# Patient Record
Sex: Male | Born: 1935 | Race: White | Hispanic: No | Marital: Married | Smoking: Former smoker
Health system: Southern US, Community
[De-identification: ages and names within clinical notes are randomized; demographics above are authoritative.]

## PROBLEM LIST (undated history)

## (undated) DIAGNOSIS — C3492 Malignant neoplasm of unspecified part of left bronchus or lung: Secondary | ICD-10-CM

## (undated) DIAGNOSIS — F0280 Dementia in other diseases classified elsewhere without behavioral disturbance: Secondary | ICD-10-CM

## (undated) DIAGNOSIS — H3530 Unspecified macular degeneration: Secondary | ICD-10-CM

## (undated) DIAGNOSIS — N183 Chronic kidney disease, stage 3 (moderate): Secondary | ICD-10-CM

## (undated) DIAGNOSIS — I319 Disease of pericardium, unspecified: Secondary | ICD-10-CM

## (undated) DIAGNOSIS — F17201 Nicotine dependence, unspecified, in remission: Secondary | ICD-10-CM

## (undated) DIAGNOSIS — Z86010 Personal history of colonic polyps: Secondary | ICD-10-CM

## (undated) DIAGNOSIS — I10 Essential (primary) hypertension: Secondary | ICD-10-CM

## (undated) DIAGNOSIS — F411 Generalized anxiety disorder: Secondary | ICD-10-CM

## (undated) DIAGNOSIS — G309 Alzheimer's disease, unspecified: Secondary | ICD-10-CM

## (undated) DIAGNOSIS — E1159 Type 2 diabetes mellitus with other circulatory complications: Secondary | ICD-10-CM

## (undated) DIAGNOSIS — E1122 Type 2 diabetes mellitus with diabetic chronic kidney disease: Secondary | ICD-10-CM

## (undated) DIAGNOSIS — C4490 Unspecified malignant neoplasm of skin, unspecified: Secondary | ICD-10-CM

## (undated) DIAGNOSIS — C4492 Squamous cell carcinoma of skin, unspecified: Secondary | ICD-10-CM

## (undated) DIAGNOSIS — C61 Malignant neoplasm of prostate: Secondary | ICD-10-CM

## (undated) DIAGNOSIS — I2510 Atherosclerotic heart disease of native coronary artery without angina pectoris: Secondary | ICD-10-CM

## (undated) DIAGNOSIS — I129 Hypertensive chronic kidney disease with stage 1 through stage 4 chronic kidney disease, or unspecified chronic kidney disease: Secondary | ICD-10-CM

## (undated) HISTORY — DX: Personal history of colonic polyps: Z86.010

## (undated) HISTORY — DX: Type 2 diabetes mellitus with diabetic chronic kidney disease: E11.22

## (undated) HISTORY — DX: Dementia in other diseases classified elsewhere, unspecified severity, without behavioral disturbance, psychotic disturbance, mood disturbance, and anxiety: F02.80

## (undated) HISTORY — PX: TONSILLECTOMY: SUR1361

## (undated) HISTORY — DX: Hypertensive chronic kidney disease with stage 1 through stage 4 chronic kidney disease, or unspecified chronic kidney disease: I12.9

## (undated) HISTORY — DX: Chronic kidney disease, stage 3 (moderate): N18.3

## (undated) HISTORY — DX: Generalized anxiety disorder: F41.1

## (undated) HISTORY — DX: Essential (primary) hypertension: I10

## (undated) HISTORY — DX: Unspecified malignant neoplasm of skin, unspecified: C44.90

## (undated) HISTORY — DX: Type 2 diabetes mellitus with other circulatory complications: E11.59

## (undated) HISTORY — DX: Unspecified macular degeneration: H35.30

## (undated) HISTORY — DX: Squamous cell carcinoma of skin, unspecified: C44.92

## (undated) HISTORY — DX: Malignant neoplasm of unspecified part of left bronchus or lung: C34.92

## (undated) HISTORY — PX: HERNIA REPAIR: SHX51

## (undated) HISTORY — DX: Nicotine dependence, unspecified, in remission: F17.201

## (undated) HISTORY — DX: Malignant neoplasm of prostate: C61

## (undated) HISTORY — DX: Alzheimer's disease, unspecified: G30.9

---

## 1999-04-05 DIAGNOSIS — C3492 Malignant neoplasm of unspecified part of left bronchus or lung: Secondary | ICD-10-CM

## 1999-04-05 HISTORY — DX: Malignant neoplasm of unspecified part of left bronchus or lung: C34.92

## 1999-04-05 HISTORY — PX: LOBECTOMY: SHX5089

## 1999-11-25 ENCOUNTER — Encounter: Payer: Self-pay | Admitting: Family Medicine

## 1999-11-25 ENCOUNTER — Encounter: Admission: RE | Admit: 1999-11-25 | Discharge: 1999-11-25 | Payer: Self-pay | Admitting: Family Medicine

## 1999-11-26 ENCOUNTER — Encounter: Payer: Self-pay | Admitting: Family Medicine

## 1999-11-26 ENCOUNTER — Encounter: Admission: RE | Admit: 1999-11-26 | Discharge: 1999-11-26 | Payer: Self-pay | Admitting: Family Medicine

## 1999-11-29 ENCOUNTER — Ambulatory Visit (HOSPITAL_COMMUNITY): Admission: RE | Admit: 1999-11-29 | Discharge: 1999-11-29 | Payer: Self-pay | Admitting: Gastroenterology

## 1999-11-29 ENCOUNTER — Encounter (INDEPENDENT_AMBULATORY_CARE_PROVIDER_SITE_OTHER): Payer: Self-pay | Admitting: *Deleted

## 1999-12-01 ENCOUNTER — Encounter: Admission: RE | Admit: 1999-12-01 | Discharge: 1999-12-01 | Payer: Self-pay | Admitting: Family Medicine

## 1999-12-01 ENCOUNTER — Encounter: Payer: Self-pay | Admitting: Family Medicine

## 1999-12-14 ENCOUNTER — Encounter (INDEPENDENT_AMBULATORY_CARE_PROVIDER_SITE_OTHER): Payer: Self-pay

## 1999-12-14 ENCOUNTER — Ambulatory Visit: Admission: RE | Admit: 1999-12-14 | Discharge: 1999-12-14 | Payer: Self-pay | Admitting: Internal Medicine

## 1999-12-21 ENCOUNTER — Encounter: Payer: Self-pay | Admitting: Family Medicine

## 1999-12-30 ENCOUNTER — Encounter: Payer: Self-pay | Admitting: Thoracic Surgery

## 1999-12-31 ENCOUNTER — Ambulatory Visit (HOSPITAL_COMMUNITY): Admission: RE | Admit: 1999-12-31 | Discharge: 1999-12-31 | Payer: Self-pay | Admitting: Thoracic Surgery

## 1999-12-31 ENCOUNTER — Encounter (INDEPENDENT_AMBULATORY_CARE_PROVIDER_SITE_OTHER): Payer: Self-pay | Admitting: *Deleted

## 2000-01-03 ENCOUNTER — Encounter: Payer: Self-pay | Admitting: Thoracic Surgery

## 2000-01-03 ENCOUNTER — Inpatient Hospital Stay (HOSPITAL_COMMUNITY): Admission: RE | Admit: 2000-01-03 | Discharge: 2000-01-10 | Payer: Self-pay | Admitting: Thoracic Surgery

## 2000-01-04 ENCOUNTER — Encounter: Payer: Self-pay | Admitting: Thoracic Surgery

## 2000-01-05 ENCOUNTER — Encounter: Payer: Self-pay | Admitting: Thoracic Surgery

## 2000-01-06 ENCOUNTER — Encounter: Payer: Self-pay | Admitting: Thoracic Surgery

## 2000-01-07 ENCOUNTER — Encounter: Payer: Self-pay | Admitting: Thoracic Surgery

## 2000-01-08 ENCOUNTER — Encounter: Payer: Self-pay | Admitting: Thoracic Surgery

## 2000-01-10 ENCOUNTER — Encounter: Payer: Self-pay | Admitting: Thoracic Surgery

## 2000-01-28 ENCOUNTER — Encounter: Payer: Self-pay | Admitting: Thoracic Surgery

## 2000-01-28 ENCOUNTER — Encounter: Admission: RE | Admit: 2000-01-28 | Discharge: 2000-01-28 | Payer: Self-pay | Admitting: Thoracic Surgery

## 2000-04-12 ENCOUNTER — Encounter: Payer: Self-pay | Admitting: Thoracic Surgery

## 2000-04-12 ENCOUNTER — Encounter: Admission: RE | Admit: 2000-04-12 | Discharge: 2000-04-12 | Payer: Self-pay | Admitting: Thoracic Surgery

## 2000-06-07 ENCOUNTER — Encounter: Admission: RE | Admit: 2000-06-07 | Discharge: 2000-06-07 | Payer: Self-pay | Admitting: Oncology

## 2000-06-07 ENCOUNTER — Encounter: Payer: Self-pay | Admitting: Oncology

## 2000-06-13 ENCOUNTER — Encounter: Payer: Self-pay | Admitting: Oncology

## 2000-06-13 ENCOUNTER — Ambulatory Visit (HOSPITAL_COMMUNITY): Admission: RE | Admit: 2000-06-13 | Discharge: 2000-06-13 | Payer: Self-pay | Admitting: Oncology

## 2000-07-11 ENCOUNTER — Encounter: Payer: Self-pay | Admitting: Thoracic Surgery

## 2000-07-11 ENCOUNTER — Encounter: Admission: RE | Admit: 2000-07-11 | Discharge: 2000-07-11 | Payer: Self-pay | Admitting: Thoracic Surgery

## 2000-10-11 ENCOUNTER — Encounter: Payer: Self-pay | Admitting: Thoracic Surgery

## 2000-10-11 ENCOUNTER — Encounter: Admission: RE | Admit: 2000-10-11 | Discharge: 2000-10-11 | Payer: Self-pay | Admitting: Thoracic Surgery

## 2000-12-05 ENCOUNTER — Encounter: Admission: RE | Admit: 2000-12-05 | Discharge: 2000-12-05 | Payer: Self-pay | Admitting: Oncology

## 2000-12-05 ENCOUNTER — Encounter: Payer: Self-pay | Admitting: Oncology

## 2001-02-13 ENCOUNTER — Encounter: Admission: RE | Admit: 2001-02-13 | Discharge: 2001-02-13 | Payer: Self-pay | Admitting: Thoracic Surgery

## 2001-02-13 ENCOUNTER — Encounter: Payer: Self-pay | Admitting: Thoracic Surgery

## 2001-03-30 ENCOUNTER — Encounter: Payer: Self-pay | Admitting: Family Medicine

## 2001-04-04 DIAGNOSIS — C61 Malignant neoplasm of prostate: Secondary | ICD-10-CM

## 2001-04-04 HISTORY — DX: Malignant neoplasm of prostate: C61

## 2001-04-04 HISTORY — PX: PROSTATECTOMY: SHX69

## 2001-04-11 ENCOUNTER — Encounter (INDEPENDENT_AMBULATORY_CARE_PROVIDER_SITE_OTHER): Payer: Self-pay | Admitting: Specialist

## 2001-04-11 ENCOUNTER — Inpatient Hospital Stay (HOSPITAL_COMMUNITY): Admission: RE | Admit: 2001-04-11 | Discharge: 2001-04-14 | Payer: Self-pay | Admitting: Urology

## 2001-06-06 ENCOUNTER — Encounter: Payer: Self-pay | Admitting: Oncology

## 2001-06-06 ENCOUNTER — Encounter: Admission: RE | Admit: 2001-06-06 | Discharge: 2001-06-06 | Payer: Self-pay | Admitting: Oncology

## 2001-06-26 ENCOUNTER — Ambulatory Visit (HOSPITAL_BASED_OUTPATIENT_CLINIC_OR_DEPARTMENT_OTHER): Admission: RE | Admit: 2001-06-26 | Discharge: 2001-06-26 | Payer: Self-pay | Admitting: Surgery

## 2001-08-15 ENCOUNTER — Encounter: Admission: RE | Admit: 2001-08-15 | Discharge: 2001-08-15 | Payer: Self-pay | Admitting: Thoracic Surgery

## 2001-08-15 ENCOUNTER — Encounter: Payer: Self-pay | Admitting: Thoracic Surgery

## 2001-09-18 ENCOUNTER — Encounter: Payer: Self-pay | Admitting: Urology

## 2001-09-18 ENCOUNTER — Encounter: Payer: Self-pay | Admitting: Family Medicine

## 2001-09-18 ENCOUNTER — Ambulatory Visit (HOSPITAL_BASED_OUTPATIENT_CLINIC_OR_DEPARTMENT_OTHER): Admission: RE | Admit: 2001-09-18 | Discharge: 2001-09-18 | Payer: Self-pay | Admitting: Urology

## 2001-12-05 ENCOUNTER — Encounter: Admission: RE | Admit: 2001-12-05 | Discharge: 2001-12-05 | Payer: Self-pay | Admitting: Oncology

## 2001-12-05 ENCOUNTER — Encounter: Payer: Self-pay | Admitting: Oncology

## 2002-06-10 ENCOUNTER — Encounter: Admission: RE | Admit: 2002-06-10 | Discharge: 2002-06-10 | Payer: Self-pay | Admitting: Oncology

## 2002-06-10 ENCOUNTER — Encounter: Payer: Self-pay | Admitting: Oncology

## 2002-12-10 ENCOUNTER — Encounter: Payer: Self-pay | Admitting: Oncology

## 2002-12-10 ENCOUNTER — Encounter: Admission: RE | Admit: 2002-12-10 | Discharge: 2002-12-10 | Payer: Self-pay | Admitting: Oncology

## 2002-12-31 ENCOUNTER — Encounter: Payer: Self-pay | Admitting: Thoracic Surgery

## 2002-12-31 ENCOUNTER — Encounter: Admission: RE | Admit: 2002-12-31 | Discharge: 2002-12-31 | Payer: Self-pay | Admitting: Thoracic Surgery

## 2003-01-08 ENCOUNTER — Encounter: Admission: RE | Admit: 2003-01-08 | Discharge: 2003-01-08 | Payer: Self-pay | Admitting: Thoracic Surgery

## 2003-01-08 ENCOUNTER — Encounter: Payer: Self-pay | Admitting: Thoracic Surgery

## 2003-06-03 ENCOUNTER — Ambulatory Visit (HOSPITAL_COMMUNITY): Admission: RE | Admit: 2003-06-03 | Discharge: 2003-06-03 | Payer: Self-pay | Admitting: Oncology

## 2003-07-09 ENCOUNTER — Encounter: Admission: RE | Admit: 2003-07-09 | Discharge: 2003-07-09 | Payer: Self-pay | Admitting: Thoracic Surgery

## 2003-12-03 ENCOUNTER — Encounter: Admission: RE | Admit: 2003-12-03 | Discharge: 2003-12-03 | Payer: Self-pay | Admitting: Oncology

## 2004-02-05 ENCOUNTER — Encounter: Admission: RE | Admit: 2004-02-05 | Discharge: 2004-02-05 | Payer: Self-pay | Admitting: Thoracic Surgery

## 2004-02-16 ENCOUNTER — Ambulatory Visit: Payer: Self-pay | Admitting: Family Medicine

## 2004-06-07 ENCOUNTER — Ambulatory Visit: Payer: Self-pay | Admitting: Oncology

## 2004-06-08 ENCOUNTER — Encounter: Admission: RE | Admit: 2004-06-08 | Discharge: 2004-06-08 | Payer: Self-pay | Admitting: Oncology

## 2004-06-28 ENCOUNTER — Ambulatory Visit: Payer: Self-pay | Admitting: Family Medicine

## 2004-08-04 ENCOUNTER — Encounter: Admission: RE | Admit: 2004-08-04 | Discharge: 2004-08-04 | Payer: Self-pay | Admitting: Thoracic Surgery

## 2004-12-03 ENCOUNTER — Ambulatory Visit: Payer: Self-pay | Admitting: Oncology

## 2004-12-07 ENCOUNTER — Ambulatory Visit (HOSPITAL_COMMUNITY): Admission: RE | Admit: 2004-12-07 | Discharge: 2004-12-07 | Payer: Self-pay | Admitting: Oncology

## 2005-02-16 ENCOUNTER — Encounter: Admission: RE | Admit: 2005-02-16 | Discharge: 2005-02-16 | Payer: Self-pay | Admitting: Thoracic Surgery

## 2005-04-12 DIAGNOSIS — D099 Carcinoma in situ, unspecified: Secondary | ICD-10-CM

## 2005-04-12 DIAGNOSIS — C060 Malignant neoplasm of cheek mucosa: Secondary | ICD-10-CM

## 2005-04-12 DIAGNOSIS — C4492 Squamous cell carcinoma of skin, unspecified: Secondary | ICD-10-CM

## 2005-04-12 HISTORY — DX: Carcinoma in situ, unspecified: D09.9

## 2005-04-12 HISTORY — DX: Malignant neoplasm of cheek mucosa: C06.0

## 2005-04-12 HISTORY — DX: Squamous cell carcinoma of skin, unspecified: C44.92

## 2005-05-04 ENCOUNTER — Ambulatory Visit: Payer: Self-pay | Admitting: Family Medicine

## 2005-06-06 ENCOUNTER — Ambulatory Visit: Payer: Self-pay | Admitting: Oncology

## 2005-06-07 ENCOUNTER — Ambulatory Visit (HOSPITAL_COMMUNITY): Admission: RE | Admit: 2005-06-07 | Discharge: 2005-06-07 | Payer: Self-pay | Admitting: Oncology

## 2005-09-27 DIAGNOSIS — C4492 Squamous cell carcinoma of skin, unspecified: Secondary | ICD-10-CM

## 2005-09-27 HISTORY — DX: Squamous cell carcinoma of skin, unspecified: C44.92

## 2005-12-02 ENCOUNTER — Ambulatory Visit: Payer: Self-pay | Admitting: Oncology

## 2005-12-07 ENCOUNTER — Ambulatory Visit (HOSPITAL_COMMUNITY): Admission: RE | Admit: 2005-12-07 | Discharge: 2005-12-07 | Payer: Self-pay | Admitting: Oncology

## 2005-12-07 LAB — COMPREHENSIVE METABOLIC PANEL
ALT: 11 U/L (ref 0–40)
AST: 15 U/L (ref 0–37)
Albumin: 4 g/dL (ref 3.5–5.2)
Alkaline Phosphatase: 77 U/L (ref 39–117)
BUN: 18 mg/dL (ref 6–23)
CO2: 26 mEq/L (ref 19–32)
Calcium: 9.1 mg/dL (ref 8.4–10.5)
Chloride: 104 mEq/L (ref 96–112)
Creatinine, Ser: 1.35 mg/dL (ref 0.40–1.50)
Glucose, Bld: 127 mg/dL — ABNORMAL HIGH (ref 70–99)
Potassium: 4.3 mEq/L (ref 3.5–5.3)
Sodium: 138 mEq/L (ref 135–145)
Total Bilirubin: 0.5 mg/dL (ref 0.3–1.2)
Total Protein: 6.8 g/dL (ref 6.0–8.3)

## 2005-12-07 LAB — CBC WITH DIFFERENTIAL/PLATELET
BASO%: 0.4 % (ref 0.0–2.0)
Basophils Absolute: 0 10*3/uL (ref 0.0–0.1)
EOS%: 1.5 % (ref 0.0–7.0)
Eosinophils Absolute: 0.1 10*3/uL (ref 0.0–0.5)
HCT: 41.5 % (ref 38.7–49.9)
HGB: 14.4 g/dL (ref 13.0–17.1)
LYMPH%: 13.2 % — ABNORMAL LOW (ref 14.0–48.0)
MCH: 33 pg (ref 28.0–33.4)
MCHC: 34.7 g/dL (ref 32.0–35.9)
MCV: 95.3 fL (ref 81.6–98.0)
MONO#: 0.5 10*3/uL (ref 0.1–0.9)
MONO%: 6.5 % (ref 0.0–13.0)
NEUT#: 6.3 10*3/uL (ref 1.5–6.5)
NEUT%: 78.4 % — ABNORMAL HIGH (ref 40.0–75.0)
Platelets: 176 10*3/uL (ref 145–400)
RBC: 4.36 10*6/uL (ref 4.20–5.71)
RDW: 12.6 % (ref 11.2–14.6)
WBC: 8 10*3/uL (ref 4.0–10.0)
lymph#: 1.1 10*3/uL (ref 0.9–3.3)

## 2005-12-07 LAB — PSA: PSA: 0.04 ng/mL — ABNORMAL LOW (ref 0.10–4.00)

## 2005-12-07 LAB — LACTATE DEHYDROGENASE: LDH: 136 U/L (ref 94–250)

## 2005-12-13 ENCOUNTER — Encounter: Payer: Self-pay | Admitting: Family Medicine

## 2006-01-24 ENCOUNTER — Ambulatory Visit: Payer: Self-pay | Admitting: Family Medicine

## 2006-04-27 ENCOUNTER — Encounter: Payer: Self-pay | Admitting: Family Medicine

## 2006-04-27 LAB — HM COLONOSCOPY

## 2006-05-31 ENCOUNTER — Ambulatory Visit: Payer: Self-pay | Admitting: Family Medicine

## 2006-06-01 ENCOUNTER — Ambulatory Visit: Payer: Self-pay | Admitting: Oncology

## 2006-06-06 ENCOUNTER — Ambulatory Visit (HOSPITAL_COMMUNITY): Admission: RE | Admit: 2006-06-06 | Discharge: 2006-06-06 | Payer: Self-pay | Admitting: Oncology

## 2006-06-06 LAB — COMPREHENSIVE METABOLIC PANEL
ALT: 10 U/L (ref 0–53)
AST: 13 U/L (ref 0–37)
Albumin: 3.7 g/dL (ref 3.5–5.2)
Alkaline Phosphatase: 75 U/L (ref 39–117)
BUN: 13 mg/dL (ref 6–23)
CO2: 26 mEq/L (ref 19–32)
Calcium: 8.8 mg/dL (ref 8.4–10.5)
Chloride: 105 mEq/L (ref 96–112)
Creatinine, Ser: 1.19 mg/dL (ref 0.40–1.50)
Glucose, Bld: 133 mg/dL — ABNORMAL HIGH (ref 70–99)
Potassium: 4.1 mEq/L (ref 3.5–5.3)
Sodium: 141 mEq/L (ref 135–145)
Total Bilirubin: 0.3 mg/dL (ref 0.3–1.2)
Total Protein: 6.4 g/dL (ref 6.0–8.3)

## 2006-06-06 LAB — CBC WITH DIFFERENTIAL/PLATELET
BASO%: 0.5 % (ref 0.0–2.0)
Basophils Absolute: 0 10*3/uL (ref 0.0–0.1)
EOS%: 0.9 % (ref 0.0–7.0)
Eosinophils Absolute: 0 10*3/uL (ref 0.0–0.5)
HCT: 38.5 % — ABNORMAL LOW (ref 38.7–49.9)
HGB: 13.5 g/dL (ref 13.0–17.1)
LYMPH%: 18 % (ref 14.0–48.0)
MCH: 32.1 pg (ref 28.0–33.4)
MCHC: 35.1 g/dL (ref 32.0–35.9)
MCV: 91.6 fL (ref 81.6–98.0)
MONO#: 0.4 10*3/uL (ref 0.1–0.9)
MONO%: 8.1 % (ref 0.0–13.0)
NEUT#: 3.2 10*3/uL (ref 1.5–6.5)
NEUT%: 72.5 % (ref 40.0–75.0)
Platelets: 138 10*3/uL — ABNORMAL LOW (ref 145–400)
RBC: 4.2 10*6/uL (ref 4.20–5.71)
RDW: 12.7 % (ref 11.2–14.6)
WBC: 4.5 10*3/uL (ref 4.0–10.0)
lymph#: 0.8 10*3/uL — ABNORMAL LOW (ref 0.9–3.3)

## 2006-06-06 LAB — PSA: PSA: 0.03 ng/mL — ABNORMAL LOW (ref 0.10–4.00)

## 2006-06-06 LAB — LACTATE DEHYDROGENASE: LDH: 116 U/L (ref 94–250)

## 2007-01-08 ENCOUNTER — Ambulatory Visit: Payer: Self-pay | Admitting: Family Medicine

## 2007-01-08 DIAGNOSIS — Z85118 Personal history of other malignant neoplasm of bronchus and lung: Secondary | ICD-10-CM | POA: Insufficient documentation

## 2007-01-08 DIAGNOSIS — F411 Generalized anxiety disorder: Secondary | ICD-10-CM | POA: Insufficient documentation

## 2007-01-08 DIAGNOSIS — F0280 Dementia in other diseases classified elsewhere without behavioral disturbance: Secondary | ICD-10-CM | POA: Insufficient documentation

## 2007-01-08 DIAGNOSIS — I10 Essential (primary) hypertension: Secondary | ICD-10-CM | POA: Insufficient documentation

## 2007-01-08 DIAGNOSIS — G309 Alzheimer's disease, unspecified: Secondary | ICD-10-CM

## 2007-01-08 DIAGNOSIS — C4490 Unspecified malignant neoplasm of skin, unspecified: Secondary | ICD-10-CM | POA: Insufficient documentation

## 2007-01-08 DIAGNOSIS — L0390 Cellulitis, unspecified: Secondary | ICD-10-CM

## 2007-01-08 DIAGNOSIS — L0291 Cutaneous abscess, unspecified: Secondary | ICD-10-CM | POA: Insufficient documentation

## 2007-01-08 DIAGNOSIS — C61 Malignant neoplasm of prostate: Secondary | ICD-10-CM | POA: Insufficient documentation

## 2007-01-08 HISTORY — DX: Generalized anxiety disorder: F41.1

## 2007-04-09 ENCOUNTER — Ambulatory Visit: Payer: Self-pay | Admitting: Family Medicine

## 2007-04-17 ENCOUNTER — Ambulatory Visit: Payer: Self-pay | Admitting: Family Medicine

## 2007-04-18 ENCOUNTER — Encounter: Payer: Self-pay | Admitting: Family Medicine

## 2007-04-19 LAB — CONVERTED CEMR LAB
ALT: 15 units/L (ref 0–53)
AST: 15 units/L (ref 0–37)
Albumin: 3.7 g/dL (ref 3.5–5.2)
Alkaline Phosphatase: 72 units/L (ref 39–117)
BUN: 13 mg/dL (ref 6–23)
Bilirubin, Direct: 0.1 mg/dL (ref 0.0–0.3)
CO2: 33 meq/L — ABNORMAL HIGH (ref 19–32)
Calcium: 9.4 mg/dL (ref 8.4–10.5)
Chloride: 101 meq/L (ref 96–112)
Cholesterol: 242 mg/dL (ref 0–200)
Creatinine, Ser: 1.3 mg/dL (ref 0.4–1.5)
Direct LDL: 160.9 mg/dL
GFR calc Af Amer: 69 mL/min
GFR calc non Af Amer: 57 mL/min
Glucose, Bld: 172 mg/dL — ABNORMAL HIGH (ref 70–99)
HDL: 25.9 mg/dL — ABNORMAL LOW (ref >39.0)
Potassium: 4.8 meq/L (ref 3.5–5.1)
Sodium: 140 meq/L (ref 135–145)
Total Bilirubin: 1.2 mg/dL (ref 0.3–1.2)
Total CHOL/HDL Ratio: 9.3
Total Protein: 6.7 g/dL (ref 6.0–8.3)
Triglycerides: 303 mg/dL (ref 0–149)
VLDL: 61 mg/dL — ABNORMAL HIGH (ref 0–40)

## 2007-05-21 ENCOUNTER — Ambulatory Visit: Payer: Self-pay | Admitting: Family Medicine

## 2007-05-21 DIAGNOSIS — E7800 Pure hypercholesterolemia, unspecified: Secondary | ICD-10-CM | POA: Insufficient documentation

## 2007-05-21 DIAGNOSIS — E1159 Type 2 diabetes mellitus with other circulatory complications: Secondary | ICD-10-CM | POA: Insufficient documentation

## 2007-05-21 HISTORY — DX: Type 2 diabetes mellitus with other circulatory complications: E11.59

## 2007-05-21 LAB — CONVERTED CEMR LAB
Creatinine,U: 18.2 mg/dL
Hgb A1c MFr Bld: 6.8 % — ABNORMAL HIGH (ref 4.6–6.0)
Microalb Creat Ratio: 33 mg/g — ABNORMAL HIGH (ref 0.0–30.0)
Microalb, Ur: 0.6 mg/dL (ref 0.0–1.9)

## 2007-06-14 ENCOUNTER — Ambulatory Visit: Payer: Self-pay | Admitting: Oncology

## 2007-06-18 ENCOUNTER — Encounter: Payer: Self-pay | Admitting: Family Medicine

## 2007-06-18 ENCOUNTER — Ambulatory Visit (HOSPITAL_COMMUNITY): Admission: RE | Admit: 2007-06-18 | Discharge: 2007-06-18 | Payer: Self-pay | Admitting: Oncology

## 2007-06-18 LAB — CBC WITH DIFFERENTIAL/PLATELET
BASO%: 0.1 % (ref 0.0–2.0)
Basophils Absolute: 0 10*3/uL (ref 0.0–0.1)
EOS%: 1 % (ref 0.0–7.0)
Eosinophils Absolute: 0.1 10*3/uL (ref 0.0–0.5)
HCT: 41.1 % (ref 38.7–49.9)
HGB: 14.6 g/dL (ref 13.0–17.1)
LYMPH%: 13.3 % — ABNORMAL LOW (ref 14.0–48.0)
MCH: 32.9 pg (ref 28.0–33.4)
MCHC: 35.7 g/dL (ref 32.0–35.9)
MCV: 92.2 fL (ref 81.6–98.0)
MONO#: 0.4 10*3/uL (ref 0.1–0.9)
MONO%: 5.9 % (ref 0.0–13.0)
NEUT#: 5.9 10*3/uL (ref 1.5–6.5)
NEUT%: 79.7 % — ABNORMAL HIGH (ref 40.0–75.0)
Platelets: 175 10*3/uL (ref 145–400)
RBC: 4.46 10*6/uL (ref 4.20–5.71)
RDW: 12.6 % (ref 11.2–14.6)
WBC: 7.5 10*3/uL (ref 4.0–10.0)
lymph#: 1 10*3/uL (ref 0.9–3.3)

## 2007-06-19 LAB — COMPREHENSIVE METABOLIC PANEL
ALT: 15 U/L (ref 0–53)
AST: 12 U/L (ref 0–37)
Albumin: 4.4 g/dL (ref 3.5–5.2)
Alkaline Phosphatase: 83 U/L (ref 39–117)
BUN: 15 mg/dL (ref 6–23)
CO2: 29 mEq/L (ref 19–32)
Calcium: 8.9 mg/dL (ref 8.4–10.5)
Chloride: 99 mEq/L (ref 96–112)
Creatinine, Ser: 1.22 mg/dL (ref 0.40–1.50)
Glucose, Bld: 150 mg/dL — ABNORMAL HIGH (ref 70–99)
Potassium: 4.6 mEq/L (ref 3.5–5.3)
Sodium: 138 mEq/L (ref 135–145)
Total Bilirubin: 0.6 mg/dL (ref 0.3–1.2)
Total Protein: 7 g/dL (ref 6.0–8.3)

## 2007-06-19 LAB — PSA: PSA: 0.02 ng/mL — ABNORMAL LOW (ref 0.10–4.00)

## 2007-06-19 LAB — LACTATE DEHYDROGENASE: LDH: 130 U/L (ref 94–250)

## 2007-06-25 ENCOUNTER — Encounter: Payer: Self-pay | Admitting: Family Medicine

## 2007-06-28 ENCOUNTER — Encounter: Payer: Self-pay | Admitting: Family Medicine

## 2007-08-10 ENCOUNTER — Emergency Department (HOSPITAL_COMMUNITY): Admission: EM | Admit: 2007-08-10 | Discharge: 2007-08-10 | Payer: Self-pay | Admitting: Family Medicine

## 2007-08-12 ENCOUNTER — Emergency Department (HOSPITAL_COMMUNITY): Admission: EM | Admit: 2007-08-12 | Discharge: 2007-08-12 | Payer: Self-pay | Admitting: Emergency Medicine

## 2007-08-15 ENCOUNTER — Emergency Department (HOSPITAL_COMMUNITY): Admission: EM | Admit: 2007-08-15 | Discharge: 2007-08-15 | Payer: Self-pay | Admitting: Emergency Medicine

## 2007-08-17 ENCOUNTER — Ambulatory Visit: Payer: Self-pay | Admitting: Family Medicine

## 2007-08-21 ENCOUNTER — Ambulatory Visit: Payer: Self-pay | Admitting: Family Medicine

## 2007-08-23 ENCOUNTER — Ambulatory Visit: Payer: Self-pay | Admitting: Family Medicine

## 2007-08-28 LAB — CONVERTED CEMR LAB
ALT: 16 units/L (ref 0–53)
AST: 18 units/L (ref 0–37)
Albumin: 3.5 g/dL (ref 3.5–5.2)
Alkaline Phosphatase: 65 units/L (ref 39–117)
Bilirubin, Direct: 0.1 mg/dL (ref 0.0–0.3)
Cholesterol: 219 mg/dL (ref 0–200)
Direct LDL: 149.8 mg/dL
HDL: 29.8 mg/dL — ABNORMAL LOW (ref >39.0)
Hgb A1c MFr Bld: 7 % — ABNORMAL HIGH (ref 4.6–6.0)
Total Bilirubin: 0.7 mg/dL (ref 0.3–1.2)
Total CHOL/HDL Ratio: 7.3
Total Protein: 6.6 g/dL (ref 6.0–8.3)
Triglycerides: 184 mg/dL — ABNORMAL HIGH (ref 0–149)
VLDL: 37 mg/dL (ref 0–40)

## 2007-09-20 ENCOUNTER — Ambulatory Visit: Payer: Self-pay | Admitting: Family Medicine

## 2007-09-20 DIAGNOSIS — R109 Unspecified abdominal pain: Secondary | ICD-10-CM | POA: Insufficient documentation

## 2007-09-20 LAB — CONVERTED CEMR LAB
Bilirubin Urine: NEGATIVE
Glucose, Urine, Semiquant: NEGATIVE
Ketones, urine, test strip: NEGATIVE
Nitrite: NEGATIVE
Specific Gravity, Urine: 1.025
Urobilinogen, UA: 0.2
WBC Urine, dipstick: NEGATIVE
pH: 6

## 2007-09-21 ENCOUNTER — Encounter: Payer: Self-pay | Admitting: Family Medicine

## 2007-09-21 ENCOUNTER — Ambulatory Visit: Payer: Self-pay | Admitting: Internal Medicine

## 2007-10-02 ENCOUNTER — Ambulatory Visit (HOSPITAL_COMMUNITY): Admission: RE | Admit: 2007-10-02 | Discharge: 2007-10-02 | Payer: Self-pay | Admitting: Urology

## 2007-11-22 ENCOUNTER — Ambulatory Visit: Payer: Self-pay | Admitting: Family Medicine

## 2007-11-23 LAB — CONVERTED CEMR LAB
ALT: 14 units/L (ref 0–53)
AST: 17 units/L (ref 0–37)
Albumin: 3.7 g/dL (ref 3.5–5.2)
Alkaline Phosphatase: 74 units/L (ref 39–117)
Bilirubin, Direct: 0.1 mg/dL (ref 0.0–0.3)
Cholesterol: 229 mg/dL (ref 0–200)
Direct LDL: 149.3 mg/dL
HDL: 27.9 mg/dL — ABNORMAL LOW (ref >39.0)
Hgb A1c MFr Bld: 6.8 % — ABNORMAL HIGH (ref 4.6–6.0)
Total Bilirubin: 0.8 mg/dL (ref 0.3–1.2)
Total CHOL/HDL Ratio: 8.2
Total Protein: 6.8 g/dL (ref 6.0–8.3)
Triglycerides: 266 mg/dL (ref 0–149)
VLDL: 53 mg/dL — ABNORMAL HIGH (ref 0–40)

## 2007-11-29 ENCOUNTER — Ambulatory Visit: Payer: Self-pay | Admitting: Family Medicine

## 2007-11-29 LAB — HM DIABETES FOOT EXAM

## 2008-02-25 ENCOUNTER — Encounter (INDEPENDENT_AMBULATORY_CARE_PROVIDER_SITE_OTHER): Payer: Self-pay | Admitting: *Deleted

## 2008-04-03 ENCOUNTER — Telehealth: Payer: Self-pay | Admitting: Family Medicine

## 2008-04-07 ENCOUNTER — Telehealth: Payer: Self-pay | Admitting: Family Medicine

## 2008-04-14 ENCOUNTER — Telehealth: Payer: Self-pay | Admitting: Family Medicine

## 2008-06-13 ENCOUNTER — Ambulatory Visit: Payer: Self-pay | Admitting: Oncology

## 2008-06-17 ENCOUNTER — Encounter: Payer: Self-pay | Admitting: Family Medicine

## 2008-06-17 ENCOUNTER — Ambulatory Visit (HOSPITAL_COMMUNITY): Admission: RE | Admit: 2008-06-17 | Discharge: 2008-06-17 | Payer: Self-pay | Admitting: Oncology

## 2008-06-17 LAB — COMPREHENSIVE METABOLIC PANEL
ALT: 12 U/L (ref 0–53)
AST: 12 U/L (ref 0–37)
Albumin: 3.9 g/dL (ref 3.5–5.2)
Alkaline Phosphatase: 77 U/L (ref 39–117)
BUN: 13 mg/dL (ref 6–23)
CO2: 26 mEq/L (ref 19–32)
Calcium: 8.9 mg/dL (ref 8.4–10.5)
Chloride: 99 mEq/L (ref 96–112)
Creatinine, Ser: 1.24 mg/dL (ref 0.40–1.50)
Glucose, Bld: 248 mg/dL — ABNORMAL HIGH (ref 70–99)
Potassium: 4.3 mEq/L (ref 3.5–5.3)
Sodium: 136 mEq/L (ref 135–145)
Total Bilirubin: 0.4 mg/dL (ref 0.3–1.2)
Total Protein: 6.7 g/dL (ref 6.0–8.3)

## 2008-06-17 LAB — CBC WITH DIFFERENTIAL/PLATELET
BASO%: 0.2 % (ref 0.0–2.0)
Basophils Absolute: 0 10*3/uL (ref 0.0–0.1)
EOS%: 1.4 % (ref 0.0–7.0)
Eosinophils Absolute: 0.1 10*3/uL (ref 0.0–0.5)
HCT: 42.5 % (ref 38.4–49.9)
HGB: 14.7 g/dL (ref 13.0–17.1)
LYMPH%: 14.5 % (ref 14.0–49.0)
MCH: 32.7 pg (ref 27.2–33.4)
MCHC: 34.5 g/dL (ref 32.0–36.0)
MCV: 94.7 fL (ref 79.3–98.0)
MONO#: 0.5 10*3/uL (ref 0.1–0.9)
MONO%: 7.6 % (ref 0.0–14.0)
NEUT#: 4.9 10*3/uL (ref 1.5–6.5)
NEUT%: 76.3 % — ABNORMAL HIGH (ref 39.0–75.0)
Platelets: 156 10*3/uL (ref 140–400)
RBC: 4.48 10*6/uL (ref 4.20–5.82)
RDW: 12.8 % (ref 11.0–14.6)
WBC: 6.4 10*3/uL (ref 4.0–10.3)
lymph#: 0.9 10*3/uL (ref 0.9–3.3)

## 2008-06-17 LAB — LACTATE DEHYDROGENASE: LDH: 142 U/L (ref 94–250)

## 2008-06-17 LAB — PSA: PSA: 0.01 ng/mL — ABNORMAL LOW (ref 0.10–4.00)

## 2008-06-23 ENCOUNTER — Encounter: Payer: Self-pay | Admitting: Family Medicine

## 2008-07-22 ENCOUNTER — Ambulatory Visit: Payer: Self-pay | Admitting: Family Medicine

## 2008-07-22 DIAGNOSIS — R42 Dizziness and giddiness: Secondary | ICD-10-CM | POA: Insufficient documentation

## 2008-07-29 ENCOUNTER — Encounter: Payer: Self-pay | Admitting: Family Medicine

## 2009-03-10 ENCOUNTER — Telehealth: Payer: Self-pay | Admitting: Family Medicine

## 2009-06-03 ENCOUNTER — Encounter: Payer: Self-pay | Admitting: Family Medicine

## 2009-06-11 ENCOUNTER — Ambulatory Visit: Payer: Self-pay | Admitting: Oncology

## 2009-06-15 ENCOUNTER — Ambulatory Visit (HOSPITAL_COMMUNITY): Admission: RE | Admit: 2009-06-15 | Discharge: 2009-06-15 | Payer: Self-pay | Admitting: Oncology

## 2009-06-15 ENCOUNTER — Encounter: Payer: Self-pay | Admitting: Family Medicine

## 2009-06-15 LAB — CBC WITH DIFFERENTIAL/PLATELET
BASO%: 0.3 % (ref 0.0–2.0)
Basophils Absolute: 0 10*3/uL (ref 0.0–0.1)
EOS%: 1.3 % (ref 0.0–7.0)
Eosinophils Absolute: 0.1 10*3/uL (ref 0.0–0.5)
HCT: 38.9 % (ref 38.4–49.9)
HGB: 13.6 g/dL (ref 13.0–17.1)
LYMPH%: 11.7 % — ABNORMAL LOW (ref 14.0–49.0)
MCH: 33.4 pg (ref 27.2–33.4)
MCHC: 35 g/dL (ref 32.0–36.0)
MCV: 95.3 fL (ref 79.3–98.0)
MONO#: 0.5 10*3/uL (ref 0.1–0.9)
MONO%: 7.1 % (ref 0.0–14.0)
NEUT#: 5.8 10*3/uL (ref 1.5–6.5)
NEUT%: 79.6 % — ABNORMAL HIGH (ref 39.0–75.0)
Platelets: 155 10*3/uL (ref 140–400)
RBC: 4.08 10*6/uL — ABNORMAL LOW (ref 4.20–5.82)
RDW: 12.6 % (ref 11.0–14.6)
WBC: 7.2 10*3/uL (ref 4.0–10.3)
lymph#: 0.8 10*3/uL — ABNORMAL LOW (ref 0.9–3.3)

## 2009-06-15 LAB — COMPREHENSIVE METABOLIC PANEL
ALT: 10 U/L (ref 0–53)
AST: 11 U/L (ref 0–37)
Albumin: 3.8 g/dL (ref 3.5–5.2)
Alkaline Phosphatase: 70 U/L (ref 39–117)
BUN: 13 mg/dL (ref 6–23)
CO2: 26 mEq/L (ref 19–32)
Calcium: 8.4 mg/dL (ref 8.4–10.5)
Chloride: 102 mEq/L (ref 96–112)
Creatinine, Ser: 1.12 mg/dL (ref 0.40–1.50)
Glucose, Bld: 226 mg/dL — ABNORMAL HIGH (ref 70–99)
Potassium: 4.3 mEq/L (ref 3.5–5.3)
Sodium: 137 mEq/L (ref 135–145)
Total Bilirubin: 0.5 mg/dL (ref 0.3–1.2)
Total Protein: 6.4 g/dL (ref 6.0–8.3)

## 2009-06-15 LAB — PSA: PSA: <0.01 ng/mL — ABNORMAL LOW (ref 0.10–4.00)

## 2009-06-15 LAB — LACTATE DEHYDROGENASE: LDH: 104 U/L (ref 94–250)

## 2009-06-22 ENCOUNTER — Encounter: Payer: Self-pay | Admitting: Family Medicine

## 2009-06-24 ENCOUNTER — Encounter (INDEPENDENT_AMBULATORY_CARE_PROVIDER_SITE_OTHER): Payer: Self-pay | Admitting: *Deleted

## 2009-06-24 LAB — CONVERTED CEMR LAB
ALT: 10 units/L
AST: 11 units/L
Albumin: 3.8 g/dL
Alkaline Phosphatase: 70 units/L
BUN: 13 mg/dL
Calcium: 8.4 mg/dL
Chloride: 102 meq/L
Creatinine, Ser: 1.12 mg/dL
Glucose, Bld: 226 mg/dL
PSA: 0.01 ng/mL
Potassium: 4.3 meq/L
Sodium: 137 meq/L
Total Bilirubin: 0.5 mg/dL
Total Protein: 6.4 g/dL

## 2009-08-07 ENCOUNTER — Telehealth: Payer: Self-pay | Admitting: Family Medicine

## 2010-02-02 ENCOUNTER — Ambulatory Visit: Payer: Self-pay | Admitting: Family Medicine

## 2010-02-02 LAB — CONVERTED CEMR LAB
Bilirubin Urine: NEGATIVE
Casts: 0 /lpf
Epithelial cells, urine: 0 /lpf
Glucose, Urine, Semiquant: NEGATIVE
Ketones, urine, test strip: NEGATIVE
Nitrite: NEGATIVE
Protein, U semiquant: NEGATIVE
Specific Gravity, Urine: 1.02
Urine crystals, microscopic: 0 /hpf
Urobilinogen, UA: 0.2
WBC Urine, dipstick: NEGATIVE
WBC, UA: 0 cells/hpf
pH: 6.5

## 2010-02-04 LAB — CONVERTED CEMR LAB
ALT: 16 units/L (ref 0–53)
AST: 21 units/L (ref 0–37)
Albumin: 3.9 g/dL (ref 3.5–5.2)
Alkaline Phosphatase: 76 units/L (ref 39–117)
BUN: 13 mg/dL (ref 6–23)
Basophils Absolute: 0.1 10*3/uL (ref 0.0–0.1)
Basophils Relative: 0.8 % (ref 0.0–3.0)
Bilirubin, Direct: 0.1 mg/dL (ref 0.0–0.3)
CO2: 29 meq/L (ref 19–32)
Calcium: 9.1 mg/dL (ref 8.4–10.5)
Chloride: 103 meq/L (ref 96–112)
Creatinine, Ser: 1.1 mg/dL (ref 0.4–1.5)
Eosinophils Absolute: 0.1 10*3/uL (ref 0.0–0.7)
Eosinophils Relative: 1.2 % (ref 0.0–5.0)
GFR calc non Af Amer: 66.83 mL/min (ref >60)
Glucose, Bld: 147 mg/dL — ABNORMAL HIGH (ref 70–99)
HCT: 40.5 % (ref 39.0–52.0)
Hemoglobin: 14.2 g/dL (ref 13.0–17.0)
Lymphocytes Relative: 12.4 % (ref 12.0–46.0)
Lymphs Abs: 1.1 10*3/uL (ref 0.7–4.0)
MCHC: 35.1 g/dL (ref 30.0–36.0)
MCV: 96.4 fL (ref 78.0–100.0)
Monocytes Absolute: 0.5 10*3/uL (ref 0.1–1.0)
Monocytes Relative: 6.4 % (ref 3.0–12.0)
Neutro Abs: 6.7 10*3/uL (ref 1.4–7.7)
Neutrophils Relative %: 79.2 % — ABNORMAL HIGH (ref 43.0–77.0)
Platelets: 166 10*3/uL (ref 150.0–400.0)
Potassium: 4.7 meq/L (ref 3.5–5.1)
RBC: 4.2 M/uL — ABNORMAL LOW (ref 4.22–5.81)
RDW: 12.8 % (ref 11.5–14.6)
Sodium: 139 meq/L (ref 135–145)
Total Bilirubin: 0.6 mg/dL (ref 0.3–1.2)
Total Protein: 6.6 g/dL (ref 6.0–8.3)
WBC: 8.5 10*3/uL (ref 4.5–10.5)

## 2010-02-16 ENCOUNTER — Ambulatory Visit: Payer: Self-pay | Admitting: Internal Medicine

## 2010-02-16 DIAGNOSIS — M546 Pain in thoracic spine: Secondary | ICD-10-CM | POA: Insufficient documentation

## 2010-02-18 ENCOUNTER — Ambulatory Visit: Payer: Self-pay | Admitting: Family Medicine

## 2010-05-04 NOTE — Consult Note (Signed)
Summary: Dr. Quenten Raven  Dr. Quenten Raven   Imported By: Beau Fanny 04/10/2007 14:23:43  _____________________________________________________________________  External Attachment:    Type:   Image     Comment:   External Document

## 2010-05-04 NOTE — Progress Notes (Signed)
Summary: Rx Aricept  Phone Note Refill Request Call back at [phone] Message [facility]:  [facility] on Aug 07, 2009 11:05 AM  Refills Requested: Medication #1:  ARICEPT 10 MG  TABS once daily Received E-script request please advise.   Method Requested: Electronic Initial call taken by: Linde Gillis CMA Duncan Dull),  Aug 07, 2009 11:05 AM    Prescriptions: ARICEPT 10 MG  TABS (DONEPEZIL HCL) once daily  #90 Tablet x 3   Entered and Authorized by:   Kerby Nora MD   Signed by:   Kerby Nora MD on 08/07/2009   Method used:   Electronically [facility]        [facility] (retail)       [address]       [place], [place]  [address]       Ph: [phone] or [phone]       Fax: [phone]   RxID:   [id number]

## 2010-05-04 NOTE — Progress Notes (Signed)
Summary: Aricept Rx to Mail Order  Phone Note Refill Request Message from:  Fax from Pharmacy on April 14, 2008 1:11 PM  Refills Requested: Medication #1:  ARICEPT 10 MG  TABS once daily   Supply Requested[facility]     Method Requested: Electronic Initial call taken by: Delilah Shan,  April 14, 2008 1:12 PM      Prescriptions: ARICEPT 10 MG  TABS (DONEPEZIL HCL) once daily  #90 x 3   Entered and Authorized by:   Kerby Nora MD   Signed by:   Kerby Nora MD on 04/14/2008   Method used:   [facility]ron[facility] to        [facility]       [address] [place], [place]  [address]       Ph: [phone]       Fax: [phone]   RxID:   [id number]

## 2010-05-04 NOTE — Letter (Signed)
Summary: [name]  [facility]   Imported By: Lanelle Bal 06/15/2009 11:17:11  _____________________________________________________________________  External Attachment:    Type:   Image     Comment:   External Document  Appended Document: Orders Update    Clinical Lists Changes  Observations: Added new observation of EYES COMMENT: 06/2010 (06/15/2009 11:37) Added new observation of DMEYEEXMRES: normal (06/03/2009 11:37) Added new observation of DIAB EYE EX: normal (06/03/2009 11:37)       Diabetes Management History:      He has not been enrolled in the "Diabetic Education Program".  He is not checking home blood sugars.  He says that he is not exercising regularly.    Diabetes Management Exam:    Eye Exam:       Eye Exam done elsewhere          Date: 06/03/2009          Results: normal          Done by: [name]  Diabetes Management Assessment/Plan:      The following lipid goals have been established for the patient: Total cholesterol goal of 200; LDL cholesterol goal of 100; HDL cholesterol goal of 40; Triglyceride goal of 200.  His blood pressure goal is < 130/80.

## 2010-05-04 NOTE — Consult Note (Signed)
Summary: [facility]/Office Progress Note/Dr [facility]/Office Progress Note/Dr Granfortuna   Imported By: Mickle Asper 07/12/2007 14:15:00  _____________________________________________________________________  External Attachment:    Type:   Image     Comment:   External Document

## 2010-05-04 NOTE — Assessment & Plan Note (Signed)
Summary: 3 M F/U  DLO   Vital Signs:  Patient Profile:   77 Years Old Male Height:     70 inches Weight:      203.50 pounds Temp:     97.9 degrees F oral Pulse rate:   72 / minute Pulse rhythm:   regular BP sitting:   122 / 70  (left arm) Cuff size:   large  Vitals Entered By: Delilah Shan (November 29, 2007 3:41 PM)                 Chief Complaint:  3 months follow up.  History of Present Illness: High cholesterol: poor control Didn't tolerate pravachol, caused leg cramps  Ended up passing stone after negative MRI Saw urologist, had urethral stricture s/p dilation urinary flow is better  Diabetes Management History:      He has not been enrolled in the "Diabetic Education Program".  He states understanding of dietary principles and is following his diet appropriately.  No sensory loss is reported.  Self foot exams are being performed.  He is not checking home blood sugars.  He says that he is not exercising regularly.        There are no symptoms to suggest diabetic complications.  No changes have been made to his treatment plan since last visit.    Hypertension History:      He denies headache, chest pain, palpitations, dyspnea with exertion, orthopnea, PND, peripheral edema, visual symptoms, neurologic problems, and syncope.  He notes no problems with any antihypertensive medication side effects.        Positive major cardiovascular risk factors include male age 21 years old or older, diabetes, hyperlipidemia, and hypertension.  Negative major cardiovascular risk factors include non-tobacco-user status.        Current Allergies (reviewed today): No known allergies   Past Medical History:    Reviewed history from 01/08/2007 and no changes required:       HYPERTENSION (ICD-401.9)       ANXIETY (ICD-300.00)       ALZHEIMER'S DISEASE, MILD (ICD-331.0)       NEOP, MALIGNANT, PROSTATE (ICD-185)       Hx of CELLULITIS, METHICILLIN RESISTANT STAPHYLOCCOCUS AREUS  (ICD-682.9)       Hx of HX, PERSONAL, MALIGNANCY, BRONCHUS/LUNG (ICD-V10.11)       Hx of CARCINOMA, SKIN, SQUAMOUS CELL (ICD-173.9)             Review of Systems  General      Denies fatigue and fever.  CV      Denies chest pain or discomfort.  Resp      Denies shortness of breath.  GI      Denies abdominal pain, constipation, and diarrhea.  GU      Denies dysuria, hematuria, and urinary frequency.   Physical Exam  General:     Well-developed,well-nourished,in no acute distress; alert,appropriate and cooperative throughout examination Neck:     no carotid bruit or thyromegaly  Lungs:     Normal respiratory effort, chest expands symmetrically. Lungs are clear to auscultation, no crackles or wheezes. Heart:     Normal rate and regular rhythm. S1 and S2 normal without gallop, murmur, click, rub or other extra sounds. Pulses:     R and L posterior tibial pulses are full and equal bilaterally  Extremities:     No clubbing, cyanosis, edema, or deformity noted with normal full range of motion of all joints.    Diabetes Management Exam:  Foot Exam (with socks and/or shoes not present):       Sensory-Pinprick/Light touch:          Left medial foot (L-4): normal          Left dorsal foot (L-5): normal          Left lateral foot (S-1): normal          Right medial foot (L-4): normal          Right dorsal foot (L-5): normal          Right lateral foot (S-1): normal       Sensory-Monofilament:          Left foot: normal          Right foot: normal       Inspection:          Left foot: normal          Right foot: normal       Nails:          Left foot: normal          Right foot: normal    Impression & Recommendations:  Problem # 1:  HYPERCHOLESTEROLEMIA (ICD-272.0) Poor control. HAS not tolaertate any statin. Will try fenofibrate. Recheck in 3 months His updated medication list for this problem includes:    Fenofibrate 160 Mg Tabs (Fenofibrate) .Marland Kitchen... 1 tab by  mouth daily  Labs Reviewed: Chol: 229 (11/22/2007)   HDL: 27.9 (11/22/2007)   LDL: DEL (11/22/2007)   TG: 266 (11/22/2007) SGOT: 17 (11/22/2007)   SGPT: 14 (11/22/2007)  Prior 10 Yr Risk Heart Disease: Not enough information (04/09/2007)   Problem # 2:  DIABETES MELLITUS (ICD-250.00) Well controlled with diet. Encouraged exercise, weight loss, healthy eating habits.   Problem # 3:  HYPERTENSION (ICD-401.9) Well controlled on current medication. BP today: 122/70 Prior BP: 146/82 (09/20/2007)  Prior 10 Yr Risk Heart Disease: Not enough information (04/09/2007)  Labs Reviewed: Creat: 1.3 (04/17/2007) Chol: 229 (11/22/2007)   HDL: 27.9 (11/22/2007)   LDL: DEL (11/22/2007)   TG: 266 (11/22/2007)   Problem # 4:  FLANK PAIN, RIGHT (ICD-789.09) Assessment: Comment Only Resolved after visible stone was passed despite negative abdominal/pelvic  CT. The following medications were removed from the medication list:    Darvocet-n 100 100-650 Mg Tabs (Propoxyphene n-apap) .Marland Kitchen... 1 tab by mouth q 4-6 hours as needed pain   Complete Medication List: 1)  Aricept 10 Mg Tabs (Donepezil hcl) .... Once daily 2)  Zoloft 50 Mg Tabs (Sertraline hcl) .... Once daily 3)  Fenofibrate 160 Mg Tabs (Fenofibrate) .Marland Kitchen.. 1 tab by mouth daily  Diabetes Management Assessment/Plan:      The following lipid goals have been established for the patient: Total cholesterol goal of 200; LDL cholesterol goal of 100; HDL cholesterol goal of 40; Triglyceride goal of 200.  His blood pressure goal is < 130/80.    Hypertension Assessment/Plan:      The patient's hypertensive risk group is category C: Target organ damage and/or diabetes.  Today's blood pressure is 122/70.  His blood pressure goal is < 130/80.   Patient Instructions: 1)  Recheck fasting lipids, CMET, A1C  in 3 months Dx 272.0  , 250.00   Prescriptions: FENOFIBRATE 160 MG TABS (FENOFIBRATE) 1 tab by mouth daily  #30 x 5   Entered and Authorized by:   Kerby Nora MD   Signed by:   Kerby Nora MD on 11/29/2007   Method used:  [facility]        [facility] [id number] ([facility]ail[facility]       [address]       [place], [place]  [address]       Ph: [phone]       Fax: [phone]   RxID:   [id number]  ] Current Allergies (reviewed today): No known allergies  Current Medications (including changes made in today's visit):  ARICEPT 10 MG  TABS (DONEPEZIL HCL) once daily ZOLOFT 50 MG  TABS (SERTRALINE HCL) once daily FENOFIBRATE 160 MG TABS (FENOFIBRATE) 1 tab by mouth daily

## 2010-05-04 NOTE — Letter (Signed)
Summary: [facility]/Dr[facility]/Dr. [facility]   Imported By: Eleonore Chiquito 07/30/2008 08:34:54  _____________________________________________________________________  External Attachment:    Type:   Image     Comment:   External Document  Appended Document: Orders Update    Clinical Lists Changes  Observations: Added new observation of EYES COMMENT: 08/2009 (07/30/2008 13:17) Added new observation of DMEYEEXMRES: normal (07/28/2008 13:17) Added new observation of DIAB EYE EX: normal (07/28/2008 13:17)       Diabetes Management History:      He has not been enrolled in the "Diabetic Education Program".  He is not checking home blood sugars.  He says that he is not exercising regularly.    Diabetes Management Exam:    Eye Exam:       Eye Exam done elsewhere          Date: 07/28/2008          Results: normal          Done by: [facility]  Diabetes Management Assessment/Plan:      The following lipid goals have been established for the patient: Total cholesterol goal of 200; LDL cholesterol goal of 100; HDL cholesterol goal of 40; Triglyceride goal of 200.  His blood pressure goal is < 130/80.

## 2010-05-04 NOTE — Assessment & Plan Note (Signed)
Summary: 2WK FOLLOW UP / LFW   Vital Signs:  Patient profile:   77 year old male Weight:      202.75 pounds Temp:     98.2 degrees F oral Pulse rate:   80 / minute Pulse rhythm:   regular BP sitting:   130 / 70  (left arm) Cuff size:   large  Vitals Entered By: Selena Batten Dance CMA Duncan Dull) (February 16, 2010 11:25 AM) CC: 2 week follow up   History of Present Illness: CC: 2wk f/u  presents with wife and grandson  seen 2 wks ago with nonspecific abd pain and bloating.  trreated with gas x and improved some.  Still hurting with deep breath, certain twisting movements at spine bring on pain.  Feels like sharp pain in middle of back.  Still mild feeling with taking deep breath or coughing.  overall improved though.  acute abd series without acute process last visit, nonfasting blood work overall normal.  Stools - regular, goes 1-2 x/day, soft.  doesnt' think constipated.  R inner knee pustule x 3 days.  has tried antibiotic ointment for it.  no warm compresses yet.  no fever/chill, abd pain/ n/v.  Current Medications (verified): 1)  Aricept 10 Mg  Tabs (Donepezil Hcl) .... Once Daily 2)  Zoloft 50 Mg  Tabs (Sertraline Hcl) .... Once Daily 3)  Protectavison .Marland Kitchen.. 1 By Mouth Once Daily  Allergies (verified): No Known Drug Allergies  Past History:  Past Medical History: Last updated: 01/08/2007 HYPERTENSION (ICD-401.9) ANXIETY (ICD-300.00) ALZHEIMER'S DISEASE, MILD (ICD-331.0) NEOP, MALIGNANT, PROSTATE (ICD-185) Hx of CELLULITIS, METHICILLIN RESISTANT STAPHYLOCCOCUS AREUS (ICD-682.9) Hx of HX, PERSONAL, MALIGNANCY, BRONCHUS/LUNG (ICD-V10.11) Hx of CARCINOMA, SKIN, SQUAMOUS CELL (ICD-173.9)    Social History: Last updated: 01/08/2007 Occupation: retired from Sales promotion account executive Married Former Smoker 50 pack year history Alcohol use-no Drug use-no Regular exercise-no, mowing grass Diet: fruit and veggies  Review of Systems       per HPI  Physical Exam  General:   Well-developed,well-nourished,in no acute distress; alert,appropriate and cooperative throughout examination.   Msk:  + significnat scoliosis into lower thoracic region.  + R thoracic paraspinous mm tightness, tenderness to palpation.   FROM at knees bilaterally.  R inner medial knee with pustule with surrounding erythema about 1cm radius, demarcated with pen mark (angry erythema with solid line, mild erythema with dotted line).  no fluctuance.  + induration Pulses:  2+ rad/DP/PT pulses Extremities:  no pedal edema   Impression & Recommendations:  Problem # 1:  ABDOMINAL PAIN OTHER SPECIFIED SITE (ICD-789.09) much improved with gas x.  add flexeril for muscle spasm component in back.  see below.   His updated medication list for this problem includes:    Flexeril 5 Mg Tabs (Cyclobenzaprine hcl) .Marland Kitchen... Take one by mouth two times a day as needed muscle spasm, sedation precautions  Problem # 2:  PAIN IN THORACIC SPINE (ICD-724.1) cosnistent with muscle spasm, treat with flexeril, sedation precautions.  if not improving, consider L spine xray His updated medication list for this problem includes:    Flexeril 5 Mg Tabs (Cyclobenzaprine hcl) .Marland Kitchen... Take one by mouth two times a day as needed muscle spasm, sedation precautions  Problem # 3:  Hx of CELLULITIS, METHICILLIN RESISTANT STAPHYLOCCOCUS AREUS (ICD-682.9) h/o such, new cellulitis/abscess.  too superficial/small to drain.  will start on abx and warm compresses, reassess in 2 days.  His updated medication list for this problem includes:    Doxycycline Hyclate 100 Mg Caps (Doxycycline  hyclate) .Marland Kitchen... Take one by mouth two times a day x 10 days  Complete Medication List: 1)  Aricept 10 Mg Tabs (Donepezil hcl) .... Once daily 2)  Zoloft 50 Mg Tabs (Sertraline hcl) .... Once daily 3)  Protectavison  .Marland Kitchen.. 1 by mouth once daily 4)  Doxycycline Hyclate 100 Mg Caps (Doxycycline hyclate) .... Take one by mouth two times a day x 10 days 5)   Flexeril 5 Mg Tabs (Cyclobenzaprine hcl) .... Take one by mouth two times a day as needed muscle spasm, sedation precautions  Patient Instructions: 1)  Please return in 2 days for recheck. 2)  For skin infection - antibiotic twice daily for 10 days and warm compresses three times a day.  If redness spreading, or any fevers/chills, or coming to a head, please return. 3)  For back - try muscle relaxant flexeril 5mg  twice daily as needed for spasm.  Also may take ibuprofen 200mg  2 pills 2-3 times a day as needed for back pain. 4)  good to see you today.  call clinic with quesitons. Prescriptions: FLEXERIL 5 MG TABS (CYCLOBENZAPRINE HCL) take one by mouth two times a day as needed muscle spasm, sedation precautions  #20 x 0   Entered and Authorized by:   Eustaquio Boyden  MD   Signed by:   Eustaquio Boyden  MD on 02/16/2010   Method used:   [facility]        [facility] [id number]       [address]       [place], [place]  [address]       Ph: [phone] or [phone]       Fax: [phone]   RxID:   [id number] DOXYCYCLINE HYCLATE 100 MG CAPS (DOXYCYCLINE HYCLATE) take one by mouth two times a day x 10 days  #20 x 0   Entered and Authorized by:   Eustaquio Boyden  MD   Signed by:   Eustaquio Boyden  MD on 02/16/2010   Method used:   Electronically [facility]        [facility] (retail)       [address]       [place], [place]  [address]       Ph: [phone] or [phone]       Fax: [phone]   RxID:   [id number]    Orders Added: 1)  Est. Patient Level III [585[address]]    Current Allergies (reviewed today): No known allergies

## 2010-05-04 NOTE — Miscellaneous (Signed)
  Clinical Lists Changes  Observations: Added new observation of PSA: .01 ng/mL (06/24/2009 8:01) Added new observation of CALCIUM: 8.4 mg/dL (91/47/8295 6:21) Added new observation of ALBUMIN: 3.8 g/dL (30/86/5784 6:96) Added new observation of PROTEIN, TOT: 6.4 g/dL (29/52/8413 2:44) Added new observation of SGPT (ALT): 10 units/L (06/24/2009 8:01) Added new observation of SGOT (AST): 11 units/L (06/24/2009 8:01) Added new observation of ALK PHOS: 70 units/L (06/24/2009 8:01) Added new observation of BILI TOTAL: 0.5 mg/dL (04/06/7251 6:64) Added new observation of CREATININE: 1.12 mg/dL (40/34/7425 9:56) Added new observation of BUN: 13 mg/dL (38/75/6433 2:95) Added new observation of BG RANDOM: 226 mg/dL (18/84/1660 6:30) Added new observation of CL SERUM: 102 meq/L (06/24/2009 8:01) Added new observation of K SERUM: 4.3 meq/L (06/24/2009 8:01) Added new observation of NA: 137 meq/L (06/24/2009 8:01)

## 2010-05-04 NOTE — Progress Notes (Signed)
Summary: refill request for fenofibrate  Phone Note Refill Request Message from:  Fax from Pharmacy  Refills Requested: Medication #1:  FENOFIBRATE 160 MG TABS 1 tab by mouth daily. Faxed form from [facility] is on your desk.  Initial call taken by: Lowella Petties,  April 07, 2008 10:39 AM      Prescriptions: FENOFIBRATE 160 MG TABS (FENOFIBRATE) 1 tab by mouth daily  #90 x 3   Entered and Authorized by:   Kerby Nora MD   Signed by:   Kerby Nora MD on 04/07/2008   Method used:   Electronically to        [facility]       [address] [place], [place]  [address]       Ph: [phone]       Fax: [phone]   RxID:   [id number]

## 2010-05-04 NOTE — Letter (Signed)
[facility]   Imported By: Maryln Gottron 06/30/2009 11:30:50  _____________________________________________________________________  External Attachment:    Type:   Image     Comment:   External Document

## 2010-05-04 NOTE — Progress Notes (Signed)
Summary: zoloft  Phone Note Refill Request Message from:  [employer] on March 10, 2009 9:09 AM  Refills Requested: Medication #1:  ZOLOFT 50 MG  TABS once daily   [facility] Requested: 1 [phone]   Method Requested: Electronic Initial call taken by: Benny Lennert CMA (AAMA),  March 10, 2009 9:10 AM    Prescriptions: ZOLOFT 50 MG  TABS (SERTRALINE HCL) once daily  #30 x 0   Entered and Authorized by:   Hannah Beat MD   Signed by:   Hannah Beat MD on 03/10/2009   Method used:   Electronically [facility]        [facility] (retail)       [address]       [place], [place]  [address]       Ph: [phone] or [phone]       Fax: [phone]   RxID:   [id number]

## 2010-05-04 NOTE — Consult Note (Signed)
Summary: Dr. Fortino Sic  Dr. Fortino Sic   Imported By: Beau Fanny 04/10/2007 14:26:32  _____________________________________________________________________  External Attachment:    Type:   Image     Comment:   External Document

## 2010-05-04 NOTE — Assessment & Plan Note (Signed)
Summary: 3 M F/U  DLO   Vital Signs:  Patient Profile:   77 Years Old Male Height:     70 inches Weight:      204.38 pounds Temp:     97.5 degrees F oral Pulse rate:   84 / minute Pulse rhythm:   regular BP sitting:   142 / 80  (left arm) Cuff size:   large  Vitals Entered By: Delilah Shan (Aug 23, 2007 11:57 AM)               Vision Comments: 08/2008   Chief Complaint:  Follow up labs.  2.  Treated for MRSA on finger at UC.  History of Present Illness: Seen at Urgent Care, 3 weeks ago infection in right 4th digit, culture showed MRSA doxycycline 14 days current on second round of antibiotic 10 days redness and swelling going away no fever no ill feeling  cholesterol medicine made him feel fatigue  Diabetes Management History:      He has not been enrolled in the "Diabetic Education Program".  He states understanding of dietary principles but he is not following the appropriate diet.  He is not checking home blood sugars.  He says that he is not exercising regularly.        Hypoglycemic symptoms are not occurring.  No hyperglycemic symptoms are reported.        Since last visit, the following infection(s) have been reported: skin.  He's had no treatment plan problems.       Current Allergies (reviewed today): No known allergies   Past Medical History:    Reviewed history from 01/08/2007 and no changes required:       HYPERTENSION (ICD-401.9)       ANXIETY (ICD-300.00)       ALZHEIMER'S DISEASE, MILD (ICD-331.0)       NEOP, MALIGNANT, PROSTATE (ICD-185)       Hx of CELLULITIS, METHICILLIN RESISTANT STAPHYLOCCOCUS AREUS (ICD-682.9)       Hx of HX, PERSONAL, MALIGNANCY, BRONCHUS/LUNG (ICD-V10.11)       Hx of CARCINOMA, SKIN, SQUAMOUS CELL (ICD-173.9)          Past Surgical History:    Reviewed history from 01/08/2007 and no changes required:       lung cancer surgery, left upper lobectomy 2001       total prostatectomy 08/2000       hernia 2002  Tonsillectomy 1951       brain MRI 2005        hydrocele3/2002     Review of Systems  General      Denies fatigue and fever.  CV      Denies chest pain or discomfort.  Resp      Complains of shortness of breath.      Denies sputum productive.      stable SOB with exertion  GI      Denies abdominal pain.  GU      Denies dysuria.   Physical Exam  General:     Well-developed,well-nourished,in no acute distress; alert,appropriate and cooperative throughout examination Eyes:     No corneal or conjunctival inflammation noted. EOMI. Perrla. Funduscopic exam benign, without hemorrhages, exudates or papilledema. Vision grossly normal. Neck:     no carotid bruit or thyromegaly  Lungs:     Normal respiratory effort, chest expands symmetrically. Lungs are clear to auscultation, no crackles or wheezes. Heart:     Normal rate and regular rhythm. S1 and S2  normal without gallop, murmur, click, rub or other extra sounds.  Diabetes Management Exam:    Foot Exam (with socks and/or shoes not present):       Sensory-Pinprick/Light touch:          Left medial foot (L-4): normal          Left dorsal foot (L-5): normal          Left lateral foot (S-1): normal          Right medial foot (L-4): normal          Right dorsal foot (L-5): normal          Right lateral foot (S-1): normal       Sensory-Monofilament:          Left foot: normal          Right foot: normal       Inspection:          Left foot: normal          Right foot: normal       Nails:          Left foot: normal          Right foot: normal    Eye Exam:       Eye Exam done elsewhere          Date: 08/03/2007          Results: normal          Done by: Dr. Elmer Picker    Impression & Recommendations:  Problem # 1:  DIABETES MELLITUS (ICD-250.00) Worsening control. Start medication. Refused nutritionist. Counsled on lifestyle change.  Consider heart stress test at next visit for eval given new dx of DM. His updated  medication list for this problem includes:    Metformin Hcl 500 Mg Tb24 (Metformin hcl) .Marland Kitchen... Take 1 tablet by mouth once a day  Labs Reviewed: HgBA1c: 6.8 (05/21/2007)   Creat: 1.3 (04/17/2007)     Last Eye Exam: normal (08/03/2007)   Problem # 2:  HYPERCHOLESTEROLEMIA (ICD-272.0) Dis not tolerate simvastatin, try pravachol at lower dose.Encouraged exercise, weight loss, healthy eating habits.  The following medications were removed from the medication list:    Simvastatin 40 Mg Tabs (Simvastatin) .Marland Kitchen... Take 1 tablet by mouth once a day  His updated medication list for this problem includes:    Pravachol 20 Mg Tabs (Pravastatin sodium) .Marland Kitchen... Take 1 tablet by mouth once a day  Labs Reviewed: Chol: 242 (04/17/2007)   HDL: 25.9 (04/17/2007)   LDL: DEL (04/17/2007)   TG: 303 (04/17/2007) SGOT: 15 (04/17/2007)   SGPT: 15 (04/17/2007)  Prior 10 Yr Risk Heart Disease: Not enough information (04/09/2007)   Complete Medication List: 1)  Aricept 10 Mg Tabs (Donepezil hcl) .... Once daily 2)  Zoloft 50 Mg Tabs (Sertraline hcl) .... Once daily 3)  Fish Oil Oil (Fish oil) .... Take 1 capsule by mouth once a day 4)  Pravachol 20 Mg Tabs (Pravastatin sodium) .... Take 1 tablet by mouth once a day 5)  Metformin Hcl 500 Mg Tb24 (Metformin hcl) .... Take 1 tablet by mouth once a day  Diabetes Management Assessment/Plan:      The following lipid goals have been established for the patient: Total cholesterol goal of 200; LDL cholesterol goal of 100; HDL cholesterol goal of 40; Triglyceride goal of 200.  His blood pressure goal is < 130/80.     Patient Instructions: 1)  Please schedule a follow-up  appointment in 3 months. 2)  Hepatic Panel prior to visit, ICD-9: 272.0 3)  Lipid Panel prior to visit, ICD-9: 4)  HbgA1C prior to visit, ICD-9:250.00   Prescriptions: METFORMIN HCL 500 MG  TB24 (METFORMIN HCL) Take 1 tablet by mouth once a day  #30 x 6   Entered and Authorized by:   Kerby Nora  MD   Signed by:   Kerby Nora MD on 08/23/2007   Method used:   Electronically sent to ...       [facility]  [address]  [id number]       [address]       [place], [place]  [address]       Ph: [phone] or [phone]       Fax: [phone]   RxID:   [id number] PRAVACHOL 20 MG  TABS (PRAVASTATIN SODIUM) Take 1 tablet by mouth once a day  #30 x 5   Entered and Authorized by:   Kerby Nora MD   Signed by:   Kerby Nora MD on 08/23/2007   Method used:   Electronically sent to ...       [facility]  [address]  [phone]*       [address]       [place], [place]  [address]       Ph: [phone] or [phone]       Fax: [phone]   RxID:   [id number]  ] Current Allergies (reviewed today): No known allergies  Current Medications (including changes made in today's visit):  ARICEPT 10 MG  TABS (DONEPEZIL HCL) once daily ZOLOFT 50 MG  TABS (SERTRALINE HCL) once daily FISH OIL   OIL (FISH OIL) Take 1 capsule by mouth once a day PRAVACHOL 20 MG  TABS (PRAVASTATIN SODIUM) Take 1 tablet by mouth once a day METFORMIN HCL 500 MG  TB24 (METFORMIN HCL) Take 1 tablet by mouth once a day

## 2010-05-04 NOTE — Assessment & Plan Note (Signed)
Summary: 2 day follow up recheck/rbh   Vital Signs:  Patient profile:   77 year old male Height:      69 inches Weight:      202.75 pounds Temp:     98.3 degrees F oral Pulse rate:   76 / minute Pulse rhythm:   regular BP sitting:   130 / 62  (left arm) Cuff size:   large  Vitals Entered By: Selena Batten Dance CMA Duncan Dull) (February 18, 2010 3:04 PM) CC: Follow up   History of Present Illness: CC: f/u leg cellulitis  presents with wife.  seen 2 d ago with concern for R medial knee pustule/cellulitis.  started on doxy, advised to do warm compresses.  has been taking and tolerating fine.  No pain, fever,chill.  No limitation in ROM of knee.  h/o MRSA in past.  Flexeril helping back spasm.  Allergies: No Known Drug Allergies  Past History:  Past Medical History: Last updated: 01/08/2007 HYPERTENSION (ICD-401.9) ANXIETY (ICD-300.00) ALZHEIMER'S DISEASE, MILD (ICD-331.0) NEOP, MALIGNANT, PROSTATE (ICD-185) Hx of CELLULITIS, METHICILLIN RESISTANT STAPHYLOCCOCUS AREUS (ICD-682.9) Hx of HX, PERSONAL, MALIGNANCY, BRONCHUS/LUNG (ICD-V10.11) Hx of CARCINOMA, SKIN, SQUAMOUS CELL (ICD-173.9)    Social History: Last updated: 01/08/2007 Occupation: retired from Sales promotion account executive Married Former Smoker 50 pack year history Alcohol use-no Drug use-no Regular exercise-no, mowing grass Diet: fruit and veggies  Review of Systems       per HPI  Physical Exam  General:  Well-developed,well-nourished,in no acute distress; alert,appropriate and cooperative throughout examination. Pulses:  2+ rad/DP/PT pulses Extremities:  no pedal edema Skin:  FROM at knees bilaterally.  R inner medial knee with previous pustule now dry crusted lesion with surrounding angry erythema 3.5cm x 3 cm, lighter erythema 5cm in diameter.  no fluctuance.  + induration   Impression & Recommendations:  Problem # 1:  Hx of CELLULITIS, METHICILLIN RESISTANT STAPHYLOCCOCUS AREUS (ICD-682.9) h/o such, new  cellulitis/abscess.  too superficial/small to drain.  some improvement.  continue on abx and warm compresses, return for concerns or red flags.  His updated medication list for this problem includes:    Doxycycline Hyclate 100 Mg Caps (Doxycycline hyclate) .Marland Kitchen... Take one by mouth two times a day x 10 days  Complete Medication List: 1)  Aricept 10 Mg Tabs (Donepezil hcl) .... Once daily 2)  Zoloft 50 Mg Tabs (Sertraline hcl) .... Once daily 3)  Protectavison  .Marland Kitchen.. 1 by mouth once daily 4)  Doxycycline Hyclate 100 Mg Caps (Doxycycline hyclate) .... Take one by mouth two times a day x 10 days 5)  Flexeril 5 Mg Tabs (Cyclobenzaprine hcl) .... Take one by mouth two times a day as needed muscle spasm, sedation precautions  Patient Instructions: 1)  Continue antiboitic and warm compresses. 2)  If not improving after antibiotic, if any fevers/chills, if knee stiffness or pain with movement, or if coming to a head and may need drainage, please return to be seen.  Return if any concerns. 3)  Otherwise I think it should heal well.   Orders Added: 1)  Est. Patient Level II [62952]    Current Allergies (reviewed today): No known allergies

## 2010-05-04 NOTE — Assessment & Plan Note (Signed)
Summary: PAIN WHEN HE TAKES BREATH/CLE   Vital Signs:  Patient profile:   77 year old male Height:      69 inches Weight:      205.25 pounds BMI:     30.42 O2 Sat:      97 % on Room air Temp:     98.3 degrees F oral Pulse rate:   66 / minute Pulse rhythm:   regular BP sitting:   116 / 70  (left arm) Cuff size:   large  Vitals Entered By: Selena Batten Dance CMA (AAMA) (February 02, 2010 3:38 PM)  O2 Flow:  Room air CC: Pain when breathing   History of Present Illness: CC: pain with breathing and moving  2 wk h/o pain with breathing as well as certain movements ie laying down supine.  Pain started in back, then moved through sides up to front of ribcage and upper abdomen.  No abd pain but does have gassiness and bloating.  + mild rhinorrhea.  Denies cough, congestion, ST, fevers/chills, NS.  No CP/tightness, SOB.  No joint or muscle pain.  No change in DOE, no recent leg swelling, no dysuria.  No recent increase in abd distension.  + polyuria and trouble with dribbling (chronic issue).  no constipation or stool changes.  Passing stool and gas fine.  h/o L upper lobe resected 2/2 lung cancer.  since then, gets winded with heavy lifting.  h/o prostate ca s/p radical prostatectomy.    stopped doxazosin and metformin.  hyperglycemia in past, no formal dx diabetes.  was on metformin, self stopped 2/2 "i'm not diabetic".  Current Medications (verified): 1)  Aricept 10 Mg  Tabs (Donepezil Hcl) .... Once Daily 2)  Zoloft 50 Mg  Tabs (Sertraline Hcl) .... Once Daily 3)  Protectavison .Marland Kitchen.. 1 By Mouth Once Daily  Allergies (verified): No Known Drug Allergies  Past History:  Past Medical History: Last updated: 01/08/2007 HYPERTENSION (ICD-401.9) ANXIETY (ICD-300.00) ALZHEIMER'S DISEASE, MILD (ICD-331.0) NEOP, MALIGNANT, PROSTATE (ICD-185) Hx of CELLULITIS, METHICILLIN RESISTANT STAPHYLOCCOCUS AREUS (ICD-682.9) Hx of HX, PERSONAL, MALIGNANCY, BRONCHUS/LUNG (ICD-V10.11) Hx of CARCINOMA, SKIN,  SQUAMOUS CELL (ICD-173.9)    Past Surgical History: Last updated: 01/08/2007 lung cancer surgery, left upper lobectomy 2001 total prostatectomy 08/2000 hernia 2002 Tonsillectomy 1951 brain MRI 2005  hydrocele3/2002  Social History: Last updated: 01/08/2007 Occupation: retired from Sales promotion account executive Married Former Smoker 50 pack year history Alcohol use-no Drug use-no Regular exercise-no, mowing grass Diet: fruit and veggies PMH-FH-SH reviewed for relevance  Review of Systems       per HPI  Physical Exam  General:  Well-developed,well-nourished,in no acute distress; alert,appropriate and cooperative throughout examination.   Head:  Normocephalic and atraumatic without obvious abnormalities. No apparent alopecia or balding. Eyes:  No corneal or conjunctival inflammation noted. EOMI. Perrla. Ears:  dry cerumen bilaterally, decreased hearing Nose:  nares clear Mouth:  MMM Neck:  No deformities, masses, or tenderness noted. Lungs:  Normal respiratory effort, chest expands symmetrically. Lungs are clear to auscultation, no crackles or wheezes.  slight decreased breath sounds right upper lateral lung Heart:  Normal rate and regular rhythm. S1 and S2 normal without gallop, murmur, click, rub or other extra sounds. Abdomen:  slight flank pain bilaterally.  diffuse mild tenderness to palpation abdomen.  hyperactive BS.  no HSM, masses appreciated, no rebound, guarding.  Pulses:  2+ rad pulses Extremities:  no pedal edema Skin:  Intact without suspicious lesions or rashes   Impression & Recommendations:  Problem # 1:  ABDOMINAL PAIN OTHER SPECIFIED SITE (ICD-789.09) unclear etiology.  xray without PTX, no calculus, some stool but not large fecal load to point towards constipation.  UA without significant blood to point towards repeat kidney stone.  Check blood work to evaluate for other causes.  ? if just muscle strain vs gas.  Treat with gas-x.  RTC 1-2 wks for f/u with  myself or PCP.  consider treating with miralax to see if improves.  if not, would consider muscle relaxant.  Orders: T-Acute Abdomen (2 view w/ PA & Chest (60454UJ) TLB-BMP (Basic Metabolic Panel-BMET) (80048-METABOL) TLB-CBC Platelet - w/Differential (85025-CBCD) TLB-Hepatic/Liver Function Pnl (80076-HEPATIC) UA Dipstick W/ Micro (manual) (81191)  Complete Medication List: 1)  Aricept 10 Mg Tabs (Donepezil hcl) .... Once daily 2)  Zoloft 50 Mg Tabs (Sertraline hcl) .... Once daily 3)  Protectavison  .Marland Kitchen.. 1 by mouth once daily  Patient Instructions: 1)  Return in 1-2 wks for follow up. 2)  Return sooner if pain is getting worse or trouble breathing or fevers or worsening abdominal pain. 3)  Blood work today 4)  Push water, try Gas-x or Beano for gas. 5)  Urine looked ok today (very small amount of blood) and xrays looked ok today. 6)  Good to meet you today, call clinic with quesitons.   Orders Added: 1)  T-Acute Abdomen (2 view w/ PA & Chest [74022TC] 2)  TLB-BMP (Basic Metabolic Panel-BMET) [80048-METABOL] 3)  TLB-CBC Platelet - w/Differential [85025-CBCD] 4)  TLB-Hepatic/Liver Function Pnl [80076-HEPATIC] 5)  Est. Patient Level IV [47829] 6)  UA Dipstick W/ Micro (manual) [81000]    Current Allergies (reviewed today): No known allergies   Laboratory Results   Urine Tests  Date/Time Received: February 02, 2010 4:12 PM  Date/Time Reported: February 02, 2010 4:12 PM   Routine Urinalysis   Color: yellow Appearance: Clear Glucose: negative   (Normal Range: Negative) Bilirubin: negative   (Normal Range: Negative) Ketone: negative   (Normal Range: Negative) Spec. Gravity: 1.020   (Normal Range: 1.003-1.035) Blood: trace-lysed   (Normal Range: Negative) pH: 6.5   (Normal Range: 5.0-8.0) Protein: negative   (Normal Range: Negative) Urobilinogen: 0.2   (Normal Range: 0-1) Nitrite: negative   (Normal Range: Negative) Leukocyte Esterace: negative   (Normal Range:  Negative)  Urine Microscopic WBC/HPF: 0 RBC/HPF: 0-3 Bacteria/HPF: tr Mucous/HPF: no Epithelial/HPF: 0 Crystals/HPF: 0 Casts/LPF: 0    Comments: read by .........................Eustaquio Boyden  MD  February 02, 2010 5:13 PM

## 2010-05-04 NOTE — Op Note (Signed)
Summary: Left hydrocele  Left hydrocele   Imported By: Beau Fanny 04/10/2007 14:22:33  _____________________________________________________________________  External Attachment:    Type:   Image     Comment:   External Document

## 2010-05-04 NOTE — Procedures (Signed)
Summary: Colonoscopy  Colonoscopy   Imported By: Beau Fanny 04/10/2007 14:19:19  _____________________________________________________________________  External Attachment:    Type:   Image     Comment:   External Document

## 2010-05-04 NOTE — Assessment & Plan Note (Signed)
Summary: DIZZY,NAUSEA/CLE   Vital Signs:  Patient profile:   77 year old male Height:      69 inches Weight:      203.50 pounds BMI:     30.16 Temp:     97.5 degrees F oral Pulse rate:   88 / minute Pulse rhythm:   regular BP sitting:   120 / 78  (left arm)  Vitals Entered By: Sydell Axon (July 22, 2008 2:38 PM)  CC:  Dizzy and nauseated.  History of Present Illness: Here for dizziness and nausea/no vomiting or diarrhea --onset x 2d, onset gradual.  worse if gets up too quickly, and if turns head rapidly, --some runny nose, bad this year. --no fever or chills. --no hx of vertigo, does get mation sick at times  Drove to office with wife in exam room  Allergies: No Known Drug Allergies  Past History:  Past Medical History:    Reviewed history from 01/08/2007 and no changes required:    HYPERTENSION (ICD-401.9)    ANXIETY (ICD-300.00)    ALZHEIMER'S DISEASE, MILD (ICD-331.0)    NEOP, MALIGNANT, PROSTATE (ICD-185)    Hx of CELLULITIS, METHICILLIN RESISTANT STAPHYLOCCOCUS AREUS (ICD-682.9)    Hx of HX, PERSONAL, MALIGNANCY, BRONCHUS/LUNG (ICD-V10.11)    Hx of CARCINOMA, SKIN, SQUAMOUS CELL (ICD-173.9)       Review of Systems      See HPI  Physical Exam  General:  alert, well-developed, well-nourished, well-hydrated, and overweight-appearing.  NAD Ears:  R ear normal and L ear normal.   Nose:  no mucosal edema and no airflow obstruction.  sinuses neg Mouth:  pharynx pink and moist and no exudates.   Neck:  no JVD and no carotid bruits.   Lungs:  normal respiratory effort, no intercostal retractions, no accessory muscle use, normal breath sounds, no crackles, and no wheezes.   Heart:  normal rate, regular rhythm, and no murmur.   Neurologic:  alert & oriented X3, cranial nerves II-XII intact, strength normal in all extremities, sensation intact to light touch, gait normal, finger-to-nose normal, and Romberg negative--no palmer drift, able to heel-toe-tandum walk  without difficulty.   Psych:  normally interactive and good eye contact.     Impression & Recommendations:  Problem # 1:  VERTIGO (ICD-780.4) Assessment New will start on antivert 1/2 to 1 q8h as needed dizzness discussed course of disese and length see back if worsens or no better in 1-2 wks His updated medication list for this problem includes:    Meclizine Hcl 25 Mg Tabs (Meclizine hcl) .Marland Kitchen... 1 every 8hrs as needed nausea/dizziness  Complete Medication List: 1)  Aricept 10 Mg Tabs (Donepezil hcl) .... Once daily 2)  Zoloft 50 Mg Tabs (Sertraline hcl) .... Once daily 3)  Metformin Hcl 500 Mg Tabs (Metformin hcl) .... Take one by mouth daily 4)  Doxazosin Mesylate 2 Mg Tabs (Doxazosin mesylate) .... Take one by mouth once daily 5)  Meclizine Hcl 25 Mg Tabs (Meclizine hcl) .Marland Kitchen.. 1 every 8hrs as needed nausea/dizziness Prescriptions: MECLIZINE HCL 25 MG TABS (MECLIZINE HCL) 1 every 8hrs as needed nausea/dizziness  #30 x 1   Entered and Authorized by:   Gildardo Griffes FNP   Signed by:   Gildardo Griffes FNP on 07/22/2008   Method used:   Print then Give to Patient   RxID:   [id number]   Current Allergies (reviewed today): No known allergies

## 2010-05-04 NOTE — Assessment & Plan Note (Signed)
Summary: OV   Vital Signs:  Patient Profile:   77 Years Old Male Height:     70 inches Weight:      203 pounds Temp:     97.7 degrees F oral Pulse rate:   84 / minute Pulse rhythm:   regular BP sitting:   136 / 76  (left arm) Cuff size:   regular  Vitals Entered By: Delilah Shan (April 09, 2007 3:24 PM)                 Chief Complaint:  OV.  Hypertension History:      He denies chest pain, palpitations, dyspnea with exertion, orthopnea, PND, peripheral edema, visual symptoms, neurologic problems, and syncope.  Further comments include: BP well controlled .        Positive major cardiovascular risk factors include male age 30 years old or older and hypertension.  Negative major cardiovascular risk factors include non-tobacco-user status.     Current Allergies (reviewed today): No known allergies      Review of Systems       some intermittant leg calf cramps, some at night occurs some after working in yard, but not during cramps are better with walking    Physical Exam  General:     elderly male in NAD Neck:     no carotid bruit Lungs:     Normal respiratory effort, chest expands symmetrically. Lungs are clear to auscultation, no crackles or wheezes. Heart:     Normal rate and regular rhythm. S1 and S2 normal without gallop, murmur, click, rub or other extra sounds. Pulses:     R and L posterior tibial pulses are full and equal bilaterally  Extremities:     No clubbing, cyanosis, edema, or deformity noted with normal full range of motion of all joints.      Impression & Recommendations:  Problem # 1:  HYPERTENSION (ICD-401.9) Well controlled off medication.  Due for cholesterol screen.  Given leg cramps check potassium and increase fluid intake.  Does not sound like claudication or restless leg syndrome.  Complete Medication List: 1)  Aricept 10 Mg Tabs (Donepezil hcl) .... Once daily 2)  Zoloft 50 Mg Tabs (Sertraline hcl) .... Once daily 3)   Fish Oil Oil (Fish oil) .... Take 1 capsule by mouth once a day  Hypertension Assessment/Plan:      The patient's hypertensive risk group is category B: At least one risk factor (excluding diabetes) with no target organ damage.  Today's blood pressure is 136/76.  His blood pressure goal is < 140/90.   Patient Instructions: 1)  Return for fasting lipids, CMET Dx v77.91    ] Current Allergies (reviewed today): No known allergies  Current Medications (including changes made in today's visit):  ARICEPT 10 MG  TABS (DONEPEZIL HCL) once daily ZOLOFT 50 MG  TABS (SERTRALINE HCL) once daily FISH OIL   OIL (FISH OIL) Take 1 capsule by mouth once a day   Influenza Immunization History:    Influenza # 1:  Historical (01/03/2007)

## 2010-05-04 NOTE — Letter (Signed)
Summary: [place] No Show Letter  [place] [facility]  [address] [place], [place] [address]   Phone: [phone]  Fax: [phone]    02/25/2008 MRN: [id number]  ERMIN PARISIEN [address] [place], [place]  [address]   Dear Mr. SARVER,   Our records indicate that you missed your scheduled appointment with ____LAB_________________ on _11.23.09___________.  Please contact this office to reschedule your appointment as soon as possible.  It is important that you keep your scheduled appointments with your physician, so we can provide you the best care possible.  Please be advised that there may be a charge for "no show" appointments.    Sincerely,   [place] [facility]

## 2010-05-04 NOTE — Progress Notes (Signed)
Summary: refill requests for zoloft and aricept  Phone Note Refill Request Call back at Home Phone [phone] Message from:  Patient  Refills Requested: Medication #1:  ARICEPT 10 MG  TABS once daily  Medication #2:  ZOLOFT 50 MG  TABS once daily Pt needs 90 day scripts to send to [facility].  Call when ready unless they can be sent electronically.  Initial call taken by: Lowella Petties,  April 03, 2008 11:29 AM  Follow-up for Phone Call        Done. let pt know. Follow-up by: Kerby Nora MD,  April 03, 2008 12:59 PM  Additional Follow-up for Phone Call Additional follow up Details #1::        Advised pt's wife. Additional Follow-up by: Lowella Petties,  April 03, 2008 1:07 PM      Prescriptions: FENOFIBRATE 160 MG TABS (FENOFIBRATE) 1 tab by mouth daily  #30 x 5   Entered and Authorized by:   Kerby Nora MD   Signed by:   Kerby Nora MD on 04/03/2008   Method used:   Electronically to        [facility] (mail-order)       [address] [place], [place]  [address]       Ph: [phone]       Fax: [phone]   RxID:   [id number] ZOLOFT 50 MG  TABS (SERTRALINE HCL) once daily  #90 x 3   Entered and Authorized by:   Kerby Nora MD   Signed by:   Kerby Nora MD on 04/03/2008   Method used:   Electronically to        [facility] [employer]       [address] [place], [place]  [address]       Ph: [phone]       Fax: [phone]   RxID:   [id number]

## 2010-05-04 NOTE — Letter (Signed)
Summary: [facility]  [facility]   Imported By: Lanelle Bal 07/08/2008 13:43:04  _____________________________________________________________________  External Attachment:    Type:   Image     Comment:   External Document

## 2010-05-04 NOTE — Assessment & Plan Note (Signed)
Summary: ?UTI/CLE   Vital Signs:  Patient Profile:   77 Years Old Male Height:     70 inches Weight:      203.38 pounds Temp:     98.1 degrees F oral Pulse rate:   76 / minute Pulse rhythm:   regular BP sitting:   146 / 82  (left arm) Cuff size:   large  Vitals Entered By: Delilah Shan (September 20, 2007 4:10 PM)                 Chief Complaint:  ? UTI.  History of Present Illness: 1 week of right mid back pain, intermittant, 7/10 occ nausea when pain is present gone when pain is gone went away for a few days then came back yesterday does  radiates down towards rectum started after explosive BM Taking goddy tablets  no fever, no dysuria slight constipation, no diarrhea no urinary frequency no abdominal pain  no past kidney stones s/p prostatectomy    Current Allergies (reviewed today): No known allergies   Past Medical History:    Reviewed history from 01/08/2007 and no changes required:       HYPERTENSION (ICD-401.9)       ANXIETY (ICD-300.00)       ALZHEIMER'S DISEASE, MILD (ICD-331.0)       NEOP, MALIGNANT, PROSTATE (ICD-185)       Hx of CELLULITIS, METHICILLIN RESISTANT STAPHYLOCCOCUS AREUS (ICD-682.9)       Hx of HX, PERSONAL, MALIGNANCY, BRONCHUS/LUNG (ICD-V10.11)       Hx of CARCINOMA, SKIN, SQUAMOUS CELL (ICD-173.9)          Past Surgical History:    Reviewed history from 01/08/2007 and no changes required:       lung cancer surgery, left upper lobectomy 2001       total prostatectomy 08/2000       hernia 2002       Tonsillectomy 1951       brain MRI 2005        hydrocele3/2002     Review of Systems      See HPI   Physical Exam  General:     Well-developed,well-nourished,in no acute distress; alert,appropriate and cooperative throughout examination Lungs:     Normal respiratory effort, chest expands symmetrically. Lungs are clear to auscultation, no crackles or wheezes. Heart:     Normal rate and regular rhythm. S1 and S2 normal  without gallop, murmur, click, rub or other extra sounds. Abdomen:     right flank pain, no rebound, no guardingno distention, no masses, no rigidity, no abdominal hernia, no inguinal hernia, no hepatomegaly, and no splenomegaly.   Msk:     right CVA tenderness    Impression & Recommendations:  Problem # 1:  FLANK PAIN, RIGHT (ICD-789.09) likely due to kidney stones. Pain control, hydration and CT of abd/pelvis renal protocol.  Will send urine for culture. If fever ocurs or pain not controlled go to ER.  Orders: Radiology Referral (Radiology) T-Culture, Urine ([id number]) UA Dipstick W/ Micro (manual) (09811)  His updated medication list for this problem includes:    Darvocet-n 100 100-650 Mg Tabs (Propoxyphene n-apap) .Marland Kitchen... 1 tab by mouth q 4-6 hours as needed pain   Complete Medication List: 1)  Aricept 10 Mg Tabs (Donepezil hcl) .... Once daily 2)  Zoloft 50 Mg Tabs (Sertraline hcl) .... Once daily 3)  Fish Oil Oil (Fish oil) .... Take 1 capsule by mouth once a day 4)  Darvocet-n 100 100-650  Mg Tabs (Propoxyphene n-apap) .Marland Kitchen.. 1 tab by mouth q 4-6 hours as needed pain   Patient Instructions: 1)  Referral Appointment Information 2)  Day/Date: 3)  Time: 4)  Place/MD: 5)  Address: 6)  Phone/Fax: 7)  Patient given appointment information. Information/Orders faxed/mailed.    Prescriptions: DARVOCET-N 100 100-650 MG  TABS (PROPOXYPHENE N-APAP) 1 tab by mouth q 4-6 hours as needed pain  #30 x 0   Entered and Authorized by:   Kerby Nora MD   Signed by:   Kerby Nora MD on 09/20/2007   Method used:   Print then Give to Patient   RxID:   [id number]  ] Laboratory Results   Urine Tests  Date/Time Received: September 20, 2007 4:17 PM  Date/Time Reported: September 20, 2007 4:17 PM   Routine Urinalysis   Color: yellow Appearance: Clear Glucose: negative   (Normal Range: Negative) Bilirubin: negative   (Normal Range: Negative) Ketone: negative   (Normal Range:  Negative) Spec. Gravity: 1.025   (Normal Range: 1.003-1.035) Blood: large   (Normal Range: Negative) pH: 6.0   (Normal Range: 5.0-8.0) Protein: trace   (Normal Range: Negative) Urobilinogen: 0.2   (Normal Range: 0-1) Nitrite: negative   (Normal Range: Negative) Leukocyte Esterace: negative   (Normal Range: Negative)        Current Allergies (reviewed today): No known allergies  Current Medications (including changes made in today's visit):  ARICEPT 10 MG  TABS (DONEPEZIL HCL) once daily ZOLOFT 50 MG  TABS (SERTRALINE HCL) once daily FISH OIL   OIL (FISH OIL) Take 1 capsule by mouth once a day DARVOCET-N 100 100-650 MG  TABS (PROPOXYPHENE N-APAP) 1 tab by mouth q 4-6 hours as needed pain

## 2010-05-04 NOTE — Assessment & Plan Note (Signed)
Summary: new patient /rbh   Vital Signs:  Patient Profile:   77 Years Old Male Height:     70 inches Weight:      201.13 pounds Temp:     98 degrees F oral Pulse rate:   68 / minute Pulse rhythm:   regular BP sitting:   144 / 90  (left arm) Cuff size:   regular  Vitals Entered By: Delilah Shan (January 08, 2007 10:03 AM)                 Chief Complaint:  New Patient to Establish.  History of Present Illness: recent MRSA scrotumand  bottom, legs abcesses: s/p doxycycline and cephalexin, now resolved except or small lesion on buttock no fever, no N/V  HTN: had been on cardura, taken off by Dr. Sondra Barges 6 weeks ago  father with colon cancer history, personall history of  recent colonoscopy,  recommends repeat in 5 years  ? last cholesterol  Current Allergies (reviewed today): No known allergies   Past Medical History:    HYPERTENSION (ICD-401.9)    ANXIETY (ICD-300.00)    ALZHEIMER'S DISEASE, MILD (ICD-331.0)    NEOP, MALIGNANT, PROSTATE (ICD-185)    Hx of CELLULITIS, METHICILLIN RESISTANT STAPHYLOCCOCUS AREUS (ICD-682.9)    Hx of HX, PERSONAL, MALIGNANCY, BRONCHUS/LUNG (ICD-V10.11)    Hx of CARCINOMA, SKIN, SQUAMOUS CELL (ICD-173.9)       Past Surgical History:    lung cancer surgery, left upper lobectomy 2001    total prostatectomy 08/2000    hernia 2002    Tonsillectomy 1951    brain MRI 2005     hydrocele3/2002   Family History:    father died age 75 colon cancer    mother age 21 dementia, DM     1 brother: lung cancer      Social History:    Occupation: retired from [employer] account executive    Married    Former Smoker 50 pack year history    Alcohol use-no    Drug use-no    Regular exercise-no, mowing grass    Diet: fruit and veggies   Risk Factors:  Tobacco use:  quit Drug use:  no Alcohol use:  no Exercise:  no  Colonoscopy History:     Date of Last Colonoscopy:  04/27/2006    Results:  Hyperplastic Polyp    Review of  Systems  The patient denies fever, chest pain, syncope, dyspnea on exhertion, peripheral edema, prolonged cough, hemoptysis, abdominal pain, melena, hematochezia, and severe indigestion/heartburn.         no dysurai, no N/vomiting   Physical Exam  General:     elderly male in NAD Head:     Normocephalic and atraumatic without obvious abnormalities. No apparent alopecia or balding. Ears:     External ear exam shows no significant lesions or deformities.  Otoscopic examination reveals clear canals, tympanic membranes are intact bilaterally without bulging, retraction, inflammation or discharge. Hearing is grossly normal bilaterally. Nose:     External nasal examination shows no deformity or inflammation. Nasal mucosa are pink and moist without lesions or exudates. Mouth:     Oral mucosa and oropharynx without lesions or exudates.  Teeth in good repair. Lungs:     Normal respiratory effort, chest expands symmetrically. Lungs are clear to auscultation, no crackles or wheezes. Heart:     Normal rate and regular rhythm. S1 and S2 normal without gallop, murmur, click, rub or other extra sounds. Abdomen:     Bowel  sounds positive,abdomen soft and non-tender without masses, organomegaly or hernias noted. Skin:     right buttock, slightly erythematous indurated lesion with central heealing scab, no fluctulance, much improved per wife Psych:     Cognition and judgment appear intact. Alert and cooperative with normal attention span and concentration. No apparent delusions, illusions, hallucinations    Impression & Recommendations:  Problem # 1:  HYPERTENSION (ICD-401.9) Off Cadura, BP high nml.  Follow at home and call to start med if consistently elevated.  Problem # 2:  Hx of CELLULITIS, METHICILLIN RESISTANT STAPHYLOCCOCUS AREUS (ICD-682.9) Assessment: Improved F/Up with MD who began antibiotics for comparison from initial treatment.  MAy need slighlt longer course of treatment.  No I  and D needed.  MAy need to see ID specialist if recurent MRSA infections of scrotum continue to occur. Counsleled on preventative measures.  Problem # 3:  ALZHEIMER'S DISEASE, MILD (ICD-331.0) stable on aricept  Problem # 4:  ANXIETY (ICD-300.00) stable on zoloft His updated medication list for this problem includes:    Zoloft 50 Mg Tabs (Sertraline hcl) ..... Once daily   Complete Medication List: 1)  Aricept 10 Mg Tabs (Donepezil hcl) .... Once daily 2)  Zoloft 50 Mg Tabs (Sertraline hcl) .... Once daily   Patient Instructions: 1)  Check blood pressure at home, if >140/90  x 3 measurements call. 2)  Please request Dr. Claris Che chart for pt. transfer. 3)  Please schedule a follow-up appointment in 3 months.    Prescriptions: ZOLOFT 50 MG  TABS (SERTRALINE HCL) once daily  #90 x 3   Entered and Authorized by:   Kerby Nora MD   Signed by:   Kerby Nora MD on 01/08/2007   Method used:   Print then Give to Patient   RxID:   [id number] ARICEPT 10 MG  TABS (DONEPEZIL HCL) once daily  #90 x 3   Entered and Authorized by:   Kerby Nora MD   Signed by:   Kerby Nora MD on 01/08/2007   Method used:   Print then Give to Patient   RxID:   [id number]  ] Current Allergies (reviewed today): No known allergies    Tetanus/Td Immunization History:    Tetanus/Td # 1:  Historical (04/04/1994)  Pneumovax Immunization History:    Pneumovax # 1:  Historical (01/02/2001)  Influenza Vaccine (to be given today)

## 2010-05-04 NOTE — Consult Note (Signed)
Summary: Dr. Burman Foster  Dr. Burman Foster   Imported By: Beau Fanny 04/10/2007 14:20:27  _____________________________________________________________________  External Attachment:    Type:   Image     Comment:   External Document

## 2010-06-07 ENCOUNTER — Encounter: Payer: Self-pay | Admitting: Family Medicine

## 2010-06-14 ENCOUNTER — Other Ambulatory Visit: Payer: Self-pay | Admitting: Oncology

## 2010-06-14 ENCOUNTER — Encounter (HOSPITAL_BASED_OUTPATIENT_CLINIC_OR_DEPARTMENT_OTHER): Payer: Medicare Other | Admitting: Oncology

## 2010-06-14 ENCOUNTER — Ambulatory Visit (HOSPITAL_COMMUNITY)
Admission: RE | Admit: 2010-06-14 | Discharge: 2010-06-14 | Disposition: A | Discharge status: Home / Self Care | Payer: Medicare Other | Source: Ambulatory Visit | Attending: Oncology | Admitting: Oncology

## 2010-06-14 DIAGNOSIS — C3490 Malignant neoplasm of unspecified part of unspecified bronchus or lung: Secondary | ICD-10-CM

## 2010-06-14 DIAGNOSIS — Z8546 Personal history of malignant neoplasm of prostate: Secondary | ICD-10-CM

## 2010-06-14 LAB — COMPREHENSIVE METABOLIC PANEL
ALT: 13 U/L (ref 0–53)
AST: 11 U/L (ref 0–37)
Albumin: 4.1 g/dL (ref 3.5–5.2)
Alkaline Phosphatase: 81 U/L (ref 39–117)
BUN: 13 mg/dL (ref 6–23)
CO2: 27 mEq/L (ref 19–32)
Calcium: 9 mg/dL (ref 8.4–10.5)
Chloride: 100 mEq/L (ref 96–112)
Creatinine, Ser: 1.13 mg/dL (ref 0.40–1.50)
Glucose, Bld: 280 mg/dL — ABNORMAL HIGH (ref 70–99)
Potassium: 4.5 mEq/L (ref 3.5–5.3)
Sodium: 137 mEq/L (ref 135–145)
Total Bilirubin: 0.5 mg/dL (ref 0.3–1.2)
Total Protein: 6.6 g/dL (ref 6.0–8.3)

## 2010-06-14 LAB — CBC WITH DIFFERENTIAL/PLATELET
BASO%: 0.2 % (ref 0.0–2.0)
Basophils Absolute: 0 10*3/uL (ref 0.0–0.1)
EOS%: 0.9 % (ref 0.0–7.0)
Eosinophils Absolute: 0.1 10*3/uL (ref 0.0–0.5)
HCT: 41.3 % (ref 38.4–49.9)
HGB: 14.2 g/dL (ref 13.0–17.1)
LYMPH%: 11.6 % — ABNORMAL LOW (ref 14.0–49.0)
MCH: 32.2 pg (ref 27.2–33.4)
MCHC: 34.4 g/dL (ref 32.0–36.0)
MCV: 93.5 fL (ref 79.3–98.0)
MONO#: 0.5 10*3/uL (ref 0.1–0.9)
MONO%: 7.2 % (ref 0.0–14.0)
NEUT#: 5.7 10*3/uL (ref 1.5–6.5)
NEUT%: 80.1 % — ABNORMAL HIGH (ref 39.0–75.0)
Platelets: 155 10*3/uL (ref 140–400)
RBC: 4.41 10*6/uL (ref 4.20–5.82)
RDW: 12.8 % (ref 11.0–14.6)
WBC: 7.1 10*3/uL (ref 4.0–10.3)
lymph#: 0.8 10*3/uL — ABNORMAL LOW (ref 0.9–3.3)

## 2010-06-14 LAB — PSA: PSA: 0.01 ng/mL (ref <=4.00)

## 2010-06-14 LAB — LACTATE DEHYDROGENASE: LDH: 125 U/L (ref 94–250)

## 2010-06-15 LAB — HM DIABETES EYE EXAM: HM Diabetic Eye Exam: NORMAL

## 2010-06-18 ENCOUNTER — Encounter (HOSPITAL_BASED_OUTPATIENT_CLINIC_OR_DEPARTMENT_OTHER): Payer: Medicare Other | Admitting: Oncology

## 2010-06-18 DIAGNOSIS — Z8546 Personal history of malignant neoplasm of prostate: Secondary | ICD-10-CM

## 2010-06-18 DIAGNOSIS — Z85118 Personal history of other malignant neoplasm of bronchus and lung: Secondary | ICD-10-CM

## 2010-06-22 NOTE — Letter (Signed)
Summary: [facility]   Imported By: Kassie Mends 06/15/2010 08:15:23  _____________________________________________________________________  External Attachment:    Type:   Image     Comment:   External Document  Appended Document: Orders Update    Clinical Lists Changes  Observations: Added new observation of EYES COMMENT: 06/2011 (06/15/2010 8:21) Added new observation of DMEYEEXMRES: normal (06/15/2010 8:21) Added new observation of DIAB EYE EX: normal (06/15/2010 8:21)       Diabetes Management History:      He has not been enrolled in the "Diabetic Education Program".  He is not checking home blood sugars.  He says that he is not exercising regularly.    Diabetes Management Exam:    Eye Exam:       Eye Exam done elsewhere          Date: 06/15/2010          Results: normal          Done by: eye MD  Diabetes Management Assessment/Plan:      The following lipid goals have been established for the patient: Total cholesterol goal of 200; LDL cholesterol goal of 100; HDL cholesterol goal of 40; Triglyceride goal of 200.  His blood pressure goal is < 130/80.

## 2010-07-28 ENCOUNTER — Other Ambulatory Visit: Payer: Self-pay | Admitting: Dermatology

## 2010-08-05 ENCOUNTER — Encounter: Payer: Self-pay | Admitting: Family Medicine

## 2010-08-06 ENCOUNTER — Encounter: Payer: Self-pay | Admitting: Family Medicine

## 2010-08-06 ENCOUNTER — Ambulatory Visit (INDEPENDENT_AMBULATORY_CARE_PROVIDER_SITE_OTHER): Payer: Medicare Other | Admitting: Family Medicine

## 2010-08-06 DIAGNOSIS — E785 Hyperlipidemia, unspecified: Secondary | ICD-10-CM

## 2010-08-06 DIAGNOSIS — E7800 Pure hypercholesterolemia, unspecified: Secondary | ICD-10-CM

## 2010-08-06 DIAGNOSIS — L989 Disorder of the skin and subcutaneous tissue, unspecified: Secondary | ICD-10-CM

## 2010-08-06 DIAGNOSIS — I10 Essential (primary) hypertension: Secondary | ICD-10-CM

## 2010-08-06 DIAGNOSIS — E119 Type 2 diabetes mellitus without complications: Secondary | ICD-10-CM

## 2010-08-06 LAB — COMPREHENSIVE METABOLIC PANEL
ALT: 14 U/L (ref 0–53)
AST: 12 U/L (ref 0–37)
Albumin: 3.7 g/dL (ref 3.5–5.2)
Alkaline Phosphatase: 77 U/L (ref 39–117)
BUN: 17 mg/dL (ref 6–23)
CO2: 30 mEq/L (ref 19–32)
Calcium: 9 mg/dL (ref 8.4–10.5)
Chloride: 98 mEq/L (ref 96–112)
Creatinine, Ser: 1.2 mg/dL (ref 0.4–1.5)
GFR: 63.49 mL/min (ref >60.00)
Glucose, Bld: 272 mg/dL — ABNORMAL HIGH (ref 70–99)
Potassium: 4.6 mEq/L (ref 3.5–5.1)
Sodium: 136 mEq/L (ref 135–145)
Total Bilirubin: 0.7 mg/dL (ref 0.3–1.2)
Total Protein: 6.6 g/dL (ref 6.0–8.3)

## 2010-08-06 LAB — MICROALBUMIN / CREATININE URINE RATIO
Creatinine,U: 246.6 mg/dL
Microalb Creat Ratio: 0.8 mg/g (ref 0.0–30.0)
Microalb, Ur: 2 mg/dL — ABNORMAL HIGH (ref 0.0–1.9)

## 2010-08-06 LAB — LIPID PANEL
Cholesterol: 220 mg/dL — ABNORMAL HIGH (ref 0–200)
HDL: 30.6 mg/dL — ABNORMAL LOW (ref >39.00)
Total CHOL/HDL Ratio: 7
Triglycerides: 389 mg/dL — ABNORMAL HIGH (ref 0.0–149.0)
VLDL: 77.8 mg/dL — ABNORMAL HIGH (ref 0.0–40.0)

## 2010-08-06 LAB — LDL CHOLESTEROL, DIRECT: Direct LDL: 130 mg/dL

## 2010-08-06 LAB — HEMOGLOBIN A1C: Hgb A1c MFr Bld: 9.5 % — ABNORMAL HIGH (ref 4.6–6.5)

## 2010-08-06 NOTE — Patient Instructions (Signed)
Make an appt on way out for Annual Medicare Wellness in next 1-2 months.. No labs prior because having today.  For nodule in left armpit... Apply warm compresses  15 minutes 2-3 times a day.  Apply neosporin ointment.  We will call with your lab results.

## 2010-08-06 NOTE — Assessment & Plan Note (Signed)
Due for re-eval. 

## 2010-08-06 NOTE — Progress Notes (Signed)
  Subjective:    Patient ID: [name], male    DOB: May 01, 1930, 77 y.o.   MRN: [id number]  HPI  77 year old male with lung cancer, prostate cancer states that his blood sugar has been elevated in testing at his oncologists (Dr. Cyndie Chime). He has the diagnosis of diabetes but has not had his A1C checked here in quite sometime. He has been diet controlled with DM for several years.  No increase in thirst, no fatigue, no change in urination, no fever.      Lab Results  Component Value Date   HGBA1C 6.8* 11/22/2007    Has not had appt here since 2009.Marland Kitchen Overdue for chol eval.  HTN, well controlled at home. On no medication.     Review of Systems  Constitutional: Negative for fever, fatigue and unexpected weight change.       HAs noted a small nodule under his left arm in axillae...mild tender, no redness, no discharge.  HENT: Negative for ear pain, congestion, sore throat, rhinorrhea, trouble swallowing and postnasal drip.   Eyes: Negative for pain.  Respiratory: Negative for cough, shortness of breath and wheezing.   Cardiovascular: Negative for chest pain, palpitations and leg swelling.  Gastrointestinal: Negative for nausea, abdominal pain, diarrhea, constipation and blood in stool.  Skin: Negative for rash.  Neurological: Negative for light-headedness and headaches.  Psychiatric/Behavioral: Positive for behavioral problems. Negative for dysphoric mood. The patient is not nervous/anxious.        Objective:   Physical Exam  Constitutional: He is oriented to person, place, and time. Vital signs are normal. He appears well-developed and well-nourished.  HENT:  Head: Normocephalic.  Right Ear: Hearing normal.  Left Ear: Hearing normal.  Nose: Nose normal.  Mouth/Throat: Oropharynx is clear and moist and mucous membranes are normal.  Eyes: Conjunctivae and EOM are normal. Pupils are equal, round, and reactive to light.  Neck: Trachea normal. Carotid bruit is not  present. No mass and no thyromegaly present.  Cardiovascular: Normal rate, regular rhythm and normal pulses.  Exam reveals no gallop, no distant heart sounds and no friction rub.   No murmur heard.      No peripheral edema  Pulmonary/Chest: Effort normal and breath sounds normal. No respiratory distress.  Abdominal: Soft. Bowel sounds are normal.  Lymphadenopathy:    He has no cervical adenopathy.    He has no axillary adenopathy.  Neurological: He is alert and oriented to person, place, and time.  Skin: Skin is warm, dry and intact. No rash noted.       Slightly pink nodule superficial on left axillae, mobile, no discharge.  Psychiatric: He has a normal mood and affect. His speech is normal and behavior is normal. Thought content normal.          Assessment & Plan:

## 2010-08-06 NOTE — Assessment & Plan Note (Signed)
Most consistent with healing abcess..very superficial. Use topical antibiotic and warm compressess.  Call if not improving as expected.

## 2010-08-06 NOTE — Progress Notes (Signed)
Addended byMills Koller on: 08/06/2010 09:12 AM   Modules accepted: Orders

## 2010-08-06 NOTE — Assessment & Plan Note (Signed)
Well controlled on no medication  

## 2010-08-09 MED ORDER — METFORMIN HCL ER (OSM) 500 MG PO TB24: 500.0000 mg | ORAL_TABLET | Freq: Every day | ORAL | Status: DC

## 2010-08-09 MED ORDER — ATORVASTATIN CALCIUM 40 MG PO TABS: 40.0000 mg | ORAL_TABLET | Freq: Every day | ORAL | Status: DC

## 2010-08-09 NOTE — Progress Notes (Signed)
Addended byKerby Nora on: 08/09/2010 03:23 PM   Modules accepted: Orders

## 2010-08-17 NOTE — Progress Notes (Signed)
Addended byMills Koller on: 08/17/2010 11:10 AM   Modules accepted: Orders

## 2010-08-17 NOTE — Op Note (Signed)
[name], MALECHA NO.:  [id number]   MEDICAL RECORD NO.:  [id number]          PATIENT TYPE:  AMB   LOCATION:  DAY                          FACILITY:  [facility]   PHYSICIAN:  Jamison Neighbor, M.D.  DATE OF BIRTH:  07/19/1930   DATE OF PROCEDURE:  10/02/2007  DATE OF DISCHARGE:                               OPERATIVE REPORT   PREOPERATIVE DIAGNOSIS:  Bladder neck contracture.   POSTOPERATIVE DIAGNOSIS:  Bladder neck contracture.   PROCEDURES:  1. Cystoscopy.  2. Dilation of bladder neck contracture.   SURGEON:  Jamison Neighbor, M.D.   ANESTHESIA:  General.   COMPLICATIONS:  None.   DRAINS:  None.   BRIEF HISTORY:  This 77 year old male is status post radical  prostatectomy many years ago and has done quite well.  The patient has  had no evidence of recurrent carcinoma.  The patient recently developed  some problems with difficulty voiding and underwent cystoscopic  examination which showed a tight bladder neck contracture.  The patient  just passed a small stone and said he thought he was voiding a little  bit better, but clearly this bladder neck contracture is very tight and  he needs to have this dealt with.  He was advised that he should have  this dilated but really did not feel he could tolerate this in the  office.  For that reason he is now to undergo cystoscopy and dilation of  bladder neck with incision and/or transurethral resection if indicated.  He understands the risks and benefits of the procedure.  He does note  there is some risk he will have some urinary control problems if the  sphincter mechanism is involved in any way.  Full informed consent was  obtained.   PROCEDURE:  After successful induction of general anesthesia, the  patient was placed in the dorsal position, prepped with Betadine and  draped in the usual sterile fashion.  Cystoscopy was performed and the  urethra was visualized in it entirety down to the bladder neck, at  which  point a bladder neck contracture could be identified.  There were no  urethral strictures seen.  The guidewire was passed into the bladder,  where it was allowed to coil normally.  The bladder neck was then  dilated from 14-French up to 30-French.  The dilation was very easy and  it did not appear that this was a particularly dense or firm stricture.  After dilation up to 30-French, the cystoscope was reinserted.  The  bladder was carefully inspected.  There were no trabeculations, no sign  of any tumor.  No stones could be seen.  The ureteral orifices were  unremarkable.  The bladder neck was inspected and there was nothing to  suggest that the patient had a recurrence of his carcinoma.  There did  appear to be very a small distance between the bladder neck and the  sphincter mechanism itself but there was great concern that if that area  was incised and/or resected, that the patient would be at high risk for  incontinence.  Since the dilation was  simple and it did not appear to be  a dense stricture, the decision was made not to cut into that area but  simply to dilate the patient and perhaps consider self-catheterization  on a regular basis to keep that area open.  The scope was withdrawn and  the bladder  was drained.  The patient tolerated the procedure well and was taken to  the recovery room in good condition.  He will stay on the same  medications.  He will not require any new medications.  He will return  to see me in follow-up in 2 weeks' time.      Jamison Neighbor, M.D.  Electronically Signed     RJE/MEDQ  D:  10/02/2007  T:  10/02/2007  Job:  [id number]

## 2010-08-20 NOTE — Discharge Summary (Signed)
[facility]  Patient:    [name], [name]                     MRN: [id number] Adm. Date:  [id number] Disc. Date: 01/10/00 Attending:  Cameron Proud                           Discharge Summary  DATE OF BIRTH:  [birth year]  ADMISSION DIAGNOSIS:  Adenocarcinoma of the left lung.  DISCHARGE DIAGNOSES: 1. Adenocarcinoma of the left lung. 2. Status post left thoracotomy with left upper lobe resection.  ADMISSION HISTORY & PHYSICAL:  No H&P was located on the chart.  ADMISSION LABORATORY DATA:  White blood cell count 8.4, hemoglobin 15.5, hematocrit 43.9.  Sodium 137, potassium 4.9, BUN 13, creatinine 1.0.  HOSPITAL COURSE AND PROCEDURES:  [name] underwent a left VATS with a left thoracotomy and left upper lobectomy on January 03, 2000, by Dr. Edwyna Shell under general endotracheal anesthesia without complications.  The patient was transferred in stable condition to the recovery room.  The patient was subsequently transferred to the surgical stepdown unit postoperatively.  On postoperative day #1, the patient was found to have chest tube air leak. However, the anterior chest tube was discontinued.  Blood gas at this time revealed pH 7.3, PCO2 45.3, PO2 81.7, bicarb 27.2.  His hemoglobin and hematocrit was stable at this time.  His pain was well controlled with epidural catheter.  On postoperative day #2, no significant changes were made, and patient had undergone full value.  On postoperative day #3, the patient had his epidural anesthesia discontinued and Foley catheter discontinued.  At this time, he was saturating 95% on 2 liters.  On postoperative day #4, the patients chest x-ray a this time revealed lungs fully expanded with no pneumothorax.  The remaining chest tube was discontinued at this time.  On postoperative day #5, the patient had a brain scan which was negative for metastases or masses.  The patient subsequently had  oxygen weaned off and was not requiring any oxygen therapy.  He was out of bed and mobilizing well.  On postoperative day #6, the patient was again found to be stable without any complications or complaints.  Because of this subsequent discharge plans were scheduled for January 10, 2000.  Before discharge, however, the patient will undergo bone scan.  DISCHARGE MEDICATIONS:   The patient was instructed to resume his home medications.  He was also instructed to take Darvocet-N 100 one to two tablets every 4 to 6 hours as needed for pain.  DISCHARGE ACTIVITY:  The patient was instructed no lifting objects over 10 pounds, strenuous activity, or driving.  DISCHARGE DIET:  Low fat, low salt.  WOUND CARE:  The patient was instructed to cleanse wounds with mild soap and water.  He was instructed not to take any tub baths.  DISCHARGE DISPOSITION:  Home.  DISCHARGE FOLLOWUP:  The patient was instructed to follow up with Dr. Edwyna Shell at the [facility] office.  The office will call him for appointment in two weeks. The patient was also instructed to go to the [facility] one hour before his appointment with Dr. Edwyna Shell at which time he will have a chest x-ray taken.  The patient was instructed to take this x-ray to Dr. Remo Lipps office with him. DD:  01/09/00 TD:  01/10/00 Job: [id number]

## 2010-08-20 NOTE — Op Note (Signed)
[place]. [facility]  Patient:    [name], [name] Visit Number: [id number] MRN: [id number]          Service Type: DSU Location: [facility] Attending Physician:  Shelly Rubenstein Dictated by:   Abigail Miyamoto, M.D. Proc. Date: 06/26/01 Admit Date:  06/26/2001                             Operative Report  PREOPERATIVE DIAGNOSIS:  Left inguinal hernia.  POSTOPERATIVE DIAGNOSIS:  Left inguinal hernia.  OPERATION PERFORMED:  Left inguinal hernia repair with mesh.  SURGEON:  Abigail Miyamoto, M.D.  ANESTHESIA:  General endotracheal anesthesia.  ESTIMATED BLOOD LOSS:  Minimal.  DESCRIPTION OF PROCEDURE:  Patient brought to operating room and identified as Althea Charon.  He was placed supine on the operating table and general anesthesia was induced.  His abdomen was then prepped and draped in the usual sterile fashion.  Using a #15 blade a small longitudinal incision was made in the patients left groin.  Incision was carried down through Scarpas fascia with electrocautery.  The external oblique fascia was then identified and opened with a scalpel and then further toward the external ring with the Metzenbaum scissors.  The patients testicular cord was easily identified and controlled circumferentially with a Penrose drain.  The patient was found to have a very large indirect inguinal hernia.  The hernia sac was separated from the structures of the testicular cord.  The sac was then opened and was found to contain colon.  The colon was reduced back into the abdominal cavity after several adhesions were taken down.  The base of the sac was then closed off with pursestring 2-0 silk suture.  The redundant sac was then excised. The preperitoneal space was then dissected out with a Raytec sponge.  A piece of Ethicon Prolene mesh with the preperitoneal plug was brought onto the field.  The preperitoneal portion was then placed into the preperitoneal space and  the overlay portion was opened into the inguinal floor.  The mesh was then sewn in circumferentially with interrupted 2-0 Vicryl suture sewing it to the pubic tubercle, the transversalis fascia, the shelving edge of the inguinal ligament, then out laterally.  A slit was cut in the mesh to incorporate the cord structures.  Once this was done, excellent coverage of the hernia defect appeared to be achieved.  The external oblique fascia was then closed with running 2-0 Vicryl sutures.  Scarpas fascia was then closed with interrupted 3-0 Vicryl suture and the skin was closed with running 4-0 Vicryl suture. Steri-Strips, gauze and tape were then applied.  The patient tolerated the procedure well.  Preoperatively the patient was anesthetized with 0.25% Marcaine in the groin.  All sponge, needle and instrument counts were correct at the end of the procedure.  The patient was then extubated in the operating room and taken in stable condition to the recovery room. Dictated by:   Abigail Miyamoto, M.D. Attending Physician:  Shelly Rubenstein DD:  06/26/01 TD:  06/26/01 Job: [id number]

## 2010-08-20 NOTE — Op Note (Signed)
[place]  Patient:    KOUPER, SPINELLA                     MRN: [id number] Adm. Date:  [id number] Attending:  Cameron Proud CC:         Norton Blizzard, M.D. (2)   Operative Report  PREOPERATIVE DIAGNOSIS: Left upper lobe lesion, adenocarcinoma.  POSTOPERATIVE DIAGNOSIS: Left upper lobe lesion, adenocarcinoma.  OPERATION/PROCEDURE: Fiberoptic bronchoscopy and mediastinoscopy.  SURGEON: D. Karle Plumber, M.D.  DESCRIPTION OF PROCEDURE: After general anesthesia was obtained the fiberoptic bronchoscope was passed through the endotracheal tube.  The carina was midline and the right upper lobe, right middle lobe, and right lower lobe orifices were normal.  The left upper lobe orifice showed no endobronchial lesion.  The left lower lobe orifice was found to have a left main stem bronchus normal. Cytology and cultures were taken.  The fiberoptic bronchoscope was removed and the anterior neck was prepped and draped in the usual sterile manner.  A transverse incision was made above the sternal notch and carried down with electrocautery to the subcutaneous tissue.  The pretracheal fascia was entered and the video mediastinoscope inserted.  Biopsies of four nodes were done which appeared to be anthracotic.  The patient had a large lymph node on CT scan.  After this the mediastinoscope was then removed and strap muscles were closed with 3-0 Vicryl, the subcutaneous tissue with 3-0 Vicryl, and the subcuticular with 3-0 Vicryl.  Steri-Strips were applied.  The patient was returned to the recovery room in stable condition. DD:  12/31/99 TD:  12/31/99 Job: [id number]

## 2010-08-20 NOTE — Op Note (Signed)
[facility]  Patient:    [name], [name] Visit Number: [id number] MRN: [id number]          Service Type: NES Location: [facility] Attending Physician:  Londell Moh Proc. Date: 09/18/01 Admit Date:  09/18/2001   CC:         Doreen Beam, M.D.   Operative Report  PREOPERATIVE DIAGNOSIS:  Left hydrocele.  POSTOPERATIVE DIAGNOSIS:  Left hydrocele.  PROCEDURE PERFORMED:  Left hydrocelectomy.  SURGEON:  Jamison Neighbor, M.D.  ANESTHESIA:  General.  COMPLICATIONS:  None.  DRAINS:  None.  INDICATION:  This 77 year old male is status post a radical retropubic prostatectomy.  He has had no complications or problems since that time with an undetectable PSA.  The patient had a left inguinal hernia repair following the radical prostatectomy.  The patient had a preexisting hydrocele which got much worse after the hernia repair.  The patient is now symptomatic and would like to have this removed.  The patient understands the risks and benefits of a hydrocelectomy including the possibility of postoperative hematoma, wound infection, etc.  He gave informed consent.  DESCRIPTION OF PROCEDURE:  After the successful induction of general anesthesia, the patient was placed in the dorsolithotomy position, prepped with Betadine, and draped in the usual sterile fashion.  An incision was made in the left hemiscrotum and carried down through the subcutaneous tissues, Dartos layer, and all the investing layers of the scrotum until the tunica vaginalis was identified.  The hydrocele sac was stripped away from the surrounding tissue and the cord was mobilized.  The sac was opened and the hydrocele was drained.  There was no abnormal appearing fluid inside the hydrocele sac.  The patient had a few small testicular patches which were removed with electrocautery.  The epididymis itself appeared unremarkable. The redundant sac was partially excised.  The sac  was turned inside out and the edges were oversewn with a running suture of 3-0 Vicryl.  The inside of the scrotum was carefully inspected.  Adequate hemostasis was obtained.  The scrotum was closed in multiple layers with Vicryl.  The skin was closed with running 3-0 chromic.  The patient had a Penrose drain placed.  It was sutured in place with a chromic suture.  colloidin was placed on the incision.  The patient was given fluffs and a scrotum support.  The patient tolerated the procedure and was taken to the recovery room in good condition.  He will be sent home with Tylox as well as with doxycycline and return for drain removal in one weeks time. Attending Physician:  Londell Moh DD:  09/18/01 TD:  09/19/01 Job: [id number]

## 2010-08-20 NOTE — Procedures (Signed)
[place]. [facility]  Patient:    [name], [name]                     MRN: [id number] Proc. Date: 11/29/99 Adm. Date:  [id number] Attending:  Orland Mustard CC:         Tera Mater. Clent Ridges   Procedure Report  PROCEDURE:  Colonoscopy with coagulation of polyp and polypectomy.  MEDICATIONS:  Fentanyl 50 mcg, Versed 5 mg IV.  INDICATIONS:  Previous history of adenomatous colon polyp.  SCOPE:  Pediatric colonoscope used, adult scope was not available.   Adult scope could have easily been used.  DESCRIPTION OF PROCEDURE:  Procedure has been explained to patient, consent obtained.  Patient in left lateral decubitus position.  The Olympus pediatric video colonoscope was inserted, advanced under direct visualization.  The prep was marginal.  We were able to advance easily cecum.  The right lower quadrant was transilluminated and ileocecal valve was seen.  Scope withdrawn. Cecum, ascending colon, hepatic flexure, transverse colon, splenic flexure, descending colon seen well.  A 3-4 mm polyp seen in the descending colon and cauterized, placed in jar #1.  In the mid sigmoid, a 0.75 cm sessile polyp snared and sucked through the scope.  No other polyps were seen. Scope was withdrawn and patient tolerated the procedure well.  ASSESSMENT:  Polyps in the descending and sigmoid colons, snared and cauterized.  PLAN:  Will recommend repeating in three years. DD:  11/29/99 TD:  11/29/99 Job: [id number]

## 2010-08-20 NOTE — Op Note (Signed)
. [facility]  Patient:    [name], [name]                     MRN: [id number] Adm. Date:  [id number] Disc. Date: [id number] Attending:  Cameron Proud CC:         Charlaine Dalton. Sherene Sires, M.D. [facility]                           Operative Report  PREOPERATIVE DIAGNOSIS:  Adenocarcinoma left upper lobe.  POSTOPERATIVE DIAGNOSIS:  Adenocarcinoma left upper lobe.  OPERATION PERFORMED:  Left upper lobectomy.  SURGEON:  D. Karle Plumber, M.D.  FIRST ASSISTANT:  Loura Pardon, P.A.  ANESTHESIA:  General.  DESCRIPTION OF PROCEDURE:  After adequate general anesthesia the patient was turned to the left lateral thoracotomy position and a dual lumen tube was inserted. 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 The left chest was prepped and draped in the usual sterile manner.  Two trocar sites were made in the anterior and posterior axillary line at the seventh intercostal space. Two trocars were inserted. The lesion could be seen in the left upper lobe in the posterior segment. A third trocar site was made in the midaxillary line, and then this was enlarged to approximately 6 cm. The latissimus was reflected laterally and the serratus was split. The fifth intercostal space was entered. Two Toupets were placed at right angles. The dissection was started superiorly sifting out the apical posterior and the anterior branches to the left upper lobe. A 4-L node and a 10-L node was removed and then this was divided. The branches were doubly ligated with 0 silk and then clipped and divided.  Attention was turned to the superior pulmonary vein and the patients superior pulmonary vein was dissected out and looped with a vascular tape. It was interesting that the most superior and inferior pulmonary vein came off almost a common trunk of the left  atrium, but this was recognized and the inferior pulmonary vein was preserved. The superior pulmonary vein was then stapled and divided with an autosuture vascular stapler. The fissure was incomplete and had to be divided; several adhesions were taken down, and dissection was carried up on the pulmonary artery identifying an anterior branch of the left upper lobe and this was ligated with 2-0 silk, clipped distally, and divided. Then, the inferior portion of the fissure was divided with three applications of the 45 stapler and this allowed [place] to dissect out the rest of the fissure and divided this with another 45 stapler, exposing two large lingular branches. These were both doubly ligated with 0 silk, clipped and divided. Then the bronchus was stapled with two applications of the 45 stapler and the inferior pulmonary ligament was taken down, the left upper lobe was removed and several areas of leak in the left lower lobe were oversewn with 3-0 Vicryl. Two chest tubes were placed through separate stab wounds and tied in place with 0 silk. The chest was closed with two pericostals of #1 Vicryl and the muscle layer and Ethicon skin clips.  The patient was then returned to the recovery room room in stable condition. DD:  01/03/00 TD:  01/03/00 Job: [id number]

## 2010-08-20 NOTE — Discharge Summary (Signed)
[facility]  Patient:    [name], [name] Visit Number: [id number] MRN: [id number]          Service Type: SUR Location: 3W 0371 01 Attending Physician:  Londell Moh Dictated by:   Jamison Neighbor, M.D. Admit Date:  04/11/2001 Discharge Date: 04/14/2001   CC:         Dwon Sky. Cyndie Chime, M.D.  Tera Mater. Clent Ridges, M.D.   Discharge Summary  DISCHARGE DIAGNOSIS:  Carcinoma of the bladder.  SECONDARY DIAGNOSES: 1. History of lung cancer. 2. Hypertension. 3. History of tobacco use.  PRINCIPAL PROCEDURE:  Radical prostatectomy and bilateral pelvic lymph node dissection performed on April 11, 2001.  HISTORY OF PRESENT ILLNESS:  This 77 year old male underwent a prostate biopsy because of a rise in his PSA.  This occurred after he was started on testosterone supplementation.  Patient was found to have prostate cancer on the right-hand side.  Patient was given option for therapy including radical prostatectomy, radiation therapy, radiation seed implantation, and close observation, as well as hormone therapy, cryosurgery, and chemotherapy.  The patient was told the best option to consider would be either surgery or radiation therapy.  The patient requested that surgery be performed since he did not feel that he could tolerate radiation treatment.  The patient had a good understanding of the risks and benefits of the procedure including the risk for incontinence and impotence.  He was admitted following his radical prostatectomy.  PAST MEDICAL HISTORY:  Remarkable for lung cancer.  He is also known to have hypertension.  SOCIAL HISTORY, FAMILY HISTORY, REVIEW OF SYSTEMS, PHYSICAL EXAMINATION:  Well described in the initial history and physical.  HOSPITAL COURSE:  Patient was taken to the operating room on April 11, 2001, where he underwent a successful radical retropubic prostatectomy.  Patients postoperative course was unremarkable.  His  Foley catheter had drained very nicely.  His postoperative hematocrit was 31.  He had normal electrolytes.  He was rapidly advanced to a regular diet.  JP drainage was minimal.  Patient was ready for discharge by April 14, 2001.  Patient was sent home with a prescription for Tylox, Pyridium Plus for any spasm, and Septra DS.  He will return for removal of the catheter as well as removal of the staples. Dictated by:   Jamison Neighbor, M.D. Attending Physician:  Londell Moh DD:  04/25/01 TD:  04/25/01 Job: [id number]

## 2010-08-20 NOTE — Discharge Summary (Signed)
[place]. [facility]  Patient:    [name], [name]                     MRN: [id number] Adm. Date:  [id number] Attending:  Cameron Proud Dictator:   [id number]                           Discharge Summary  NO DICTATION DD:  01/09/00 TD:  01/10/00 Job: [phone] [phone]

## 2010-08-20 NOTE — Procedures (Signed)
[facility]  Patient:    [name], [name]                     MRN: [id number] Proc. Date: 12/14/99 Adm. Date:  [id number] Attending:  Avie Echevaria CC:         Tera Mater. Clent Ridges, M.D.   Procedure Report  REFERRING PHYSICIAN:  Jeannett Senior A. Clent Ridges, M.D.  PROCEDURE:  Fiberoptic bronchoscopy with transbronchial biopsy of the left upper lobe.  INDICATIONS FOR PROCEDURE:  This is a 77 year old white male seen courtesy of Dr. Clent Ridges for evaluation of left upper lobe nodular mass. For a full history and exam, please see comprehensive pulmonary consultation note dictated in the office within the past 2 weeks. There has been no change since that evaluation was done. The indication for the procedure was to try and obtain tissue diagnosis to assess whether or not the lesion has neoplastic features as there are no baseline chest x-rays for comparison. The patient was offered an excisional biopsy but declined favoring transbronchial approach given its risks, benefits, and alternatives.  The procedure was performed in the bronchoscopy suite with continuous monitoring by surface ECG and oximetry. The patient maintained greater than 90% saturation throughout the procedure with supplemental oxygen maintaining sinus rhythm and tolerated the procedure well. He was given a total of 5 mg of IV Versed and 25 mg of IV Demerol for adequate sedation and cough suppression.  The right naris and oropharynx were liberally anesthetized with 10% lidocaine spray. The right naris additionally prepared with 2% lidocaine jelly.  Using a standard flexible fiberoptic bronchoscope, the right naris was easily cannulated with good visualization of the entire oropharynx and larynx. The cords moved normally and there were no apparent upper airway lesions.  Using an additional 1% lidocaine as needed, the entire tracheobronchial tree was explored bilaterally with the following findings: All the  airways opened widely at the subsegmental level with no lesions seen.  DESCRIPTION OF PROCEDURE:  Using a wedge position in the left upper lobe, the area of density was reached adequately by transbronchial technique and a total of 4 biopsy specimens were obtained. Additionally, the left upper lobe was vigorously lavaged.  The patient tolerated the procedure well and a follow-up chest x-ray pending at the time of this dictation.  IMPRESSION:  Left upper lobe density, question etiology. I am going to continue to consider excisional biopsies, especially if this lesion shows any growth or if there are any suspicious cells by either washing or biopsy. Either way, the patient will be followed carefully in the outpatient setting. DD:  12/14/99 TD:  12/15/99 Job: [id number]

## 2010-08-20 NOTE — Op Note (Signed)
[facility]  Patient:    [name], [name] Visit Number: [id number] MRN: [id number]          Service Type: Attending:  Jamison Neighbor, M.D. Dictated by:   Jamison Neighbor, M.D. Proc. Date: 04/11/01   CC:         Genene Churn. Cyndie Chime, M.D.  Tera Mater. Clent Ridges, M.D.   Operative Report  PREOPERATIVE DIAGNOSIS:  Carcinoma of the prostate.  POSTOPERATIVE DIAGNOSIS:  Carcinoma of the prostate.  PROCEDURE:  Radical retropubic prostatectomy.  SURGEON:  Jamison Neighbor, M.D.  ASSISTANT:  Maretta Bees. Vonita Moss, M.D.  ANESTHESIA:  General.  COMPLICATIONS:  None.  DRAINS:  A 20 French Foley catheter and a #10 flat Jackson-Pratt drain.  BRIEF HISTORY:  This 77 year old male underwent ultrasound-guided biopsy because his PSA was moderately elevated at 4.19.  The PSA density was low.  No obvious lesions could be seen.  There was concern; however, that the PSA rose somewhat precipitously after the initiation of testosterone supplementation and for that reason, we recommended a biopsy.  The patient was biopsied and was found to have Gleason score 7 adenocarcinoma of the right-hand side in a very small portion of the prostate.  Left-hand biopsy was negative.  The patient was advised he had several options including watchful waiting, radiation therapy, radiation seed implantation as well as surgery.  The patient was felt to be an optimal candidate for radiation therapy with a small lesion, but he did not wish to undergo radiation therapy of any kind and requested that the surgery be performed.  The patient is not potent and for that reason, nerve sparing is not particularly important; however, the patient would like the nerve sparing option if at all possible. We are going to try and improve his incontinence rate.  The patient understands the risks and benefits of the procedure including the risk for impotence and incontinence and gave full and informed  consent.  DESCRIPTION OF PROCEDURE:  After the successful induction of general anesthesia, the patient was placed in the supine position with a small bump under his low back.  The table was slightly broken to improve exposure into the pelvis.  He was then prepped and draped in the usual sterile fashion.  A Foley catheter was inserted, and the bladder was drained.  A lower midline incision was made and was carried down through Scarpas fascia and subcutaneous tissue until the rectus sheath was identified.  The rectus sheath was opened in the midline and the rectus and perinealis were split.  The space of Retzius was developed in the usual fashion.  The Bookwalter was placed.  A pelvic lymph node dissection was performed on the right-hand side.  The patient was noted to have a large vein going across the lateral sidewall, and there was some bleeding that was controlled with suture ligatures.  These were noted on both sides.  The lymph nodes were sent for frozen section.  The endopelvic fascia was opened bilaterally.  The puboprostatic ligaments were taken down.  A Hohenfellner clamp was passed behind the dorsal vein complex. Two separate ties with #1 Vicryl were tied down, and the stump was then oversewn with a 2-0 Vicryl UR5 suture.  The back-bleeding stitch was placed on top of the prostate, and the dorsal vein complex was transected.  The underlying urethra was identified.  The top portion of the urethra was transected.  A right angle clamp was used to grasp the Foley catheter which was then cut.  The posterior portion of the urethra was then transected.  The prostate was elevated, exposing the Denonvilliers fascia.  Denonvilliers was opened, exposing the seminal vesicles.  The seminal vesicles were clipped and divided.  The pedicles to the lateral side of the prostate were carefully taken with some attempt at nerve sparing even though, as noted above, the patient is not potent.  The  lateral pedicles were taken down with silk ties. The bladder neck was then transected sharply, trying to preserve as much as the corpus collum fibers as possible.  The posterior aspect of the bladder neck was transected.  The seminal vesicles were then identified and clipped, and the specimen was removed.  An attempt was not made to remove all of the seminal vesicles.  The bladder neck was then tapered with two sutures of 2-0 Vicryl.  The ureteral orifices were identified and not injured.  The mucosa was everted with Vicryl sutures.  Double-armed sutures of 2-0 Vicryl were then placed at the 1 oclock, 5 oclock, 7 oclock, 11 oclock, and 12 oclock positions on the urethra using a Greenwald sound.  These were then placed in the appropriate location in the bladder neck and tied down.  After the first two sutures were placed but not tied, the Foley catheter was inserted, and it was inflated.  This was then fully inflated and used to pull the bladder down. The sutures were then tied with an appropriate degree of tension.  The bladder was irrigated, and no leak was seen.  The patient had a drain placed, and the drain was sutured in place with 2-0 silk.  The patient had a postoperative hematocrit of 31 obtained in the operating room.  The entire area was irrigated.  The sponge, needle, and instrument counts were ascertained to be correct.  The patient was closed with #1 PDS and surgical clips.  The patient tolerated the procedure well and was taken to the recovery room in good condition. Dictated by:   Jamison Neighbor, M.D. Attending:  Jamison Neighbor, M.D. DD:  04/11/01 TD:  04/11/01 Job: [id number]

## 2010-08-20 NOTE — H&P (Signed)
[facility]  Patient:    [name], [name] Visit Number: [id number] MRN: [id number]          Service Type: SUR Location: 1S X005 01 Attending Physician:  Londell Moh Dictated by:   Jamison Neighbor, M.D. Admit Date:  04/11/2001   CC:         Jeannett Senior A. Clent Ridges, M.D.  Genene Churn. Cyndie Chime, M.D.   History and Physical  ADMISSION DIAGNOSIS:  Carcinoma of the prostate.  SECONDARY DIAGNOSES: 1. Past history of carcinoma of the lung. 2. Hypertension.  HISTORY OF PRESENT ILLNESS:  This 77 year old male underwent prostate biopsy because of a rise in his PSA that occurred after testosterone supplementation.  He was found to have a small amount of prostate cancer on the right-hand side. The patient was given options for therapy, including close observation, radiation seed, radiation therapy, and radical prostatectomy, as well as discussion of hormone therapy, cryosurgery, and other options.  The patient was thought to be an optimal candidate for radiation therapy, however, he elected to undergo surgery as he did not feel that he could tolerate radiation therapy or radiation seeds.  The patient certainly is a reasonable candidate for surgery as well.  He is known to have erectile dysfunction, which he realizes will not get better after the surgery.  The patient understands about the potential for incontinence after surgery.  He gave full and informed consent and is to be admitted following a radical prostatectomy.  PAST MEDICAL HISTORY:  Remarkable for lung cancer for which he underwent a resection and has done quite well.  He is also known to have hypertension and takes Cardura 2 mg.  The patients only other procedure was a tonsillectomy as a child.  SOCIAL HISTORY:  Pertinent for smoking two to three packs a day for 20 years. He does not use alcohol.  REVIEW OF SYSTEMS:  Positive for some shortness of breath with exertion.  The patient has no  chronic GI problems, but has had colonoscopy on multiple occasions for precancerous polyps.  Otherwise noncontributory.  FAMILY HISTORY:  Noncontributory.  PHYSICAL EXAMINATION:  On examination today, temperature 98.2 degrees, pulse 78, respirations 22, and blood pressure 110/74.  GENERAL APPEARANCE:  He is a well-developed, well-nourished male in no acute distress.  HEENT:  Normocephalic and atraumatic.  Cranial nerves II-XII grossly intact.  NECK:  Supple.  No adenopathy or thyromegaly.  LUNGS:  Clear.  CHEST:  The patient has an incision from his lobectomy.  HEART:  Regular rate and rhythm with no murmurs, thrills, gallops, rubs, or heaves.  ABDOMEN:  Soft and nontender.  No palpable mass, rebound, or guarding.  GENITOURINARY:  Normal exam.  The testicles are normal in size and shape. There seems to be no hydrocele, spermatocele, varicocele, hernia, or adenopathy.  The penis is free of any lesions.  EXTREMITIES:  No clubbing, cyanosis, or edema.  NEUROLOGIC:  Grossly intact.  IMPRESSION:  Carcinoma of the prostate.  PLAN:  Admit following radical retropubic prostatectomy. Dictated by:   Jamison Neighbor, M.D. Attending Physician:  Londell Moh DD:  04/11/01 TD:  04/11/01 Job: [id number]

## 2010-09-06 ENCOUNTER — Other Ambulatory Visit: Payer: Self-pay | Admitting: *Deleted

## 2010-09-06 DIAGNOSIS — E119 Type 2 diabetes mellitus without complications: Secondary | ICD-10-CM

## 2010-09-06 DIAGNOSIS — E7800 Pure hypercholesterolemia, unspecified: Secondary | ICD-10-CM

## 2010-09-06 MED ORDER — ATORVASTATIN CALCIUM 40 MG PO TABS: 40.0000 mg | ORAL_TABLET | Freq: Every day | ORAL | Status: DC

## 2010-09-06 MED ORDER — METFORMIN HCL ER (OSM) 500 MG PO TB24: 500.0000 mg | ORAL_TABLET | Freq: Every day | ORAL | Status: DC

## 2010-09-13 ENCOUNTER — Other Ambulatory Visit: Payer: Self-pay | Admitting: *Deleted

## 2010-09-13 MED ORDER — SERTRALINE HCL 50 MG PO TABS: 50.0000 mg | ORAL_TABLET | Freq: Every day | ORAL | Status: DC

## 2010-10-31 ENCOUNTER — Other Ambulatory Visit: Payer: Self-pay | Admitting: Family Medicine

## 2010-11-11 ENCOUNTER — Other Ambulatory Visit: Payer: Self-pay | Admitting: Family Medicine

## 2010-12-08 ENCOUNTER — Encounter: Payer: Self-pay | Admitting: Family Medicine

## 2010-12-08 ENCOUNTER — Ambulatory Visit (INDEPENDENT_AMBULATORY_CARE_PROVIDER_SITE_OTHER): Payer: Medicare Other | Admitting: Family Medicine

## 2010-12-08 VITALS — BP 114/70 | HR 80 | Temp 98.4°F | Wt 194.5 lb

## 2010-12-08 DIAGNOSIS — L0291 Cutaneous abscess, unspecified: Secondary | ICD-10-CM

## 2010-12-08 DIAGNOSIS — Z8614 Personal history of Methicillin resistant Staphylococcus aureus infection: Secondary | ICD-10-CM

## 2010-12-08 DIAGNOSIS — L0390 Cellulitis, unspecified: Secondary | ICD-10-CM

## 2010-12-08 MED ORDER — MUPIROCIN 2 % EX OINT: TOPICAL_OINTMENT | CUTANEOUS | Status: DC

## 2010-12-08 NOTE — Progress Notes (Signed)
  Subjective:    Patient ID: [name], male    DOB: 05-06-1930, 77 y.o.   MRN: [id number]  HPI Pt of Dr Albertina Senegal here as acute appt. He has had skin cancer SCC, Prostate cancer and lung cancer. He has a papular 1 cm papular lesion with echymosis in the center and erythematous halo of another cm surrounding. This has been here for the last two weeks. He has had culture proven MRSA on his right finger in the past and has responded to Abs. This was painful. He was treated again for MRSA of the right leg.    Review of Systems  Constitutional: Negative for fever, chills, diaphoresis, activity change, appetite change and fatigue.  HENT: Negative for hearing loss, ear pain, congestion, sore throat, rhinorrhea, neck pain, neck stiffness, postnasal drip, sinus pressure, tinnitus and ear discharge.   Eyes: Negative for pain, discharge and visual disturbance.  Respiratory: Negative for cough, shortness of breath and wheezing.   Cardiovascular: Negative for chest pain and palpitations.       No SOB w/ exertion  Gastrointestinal:       No heartburn or swallowing problems.  Genitourinary:       No nocturia  Skin:       See HPI. 1 cm lesion surrounded by 1 cm erythem halo, no streaking seen.  Neurological:       No tingling or balance problems.  All other systems reviewed and are negative.       Objective:   Physical Exam  Constitutional: He appears well-developed and well-nourished. No distress.  HENT:  Head: Normocephalic and atraumatic.  Right Ear: External ear normal.  Left Ear: External ear normal.  Nose: Nose normal.  Mouth/Throat: Oropharynx is clear and moist.  Eyes: Conjunctivae and EOM are normal. Pupils are equal, round, and reactive to light. Right eye exhibits no discharge. Left eye exhibits no discharge.  Neck: Normal range of motion. Neck supple.  Cardiovascular: Normal rate and regular rhythm.   Pulmonary/Chest: Effort normal and breath sounds normal. He has no wheezes.   Lymphadenopathy:    He has no cervical adenopathy.  Skin: Skin is warm and dry. No rash noted. He is not diaphoretic.       1cm papular intensely red lesion with 1cm milkdly erythem haloed area surrounding. No streaking seen. Minimal tenderness.          Assessment & Plan:

## 2010-12-09 NOTE — Assessment & Plan Note (Signed)
Presumed recurrence of same. Bactroban applied with clean sterile dressing. Given script for Bactroban to be applied bid. Culture sent. Will treat orally as well if culture pos or sxs worsen. Call if sxs worsen.

## 2010-12-09 NOTE — Patient Instructions (Signed)
Call if lesion worsens or streaking ensues. Apply Bactroban twice a day.

## 2010-12-11 LAB — WOUND CULTURE
Gram Stain: NONE SEEN
Gram Stain: NONE SEEN

## 2010-12-29 LAB — CULTURE, ROUTINE-ABSCESS

## 2010-12-30 LAB — BASIC METABOLIC PANEL
BUN: 17
CO2: 27
Calcium: 9.5
Chloride: 102
Creatinine, Ser: 1.2
GFR calc Af Amer: >60
GFR calc non Af Amer: 59 — ABNORMAL LOW
Glucose, Bld: 183 — ABNORMAL HIGH
Potassium: 4.9
Sodium: 138

## 2010-12-30 LAB — HEMOGLOBIN AND HEMATOCRIT, BLOOD
HCT: 43.3
Hemoglobin: 14.8

## 2011-04-19 ENCOUNTER — Ambulatory Visit (HOSPITAL_BASED_OUTPATIENT_CLINIC_OR_DEPARTMENT_OTHER): Payer: Medicare Other

## 2011-04-19 ENCOUNTER — Other Ambulatory Visit: Payer: Self-pay | Admitting: *Deleted

## 2011-04-19 ENCOUNTER — Telehealth: Payer: Self-pay | Admitting: *Deleted

## 2011-04-19 ENCOUNTER — Ambulatory Visit (HOSPITAL_COMMUNITY)
Admission: RE | Admit: 2011-04-19 | Discharge: 2011-04-19 | Disposition: A | Discharge status: Home / Self Care | Payer: Medicare Other | Source: Ambulatory Visit | Attending: Oncology | Admitting: Oncology

## 2011-04-19 DIAGNOSIS — Z85118 Personal history of other malignant neoplasm of bronchus and lung: Secondary | ICD-10-CM

## 2011-04-19 DIAGNOSIS — R634 Abnormal weight loss: Secondary | ICD-10-CM

## 2011-04-19 DIAGNOSIS — R059 Cough, unspecified: Secondary | ICD-10-CM | POA: Insufficient documentation

## 2011-04-19 DIAGNOSIS — R05 Cough: Secondary | ICD-10-CM | POA: Insufficient documentation

## 2011-04-19 DIAGNOSIS — Z8546 Personal history of malignant neoplasm of prostate: Secondary | ICD-10-CM | POA: Insufficient documentation

## 2011-04-19 LAB — CBC WITH DIFFERENTIAL/PLATELET
BASO%: 0.1 % (ref 0.0–2.0)
Basophils Absolute: 0 10*3/uL (ref 0.0–0.1)
EOS%: 0.7 % (ref 0.0–7.0)
Eosinophils Absolute: 0.1 10*3/uL (ref 0.0–0.5)
HCT: 42.2 % (ref 38.4–49.9)
HGB: 14.7 g/dL (ref 13.0–17.1)
LYMPH%: 10.6 % — ABNORMAL LOW (ref 14.0–49.0)
MCH: 32.9 pg (ref 27.2–33.4)
MCHC: 34.9 g/dL (ref 32.0–36.0)
MCV: 94 fL (ref 79.3–98.0)
MONO#: 0.5 10*3/uL (ref 0.1–0.9)
MONO%: 6.3 % (ref 0.0–14.0)
NEUT#: 6.8 10*3/uL — ABNORMAL HIGH (ref 1.5–6.5)
NEUT%: 82.3 % — ABNORMAL HIGH (ref 39.0–75.0)
Platelets: 156 10*3/uL (ref 140–400)
RBC: 4.49 10*6/uL (ref 4.20–5.82)
RDW: 12.4 % (ref 11.0–14.6)
WBC: 8.2 10*3/uL (ref 4.0–10.3)
lymph#: 0.9 10*3/uL (ref 0.9–3.3)

## 2011-04-19 LAB — COMPREHENSIVE METABOLIC PANEL
ALT: 11 U/L (ref 0–53)
AST: 12 U/L (ref 0–37)
Albumin: 4 g/dL (ref 3.5–5.2)
Alkaline Phosphatase: 96 U/L (ref 39–117)
BUN: 16 mg/dL (ref 6–23)
CO2: 28 mEq/L (ref 19–32)
Calcium: 9 mg/dL (ref 8.4–10.5)
Chloride: 98 mEq/L (ref 96–112)
Creatinine, Ser: 1.09 mg/dL (ref 0.50–1.35)
Glucose, Bld: 386 mg/dL — ABNORMAL HIGH (ref 70–99)
Potassium: 5.1 mEq/L (ref 3.5–5.3)
Sodium: 134 mEq/L — ABNORMAL LOW (ref 135–145)
Total Bilirubin: 0.6 mg/dL (ref 0.3–1.2)
Total Protein: 6.4 g/dL (ref 6.0–8.3)

## 2011-04-19 LAB — T4: T4, Total: 7.6 ug/dL (ref 5.0–12.5)

## 2011-04-19 LAB — TSH: TSH: 1.11 u[IU]/mL (ref 0.350–4.500)

## 2011-04-19 LAB — LACTATE DEHYDROGENASE: LDH: 114 U/L (ref 94–250)

## 2011-04-19 NOTE — Telephone Encounter (Signed)
Received call from pt's wife stating pt is losing weight & has lost @ 16 lbs over last 3 mo & he's not trying.  He is eating & appetite has been good.  She states the pt is supposed to see Dr. Cyndie Chime in March but wonders if this should be moved up.  The only symptom she reports is a hacking cough over last 3 mo, more so at night but she hadn't thought anything about this since so much stuff is going around.  We discussed seeing PCP & she also reports that Dr. Randa Evens had sent them a notice of time to repeat colonoscopy & she plans to call them today.  Will discuss with Dr. Cyndie Chime,  Ms Chesnut can be reached at home [phone] or cell [phone].

## 2011-04-19 NOTE — Telephone Encounter (Signed)
Pt. Notified per Dr. Cyndie Chime that labs OK except blood sugar.  She reports that pt may not be taking metformin.  These labs were routed to Dr. Kerby Nora & Ms Conaway was asked to call their office in the am to f/u.  Encouraged her to have pt take his metformin as ordered.

## 2011-04-19 NOTE — Telephone Encounter (Signed)
Talked to wife they are coming today, appt scheduled for lab @ 1245pm and pt will go straight to [facility] radiology for chest x-ray

## 2011-04-19 NOTE — Progress Notes (Signed)
Pt's wife notified that scheduler should call her re: setting up cxr & labs & Dr. Cyndie Chime will review these & then set up MD appt sooner if needed.

## 2011-04-22 ENCOUNTER — Ambulatory Visit: Payer: Medicare Other | Admitting: Family Medicine

## 2011-04-25 ENCOUNTER — Ambulatory Visit (INDEPENDENT_AMBULATORY_CARE_PROVIDER_SITE_OTHER): Payer: Medicare Other | Admitting: Family Medicine

## 2011-04-25 ENCOUNTER — Telehealth: Payer: Self-pay | Admitting: Family Medicine

## 2011-04-25 ENCOUNTER — Encounter: Payer: Self-pay | Admitting: Family Medicine

## 2011-04-25 DIAGNOSIS — E119 Type 2 diabetes mellitus without complications: Secondary | ICD-10-CM

## 2011-04-25 DIAGNOSIS — E7800 Pure hypercholesterolemia, unspecified: Secondary | ICD-10-CM

## 2011-04-25 DIAGNOSIS — I10 Essential (primary) hypertension: Secondary | ICD-10-CM

## 2011-04-25 LAB — LIPID PANEL
Cholesterol: 112 mg/dL (ref 0–200)
HDL: 35.6 mg/dL — ABNORMAL LOW (ref >39.00)
LDL Cholesterol: 54 mg/dL (ref 0–99)
Total CHOL/HDL Ratio: 3
Triglycerides: 114 mg/dL (ref 0.0–149.0)
VLDL: 22.8 mg/dL (ref 0.0–40.0)

## 2011-04-25 LAB — COMPREHENSIVE METABOLIC PANEL
ALT: 14 U/L (ref 0–53)
AST: 16 U/L (ref 0–37)
Albumin: 3.8 g/dL (ref 3.5–5.2)
Alkaline Phosphatase: 91 U/L (ref 39–117)
BUN: 16 mg/dL (ref 6–23)
CO2: 30 mEq/L (ref 19–32)
Calcium: 8.7 mg/dL (ref 8.4–10.5)
Chloride: 98 mEq/L (ref 96–112)
Creatinine, Ser: 1.2 mg/dL (ref 0.4–1.5)
GFR: 65.28 mL/min (ref >60.00)
Glucose, Bld: 351 mg/dL — ABNORMAL HIGH (ref 70–99)
Potassium: 4.5 mEq/L (ref 3.5–5.1)
Sodium: 136 mEq/L (ref 135–145)
Total Bilirubin: 0.6 mg/dL (ref 0.3–1.2)
Total Protein: 6.8 g/dL (ref 6.0–8.3)

## 2011-04-25 LAB — HM DIABETES FOOT EXAM

## 2011-04-25 MED ORDER — METFORMIN HCL ER (OSM) 500 MG PO TB24: 1000.0000 mg | ORAL_TABLET | Freq: Every day | ORAL | Status: DC

## 2011-04-25 NOTE — Progress Notes (Signed)
  Subjective:    Patient ID: [name], male    DOB: [birth year], 78 y.o.   MRN: [id number]  HPI 78 year old male with lung cancer, prostate cancer returns overdue for 3 month follow up. Blood sugar high as below at cancer center.  Diabetes: ? control on metformin. Never returned for recheck. He has not been taking medication regularly (only took about 10 days worth), started back in last week. Lab Results  Component Value Date   HGBA1C 9.5* 08/06/2010  Using medications without difficulties: No SE, just didn't think he needed the medicine. Hypoglycemic episodes:? Hyperglycemic episodes:? Feet problems: NONE Blood Sugars averaging:not checking, but has ordered. eye exam within last year:yes  Has lost 16 lbs in last few months.  Had labs/CXR with Dr. Cyndie Chime everything fine except CBG was high, 386 nonfasting.  Hypertension:  Well controlled on  no med.  Using medication without problems or lightheadedness:  Chest pain with exertion: None Edema:None Short of breath:None Average home BPs:not checking. Other issues:  Elevated Cholesterol:On lipitor 40 mg daily. ? Control.. Never returned for recheck.  Using medications without problems: None Muscle aches: None Diet compliance:Very poor diet Exercise:NOne Other complaints:     Review of Systems  Constitutional: Positive for fatigue. Negative for fever.       Increased thirst  HENT: Negative for ear pain.   Eyes: Negative for pain.  Respiratory: Negative for shortness of breath.   Cardiovascular: Negative for chest pain.  Gastrointestinal: Negative for constipation and abdominal distention.  Genitourinary: Negative for frequency.       Objective:   Physical Exam  Constitutional: Vital signs are normal. He appears well-developed and well-nourished.  HENT:  Head: Normocephalic.  Right Ear: Hearing normal.  Left Ear: Hearing normal.  Nose: Nose normal.  Mouth/Throat: Oropharynx is clear and moist and mucous  membranes are normal.  Neck: Trachea normal. Carotid bruit is not present. No mass and no thyromegaly present.  Cardiovascular: Normal rate, regular rhythm and normal pulses.  Exam reveals no gallop, no distant heart sounds and no friction rub.   No murmur heard.      No peripheral edema  Pulmonary/Chest: Effort normal and breath sounds normal. No respiratory distress.  Skin: Skin is warm, dry and intact. No rash noted.  Psychiatric: He has a normal mood and affect. His speech is normal and behavior is normal. Thought content normal.  Diabetic foot exam: Normal inspection No skin breakdown No calluses  Normal DP pulses Normal sensation to light touch and monofilament Nails thickened.         Assessment & Plan:

## 2011-04-25 NOTE — Assessment & Plan Note (Signed)
Well controlled on no medication  

## 2011-04-25 NOTE — Patient Instructions (Signed)
Decrease sweets (candy), carbohydrates like bread, pasta, potatos. Call if interested in nutrition referral. We will call with lab results. Checking the blood at home... First thing in morning before food.. Goal is 80-120.  2 hours after meals, blood sugar should be less than 180. Call with blood sugar measurements in 1 week. Follow up in 3-4 months with fasting labs prior.

## 2011-04-25 NOTE — Telephone Encounter (Signed)
Cholesterol is at goal on new chol med. Blood sugar better but still very high.. Make diet changes as discussed and increase metformin to 500 mg 2 tabs po daily. Call with blood sugars fasting and 2 hours after meals in 1 week as discussed at today's OV.

## 2011-04-25 NOTE — Assessment & Plan Note (Signed)
Poor control. Refused referral to nutritionist. Encouraged exercise, weight loss, healthy eating habits. Getting meter at home.  Check CBG today on metformin.  Follow at home and call with measurements.  May need to adjust metformin., no SE. Recheck A1C in 3 months.

## 2011-04-25 NOTE — Assessment & Plan Note (Signed)
Due for re-eval. 

## 2011-04-25 NOTE — Telephone Encounter (Signed)
Patient's wife advised

## 2011-05-02 ENCOUNTER — Telehealth: Payer: Self-pay | Admitting: Family Medicine

## 2011-05-02 NOTE — Telephone Encounter (Signed)
Triage Record Num: [id number] Operator: Neita Goodnight Patient Name: [name] Call Date & Time: 05/02/2011 10:12:55AM Patient Phone: [phone] PCP: Kerby Nora Patient Gender: Male PCP Fax : [phone] Patient DOB: 1931/03/30 Practice Name: [facility] Reason for Call: Caller: [name]/Spouse; PCP: Excell Seltzer.; CB#: [phone]; Call regarding Blood Sugar Has Been High. Yesterday Evening Was 353 No Meter To Check Today No Symptoms; last time it was checked was 01/27 at 1500, was 353. Received call approximately 1 1/2 weeks ago from "somewhere" asking if she needed a meter to check her own blood sugars, informed her it was approved by Dr. Ermalene Searing, but has never received meter. Emergent sx negative. Care advice per "Diabetes: Control Problems" with appt advised within 4h due to "new or iincreasing symptoms, or glucose out of control as defined by provider or action plan and taking medications/following therapy as prescribed." Caller does not agree to scheduling appt, just "wants a meter." No note in either caller's chart or husband's about a meter. Per caller, she was told by "whatever the company that called her (unsure if it was [facility] or not) that her new meter and strips would arrive around Jan 23, but has not received anything." Has zero out of pocket copay for DME. Was also told by Dr. Ermalene Searing "the office has a meter she can borrow if needed." OFFICE PLEASE CALL [name] AT [phone] AND ADVISE IF METER IS AVAILABLE FROM OFFICE, OR IF CAN BE CALLED INTO [facility], OR IF RX CAN BE SENT TO DME MAIL ORDER PROVIDER. DOES NOT WANT TO SCHEDULE AN APPT UNTIL THEY CAN GET SOME BG READINGS. Protocol(s) Used: Diabetes: Control Problems Recommended Outcome per Protocol: See Provider within 4 hours Reason for Outcome: New or increasing symptoms or glucose out of control as defined by provider or action plan AND taking medications/following therapy  as prescribed Care Advice: ~ Call provider if symptoms worsen before scheduled appointment. ~ Check blood sugar and ketones before calling provider

## 2011-05-02 NOTE — Telephone Encounter (Signed)
Patients wife advised we have samples of diabetes meter

## 2011-05-06 ENCOUNTER — Telehealth: Payer: Self-pay | Admitting: *Deleted

## 2011-05-06 MED ORDER — METFORMIN HCL ER (OSM) 500 MG PO TB24: ORAL_TABLET | ORAL | Status: DC

## 2011-05-06 NOTE — Telephone Encounter (Signed)
Received a faxed message from Call A Nurse from [facility] calling to give Dr. Ermalene Searing an update on patients BS readings.  [name] stated that since patient was changed to Metformin the lowest BS reading was 240 and the highest was over 400, but it stayed mostly in the 300 range.  Spoke with Dr. Ermalene Searing and she wanted to make the following chages:   1) Have patient increase Metformin to three tablets at breakfast  2) Call with fasting BS readings in one week.   Patients spouse Janit Bern was advised as instructed via telephone.  [facility].

## 2011-05-09 ENCOUNTER — Telehealth: Payer: Self-pay | Admitting: Family Medicine

## 2011-05-09 NOTE — Telephone Encounter (Signed)
Patient advised.

## 2011-05-09 NOTE — Telephone Encounter (Signed)
Triage Record Num: [id number] Operator: Hillary Bow Patient Name: [name] Call Date & Time: 05/06/2011 3:54:25PM Patient Phone: [phone] PCP: [name] Patient Gender: Male PCP Fax : [phone] Patient DOB: Aug 20, 1930 Practice Name: [facility] Reason for Call: Caller: [name]/Spouse; PCP: [name] E.; CB#: [phone]; ; ; Call regarding FSBS ranging b/n 207-417 on 2-1. FSBS = 417 at 0945. FSBS = 207 at 1520. Pt takes 1000mg  Metformin daily. Per Wife, [name] wanting FSBS results. Per wife, Pt has been on sugar free diet week of 1-28. Diabetes Control Protocol used. Consulted w/ Office, [name] will review and contact Pt directly, [phone]. Protocol(s) Used: Diabetes: Control Problems Recommended Outcome per Protocol: See Provider within 4 hours Reason for Outcome: New or increasing symptoms or glucose out of control as defined by provider or action plan AND taking medications/following therapy as prescribed Care Advice: ~ 05/06/2011 4:04:49PM Page 1 of 1 [email or web address]

## 2011-05-09 NOTE — Telephone Encounter (Signed)
Increase to metformin XR 500 mg, 3 tablets daily.  Recheck in office in 3 weeks, bring BS log

## 2011-05-15 ENCOUNTER — Telehealth: Payer: Self-pay | Admitting: Family Medicine

## 2011-05-15 NOTE — Telephone Encounter (Signed)
Sugars reviewed. Have him increase to 4 metformin ER tablets a day and f/u with me in the next 2-3 weeks

## 2011-05-16 NOTE — Telephone Encounter (Signed)
Patients wife advised and appt made

## 2011-05-30 ENCOUNTER — Telehealth: Payer: Self-pay | Admitting: Oncology

## 2011-05-30 ENCOUNTER — Other Ambulatory Visit: Payer: Self-pay | Admitting: Oncology

## 2011-05-30 DIAGNOSIS — C3490 Malignant neoplasm of unspecified part of unspecified bronchus or lung: Secondary | ICD-10-CM

## 2011-05-30 NOTE — Telephone Encounter (Signed)
S/w the pt's wife and she is aware of the April 2013 appts along with the cxr

## 2011-06-03 ENCOUNTER — Other Ambulatory Visit: Payer: Self-pay | Admitting: Gastroenterology

## 2011-06-06 ENCOUNTER — Ambulatory Visit: Payer: Medicare Other | Admitting: Family Medicine

## 2011-06-15 ENCOUNTER — Encounter: Payer: Self-pay | Admitting: Family Medicine

## 2011-06-15 ENCOUNTER — Ambulatory Visit (INDEPENDENT_AMBULATORY_CARE_PROVIDER_SITE_OTHER): Payer: Medicare Other | Admitting: Family Medicine

## 2011-06-15 VITALS — BP 124/72 | HR 80 | Temp 97.7°F | Wt 180.8 lb

## 2011-06-15 DIAGNOSIS — J019 Acute sinusitis, unspecified: Secondary | ICD-10-CM | POA: Insufficient documentation

## 2011-06-15 MED ORDER — AMOXICILLIN-POT CLAVULANATE 875-125 MG PO TABS: 1.0000 | ORAL_TABLET | Freq: Two times a day (BID) | ORAL | Status: AC

## 2011-06-15 NOTE — Patient Instructions (Signed)
You have a sinus infection. Take medicine as prescribed: augmentin twice daily for 10 days Push fluids and plenty of rest. Nasal saline irrigation or neti pot to help drain sinuses. May use simple mucinex with plenty of fluid to help mobilize mucous. Let us know if fever >101.5, trouble opening/closing mouth, difficulty swallowing, or worsening - you may need to be seen again. 

## 2011-06-15 NOTE — Progress Notes (Signed)
  Subjective:    Patient ID: [name], male    DOB: September 05, 1930, 78 y.o.   MRN: [id number]  HPI CC: cough  Presents with wife.  3 wk h/o coughing, sinus congestion.  Sinus pressure and pain as well.  Cough productive of dark sputum.  currently more congestion in head.  + PNdrainage.  Taking mucinex.  No fevers/chills, abd pain, n/v, rashes.  No chest pain, SOB.  No ear pain or tooth pain.  Wife and grandson (goes to daycare) sick at home.  No smokers at home.  No h/o asthma, COPD.  + h/o lung cancer.  Past Medical History  Diagnosis Date  . Unspecified essential hypertension   . Alzheimer's disease   . Anxiety states   . Malignant neoplasm of prostate   . Cellulitis and abscess of unspecified site   . Personal history of malignant neoplasm of bronchus and lung   . Squamous cell carcinoma of skin    Review of Systems Per HPI    Objective:   Physical Exam  Constitutional: He appears well-developed and well-nourished. No distress.  HENT:  Head: Normocephalic and atraumatic.  Right Ear: Hearing, tympanic membrane, external ear and ear canal normal.  Left Ear: Hearing, tympanic membrane, external ear and ear canal normal.  Nose: Nose normal. No mucosal edema or rhinorrhea. Right sinus exhibits no maxillary sinus tenderness and no frontal sinus tenderness. Left sinus exhibits no maxillary sinus tenderness and no frontal sinus tenderness.  Mouth/Throat: Uvula is midline, oropharynx is clear and moist and mucous membranes are normal. No oropharyngeal exudate, posterior oropharyngeal edema, posterior oropharyngeal erythema or tonsillar abscesses.  Eyes: Conjunctivae and EOM are normal. Pupils are equal, round, and reactive to light. No scleral icterus.  Neck: Normal range of motion. Neck supple. No thyromegaly present.  Cardiovascular: Normal rate, regular rhythm, normal heart sounds and intact distal pulses.   No murmur heard. Pulmonary/Chest: Effort normal and breath sounds  normal. No respiratory distress. He has no wheezes. He has no rales.  Lymphadenopathy:    He has no cervical adenopathy.  Skin: Skin is warm and dry. No rash noted.       Assessment & Plan:

## 2011-06-15 NOTE — Assessment & Plan Note (Signed)
Anticipate sinusitis with PNDrainage leading to cough. Given duration, will treat with course of augmentin. Update [place] if not improving as expected. See pt instructions.

## 2011-06-25 ENCOUNTER — Other Ambulatory Visit: Payer: Self-pay | Admitting: Family Medicine

## 2011-06-29 ENCOUNTER — Other Ambulatory Visit: Payer: Self-pay

## 2011-06-29 MED ORDER — METFORMIN HCL ER (OSM) 500 MG PO TB24: ORAL_TABLET | ORAL | Status: DC

## 2011-06-29 NOTE — Telephone Encounter (Signed)
[facility] faxed refill request Donepezil 10 mg. On 11/02/10 filled #90 x 3 refills at [facility]. Is it OK to refill? [facility] faxed refill request Metformin XR 500 mg #360 X 3 taking 4 tabs at breakfast. Pt last seen 06/15/11.(sent metformin to [facility] in error and I called [facility] and cancelled rx and sent electronically to [facility].

## 2011-07-03 MED ORDER — DONEPEZIL HCL 10 MG PO TABS: 10.0000 mg | ORAL_TABLET | Freq: Every day | ORAL | Status: DC

## 2011-07-07 ENCOUNTER — Telehealth: Payer: Self-pay | Admitting: *Deleted

## 2011-07-07 NOTE — Telephone Encounter (Signed)
Patients wife called stating that [facility] stated that we called in brand name Metformin for [name] and the cost is $200.00.  She stated that they have been paying $17.00 for this Rx and wants to know why it is so high.  Patient is taking Metformin ER 500 four tablets by mouth at breakfast.  Please advise.

## 2011-07-08 NOTE — Telephone Encounter (Signed)
This appears to be an ordering error.   Refilled by Rena. Be conscious in ordering / refilling in Epic that the first medication that shows up on query may be trade med and be very careful to make sure generic is refilled.   Nikki: can you show her what I mean? Please correct and change to metformin ER  Please let family know that it was an ordering error - branded med sent by accident. Metformin should be very cheap. Our apologies.

## 2011-07-08 NOTE — Telephone Encounter (Signed)
Reviewed meds with another CMA and refill was sent in as previously requested.DAW was not marked and spoke with Marisue Ivan pharmacist at [facility]. Med was dispensed generic per Marisue Ivan as previously dispensed.Marisue Ivan was not sure if pt had new deductible or co pay since last refill 12/23/10. Marisue Ivan suggest pt contact insurance co. Spoke with Veatrice Kells and apologized for any inconvenience and explained what Marisue Ivan advised. Mrs Cromie said she would call Marisue Ivan at [facility].Asked Ms Deal if there was any thing else I could do to help. Ms Nawabi said no and thanked me.

## 2011-07-20 ENCOUNTER — Ambulatory Visit (HOSPITAL_COMMUNITY)
Admission: RE | Admit: 2011-07-20 | Discharge: 2011-07-20 | Disposition: A | Discharge status: Home / Self Care | Payer: Medicare Other | Source: Ambulatory Visit | Attending: Oncology | Admitting: Oncology

## 2011-07-20 ENCOUNTER — Other Ambulatory Visit (HOSPITAL_BASED_OUTPATIENT_CLINIC_OR_DEPARTMENT_OTHER): Payer: Medicare Other | Admitting: Lab

## 2011-07-20 DIAGNOSIS — R059 Cough, unspecified: Secondary | ICD-10-CM | POA: Insufficient documentation

## 2011-07-20 DIAGNOSIS — R05 Cough: Secondary | ICD-10-CM | POA: Insufficient documentation

## 2011-07-20 DIAGNOSIS — Z8546 Personal history of malignant neoplasm of prostate: Secondary | ICD-10-CM

## 2011-07-20 DIAGNOSIS — R0989 Other specified symptoms and signs involving the circulatory and respiratory systems: Secondary | ICD-10-CM | POA: Insufficient documentation

## 2011-07-20 DIAGNOSIS — Z9889 Other specified postprocedural states: Secondary | ICD-10-CM | POA: Insufficient documentation

## 2011-07-20 DIAGNOSIS — C3490 Malignant neoplasm of unspecified part of unspecified bronchus or lung: Secondary | ICD-10-CM

## 2011-07-20 DIAGNOSIS — Z85118 Personal history of other malignant neoplasm of bronchus and lung: Secondary | ICD-10-CM

## 2011-07-20 LAB — COMPREHENSIVE METABOLIC PANEL
ALT: 12 U/L (ref 0–53)
AST: 14 U/L (ref 0–37)
Albumin: 3.9 g/dL (ref 3.5–5.2)
Alkaline Phosphatase: 75 U/L (ref 39–117)
BUN: 12 mg/dL (ref 6–23)
CO2: 29 mEq/L (ref 19–32)
Calcium: 9 mg/dL (ref 8.4–10.5)
Chloride: 103 mEq/L (ref 96–112)
Creatinine, Ser: 0.9 mg/dL (ref 0.50–1.35)
Glucose, Bld: 151 mg/dL — ABNORMAL HIGH (ref 70–99)
Potassium: 4.6 mEq/L (ref 3.5–5.3)
Sodium: 138 mEq/L (ref 135–145)
Total Bilirubin: 0.4 mg/dL (ref 0.3–1.2)
Total Protein: 6.3 g/dL (ref 6.0–8.3)

## 2011-07-20 LAB — PSA: PSA: <0.01 ng/mL (ref <=4.00)

## 2011-07-20 LAB — CBC WITH DIFFERENTIAL/PLATELET
BASO%: 0.3 % (ref 0.0–2.0)
Basophils Absolute: 0 10*3/uL (ref 0.0–0.1)
EOS%: 1.8 % (ref 0.0–7.0)
Eosinophils Absolute: 0.2 10*3/uL (ref 0.0–0.5)
HCT: 39.1 % (ref 38.4–49.9)
HGB: 13.3 g/dL (ref 13.0–17.1)
LYMPH%: 11 % — ABNORMAL LOW (ref 14.0–49.0)
MCH: 32.8 pg (ref 27.2–33.4)
MCHC: 34 g/dL (ref 32.0–36.0)
MCV: 96.2 fL (ref 79.3–98.0)
MONO#: 0.8 10*3/uL (ref 0.1–0.9)
MONO%: 8.7 % (ref 0.0–14.0)
NEUT#: 7 10*3/uL — ABNORMAL HIGH (ref 1.5–6.5)
NEUT%: 78.2 % — ABNORMAL HIGH (ref 39.0–75.0)
Platelets: 145 10*3/uL (ref 140–400)
RBC: 4.07 10*6/uL — ABNORMAL LOW (ref 4.20–5.82)
RDW: 13.9 % (ref 11.0–14.6)
WBC: 9 10*3/uL (ref 4.0–10.3)
lymph#: 1 10*3/uL (ref 0.9–3.3)
nRBC: 0 % (ref 0–0)

## 2011-07-20 LAB — LACTATE DEHYDROGENASE: LDH: 102 U/L (ref 94–250)

## 2011-07-25 ENCOUNTER — Other Ambulatory Visit: Payer: Self-pay | Admitting: *Deleted

## 2011-07-25 ENCOUNTER — Ambulatory Visit (HOSPITAL_BASED_OUTPATIENT_CLINIC_OR_DEPARTMENT_OTHER): Payer: Medicare Other | Admitting: Oncology

## 2011-07-25 ENCOUNTER — Telehealth: Payer: Self-pay | Admitting: Oncology

## 2011-07-25 ENCOUNTER — Encounter: Payer: Self-pay | Admitting: Oncology

## 2011-07-25 VITALS — BP 131/74 | HR 96 | Temp 97.6°F | Ht 71.0 in | Wt 180.9 lb

## 2011-07-25 DIAGNOSIS — Z860100 Personal history of colon polyps, unspecified: Secondary | ICD-10-CM

## 2011-07-25 DIAGNOSIS — C4490 Unspecified malignant neoplasm of skin, unspecified: Secondary | ICD-10-CM

## 2011-07-25 DIAGNOSIS — C3410 Malignant neoplasm of upper lobe, unspecified bronchus or lung: Secondary | ICD-10-CM

## 2011-07-25 DIAGNOSIS — Z85828 Personal history of other malignant neoplasm of skin: Secondary | ICD-10-CM

## 2011-07-25 DIAGNOSIS — C3492 Malignant neoplasm of unspecified part of left bronchus or lung: Secondary | ICD-10-CM | POA: Insufficient documentation

## 2011-07-25 DIAGNOSIS — C3430 Malignant neoplasm of lower lobe, unspecified bronchus or lung: Secondary | ICD-10-CM

## 2011-07-25 DIAGNOSIS — C61 Malignant neoplasm of prostate: Secondary | ICD-10-CM

## 2011-07-25 DIAGNOSIS — Z8546 Personal history of malignant neoplasm of prostate: Secondary | ICD-10-CM

## 2011-07-25 HISTORY — DX: Unspecified malignant neoplasm of skin, unspecified: C44.90

## 2011-07-25 HISTORY — DX: Personal history of colon polyps, unspecified: Z86.0100

## 2011-07-25 HISTORY — DX: Personal history of colonic polyps: Z86.010

## 2011-07-25 MED ORDER — SERTRALINE HCL 50 MG PO TABS: 50.0000 mg | ORAL_TABLET | Freq: Every day | ORAL | Status: DC

## 2011-07-25 NOTE — Telephone Encounter (Signed)
Gave pt appt for lab on 07/20/12 lab and Chest x-ray , then see md on 07/24/12

## 2011-07-25 NOTE — Progress Notes (Signed)
Hematology and Oncology Follow Up Visit  [name] [id number] [birth year] 78 y.o. 07/25/2011 8:31 PM   Principle Diagnosis: Encounter Diagnoses  Name Primary?  . Lung cancer, lower lobe Yes  . Prostate ca   . Hx of colonic polyps   . Non-melanoma skin cancer   . Nonmelanoma skin cancer   . History of colonic polyps      Interim History:  Followup visit for this 78 year old man with history of resected stage I lung cancer and prostate cancer status post radical prostatectomy. He is doing well. He has had no interim medical problems. Over the years he has developed mild Alzheimer's type dementia but is still quite cooperative and interactive.   Medications: reviewed  Allergies:  Allergies  Allergen Reactions  . Sulfa Antibiotics Nausea Only    Review of Systems: Constitutional:  No constitutional symptoms  Respiratory: Flare up of seasonal rhinitis and bronchitis Cardiovascular:  No ischemic type chest pain pressure or palpitations Gastrointestinal: No change in bowel habit no hematochezia Genito-Urinary: He has had leakage of urine ever since a urethral dilatation procedure. No hematuria Musculoskeletal: No bone pain Neurologic: No headache no change in vision Skin: No rash Remaining ROS negative. He reports a colonoscopy done about 3 months ago by Dr. Carman Ching was reported to him as normal.  Physical Exam: Blood pressure 131/74, pulse 96, temperature 97.6 F (36.4 C), temperature source Oral, height 5\' 11"  (1.803 m), weight 180 lb 14.4 oz (82.056 kg). Wt Readings from Last 3 Encounters:  07/25/11 180 lb 14.4 oz (82.056 kg)  06/15/11 180 lb 12 oz (81.988 kg)  04/25/11 187 lb (84.823 kg)     General appearance: Thin Caucasian man HENNT: Pharynx no erythema or exudate Lymph nodes: No lymphadenopathy Breasts: Lungs: Clear to auscultation resonant to percussion Heart: Regular rhythm no murmur Abdomen: Soft nontender no mass no organomegaly Extremities:  No edema no calf tenderness Vascular: No cyanosis Neurologic: He appears alert, oriented, and cooperative. Formal mental status exam not done. Motor strength is 5 over 5. Pupils equal reactive to light. Optic disc sharp. Reflexes 1+ symmetric. Skin: Scars in his face status post excision of previous nonmelanoma skin cancers  Lab Results: Lab Results  Component Value Date   WBC 9.0 07/20/2011   HGB 13.3 07/20/2011   HCT 39.1 07/20/2011   MCV 96.2 07/20/2011   PLT 145 07/20/2011     Chemistry      Component Value Date/Time   NA 138 07/20/2011 1125   K 4.6 07/20/2011 1125   CL 103 07/20/2011 1125   CO2 29 07/20/2011 1125   BUN 12 07/20/2011 1125   CREATININE 0.90 07/20/2011 1125      Component Value Date/Time   CALCIUM 9.0 07/20/2011 1125   ALKPHOS 75 07/20/2011 1125   AST 14 07/20/2011 1125   ALT 12 07/20/2011 1125   BILITOT 0.4 07/20/2011 1125    PSA less than 0.01   Radiological Studies: Chest radiograph done April 17 which I personally reviewed shows chronic postsurgical changes on the left otherwise normal.   Impression and Plan:  1. Stage I (T1 N0) adenocarcinoma left lung, resected 01/03/00.  No evidence for recurrent disease.  2. Gleason 7 prostate cancer, status post radical prostatectomy 04/11/01.  No evidence for recurrence. 3. Status post excision multiple non-melanoma skin cancers. No obvious new lesions. 4. Persistent elevation of random glucose.  He finally did followup with his family physician and was started on Glucophage 500 mg daily.   5.  History of benign colon polyps. 6. Early dementia on Aricept and Zoloft 7. Hyperlipidemia on Lipitor    CC:. Dr. Kerby Nora; Dr. Nathanial Rancher; Dr. Marcelyn Bruins, [facility] , urology   Levert Feinstein, MD 4/22/20138:31 PM

## 2011-08-05 ENCOUNTER — Other Ambulatory Visit: Payer: Self-pay | Admitting: *Deleted

## 2011-08-05 MED ORDER — METFORMIN HCL ER (MOD) 500 MG PO TB24: 500.0000 mg | ORAL_TABLET | Freq: Four times a day (QID) | ORAL | Status: DC

## 2011-09-24 ENCOUNTER — Other Ambulatory Visit: Payer: Self-pay | Admitting: Family Medicine

## 2011-11-14 ENCOUNTER — Other Ambulatory Visit: Payer: Self-pay | Admitting: *Deleted

## 2011-11-15 MED ORDER — DONEPEZIL HCL 10 MG PO TABS: 10.0000 mg | ORAL_TABLET | Freq: Every day | ORAL | Status: DC

## 2012-03-14 ENCOUNTER — Encounter (INDEPENDENT_AMBULATORY_CARE_PROVIDER_SITE_OTHER): Payer: Medicare Other | Admitting: Ophthalmology

## 2012-03-14 DIAGNOSIS — E1139 Type 2 diabetes mellitus with other diabetic ophthalmic complication: Secondary | ICD-10-CM

## 2012-03-14 DIAGNOSIS — H3530 Unspecified macular degeneration: Secondary | ICD-10-CM

## 2012-03-14 DIAGNOSIS — E11319 Type 2 diabetes mellitus with unspecified diabetic retinopathy without macular edema: Secondary | ICD-10-CM

## 2012-03-14 DIAGNOSIS — E1165 Type 2 diabetes mellitus with hyperglycemia: Secondary | ICD-10-CM

## 2012-03-14 DIAGNOSIS — H2510 Age-related nuclear cataract, unspecified eye: Secondary | ICD-10-CM

## 2012-03-14 DIAGNOSIS — H43819 Vitreous degeneration, unspecified eye: Secondary | ICD-10-CM

## 2012-04-04 DIAGNOSIS — I2510 Atherosclerotic heart disease of native coronary artery without angina pectoris: Secondary | ICD-10-CM

## 2012-04-04 HISTORY — DX: Atherosclerotic heart disease of native coronary artery without angina pectoris: I25.10

## 2012-04-16 ENCOUNTER — Telehealth: Payer: Self-pay | Admitting: Family Medicine

## 2012-04-16 NOTE — Telephone Encounter (Signed)
Patient Information:  Caller Name: Janit Bern  Phone: [phone]  Patient: [name], [name]  Gender: Male  DOB: 04/27/1930  Age: 79 Years  PCP: Kerby Nora (Family Practice)  Office Follow Up:  Does the office need to follow up with this patient?: No  Instructions For The Office: N/A  RN Note:  appointment for follow up of blood pressure  Symptoms  Reason For Call & Symptoms: Blood pressure was elevated at 183/109 earlier this morning at 11:00am, it is 163/104 now. He has slight dizziness at times. Denies SOB or Chest pain. His blood sugar is 176. Wife states he used to be on blood pressure medication and not sure why he is not on any now, medications verified in EPIC.  Reviewed Health History In EMR: Yes  Reviewed Medications In EMR: Yes  Reviewed Allergies In EMR: Yes  Reviewed Surgeries / Procedures: Yes  Date of Onset of Symptoms: 04/16/2012  Guideline(s) Used:  High Blood Pressure  Disposition Per Guideline:   See Within 2 Weeks in Office  Reason For Disposition Reached:   BP > 160/100  Advice Given:  Lifestyle Changes  Do 30 minutes of aerobic physical activity (e.g., brisk walking) most days of the week.  Eat a diet high in fresh fruits and low-fat dairy products. Limit your intake of saturated and total fat. Choose foods that are lower in salt.  Call Back If:  Headache, blurred vision, difficulty talking, or difficulty walking occurs  Chest pain or difficulty breathing occurs  You become worse.  Appointment Scheduled:  04/17/2012 10:45:00 Appointment Scheduled Provider:  Kerby Nora [facility])

## 2012-04-17 ENCOUNTER — Encounter: Payer: Self-pay | Admitting: Family Medicine

## 2012-04-17 ENCOUNTER — Ambulatory Visit (INDEPENDENT_AMBULATORY_CARE_PROVIDER_SITE_OTHER): Payer: Medicare Other | Admitting: Family Medicine

## 2012-04-17 VITALS — BP 120/72 | HR 88 | Temp 98.0°F | Ht 71.0 in | Wt 190.0 lb

## 2012-04-17 DIAGNOSIS — I10 Essential (primary) hypertension: Secondary | ICD-10-CM

## 2012-04-17 NOTE — Assessment & Plan Note (Signed)
Follow BPs at home x 1-2 week.  If running consistently > 140/90 we will start a medication.

## 2012-04-17 NOTE — Progress Notes (Signed)
  Subjective:    Patient ID: [name], male    DOB: [birth year], 79 y.o.   MRN: [id number]  HPI  79 year old male with dementia, lung cancer, increase cholesterol DM and anxiety presents with recent elevated BPs.  He has been somewhat dizzy lately Has history of BPPV.. Dramamine helped. Face has been red lately. Yesterday BP was 189/110 CBG was 200  Last BP 07/2011: 131/74  Hypertension:  On no medication. Previously on doxazosin but he stopped don his own for no reason few years ago. Using medication without problems or lightheadedness:  Chest pain with exertion: None Edema:None Short of breath: mild at baseline Other issues: Only new med allegra.. Face gets red after this some. No recent change in mood. No increase in stress.   Has gained 10 lbs since 07/2011. Wt Readings from Last 3 Encounters:  04/17/12 190 lb (86.183 kg)  07/25/11 180 lb 14.4 oz (82.056 kg)  06/15/11 180 lb 12 oz (81.988 kg)     Review of Systems  Constitutional: Negative for fever and fatigue.  HENT: Negative for ear pain.   Eyes: Negative for pain.  Respiratory: Positive for shortness of breath. Negative for chest tightness and wheezing.        At baseline  Gastrointestinal: Negative for abdominal pain.       Objective:   Physical Exam  Constitutional: Vital signs are normal. He appears well-developed and well-nourished.  HENT:  Head: Normocephalic.  Right Ear: Hearing normal.  Left Ear: Hearing normal.  Nose: Nose normal.  Mouth/Throat: Oropharynx is clear and moist and mucous membranes are normal.  Neck: Trachea normal. Carotid bruit is not present. No mass and no thyromegaly present.  Cardiovascular: Normal rate, regular rhythm and normal pulses.  Exam reveals no gallop, no distant heart sounds and no friction rub.   No murmur heard.      No peripheral edema  Pulmonary/Chest: Effort normal and breath sounds normal. No respiratory distress.  Skin: Skin is warm, dry and intact. No  rash noted.  Psychiatric: He has a normal mood and affect. His speech is normal and behavior is normal. Thought content normal.          Assessment & Plan:

## 2012-04-17 NOTE — Patient Instructions (Signed)
Follow BPs at home x 1-2 week. Call or send via MyChart.  Work on [employer], regular exercise and weight loss.  If running consistently > 140/90 we will start a medication.

## 2012-04-18 ENCOUNTER — Encounter: Payer: Self-pay | Admitting: Family Medicine

## 2012-04-19 ENCOUNTER — Encounter (INDEPENDENT_AMBULATORY_CARE_PROVIDER_SITE_OTHER): Payer: Medicare Other | Admitting: Ophthalmology

## 2012-04-19 DIAGNOSIS — H43819 Vitreous degeneration, unspecified eye: Secondary | ICD-10-CM

## 2012-04-19 DIAGNOSIS — H2510 Age-related nuclear cataract, unspecified eye: Secondary | ICD-10-CM

## 2012-04-19 DIAGNOSIS — E1165 Type 2 diabetes mellitus with hyperglycemia: Secondary | ICD-10-CM

## 2012-04-19 DIAGNOSIS — E1139 Type 2 diabetes mellitus with other diabetic ophthalmic complication: Secondary | ICD-10-CM

## 2012-04-19 DIAGNOSIS — H3530 Unspecified macular degeneration: Secondary | ICD-10-CM

## 2012-04-19 DIAGNOSIS — E11319 Type 2 diabetes mellitus with unspecified diabetic retinopathy without macular edema: Secondary | ICD-10-CM

## 2012-05-08 ENCOUNTER — Other Ambulatory Visit: Payer: Self-pay

## 2012-05-08 ENCOUNTER — Other Ambulatory Visit: Payer: Self-pay | Admitting: Family Medicine

## 2012-05-08 MED ORDER — DONEPEZIL HCL 10 MG PO TABS: 10.0000 mg | ORAL_TABLET | Freq: Every day | ORAL | Status: DC

## 2012-05-08 NOTE — Telephone Encounter (Signed)
pts wife request refill aricept with 90 days to [facility] # [id number]. pts wife notified done.

## 2012-05-12 LAB — HM DIABETES EYE EXAM

## 2012-05-19 ENCOUNTER — Other Ambulatory Visit: Payer: Self-pay

## 2012-06-21 ENCOUNTER — Encounter (INDEPENDENT_AMBULATORY_CARE_PROVIDER_SITE_OTHER): Payer: Medicare Other | Admitting: Ophthalmology

## 2012-06-21 DIAGNOSIS — H2510 Age-related nuclear cataract, unspecified eye: Secondary | ICD-10-CM

## 2012-06-21 DIAGNOSIS — H3530 Unspecified macular degeneration: Secondary | ICD-10-CM

## 2012-06-21 DIAGNOSIS — H43819 Vitreous degeneration, unspecified eye: Secondary | ICD-10-CM

## 2012-07-06 ENCOUNTER — Encounter: Payer: Self-pay | Admitting: Family Medicine

## 2012-07-06 ENCOUNTER — Ambulatory Visit (INDEPENDENT_AMBULATORY_CARE_PROVIDER_SITE_OTHER): Payer: Medicare Other | Admitting: Family Medicine

## 2012-07-06 VITALS — BP 120/74 | HR 88 | Temp 98.7°F | Ht 71.0 in | Wt 191.8 lb

## 2012-07-06 DIAGNOSIS — J209 Acute bronchitis, unspecified: Secondary | ICD-10-CM

## 2012-07-06 MED ORDER — AZITHROMYCIN 250 MG PO TABS: ORAL_TABLET | ORAL | Status: DC

## 2012-07-06 NOTE — Patient Instructions (Signed)
Start on antibiotics.  Continue mucinex DM day and noight.  Call if  Fever on antibiotics or worsening  in 48-72 hours, but not improving in 5-7 days.  If severe Shortness of breath .. Go to ER.

## 2012-07-06 NOTE — Progress Notes (Signed)
  Subjective:    Patient ID: [name], male    DOB: 1931-02-05, 79 y.o.   MRN: [id number]  Cough This is a new problem. The current episode started 1 to 4 weeks ago (3 weeks). The problem has been gradually worsening. The problem occurs constantly. The cough is productive of sputum. Associated symptoms include nasal congestion and rhinorrhea. Pertinent negatives include no chills, ear congestion, fever, postnasal drip, sore throat or wheezing. Associated symptoms comments: Lost voice. Risk factors for lung disease include smoking/tobacco exposure (Former smoker). Treatments tried: mucinex and sudafed. The treatment provided mild relief. There is no history of asthma, bronchiectasis, COPD or environmental allergies. Hx of lung cancer, s/p lobectomy, upper left      Review of Systems  Constitutional: Negative for fever and chills.  HENT: Positive for rhinorrhea. Negative for sore throat and postnasal drip.   Respiratory: Negative for wheezing.   Allergic/Immunologic: Negative for environmental allergies.       Objective:   Physical Exam  Constitutional: Vital signs are normal. He appears well-developed and well-nourished.  Non-toxic appearance. He does not appear ill. No distress.  HENT:  Head: Normocephalic and atraumatic.  Right Ear: Hearing, tympanic membrane, external ear and ear canal normal. No tenderness. No foreign bodies. Tympanic membrane is not retracted and not bulging.  Left Ear: Hearing, tympanic membrane, external ear and ear canal normal. No tenderness. No foreign bodies. Tympanic membrane is not retracted and not bulging.  Nose: Nose normal. No mucosal edema or rhinorrhea. Right sinus exhibits no maxillary sinus tenderness and no frontal sinus tenderness. Left sinus exhibits no maxillary sinus tenderness and no frontal sinus tenderness.  Mouth/Throat: Uvula is midline, oropharynx is clear and moist and mucous membranes are normal. Normal dentition. No dental caries. No  oropharyngeal exudate or tonsillar abscesses.  Eyes: Conjunctivae, EOM and lids are normal. Pupils are equal, round, and reactive to light. No foreign bodies found.  Neck: Trachea normal, normal range of motion and phonation normal. Neck supple. Carotid bruit is not present. No mass and no thyromegaly present.  Cardiovascular: Normal rate, regular rhythm, S1 normal, S2 normal, normal heart sounds, intact distal pulses and normal pulses.  Exam reveals no gallop.   No murmur heard. Pulmonary/Chest: Effort normal and breath sounds normal. No respiratory distress. He has no wheezes. He has no rhonchi. He has no rales.  Abdominal: Soft. Normal appearance and bowel sounds are normal. There is no hepatosplenomegaly. There is no tenderness. There is no rebound, no guarding and no CVA tenderness. No hernia.  Neurological: He is alert. He has normal reflexes.  Skin: Skin is warm, dry and intact. No rash noted.  Psychiatric: He has a normal mood and affect. His speech is normal and behavior is normal. Judgment normal.          Assessment & Plan:

## 2012-07-06 NOTE — Assessment & Plan Note (Signed)
Treat with antibiotics given not improving in high risk pt after 3 weeks. Mucinex Dm for cough.

## 2012-07-20 ENCOUNTER — Other Ambulatory Visit (HOSPITAL_BASED_OUTPATIENT_CLINIC_OR_DEPARTMENT_OTHER): Payer: Medicare Other | Admitting: Lab

## 2012-07-20 ENCOUNTER — Ambulatory Visit (HOSPITAL_COMMUNITY)
Admission: RE | Admit: 2012-07-20 | Discharge: 2012-07-20 | Disposition: A | Discharge status: Home / Self Care | Payer: Medicare Other | Source: Ambulatory Visit | Attending: Oncology | Admitting: Oncology

## 2012-07-20 DIAGNOSIS — C3410 Malignant neoplasm of upper lobe, unspecified bronchus or lung: Secondary | ICD-10-CM

## 2012-07-20 DIAGNOSIS — C3430 Malignant neoplasm of lower lobe, unspecified bronchus or lung: Secondary | ICD-10-CM

## 2012-07-20 DIAGNOSIS — C4490 Unspecified malignant neoplasm of skin, unspecified: Secondary | ICD-10-CM

## 2012-07-20 DIAGNOSIS — Z860100 Personal history of colon polyps, unspecified: Secondary | ICD-10-CM | POA: Insufficient documentation

## 2012-07-20 DIAGNOSIS — E119 Type 2 diabetes mellitus without complications: Secondary | ICD-10-CM | POA: Insufficient documentation

## 2012-07-20 DIAGNOSIS — C61 Malignant neoplasm of prostate: Secondary | ICD-10-CM

## 2012-07-20 DIAGNOSIS — Z85828 Personal history of other malignant neoplasm of skin: Secondary | ICD-10-CM | POA: Insufficient documentation

## 2012-07-20 DIAGNOSIS — Z8546 Personal history of malignant neoplasm of prostate: Secondary | ICD-10-CM | POA: Insufficient documentation

## 2012-07-20 DIAGNOSIS — Z85118 Personal history of other malignant neoplasm of bronchus and lung: Secondary | ICD-10-CM | POA: Insufficient documentation

## 2012-07-20 LAB — CBC WITH DIFFERENTIAL/PLATELET
BASO%: 0.2 % (ref 0.0–2.0)
Basophils Absolute: 0 10*3/uL (ref 0.0–0.1)
EOS%: 2.1 % (ref 0.0–7.0)
Eosinophils Absolute: 0.2 10*3/uL (ref 0.0–0.5)
HCT: 38.5 % (ref 38.4–49.9)
HGB: 13.1 g/dL (ref 13.0–17.1)
LYMPH%: 14.3 % (ref 14.0–49.0)
MCH: 31.7 pg (ref 27.2–33.4)
MCHC: 34 g/dL (ref 32.0–36.0)
MCV: 93.4 fL (ref 79.3–98.0)
MONO#: 0.6 10*3/uL (ref 0.1–0.9)
MONO%: 8.3 % (ref 0.0–14.0)
NEUT#: 5.7 10*3/uL (ref 1.5–6.5)
NEUT%: 75.1 % — ABNORMAL HIGH (ref 39.0–75.0)
Platelets: 168 10*3/uL (ref 140–400)
RBC: 4.12 10*6/uL — ABNORMAL LOW (ref 4.20–5.82)
RDW: 12.4 % (ref 11.0–14.6)
WBC: 7.7 10*3/uL (ref 4.0–10.3)
lymph#: 1.1 10*3/uL (ref 0.9–3.3)

## 2012-07-20 LAB — COMPREHENSIVE METABOLIC PANEL (CC13)
ALT: 16 U/L (ref 0–55)
AST: 16 U/L (ref 5–34)
Albumin: 3.4 g/dL — ABNORMAL LOW (ref 3.5–5.0)
Alkaline Phosphatase: 96 U/L (ref 40–150)
BUN: 15.8 mg/dL (ref 7.0–26.0)
CO2: 28 mEq/L (ref 22–29)
Calcium: 9 mg/dL (ref 8.4–10.4)
Chloride: 103 mEq/L (ref 98–107)
Creatinine: 1.2 mg/dL (ref 0.7–1.3)
Glucose: 215 mg/dl — ABNORMAL HIGH (ref 70–99)
Potassium: 4.4 mEq/L (ref 3.5–5.1)
Sodium: 140 mEq/L (ref 136–145)
Total Bilirubin: 0.32 mg/dL (ref 0.20–1.20)
Total Protein: 6.6 g/dL (ref 6.4–8.3)

## 2012-07-20 LAB — PSA: PSA: 0.01 ng/mL (ref <=4.00)

## 2012-07-23 ENCOUNTER — Other Ambulatory Visit (HOSPITAL_COMMUNITY): Payer: Medicare Other

## 2012-07-24 ENCOUNTER — Ambulatory Visit (HOSPITAL_BASED_OUTPATIENT_CLINIC_OR_DEPARTMENT_OTHER): Payer: Medicare Other | Admitting: Oncology

## 2012-07-24 ENCOUNTER — Telehealth: Payer: Self-pay | Admitting: Oncology

## 2012-07-24 VITALS — BP 152/67 | HR 67 | Temp 97.9°F | Resp 20 | Ht 71.0 in | Wt 190.6 lb

## 2012-07-24 DIAGNOSIS — C3410 Malignant neoplasm of upper lobe, unspecified bronchus or lung: Secondary | ICD-10-CM

## 2012-07-24 DIAGNOSIS — R7309 Other abnormal glucose: Secondary | ICD-10-CM

## 2012-07-24 DIAGNOSIS — Z8546 Personal history of malignant neoplasm of prostate: Secondary | ICD-10-CM

## 2012-07-24 DIAGNOSIS — C4490 Unspecified malignant neoplasm of skin, unspecified: Secondary | ICD-10-CM

## 2012-07-24 DIAGNOSIS — Z85828 Personal history of other malignant neoplasm of skin: Secondary | ICD-10-CM

## 2012-07-24 DIAGNOSIS — C61 Malignant neoplasm of prostate: Secondary | ICD-10-CM

## 2012-07-24 NOTE — Telephone Encounter (Signed)
gv and printed appt sched and avs for April 2015.Marland KitchenMarland Kitchenpt aware to go to radiology at nxt appt.Marland KitchenMarland Kitchen

## 2012-07-24 NOTE — Progress Notes (Signed)
Hematology and Oncology Follow Up Visit  [name] [id number] 06-04-1930 79 y.o. 07/24/2012 12:47 PM   Principle Diagnosis: Encounter Diagnoses  Name Primary?  . Prostate ca Yes  . Lung cancer, lower lobe, left   . CARCINOMA, SKIN, SQUAMOUS CELL      Interim History:   Mr. Jagiello will be 62 this July. He was diagnosed with a stage I adenocarcinoma of the left lung treated with surgery in October 2001. He subsequently was diagnosed with a Gleason 7 prostate cancer and underwent radical prostatectomy in January 2003. He has had multiple non-melanoma skin cancers removed. Considering his cancer history, he is doing amazingly well with no evidence for any new disease at this time. I am seeing him on an annual basis. Chest x-ray done last week in anticipation of today's visit on April 18 which I personally reviewed shows postsurgical change but no evidence for new cancer. PSA 0.01 done the same day. He has has a history of benign colon polyps. Most recent colonoscopy done by Dr. Carman Ching 06/03/2011 reported to the patient as being unremarkable.  He has had no interim medical problems. He gets dyspnea on exertion but not at rest. No cough. He has chronic nocturia x3. No hematuria.  Medications: reviewed  Allergies:  Allergies  Allergen Reactions  . Sulfa Antibiotics Nausea Only    Review of Systems: Constitutional:  No constitutional symptoms  Respiratory: See above Cardiovascular:  No chest pain or palpitations Gastrointestinal: No change in bowel habit Genito-Urinary: See above Musculoskeletal: No bone pain Neurologic: No headache or change in vision Skin: No rash Remaining ROS negative.  Physical Exam: Blood pressure 152/67, pulse 67, temperature 97.9 F (36.6 C), temperature source Oral, resp. rate 20, height 5\' 11"  (1.803 m), weight 190 lb 9.6 oz (86.456 kg). Wt Readings from Last 3 Encounters:  07/24/12 190 lb 9.6 oz (86.456 kg)  07/06/12 191 lb 12.8 oz (87  kg)  04/17/12 190 lb (86.183 kg)     General appearance: Well-nourished Caucasian man HENNT: Pharynx no erythema or exudate Lymph nodes: No adenopathy Breasts: Lungs: Clear to auscultation resonant to percussion Heart: Regular rhythm no murmur Abdomen: Soft, nontender, no mass, no organomegaly Extremities: 1+ pedal edema Vascular: No cyanosis Neurologic: Chronic mild memory deficit but alert, oriented, and cooperative, motor strength 5 over 5, reflexes absent but symmetric Skin: No new obvious skin lesions, complete exam not done  Lab Results: Lab Results  Component Value Date   WBC 7.7 07/20/2012   HGB 13.1 07/20/2012   HCT 38.5 07/20/2012   MCV 93.4 07/20/2012   PLT 168 07/20/2012     Chemistry      Component Value Date/Time   NA 140 07/20/2012 1046   NA 138 07/20/2011 1125   K 4.4 07/20/2012 1046   K 4.6 07/20/2011 1125   CL 103 07/20/2012 1046   CL 103 07/20/2011 1125   CO2 28 07/20/2012 1046   CO2 29 07/20/2011 1125   BUN 15.8 07/20/2012 1046   BUN 12 07/20/2011 1125   CREATININE 1.2 07/20/2012 1046   CREATININE 0.90 07/20/2011 1125      Component Value Date/Time   CALCIUM 9.0 07/20/2012 1046   CALCIUM 9.0 07/20/2011 1125   ALKPHOS 96 07/20/2012 1046   ALKPHOS 75 07/20/2011 1125   AST 16 07/20/2012 1046   AST 14 07/20/2011 1125   ALT 16 07/20/2012 1046   ALT 12 07/20/2011 1125   BILITOT 0.32 07/20/2012 1046   BILITOT 0.4 07/20/2011 1125  Radiological Studies: Dg Chest 2 View  07/20/2012  *RADIOLOGY REPORT*  Clinical Data: History of lung cancer and prostate cancer.  CHEST - 2 VIEW  Comparison: 07/20/2011.  Findings: Trachea is midline.  Heart size stable.  There are postoperative changes and volume loss in the left hemithorax, unchanged.  Lungs are otherwise clear.  No pleural fluid.  IMPRESSION: Postoperative changes and volume loss in the left hemithorax.  No acute findings.   Original Report Authenticated By: Leanna Battles, M.D.     Impression and Plan:  1. Stage I  (T1 N0) adenocarcinoma left lung, resected 01/03/00. No evidence for recurrent disease.  2. Gleason 7 prostate cancer, status post radical prostatectomy 04/11/01. No evidence for recurrence. 3. Status post excision multiple non-melanoma skin cancers. No obvious new lesions. 4. Persistent elevation of random glucose. Now on Glucophage 500 mg 2 tablets twice daily which was causing diarrhea so he cut the dose back on his own. daily.  5. History of benign colon polyps. 6. Early dementia on Aricept and Zoloft 7. Hyperlipidemia on Lipitor    CC:. Dr. Kerby Nora   Levert Feinstein, MD 4/22/201412:47 PM

## 2012-07-26 ENCOUNTER — Other Ambulatory Visit: Payer: Self-pay | Admitting: Family Medicine

## 2012-07-30 ENCOUNTER — Telehealth: Payer: Self-pay | Admitting: Dietician

## 2012-08-03 ENCOUNTER — Telehealth: Payer: Self-pay | Admitting: Family Medicine

## 2012-08-03 ENCOUNTER — Other Ambulatory Visit (INDEPENDENT_AMBULATORY_CARE_PROVIDER_SITE_OTHER): Payer: Medicare Other

## 2012-08-03 ENCOUNTER — Other Ambulatory Visit: Payer: Medicare Other

## 2012-08-03 DIAGNOSIS — E7800 Pure hypercholesterolemia, unspecified: Secondary | ICD-10-CM

## 2012-08-03 DIAGNOSIS — E119 Type 2 diabetes mellitus without complications: Secondary | ICD-10-CM

## 2012-08-03 DIAGNOSIS — I10 Essential (primary) hypertension: Secondary | ICD-10-CM

## 2012-08-03 LAB — COMPREHENSIVE METABOLIC PANEL
ALT: 11 U/L (ref 0–53)
AST: 12 U/L (ref 0–37)
Albumin: 4.1 g/dL (ref 3.5–5.2)
Alkaline Phosphatase: 88 U/L (ref 39–117)
BUN: 14 mg/dL (ref 6–23)
CO2: 29 mEq/L (ref 19–32)
Calcium: 9.3 mg/dL (ref 8.4–10.5)
Chloride: 101 mEq/L (ref 96–112)
Creat: 1.13 mg/dL (ref 0.50–1.35)
Glucose, Bld: 176 mg/dL — ABNORMAL HIGH (ref 70–99)
Potassium: 5.4 mEq/L — ABNORMAL HIGH (ref 3.5–5.3)
Sodium: 138 mEq/L (ref 135–145)
Total Bilirubin: 0.6 mg/dL (ref 0.3–1.2)
Total Protein: 6.7 g/dL (ref 6.0–8.3)

## 2012-08-03 LAB — LIPID PANEL
Cholesterol: 113 mg/dL (ref 0–200)
HDL: 31 mg/dL — ABNORMAL LOW (ref >39)
LDL Cholesterol: 52 mg/dL (ref 0–99)
Total CHOL/HDL Ratio: 3.6 Ratio
Triglycerides: 149 mg/dL (ref <150)
VLDL: 30 mg/dL (ref 0–40)

## 2012-08-03 LAB — HEMOGLOBIN A1C
Hgb A1c MFr Bld: 7.4 % — ABNORMAL HIGH (ref <5.7)
Mean Plasma Glucose: 166 mg/dL — ABNORMAL HIGH (ref <117)

## 2012-08-03 NOTE — Telephone Encounter (Signed)
Entered labs

## 2012-08-09 ENCOUNTER — Ambulatory Visit (INDEPENDENT_AMBULATORY_CARE_PROVIDER_SITE_OTHER): Payer: Medicare Other | Admitting: Family Medicine

## 2012-08-09 ENCOUNTER — Encounter: Payer: Self-pay | Admitting: Family Medicine

## 2012-08-09 VITALS — BP 120/72 | HR 81 | Temp 98.1°F | Ht 71.0 in | Wt 188.8 lb

## 2012-08-09 DIAGNOSIS — I10 Essential (primary) hypertension: Secondary | ICD-10-CM

## 2012-08-09 DIAGNOSIS — E119 Type 2 diabetes mellitus without complications: Secondary | ICD-10-CM

## 2012-08-09 DIAGNOSIS — E7800 Pure hypercholesterolemia, unspecified: Secondary | ICD-10-CM

## 2012-08-09 DIAGNOSIS — J209 Acute bronchitis, unspecified: Secondary | ICD-10-CM | POA: Insufficient documentation

## 2012-08-09 LAB — HM DIABETES FOOT EXAM

## 2012-08-09 MED ORDER — SITAGLIPTIN PHOSPHATE 100 MG PO TABS: 100.0000 mg | ORAL_TABLET | Freq: Every day | ORAL | Status: DC

## 2012-08-09 MED ORDER — AZITHROMYCIN 250 MG PO TABS: ORAL_TABLET | ORAL | Status: DC

## 2012-08-09 NOTE — Progress Notes (Signed)
  Subjective:    Patient ID: [name], male    DOB: [birth year], 79 y.o.   MRN: [id number]  HPI  79 year old male with dementia presents overdue for follow up to disucss DM, cholesterol etc.  In last 2 weeks... Has been having terrible cold. Worsening in last few days. Dark phlegm productive cough. No SOB. No ST. No face pain, no ear pain. No fever, no chills.  Using sudafed for symptoms.  Diabetes: Borderline control on metformin... But cannot take 2 tabs twice daily given diarrhea. Only using 1 a day in last month. Lab Results  Component Value Date   HGBA1C 7.4* 08/03/2012  Using medications without difficulties: Hypoglycemic episodes:None Hyperglycemic episodes:Yes Feet problems:None Blood Sugars averaging:233 eye exam within last year:Yes  Hypertension:  At goal on current meds. Using medication without problems or lightheadedness: None Chest pain with exertion:None Edema:None Short of breath:None Average home BPs:120/70s Other issues:  Elevated Cholesterol: LDL at goal on atorvastatin. Lab Results  Component Value Date   CHOL 113 08/03/2012   HDL 31* 08/03/2012   LDLCALC 52 08/03/2012   LDLDIRECT 130.0 08/06/2010   TRIG 149 08/03/2012   CHOLHDL 3.6 08/03/2012    Using medications without problems:None Muscle aches: None Diet compliance: Poor Exercise: occ walking Other complaints:   Review of Systems     Objective:   Physical Exam        Assessment & Plan:

## 2012-08-09 NOTE — Assessment & Plan Note (Signed)
Poor control, FBS worse than A1C.  Cannot tolerate any higher dose of metformin.. Stay at 1 tab po daily.  Add [place]. Work on lifestyle. Follow up in 3 months for re-eval.

## 2012-08-09 NOTE — Assessment & Plan Note (Signed)
LDL at goal on lipitor.

## 2012-08-09 NOTE — Assessment & Plan Note (Signed)
Well controlled. Continue current medication.  

## 2012-08-09 NOTE — Assessment & Plan Note (Signed)
Treat with mucinex DM and Zpack. Call if not improving as expected. Pt has history of lung cancer remotely.

## 2012-08-09 NOTE — Patient Instructions (Addendum)
Mucinex DM for cough and congestion. Do not use sudafed or other decongestants.  Okay to stay at lower dose metformin once daily 500 mg ER.  Start januvia 100 mg daily. Start Zpack for acute bronchitis.  Call if not improving or if new SOB.  Follow up in 3 months with labs prior.

## 2012-08-14 ENCOUNTER — Telehealth: Payer: Self-pay

## 2012-08-14 MED ORDER — MOXIFLOXACIN HCL 400 MG PO TABS: 400.0000 mg | ORAL_TABLET | Freq: Every day | ORAL | Status: DC

## 2012-08-14 NOTE — Telephone Encounter (Signed)
Patient's wife advised

## 2012-08-14 NOTE — Telephone Encounter (Signed)
Hold multivitamin while on avelox. Follow up if not improving as expected with second antibiotic.

## 2012-08-14 NOTE — Telephone Encounter (Signed)
pts wife said pt seen on 08/09/12; pt still has productive cough with dark phlegm, chest congestion no better. Slight wheezing, pt not moving around much. No fever, S/T, earache,or chills. Mrs Nicholl said pt might be slightly better but thinks pt needs stronger antibiotic to [facility].Please advise.

## 2012-09-13 ENCOUNTER — Other Ambulatory Visit: Payer: Self-pay

## 2012-09-13 ENCOUNTER — Ambulatory Visit (HOSPITAL_COMMUNITY): Admission: EM | Admit: 2012-09-13 | Payer: Self-pay | Admitting: Cardiovascular Disease

## 2012-09-13 ENCOUNTER — Ambulatory Visit (INDEPENDENT_AMBULATORY_CARE_PROVIDER_SITE_OTHER): Payer: Medicare Other | Admitting: Family Medicine

## 2012-09-13 ENCOUNTER — Inpatient Hospital Stay (HOSPITAL_COMMUNITY)
Admission: AD | Admit: 2012-09-13 | Discharge: 2012-09-20 | DRG: 238 | Disposition: A | Discharge status: Home / Self Care | Payer: Medicare Other | Source: Ambulatory Visit | Attending: Cardiovascular Disease | Admitting: Cardiovascular Disease

## 2012-09-13 ENCOUNTER — Encounter (HOSPITAL_COMMUNITY): Payer: Self-pay | Admitting: Physician Assistant

## 2012-09-13 ENCOUNTER — Telehealth: Payer: Self-pay | Admitting: Family Medicine

## 2012-09-13 ENCOUNTER — Encounter (HOSPITAL_COMMUNITY)
Admission: AD | Disposition: A | Discharge status: Home / Self Care | Payer: Self-pay | Source: Ambulatory Visit | Attending: Cardiovascular Disease

## 2012-09-13 ENCOUNTER — Encounter (HOSPITAL_COMMUNITY): Payer: Self-pay | Admitting: *Deleted

## 2012-09-13 ENCOUNTER — Encounter: Payer: Self-pay | Admitting: Family Medicine

## 2012-09-13 VITALS — BP 120/70 | HR 117 | Temp 99.2°F | Wt 184.0 lb

## 2012-09-13 DIAGNOSIS — I309 Acute pericarditis, unspecified: Principal | ICD-10-CM | POA: Diagnosis present

## 2012-09-13 DIAGNOSIS — Z6827 Body mass index (BMI) 27.0-27.9, adult: Secondary | ICD-10-CM

## 2012-09-13 DIAGNOSIS — F411 Generalized anxiety disorder: Secondary | ICD-10-CM | POA: Diagnosis present

## 2012-09-13 DIAGNOSIS — I2119 ST elevation (STEMI) myocardial infarction involving other coronary artery of inferior wall: Secondary | ICD-10-CM

## 2012-09-13 DIAGNOSIS — I10 Essential (primary) hypertension: Secondary | ICD-10-CM | POA: Diagnosis present

## 2012-09-13 DIAGNOSIS — Z85118 Personal history of other malignant neoplasm of bronchus and lung: Secondary | ICD-10-CM

## 2012-09-13 DIAGNOSIS — Z6826 Body mass index (BMI) 26.0-26.9, adult: Secondary | ICD-10-CM

## 2012-09-13 DIAGNOSIS — Z85828 Personal history of other malignant neoplasm of skin: Secondary | ICD-10-CM

## 2012-09-13 DIAGNOSIS — E7800 Pure hypercholesterolemia, unspecified: Secondary | ICD-10-CM

## 2012-09-13 DIAGNOSIS — Z8546 Personal history of malignant neoplasm of prostate: Secondary | ICD-10-CM

## 2012-09-13 DIAGNOSIS — R079 Chest pain, unspecified: Secondary | ICD-10-CM

## 2012-09-13 DIAGNOSIS — I313 Pericardial effusion (noninflammatory): Secondary | ICD-10-CM

## 2012-09-13 DIAGNOSIS — F0280 Dementia in other diseases classified elsewhere without behavioral disturbance: Secondary | ICD-10-CM | POA: Diagnosis present

## 2012-09-13 DIAGNOSIS — E119 Type 2 diabetes mellitus without complications: Secondary | ICD-10-CM

## 2012-09-13 DIAGNOSIS — C61 Malignant neoplasm of prostate: Secondary | ICD-10-CM

## 2012-09-13 DIAGNOSIS — C3430 Malignant neoplasm of lower lobe, unspecified bronchus or lung: Secondary | ICD-10-CM

## 2012-09-13 DIAGNOSIS — E1159 Type 2 diabetes mellitus with other circulatory complications: Secondary | ICD-10-CM | POA: Diagnosis present

## 2012-09-13 DIAGNOSIS — Z87891 Personal history of nicotine dependence: Secondary | ICD-10-CM

## 2012-09-13 DIAGNOSIS — R634 Abnormal weight loss: Secondary | ICD-10-CM | POA: Diagnosis present

## 2012-09-13 DIAGNOSIS — I2510 Atherosclerotic heart disease of native coronary artery without angina pectoris: Secondary | ICD-10-CM | POA: Diagnosis present

## 2012-09-13 DIAGNOSIS — E785 Hyperlipidemia, unspecified: Secondary | ICD-10-CM | POA: Diagnosis present

## 2012-09-13 DIAGNOSIS — G309 Alzheimer's disease, unspecified: Secondary | ICD-10-CM | POA: Diagnosis present

## 2012-09-13 DIAGNOSIS — I314 Cardiac tamponade: Secondary | ICD-10-CM | POA: Diagnosis not present

## 2012-09-13 HISTORY — DX: Atherosclerotic heart disease of native coronary artery without angina pectoris: I25.10

## 2012-09-13 HISTORY — PX: LEFT HEART CATHETERIZATION WITH CORONARY ANGIOGRAM: SHX5451

## 2012-09-13 HISTORY — DX: Disease of pericardium, unspecified: I31.9

## 2012-09-13 LAB — CBC
HCT: 35.5 % — ABNORMAL LOW (ref 39.0–52.0)
HCT: 37.1 % — ABNORMAL LOW (ref 39.0–52.0)
Hemoglobin: 12 g/dL — ABNORMAL LOW (ref 13.0–17.0)
Hemoglobin: 12.3 g/dL — ABNORMAL LOW (ref 13.0–17.0)
MCH: 31.6 pg (ref 26.0–34.0)
MCH: 31.4 pg (ref 26.0–34.0)
MCHC: 33.8 g/dL (ref 30.0–36.0)
MCHC: 33.2 g/dL (ref 30.0–36.0)
MCV: 93.4 fL (ref 78.0–100.0)
MCV: 94.6 fL (ref 78.0–100.0)
Platelets: 135 10*3/uL — ABNORMAL LOW (ref 150–400)
Platelets: 132 10*3/uL — ABNORMAL LOW (ref 150–400)
RBC: 3.8 MIL/uL — ABNORMAL LOW (ref 4.22–5.81)
RBC: 3.92 MIL/uL — ABNORMAL LOW (ref 4.22–5.81)
RDW: 13.2 % (ref 11.5–15.5)
RDW: 13.4 % (ref 11.5–15.5)
WBC: 12.7 10*3/uL — ABNORMAL HIGH (ref 4.0–10.5)
WBC: 11.8 10*3/uL — ABNORMAL HIGH (ref 4.0–10.5)

## 2012-09-13 LAB — COMPREHENSIVE METABOLIC PANEL
ALT: 8 U/L (ref 0–53)
AST: 8 U/L (ref 0–37)
Albumin: 3 g/dL — ABNORMAL LOW (ref 3.5–5.2)
Alkaline Phosphatase: 71 U/L (ref 39–117)
BUN: 16 mg/dL (ref 6–23)
CO2: 23 mEq/L (ref 19–32)
Calcium: 8 mg/dL — ABNORMAL LOW (ref 8.4–10.5)
Chloride: 100 mEq/L (ref 96–112)
Creatinine, Ser: 1.11 mg/dL (ref 0.50–1.35)
GFR calc Af Amer: 71 mL/min — ABNORMAL LOW (ref >90)
GFR calc non Af Amer: 61 mL/min — ABNORMAL LOW (ref >90)
Glucose, Bld: 201 mg/dL — ABNORMAL HIGH (ref 70–99)
Potassium: 4 mEq/L (ref 3.5–5.1)
Sodium: 132 mEq/L — ABNORMAL LOW (ref 135–145)
Total Bilirubin: 0.5 mg/dL (ref 0.3–1.2)
Total Protein: 6 g/dL (ref 6.0–8.3)

## 2012-09-13 LAB — CK TOTAL AND CKMB (NOT AT ARMC)
CK, MB: 1 ng/mL (ref 0.3–4.0)
CK, MB: 1.1 ng/mL (ref 0.3–4.0)
CK, MB: 1.2 ng/mL (ref 0.3–4.0)
Relative Index: INVALID (ref 0.0–2.5)
Relative Index: INVALID (ref 0.0–2.5)
Relative Index: INVALID (ref 0.0–2.5)
Total CK: 32 U/L (ref 7–232)
Total CK: 33 U/L (ref 7–232)
Total CK: 31 U/L (ref 7–232)

## 2012-09-13 LAB — LIPID PANEL
Cholesterol: 91 mg/dL (ref 0–200)
HDL: 37 mg/dL — ABNORMAL LOW (ref >39)
LDL Cholesterol: 39 mg/dL (ref 0–99)
Total CHOL/HDL Ratio: 2.5 RATIO
Triglycerides: 77 mg/dL (ref <150)
VLDL: 15 mg/dL (ref 0–40)

## 2012-09-13 LAB — PROTIME-INR
INR: 1.59 — ABNORMAL HIGH (ref 0.00–1.49)
Prothrombin Time: 18.5 seconds — ABNORMAL HIGH (ref 11.6–15.2)

## 2012-09-13 LAB — CREATININE, SERUM
Creatinine, Ser: 1.14 mg/dL (ref 0.50–1.35)
GFR calc Af Amer: 69 mL/min — ABNORMAL LOW (ref >90)
GFR calc non Af Amer: 59 mL/min — ABNORMAL LOW (ref >90)

## 2012-09-13 LAB — HEMOGLOBIN A1C
Hgb A1c MFr Bld: 7.6 % — ABNORMAL HIGH (ref <5.7)
Mean Plasma Glucose: 171 mg/dL — ABNORMAL HIGH (ref <117)

## 2012-09-13 LAB — GLUCOSE, CAPILLARY: Glucose-Capillary: 171 mg/dL — ABNORMAL HIGH (ref 70–99)

## 2012-09-13 LAB — TROPONIN I: Troponin I: <0.3 ng/mL (ref <0.30)

## 2012-09-13 LAB — APTT: aPTT: >200 seconds (ref 24–37)

## 2012-09-13 LAB — MRSA PCR SCREENING: MRSA by PCR: NEGATIVE

## 2012-09-13 SURGERY — LEFT HEART CATHETERIZATION WITH CORONARY ANGIOGRAM
Anesthesia: LOCAL

## 2012-09-13 MED ORDER — ONDANSETRON HCL 4 MG/2ML IJ SOLN: 4.0000 mg | Freq: Four times a day (QID) | INTRAMUSCULAR | Status: DC | PRN

## 2012-09-13 MED ORDER — NITROGLYCERIN 0.2 MG/ML ON CALL CATH LAB
INTRAVENOUS | Status: AC
  Filled 2012-09-13: qty 1

## 2012-09-13 MED ORDER — HEPARIN SODIUM (PORCINE) 1000 UNIT/ML IJ SOLN
INTRAMUSCULAR | Status: AC
  Filled 2012-09-13: qty 1

## 2012-09-13 MED ORDER — VERAPAMIL HCL 2.5 MG/ML IV SOLN
INTRAVENOUS | Status: AC
  Filled 2012-09-13: qty 2

## 2012-09-13 MED ORDER — ASPIRIN EC 325 MG PO TBEC
650.0000 mg | DELAYED_RELEASE_TABLET | Freq: Two times a day (BID) | ORAL | Status: DC
  Administered 2012-09-13 – 2012-09-14 (×3): 650 mg via ORAL
  Filled 2012-09-13 (×4): qty 2

## 2012-09-13 MED ORDER — ENOXAPARIN SODIUM 40 MG/0.4ML ~~LOC~~ SOLN
40.0000 mg | SUBCUTANEOUS | Status: DC
  Administered 2012-09-14 – 2012-09-15 (×2): 40 mg via SUBCUTANEOUS
  Filled 2012-09-13 (×2): qty 0.4

## 2012-09-13 MED ORDER — SODIUM CHLORIDE 0.9 % IV SOLN
INTRAVENOUS | Status: AC
  Administered 2012-09-13: 16:00:00 via INTRAVENOUS

## 2012-09-13 MED ORDER — COLCHICINE 0.6 MG PO TABS
0.6000 mg | ORAL_TABLET | Freq: Two times a day (BID) | ORAL | Status: DC
  Administered 2012-09-13 – 2012-09-15 (×4): 0.6 mg via ORAL
  Filled 2012-09-13 (×5): qty 1

## 2012-09-13 MED ORDER — HEPARIN (PORCINE) IN NACL 2-0.9 UNIT/ML-% IJ SOLN
INTRAMUSCULAR | Status: AC
  Filled 2012-09-13: qty 1000

## 2012-09-13 MED ORDER — MIDAZOLAM HCL 2 MG/2ML IJ SOLN
INTRAMUSCULAR | Status: AC
  Filled 2012-09-13: qty 2

## 2012-09-13 MED ORDER — FENTANYL CITRATE 0.05 MG/ML IJ SOLN
INTRAMUSCULAR | Status: AC
  Filled 2012-09-13: qty 2

## 2012-09-13 MED ORDER — PANTOPRAZOLE SODIUM 40 MG PO TBEC
40.0000 mg | DELAYED_RELEASE_TABLET | Freq: Every day | ORAL | Status: DC
  Administered 2012-09-13 – 2012-09-20 (×8): 40 mg via ORAL
  Filled 2012-09-13 (×8): qty 1

## 2012-09-13 MED ORDER — OXYCODONE-ACETAMINOPHEN 5-325 MG PO TABS
1.0000 | ORAL_TABLET | ORAL | Status: DC | PRN
  Administered 2012-09-13: 1 via ORAL
  Administered 2012-09-13: 2 via ORAL
  Administered 2012-09-14 (×3): 1 via ORAL
  Administered 2012-09-15 – 2012-09-16 (×3): 2 via ORAL
  Filled 2012-09-13: qty 2
  Filled 2012-09-13: qty 1
  Filled 2012-09-13 (×5): qty 2
  Filled 2012-09-13: qty 1

## 2012-09-13 MED ORDER — ACETAMINOPHEN 325 MG PO TABS: 650.0000 mg | ORAL_TABLET | ORAL | Status: DC | PRN

## 2012-09-13 MED ORDER — ATORVASTATIN CALCIUM 40 MG PO TABS
40.0000 mg | ORAL_TABLET | Freq: Every day | ORAL | Status: DC
  Administered 2012-09-13 – 2012-09-15 (×3): 40 mg via ORAL
  Filled 2012-09-13 (×4): qty 1

## 2012-09-13 MED ORDER — DONEPEZIL HCL 10 MG PO TABS
10.0000 mg | ORAL_TABLET | Freq: Every day | ORAL | Status: DC
  Administered 2012-09-13 – 2012-09-19 (×7): 10 mg via ORAL
  Filled 2012-09-13 (×10): qty 1

## 2012-09-13 MED ORDER — LIDOCAINE HCL (PF) 1 % IJ SOLN
INTRAMUSCULAR | Status: AC
  Filled 2012-09-13: qty 30

## 2012-09-13 MED ORDER — SERTRALINE HCL 50 MG PO TABS
50.0000 mg | ORAL_TABLET | Freq: Every day | ORAL | Status: DC
  Administered 2012-09-13 – 2012-09-14 (×2): 50 mg via ORAL
  Filled 2012-09-13 (×3): qty 1

## 2012-09-13 MED ORDER — LINAGLIPTIN 5 MG PO TABS
5.0000 mg | ORAL_TABLET | Freq: Every day | ORAL | Status: DC
  Administered 2012-09-13 – 2012-09-15 (×3): 5 mg via ORAL
  Filled 2012-09-13 (×4): qty 1

## 2012-09-13 NOTE — Telephone Encounter (Signed)
Patient Information:  Caller Name: [name]  Phone: [phone]  Patient: Ethan, Castillo  Gender: Male  DOB: Nov 26, 1930  Age: 79 Years  PCP: Kerby Nora (Family Practice)  Office Follow Up:  Does the office need to follow up with this patient?: No (Office open, pt declined 911/ED)  Instructions For The Office: N/A   Symptoms  Reason For Call & Symptoms: chest pain.  Caller states that patient has been moving limbs a couple of days ago  Reviewed Health History In EMR: Yes  Reviewed Medications In EMR: Yes  Reviewed Allergies In EMR: Yes  Reviewed Surgeries / Procedures: Yes  Date of Onset of Symptoms: 09/12/2012  Guideline(s) Used:  Chest Pain  Disposition Per Guideline:   Call EMS 911 Now  Reason For Disposition Reached:   Chest pain lasting longer than 5 minutes and ANY of the following:  Over 27 years old Over 20 years old and at least one cardiac risk factor (i.e., high blood pressure, diabetes, high cholesterol, obesity, smoker or strong family history of heart disease) Pain is crushing, pressure-like, or heavy  Took nitroglycerin and chest pain was not relieved History of heart disease (i.e., angina, heart attack, bypass surgery, angioplasty, CHF)  Advice Given:  N/A  Patient Refused Recommendation:  Patient Refused Care Advice  pt refused 911, pt requesting to come to the office Appt scheduled for 1215 today (09/13/12) with Dr Dallas Schimke.  Caller states she is 2 miles from the office

## 2012-09-13 NOTE — H&P (Signed)
History and Physical   Patient ID: [name] MRN: [id number], DOB/AGE: 1930/04/10   Admit date: (Not on file) Date of Consult: 09/13/2012   Primary Physician: Kerby Nora, MD Primary Cardiologist: New   HPI: [name] is a 79 y.o. male with no prior cardiac history, PMHx s/f Alzheimer's disease, type 2 DM, HTN, HLD, h/o tobacco abuse, h/o prostate CA (s/p prostatectomy) and h/o lung CA (s/p LUL lobectomy) who was direct-admitted from his PCP's office today with chest pain and EKG changes concerning for STEMI.   He was in his USOH until 2-3 days ago when he began experiencing sharp substernal chest pain without radiation provoked on inspiration and supine position. He does endorse relief with "sitting up." He does note some shortness of breath ("hard to take a deep breath" ), otherwise no associated symptoms. He had performed yard work just prior and attributed it to a strained muscle. The discomfort persisted and prevented him from sleeping last night. He specifically denies PND, orthopnea, palpitations, lightheadedness or syncope. He denies active bleeding. He denies fevers, chills or cough. He does note that his seasonal allergies have been "worse" recently. His symptoms persisted and he presented to his PCP's office.   There, EKG revealed Q waves V1, V2, ST elevations I, aVL. Rate 114 bpm. Given the patient's history of two prior malignancies and multiple cardiac risk factors, the concern was for ACS vs PE. EMS was called. He was given O2 and ASA 81mg  x 4. On EMS arrival, ST elevations were appreciated in III, aVF as well. Code STEMI was called and he was transported emergently to the [facility] cardiac cath lab.   Problem List: Past Medical History  Diagnosis Date  . Unspecified essential hypertension   . Alzheimer's disease   . Anxiety states   . Malignant neoplasm of prostate   . Cellulitis and abscess of unspecified site   . Personal history of malignant neoplasm of  bronchus and lung   . Squamous cell carcinoma of skin   . Lung cancer, lower lobe 07/25/2011    T1N0 stage I  adenoca left lung resected 01/03/00  . Prostate ca 07/25/2011    Gleason 7  S/P prostatectomy 04/11/01  . Nonmelanoma skin cancer 07/25/2011    Multiple lesions excised face/nose  . History of colonic polyps 07/25/2011  . Type 2 diabetes mellitus     Hgb A1C 7.4% 08/2012    Past Surgical History  Procedure Laterality Date  . Lobectomy  2001    upper left  . Prostatectomy    . Hernia repair    . Tonsillectomy       Allergies:  Allergies  Allergen Reactions  . Sulfa Antibiotics Nausea Only    Home Medications: Prior to Admission medications   Medication Sig Start Date End Date Taking? Authorizing Provider  atorvastatin (LIPITOR) 40 MG tablet TAKE 1 TABLET DAILY 09/24/11   Ethan Michelle Nasuti, MD  donepezil (ARICEPT) 10 MG tablet Take 1 tablet (10 mg total) by mouth at bedtime. 05/08/12   Ethan Michelle Nasuti, MD  Multiple Vitamins-Minerals (EQL PROTECTAVISION PO) Take 1 tablet by mouth daily.      Historical Provider, MD  Omega-3 Fatty Acids (FISH OIL) 1000 MG CAPS Take by mouth daily.      Historical Provider, MD  sertraline (ZOLOFT) 50 MG tablet TAKE 1 TABLET DAILY 05/08/12   Ethan E Ermalene Searing, MD  sitaGLIPtin (JANUVIA) 100 MG tablet Take 1 tablet (100 mg total) by mouth daily.  08/09/12   Ethan Seltzer, MD    Inpatient Medications:   No prescriptions prior to admission    Family History  Problem Relation Age of Onset  . Dementia Mother   . Diabetes Mother   . Cancer Father     colon  . Cancer Brother     lung    Denies familial cardiac history.   History   Social History  . Marital Status: Married    Spouse Name: N/A    Number of Children: N/A  . Years of Education: N/A   Occupational History  . retired    Social History Main Topics  . Smoking status: Former Games developer  . Smokeless tobacco: Former Neurosurgeon     Comment: previously smoked 4 PPD; quit over 30 years  . Alcohol Use:  No  . Drug Use: No  . Sexually Active: Not on file   Other Topics Concern  . Not on file   Social History Narrative   Lives in [place], [place]   Regular exercise--no, mowing grass   Diet: fruit and veggies    Review of Systems: General: negative for chills, fever, night sweats or weight changes.  Cardiovascular: positive for chest pain, shortness of breath, negative for edema, orthopnea, palpitations, paroxysmal nocturnal dyspnea or shortness of breath Dermatological: negative for rash Respiratory: negative for cough or wheezing Urologic: negative for hematuria Abdominal: negative for nausea, vomiting, diarrhea, bright red blood per rectum, melena, or hematemesis Neurologic:  negative for visual changes, syncope, or dizziness All other systems reviewed and are otherwise negative except as noted above.  Physical Exam:  BP 121/70, HR 83, RR 23, O2 96% on 2L va n/c   General: Elderly male in NAD Head: Normocephalic, atraumatic, sclera non-icteric, no xanthomas, nares are without discharge Neck: Negative for carotid bruits. JVD not elevated. Lungs: Clear bilaterally to auscultation without wheezes, rales, or rhonchi. Breathing is unlabored. Heart: RRR with S1 S2. No murmurs, rubs, or gallops appreciated. Abdomen: Soft, non-tender, non-distended with normoactive bowel sounds. No hepatomegaly. No rebound/guarding. No obvious abdominal masses. Msk:  Strength and tone appears normal for age. Extremities: No clubbing, cyanosis or edema.  Distal pedal pulses are 2+ and equal bilaterally. Neuro: Alert and oriented X 3. Moves all extremities spontaneously. Psych:  Responds to questions appropriately with a normal affect.  Labs:  Pending  Radiology/Studies: No results found.  EKG: Sinus tachycardia, ST elevation (1 mm) I, aVL, II, III, aVF, Q waves V1, V2  ASSESSMENT AND PLAN:   79 y.o. male with no prior cardiac history, PMHx s/f Alzheimer's disease, type 2 DM, HTN, HLD, h/o  tobacco abuse, h/o prostate CA (s/p prostatectomy) and h/o lung CA (s/p LUL lobectomy) who was direct-admitted from his PCP's office today with chest pain and EKG changes concerning for STEMI.   1. STEMI/Inferolateral ST elevations- new from prior EKG in 2009. Chest pain description most closely reflects a pericarditis picture; however, given age, HTN, DM2, HLD and tobacco abuse history ACS certainly is a concern. The patient is currently undergoing diagnostic cardiac catheterization for further evaluation. Further recommendations will be based on the interventionalist's findings.   - Start low-dose ASA, add BB, increase statin, NTG SL PRN   2. Type 2 DM- intolerant to Metformin (diarrhea).  - SSI while inpatient  - Consider sulfonylurea at discharge  3. Hypertension- BP 120/70 at PCP's office  - Low-dose BB if BP can tolerate  4. Hyperlipidemia- given DM2 and depending on CAD findings on cath, would warrant  high-potency statin.  - Atorvastatin 80mg  PO daily  5. Alzheimer's disease  - Continue aricept  Will admit to CCU. Check TSH, CBC, CMET, CXR, cycle cardiac biomarkers, heart healthy diet. Consider 2D echo.    Signed, R. Hurman Horn, PA-C 09/13/2012, 2:23 PM

## 2012-09-13 NOTE — H&P (Signed)
Gery Pray, PA-C Physician Assistant Cosign Needed Cardiology H&P Service date: 09/13/2012 1:59 PM  Related encounter: Admission (Canceled) from 09/13/2012 in [facility] CARDIAC CATH LAB      History and Physical      Patient ID: [name] MRN: [id number], DOB/AGE: 1930-10-18    Admit date: (Not on file) Date of Consult: 09/13/2012     Primary Physician: Kerby Nora, MD Primary Cardiologist: New    HPI: [name] is a 79 y.o. male with no prior cardiac history, PMHx s/f Alzheimer's disease, type 2 DM, HTN, HLD, h/o tobacco abuse, h/o prostate CA (s/p prostatectomy) and h/o lung CA (s/p LUL lobectomy) who was direct-admitted from his PCP's office today with chest pain and EKG changes concerning for STEMI.    He was in his USOH until 2-3 days ago when he began experiencing sharp substernal chest pain without radiation provoked on inspiration and supine position. He does endorse relief with "sitting up." He does note some shortness of breath ("hard to take a deep breath" ), otherwise no associated symptoms. He had performed yard work just prior and attributed it to a strained muscle. The discomfort persisted and prevented him from sleeping last night. He specifically denies PND, orthopnea, palpitations, lightheadedness or syncope. He denies active bleeding. He denies fevers, chills or cough. He does note that his seasonal allergies have been "worse" recently. His symptoms persisted and he presented to his PCP's office.    There, EKG revealed Q waves V1, V2, ST elevations I, aVL. Rate 114 bpm. Given the patient's history of two prior malignancies and multiple cardiac risk factors, the concern was for ACS vs PE. EMS was called. He was given O2 and ASA 81mg  x 4. On EMS arrival, ST elevations were appreciated in III, aVF as well. Code STEMI was called and he was transported emergently to the [facility] cardiac cath lab.     Problem List: Past Medical History    Diagnosis  Date   .  Unspecified essential hypertension     .  Alzheimer's disease     .  Anxiety states     .  Malignant neoplasm of prostate     .  Cellulitis and abscess of unspecified site     .  Personal history of malignant neoplasm of bronchus and lung     .  Squamous cell carcinoma of skin     .  Lung cancer, lower lobe  07/25/2011       T1N0 stage I  adenoca left lung resected 01/03/00   .  Prostate ca  07/25/2011       Gleason 7  S/P prostatectomy 04/11/01   .  Nonmelanoma skin cancer  07/25/2011       Multiple lesions excised face/nose   .  History of colonic polyps  07/25/2011   .  Type 2 diabetes mellitus         Hgb A1C 7.4% 08/2012      Past Surgical History   Procedure  Laterality  Date   .  Lobectomy    2001       upper left   .  Prostatectomy       .  Hernia repair       .  Tonsillectomy          Allergies:  Allergies   Allergen  Reactions   .  Sulfa Antibiotics  Nausea Only      Home Medications: Prior  to Admission medications    Medication  Sig  Start Date  End Date  Taking?  Authorizing Provider   atorvastatin (LIPITOR) 40 MG tablet  TAKE 1 TABLET DAILY  09/24/11      [name] Michelle Nasuti, MD   donepezil (ARICEPT) 10 MG tablet  Take 1 tablet (10 mg total) by mouth at bedtime.  05/08/12      [name] Michelle Nasuti, MD   Multiple Vitamins-Minerals (EQL PROTECTAVISION PO)  Take 1 tablet by mouth daily.          Historical Provider, MD   Omega-3 Fatty Acids (FISH OIL) 1000 MG CAPS  Take by mouth daily.          Historical Provider, MD   sertraline (ZOLOFT) 50 MG tablet  TAKE 1 TABLET DAILY  05/08/12      [name] E Ermalene Searing, MD   sitaGLIPtin (JANUVIA) 100 MG tablet  Take 1 tablet (100 mg total) by mouth daily.  08/09/12      Excell Seltzer, MD        Inpatient Medications:      No prescriptions prior to admission       Family History   Problem  Relation  Age of Onset   .  Dementia  Mother     .  Diabetes  Mother     .  Cancer  Father         colon   .  Cancer  Brother          lung      Denies familial cardiac history.     History       Social History   .  Marital Status:  Married       Spouse Name:  N/A       Number of Children:  N/A   .  Years of Education:  N/A       Occupational History   .  retired         Social History Main Topics   .  Smoking status:  Former Games developer   .  Smokeless tobacco:  Former Neurosurgeon         Comment: previously smoked 4 PPD; quit over 30 years   .  Alcohol Use:  No   .  Drug Use:  No   .  Sexually Active:  Not on file       Other Topics  Concern   .  Not on file       Social History Narrative     Lives in [place], [place]     Regular exercise--no, mowing grass     Diet: fruit and veggies      Review of Systems: General: negative for chills, fever, night sweats or weight changes.   Cardiovascular: positive for chest pain, shortness of breath, negative for edema, orthopnea, palpitations, paroxysmal nocturnal dyspnea or shortness of breath Dermatological: negative for rash Respiratory: negative for cough or wheezing Urologic: negative for hematuria Abdominal: negative for nausea, vomiting, diarrhea, bright red blood per rectum, melena, or hematemesis Neurologic:  negative for visual changes, syncope, or dizziness All other systems reviewed and are otherwise negative except as noted above.   Physical Exam:   BP 121/70, HR 83, RR 23, O2 96% on 2L va n/c   General: Elderly male in NAD Head: Normocephalic, atraumatic, sclera non-icteric, no xanthomas, nares are without discharge Neck: Negative for carotid bruits. JVD not elevated. Lungs: Clear bilaterally to auscultation without wheezes, rales,  or rhonchi. Breathing is unlabored. Heart: RRR with S1 S2. No murmurs, rubs, or gallops appreciated. Abdomen: Soft, non-tender, non-distended with normoactive bowel sounds. No hepatomegaly. No rebound/guarding. No obvious abdominal masses. Msk:  Strength and tone appears normal for age. Extremities: No clubbing,  cyanosis or edema.  Distal pedal pulses are 2+ and equal bilaterally. Neuro: Alert and oriented X 3. Moves all extremities spontaneously. Psych:  Responds to questions appropriately with a normal affect.   Labs:   Pending   Radiology/Studies: No results found.   EKG: Sinus tachycardia, ST elevation (1 mm) I, aVL, II, III, aVF, Q waves V1, V2   ASSESSMENT AND PLAN:    79 y.o. male with no prior cardiac history, PMHx s/f Alzheimer's disease, type 2 DM, HTN, HLD, h/o tobacco abuse, h/o prostate CA (s/p prostatectomy) and h/o lung CA (s/p LUL lobectomy) who was direct-admitted from his PCP's office today with chest pain and EKG changes concerning for STEMI.    1. STEMI/Inferolateral ST elevations- new from prior EKG in 2009. Chest pain description most closely reflects a pericarditis picture; however, given age, HTN, DM2, HLD and tobacco abuse history ACS certainly is a concern. The patient is currently undergoing diagnostic cardiac catheterization for further evaluation. Further recommendations will be based on the interventionalist's findings.               - Start low-dose ASA, add BB, increase statin, NTG SL PRN              2. Type 2 DM- intolerant to Metformin (diarrhea).             - SSI while inpatient             - Consider sulfonylurea at discharge   3. Hypertension- BP 120/70 at PCP's office             - Low-dose BB if BP can tolerate   4. Hyperlipidemia- given DM2 and depending on CAD findings on cath, would warrant high-potency statin.             - Atorvastatin 80mg  PO daily   5. Alzheimer's disease             - Continue aricept   Will admit to CCU. Check TSH, CBC, CMET, CXR, cycle cardiac biomarkers, heart healthy diet. Consider 2D echo.    Signed, R. Hurman Horn, PA-C 09/13/2012, 2:23 PM    Patient seen, examined. Available data reviewed. Agree with findings, assessment, and plan as outlined by Hurman Horn, PA-C. Patient with inferior STE in setting  of ongoing chest pain. CP characteristics atypical for STEMI with pleuritic component, ongoing x 3 days. However, considering advanced age and EKG with most significant changes inferiorly, I think cardiac cath is indicated. We discussed risks, indication, and alternatives to cath and PCI. He agrees to proceed.   Tonny Bollman, M.D. 09/13/2012 10:42 PM

## 2012-09-13 NOTE — Progress Notes (Signed)
Nature conservation officer at [facility] [address] [place] [address] Phone: [phone] Fax: [phone]  Date:  09/13/2012   Name:  [name]   DOB:  March 21, 1931   MRN:  [id number] Gender: male Age: 79 y.o.  Primary Physician:  Kerby Nora, MD  Evaluating MD: Hannah Beat, MD   Chief Complaint: No chief complaint on file.   History of Present Illness:  [name] is a 79 y.o. pleasant patient who presents with the following:  79 yo:  3 day history of pain all throughout his chest. They had thought maybe had pulled a muscle. BP was ok at the time. If laid still, then did not hurt quite as bad.   Substernal chest pain really bad. Told wife was hurting really bad this morning and it persists. Substernal.   Ex-smoker. Smoked 30-40 years.  No FH CAD DM HTN Lipids No drugs H/o lung cancer H/o prostate cancer  Patient Active Problem List   Diagnosis Date Noted  . Acute bronchitis 08/09/2012  . Lung cancer, lower lobe 07/25/2011  . Prostate ca 07/25/2011  . Nonmelanoma skin cancer 07/25/2011  . History of colonic polyps 07/25/2011  . VERTIGO 07/22/2008  . DIABETES MELLITUS 05/21/2007  . HYPERCHOLESTEROLEMIA 05/21/2007  . CARCINOMA, SKIN, SQUAMOUS CELL 01/08/2007  . NEOP, MALIGNANT, PROSTATE 01/08/2007  . ANXIETY 01/08/2007  . ALZHEIMER'S DISEASE, MILD 01/08/2007  . HYPERTENSION 01/08/2007  . HX, PERSONAL, MALIGNANCY, BRONCHUS/LUNG 01/08/2007    Past Medical History  Diagnosis Date  . Unspecified essential hypertension   . Alzheimer's disease   . Anxiety states   . Malignant neoplasm of prostate   . Cellulitis and abscess of unspecified site   . Personal history of malignant neoplasm of bronchus and lung   . Squamous cell carcinoma of skin   . Lung cancer, lower lobe 07/25/2011    T1N0 stage I  adenoca left lung resected 01/03/00  . Prostate ca 07/25/2011    Gleason 7  S/P prostatectomy 04/11/01  . Nonmelanoma skin cancer 07/25/2011   Multiple lesions excised face/nose  . History of colonic polyps 07/25/2011    Past Surgical History  Procedure Laterality Date  . Lobectomy      upper left  . Prostatectomy    . Hernia repair    . Tonsillectomy      History   Social History  . Marital Status: Married    Spouse Name: N/A    Number of Children: N/A  . Years of Education: N/A   Occupational History  . retired    Social History Main Topics  . Smoking status: Former Games developer  . Smokeless tobacco: Former Neurosurgeon     Comment: quit over 30 years  . Alcohol Use: No  . Drug Use: No  . Sexually Active: Not on file   Other Topics Concern  . Not on file   Social History Narrative   Regular exercise--no, mowing grass      Diet: fruit and veggies    Family History  Problem Relation Age of Onset  . Dementia Mother   . Diabetes Mother   . Cancer Father     colon  . Cancer Brother     lung    Allergies  Allergen Reactions  . Sulfa Antibiotics Nausea Only    Medication list has been reviewed and updated.  Outpatient Prescriptions Prior to Visit  Medication Sig Dispense Refill  . atorvastatin (LIPITOR) 40 MG tablet TAKE 1 TABLET DAILY  90  tablet  3  . donepezil (ARICEPT) 10 MG tablet Take 1 tablet (10 mg total) by mouth at bedtime.  90 tablet  1  . Multiple Vitamins-Minerals (EQL PROTECTAVISION PO) Take 1 tablet by mouth daily.        . Omega-3 Fatty Acids (FISH OIL) 1000 MG CAPS Take by mouth daily.        . sertraline (ZOLOFT) 50 MG tablet TAKE 1 TABLET DAILY  90 tablet  3  . sitaGLIPtin (JANUVIA) 100 MG tablet Take 1 tablet (100 mg total) by mouth daily.  30 tablet  11  . metFORMIN (GLUMETZA) 500 MG (MOD) 24 hr tablet Take 500 mg by mouth daily with breakfast.       . moxifloxacin (AVELOX) 400 MG tablet Take 1 tablet (400 mg total) by mouth daily.  5 tablet  0   No facility-administered medications prior to visit.    Review of Systems:  CP as above. No fever, chills, sweats. No neurological  symptoms. No n/v/d. No weakness. Otherwise, the pertinent positives and negatives are listed above and in the HPI, otherwise a full review of systems has been reviewed and is negative unless noted positive.   Physical Examination: BP 120/70  Pulse 117  Temp(Src) 99.2 F (37.3 C) (Oral)  Wt 184 lb (83.462 kg)  BMI 25.67 kg/m2  SpO2 98%  Ideal Body Weight:     GEN: WDWN, NAD, Non-toxic, A & O x 3 HEENT: Atraumatic, Normocephalic. Neck supple. No masses, No LAD. Ears and Nose: No external deformity. CV: tachycardic to 120, No M/G/R. No JVD. No thrill. No extra heart sounds. PULM: CTA B, no wheezes, crackles, rhonchi. No retractions. No resp. distress. No accessory muscle use. EXTR: No c/c/e NEURO Normal gait.  PSYCH: Normally interactive. Conversant. Not depressed or anxious appearing.  Calm demeanor.    Assessment and Plan:  Chest pain - Plan: EKG 12-Lead  Prostate ca  Lung cancer, lower lobe, unspecified laterality  HX, PERSONAL, MALIGNANCY, BRONCHUS/LUNG  DIABETES MELLITUS  NEOP, MALIGNANT, PROSTATE  HYPERTENSION  HYPERCHOLESTEROLEMIA  Acute chest pain and tachycardia to 120 in a patient with 2 prior malignancies and multiple CAD risk factors and EKG changes.  EMS activated. Placed on 2 L o2 and given 4 baby ASA 81 mg.  EKG. Tachy to 120. Nonspecific T wave changes.   Clinical concern for potential pulmonary embolus or cardiac event.  Orders Today:  Orders Placed This Encounter  Procedures  . EKG 12-Lead    Updated Medication List: (Includes new medications, updates to list, dose adjustments) No orders of the defined types were placed in this encounter.    Medications Discontinued: Medications Discontinued During This Encounter  Medication Reason  . metFORMIN (GLUMETZA) 500 MG (MOD) 24 hr tablet Error  . moxifloxacin (AVELOX) 400 MG tablet Error      Signed, Maysun Meditz T. Amaziah Ghosh, MD 09/13/2012 12:30 PM

## 2012-09-13 NOTE — CV Procedure (Signed)
   Cardiac Catheterization Procedure Note  Name: [name] MRN: [id number] DOB: [birth year]  Procedure: Left Heart Cath, Selective Coronary Angiography, LV angiography  Indication: 79 year old gentleman with no past cardiac history. He presented with 3 days of chest pain. His pain had pleuritic characteristics. His EKG was concerning for an acute inferior wall MI. However, pericarditis was in the differential diagnosis is there was diffuse ST segment elevation, albeit more pronounced in the inferior leads. We elected to proceed with urgent cardiac catheterization and possible PCI. The plan was reviewed with the patient and his wife before the procedure. They understood the risks, indications, and alternatives to this approach and agreed to proceed   Procedural Details: The right wrist was prepped, draped, and anesthetized with 1% lidocaine. Using the modified Seldinger technique, a 5/6 French sheath was introduced into the right radial artery. 3 mg of verapamil was administered through the sheath, weight-based unfractionated heparin was administered intravenously. Standard Judkins catheters were used for selective coronary angiography and left ventriculography. Catheter exchanges were performed over an exchange length guidewire. There were no immediate procedural complications. A TR band was used for radial hemostasis at the completion of the procedure.  The patient was transferred to the post catheterization recovery area for further monitoring.  Procedural Findings: Hemodynamics: AO 91/61 with a mean of 73 LV 85/17  Coronary angiography: Coronary dominance: right  Left mainstem: The left main is widely patent. There is minor irregularity.  Left anterior descending (LAD): The LAD is very large in caliber. There is diffuse irregularity present. The proximal LAD has 50-60% stenosis. At the origin of the first diagonal branch there is an eccentric 60-70% stenosis. The mid and distal LAD are  free of significant disease. The first diagonal has 50% ostial stenosis.  Left circumflex (LCx): The left circumflex is moderate to large in caliber. There are no significant stenoses present. The vessel gives off a large caliber single obtuse marginal with no significant disease. There is a small intermediate branch with no significant disease.  Right coronary artery (RCA): The RCA is very large in caliber. There is diffuse irregularity but no significant stenoses. The PDA and PLA branches are patent.  Left ventriculography: Left ventricular systolic function is normal, LVEF is estimated at 55-65%, there is no significant mitral regurgitation   Final Conclusions:   1. Moderate mid LAD stenosis involving the first diagonal branch. 2. Minor nonobstructive stenosis of the left circumflex and right coronary arteries. 3. Normal left ventricular systolic function  Recommendations: The patient's clinical syndrome is consistent with acute pericarditis. We will treat him with high-dose aspirin and colchicine. An echocardiogram will be done. Consider noninvasive evaluation for his LAD stenosis after he recovers.  Tonny Bollman 09/13/2012, 2:39 PM

## 2012-09-13 NOTE — Progress Notes (Signed)
Responded to code stemi and met pt's wife and family friend in ed waiting area. Dr. Excell Seltzer spoke w/family shortly after their arrival. Pt's son and daughter joined [place] later. Gave pt's wife a pgr and explained dr/nurse would page them when they are ready to talk w/them. Pt's family was grateful for Chaplain spt and help. Marjory Lies Chaplain  09/13/12 1400  Clinical Encounter Type  Visited With Family

## 2012-09-13 NOTE — Progress Notes (Signed)
Critical lab value PTT of >200; Dr. Excell Seltzer notified; no new orders received; will continue to monitor closely and update as needed

## 2012-09-13 NOTE — Telephone Encounter (Signed)
Pt came in for visit, was seen by Dr. Patsy Lager and was sent to hospital via EMS.

## 2012-09-13 NOTE — H&P (Signed)
See my note this same date.

## 2012-09-14 DIAGNOSIS — I309 Acute pericarditis, unspecified: Principal | ICD-10-CM

## 2012-09-14 DIAGNOSIS — I10 Essential (primary) hypertension: Secondary | ICD-10-CM

## 2012-09-14 DIAGNOSIS — I319 Disease of pericardium, unspecified: Secondary | ICD-10-CM

## 2012-09-14 LAB — POCT I-STAT, CHEM 8
BUN: 16 mg/dL (ref 6–23)
Calcium, Ion: 1.14 mmol/L (ref 1.13–1.30)
Chloride: 103 mEq/L (ref 96–112)
Creatinine, Ser: 1.2 mg/dL (ref 0.50–1.35)
Glucose, Bld: 197 mg/dL — ABNORMAL HIGH (ref 70–99)
HCT: 35 % — ABNORMAL LOW (ref 39.0–52.0)
Hemoglobin: 11.9 g/dL — ABNORMAL LOW (ref 13.0–17.0)
Potassium: 4.2 mEq/L (ref 3.5–5.1)
Sodium: 137 mEq/L (ref 135–145)
TCO2: 23 mmol/L (ref 0–100)

## 2012-09-14 LAB — CK TOTAL AND CKMB (NOT AT ARMC)
CK, MB: 3.7 ng/mL (ref 0.3–4.0)
Relative Index: 1.5 (ref 0.0–2.5)
Total CK: 253 U/L — ABNORMAL HIGH (ref 7–232)

## 2012-09-14 MED ORDER — LEVALBUTEROL HCL 0.63 MG/3ML IN NEBU
0.6300 mg | INHALATION_SOLUTION | Freq: Once | RESPIRATORY_TRACT | Status: AC
  Administered 2012-09-14: 0.63 mg via RESPIRATORY_TRACT
  Filled 2012-09-14: qty 3

## 2012-09-14 NOTE — Progress Notes (Signed)
Brief Nutrition Note:  RD pulled to pt for malnutrition screening tool report of unintentional weight loss and poor oral intake.   Pt reports about 5-10 lb weight loss over several months, attributes it to decreased intake with the hot weather. Weight hx reviewed, pt weight appears stable.  Wt Readings from Last 5 Encounters:  09/13/12 190 lb 0.6 oz (86.2 kg)  09/13/12 184 lb (83.462 kg)  09/13/12 190 lb 0.6 oz (86.2 kg)  08/09/12 188 lb 12 oz (85.616 kg)  07/24/12 190 lb 9.6 oz (86.456 kg)  Body mass index is 26.52 kg/(m^2). Overweight  Diet: Heart Healthy, eating >75% per pt report.   Chart reviewed, no nutrition interventions warranted at this time. Please consult as needed.   Clarene Duke RD, LDN Pager [phone] After Hours pager [phone]

## 2012-09-14 NOTE — Progress Notes (Signed)
Inpatient Diabetes Program Recommendations  AACE/ADA: New Consensus Statement on Inpatient Glycemic Control (2013)  Target Ranges:  Prepandial:   less than 140 mg/dL      Peak postprandial:   less than 180 mg/dL (1-2 hours)      Critically ill patients:  140 - 180 mg/dL   Inpatient Diabetes Program Recommendations Correction (SSI): start Novolog SENSITIVE scale TID + HS while hospitalized HgbA1C: =7.6 Thank you  Piedad Climes BSN, RN,CDE Inpatient Diabetes Coordinator [phone] (team pager)

## 2012-09-14 NOTE — Progress Notes (Signed)
  Echocardiogram 2D Echocardiogram has been performed.  [name] 09/14/2012, 3:48 PM

## 2012-09-14 NOTE — Progress Notes (Signed)
Utilization review completed. Toriann Spadoni, RN, BSN. 

## 2012-09-14 NOTE — Progress Notes (Signed)
    SUBJECTIVE: Feeling better. Still having mild chest pain with inspiration. NO SOB.   BP 119/61  Pulse 82  Temp(Src) 97.8 F (36.6 C) (Oral)  Resp 22  Ht 5\' 11"  (1.803 m)  Wt 190 lb 0.6 oz (86.2 kg)  BMI 26.52 kg/m2  SpO2 95%  Intake/Output Summary (Last 24 hours) at 09/14/12 4098 Last data filed at 09/14/12 0000  Gross per 24 hour  Intake    750 ml  Output    150 ml  Net    600 ml    PHYSICAL EXAM General: Well developed, well nourished, in no acute distress. Alert and oriented x 3.  Psych:  Good affect, responds appropriately Neck: No JVD. No masses noted.  Lungs: Clear bilaterally with no wheezes or rhonci noted.  Heart: RRR with no murmurs noted. No rub.  Abdomen: Bowel sounds are present. Soft, non-tender.  Extremities: No lower extremity edema.   LABS: Basic Metabolic Panel:  Recent Labs  11/91/47 1400 09/13/12 1715  NA 132*  --   K 4.0  --   CL 100  --   CO2 23  --   GLUCOSE 201*  --   BUN 16  --   CREATININE 1.11 1.14  CALCIUM 8.0*  --    CBC:  Recent Labs  09/13/12 1400 09/13/12 1715  WBC 12.7* 11.8*  HGB 12.0* 12.3*  HCT 35.5* 37.1*  MCV 93.4 94.6  PLT 135* 132*   Cardiac Enzymes:  Recent Labs  09/13/12 1400 09/13/12 1715 09/13/12 2107 09/14/12 0400  CKTOTAL 32 33 31 253*  CKMB 1.0 1.1 1.2 3.7  TROPONINI <0.30  --   --   --    Fasting Lipid Panel:  Recent Labs  09/13/12 1400  CHOL 91  HDL 37*  LDLCALC 39  TRIG 77  CHOLHDL 2.5    Current Meds: . aspirin EC  650 mg Oral BID  . atorvastatin  40 mg Oral q1800  . colchicine  0.6 mg Oral BID  . donepezil  10 mg Oral QHS  . enoxaparin (LOVENOX) injection  40 mg Subcutaneous Q24H  . linagliptin  5 mg Oral Daily  . pantoprazole  40 mg Oral Daily  . sertraline  50 mg Oral QHS     ASSESSMENT AND PLAN: 32 male with history of DM, HTN, HLD, Alzheimers, prostate CA, Lung CA admitted 09/13/12 with diffuse ST elevation, chest pain. Emergent cath per Dr. Excell Seltzer with  non-obstructive CAD. He is felt to have acute pericarditis.   1. Acute pericarditis:  Continue treatment with high dose ASA and colchicine. Echo today to assess LV function, exclude pericardial effusion. He is currently not tachycardic or hypotensive.   2. HTN: BP controlled.   Will watch in stepdown unit this am. He could be transferred out to telemetry today if he is stable.   Ineta Sinning  6/13/20148:08 AM

## 2012-09-14 NOTE — Progress Notes (Signed)
Called by RN re: pt's echo results. Echo revealed EF 60-65%, grade 1 diastolic dysfunction, mild LVH, mild AI and moderate-large free-flowing pericardial effusion circumferential to the heart. This was called to the nurse, and apparently concern was for tamponadic features, although not explicit on formal echo report. He denies chest pain or shortness of breath. On exam, patient is tachypneic (RR 22), tachycardic (105 bpm), BP 144/70. Lungs clear. Heart with distant sounds, clear S1, S2, no murmurs or rubs. No peripheral edema. No JVD. Discussed with Dr. Diona Browner. Patient is currently hemodynamically stable. Will page TCTS to make aware in case effusion expands requiring pericardiocentesis and/or scheduled window. Pt will need limited study in 1-2 days to evaluate status of effusion otherwise. Will inform overnight fellow as well. The patient and family were updated.   Jacqulyn Bath, PA-C 09/14/2012 7:23 PM

## 2012-09-15 DIAGNOSIS — I319 Disease of pericardium, unspecified: Secondary | ICD-10-CM

## 2012-09-15 LAB — CBC
HCT: 37 % — ABNORMAL LOW (ref 39.0–52.0)
Hemoglobin: 12.3 g/dL — ABNORMAL LOW (ref 13.0–17.0)
MCH: 31.7 pg (ref 26.0–34.0)
MCHC: 33.2 g/dL (ref 30.0–36.0)
MCV: 95.4 fL (ref 78.0–100.0)
Platelets: 151 10*3/uL (ref 150–400)
RBC: 3.88 MIL/uL — ABNORMAL LOW (ref 4.22–5.81)
RDW: 13.4 % (ref 11.5–15.5)
WBC: 10 10*3/uL (ref 4.0–10.5)

## 2012-09-15 LAB — BASIC METABOLIC PANEL
BUN: 19 mg/dL (ref 6–23)
CO2: 27 mEq/L (ref 19–32)
Calcium: 8.7 mg/dL (ref 8.4–10.5)
Chloride: 102 mEq/L (ref 96–112)
Creatinine, Ser: 1.18 mg/dL (ref 0.50–1.35)
GFR calc Af Amer: 66 mL/min — ABNORMAL LOW (ref >90)
GFR calc non Af Amer: 57 mL/min — ABNORMAL LOW (ref >90)
Glucose, Bld: 187 mg/dL — ABNORMAL HIGH (ref 70–99)
Potassium: 4.5 mEq/L (ref 3.5–5.1)
Sodium: 138 mEq/L (ref 135–145)

## 2012-09-15 LAB — GLUCOSE, CAPILLARY
Glucose-Capillary: 280 mg/dL — ABNORMAL HIGH (ref 70–99)
Glucose-Capillary: 177 mg/dL — ABNORMAL HIGH (ref 70–99)
Glucose-Capillary: 196 mg/dL — ABNORMAL HIGH (ref 70–99)

## 2012-09-15 MED ORDER — COLCHICINE 0.6 MG PO TABS
0.6000 mg | ORAL_TABLET | Freq: Two times a day (BID) | ORAL | Status: DC
  Administered 2012-09-15 – 2012-09-20 (×10): 0.6 mg via ORAL
  Filled 2012-09-15 (×12): qty 1

## 2012-09-15 MED ORDER — ASPIRIN EC 325 MG PO TBEC
650.0000 mg | DELAYED_RELEASE_TABLET | Freq: Three times a day (TID) | ORAL | Status: DC
  Administered 2012-09-15 (×2): 650 mg via ORAL
  Filled 2012-09-15 (×4): qty 2

## 2012-09-15 MED ORDER — INSULIN ASPART 100 UNIT/ML ~~LOC~~ SOLN: 0.0000 [IU] | Freq: Every day | SUBCUTANEOUS | Status: DC

## 2012-09-15 MED ORDER — INSULIN ASPART 100 UNIT/ML ~~LOC~~ SOLN
0.0000 [IU] | Freq: Three times a day (TID) | SUBCUTANEOUS | Status: DC
  Administered 2012-09-15: 8 [IU] via SUBCUTANEOUS
  Administered 2012-09-15: 3 [IU] via SUBCUTANEOUS
  Administered 2012-09-16: 8 [IU] via SUBCUTANEOUS

## 2012-09-15 MED ORDER — LINAGLIPTIN 5 MG PO TABS
5.0000 mg | ORAL_TABLET | Freq: Every day | ORAL | Status: DC
  Administered 2012-09-16: 5 mg via ORAL
  Filled 2012-09-15: qty 1

## 2012-09-15 MED ORDER — ATORVASTATIN CALCIUM 40 MG PO TABS
40.0000 mg | ORAL_TABLET | Freq: Every day | ORAL | Status: DC
  Administered 2012-09-17 – 2012-09-19 (×3): 40 mg via ORAL
  Filled 2012-09-15 (×6): qty 1

## 2012-09-15 MED ORDER — ENOXAPARIN SODIUM 40 MG/0.4ML ~~LOC~~ SOLN
40.0000 mg | Freq: Every day | SUBCUTANEOUS | Status: DC
  Filled 2012-09-15: qty 0.4

## 2012-09-15 MED ORDER — SERTRALINE HCL 50 MG PO TABS
50.0000 mg | ORAL_TABLET | Freq: Every day | ORAL | Status: DC
  Administered 2012-09-15 – 2012-09-19 (×5): 50 mg via ORAL
  Filled 2012-09-15 (×7): qty 1

## 2012-09-15 MED ORDER — ASPIRIN EC 325 MG PO TBEC
650.0000 mg | DELAYED_RELEASE_TABLET | Freq: Three times a day (TID) | ORAL | Status: DC
  Administered 2012-09-15 – 2012-09-16 (×2): 650 mg via ORAL
  Filled 2012-09-15 (×5): qty 2

## 2012-09-15 MED ORDER — LEVALBUTEROL HCL 0.63 MG/3ML IN NEBU
0.6300 mg | INHALATION_SOLUTION | Freq: Once | RESPIRATORY_TRACT | Status: AC
  Administered 2012-09-15: 0.63 mg via RESPIRATORY_TRACT
  Filled 2012-09-15: qty 3

## 2012-09-15 NOTE — Progress Notes (Signed)
Pt's family member asked if it was ok for pt to go to bathroom, this RN explained to pt and family member that pt appears very SOB even at rest and that it was probably a good idea that the pt use a urinal / bed pan so as to avoid decompensation.Pt and family member both vu. Raised pt's HOB up and pt started to wheeze very badly, desating into 70's, o2 titrated to 4L, on call MD (Dr. Antoine Poche) notified. One time order received for xopenex, RT notified. Will cont to monitor.

## 2012-09-15 NOTE — Progress Notes (Signed)
Patient ID: [name], male   DOB: Nov 07, 1930, 79 y.o.   MRN: [id number]    SUBJECTIVE: Feels much better than last night.  Still chest pain with deep breathing.  No dyspnea at rest.    BP 125/73  Pulse 92  Temp(Src) 98.6 F (37 C) (Oral)  Resp 20  Ht 5\' 11"  (1.803 m)  Wt 190 lb 0.6 oz (86.2 kg)  BMI 26.52 kg/m2  SpO2 98%  Intake/Output Summary (Last 24 hours) at 09/15/12 0916 Last data filed at 09/15/12 0600  Gross per 24 hour  Intake    560 ml  Output    400 ml  Net    160 ml    PHYSICAL EXAM General: Well developed, well nourished, in no acute distress. Alert and oriented x 3.  Psych:  Good affect, responds appropriately Neck: JVP 8-9 cm Lungs: Clear bilaterally with no wheezes or rhonci noted.  Heart: RRR with no murmurs noted, soft heart sounds. No rub.  Abdomen: Bowel sounds are present. Soft, non-tender.  Extremities: No lower extremity edema. No pulsus palpating radial artery at wrist.   LABS: Basic Metabolic Panel:  Recent Labs  42/59/56 1400 09/13/12 1420 09/13/12 1715 09/15/12 0550  NA 132* 137  --  138  K 4.0 4.2  --  4.5  CL 100 103  --  102  CO2 23  --   --  27  GLUCOSE 201* 197*  --  187*  BUN 16 16  --  19  CREATININE 1.11 1.20 1.14 1.18  CALCIUM 8.0*  --   --  8.7   CBC:  Recent Labs  09/13/12 1715 09/15/12 0550  WBC 11.8* 10.0  HGB 12.3* 12.3*  HCT 37.1* 37.0*  MCV 94.6 95.4  PLT 132* 151   Cardiac Enzymes:  Recent Labs  09/13/12 1400 09/13/12 1715 09/13/12 2107 09/14/12 0400  CKTOTAL 32 33 31 253*  CKMB 1.0 1.1 1.2 3.7  TROPONINI <0.30  --   --   --    Fasting Lipid Panel:  Recent Labs  09/13/12 1400  CHOL 91  HDL 37*  LDLCALC 39  TRIG 77  CHOLHDL 2.5    Current Meds: . aspirin EC  650 mg Oral Q8H  . atorvastatin  40 mg Oral q1800  . colchicine  0.6 mg Oral BID  . donepezil  10 mg Oral QHS  . enoxaparin (LOVENOX) injection  40 mg Subcutaneous Q24H  . insulin aspart  0-15 Units Subcutaneous TID WC  .  insulin aspart  0-5 Units Subcutaneous QHS  . linagliptin  5 mg Oral Daily  . pantoprazole  40 mg Oral Daily  . sertraline  50 mg Oral QHS     ASSESSMENT AND PLAN: 17 male with history of DM, HTN, HLD, Alzheimers, prostate CA, Lung CA admitted 09/13/12 with diffuse ST elevation, chest pain. Emergent cath per Dr. Excell Seltzer with non-obstructive CAD (moderate LAD stenosis). He is felt to have acute pericarditis.  Echo yesterday showed preserved EF but moderate-large pericardial effusion.   1. Acute pericarditis/pericardial effusion:  Still chest pain with deep breathing.  A moderate-large pericardial effusion was seen by echo yesterday.  There was some concern for possible early tamponade.  This morning, he is stable symptomatically and BP/HR are ok.  He does not have a palpable pulsus at the wrist.  Will continue treatment with high dose ASA (increase to every 8 hrs) and colchicine.  Repeat echo Monday morning to follow effusion.  If there are signs  of tamponade, will need pericardiocentesis.   2. CAD: Nonobstructive.  On ASA and statin.    [name]  6/14/20149:16 AM

## 2012-09-16 ENCOUNTER — Inpatient Hospital Stay (HOSPITAL_COMMUNITY): Payer: Medicare Other

## 2012-09-16 ENCOUNTER — Encounter (HOSPITAL_COMMUNITY): Payer: Self-pay | Admitting: Anesthesiology

## 2012-09-16 ENCOUNTER — Inpatient Hospital Stay (HOSPITAL_COMMUNITY): Payer: Medicare Other | Admitting: Anesthesiology

## 2012-09-16 ENCOUNTER — Encounter (HOSPITAL_COMMUNITY)
Admission: AD | Disposition: A | Discharge status: Home / Self Care | Payer: Self-pay | Source: Ambulatory Visit | Attending: Cardiovascular Disease

## 2012-09-16 ENCOUNTER — Ambulatory Visit (HOSPITAL_COMMUNITY): Admit: 2012-09-16 | Payer: Self-pay | Admitting: Interventional Cardiology

## 2012-09-16 DIAGNOSIS — I314 Cardiac tamponade: Secondary | ICD-10-CM

## 2012-09-16 HISTORY — PX: PERICARDIAL TAP: SHX5486

## 2012-09-16 HISTORY — PX: SUBXYPHOID PERICARDIAL WINDOW: SHX5075

## 2012-09-16 LAB — APTT: aPTT: 45 seconds — ABNORMAL HIGH (ref 24–37)

## 2012-09-16 LAB — COMPREHENSIVE METABOLIC PANEL
ALT: 14 U/L (ref 0–53)
AST: 15 U/L (ref 0–37)
Albumin: 2.8 g/dL — ABNORMAL LOW (ref 3.5–5.2)
Alkaline Phosphatase: 79 U/L (ref 39–117)
BUN: 24 mg/dL — ABNORMAL HIGH (ref 6–23)
CO2: 30 mEq/L (ref 19–32)
Calcium: 9.1 mg/dL (ref 8.4–10.5)
Chloride: 102 mEq/L (ref 96–112)
Creatinine, Ser: 1.15 mg/dL (ref 0.50–1.35)
GFR calc Af Amer: 68 mL/min — ABNORMAL LOW (ref >90)
GFR calc non Af Amer: 59 mL/min — ABNORMAL LOW (ref >90)
Glucose, Bld: 186 mg/dL — ABNORMAL HIGH (ref 70–99)
Potassium: 4.2 mEq/L (ref 3.5–5.1)
Sodium: 138 mEq/L (ref 135–145)
Total Bilirubin: 0.3 mg/dL (ref 0.3–1.2)
Total Protein: 6.6 g/dL (ref 6.0–8.3)

## 2012-09-16 LAB — CBC
HCT: 36.6 % — ABNORMAL LOW (ref 39.0–52.0)
HCT: 38.7 % — ABNORMAL LOW (ref 39.0–52.0)
Hemoglobin: 12.1 g/dL — ABNORMAL LOW (ref 13.0–17.0)
Hemoglobin: 12.7 g/dL — ABNORMAL LOW (ref 13.0–17.0)
MCH: 31.4 pg (ref 26.0–34.0)
MCH: 31.4 pg (ref 26.0–34.0)
MCHC: 33.1 g/dL (ref 30.0–36.0)
MCHC: 32.8 g/dL (ref 30.0–36.0)
MCV: 95.1 fL (ref 78.0–100.0)
MCV: 95.8 fL (ref 78.0–100.0)
Platelets: 173 10*3/uL (ref 150–400)
Platelets: 194 10*3/uL (ref 150–400)
RBC: 3.85 MIL/uL — ABNORMAL LOW (ref 4.22–5.81)
RBC: 4.04 MIL/uL — ABNORMAL LOW (ref 4.22–5.81)
RDW: 13.3 % (ref 11.5–15.5)
RDW: 13.2 % (ref 11.5–15.5)
WBC: 9.8 10*3/uL (ref 4.0–10.5)
WBC: 8.4 10*3/uL (ref 4.0–10.5)

## 2012-09-16 LAB — PROTIME-INR
INR: 1.24 (ref 0.00–1.49)
Prothrombin Time: 15.4 seconds — ABNORMAL HIGH (ref 11.6–15.2)

## 2012-09-16 LAB — BASIC METABOLIC PANEL
BUN: 22 mg/dL (ref 6–23)
CO2: 24 mEq/L (ref 19–32)
Calcium: 8.7 mg/dL (ref 8.4–10.5)
Chloride: 101 mEq/L (ref 96–112)
Creatinine, Ser: 1.14 mg/dL (ref 0.50–1.35)
GFR calc Af Amer: 69 mL/min — ABNORMAL LOW (ref >90)
GFR calc non Af Amer: 59 mL/min — ABNORMAL LOW (ref >90)
Glucose, Bld: 295 mg/dL — ABNORMAL HIGH (ref 70–99)
Potassium: 4 mEq/L (ref 3.5–5.1)
Sodium: 134 mEq/L — ABNORMAL LOW (ref 135–145)

## 2012-09-16 LAB — LACTATE DEHYDROGENASE: LDH: 2664 U/L — ABNORMAL HIGH (ref 94–250)

## 2012-09-16 LAB — GLUCOSE, CAPILLARY
Glucose-Capillary: 280 mg/dL — ABNORMAL HIGH (ref 70–99)
Glucose-Capillary: 219 mg/dL — ABNORMAL HIGH (ref 70–99)
Glucose-Capillary: 235 mg/dL — ABNORMAL HIGH (ref 70–99)

## 2012-09-16 LAB — BODY FLUID CELL COUNT WITH DIFFERENTIAL
Eos, Fluid: 2 %
Lymphs, Fluid: 21 %
Monocyte-Macrophage-Serous Fluid: 2 % — ABNORMAL LOW (ref 50–90)
Neutrophil Count, Fluid: 75 % — ABNORMAL HIGH (ref 0–25)
Total Nucleated Cell Count, Fluid: 26504 cu mm — ABNORMAL HIGH (ref 0–1000)

## 2012-09-16 LAB — PROTEIN, BODY FLUID: Total protein, fluid: 5 g/dL

## 2012-09-16 LAB — GLUCOSE, SEROUS FLUID: Glucose, Fluid: 205 mg/dL

## 2012-09-16 LAB — ABO/RH: ABO/RH(D): O POS

## 2012-09-16 SURGERY — PERICARDIAL TAP
Anesthesia: LOCAL | Site: Chest

## 2012-09-16 SURGERY — CREATION, PERICARDIAL WINDOW, SUBXIPHOID APPROACH
Anesthesia: General | Site: Chest | Wound class: Clean

## 2012-09-16 MED ORDER — DEXTROSE 5 % IV SOLN
INTRAVENOUS | Status: DC | PRN
  Administered 2012-09-16: 16:00:00 via INTRAVENOUS

## 2012-09-16 MED ORDER — GLYCOPYRROLATE 0.2 MG/ML IJ SOLN
INTRAMUSCULAR | Status: DC | PRN
  Administered 2012-09-16: 0.6 mg via INTRAVENOUS

## 2012-09-16 MED ORDER — VECURONIUM BROMIDE 10 MG IV SOLR
INTRAVENOUS | Status: DC | PRN
  Administered 2012-09-16: 4 mg via INTRAVENOUS

## 2012-09-16 MED ORDER — ARTIFICIAL TEARS OP OINT
TOPICAL_OINTMENT | OPHTHALMIC | Status: DC | PRN
  Administered 2012-09-16: 1 via OPHTHALMIC

## 2012-09-16 MED ORDER — MORPHINE SULFATE 2 MG/ML IJ SOLN
1.0000 mg | INTRAMUSCULAR | Status: DC | PRN
  Administered 2012-09-17 – 2012-09-18 (×3): 1 mg via INTRAVENOUS
  Filled 2012-09-16 (×3): qty 1

## 2012-09-16 MED ORDER — ETOMIDATE 2 MG/ML IV SOLN
INTRAVENOUS | Status: DC | PRN
  Administered 2012-09-16: 10 mg via INTRAVENOUS

## 2012-09-16 MED ORDER — DEXTROSE 5 % IV SOLN
1.5000 g | INTRAVENOUS | Status: AC
  Administered 2012-09-16: 1.5 g via INTRAVENOUS
  Filled 2012-09-16 (×2): qty 1.5

## 2012-09-16 MED ORDER — OXYCODONE-ACETAMINOPHEN 5-325 MG PO TABS
1.0000 | ORAL_TABLET | ORAL | Status: DC | PRN
  Administered 2012-09-17 – 2012-09-18 (×3): 2 via ORAL
  Administered 2012-09-19: 1 via ORAL
  Filled 2012-09-16 (×5): qty 2

## 2012-09-16 MED ORDER — DEXAMETHASONE SODIUM PHOSPHATE 4 MG/ML IJ SOLN
INTRAMUSCULAR | Status: DC | PRN
  Administered 2012-09-16: 8 mg via INTRAVENOUS

## 2012-09-16 MED ORDER — OXYCODONE HCL 5 MG PO TABS
5.0000 mg | ORAL_TABLET | ORAL | Status: AC | PRN
  Administered 2012-09-16: 10 mg via ORAL
  Administered 2012-09-17: 5 mg via ORAL
  Administered 2012-09-17: 10 mg via ORAL
  Filled 2012-09-16 (×2): qty 2
  Filled 2012-09-16: qty 1

## 2012-09-16 MED ORDER — BISACODYL 5 MG PO TBEC
10.0000 mg | DELAYED_RELEASE_TABLET | Freq: Every day | ORAL | Status: DC
  Administered 2012-09-17 – 2012-09-19 (×3): 10 mg via ORAL
  Filled 2012-09-16 (×4): qty 2

## 2012-09-16 MED ORDER — ONDANSETRON HCL 4 MG/2ML IJ SOLN
INTRAMUSCULAR | Status: DC | PRN
  Administered 2012-09-16: 4 mg via INTRAVENOUS

## 2012-09-16 MED ORDER — FENTANYL CITRATE 0.05 MG/ML IJ SOLN
INTRAMUSCULAR | Status: AC
  Filled 2012-09-16: qty 2

## 2012-09-16 MED ORDER — LIDOCAINE HCL (PF) 1 % IJ SOLN
INTRAMUSCULAR | Status: AC
  Filled 2012-09-16: qty 30

## 2012-09-16 MED ORDER — SODIUM CHLORIDE 0.9 % IV SOLN
1.0000 mL/kg/h | INTRAVENOUS | Status: DC
  Administered 2012-09-18: 0.232 mL/kg/h via INTRAVENOUS

## 2012-09-16 MED ORDER — INSULIN ASPART 100 UNIT/ML ~~LOC~~ SOLN
0.0000 [IU] | SUBCUTANEOUS | Status: DC
  Administered 2012-09-16: 8 [IU] via SUBCUTANEOUS
  Administered 2012-09-17: 4 [IU] via SUBCUTANEOUS
  Administered 2012-09-17: 3 [IU] via SUBCUTANEOUS
  Administered 2012-09-17: 4 [IU] via SUBCUTANEOUS
  Administered 2012-09-17: 2 [IU] via SUBCUTANEOUS
  Administered 2012-09-17 – 2012-09-18 (×2): 4 [IU] via SUBCUTANEOUS
  Administered 2012-09-18: 2 [IU] via SUBCUTANEOUS

## 2012-09-16 MED ORDER — VECURONIUM BROMIDE 10 MG IV SOLR: INTRAVENOUS | Status: DC | PRN

## 2012-09-16 MED ORDER — POTASSIUM CHLORIDE 10 MEQ/50ML IV SOLN
10.0000 meq | Freq: Every day | INTRAVENOUS | Status: DC | PRN
  Administered 2012-09-17 (×3): 10 meq via INTRAVENOUS
  Filled 2012-09-16: qty 50
  Filled 2012-09-16: qty 100
  Filled 2012-09-16: qty 50

## 2012-09-16 MED ORDER — HYDROMORPHONE HCL PF 1 MG/ML IJ SOLN: 0.2500 mg | INTRAMUSCULAR | Status: DC | PRN

## 2012-09-16 MED ORDER — NEOSTIGMINE METHYLSULFATE 1 MG/ML IJ SOLN
INTRAMUSCULAR | Status: DC | PRN
  Administered 2012-09-16: 5 mg via INTRAVENOUS

## 2012-09-16 MED ORDER — LACTATED RINGERS IV SOLN
INTRAVENOUS | Status: DC | PRN
  Administered 2012-09-16: 16:00:00 via INTRAVENOUS

## 2012-09-16 MED ORDER — ACETAMINOPHEN 325 MG PO TABS: 650.0000 mg | ORAL_TABLET | ORAL | Status: DC | PRN

## 2012-09-16 MED ORDER — FENTANYL CITRATE 0.05 MG/ML IJ SOLN
INTRAMUSCULAR | Status: DC | PRN
  Administered 2012-09-16 (×3): 100 ug via INTRAVENOUS

## 2012-09-16 MED ORDER — PROPOFOL 10 MG/ML IV BOLUS
INTRAVENOUS | Status: DC | PRN
  Administered 2012-09-16: 50 mg via INTRAVENOUS

## 2012-09-16 MED ORDER — SODIUM CHLORIDE 0.9 % IR SOLN
Status: DC | PRN
  Administered 2012-09-16: 1000 mL

## 2012-09-16 MED ORDER — ONDANSETRON HCL 4 MG/2ML IJ SOLN
4.0000 mg | Freq: Four times a day (QID) | INTRAMUSCULAR | Status: DC | PRN
Start: 2012-09-16 — End: 2012-09-16

## 2012-09-16 MED ORDER — SUCCINYLCHOLINE CHLORIDE 20 MG/ML IJ SOLN
INTRAMUSCULAR | Status: DC | PRN
  Administered 2012-09-16: 100 mg via INTRAVENOUS

## 2012-09-16 MED ORDER — ACETAMINOPHEN 10 MG/ML IV SOLN
1000.0000 mg | Freq: Four times a day (QID) | INTRAVENOUS | Status: AC
  Administered 2012-09-16 – 2012-09-17 (×4): 1000 mg via INTRAVENOUS
  Filled 2012-09-16 (×4): qty 100

## 2012-09-16 SURGICAL SUPPLY — 49 items
ADH SKN CLS APL DERMABOND .7 (GAUZE/BANDAGES/DRESSINGS) ×1
ATTRACTOMAT 16X20 MAGNETIC DRP (DRAPES) ×2 IMPLANT
BENZOIN TINCTURE PRP APPL 2/3 (GAUZE/BANDAGES/DRESSINGS) ×2 IMPLANT
CANISTER SUCTION 2500CC (MISCELLANEOUS) ×2 IMPLANT
CATH THORACIC 28FR (CATHETERS) IMPLANT
CATH THORACIC 28FR RT ANG (CATHETERS) IMPLANT
CATH THORACIC 36FR (CATHETERS) IMPLANT
CATH THORACIC 36FR RT ANG (CATHETERS) IMPLANT
CLIP TI WIDE RED SMALL 6 (CLIP) ×4 IMPLANT
CLOTH BEACON ORANGE TIMEOUT ST (SAFETY) ×2 IMPLANT
CONN ST 1/4X3/8  BEN (MISCELLANEOUS) ×1
CONN ST 1/4X3/8 BEN (MISCELLANEOUS) ×1 IMPLANT
CONT SPEC 4OZ CLIKSEAL STRL BL (MISCELLANEOUS) ×2 IMPLANT
COVER SURGICAL LIGHT HANDLE (MISCELLANEOUS) ×4 IMPLANT
DERMABOND ADVANCED (GAUZE/BANDAGES/DRESSINGS) ×1
DERMABOND ADVANCED .7 DNX12 (GAUZE/BANDAGES/DRESSINGS) ×1 IMPLANT
DRAIN CHANNEL 28F RND 3/8 FF (WOUND CARE) ×2 IMPLANT
DRAPE CARDIOVASCULAR INCISE (DRAPES) ×2
DRAPE LAPAROSCOPIC ABDOMINAL (DRAPES) IMPLANT
DRAPE SRG 135X102X78XABS (DRAPES) ×1 IMPLANT
ELECT REM PT RETURN 9FT ADLT (ELECTROSURGICAL) ×2
ELECTRODE REM PT RTRN 9FT ADLT (ELECTROSURGICAL) ×1 IMPLANT
GLOVE ORTHO TXT STRL SZ7.5 (GLOVE) ×4 IMPLANT
GOWN PREVENTION PLUS XLARGE (GOWN DISPOSABLE) IMPLANT
HEMOSTAT POWDER SURGIFOAM 1G (HEMOSTASIS) IMPLANT
KIT BASIN OR (CUSTOM PROCEDURE TRAY) ×2 IMPLANT
KIT ROOM TURNOVER OR (KITS) ×2 IMPLANT
NS IRRIG 1000ML POUR BTL (IV SOLUTION) ×2 IMPLANT
PACK CHEST (CUSTOM PROCEDURE TRAY) ×2 IMPLANT
PAD ARMBOARD 7.5X6 YLW CONV (MISCELLANEOUS) ×4 IMPLANT
PAD ELECT DEFIB RADIOL ZOLL (MISCELLANEOUS) ×2 IMPLANT
SPONGE GAUZE 4X4 12PLY (GAUZE/BANDAGES/DRESSINGS) ×2 IMPLANT
STRIP CLOSURE SKIN 1/2X4 (GAUZE/BANDAGES/DRESSINGS) ×2 IMPLANT
SUT MNCRL AB 3-0 PS2 18 (SUTURE) ×2 IMPLANT
SUT PDS AB 1 CTX 36 (SUTURE) ×2 IMPLANT
SUT SILK  1 MH (SUTURE) ×1
SUT SILK 1 MH (SUTURE) ×1 IMPLANT
SUT VIC AB 2-0 CT1 36 (SUTURE) ×2 IMPLANT
SUT VIC AB 2-0 CTX 27 (SUTURE) ×2 IMPLANT
SUT VIC AB 3-0 SH 8-18 (SUTURE) ×8 IMPLANT
SWAB COLLECTION DEVICE MRSA (MISCELLANEOUS) IMPLANT
SYR 50ML SLIP (SYRINGE) IMPLANT
SYSTEM SAHARA CHEST DRAIN ATS (WOUND CARE) IMPLANT
TOWEL OR 17X24 6PK STRL BLUE (TOWEL DISPOSABLE) ×2 IMPLANT
TOWEL OR 17X26 10 PK STRL BLUE (TOWEL DISPOSABLE) ×2 IMPLANT
TRAP SPECIMEN MUCOUS 40CC (MISCELLANEOUS) ×6 IMPLANT
TRAY FOLEY CATH 14FRSI W/METER (CATHETERS) ×2 IMPLANT
TUBE ANAEROBIC SPECIMEN COL (MISCELLANEOUS) IMPLANT
WATER STERILE IRR 1000ML POUR (IV SOLUTION) ×4 IMPLANT

## 2012-09-16 NOTE — Progress Notes (Signed)
  Echocardiogram 2D Echocardiogram limited has been performed.  [name] 09/16/2012, 12:31 PM

## 2012-09-16 NOTE — Progress Notes (Signed)
Pt transferred to cardiac cath lab with oxygen, telemetry, and RN

## 2012-09-16 NOTE — Anesthesia Procedure Notes (Signed)
Procedure Name: Intubation Date/Time: 09/16/2012 4:13 PM Performed by: Wray Kearns A Pre-anesthesia Checklist: Timeout performed, Patient identified, Emergency Drugs available, Suction available and Patient being monitored Patient Re-evaluated:Patient Re-evaluated prior to inductionOxygen Delivery Method: Circle system utilized Preoxygenation: Pre-oxygenation with 100% oxygen Intubation Type: IV induction, Rapid sequence and Cricoid Pressure applied Laryngoscope Size: Mac and 4 Grade View: Grade I Tube type: Subglottic suction tube Tube size: 8.0 mm Number of attempts: 1 Airway Equipment and Method: Stylet Placement Confirmation: ETT inserted through vocal cords under direct vision,  breath sounds checked- equal and bilateral and positive ETCO2 Secured at: 22 cm Tube secured with: Tape Dental Injury: Teeth and Oropharynx as per pre-operative assessment

## 2012-09-16 NOTE — Progress Notes (Signed)
PIV STARTED IN LEFT FOREARM WITH 20G CATHETER, PREVIOUS ATTEMPT BT STAFF RN JUST ABOVE AND TO RIGHT OF SITE, SMALL RAISED AREA NOTED BUT NS FLUSHES PASSED THE AREA WITHOUT DIFFICULTY

## 2012-09-16 NOTE — Progress Notes (Signed)
*  PRELIMINARY RESULTS* Echocardiogram Echocardiogram Transesophageal has been performed.  [name] 09/16/2012, 4:34 PM

## 2012-09-16 NOTE — Progress Notes (Signed)
Stat echo in progress; Dr Shirlee Latch at bedside evaluating pt and echo report; will continue to closely monitor

## 2012-09-16 NOTE — Progress Notes (Addendum)
Patient ID: [name], male   DOB: 12-14-1930, 79 y.o.   MRN: [id number]    SUBJECTIVE: Still chest pain with deep breathing.  Mild tachycardia.  Very short of breath overnight, feels somewhat better this morning but significant orthopnea.    BP 139/84  Pulse 97  Temp(Src) 98.4 F (36.9 C) (Oral)  Resp 20  Ht 5\' 11"  (1.803 m)  Wt 190 lb 0.6 oz (86.2 kg)  BMI 26.52 kg/m2  SpO2 96%  Intake/Output Summary (Last 24 hours) at 09/16/12 4098 Last data filed at 09/16/12 0000  Gross per 24 hour  Intake    880 ml  Output    600 ml  Net    280 ml    PHYSICAL EXAM General: Well developed, well nourished, in no acute distress. Alert and oriented x 3.  Psych:  Good affect, responds appropriately Neck: JVP 10 cm Lungs: Clear bilaterally with no wheezes or rhonci noted.  Heart: RRR with no murmurs noted, soft heart sounds. No rub.  Abdomen: Bowel sounds are present. Soft, non-tender.  Extremities: No lower extremity edema.   LABS: Basic Metabolic Panel:  Recent Labs  11/91/47 0550 09/16/12 0550  NA 138 134*  K 4.5 4.0  CL 102 101  CO2 27 24  GLUCOSE 187* 295*  BUN 19 22  CREATININE 1.18 1.14  CALCIUM 8.7 8.7   CBC:  Recent Labs  09/15/12 0550 09/16/12 0550  WBC 10.0 9.8  HGB 12.3* 12.1*  HCT 37.0* 36.6*  MCV 95.4 95.1  PLT 151 173   Cardiac Enzymes:  Recent Labs  09/13/12 1400 09/13/12 1715 09/13/12 2107 09/14/12 0400  CKTOTAL 32 33 31 253*  CKMB 1.0 1.1 1.2 3.7  TROPONINI <0.30  --   --   --    Fasting Lipid Panel:  Recent Labs  09/13/12 1400  CHOL 91  HDL 37*  LDLCALC 39  TRIG 77  CHOLHDL 2.5    Current Meds: . aspirin EC  650 mg Oral Q8H  . atorvastatin  40 mg Oral q1800  . colchicine  0.6 mg Oral BID  . donepezil  10 mg Oral QHS  . enoxaparin (LOVENOX) injection  40 mg Subcutaneous Daily  . insulin aspart  0-15 Units Subcutaneous TID WC  . insulin aspart  0-5 Units Subcutaneous QHS  . linagliptin  5 mg Oral Daily  . pantoprazole   40 mg Oral Daily  . sertraline  50 mg Oral QHS     ASSESSMENT AND PLAN: 47 male with history of DM, HTN, HLD, Alzheimers, prostate CA, Lung CA admitted 09/13/12 with diffuse ST elevation, chest pain. Emergent cath per Dr. Excell Seltzer with non-obstructive CAD (moderate LAD stenosis). He is felt to have acute pericarditis.  Echo Friday showed preserved EF but moderate-large pericardial effusion.   1. Acute pericarditis/pericardial effusion:  Malignant versus viral-related effusion.  He does have a history of lung cancer.  Still chest pain with deep breathing.  A moderate-large pericardial effusion was seen by echo Friday.  There was concern for early tamponade but thought was to try treatment for pericarditis to see if procedure could be avoided.  He has been on treatment with high dose ASA and colchicine.  He is more short of breath today with JVD and is mildly tachycardic.  I will get a stat echo this morning.  If it looks like tamponade, will arrange pericardiocentesis.   2. CAD: Nonobstructive.  On ASA and statin.    [name]  6/15/20148:38 AM  Echo shows clear tamponade.  Worse than Friday.  Discussed with Dr. Katrinka Blazing, plan for procedure at around 12:30 (he had some breakfast at about 8).  History of lung cancer, will need to send fluid for cytology.   [name] 09/16/2012 9:41 AM

## 2012-09-16 NOTE — Progress Notes (Signed)
Pt transferred to OR holding area for cardiac surgery; consents signed by pt's spouse; pt transfer with oxygen, telemetry, and RN; report to CRNA at bedside

## 2012-09-16 NOTE — CV Procedure (Addendum)
Pericardiocentesis  Indication: Pericardial tamponade  Consent: The procedure and risks of stroke, death, bleeding, infection, pneumothorax, organ laceration, among others were discussed with the patient. We explained the subxiphoid approach. Understanding the approach and risk, the patient decided to proceed with the procedure. Discussion was held in presence of the patient's family . All questions were answered.  Procedure: The subxiphoid region was sterilely prepped and draped in the cath lab with the patient lying on a wedge at 30. We then performed pericardiocentesis utilizing fluoroscopic and electrocardiographic guidance from the left subxiphoid. 1% Xylocaine local infiltration was administered along with 50 mcg of fentanyl x2 prior to proceeding. Using a long pericardial needle we then made 3 passes at the pericardial space. The initial pass was directed laterally towards the left shoulder. A second pass more medially towards the mid clavicular region and the third pass more medially towards the suprasternal notch. Each pass was without return of pericardial fluid or evidence of epicardial injury. No air or blood was obtained  Fluoroscopy of the lungs did not demonstrate evidence of pneumothorax. The patient is clinically stable post procedure.  Conclusion: Failed pericardiocentesis. We were unable to enter the pericardial space with 3 attempts and in different directions.   Plan: Spoke with Dr. Shirlee Latch. Also spoke with Dr.Owen who  will see the patient later today and consider subxiphoid window.

## 2012-09-16 NOTE — Anesthesia Preprocedure Evaluation (Addendum)
Anesthesia Evaluation  Patient identified by MRN, date of birth, ID band Patient awake    Reviewed: Allergy & Precautions, H&P , NPO status , Patient's Chart, lab work & pertinent test results  Airway Mallampati: II TM Distance: >3 FB Neck ROM: Full    Dental no notable dental hx. (+) Edentulous Upper, Edentulous Lower and Dental Advisory Given   Pulmonary  H/o Lung CA s/p Lobectomy   + wheezing      Cardiovascular hypertension, Rhythm:Regular Rate:Normal     Neuro/Psych PSYCHIATRIC DISORDERS Alzheimer's Dz    GI/Hepatic negative GI ROS, Neg liver ROS,   Endo/Other  diabetes, Type 2, Oral Hypoglycemic Agents  Renal/GU negative Renal ROS  negative genitourinary   Musculoskeletal   Abdominal   Peds  Hematology negative hematology ROS (+)   Anesthesia Other Findings   Reproductive/Obstetrics negative OB ROS                         Anesthesia Physical Anesthesia Plan  ASA: III and emergent  Anesthesia Plan: General   Post-op Pain Management:    Induction: Intravenous  Airway Management Planned: Oral ETT  Additional Equipment: Arterial line, CVP, TEE and Ultrasound Guidance Line Placement  Intra-op Plan:   Post-operative Plan: Extubation in OR and Possible Post-op intubation/ventilation  Informed Consent: I have reviewed the patients History and Physical, chart, labs and discussed the procedure including the risks, benefits and alternatives for the proposed anesthesia with the patient or authorized representative who has indicated his/her understanding and acceptance.   Dental advisory given  Plan Discussed with: CRNA  Anesthesia Plan Comments:         Anesthesia Quick Evaluation

## 2012-09-16 NOTE — Progress Notes (Signed)
Came by to evaluate patient. He is not SOB at rest but is SOB with any activity.  Lungs clear.  Heart sounds distant.  He has pulse of 96 with BO of 110 systolic and a 30 mm pulsus paradox.  Have asked Dr. Cornelius Moras To see today to consider pericardial window.   Ethan Palmer MD [facility]

## 2012-09-16 NOTE — Progress Notes (Signed)
Patient sitting on side of bed to urinate, became diaphoretic and SOB on exertion with increased work of breathing.  Decreased breath sounds with few crackles L Base, few inspiratory wheezes RUL.  Heart sounds remain distant and muffled.  + JVD at 30 degrees.  Back to bed with assistance.  Patient O2 sats 97 on 2L/M.  Continue to assess for signs of acute cardiac tamponade.

## 2012-09-16 NOTE — Progress Notes (Signed)
TCTS BRIEF PROGRESS NOTE  Day of Surgery  S/P Procedure(s) (LRB): SUBXYPHOID PERICARDIAL WINDOW (N/A)   Awake in PACU, following commands, looks comfortable NSR w/ stable BP O2 sats 99-100% on 3 L/min via [place] Minimal chest tube output  Plan: D/C A-line and return to step-down.  Routine post op  No pharmacologic anticoagulation due to hemorrhagic pericardial effusion and risk of bleeding  Remainder of care per Cardiology team  [facility] 09/16/2012 5:41 PM

## 2012-09-16 NOTE — Anesthesia Postprocedure Evaluation (Signed)
  Anesthesia Post-op Note  Patient: [name]  Procedure(s) Performed: Procedure(s): SUBXYPHOID PERICARDIAL WINDOW (N/A)  Patient Location: PACU  Anesthesia Type:General  Level of Consciousness: awake  Airway and Oxygen Therapy: Patient Spontanous Breathing and Patient connected to nasal cannula oxygen  Post-op Pain: mild  Post-op Assessment: Post-op Vital signs reviewed, Patient's Cardiovascular Status Stable, Respiratory Function Stable, Patent Airway and No signs of Nausea or vomiting  Post-op Vital Signs: Reviewed and stable  Complications: No apparent anesthesia complications

## 2012-09-16 NOTE — Consult Note (Signed)
[address]       [name] [address]             [phone]          CARDIOTHORACIC SURGERY CONSULTATION REPORT  PCP is Kerby Nora, MD Referring Provider is Lyn Records, III, MD   Reason for consultation:  Pericardial tamponade  HPI:  Patient is a 79 year old male with h/o hypertension, diabetes and mild Alzheimer's dementia and a remote history of lung cancer and prostate cancer but no previous cardiac history. He states that he was in his usual state of health until approximately 6 weeks ago when he developed an upper respiratory tract infection associated with cough, low-grade fever, malaise. He was seen by his primary care physician and initially treated with Z-Pak, but when symptoms persisted he subsequently underwent treatment with oral Levaquin. He states that he improved after this but is cough never completely resolved.  Approximately one week ago he began to develop diffuse pleuritic left-sided chest pain that gradually accelerated in severity and became associated with shortness of breath. Three days ago he presented to his primary care physician where EKG revealed diffuse ST segment elevation worrisome for possible acute myocardial infarction. EMS was activated and the patient was admitted to the hospital where he underwent emergent diagnostic catheterization.  He was found to have moderate nonobstructive coronary artery disease, and on review of the patient's clinical presentation and electrocardiogram that was felt patient had acute pericarditis. An echocardiogram was performed demonstrating normal left ventricular function with moderate to large free-flowing pericardial effusion. Clinically the patient was not felt to be in pericardial tamponade at that time, so he was treated medically. Since then the patient's clinical condition has deteriorated and followup echocardiogram performed this morning confirms the presence of a large pericardial effusion with  clear evidence for early diastolic collapse consistent with pericardial tamponade. The patient was taken to the cath lab by Dr. Katrinka Blazing for attempted pericardiocentesis. This procedure was unsuccessful and aborted. Emergent cardiothoracic surgical consultation was requested.  Past Medical History  Diagnosis Date  . Unspecified essential hypertension   . Alzheimer's disease   . Anxiety states   . Malignant neoplasm of prostate   . Cellulitis and abscess of unspecified site   . Personal history of malignant neoplasm of bronchus and lung   . Squamous cell carcinoma of skin   . Lung cancer, lower lobe 07/25/2011    T1N0 stage I  adenoca left lung resected 01/03/00  . Prostate ca 07/25/2011    Gleason 7  S/P prostatectomy 04/11/01  . Nonmelanoma skin cancer 07/25/2011    Multiple lesions excised face/nose  . History of colonic polyps 07/25/2011  . Type 2 diabetes mellitus     Hgb A1C 7.4% 08/2012    Past Surgical History  Procedure Laterality Date  . Lobectomy  2001    upper left  . Prostatectomy    . Hernia repair    . Tonsillectomy      Family History  Problem Relation Age of Onset  . Dementia Mother   . Diabetes Mother   . Cancer Father     colon  . Cancer Brother     lung    History   Social History  . Marital Status: Married    Spouse Name: N/A    Number of Children: N/A  . Years of Education: N/A   Occupational History  . retired    Social History Main Topics  .  Smoking status: Former Smoker    Quit date: 04/04/1984  . Smokeless tobacco: Former Neurosurgeon     Comment: previously smoked 4 PPD; quit over 30 years  . Alcohol Use: No  . Drug Use: No  . Sexually Active: Not on file   Other Topics Concern  . Not on file   Social History Narrative   Lives in [place], [place]   Regular exercise--no, mowing grass   Diet: fruit and veggies    Prior to Admission medications   Medication Sig Start Date End Date Taking? Authorizing Provider  atorvastatin (LIPITOR) 40 MG  tablet Take 40 mg by mouth daily.   Yes Historical Provider, MD  donepezil (ARICEPT) 10 MG tablet Take 1 tablet (10 mg total) by mouth at bedtime. 05/08/12  Yes [name] Michelle Nasuti, MD  Multiple Vitamins-Minerals (EQL PROTECTAVISION PO) Take 1 tablet by mouth daily.     Yes Historical Provider, MD  Omega-3 Fatty Acids (FISH OIL PO) Take 1 capsule by mouth daily.   Yes Historical Provider, MD  sertraline (ZOLOFT) 50 MG tablet Take 50 mg by mouth daily.   Yes Historical Provider, MD  sitaGLIPtin (JANUVIA) 100 MG tablet Take 1 tablet (100 mg total) by mouth daily. 08/09/12  Yes [name] Michelle Nasuti, MD    Current Facility-Administered Medications  Medication Dose Route Frequency Provider Last Rate Last Dose  . 0.9 %  sodium chloride infusion  1 mL/kg/hr Intravenous Continuous Lyn Records III, MD      . acetaminophen (TYLENOL) tablet 650 mg  650 mg Oral Q4H PRN Tonny Bollman, MD      . acetaminophen (TYLENOL) tablet 650 mg  650 mg Oral Q4H PRN Lyn Records III, MD      . aspirin EC tablet 650 mg  650 mg Oral Q8H Tonny Bollman, MD   650 mg at 09/16/12 0850  . atorvastatin (LIPITOR) tablet 40 mg  40 mg Oral q1800 Tonny Bollman, MD      . cefUROXime (ZINACEF) 1.5 g in dextrose 5 % 50 mL IVPB  1.5 g Intravenous 60 min Pre-Op Purcell Nails, MD      . colchicine tablet 0.6 mg  0.6 mg Oral BID Tonny Bollman, MD   0.6 mg at 09/16/12 1019  . donepezil (ARICEPT) tablet 10 mg  10 mg Oral QHS Tonny Bollman, MD   10 mg at 09/15/12 2209  . insulin aspart (novoLOG) injection 0-15 Units  0-15 Units Subcutaneous TID WC Laurey Morale, MD   8 Units at 09/16/12 [phone]  . insulin aspart (novoLOG) injection 0-5 Units  0-5 Units Subcutaneous QHS Laurey Morale, MD      . linagliptin (TRADJENTA) tablet 5 mg  5 mg Oral Daily Tonny Bollman, MD   5 mg at 09/16/12 1019  . ondansetron (ZOFRAN) injection 4 mg  4 mg Intravenous Q6H PRN Tonny Bollman, MD      . oxyCODONE-acetaminophen (PERCOCET/ROXICET) 5-325 MG per tablet 1-2 tablet   1-2 tablet Oral Q4H PRN Tonny Bollman, MD   2 tablet at 09/16/12 0850  . pantoprazole (PROTONIX) EC tablet 40 mg  40 mg Oral Daily Tonny Bollman, MD   40 mg at 09/16/12 1019  . sertraline (ZOLOFT) tablet 50 mg  50 mg Oral QHS Tonny Bollman, MD   50 mg at 09/15/12 2209    Allergies  Allergen Reactions  . Sulfa Antibiotics Nausea Only      Review of Systems:  Decreased appetite.  Dry non productive cough. +  SOB. + orthopnea. + pleuritic left chest pain.  No BM since admission.     Physical Exam:   BP 152/90  Pulse 86  Temp(Src) 98.4 F (36.9 C) (Oral)  Resp 21  Ht 5\' 11"  (1.803 m)  Wt 86.2 kg (190 lb 0.6 oz)  BMI 26.52 kg/m2  SpO2 98%  General:  acutely ill-appearing  HEENT:  Unremarkable   Neck:   + JVD, no bruits, no adenopathy   Chest:   clear to auscultation, symmetrical breath sounds, no wheezes, no rhonchi   CV:   RRR, no murmur   Abdomen:  soft, non-tender, no masses   Extremities:  warm, well-perfused, pulses diminished  Rectal/GU  Deferred  Neuro:   Grossly non-focal and symmetrical throughout  Skin:   Clean and dry, no rashes, no breakdown  Diagnostic Tests:  Transthoracic Echocardiography  Patient: [name], [name] MR #: [id number] Study Date: 09/16/2012 Gender: M Age: 87 Height: Weight: BSA: Pt. Status: Room: 2918  ADMITTING Tonny Bollman ATTENDING Luiz Iron, Dalton Darin Engels, Dalton cc:  ------------------------------------------------------------ LV EF: 55% - 60%  ------------------------------------------------------------ Study Conclusions  - Left ventricle: The cavity size was normal. Wall thickness was increased in a pattern of mild LVH. Systolic function was normal. The estimated ejection fraction was in the range of 55% to 60%. Wall motion was normal; there were no regional wall motion abnormalities. Doppler parameters are consistent with abnormal left ventricular  relaxation (grade 1 diastolic dysfunction). - Aortic valve: There was no stenosis. - Aorta: Mildly dilated aortic root. Aortic root dimension: 38mm (ED). - Mitral valve: There was > 25% respirophasic variation of mitral E inflow doppler velocity. No significant regurgitation. - Right ventricle: The cavity size was normal. Wall thickness was normal. Systolic function was normal. - Pulmonary arteries: No complete TR doppler jet so unable to estimate PA systolic pressure. - Systemic veins: IVC measured 2.2 cm with some respirophasic variation, suggesting RA pressure 10 mmHg. - Pericardium, extracardiac: There was a large circumferential pericardial effusion with evidence for pericardial tamponade. There was RV early diastolic collapse and > 25% respirophasic variation of mitral E inflow doppler velocity. The IVC was dilated but still had some respirophasic variation. Impressions:  - Large pericardial effusion with pericardial tamponade. Transthoracic echocardiography. M-mode, complete 2D, spectral Doppler, and color Doppler. Patient status: Inpatient.  ------------------------------------------------------------  ------------------------------------------------------------ Left ventricle: The cavity size was normal. Wall thickness was increased in a pattern of mild LVH. Systolic function was normal. The estimated ejection fraction was in the range of 55% to 60%. Wall motion was normal; there were no regional wall motion abnormalities. Doppler parameters are consistent with abnormal left ventricular relaxation (grade 1 diastolic dysfunction).  ------------------------------------------------------------ Aortic valve: Trileaflet. Doppler: There was no stenosis. No regurgitation.  ------------------------------------------------------------ Aorta: Mildly dilated aortic root.  ------------------------------------------------------------ Mitral valve: There was > 25%  respirophasic variation of mitral E inflow doppler velocity. Doppler: There was no evidence for stenosis. No significant regurgitation.  ------------------------------------------------------------ Right ventricle: The cavity size was normal. Wall thickness was normal. Systolic function was normal.  ------------------------------------------------------------ Pulmonic valve: Structurally normal valve. Cusp separation was normal. Doppler: Transvalvular velocity was within the normal range. No regurgitation.  ------------------------------------------------------------ Tricuspid valve: Doppler: No significant regurgitation.  ------------------------------------------------------------ Pulmonary artery: No complete TR doppler jet so unable to estimate PA systolic pressure.  ------------------------------------------------------------ Right atrium: The atrium was normal in size.  ------------------------------------------------------------ Pericardium: There was a large circumferential pericardial effusion with evidence for pericardial tamponade. There was RV  early diastolic collapse and > 25% respirophasic variation of mitral E inflow doppler velocity. The IVC was dilated but still had some respirophasic variation.  ------------------------------------------------------------ Systemic veins: IVC measured 2.2 cm with some respirophasic variation, suggesting RA pressure 10 mmHg.  ------------------------------------------------------------ Post procedure conclusions Ascending Aorta:  - Mildly dilated aortic root.  ------------------------------------------------------------  2D measurements Normal Left ventricle LVID ED, 38.4 mm 43-52 chord, PLAX LVID ES, 25.2 mm 23-38 chord, PLAX FS, chord, 34 % >29 PLAX LVPW, ED 14.1 mm ------ IVS/LVPW 1.12 <1.3 ratio, ED Ventricular septum IVS, ED 15.8 mm ------ Aorta Root diam, 38 mm ------ ED Left atrium AP dim 26 mm  ------ Right ventricle RVID ED, 36 mm 19-38 PLAX  ------------------------------------------------------------ Prepared and Electronically Authenticated by  [name] 2014-06-15T11:44:[id number]    PORTABLE CHEST - 1 VIEW  Comparison: 07/20/2012  Findings: Single view of the chest was obtained. Again noted are  surgical clips in the left hilar region. Cardiac silhouette is  enlarged but may be accentuated by technique. No evidence for  edema or focal airspace disease. Linear density in the left mid  lung could represent atelectasis or scarring.  IMPRESSION: Postsurgical changes without acute findings.  Cardiomegaly. Heart size may be accentuated by the technique.  Original Report Authenticated By: Richarda Overlie, M.D.    Impression:  Likely acute pericarditis with large pericardial effusion and pericardial tamponade. The patient has history of recent upper respiratory tract infection and likely viral illness which initially began approximately 6 weeks ago.     Plan:  We plan to proceed directly to the operating room for emergency subxiphoid pericardial window. I've counseled the patient and his family regarding the indications, risks, and potential benefits of surgery. Alternative treatment strategies been discussed. All their questions been addressed.    Salvatore Decent. Cornelius Moras, MD 09/16/2012 2:57 PM  I spent in excess of 45 minutes of time directly involved in the conduct of this consultation.

## 2012-09-16 NOTE — Op Note (Signed)
CARDIOTHORACIC SURGERY OPERATIVE NOTE  Date of Procedure:   09/16/2012  Preoperative Diagnosis:  Pericardial Effusion with Pericardial Tamponade  Postoperative Diagnosis:  same  Procedure:    Subxyphoid Pericardial Window  Surgeon:    Salvatore Decent. Cornelius Moras, MD  Assistant:    Waynard Reeds  Anesthesia:    Rosezella Florida, MD  Operative Findings:   Large, bloody pericardial effusion     BRIEF CLINICAL NOTE AND INDICATIONS FOR SURGERY  Patient is a 79 year old male with h/o hypertension, diabetes and mild Alzheimer's dementia and a remote history of lung cancer and prostate cancer but no previous cardiac history. He states that he was in his usual state of health until approximately 6 weeks ago when he developed an upper respiratory tract infection associated with cough, low-grade fever, malaise. He was seen by his primary care physician and initially treated with Z-Pak, but when symptoms persisted he subsequently underwent treatment with oral Levaquin. He states that he improved after this but is cough never completely resolved.  Approximately one week ago he began to develop diffuse pleuritic left-sided chest pain that gradually accelerated in severity and became associated with shortness of breath. Three days ago he presented to his primary care physician where EKG revealed diffuse ST segment elevation worrisome for possible acute myocardial infarction. EMS was activated and the patient was admitted to the hospital where he underwent emergent diagnostic catheterization.  He was found to have moderate nonobstructive coronary artery disease, and on review of the patient's clinical presentation and electrocardiogram that was felt patient had acute pericarditis. An echocardiogram was performed demonstrating normal left ventricular function with moderate to large free-flowing pericardial effusion. Clinically the patient was not felt to be in pericardial tamponade at that time, so he was treated  medically. Since then the patient's clinical condition has deteriorated and followup echocardiogram performed this morning confirms the presence of a large pericardial effusion with clear evidence for early diastolic collapse consistent with pericardial tamponade. The patient was taken to the cath lab by Dr. Katrinka Blazing for attempted pericardiocentesis. This procedure was unsuccessful and aborted. Emergent cardiothoracic surgical consultation was requested. The patient has been seen in consultation and counseled at length regarding the indications, risks and potential benefits of surgery.  All questions have been answered, and the patient provides full informed consent for the operation as described.       DETAILS OF THE OPERATIVE PROCEDURE  The patient is brought to the operating room on the above mentioned date and placed in the supine position on the operating table. A radial arterial line and central venous catheter placed by the anesthesia team. Intravenous antibiotics are administered. Pneumatic sequential compression boots are placed on both lower extremities. General endotracheal anesthesia is induced uneventfully. A Foley catheter is placed. The patient's anterior chest abdomen and both groins are prepared and draped in a sterile manner.  A time out procedure is performed. A small vertical incision is made in the midline beginning at the xiphoid process and extending towards the umbilicus. The incision is completed through the subcutaneous tissue and linea alba using electrocautery. The left body of rectus abdominis muscle is retracted in a cephalad direction and the subjacent pre-peritoneal fat and fascia retracted inferiorly until the anterior surface of the pericardium was exposed. A small incision is made in the pericardium. A large amount of bloody fluid is immediately evacuated. Portions of the fluid are trapped to be sent for routine laboratory data, culture, and cytology. A total of 600 mL of  fluid is evacuated. Transesophageal echocardiogram was performed confirming evacuation of the majority of the pericardial effusion. The pericardial space is drained with a single 28 French Bard chest tube placed through a separate stab incision. The abdominal fascia is closed in the midline using interrupted #1 Vicryl suture. Subcutaneous tissues are closed in layers and the skin is closed with a running subcuticular skin closure.  The patient tolerated the procedure well, was extubated in the operating room, and transported to the postanesthesia care unit in stable condition. Estimated blood loss was trivial. There are no intraoperative complications. All sponge instrument and needle counts are verified correct at completion the operation.     Salvatore Decent. Cornelius Moras MD 09/16/2012 5:00 PM

## 2012-09-16 NOTE — Transfer of Care (Signed)
Immediate Anesthesia Transfer of Care Note  Patient: [name]  Procedure(s) Performed: Procedure(s): SUBXYPHOID PERICARDIAL WINDOW (N/A)  Patient Location: PACU  Anesthesia Type:General  Level of Consciousness: oriented, sedated, patient cooperative and responds to stimulation  Airway & Oxygen Therapy: Patient Spontanous Breathing and Patient connected to face mask oxygen  Post-op Assessment: Report given to PACU RN, Post -op Vital signs reviewed and stable, Patient moving all extremities and Patient moving all extremities X 4  Post vital signs: Reviewed and stable  Complications: No apparent anesthesia complications

## 2012-09-16 NOTE — H&P (Addendum)
This is a 79 year old gentleman with a 3 month history of weight loss and anorexia who presented with ST elevation and underwent emergency catheterization. He had noncritical coronary disease. Echocardiography demonstrated a large pericardial effusion. Medical therapy has been started however he has developed progressive dyspnea and clinical instability. Repeat echo demonstrates a very large pericardial effusion, predominantly  periapical and anterior, and perhaps larger than the previous study. Chest x-ray reveals globular cardiomegaly. He has had left partial pneumonectomy but there is no tracheal shift. The patient is on no anticoagulation. The initial pro time and APTT were elevated after receiving heparin. None have been repeated since that time. He has not had anticoagulation for greater than 48 hours. He is on no significant antiplatelet therapy.   On exam the neck veins are elevated. Blood pressure is 150/90 mmHg. respiratory rate 16 and nonlabored. Heart rate is 100 beats per minute.  The patient's abdomen is distended and tympanitic. As mentioned above the heart is midline globular, without pericardial rub, and distant heart sounds. There is no peripheral edema  Both echocardiographic studies have been reviewed.  The procedure and risks of death, bleeding, organ perforation, pneumothorax, death, infection, emergency surgery, among others were discussed in detail with the patient in the presence of his family. He understands the serious nature of this procedure and its goals. He is willing to proceed. Also spoke with CT surgery, Dr. Cornelius Moras, to make him aware that this procedure was being done and if unsuccessful, pericardial window would be the next step.

## 2012-09-17 DIAGNOSIS — I2510 Atherosclerotic heart disease of native coronary artery without angina pectoris: Secondary | ICD-10-CM | POA: Diagnosis present

## 2012-09-17 DIAGNOSIS — I314 Cardiac tamponade: Secondary | ICD-10-CM | POA: Diagnosis not present

## 2012-09-17 LAB — BLOOD GAS, ARTERIAL
Acid-Base Excess: 2.9 mmol/L — ABNORMAL HIGH (ref 0.0–2.0)
Bicarbonate: 27.4 mEq/L — ABNORMAL HIGH (ref 20.0–24.0)
Drawn by: 331761
FIO2: 0.32 %
O2 Saturation: 98.3 %
Patient temperature: 98.6
TCO2: 28.8 mmol/L (ref 0–100)
pCO2 arterial: 46 mmHg — ABNORMAL HIGH (ref 35.0–45.0)
pH, Arterial: 7.393 (ref 7.350–7.450)
pO2, Arterial: 99.2 mmHg (ref 80.0–100.0)

## 2012-09-17 LAB — GLUCOSE, CAPILLARY
Glucose-Capillary: 111 mg/dL — ABNORMAL HIGH (ref 70–99)
Glucose-Capillary: 163 mg/dL — ABNORMAL HIGH (ref 70–99)
Glucose-Capillary: 164 mg/dL — ABNORMAL HIGH (ref 70–99)
Glucose-Capillary: 178 mg/dL — ABNORMAL HIGH (ref 70–99)
Glucose-Capillary: 192 mg/dL — ABNORMAL HIGH (ref 70–99)
Glucose-Capillary: 203 mg/dL — ABNORMAL HIGH (ref 70–99)
Glucose-Capillary: 92 mg/dL (ref 70–99)

## 2012-09-17 LAB — BASIC METABOLIC PANEL
BUN: 22 mg/dL (ref 6–23)
CO2: 27 mEq/L (ref 19–32)
Calcium: 8.3 mg/dL — ABNORMAL LOW (ref 8.4–10.5)
Chloride: 103 mEq/L (ref 96–112)
Creatinine, Ser: 1.12 mg/dL (ref 0.50–1.35)
GFR calc Af Amer: 70 mL/min — ABNORMAL LOW (ref >90)
GFR calc non Af Amer: 61 mL/min — ABNORMAL LOW (ref >90)
Glucose, Bld: 99 mg/dL (ref 70–99)
Potassium: 3.7 mEq/L (ref 3.5–5.1)
Sodium: 139 mEq/L (ref 135–145)

## 2012-09-17 LAB — CBC
HCT: 33.3 % — ABNORMAL LOW (ref 39.0–52.0)
Hemoglobin: 11 g/dL — ABNORMAL LOW (ref 13.0–17.0)
MCH: 31.2 pg (ref 26.0–34.0)
MCHC: 33 g/dL (ref 30.0–36.0)
MCV: 94.3 fL (ref 78.0–100.0)
Platelets: 165 10*3/uL (ref 150–400)
RBC: 3.53 MIL/uL — ABNORMAL LOW (ref 4.22–5.81)
RDW: 13.1 % (ref 11.5–15.5)
WBC: 7.8 10*3/uL (ref 4.0–10.5)

## 2012-09-17 NOTE — Progress Notes (Addendum)
      [address]       [place] [address]             [phone]        CARDIOTHORACIC SURGERY PROGRESS NOTE   R1 Day Post-Op Procedure(s) (LRB): SUBXYPHOID PERICARDIAL WINDOW (N/A)  Subjective: Doing well.  Minimal pain.  Breathing comfortably.  Objective: Vital signs: BP Readings from Last 1 Encounters:  09/17/12 116/55   Pulse Readings from Last 1 Encounters:  09/17/12 88   Resp Readings from Last 1 Encounters:  09/17/12 16   Temp Readings from Last 1 Encounters:  09/17/12 98.9 F (37.2 C) Oral    Hemodynamics:    Physical Exam:  Rhythm:   sinus  Breath sounds: clear  Heart sounds:  RRR  Incisions:  Dressings dry, intact  Abdomen:  soft  Extremities:  warm   Intake/Output from previous day: 06/15 0701 - 06/16 0700 In: 2310 [P.O.:850; I.V.:1110; IV Piggyback:350] Out: 1415 [Urine:1045; Blood:250; Chest Tube:120] Intake/Output this shift:    Lab Results:  Recent Labs  09/16/12 1454 09/17/12 0500  WBC 8.4 7.8  HGB 12.7* 11.0*  HCT 38.7* 33.3*  PLT 194 165   BMET:  Recent Labs  09/16/12 1454 09/17/12 0500  NA 138 139  K 4.2 3.7  CL 102 103  CO2 30 27  GLUCOSE 186* 99  BUN 24* 22  CREATININE 1.15 1.12  CALCIUM 9.1 8.3*    CBG (last 3)   Recent Labs  09/17/12 0001 09/17/12 0357 09/17/12 0740  GLUCAP 164* 92 111*   ABG    Component Value Date/Time   PHART 7.393 09/17/2012 0406   HCO3 27.4* 09/17/2012 0406   TCO2 28.8 09/17/2012 0406   O2SAT 98.3 09/17/2012 0406   CXR: Not done  Assessment/Plan: S/P Procedure(s) (LRB): SUBXYPHOID PERICARDIAL WINDOW (N/A)  Doing well POD1 Minimal chest tube output overnight Will leave chest tube one more day D/C foley and mobilize Advance diet  I suspect pericardial effusion was related to acute pericarditis w/ classic presentation and appearance in OR.  Malignancy less likely, but certainly need to f/u cytology.  No pharmacologic anticoagulation due to hemorrhagic  pericardial effusion and risk of bleeding   Remainder of care per Cardiology team      [facility] H 09/17/2012 9:05 AM

## 2012-09-17 NOTE — Progress Notes (Signed)
    SUBJECTIVE: Breathing is better this am. No complaints.   BP 102/53  Pulse 84  Temp(Src) 98 F (36.7 C) (Oral)  Resp 16  Ht 5\' 11"  (1.803 m)  Wt 191 lb 12.8 oz (87 kg)  BMI 26.76 kg/m2  SpO2 100%  Intake/Output Summary (Last 24 hours) at 09/17/12 0741 Last data filed at 09/17/12 1610  Gross per 24 hour  Intake   2310 ml  Output   1415 ml  Net    895 ml    PHYSICAL EXAM General: Well developed, well nourished, in no acute distress. Alert and oriented x 3.  Psych:  Good affect, responds appropriately Neck: No JVD. No masses noted.  Lungs: Clear bilaterally with no wheezes or rhonci noted.  Heart: RRR with no murmurs noted. Abdomen: Bowel sounds are present. Soft, non-tender.  Extremities: No lower extremity edema.   LABS: Basic Metabolic Panel:  Recent Labs  96/04/54 1454 09/17/12 0500  NA 138 139  K 4.2 3.7  CL 102 103  CO2 30 27  GLUCOSE 186* 99  BUN 24* 22  CREATININE 1.15 1.12  CALCIUM 9.1 8.3*   CBC:  Recent Labs  09/16/12 1454 09/17/12 0500  WBC 8.4 7.8  HGB 12.7* 11.0*  HCT 38.7* 33.3*  MCV 95.8 94.3  PLT 194 165   Current Meds: . acetaminophen  1,000 mg Intravenous Q6H  . atorvastatin  40 mg Oral q1800  . bisacodyl  10 mg Oral Daily  . colchicine  0.6 mg Oral BID  . donepezil  10 mg Oral QHS  . insulin aspart  0-24 Units Subcutaneous Q4H  . pantoprazole  40 mg Oral Daily  . sertraline  50 mg Oral QHS     ASSESSMENT AND PLAN: 17 male with history of DM, HTN, HLD, Alzheimers, prostate CA, Lung CA admitted 09/13/12 with diffuse ST elevation, chest pain. Emergent cath per Dr. Excell Seltzer 09/13/12 with non-obstructive CAD (moderate LAD stenosis). He has since been found to have a large pericardial effusion with tamponade. Now s/p failed pericardiocentesis 09/16/12 followed by emergent pericardial window per Dr. Cornelius Moras 09/16/12. Echo 09/14/12 showed preserved EF.    1. Pericardial effusion: Malignant versus viral-related effusion. Now s/p pericardial  window. He does have a history of lung cancer. Fluid studies are pending including cytology. High suspicion for malignancy. He will need a CT of the chest/abd/pelvis later this week. Will wait until chest tube is out to plan.   2. CAD: Nonobstructive. ASA held post procedure. Continue statin.     [name]  6/16/20147:41 AM

## 2012-09-17 NOTE — Progress Notes (Signed)
Inpatient Diabetes Program Recommendations  AACE/ADA: New Consensus Statement on Inpatient Glycemic Control (2013)  Target Ranges:  Prepandial:   less than 140 mg/dL      Peak postprandial:   less than 180 mg/dL (1-2 hours)      Critically ill patients:  140 - 180 mg/dL    Inpatient Diabetes Program Recommendations Correction (SSI): change to TID since patient is eating HgbA1C: =7.6 Thank you  Piedad Climes BSN, RN,CDE Inpatient Diabetes Coordinator [phone] (team pager)

## 2012-09-18 ENCOUNTER — Encounter (HOSPITAL_COMMUNITY): Payer: Self-pay | Admitting: Thoracic Surgery (Cardiothoracic Vascular Surgery)

## 2012-09-18 LAB — TYPE AND SCREEN
ABO/RH(D): O POS
Antibody Screen: NEGATIVE
Unit division: 0
Unit division: 0
Unit division: 0
Unit division: 0

## 2012-09-18 LAB — URINALYSIS, ROUTINE W REFLEX MICROSCOPIC
Bilirubin Urine: NEGATIVE
Glucose, UA: NEGATIVE mg/dL
Hgb urine dipstick: NEGATIVE
Ketones, ur: NEGATIVE mg/dL
Leukocytes, UA: NEGATIVE
Nitrite: NEGATIVE
Protein, ur: NEGATIVE mg/dL
Specific Gravity, Urine: 1.013 (ref 1.005–1.030)
Urobilinogen, UA: 0.2 mg/dL (ref 0.0–1.0)
pH: 5.5 (ref 5.0–8.0)

## 2012-09-18 LAB — GLUCOSE, CAPILLARY
Glucose-Capillary: 128 mg/dL — ABNORMAL HIGH (ref 70–99)
Glucose-Capillary: 121 mg/dL — ABNORMAL HIGH (ref 70–99)
Glucose-Capillary: 107 mg/dL — ABNORMAL HIGH (ref 70–99)
Glucose-Capillary: 175 mg/dL — ABNORMAL HIGH (ref 70–99)
Glucose-Capillary: 197 mg/dL — ABNORMAL HIGH (ref 70–99)

## 2012-09-18 LAB — COMPREHENSIVE METABOLIC PANEL
ALT: 16 U/L (ref 0–53)
AST: 16 U/L (ref 0–37)
Albumin: 2.3 g/dL — ABNORMAL LOW (ref 3.5–5.2)
Alkaline Phosphatase: 76 U/L (ref 39–117)
BUN: 17 mg/dL (ref 6–23)
CO2: 27 mEq/L (ref 19–32)
Calcium: 8.3 mg/dL — ABNORMAL LOW (ref 8.4–10.5)
Chloride: 101 mEq/L (ref 96–112)
Creatinine, Ser: 1.01 mg/dL (ref 0.50–1.35)
GFR calc Af Amer: 79 mL/min — ABNORMAL LOW (ref >90)
GFR calc non Af Amer: 69 mL/min — ABNORMAL LOW (ref >90)
Glucose, Bld: 140 mg/dL — ABNORMAL HIGH (ref 70–99)
Potassium: 4.1 mEq/L (ref 3.5–5.1)
Sodium: 137 mEq/L (ref 135–145)
Total Bilirubin: 0.3 mg/dL (ref 0.3–1.2)
Total Protein: 5.9 g/dL — ABNORMAL LOW (ref 6.0–8.3)

## 2012-09-18 LAB — CBC
HCT: 34.4 % — ABNORMAL LOW (ref 39.0–52.0)
Hemoglobin: 11.3 g/dL — ABNORMAL LOW (ref 13.0–17.0)
MCH: 31 pg (ref 26.0–34.0)
MCHC: 32.8 g/dL (ref 30.0–36.0)
MCV: 94.5 fL (ref 78.0–100.0)
Platelets: 209 10*3/uL (ref 150–400)
RBC: 3.64 MIL/uL — ABNORMAL LOW (ref 4.22–5.81)
RDW: 12.8 % (ref 11.5–15.5)
WBC: 7.9 10*3/uL (ref 4.0–10.5)

## 2012-09-18 MED ORDER — POLYETHYLENE GLYCOL 3350 17 G PO PACK
17.0000 g | PACK | Freq: Every day | ORAL | Status: DC | PRN
  Administered 2012-09-18 – 2012-09-19 (×2): 17 g via ORAL
  Filled 2012-09-18: qty 1

## 2012-09-18 MED ORDER — BENZONATATE 100 MG PO CAPS
100.0000 mg | ORAL_CAPSULE | Freq: Three times a day (TID) | ORAL | Status: DC | PRN
  Administered 2012-09-18 (×2): 100 mg via ORAL
  Filled 2012-09-18 (×2): qty 1

## 2012-09-18 MED ORDER — INSULIN ASPART 100 UNIT/ML ~~LOC~~ SOLN: 0.0000 [IU] | SUBCUTANEOUS | Status: DC

## 2012-09-18 MED ORDER — INSULIN ASPART 100 UNIT/ML ~~LOC~~ SOLN
0.0000 [IU] | Freq: Three times a day (TID) | SUBCUTANEOUS | Status: DC
  Administered 2012-09-18: 4 [IU] via SUBCUTANEOUS
  Administered 2012-09-19: 2 [IU] via SUBCUTANEOUS
  Administered 2012-09-19: 4 [IU] via SUBCUTANEOUS
  Administered 2012-09-19: 2 [IU] via SUBCUTANEOUS
  Administered 2012-09-20: 4 [IU] via SUBCUTANEOUS
  Administered 2012-09-20: 8 [IU] via SUBCUTANEOUS

## 2012-09-18 NOTE — Progress Notes (Signed)
    SUBJECTIVE: Feels better this am. No chest pain. Breathing is easier.   BP 128/61  Pulse 91  Temp(Src) 98.7 F (37.1 C) (Oral)  Resp 13  Ht 5\' 11"  (1.803 m)  Wt 191 lb 12.8 oz (87 kg)  BMI 26.76 kg/m2  SpO2 94%  Intake/Output Summary (Last 24 hours) at 09/18/12 0724 Last data filed at 09/18/12 0650  Gross per 24 hour  Intake    980 ml  Output   1152 ml  Net   -172 ml    PHYSICAL EXAM General: Well developed, well nourished, in no acute distress. Alert and oriented x 3.  Psych:  Good affect, responds appropriately Neck: No JVD. No masses noted.  Lungs: Clear bilaterally with no wheezes or rhonci noted.  Heart: RRR with no murmurs noted. Abdomen: Bowel sounds are present. Soft, non-tender.  Extremities: No lower extremity edema.   LABS: Basic Metabolic Panel:  Recent Labs  09/81/19 0500 09/18/12 0455  NA 139 137  K 3.7 4.1  CL 103 101  CO2 27 27  GLUCOSE 99 140*  BUN 22 17  CREATININE 1.12 1.01  CALCIUM 8.3* 8.3*   CBC:  Recent Labs  09/17/12 0500 09/18/12 0455  WBC 7.8 7.9  HGB 11.0* 11.3*  HCT 33.3* 34.4*  MCV 94.3 94.5  PLT 165 209   Current Meds: . atorvastatin  40 mg Oral q1800  . bisacodyl  10 mg Oral Daily  . colchicine  0.6 mg Oral BID  . donepezil  10 mg Oral QHS  . insulin aspart  0-24 Units Subcutaneous Q4H  . pantoprazole  40 mg Oral Daily  . sertraline  50 mg Oral QHS     ASSESSMENT AND PLAN: ASSESSMENT AND PLAN: 61 male with history of DM, HTN, HLD, Alzheimers, prostate CA, Lung CA admitted 09/13/12 with diffuse ST elevation, chest pain. Emergent cath per Dr. Excell Seltzer 09/13/12 with non-obstructive CAD (moderate LAD stenosis). He has since been found to have a large pericardial effusion with tamponade. Now s/p failed pericardiocentesis 09/16/12 followed by emergent pericardial window per Dr. Cornelius Moras 09/16/12. Echo 09/14/12 showed preserved EF.   1. Pericardial effusion: Malignant versus viral-related effusion. Now s/p pericardial window. He  does have a history of lung cancer. Cytology is pending. Chest tube is still in place. Will continue colchicine.   2. CAD: Nonobstructive. ASA held post procedure. Continue statin.     Schylar Allard  6/17/20147:24 AM

## 2012-09-18 NOTE — Progress Notes (Addendum)
                   [address]            [place] [address]          [phone]    2 Days Post-Op Procedure(s) (LRB): SUBXYPHOID PERICARDIAL WINDOW (N/A)  Subjective: Patient without complaints this am. His breathing has improved.  Objective: Vital signs in last 24 hours: Temp:  [98 F (36.7 C)-99.8 F (37.7 C)] 98 F (36.7 C) (06/17 0752) Pulse Rate:  [86-102] 86 (06/17 0752) Cardiac Rhythm:  [-] Normal sinus rhythm (06/17 0752) Resp:  [13-28] 28 (06/17 0752) BP: (119-140)/(53-68) 140/68 mmHg (06/17 0752) SpO2:  [93 %-100 %] 100 % (06/17 0752)     Intake/Output from previous day: 06/16 0701 - 06/17 0700 In: 1000 [P.O.:640; I.V.:260; IV Piggyback:100] Out: 1152 [Urine:1100; Chest Tube:52]   Physical Exam:  Cardiovascular: RRR, no murmurs, gallops Pulmonary: Clear to auscultation bilaterally; no rales, wheezes, or rhonchi. Abdomen: Soft, non tender, bowel sounds present. Extremities: No  lower extremity edema. Wound: Clean and dry.  No erythema or signs of infection. Chest Tube: Serosanguinous drainage, to suction  Lab Results: CBC: Recent Labs  09/17/12 0500 09/18/12 0455  WBC 7.8 7.9  HGB 11.0* 11.3*  HCT 33.3* 34.4*  PLT 165 209   BMET:  Recent Labs  09/17/12 0500 09/18/12 0455  NA 139 137  K 3.7 4.1  CL 103 101  CO2 27 27  GLUCOSE 99 140*  BUN 22 17  CREATININE 1.12 1.01  CALCIUM 8.3* 8.3*    PT/INR:  Recent Labs  09/16/12 1454  LABPROT 15.4*  INR 1.24   ABG:  INR: Will add last result for INR, ABG once components are confirmed Will add last 4 CBG results once components are confirmed  Assessment/Plan:  1. CV - SR 2.  Pulmonary - Pericardial tube with 52 cc last 24 hours. Will remove tube. Preliminary gram stain showed WBC (mostly PMN) but no organisms. Final cytology is pending. 3. H nd H stable at 11.3 and 34.4  ZIMMERMAN,DONIELLE MPA-C 09/18/2012,8:51 AM   I have seen and examined the patient and agree  with the assessment and plan as outlined.  OWEN,CLARENCE H 09/18/2012 1:55 PM

## 2012-09-18 NOTE — Progress Notes (Signed)
Called by 2900 RN to remove MT from pt. Order verified prior to MT removal. See docflowsheet charting  [name]

## 2012-09-19 ENCOUNTER — Inpatient Hospital Stay (HOSPITAL_COMMUNITY): Payer: Medicare Other

## 2012-09-19 DIAGNOSIS — I517 Cardiomegaly: Secondary | ICD-10-CM

## 2012-09-19 LAB — GLUCOSE, CAPILLARY
Glucose-Capillary: 135 mg/dL — ABNORMAL HIGH (ref 70–99)
Glucose-Capillary: 166 mg/dL — ABNORMAL HIGH (ref 70–99)
Glucose-Capillary: 160 mg/dL — ABNORMAL HIGH (ref 70–99)
Glucose-Capillary: 219 mg/dL — ABNORMAL HIGH (ref 70–99)

## 2012-09-19 LAB — BASIC METABOLIC PANEL
BUN: 15 mg/dL (ref 6–23)
CO2: 29 mEq/L (ref 19–32)
Calcium: 8.7 mg/dL (ref 8.4–10.5)
Chloride: 101 mEq/L (ref 96–112)
Creatinine, Ser: 0.88 mg/dL (ref 0.50–1.35)
GFR calc Af Amer: >90 mL/min (ref >90)
GFR calc non Af Amer: 80 mL/min — ABNORMAL LOW (ref >90)
Glucose, Bld: 153 mg/dL — ABNORMAL HIGH (ref 70–99)
Potassium: 3.9 mEq/L (ref 3.5–5.1)
Sodium: 137 mEq/L (ref 135–145)

## 2012-09-19 LAB — CBC
HCT: 34.5 % — ABNORMAL LOW (ref 39.0–52.0)
Hemoglobin: 11.4 g/dL — ABNORMAL LOW (ref 13.0–17.0)
MCH: 31 pg (ref 26.0–34.0)
MCHC: 33 g/dL (ref 30.0–36.0)
MCV: 93.8 fL (ref 78.0–100.0)
Platelets: 218 10*3/uL (ref 150–400)
RBC: 3.68 MIL/uL — ABNORMAL LOW (ref 4.22–5.81)
RDW: 12.9 % (ref 11.5–15.5)
WBC: 7 10*3/uL (ref 4.0–10.5)

## 2012-09-19 NOTE — Progress Notes (Signed)
    SUBJECTIVE: Feels great this am. No chest pain or SOB.   BP 138/62  Pulse 99  Temp(Src) 98.4 F (36.9 C) (Oral)  Resp 20  Ht 5\' 11"  (1.803 m)  Wt 197 lb 12 oz (89.7 kg)  BMI 27.59 kg/m2  SpO2 96%  Intake/Output Summary (Last 24 hours) at 09/19/12 0900 Last data filed at 09/19/12 0857  Gross per 24 hour  Intake    680 ml  Output   2150 ml  Net  -1470 ml    PHYSICAL EXAM General: Well developed, well nourished, in no acute distress. Alert and oriented x 3.  Psych:  Good affect, responds appropriately Neck: No JVD. No masses noted.  Lungs: Clear bilaterally with no wheezes or rhonci noted.  Heart: RRR with no murmurs noted. Abdomen: Bowel sounds are present. Soft, non-tender.  Extremities: No lower extremity edema.   LABS: Basic Metabolic Panel:  Recent Labs  11/91/47 0455 09/19/12 0435  NA 137 137  K 4.1 3.9  CL 101 101  CO2 27 29  GLUCOSE 140* 153*  BUN 17 15  CREATININE 1.01 0.88  CALCIUM 8.3* 8.7   CBC:  Recent Labs  09/18/12 0455 09/19/12 0435  WBC 7.9 7.0  HGB 11.3* 11.4*  HCT 34.4* 34.5*  MCV 94.5 93.8  PLT 209 218   Current Meds: . atorvastatin  40 mg Oral q1800  . bisacodyl  10 mg Oral Daily  . colchicine  0.6 mg Oral BID  . donepezil  10 mg Oral QHS  . insulin aspart  0-24 Units Subcutaneous TID WC  . pantoprazole  40 mg Oral Daily  . sertraline  50 mg Oral QHS     ASSESSMENT AND PLAN: 41 male with history of DM, HTN, HLD, Alzheimers, prostate CA, Lung CA admitted 09/13/12 with diffuse ST elevation, chest pain. Emergent cath per Dr. Excell Seltzer 09/13/12 with non-obstructive CAD (moderate LAD stenosis). He has since been found to have a large pericardial effusion with tamponade. Now s/p failed pericardiocentesis 09/16/12 followed by emergent pericardial window per Dr. Cornelius Moras 09/16/12. Echo 09/14/12 showed preserved EF.   1. Pericardial effusion: Pericardial biopsy and fluid are negative for malignancy. Pericardial effusion likely inflammatory. He  is s/p pericardial window. Chest tube removed yesterday. Will continue colchicine. Will order limited echo today to assess for resolution of pericardial fluid.   2. CAD: Nonobstructive. ASA held post procedure. Continue statin.   Transfer to telemetry unit.   Cataleia Gade  6/18/20149:00 AM

## 2012-09-19 NOTE — Progress Notes (Signed)
  Echocardiogram 2D Echocardiogram limited has been performed by Arvil Chaco.  [name] [name] 09/19/2012, 4:10 PM

## 2012-09-19 NOTE — Progress Notes (Addendum)
                   [address]            [place] [address]          [phone]    3 Days Post-Op Procedure(s) (LRB): SUBXYPHOID PERICARDIAL WINDOW (N/A)  Subjective: Patient without complaints this am. He just had a bowel movement  Objective: Vital signs in last 24 hours: Temp:  [97.7 F (36.5 C)-99.4 F (37.4 C)] 98.4 F (36.9 C) (06/18 0801) Pulse Rate:  [29-99] 29 (06/18 1229) Cardiac Rhythm:  [-] Normal sinus rhythm (06/18 1214) Resp:  [17-23] 23 (06/18 1229) BP: (137-172)/(62-89) 141/74 mmHg (06/18 1229) SpO2:  [88 %-98 %] 88 % (06/18 1229) Weight:  [89.7 kg (197 lb 12 oz)] 89.7 kg (197 lb 12 oz) (06/18 0600)     Intake/Output from previous day: 06/17 0701 - 06/18 0700 In: 960 [P.O.:820; I.V.:140] Out: 950 [Urine:950]   Physical Exam:  Cardiovascular: RRR, no murmurs, gallops Pulmonary: Clear to auscultation bilaterally; no rales, wheezes, or rhonchi. Abdomen: Soft, non tender, bowel sounds present. Extremities: No  lower extremity edema. Wound: Clean and dry.  No erythema or signs of infection. Chest Tube: Serosanguinous drainage, to suction  Lab Results: CBC:  Recent Labs  09/18/12 0455 09/19/12 0435  WBC 7.9 7.0  HGB 11.3* 11.4*  HCT 34.4* 34.5*  PLT 209 218   BMET:   Recent Labs  09/18/12 0455 09/19/12 0435  NA 137 137  K 4.1 3.9  CL 101 101  CO2 27 29  GLUCOSE 140* 153*  BUN 17 15  CREATININE 1.01 0.88  CALCIUM 8.3* 8.7    PT/INR:   Recent Labs  09/16/12 1454  LABPROT 15.4*  INR 1.24   ABG:  INR: Will add last result for INR, ABG once components are confirmed Will add last 4 CBG results once components are confirmed  Assessment/Plan:  1. CV - SR. Echo to be done today 2.  Pulmonary - Pericardial tube removed yesterday. CXR today shows no pneumothorax, nearly resolved pulmonary edema, small bilateral pleural effusions.Preliminary gram stain showed WBC (mostly PMN) but no organisms. Final cytology is  pending. 3. H nd H stable at 11.4 and 34.5 4.Management per cardiology  ZIMMERMAN,DONIELLE MPA-C 09/19/2012,1:41 PM    I have seen and examined the patient and agree with the assessment and plan as outlined.  Zamya Culhane H 09/19/2012 10:50 PM

## 2012-09-20 ENCOUNTER — Encounter (HOSPITAL_COMMUNITY): Payer: Self-pay | Admitting: Nurse Practitioner

## 2012-09-20 DIAGNOSIS — I309 Acute pericarditis, unspecified: Secondary | ICD-10-CM

## 2012-09-20 LAB — GLUCOSE, CAPILLARY
Glucose-Capillary: 172 mg/dL — ABNORMAL HIGH (ref 70–99)
Glucose-Capillary: 248 mg/dL — ABNORMAL HIGH (ref 70–99)

## 2012-09-20 LAB — CBC
HCT: 35.2 % — ABNORMAL LOW (ref 39.0–52.0)
Hemoglobin: 11.6 g/dL — ABNORMAL LOW (ref 13.0–17.0)
MCH: 31.1 pg (ref 26.0–34.0)
MCHC: 33 g/dL (ref 30.0–36.0)
MCV: 94.4 fL (ref 78.0–100.0)
Platelets: 244 10*3/uL (ref 150–400)
RBC: 3.73 MIL/uL — ABNORMAL LOW (ref 4.22–5.81)
RDW: 13 % (ref 11.5–15.5)
WBC: 7 10*3/uL (ref 4.0–10.5)

## 2012-09-20 LAB — BASIC METABOLIC PANEL
BUN: 14 mg/dL (ref 6–23)
CO2: 32 mEq/L (ref 19–32)
Calcium: 8.9 mg/dL (ref 8.4–10.5)
Chloride: 99 mEq/L (ref 96–112)
Creatinine, Ser: 1.11 mg/dL (ref 0.50–1.35)
GFR calc Af Amer: 71 mL/min — ABNORMAL LOW (ref >90)
GFR calc non Af Amer: 61 mL/min — ABNORMAL LOW (ref >90)
Glucose, Bld: 170 mg/dL — ABNORMAL HIGH (ref 70–99)
Potassium: 4.7 mEq/L (ref 3.5–5.1)
Sodium: 136 mEq/L (ref 135–145)

## 2012-09-20 LAB — BODY FLUID CULTURE: Culture: NO GROWTH

## 2012-09-20 MED ORDER — PANTOPRAZOLE SODIUM 40 MG PO TBEC: 40.0000 mg | DELAYED_RELEASE_TABLET | Freq: Every day | ORAL | Status: DC

## 2012-09-20 MED ORDER — BENZONATATE 100 MG PO CAPS: 100.0000 mg | ORAL_CAPSULE | Freq: Three times a day (TID) | ORAL | Status: DC | PRN

## 2012-09-20 MED ORDER — BISACODYL 5 MG PO TBEC: 10.0000 mg | DELAYED_RELEASE_TABLET | Freq: Every day | ORAL | Status: DC

## 2012-09-20 MED ORDER — COLCHICINE 0.6 MG PO TABS: 0.6000 mg | ORAL_TABLET | Freq: Two times a day (BID) | ORAL | Status: DC

## 2012-09-20 NOTE — Progress Notes (Signed)
Discharge instructions reviewed with patient and wife, including F/U appointments, prescription medications, diet, and incision care.Teach-back method utilized with success.  CT suture removed.  Denies pain at time of discharge.  Pt. Discharged to private home with wife accompanied by family.  Escorted to exit via wheelchair by nurse tech.  Please refer to exit care for educational materials utilized.

## 2012-09-20 NOTE — Discharge Summary (Signed)
Patient ID: [name],  MRN: [id number], DOB/AGE: 77-Sep-1932 77 y.o.  Admit date: 09/13/2012 Discharge date: 09/20/2012  Primary Care Provider: Kerby Nora Primary Cardiologist: Judie Petit. Excell Seltzer, MD   Discharge Diagnoses Principal Problem:   Acute pericarditis, unspecified Active Problems:   Pericardial effusion, acute     Pericardial tamponade  **s/p pericardial window this admission.   Coronary atherosclerosis of native coronary artery  **nonobstructive dzs on cath this admission.   DIABETES MELLITUS   HYPERCHOLESTEROLEMIA   ALZHEIMER'S DISEASE, MILD   HYPERTENSION  Allergies Allergies  Allergen Reactions  . Sulfa Antibiotics Nausea Only    Procedures  Cardiac Catheterization 6.12.2014  Procedural Findings: Hemodynamics: AO 91/61 with a mean of 73 LV 85/17  Coronary angiography: Coronary dominance: right  Left mainstem: The left main is widely patent. There is minor irregularity.  Left anterior descending (LAD): The LAD is very large in caliber. There is diffuse irregularity present. The proximal LAD has 50-60% stenosis. At the origin of the first diagonal branch there is an eccentric 60-70% stenosis. The mid and distal LAD are free of significant disease. The first diagonal has 50% ostial stenosis.  Left circumflex (LCx): The left circumflex is moderate to large in caliber. There are no significant stenoses present. The vessel gives off a large caliber single obtuse marginal with no significant disease. There is a small intermediate branch with no significant disease.  Right coronary artery (RCA): The RCA is very large in caliber. There is diffuse irregularity but no significant stenoses. The PDA and PLA branches are patent.  Left ventriculography: Left ventricular systolic function is normal, LVEF is estimated at 55-65%, there is no significant mitral regurgitation   Final Conclusions:   1. Moderate mid LAD stenosis involving the first diagonal branch. 2. Minor  nonobstructive stenosis of the left circumflex and right coronary arteries. 3. Normal left ventricular systolic function  Recommendations: The patient's clinical syndrome is consistent with acute pericarditis. We will treat him with high-dose aspirin and colchicine. An echocardiogram will be done. Consider noninvasive evaluation for his LAD stenosis after he recovers. _____________  2D Echocardiograms  6.13.2014 Study Conclusions  - Left ventricle: The cavity size was normal. Wall thickness was increased in a pattern of mild LVH. Systolic function was normal. The estimated ejection fraction was in the range of 60% to 65%. Wall motion was normal; there were no regional wall motion abnormalities. Doppler parameters are consistent with abnormal left ventricular relaxation (grade 1 diastolic dysfunction). - Aortic valve: Mild regurgitation. - Pericardium, extracardiac: A moderate to large, free-flowing pericardial effusion was identified circumferential to the heart. The fluid had no internal echoes. _____________  6.15.2014 Study Conclusions  - Left ventricle: The cavity size was normal. Wall thickness   was increased in a pattern of mild LVH. Systolic function   was normal. The estimated ejection fraction was in the   range of 55% to 60%. Wall motion was normal; there were no   regional wall motion abnormalities. Doppler parameters are   consistent with abnormal left ventricular relaxation   (grade 1 diastolic dysfunction). - Aortic valve: There was no stenosis. - Aorta: Mildly dilated aortic root. Aortic root dimension:   38mm (ED). - Mitral valve: There was > 25% respirophasic variation of   mitral E inflow doppler velocity. No significant   regurgitation. - Right ventricle: The cavity size was normal. Wall   thickness was normal. Systolic function was normal. - Pulmonary arteries: No complete TR doppler jet so unable   to estimate  PA systolic pressure. - Systemic veins:  IVC measured 2.2 cm with some   respirophasic variation, suggesting RA pressure 10 mmHg. - Pericardium, extracardiac: There was a large   circumferential pericardial effusion with evidence for   pericardial tamponade. There was RV early diastolic   collapse and > 25% respirophasic variation of mitral E   inflow doppler velocity. The IVC was dilated but still had   some respirophasic variation. Impressions:  - Large pericardial effusion with pericardial tamponade. Transthoracic echocardiography.  M-mode, complete 2D, spectral Doppler, and color Doppler.  Patient status: Inpatient. _____________  Pericardial Window 6.18.2014  600 ml of fluid evacuated and 28 French Bard chest tube placed into pericardial space.  Pericardium, biopsy - BENIGN FIBROUS SOFT TISSUE WITH MILD ASSOCIATED CHRONIC INFLAMMATION. - NO TUMOR SEEN. ____________   6.18.2014 Study Conclusions  - Left ventricle: The cavity size was normal. Wall thickness   was increased in a pattern of moderate LVH. The estimated   ejection fraction was 55%. Doppler parameters are   consistent with abnormal left ventricular relaxation   (grade 1 diastolic dysfunction). - Ventricular septum: There is some respirophasic shift of   the interventricular septum, suggesting a degree of   ventricular interdependence. - Mitral valve: < 25% respirophasic variation of mitral E   inflow velocity. Trivial regurgitation. - Right ventricle: The cavity size was mildly dilated.   Systolic function was normal. - Inferior vena cava: The vessel was normal in size; the   respirophasic diameter changes were in the normal range (=   50%); findings are consistent with normal central venous   pressure. - Pericardium, extracardiac: There was no pericardial   effusion. Impressions:  - Limited echo done to assess for pericardial effusion. I do   not see a significant residual effusion. There does appear   to be some residual ventricular  interdependence as seen by   mild respirophasic shifting of the interventricular   septum. _____________  History of Present Illness  79 year old male without prior cardiac history who was in his usual state of health until approximately 2-3 days prior to admission when he began to expand sharp substernal chest pain that was worse with inspiration and supine positioning. He thought this maybe related to straining a muscle. Symptoms worsened over the subsequent 2-3 days prompting him to present to his primary care provider on June 12 where ECG was performed and showed Q waves in V1, V2, and ST segment elevation in 1 and aVL. EMS was activated and upon their arrival repeat ECG also showed ST elevation in inferior leads. A code STEMI was called and the patient was taken emergently to the [facility] cone cath lab.  Hospital Course  Patient underwent emergent diagnostic cardiac catheterization revealing moderate nonobstructive coronary artery disease as outlined above. LV function was normal. Was felt that his presentation was most likely secondary to acute pericarditis and he was admitted to the coronary intensive care unit and placed on high-dose aspirin and colchicine therapy. Followup cardiac markers were negative. He continued to have a pleuritic-type chest discomfort a 2-D echocardiogram was carried out on June 13, which showed normal LV function and a moderate to large circumferential free-flowing pericardial effusion. Patient was hemodynamically stable and plans were made for followup echocardiogram provided that he remains stable. On June 15, he expressed more dyspnea as well as mild tachycardia. Repeat echocardiogram again showed a large pericardial effusion however this time it did not physiology was present. Interventional cardiology was consulted and an attempt was  made at pericardiocentesis however was unsuccessful. At that point, thoracic surgery was consulted and decision was made to pursue a  pericardial window. This was carried out in the operating room on June 15, with removal of 600 mL of fluid. A pericardial tube was then placed and the patient returned to the intensive care unit. He remained stable and pericardial tube was able to be removed on June 17. In the setting of recent pericardial window, aspirin was discontinued and he has been maintained on colchicine therapy. Pericardial biopsy revealed no evidence of tumor with benign fibrous, soft tissue with mild associated chronic inflammation. Final culture is pending.  The patient has been stable since removal of the pericardial drain without recurrence of chest pain. He'll be discharged home in good condition and will followup with his thoracic surgery and cardiology within the next 2-3 weeks.  Discharge Vitals Blood pressure 131/71, pulse 95, temperature 98.3 F (36.8 C), temperature source Oral, resp. rate 18, height 5\' 11"  (1.803 m), weight 190 lb 1.6 oz (86.229 kg), SpO2 97.00%.  Filed Weights   09/17/12 0359 09/19/12 0600 09/20/12 0454  Weight: 191 lb 12.8 oz (87 kg) 197 lb 12 oz (89.7 kg) 190 lb 1.6 oz (86.229 kg)   Labs  CBC  Recent Labs  09/19/12 0435 09/20/12 0605  WBC 7.0 7.0  HGB 11.4* 11.6*  HCT 34.5* 35.2*  MCV 93.8 94.4  PLT 218 244   Basic Metabolic Panel  Recent Labs  09/19/12 0435 09/20/12 0605  NA 137 136  K 3.9 4.7  CL 101 99  CO2 29 32  GLUCOSE 153* 170*  BUN 15 14  CREATININE 0.88 1.11  CALCIUM 8.7 8.9   Liver Function Tests  Recent Labs  09/18/12 0455  AST 16  ALT 16  ALKPHOS 76  BILITOT 0.3  PROT 5.9*  ALBUMIN 2.3*   Cardiac Enzymes Lab Results  Component Value Date   CKTOTAL 253* 09/14/2012   CKMB 3.7 09/14/2012   TROPONINI <0.30 09/13/2012    Fasting Lipid Panel Lab Results  Component Value Date   CHOL 91 09/13/2012   HDL 37* 09/13/2012   LDLCALC 39 09/13/2012   TRIG 77 09/13/2012   CHOLHDL 2.5 09/13/2012   Disposition  Pt is being discharged home today in good  condition.  Follow-up Plans & Appointments  Follow-up Information   Follow up with Purcell Nails, MD. (Nurse to remove chest tube suture 09/27/2012 at 10:00 am)    Contact information:   [address] [employer] [address] [place] [address] [phone]       Follow up with Purcell Nails, MD. (PA/LAT CXR to be taken (at [facility] which is in the same building as Dr. Orvan July office) on 10/09/2012 at 2:30 pm;Appointment with Dr. Cornelius Moras is on 10/09/2012 t 3:30 pm)    Contact information:   [address] [employer] [address] [place] [address] [phone]       Follow up with Tereso Newcomer, PA-C On 10/11/2012. (11:50 AM - Dr. Earmon Phoenix PA)    Contact information:   [address] [place] [address] [phone]       Discharge Medications    Medication List    TAKE these medications       atorvastatin 40 MG tablet  Commonly known as:  LIPITOR  Take 40 mg by mouth daily.     benzonatate 100 MG capsule  Commonly known as:  TESSALON  Take 1 capsule (100 mg total) by mouth  3 (three) times daily as needed for cough.     bisacodyl 5 MG EC tablet  Commonly known as:  DULCOLAX  Take 2 tablets (10 mg total) by mouth daily.     colchicine 0.6 MG tablet  Take 1 tablet (0.6 mg total) by mouth 2 (two) times daily.     donepezil 10 MG tablet  Commonly known as:  ARICEPT  Take 1 tablet (10 mg total) by mouth at bedtime.     EQL PROTECTAVISION PO  Take 1 tablet by mouth daily.     FISH OIL PO  Take 1 capsule by mouth daily.     pantoprazole 40 MG tablet  Commonly known as:  PROTONIX  Take 1 tablet (40 mg total) by mouth daily.     sertraline 50 MG tablet  Commonly known as:  ZOLOFT  Take 50 mg by mouth daily.     sitaGLIPtin 100 MG tablet  Commonly known as:  JANUVIA  Take 1 tablet (100 mg total) by mouth daily.        Outstanding Labs/Studies  None  Duration of Discharge Encounter   Greater than 30 minutes including physician  time.  Signed, Nicolasa Ducking NP 09/20/2012, 3:26 PM

## 2012-09-20 NOTE — Progress Notes (Signed)
Pt has a rash of scattered pimple like rash and redness on back.Pt wife states that this is new for him pt does not have any itching and asymptomatic will continue to monitor Ilean Skill LPN

## 2012-09-20 NOTE — Progress Notes (Signed)
SUBJECTIVE: No complaints this am.   BP 162/89  Pulse 105  Temp(Src) 98.6 F (37 C) (Oral)  Resp 22  Ht 5\' 11"  (1.803 m)  Wt 190 lb 1.6 oz (86.229 kg)  BMI 26.53 kg/m2  SpO2 95%  Intake/Output Summary (Last 24 hours) at 09/20/12 0723 Last data filed at 09/19/12 1200  Gross per 24 hour  Intake    240 ml  Output   1600 ml  Net  -1360 ml    PHYSICAL EXAM General: Well developed, well nourished, in no acute distress. Alert and oriented x 3.  Psych:  Good affect, responds appropriately Neck: No JVD. No masses noted.  Lungs: Clear bilaterally with no wheezes or rhonci noted.  Heart: RRR with no murmurs noted. Abdomen: Bowel sounds are present. Soft, non-tender.  Extremities: No lower extremity edema.   LABS: Basic Metabolic Panel:  Recent Labs  16/10/96 0455 09/19/12 0435  NA 137 137  K 4.1 3.9  CL 101 101  CO2 27 29  GLUCOSE 140* 153*  BUN 17 15  CREATININE 1.01 0.88  CALCIUM 8.3* 8.7   CBC:  Recent Labs  09/19/12 0435 09/20/12 0605  WBC 7.0 7.0  HGB 11.4* 11.6*  HCT 34.5* 35.2*  MCV 93.8 94.4  PLT 218 244   Current Meds: . atorvastatin  40 mg Oral q1800  . bisacodyl  10 mg Oral Daily  . colchicine  0.6 mg Oral BID  . donepezil  10 mg Oral QHS  . insulin aspart  0-24 Units Subcutaneous TID WC  . pantoprazole  40 mg Oral Daily  . sertraline  50 mg Oral QHS   Echo 09/19/12: Left ventricle: The cavity size was normal. Wall thickness was increased in a pattern of moderate LVH. The estimated ejection fraction was 55%. Doppler parameters are consistent with abnormal left ventricular relaxation (grade 1 diastolic dysfunction). - Ventricular septum: There is some respirophasic shift of the interventricular septum, suggesting a degree of ventricular interdependence. - Mitral valve: < 25% respirophasic variation of mitral E inflow velocity. Trivial regurgitation. - Right ventricle: The cavity size was mildly dilated. Systolic function was normal. -  Inferior vena cava: The vessel was normal in size; the respirophasic diameter changes were in the normal range (= 50%); findings are consistent with normal central venous pressure. - Pericardium, extracardiac: There was no pericardial effusion. Impressions:  - Limited echo done to assess for pericardial effusion. I do not see a significant residual effusion. There does appear to be some residual ventricular interdependence as seen by mild respirophasic shifting of the interventricular septum.    ASSESSMENT AND PLAN: 20 male with history of DM, HTN, HLD, Alzheimers, prostate CA, Lung CA admitted 09/13/12 with diffuse ST elevation, chest pain. Emergent cath per Dr. Excell Seltzer 09/13/12 with non-obstructive CAD (moderate LAD stenosis). He has since been found to have a large pericardial effusion with tamponade. Now s/p failed pericardiocentesis 09/16/12 followed by emergent pericardial window per Dr. Cornelius Moras 09/16/12. Echo 09/14/12 showed preserved EF.   1. Pericardial effusion: Pericardial biopsy and fluid are negative for malignancy. Pericardial effusion likely inflammatory. He is s/p pericardial window. Chest tube removed 09/18/12. Will continue colchicine.  Echo yesterday with resolution of effusion. Will ask CT surgery to make recs regarding wound care, d/c timing.   2. CAD: Nonobstructive. ASA held post procedure. Continue statin. Will resume ASA if ok with surgery.   3. HTN: He is on a BP med at home. Wife not sure what. She will check  with pharmacy.   Probable d/c later today if ok with surgery. Follow up with Dr. Excell Seltzer in 3-4 weeks.       Sira Adsit  6/19/20147:23 AM

## 2012-09-20 NOTE — Progress Notes (Addendum)
      [address]       [place] [address]             [phone]       4 Days Post-Op Procedure(s) (LRB): SUBXYPHOID PERICARDIAL WINDOW (N/A)  Subjective: Patient without complaints this am.  Objective: Vital signs in last 24 hours: Temp:  [98 F (36.7 C)-98.6 F (37 C)] 98.6 F (37 C) (06/19 0454) Pulse Rate:  [29-105] 105 (06/19 0454) Cardiac Rhythm:  [-] Sinus tachycardia (06/18 2130) Resp:  [17-23] 22 (06/19 0454) BP: (122-172)/(70-89) 162/89 mmHg (06/19 0454) SpO2:  [88 %-95 %] 95 % (06/19 0454) Weight:  [86.229 kg (190 lb 1.6 oz)] 86.229 kg (190 lb 1.6 oz) (06/19 0454)     Intake/Output from previous day: 06/18 0701 - 06/19 0700 In: 240 [P.O.:240] Out: 1600 [Urine:1600]   Physical Exam:  Cardiovascular: Slightly tach, no murmurs, gallops Pulmonary: Clear to auscultation bilaterally; no rales, wheezes, or rhonchi. Abdomen: Soft, non tender, bowel sounds present. Extremities: No  lower extremity edema. Wound: Clean and dry.  No erythema or signs of infection.   Lab Results: CBC:  Recent Labs  09/19/12 0435 09/20/12 0605  WBC 7.0 7.0  HGB 11.4* 11.6*  HCT 34.5* 35.2*  PLT 218 244   BMET:   Recent Labs  09/19/12 0435 09/20/12 0605  NA 137 136  K 3.9 4.7  CL 101 99  CO2 29 32  GLUCOSE 153* 170*  BUN 15 14  CREATININE 0.88 1.11  CALCIUM 8.7 8.9    PT/INR:  No results found for this basename: LABPROT, INR,  in the last 72 hours ABG:  INR: Will add last result for INR, ABG once components are confirmed Will add last 4 CBG results once components are confirmed  Assessment/Plan:  1. CV - ST. Echo to be done yesterday. Results noted-no pericardial effusion. Pericardium, biopsy - BENIGN FIBROUS SOFT TISSUE WITH MILD ASSOCIATED CHRONIC INFLAMMATION. - NO TUMOR SEEN.Preliminary gram stain showed WBC (mostly PMN) but no organisms. Final culutre is pending. 3. H nd H stable at 11.6 and 35.2 4.May discharge when ok with  cardiology 5.Follow up appointment made with Dr. Nino Glow MPA-C 09/20/2012,9:14 AM     I have seen and examined the patient and agree with the assessment and plan as outlined.  Ready for d/c home from surgical standpoint.    Devesh Monforte H 09/20/2012 11:29 AM

## 2012-09-21 NOTE — Discharge Summary (Signed)
See full note.cdm 

## 2012-09-24 ENCOUNTER — Other Ambulatory Visit: Payer: Self-pay | Admitting: *Deleted

## 2012-09-24 LAB — GLUCOSE, CAPILLARY: Glucose-Capillary: 168 mg/dL — ABNORMAL HIGH (ref 70–99)

## 2012-09-25 ENCOUNTER — Encounter (INDEPENDENT_AMBULATORY_CARE_PROVIDER_SITE_OTHER): Payer: Medicare Other | Admitting: Ophthalmology

## 2012-09-25 DIAGNOSIS — E1165 Type 2 diabetes mellitus with hyperglycemia: Secondary | ICD-10-CM

## 2012-09-25 DIAGNOSIS — H43819 Vitreous degeneration, unspecified eye: Secondary | ICD-10-CM

## 2012-09-25 DIAGNOSIS — E11319 Type 2 diabetes mellitus with unspecified diabetic retinopathy without macular edema: Secondary | ICD-10-CM

## 2012-09-25 DIAGNOSIS — H353290 Exudative age-related macular degeneration, unspecified eye, stage unspecified: Secondary | ICD-10-CM

## 2012-09-25 DIAGNOSIS — E1139 Type 2 diabetes mellitus with other diabetic ophthalmic complication: Secondary | ICD-10-CM

## 2012-09-25 DIAGNOSIS — H3530 Unspecified macular degeneration: Secondary | ICD-10-CM

## 2012-09-27 ENCOUNTER — Ambulatory Visit: Payer: Self-pay

## 2012-09-27 DIAGNOSIS — Z09 Encounter for follow-up examination after completed treatment for conditions other than malignant neoplasm: Secondary | ICD-10-CM

## 2012-09-27 DIAGNOSIS — I313 Pericardial effusion (noninflammatory): Secondary | ICD-10-CM

## 2012-09-27 DIAGNOSIS — I309 Acute pericarditis, unspecified: Secondary | ICD-10-CM

## 2012-10-01 NOTE — Progress Notes (Unsigned)
[name] returns supposedly to have suture removal sp subxyphoid window.  On exam, there is a well healed vertical incision and small previous chest tube site.  There are no sutures remaining.  He does complain of residul gout symptoms of his R toe that began in the hospital.  Dr. Tyrone Sage saw, examined and dscussed the issue with him and his son.

## 2012-10-08 ENCOUNTER — Ambulatory Visit (INDEPENDENT_AMBULATORY_CARE_PROVIDER_SITE_OTHER): Payer: Self-pay | Admitting: Physician Assistant

## 2012-10-08 ENCOUNTER — Ambulatory Visit
Admission: RE | Admit: 2012-10-08 | Discharge: 2012-10-08 | Disposition: A | Discharge status: Home / Self Care | Payer: Medicare Other | Source: Ambulatory Visit | Attending: Thoracic Surgery (Cardiothoracic Vascular Surgery) | Admitting: Thoracic Surgery (Cardiothoracic Vascular Surgery)

## 2012-10-08 VITALS — BP 133/84 | HR 92 | Resp 20 | Ht 71.0 in | Wt 190.0 lb

## 2012-10-08 DIAGNOSIS — I313 Pericardial effusion (noninflammatory): Secondary | ICD-10-CM

## 2012-10-08 DIAGNOSIS — I319 Disease of pericardium, unspecified: Secondary | ICD-10-CM

## 2012-10-08 DIAGNOSIS — Z09 Encounter for follow-up examination after completed treatment for conditions other than malignant neoplasm: Secondary | ICD-10-CM

## 2012-10-08 NOTE — Progress Notes (Signed)
       [address]       [place] [address]             [phone]          HPI: Patient returns for routine postoperative follow-up having undergone subxiphoid pericardial window by Dr. Cornelius Moras on 09/16/2012. The patient's postoperative course was uneventful, and he was discharged home on 09/20/2012 in good condition.  Since hospital discharge, the patient had some generalized weakness and tires easily.  He initially had a poor appetite and "heartburn" type symptoms relieved with belching, but over the last week or so, this has improved.  His breathing has remained stable, and he denies chest pain, dyspnea, orthopnea or PND. He has an appointment with Dr. Excell Seltzer later this week.    Current Outpatient Prescriptions  Medication Sig Dispense Refill  . atorvastatin (LIPITOR) 40 MG tablet Take 40 mg by mouth daily.      . colchicine 0.6 MG tablet Take 1 tablet (0.6 mg total) by mouth 2 (two) times daily.  60 tablet  1  . donepezil (ARICEPT) 10 MG tablet Take 1 tablet (10 mg total) by mouth at bedtime.  90 tablet  1  . Multiple Vitamins-Minerals (EQL PROTECTAVISION PO) Take 1 tablet by mouth daily.        . Omega-3 Fatty Acids (FISH OIL PO) Take 1 capsule by mouth daily.      . pantoprazole (PROTONIX) 40 MG tablet Take 1 tablet (40 mg total) by mouth daily.  30 tablet  6  . sertraline (ZOLOFT) 50 MG tablet Take 50 mg by mouth daily.      . sitaGLIPtin (JANUVIA) 100 MG tablet Take 1 tablet (100 mg total) by mouth daily.  30 tablet  11   No current facility-administered medications for this visit.     Physical Exam: BP 133/71 HR 95 Resp 20 Wounds: Clean and dry Heart: regular rate and rhythm Lungs: Clear to auscultation    Diagnostic Tests: Chest xray: Dg Chest 2 View  10/08/2012   *RADIOLOGY REPORT*  Clinical Data: History of lung cancer.  CHEST - 2 VIEW  Comparison: 09/19/2012.  Findings: Borderline enlarged cardiac silhouette with an interval decrease in size.   Stable surgical clips on the left.  Improved aeration at the left lung base.  The lungs are currently clear. The interstitial markings remain mildly prominent in the right lung.  The hemidiaphragms are flattened.  Thoracic spine degenerative changes, including changes of DISH.  Mild scoliosis.  IMPRESSION: Stable COPD and postoperative changes.  No acute abnormality.   Original Report Authenticated By: Beckie Salts, M.D.    Pathology: BENIGN FIBROUS SOFT TISSUE WITH MILD ASSOCIATED CHRONIC INFLAMMATION- NO TUMOR SEEN     Assessment/Plan: Mr.Lein is recovering well status post subxiphoid pericardial window.  His final pathology showed no evidence of malignancy, and cytology and cultures were negative.  He is currently being treated with colchicine by Cardiology.  He has a follow up appointment later this week with Dr. Excell Seltzer, and a repeat echocardiogram is planned. He is doing well from a surgical standpoint.  He may begin driving at this point and resume his usual activities as tolerated. We will see him back as needed.

## 2012-10-09 ENCOUNTER — Ambulatory Visit: Payer: Medicare Other | Admitting: Thoracic Surgery (Cardiothoracic Vascular Surgery)

## 2012-10-11 ENCOUNTER — Ambulatory Visit (INDEPENDENT_AMBULATORY_CARE_PROVIDER_SITE_OTHER): Payer: Medicare Other | Admitting: Physician Assistant

## 2012-10-11 ENCOUNTER — Encounter: Payer: Self-pay | Admitting: Physician Assistant

## 2012-10-11 VITALS — BP 127/76 | HR 87 | Ht 71.0 in | Wt 178.0 lb

## 2012-10-11 DIAGNOSIS — I319 Disease of pericardium, unspecified: Secondary | ICD-10-CM

## 2012-10-11 DIAGNOSIS — K219 Gastro-esophageal reflux disease without esophagitis: Secondary | ICD-10-CM

## 2012-10-11 DIAGNOSIS — I2510 Atherosclerotic heart disease of native coronary artery without angina pectoris: Secondary | ICD-10-CM

## 2012-10-11 DIAGNOSIS — I314 Cardiac tamponade: Secondary | ICD-10-CM

## 2012-10-11 DIAGNOSIS — I10 Essential (primary) hypertension: Secondary | ICD-10-CM

## 2012-10-11 DIAGNOSIS — E785 Hyperlipidemia, unspecified: Secondary | ICD-10-CM

## 2012-10-11 MED ORDER — COLCHICINE 0.6 MG PO TABS: 0.6000 mg | ORAL_TABLET | Freq: Every day | ORAL | Status: DC

## 2012-10-11 NOTE — Patient Instructions (Addendum)
FINISH CURRENT BOTTLE OF COLCHICINE 0.6 MG TWICE DAILY  ONCE DONE START COLCHICINE 0.6 MG DAILY  INCREASE PROTONIX TWICE A DAY FOR 2 WEEKS THEN RESUME DAILY  FOLLOW UP WITH DR.COOPER IN 2 MONTHS 12/12/12 @ 2:30 pm

## 2012-10-11 NOTE — Progress Notes (Signed)
[address], [address] [place], [place]  [address] Phone: [phone] Fax:  [phone]  Date:  10/11/2012   ID:  [name], DOB 06-04-1930, MRN [id number]  PCP:  Kerby Nora, MD  Cardiologist:  Dr. Tonny Bollman     History of Present Illness: [name] is a 79 y.o. male who returns for f/u after a recent admission to the hospital 6/12-6/19 with acute pericarditis c/b pericardial tamponade requiring subxiphoid window.  He has a hx of non-obs CAD, DM2, HTN, HL, Alzheimer's dementia, Lung CA, prostate CA.  He presented with 2-3 days of sharp CP worse with deep breathing and lying supine.  ECG demonstrated ST elevation in 1 aVL.  Emergent LHC 09/13/12: pLAD 50-60, at origin of D1 60-70, oD1 50, EF 55-65%.  With nonobstructive disease, it was felt that his symptoms were likely related to acute pericarditis and he was placed on high-dose aspirin and colchicine. Echocardiogram demonstrated moderate to large pericardial effusion. Patient then developed tachycardia and increased dyspnea. Follow up echocardiogram demonstrated larger pericardial effusion with tamponade physiology. Pericardiocentesis was unsuccessful. He therefore underwent pericardial window by Dr. Cornelius Moras. Aspirin was discontinued in light of subxiphoid window. He was maintained on colchicine.  F/u Echo 09/19/12: mod LVH, EF 55%, Gr 1 DD, Tr MR, mild RVE, no residual effusion.  Cytology was neg for malignant cells and cultures were negative.    He has seen [facility] in f/u.  No further f/u needed.  He had some issues with memory when he first returned home.  This has since improved.  He has had a poor appetite but this is improved.  He denies recurrent CP.  He does note some gas that is relieved with belching.  This is quite frequent.  He denies dysphagia.  No significant dyspnea.  No orthopnea, PND, edema, syncope.  No cough.    Labs (6/14):  K 4.7, Cr 1.11, ALT 16, LDl 39, Hgb 11.6  Wt Readings from Last 3 Encounters:    10/11/12 178 lb (80.74 kg)  10/08/12 190 lb (86.183 kg)  09/20/12 190 lb 1.6 oz (86.229 kg)     Past Medical History  Diagnosis Date  . Unspecified essential hypertension   . Alzheimer's disease   . Anxiety states   . Malignant neoplasm of prostate   . Cellulitis and abscess of unspecified site   . Personal history of malignant neoplasm of bronchus and lung   . Squamous cell carcinoma of skin   . Lung cancer, lower lobe 07/25/2011    T1N0 stage I  adenoca left lung resected 01/03/00  . Prostate ca 07/25/2011    Gleason 7  S/P prostatectomy 04/11/01  . Nonmelanoma skin cancer 07/25/2011    Multiple lesions excised face/nose  . History of colonic polyps 07/25/2011  . Type 2 diabetes mellitus     Hgb A1C 7.4% 08/2012  . CAD (coronary artery disease)     a. 09/2012 Cath: LM nl, LAD 50-60p, D1 60-70 m, D1 50ost, LCX nl, RCA min irregs, EF 55-65%.  . Pericarditis     a. 09/2012 with effusion and tamponade, s/p window.  b.  F/u Echo 09/19/12: mod LVH, EF 55%, Gr 1 DD, Tr MR, mild RVE, no residual effusion    Current Outpatient Prescriptions  Medication Sig Dispense Refill  . atorvastatin (LIPITOR) 40 MG tablet Take 40 mg by mouth daily.      Marland Kitchen donepezil (ARICEPT) 10 MG tablet Take 1 tablet (10 mg total) by mouth  at bedtime.  90 tablet  1  . Omega-3 Fatty Acids (FISH OIL PO) Take 1 capsule by mouth daily.      . pantoprazole (PROTONIX) 40 MG tablet Take 1 tablet (40 mg total) by mouth daily.  30 tablet  6  . sertraline (ZOLOFT) 50 MG tablet Take 50 mg by mouth daily.      . sitaGLIPtin (JANUVIA) 100 MG tablet Take 1 tablet (100 mg total) by mouth daily.  30 tablet  11  . colchicine 0.6 MG tablet Take 1 tablet (0.6 mg total) by mouth daily.  30 tablet  2   No current facility-administered medications for this visit.    Allergies:    Allergies  Allergen Reactions  . Sulfa Antibiotics Nausea Only    Social History:  The patient  reports that he quit smoking about 28 years ago. He has quit  using smokeless tobacco. He reports that he does not drink alcohol or use illicit drugs.   ROS:  Please see the history of present illness.      All other systems reviewed and negative.   PHYSICAL EXAM: VS:  BP 127/76  Pulse 87  Ht 5\' 11"  (1.803 m)  Wt 178 lb (80.74 kg)  BMI 24.84 kg/m2 Well nourished, well developed, in no acute distress HEENT: normal Neck: no JVD Vascular: no carotid bruits Cardiac:  normal S1, S2; RRR; no murmur Chest: Subxiphoid incision without erythema or d/c Lungs:  clear to auscultation bilaterally, no wheezing, rhonchi or rales Abd: soft, nontender, no hepatomegaly Ext: no edema Skin: warm and dry Neuro:  CNs 2-12 intact, no focal abnormalities noted  EKG:  NSR, HR 87, NSSTTW changes     ASSESSMENT AND PLAN:  1. Pericarditis c/b Tamponade, s/p Pericardial Window:  Doing well.  He has some increased gas.  ? If this is from the colchicine.  Reduce colchicine to QD after one month.  Will need to continue colchicine for total of 3 mos.  Increase Protonix to bid for 2 weeks.   2. GERD:  Adjust PPI as noted. 3. CAD:  Non-obs by cath.  Continue statin.  Will check with Dr. Cornelius Moras to make sure ok to resume ASA now.   4. Hypertension:  Controlled.  Continue current therapy.  5. Hyperlipidemia:  Continue statin.  6. Disposition:  F/u with Dr. Tonny Bollman in 2 mos.    Signed, Tereso Newcomer, PA-C  10/11/2012 12:22 PM

## 2012-10-12 ENCOUNTER — Telehealth: Payer: Self-pay

## 2012-10-12 NOTE — Telephone Encounter (Signed)
Patient may start ASA 81 mg QD. I checked with Dr. Cornelius Moras and he was ok with him starting it as well. Tereso Newcomer, PA-C 10/12/2012 2:50 PM    Pt advised, he verbalized understanding.

## 2012-10-23 ENCOUNTER — Encounter (INDEPENDENT_AMBULATORY_CARE_PROVIDER_SITE_OTHER): Payer: Medicare Other | Admitting: Ophthalmology

## 2012-10-23 DIAGNOSIS — H2510 Age-related nuclear cataract, unspecified eye: Secondary | ICD-10-CM

## 2012-10-23 DIAGNOSIS — E1139 Type 2 diabetes mellitus with other diabetic ophthalmic complication: Secondary | ICD-10-CM

## 2012-10-23 DIAGNOSIS — E11319 Type 2 diabetes mellitus with unspecified diabetic retinopathy without macular edema: Secondary | ICD-10-CM

## 2012-10-23 DIAGNOSIS — H43819 Vitreous degeneration, unspecified eye: Secondary | ICD-10-CM

## 2012-10-23 DIAGNOSIS — H353290 Exudative age-related macular degeneration, unspecified eye, stage unspecified: Secondary | ICD-10-CM

## 2012-10-23 DIAGNOSIS — H3530 Unspecified macular degeneration: Secondary | ICD-10-CM

## 2012-10-29 ENCOUNTER — Telehealth: Payer: Self-pay | Admitting: Family Medicine

## 2012-10-29 NOTE — Telephone Encounter (Signed)
i am ok with that - i remember him from that admission. He and his wife seemed nice.

## 2012-10-29 NOTE — Telephone Encounter (Signed)
Dr. Ermalene Searing and Dr. Patsy Lager,  [name] has requested a transfer of physicians from Dr. Ermalene Searing to Dr. Patsy Lager. Will this be ok? The reasoning behind the transfer is that Dr. Patsy Lager sent him to the hospital for his chest pain and ended up in emergency surgery. If this did not happen, Legacy's wife states he would have died! She also states since Dr. Patsy Lager seen him for the chest pain he is more familiar with his situation and his condition.

## 2012-10-29 NOTE — Telephone Encounter (Signed)
No problem.

## 2012-11-02 ENCOUNTER — Other Ambulatory Visit (INDEPENDENT_AMBULATORY_CARE_PROVIDER_SITE_OTHER): Payer: Medicare Other

## 2012-11-02 DIAGNOSIS — E119 Type 2 diabetes mellitus without complications: Secondary | ICD-10-CM

## 2012-11-02 LAB — MICROALBUMIN / CREATININE URINE RATIO
Creatinine,U: 176 mg/dL
Microalb Creat Ratio: 0.3 mg/g (ref 0.0–30.0)
Microalb, Ur: 0.6 mg/dL (ref 0.0–1.9)

## 2012-11-02 LAB — HEMOGLOBIN A1C: Hgb A1c MFr Bld: 7.4 % — ABNORMAL HIGH (ref 4.6–6.5)

## 2012-11-02 NOTE — Addendum Note (Signed)
Addended by: Alvina Chou on: 11/02/2012 09:18 AM   Modules accepted: Orders

## 2012-11-05 ENCOUNTER — Encounter: Payer: Self-pay | Admitting: Family Medicine

## 2012-11-05 ENCOUNTER — Ambulatory Visit (INDEPENDENT_AMBULATORY_CARE_PROVIDER_SITE_OTHER): Payer: Medicare Other | Admitting: Family Medicine

## 2012-11-05 VITALS — BP 128/72 | HR 88 | Temp 97.9°F | Ht 71.0 in | Wt 184.0 lb

## 2012-11-05 DIAGNOSIS — I309 Acute pericarditis, unspecified: Secondary | ICD-10-CM

## 2012-11-05 DIAGNOSIS — I10 Essential (primary) hypertension: Secondary | ICD-10-CM

## 2012-11-05 DIAGNOSIS — E7800 Pure hypercholesterolemia, unspecified: Secondary | ICD-10-CM

## 2012-11-05 DIAGNOSIS — E119 Type 2 diabetes mellitus without complications: Secondary | ICD-10-CM

## 2012-11-05 NOTE — Patient Instructions (Addendum)
Metformin 3 times a day   F/u 3 months

## 2012-11-05 NOTE — Progress Notes (Signed)
[facility] at [facility] [address] [place] [address] Phone: [phone] Fax: [phone]  Date:  11/05/2012   Name:  [name]   DOB:  Mar 19, 1931   MRN:  [id number] Gender: male Age: 79 y.o.  Primary Physician:  Hannah Beat, MD  Evaluating MD: Hannah Beat, MD   Chief Complaint: Follow-up   History of Present Illness:  [name] is a 79 y.o. pleasant patient who presents with the following:  Hosp f/u:  Recent admission to the hospital 6/12-6/19 with acute pericarditis c/b pericardial tamponade requiring subxiphoid window. He has a hx of non-obs CAD, DM2, HTN, HL, Alzheimer's dementia, Lung CA, prostate CA. He presented with 2-3 days of sharp CP worse with deep breathing and lying supine. ECG demonstrated ST elevation in 1 aVL. Emergent LHC 09/13/12: pLAD 50-60, at origin of D1 60-70, oD1 50, EF 55-65%. With nonobstructive disease, it was felt that his symptoms were likely related to acute pericarditis and he was placed on high-dose aspirin and colchicine. Echocardiogram demonstrated moderate to large pericardial effusion. Patient then developed tachycardia and increased dyspnea. Follow up echocardiogram demonstrated larger pericardial effusion with tamponade physiology. Pericardiocentesis was unsuccessful. He therefore underwent pericardial window by Dr. Cornelius Moras. Aspirin was discontinued in light of subxiphoid window. He was maintained on colchicine. F/u Echo 09/19/12: mod LVH, EF 55%, Gr 1 DD, Tr MR, mild RVE, no residual effusion. Cytology was neg for malignant cells and cultures were negative.   In the outpatient setting, he actually has not filled his colchicine due to cost. He is generally feeling well now, though it took him some weeks post-op.  Diabetes Mellitus: Tolerating Medications: yes Compliance with diet: fair Exercise: no Foot problems: none Hypoglycemia: none No nausea, vomitting, blurred vision, polyuria.  Lab Results    Component Value Date   HGBA1C 7.4* 11/02/2012    Wt Readings from Last 3 Encounters:  11/05/12 184 lb (83.462 kg)  10/11/12 178 lb (80.74 kg)  10/08/12 190 lb (86.183 kg)    Body mass index is 25.67 kg/(m^2).   HTN: Tolerating all medications without side effects Stable and at goal No CP, no sob. No HA.  BP Readings from Last 3 Encounters:  11/05/12 128/72  10/11/12 127/76  10/08/12 133/84    Basic Metabolic Panel:    Component Value Date/Time   NA 136 09/20/2012 0605   NA 140 07/20/2012 1046   K 4.7 09/20/2012 0605   K 4.4 07/20/2012 1046   CL 99 09/20/2012 0605   CL 103 07/20/2012 1046   CO2 32 09/20/2012 0605   CO2 28 07/20/2012 1046   BUN 14 09/20/2012 0605   BUN 15.8 07/20/2012 1046   CREATININE 1.11 09/20/2012 0605   CREATININE 1.13 08/03/2012 1524   CREATININE 1.2 07/20/2012 1046   GLUCOSE 170* 09/20/2012 0605   GLUCOSE 215* 07/20/2012 1046   CALCIUM 8.9 09/20/2012 0605   CALCIUM 9.0 07/20/2012 1046   Lipids: Doing well, stable. Tolerating meds fine with no SE. Panel reviewed with patient.  Lipids:    Component Value Date/Time   CHOL 91 09/13/2012 1400   TRIG 77 09/13/2012 1400   HDL 37* 09/13/2012 1400   LDLDIRECT 130.0 08/06/2010 0856   VLDL 15 09/13/2012 1400   CHOLHDL 2.5 09/13/2012 1400    Lab Results  Component Value Date   ALT 16 09/18/2012   AST 16 09/18/2012   ALKPHOS 76 09/18/2012   BILITOT 0.3 09/18/2012     No chest pain  or pressure.     Patient Active Problem List   Diagnosis Date Noted  . Pericardial effusion, acute 09/20/2012  . Pericardial tamponade 09/17/2012  . Coronary atherosclerosis of native coronary artery 09/17/2012  . Acute pericarditis, unspecified 09/13/2012  . Acute bronchitis 08/09/2012  . Lung cancer, lower lobe 07/25/2011  . Prostate ca 07/25/2011  . Nonmelanoma skin cancer 07/25/2011  . History of colonic polyps 07/25/2011  . VERTIGO 07/22/2008  . DIABETES MELLITUS 05/21/2007  . HYPERCHOLESTEROLEMIA 05/21/2007  . CARCINOMA,  SKIN, SQUAMOUS CELL 01/08/2007  . NEOP, MALIGNANT, PROSTATE 01/08/2007  . ANXIETY 01/08/2007  . ALZHEIMER'S DISEASE, MILD 01/08/2007  . HYPERTENSION 01/08/2007  . HX, PERSONAL, MALIGNANCY, BRONCHUS/LUNG 01/08/2007    Past Medical History  Diagnosis Date  . Unspecified essential hypertension   . Alzheimer's disease   . Anxiety states   . Malignant neoplasm of prostate   . Cellulitis and abscess of unspecified site   . Personal history of malignant neoplasm of bronchus and lung   . Squamous cell carcinoma of skin   . Lung cancer, lower lobe 07/25/2011    T1N0 stage I  adenoca left lung resected 01/03/00  . Prostate ca 07/25/2011    Gleason 7  S/P prostatectomy 04/11/01  . Nonmelanoma skin cancer 07/25/2011    Multiple lesions excised face/nose  . History of colonic polyps 07/25/2011  . Type 2 diabetes mellitus     Hgb A1C 7.4% 08/2012  . CAD (coronary artery disease)     a. 09/2012 Cath: LM nl, LAD 50-60p, D1 60-70 m, D1 50ost, LCX nl, RCA min irregs, EF 55-65%.  . Pericarditis     a. 09/2012 with effusion and tamponade, s/p window.  b.  F/u Echo 09/19/12: mod LVH, EF 55%, Gr 1 DD, Tr MR, mild RVE, no residual effusion    Past Surgical History  Procedure Laterality Date  . Lobectomy  2001    upper left  . Prostatectomy    . Hernia repair    . Tonsillectomy    . Subxyphoid pericardial window N/A 09/16/2012    Procedure: SUBXYPHOID PERICARDIAL WINDOW;  Surgeon: Purcell Nails, MD;  Location: [facility] OR;  Service: Thoracic;  Laterality: N/A;    History   Social History  . Marital Status: Married    Spouse Name: N/A    Number of Children: N/A  . Years of Education: N/A   Occupational History  . retired    Social History Main Topics  . Smoking status: Former Smoker    Quit date: 04/04/1984  . Smokeless tobacco: Former Neurosurgeon     Comment: previously smoked 4 PPD; quit over 30 years  . Alcohol Use: No  . Drug Use: No  . Sexually Active: Not on file   Other Topics Concern  . Not  on file   Social History Narrative   Lives in [place], [place]   Regular exercise--no, mowing grass   Diet: fruit and veggies    Family History  Problem Relation Age of Onset  . Dementia Mother   . Diabetes Mother   . Cancer Father     colon  . Cancer Brother     lung    Allergies  Allergen Reactions  . Sulfa Antibiotics Nausea Only    Medication list has been reviewed and updated.  Outpatient Prescriptions Prior to Visit  Medication Sig Dispense Refill  . atorvastatin (LIPITOR) 40 MG tablet Take 40 mg by mouth daily.      Marland Kitchen donepezil (ARICEPT) 10  MG tablet Take 1 tablet (10 mg total) by mouth at bedtime.  90 tablet  1  . Omega-3 Fatty Acids (FISH OIL PO) Take 1 capsule by mouth 2 (two) times daily.       . pantoprazole (PROTONIX) 40 MG tablet Take 1 tablet (40 mg total) by mouth daily.  30 tablet  6  . sertraline (ZOLOFT) 50 MG tablet Take 50 mg by mouth daily.      . colchicine 0.6 MG tablet Take 1 tablet (0.6 mg total) by mouth daily.  30 tablet  2  . sitaGLIPtin (JANUVIA) 100 MG tablet Take 1 tablet (100 mg total) by mouth daily.  30 tablet  11   No facility-administered medications prior to visit.    Review of Systems:   GEN: No acute illnesses, no fevers, chills. GI: No n/v/d, eating normally No cp Interactive and getting along well at home.  Otherwise, ROS is as per the HPI.   Physical Examination: BP 128/72  Pulse 88  Temp(Src) 97.9 F (36.6 C) (Oral)  Ht 5\' 11"  (1.803 m)  Wt 184 lb (83.462 kg)  BMI 25.67 kg/m2  Ideal Body Weight: Weight in (lb) to have BMI = 25: 178.9   GEN: WDWN, NAD, Non-toxic, A & O x 3 HEENT: Atraumatic, Normocephalic. Neck supple. No masses, No LAD. Ears and Nose: No external deformity. CV: RRR, No M/G/R. No JVD. No thrill. No extra heart sounds. PULM: CTA B, no wheezes, crackles, rhonchi. No retractions. No resp. distress. No accessory muscle use. EXTR: No c/c/e NEURO Normal gait.  PSYCH: Normally interactive.  Conversant. Not depressed or anxious appearing.  Calm demeanor.    Assessment and Plan:  Acute pericarditis, unspecified: gave him a coupon for colcrys, to take on outpatient basis as rec by cards team, and would be typical for pericarditis  DIABETES MELLITUS: tol meds ok. D/c januvia, try to take metformin tid - he has been limited by gi se in the past.  HYPERCHOLESTEROLEMIA: stable  HYPERTENSION: stable  Orders Today:  No orders of the defined types were placed in this encounter.    Updated Medication List: (Includes new medications, updates to list, dose adjustments) Meds ordered this encounter  Medications  . aspirin 81 MG tablet    Sig: Take 81 mg by mouth daily.  . metFORMIN (GLUCOPHAGE-XR) 500 MG 24 hr tablet    Sig: Take 500 mg by mouth 2 (two) times daily.   . Multiple Vitamins-Minerals (ICAPS LUTEIN & ZEAXANTHIN PO)    Sig: Take by mouth 2 (two) times daily.    Medications Discontinued: Medications Discontinued During This Encounter  Medication Reason  . sitaGLIPtin (JANUVIA) 100 MG tablet       Signed, Syrus Nakama T. Festus Pursel, MD 11/05/2012 11:27 AM

## 2012-11-09 ENCOUNTER — Ambulatory Visit: Payer: Medicare Other | Admitting: Family Medicine

## 2012-11-26 ENCOUNTER — Other Ambulatory Visit: Payer: Self-pay | Admitting: Family Medicine

## 2012-11-27 ENCOUNTER — Encounter (INDEPENDENT_AMBULATORY_CARE_PROVIDER_SITE_OTHER): Payer: Medicare Other | Admitting: Ophthalmology

## 2012-11-27 DIAGNOSIS — H2510 Age-related nuclear cataract, unspecified eye: Secondary | ICD-10-CM

## 2012-11-27 DIAGNOSIS — E11319 Type 2 diabetes mellitus with unspecified diabetic retinopathy without macular edema: Secondary | ICD-10-CM

## 2012-11-27 DIAGNOSIS — H353290 Exudative age-related macular degeneration, unspecified eye, stage unspecified: Secondary | ICD-10-CM

## 2012-11-27 DIAGNOSIS — H43819 Vitreous degeneration, unspecified eye: Secondary | ICD-10-CM

## 2012-11-27 DIAGNOSIS — E1139 Type 2 diabetes mellitus with other diabetic ophthalmic complication: Secondary | ICD-10-CM

## 2012-11-27 DIAGNOSIS — H3530 Unspecified macular degeneration: Secondary | ICD-10-CM

## 2012-12-01 ENCOUNTER — Other Ambulatory Visit: Payer: Self-pay | Admitting: Family Medicine

## 2012-12-12 ENCOUNTER — Ambulatory Visit: Payer: Medicare Other | Admitting: Cardiovascular Disease

## 2013-01-01 ENCOUNTER — Encounter (INDEPENDENT_AMBULATORY_CARE_PROVIDER_SITE_OTHER): Payer: Medicare Other | Admitting: Ophthalmology

## 2013-01-01 DIAGNOSIS — E1139 Type 2 diabetes mellitus with other diabetic ophthalmic complication: Secondary | ICD-10-CM

## 2013-01-01 DIAGNOSIS — H353290 Exudative age-related macular degeneration, unspecified eye, stage unspecified: Secondary | ICD-10-CM

## 2013-01-01 DIAGNOSIS — H3530 Unspecified macular degeneration: Secondary | ICD-10-CM

## 2013-01-01 DIAGNOSIS — E11319 Type 2 diabetes mellitus with unspecified diabetic retinopathy without macular edema: Secondary | ICD-10-CM

## 2013-01-01 DIAGNOSIS — H43819 Vitreous degeneration, unspecified eye: Secondary | ICD-10-CM

## 2013-01-25 ENCOUNTER — Encounter (HOSPITAL_COMMUNITY): Payer: Self-pay | Admitting: Emergency Medicine

## 2013-01-25 ENCOUNTER — Emergency Department (HOSPITAL_COMMUNITY)
Admission: EM | Admit: 2013-01-25 | Discharge: 2013-01-25 | Disposition: A | Discharge status: Home / Self Care | Payer: Medicare Other | Attending: Emergency Medicine | Admitting: Emergency Medicine

## 2013-01-25 DIAGNOSIS — Z8546 Personal history of malignant neoplasm of prostate: Secondary | ICD-10-CM | POA: Insufficient documentation

## 2013-01-25 DIAGNOSIS — Z860100 Personal history of colon polyps, unspecified: Secondary | ICD-10-CM | POA: Insufficient documentation

## 2013-01-25 DIAGNOSIS — G309 Alzheimer's disease, unspecified: Secondary | ICD-10-CM | POA: Insufficient documentation

## 2013-01-25 DIAGNOSIS — IMO0001 Reserved for inherently not codable concepts without codable children: Secondary | ICD-10-CM

## 2013-01-25 DIAGNOSIS — E119 Type 2 diabetes mellitus without complications: Secondary | ICD-10-CM | POA: Insufficient documentation

## 2013-01-25 DIAGNOSIS — F411 Generalized anxiety disorder: Secondary | ICD-10-CM | POA: Insufficient documentation

## 2013-01-25 DIAGNOSIS — Z85828 Personal history of other malignant neoplasm of skin: Secondary | ICD-10-CM | POA: Insufficient documentation

## 2013-01-25 DIAGNOSIS — I2510 Atherosclerotic heart disease of native coronary artery without angina pectoris: Secondary | ICD-10-CM | POA: Insufficient documentation

## 2013-01-25 DIAGNOSIS — Z23 Encounter for immunization: Secondary | ICD-10-CM | POA: Insufficient documentation

## 2013-01-25 DIAGNOSIS — F0280 Dementia in other diseases classified elsewhere without behavioral disturbance: Secondary | ICD-10-CM | POA: Insufficient documentation

## 2013-01-25 DIAGNOSIS — Z7982 Long term (current) use of aspirin: Secondary | ICD-10-CM | POA: Insufficient documentation

## 2013-01-25 DIAGNOSIS — I83893 Varicose veins of bilateral lower extremities with other complications: Secondary | ICD-10-CM | POA: Insufficient documentation

## 2013-01-25 DIAGNOSIS — Z872 Personal history of diseases of the skin and subcutaneous tissue: Secondary | ICD-10-CM | POA: Insufficient documentation

## 2013-01-25 DIAGNOSIS — Z85118 Personal history of other malignant neoplasm of bronchus and lung: Secondary | ICD-10-CM | POA: Insufficient documentation

## 2013-01-25 DIAGNOSIS — I10 Essential (primary) hypertension: Secondary | ICD-10-CM | POA: Insufficient documentation

## 2013-01-25 DIAGNOSIS — Z79899 Other long term (current) drug therapy: Secondary | ICD-10-CM | POA: Insufficient documentation

## 2013-01-25 MED ORDER — TETANUS-DIPHTH-ACELL PERTUSSIS 5-2.5-18.5 LF-MCG/0.5 IM SUSP
0.5000 mL | Freq: Once | INTRAMUSCULAR | Status: AC
  Administered 2013-01-25: 0.5 mL via INTRAMUSCULAR
  Filled 2013-01-25: qty 0.5

## 2013-01-25 NOTE — ED Notes (Addendum)
Rt. Lower leg laceration.  Doesn't remember hurting it. Is diabetic, dec. Sensation.  Sm. Puncture wound. Bleeding controlled. Pt. Taking trash out to dumpster, then came in and noticed it was bleeding. No cp, no sob, no back pain or other symptoms.

## 2013-01-25 NOTE — ED Provider Notes (Signed)
CSN: [id number]     Arrival date & time 01/25/13  2108 History   First MD Initiated Contact with Patient 01/25/13 2133     Chief Complaint  Patient presents with  . Extremity Laceration   HPI  History provided by the patient. Patient is an 79 year old male presenting with bleeding from his lower right leg. Patient first noticed the bleeding after walking back into his house after taking the trash out. He did not remember hitting his leg or having any pain. Bleeding has been hard to control but does slow with pressure bandage. He denies any change in sensations to the foot. No weakness. Patient does take baby aspirin daily. No other blood thinners. No other aggravating or alleviating factors. No other associated symptoms.   Past Medical History  Diagnosis Date  . Unspecified essential hypertension   . Alzheimer's disease   . Anxiety states   . Malignant neoplasm of prostate   . Cellulitis and abscess of unspecified site   . Personal history of malignant neoplasm of bronchus and lung   . Squamous cell carcinoma of skin   . Lung cancer, lower lobe 07/25/2011    T1N0 stage I  adenoca left lung resected 01/03/00  . Prostate ca 07/25/2011    Gleason 7  S/P prostatectomy 04/11/01  . Nonmelanoma skin cancer 07/25/2011    Multiple lesions excised face/nose  . History of colonic polyps 07/25/2011  . Type 2 diabetes mellitus     Hgb A1C 7.4% 08/2012  . CAD (coronary artery disease)     a. 09/2012 Cath: LM nl, LAD 50-60p, D1 60-70 m, D1 50ost, LCX nl, RCA min irregs, EF 55-65%.  . Pericarditis     a. 09/2012 with effusion and tamponade, s/p window.  b.  F/u Echo 09/19/12: mod LVH, EF 55%, Gr 1 DD, Tr MR, mild RVE, no residual effusion   Past Surgical History  Procedure Laterality Date  . Lobectomy  2001    upper left  . Prostatectomy    . Hernia repair    . Tonsillectomy    . Subxyphoid pericardial window N/A 09/16/2012    Procedure: SUBXYPHOID PERICARDIAL WINDOW;  Surgeon: Purcell Nails, MD;   Location: [facility] OR;  Service: Thoracic;  Laterality: N/A;   Family History  Problem Relation Age of Onset  . Dementia Mother   . Diabetes Mother   . Cancer Father     colon  . Cancer Brother     lung   History  Substance Use Topics  . Smoking status: Former Smoker    Quit date: 04/04/1984  . Smokeless tobacco: Former Neurosurgeon     Comment: previously smoked 4 PPD; quit over 30 years  . Alcohol Use: No    Review of Systems  Constitutional: Negative for fever, chills, diaphoresis and fatigue.  Respiratory: Negative for shortness of breath.   Cardiovascular: Negative for chest pain.  Neurological: Negative for weakness.  All other systems reviewed and are negative.    Allergies  Sulfa antibiotics  Home Medications   Current Outpatient Rx  Name  Route  Sig  Dispense  Refill  . aspirin EC 81 MG tablet   Oral   Take 81 mg by mouth daily.         Marland Kitchen atorvastatin (LIPITOR) 40 MG tablet   Oral   Take 40 mg by mouth daily.         . colchicine 0.6 MG tablet   Oral   Take 1 tablet (0.6  mg total) by mouth daily.   30 tablet   2   . donepezil (ARICEPT) 10 MG tablet   Oral   Take 1 tablet (10 mg total) by mouth at bedtime.   90 tablet   1   . metFORMIN (GLUCOPHAGE-XR) 500 MG 24 hr tablet   Oral   Take 500 mg by mouth 2 (two) times daily.         . Multiple Vitamins-Minerals (ICAPS LUTEIN & ZEAXANTHIN PO)   Oral   Take 1 capsule by mouth 2 (two) times daily.          . Omega-3 Fatty Acids (FISH OIL) 1000 MG CAPS   Oral   Take 1,000 mg by mouth at bedtime.         . sertraline (ZOLOFT) 50 MG tablet   Oral   Take 50 mg by mouth at bedtime.           BP 159/79  Pulse 77  Temp(Src) 97.4 F (36.3 C) (Oral)  Resp 16  Ht 5\' 11"  (1.803 m)  Wt 186 lb (84.369 kg)  BMI 25.95 kg/m2  SpO2 100% Physical Exam  Nursing note and vitals reviewed. Constitutional: He is oriented to person, place, and time. He appears well-developed and well-nourished. No distress.   HENT:  Head: Normocephalic.  Cardiovascular: Normal rate and regular rhythm.   Pulmonary/Chest: Effort normal and breath sounds normal.  Musculoskeletal: Normal range of motion. He exhibits no edema and no tenderness.  Neurological: He is alert and oriented to person, place, and time.  Skin: Skin is warm.  Small bleeding varicose vein to the right anterior lateral lower leg. Bleeding has slowed pressure. Normal distal pulses in the foot.   Psychiatric: He has a normal mood and affect. His behavior is normal.    ED Course  Procedures    LACERATION REPAIR Performed by: Angus Seller Authorized by: Angus Seller Consent: Verbal consent obtained. Risks and benefits: risks, benefits and alternatives were discussed Consent given by: patient Patient identity confirmed: provided demographic data Prepped and Draped in normal sterile fashion Wound explored  Laceration Location: right lower lateral leg  Laceration Length: 0.5 cm  No Foreign Bodies seen or palpated  Anesthesia: local infiltration  Local anesthetic: lidocaine 2% with epinephrine  Anesthetic total: 1 ml  Irrigation method: syringe Amount of cleaning: standard  Skin closure: Skin with 4-0 Vicryl rapid   Number of sutures: 1   Technique: A horizontal mattress   Patient tolerance: Patient tolerated the procedure well with no immediate complications.      MDM   1. Bleeding from varicose vein of right side          Angus Seller, PA-C 01/25/13 2227

## 2013-01-26 NOTE — ED Provider Notes (Signed)
Medical screening examination/treatment/procedure(s) were performed by non-physician practitioner and as supervising physician I was immediately available for consultation/collaboration.  EKG Interpretation   None         Enid Skeens, MD 01/26/13 [phone]

## 2013-02-06 ENCOUNTER — Ambulatory Visit (INDEPENDENT_AMBULATORY_CARE_PROVIDER_SITE_OTHER): Payer: Medicare Other | Admitting: Family Medicine

## 2013-02-06 ENCOUNTER — Encounter: Payer: Self-pay | Admitting: Family Medicine

## 2013-02-06 VITALS — BP 114/72 | HR 67 | Temp 97.8°F | Ht 71.0 in | Wt 186.0 lb

## 2013-02-06 DIAGNOSIS — Z87891 Personal history of nicotine dependence: Secondary | ICD-10-CM

## 2013-02-06 DIAGNOSIS — C3492 Malignant neoplasm of unspecified part of left bronchus or lung: Secondary | ICD-10-CM

## 2013-02-06 DIAGNOSIS — I314 Cardiac tamponade: Secondary | ICD-10-CM

## 2013-02-06 DIAGNOSIS — F411 Generalized anxiety disorder: Secondary | ICD-10-CM

## 2013-02-06 DIAGNOSIS — E7800 Pure hypercholesterolemia, unspecified: Secondary | ICD-10-CM

## 2013-02-06 DIAGNOSIS — I309 Acute pericarditis, unspecified: Secondary | ICD-10-CM

## 2013-02-06 DIAGNOSIS — I2510 Atherosclerotic heart disease of native coronary artery without angina pectoris: Secondary | ICD-10-CM

## 2013-02-06 DIAGNOSIS — C3490 Malignant neoplasm of unspecified part of unspecified bronchus or lung: Secondary | ICD-10-CM

## 2013-02-06 DIAGNOSIS — Z23 Encounter for immunization: Secondary | ICD-10-CM

## 2013-02-06 DIAGNOSIS — F17201 Nicotine dependence, unspecified, in remission: Secondary | ICD-10-CM

## 2013-02-06 DIAGNOSIS — C61 Malignant neoplasm of prostate: Secondary | ICD-10-CM

## 2013-02-06 DIAGNOSIS — I10 Essential (primary) hypertension: Secondary | ICD-10-CM

## 2013-02-06 DIAGNOSIS — C4490 Unspecified malignant neoplasm of skin, unspecified: Secondary | ICD-10-CM

## 2013-02-06 DIAGNOSIS — F0280 Dementia in other diseases classified elsewhere without behavioral disturbance: Secondary | ICD-10-CM

## 2013-02-06 DIAGNOSIS — E119 Type 2 diabetes mellitus without complications: Secondary | ICD-10-CM

## 2013-02-06 LAB — LIPID PANEL
Cholesterol: 131 mg/dL (ref 0–200)
HDL: 37.9 mg/dL — ABNORMAL LOW (ref >39.00)
LDL Cholesterol: 58 mg/dL (ref 0–99)
Total CHOL/HDL Ratio: 3
Triglycerides: 174 mg/dL — ABNORMAL HIGH (ref 0.0–149.0)
VLDL: 34.8 mg/dL (ref 0.0–40.0)

## 2013-02-06 LAB — HEMOGLOBIN A1C: Hgb A1c MFr Bld: 7.8 % — ABNORMAL HIGH (ref 4.6–6.5)

## 2013-02-06 LAB — BASIC METABOLIC PANEL
BUN: 13 mg/dL (ref 6–23)
CO2: 29 mEq/L (ref 19–32)
Calcium: 9.2 mg/dL (ref 8.4–10.5)
Chloride: 103 mEq/L (ref 96–112)
Creatinine, Ser: 1.1 mg/dL (ref 0.4–1.5)
GFR: 66.31 mL/min (ref >60.00)
Glucose, Bld: 200 mg/dL — ABNORMAL HIGH (ref 70–99)
Potassium: 5.5 mEq/L — ABNORMAL HIGH (ref 3.5–5.1)
Sodium: 137 mEq/L (ref 135–145)

## 2013-02-06 NOTE — Progress Notes (Signed)
Pre-visit discussion using our clinic review tool. No additional management support is needed unless otherwise documented below in the visit note.  

## 2013-02-08 ENCOUNTER — Encounter: Payer: Self-pay | Admitting: Cardiovascular Disease

## 2013-02-08 ENCOUNTER — Ambulatory Visit (INDEPENDENT_AMBULATORY_CARE_PROVIDER_SITE_OTHER): Payer: Medicare Other | Admitting: Cardiovascular Disease

## 2013-02-08 ENCOUNTER — Encounter: Payer: Self-pay | Admitting: Family Medicine

## 2013-02-08 VITALS — BP 124/68 | HR 84 | Ht 70.0 in | Wt 187.0 lb

## 2013-02-08 DIAGNOSIS — F17201 Nicotine dependence, unspecified, in remission: Secondary | ICD-10-CM

## 2013-02-08 DIAGNOSIS — I2510 Atherosclerotic heart disease of native coronary artery without angina pectoris: Secondary | ICD-10-CM

## 2013-02-08 HISTORY — DX: Nicotine dependence, unspecified, in remission: F17.201

## 2013-02-08 MED ORDER — GLIPIZIDE 5 MG PO TABS: 2.5000 mg | ORAL_TABLET | Freq: Every day | ORAL | Status: DC

## 2013-02-08 NOTE — Progress Notes (Signed)
HPI:   [name] is a 79 y.o. male who returns for f/u of pericardial disease. He was hospitalized 6/12-6/19 with acute pericarditis c/b pericardial tamponade requiring subxiphoid window. He has a hx of non-obs CAD, DM2, HTN, HL, Alzheimer's dementia, Lung CA, prostate CA. He presented with 2-3 days of sharp CP worse with deep breathing and lying supine. ECG demonstrated ST elevation in 1 aVL. Emergent LHC 09/13/12: pLAD 50-60, at origin of D1 60-70, oD1 50, EF 55-65%. With nonobstructive disease, it was felt that his symptoms were likely related to acute pericarditis and he was placed on high-dose aspirin and colchicine. Echocardiogram demonstrated moderate to large pericardial effusion. Patient then developed tachycardia and increased dyspnea. Follow up echocardiogram demonstrated larger pericardial effusion with tamponade physiology. Pericardiocentesis was unsuccessful. He therefore underwent pericardial window by Dr. Cornelius Moras. Aspirin was discontinued in light of subxiphoid window. He was maintained on colchicine. F/u Echo 09/19/12: mod LVH, EF 55%, Gr 1 DD, Tr MR, mild RVE, no residual effusion. Cytology was neg for malignant cells and cultures were negative.   The patient is doing very well. He is walking a few times every day without symptoms of exertion. He denies any recurrence of chest pain. He denies shortness of breath, leg swelling, or lightheadedness.   Outpatient Encounter Prescriptions as of 02/08/2013  Medication Sig  . aspirin EC 81 MG tablet Take 81 mg by mouth daily.  Marland Kitchen atorvastatin (LIPITOR) 40 MG tablet Take 40 mg by mouth daily.  Marland Kitchen donepezil (ARICEPT) 10 MG tablet Take 1 tablet (10 mg total) by mouth at bedtime.  . metFORMIN (GLUCOPHAGE-XR) 500 MG 24 hr tablet Take 500 mg by mouth 2 (two) times daily.  . Multiple Vitamins-Minerals (ICAPS LUTEIN & ZEAXANTHIN PO) Take 1 capsule by mouth 2 (two) times daily.   . Omega-3 Fatty Acids (FISH OIL) 1000 MG CAPS Take 1,000 mg by mouth  at bedtime.  . sertraline (ZOLOFT) 50 MG tablet Take 50 mg by mouth at bedtime.   Marland Kitchen glipiZIDE (GLUCOTROL) 5 MG tablet Take 0.5 tablets (2.5 mg total) by mouth daily before breakfast.    Allergies  Allergen Reactions  . Sulfa Antibiotics Nausea Only    Past Medical History  Diagnosis Date  . Unspecified essential hypertension   . Alzheimer's disease   . Squamous cell carcinoma of skin   . Prostate ca 04/2001    Gleason 7  S/P prostatectomy 04/11/01  . Nonmelanoma skin cancer 07/25/2011    Multiple lesions excised face/nose  . History of colonic polyps 07/25/2011  . CAD (coronary artery disease) 2014    a. 09/2012 Cath: LM nl, LAD 50-60p, D1 60-70 m, D1 50ost, LCX nl, RCA min irregs, EF 55-65%.  . Pericarditis     a. 09/2012 with effusion and tamponade, s/p window.  b.  F/u Echo 09/19/12: mod LVH, EF 55%, Gr 1 DD, Tr MR, mild RVE, no residual effusion  . Type 2 diabetes mellitus with vascular disease 05/21/2007    Qualifier: Diagnosis of  By: Ermalene Searing MD, [name]    . Generalized anxiety disorder 01/08/2007    Qualifier: Diagnosis of  By: Ermalene Searing MD, [name]    . Adenocarcinoma of left lung, stage 1 2001    T1N0 stage I  adenoca left lung resected 01/03/00   . Tobacco abuse, 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/08/2013   BP 124/68  Pulse 84  Ht 5\' 10"  (1.778 m)  Wt 187 lb (84.823 kg)  BMI 26.83 kg/m2  PHYSICAL EXAM: Pt is  alert and oriented, pleasant elderly male in NAD HEENT: normal Neck: JVP - normal, carotids 2+= without bruits Lungs: CTA bilaterally CV: RRR without murmur or gallop Abd: soft, NT, Positive BS, no hepatomegaly Ext: no C/C/E, distal pulses intact and equal Skin: warm/dry no rash  ASSESSMENT AND PLAN: 79 year old gentleman who had acute pericarditis complicated by pericardial effusion and cardiac tamponade. He is status post pericardial window with no residual symptoms. He is on appropriate medical therapy for nonobstructive CAD which includes aspirin and atorvastatin. I did not make any  changes to his medical regimen today. He'll return in followup in one year.  Tonny Bollman 02/08/2013 4:14 PM

## 2013-02-08 NOTE — Progress Notes (Signed)
Date:  02/06/2013   Name:  [name]   DOB:  1930-12-15   MRN:  [id number] Gender: male Age: 79 y.o.  Primary Physician:  Ethan Beat, MD   Chief Complaint: Follow-up   Subjective:   History of Present Illness:  [name] is a 79 y.o. pleasant patient who presents with the following:  Pleasant male with a history of CAD, adenocarcinoma of the lung s/p LUL lobectomy in 2001, prostate cancer s/p prostatectomy, mild alzheimer's, diabetes, HTN, lipids, with a 09/2012 history of cardiac tamponade from pericarditis presents for routine follow-up of all and he is feeling well.  DM: we tried to increase his metformin to tid dosing, but he was limited from GI side effects, so decreased it to bid. BS have been running 140-200.  HTN stable.   Compliant with all cardiac meds.   Not having any SOB.   He really feels quite well.  Patient Active Problem List   Diagnosis Date Noted  . CAD (coronary artery disease)     Priority: High  . Coronary atherosclerosis of native coronary artery 09/17/2012    Priority: High  . Adenocarcinoma of left lung, stage 1 07/25/2011    Priority: High  . Cancer of prostate w/med recur risk (T2b-c or Gleason 7 or PSA 10-20) 07/25/2011    Priority: High  . Type 2 diabetes mellitus with vascular disease 05/21/2007    Priority: High  . Pericardial tamponade 09/17/2012    Priority: Medium  . Acute pericarditis, unspecified 09/13/2012    Priority: Medium  . HYPERCHOLESTEROLEMIA 05/21/2007    Priority: Medium  . HYPERTENSION 01/08/2007    Priority: Medium  . 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 Nonmelanoma skin cancer 07/25/2011  . History of colonic polyps 07/25/2011  . VERTIGO 07/22/2008  . CARCINOMA, SKIN, SQUAMOUS CELL 01/08/2007  . Generalized anxiety disorder 01/08/2007  . ALZHEIMER'S DISEASE, MILD 01/08/2007    Past Medical History  Diagnosis Date  . Unspecified essential hypertension   . Alzheimer's disease   .  Squamous cell carcinoma of skin   . Prostate ca 04/2001    Gleason 7  S/P prostatectomy 04/11/01  . Nonmelanoma skin cancer 07/25/2011    Multiple lesions excised face/nose  . History of colonic polyps 07/25/2011  . CAD (coronary artery disease) 2014    a. 09/2012 Cath: LM nl, LAD 50-60p, D1 60-70 m, D1 50ost, LCX nl, RCA min irregs, EF 55-65%.  . Pericarditis     a. 09/2012 with effusion and tamponade, s/p window.  b.  F/u Echo 09/19/12: mod LVH, EF 55%, Gr 1 DD, Tr MR, mild RVE, no residual effusion  . Type 2 diabetes mellitus with vascular disease 05/21/2007    Qualifier: Diagnosis of  By: Ethan Searing MD, [name]    . Generalized anxiety disorder 01/08/2007    Qualifier: Diagnosis of  By: Ethan Searing MD, [name]    . Adenocarcinoma of left lung, stage 1 2001    T1N0 stage I  adenoca left lung resected 01/03/00   . Tobacco abuse, 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/08/2013    Past Surgical History  Procedure Laterality Date  . Lobectomy  2001    upper left  . Prostatectomy  2003  . Hernia repair    . Tonsillectomy    . Subxyphoid pericardial window N/A 09/16/2012    Procedure: SUBXYPHOID PERICARDIAL WINDOW;  Surgeon: Purcell Nails, MD;  Location: [facility] OR;  Service: Thoracic;  Laterality: N/A;    History   Social History  .  Marital Status: Married    Spouse Name: N/A    Number of Children: N/A  . Years of Education: N/A   Occupational History  . retired    Social History Main Topics  . Smoking status: Former Smoker    Quit date: 04/04/1984  . Smokeless tobacco: Former Neurosurgeon     Comment: previously smoked 4 PPD; quit over 30 years  . Alcohol Use: No  . Drug Use: No  . Sexual Activity: Not on file   Other Topics Concern  . Not on file   Social History Narrative   Lives in [place], [place]   Regular exercise--no, mowing grass   Diet: fruit and veggies    Family History  Problem Relation Age of Onset  . Dementia Mother   . Diabetes Mother   . Cancer Father     colon  . Cancer Brother     lung     Allergies  Allergen Reactions  . Sulfa Antibiotics Nausea Only    Medication list has been reviewed and updated.  Review of Systems:   GEN: No acute illnesses, no fevers, chills. GI: No n/v/d, eating normally Pulm: No SOB Interactive and getting along well at home.  Otherwise, ROS is as per the HPI.   Objective:   Physical Examination: BP 114/72  Pulse 67  Temp(Src) 97.8 F (36.6 C) (Oral)  Ht 5\' 11"  (1.803 m)  Wt 186 lb (84.369 kg)  BMI 25.95 kg/m2  Ideal Body Weight: Weight in (lb) to have BMI = 25: 178.9   GEN: WDWN, NAD, Non-toxic, A & O x 3 HEENT: Atraumatic, Normocephalic. Neck supple. No masses, No LAD. Ears and Nose: No external deformity. CV: RRR, No M/G/R. No JVD. No thrill. No extra heart sounds. PULM: CTA B, no wheezes, crackles, rhonchi. No retractions. No resp. distress. No accessory muscle use. EXTR: No c/c/e NEURO Normal gait.  PSYCH: Normally interactive. Conversant. Not depressed or anxious appearing.  Calm demeanor.    Assessment & Plan:    DIABETES MELLITUS - Plan: Hemoglobin A1c, Basic metabolic panel: a1c is 7.8, add glipizide 2.5 mg. Sulfa "allergy" was nausea. Given precautions.  Need for prophylactic vaccination and inoculation against influenza - Plan: Flu Vaccine QUAD 36+ mos PF IM (Fluarix)  HYPERCHOLESTEROLEMIA - Plan: Lipid panel  Lipids:    Component Value Date/Time   CHOL 131 02/06/2013 1203   TRIG 174.0* 02/06/2013 1203   HDL 37.90* 02/06/2013 1203   LDLDIRECT 130.0 08/06/2010 0856   VLDL 34.8 02/06/2013 1203   CHOLHDL 3 02/06/2013 1203     HYPERTENSION: at goal  Tobacco abuse, in remission  ALZHEIMER'S DISEASE, MILD: doing well  Adenocarcinoma of left lung, stage 1  Cancer of prostate w/med recur risk (T2b-c or Gleason 7 or PSA 10-20)  Acute pericarditis, unspecified - resolved s/p surgery  Pericardial tamponade  Coronary atherosclerosis of native coronary artery  CAD (coronary artery disease)  Generalized  anxiety disorder  CARCINOMA, SKIN, SQUAMOUS CELL  Reviewed problem list and chart in full.  There are no Patient Instructions on file for this visit.  Orders Today:  Orders Placed This Encounter  Procedures  . Flu Vaccine QUAD 36+ mos PF IM (Fluarix)  . Lipid panel  . Hemoglobin A1c  . Basic metabolic panel    New medications, updates to list, dose adjustments: No orders of the defined types were placed in this encounter.    Signed,  Elpidio Galea. Mishawn Didion, MD, CAQ Sports Medicine  [facility]  [address] [place] [address] Phone: [phone] Fax: [phone]  Updated Complete Medication List:   Medication List       This list is accurate as of: 02/06/13 11:59 PM.  Always use your most recent med list.               aspirin EC 81 MG tablet  Take 81 mg by mouth daily.     atorvastatin 40 MG tablet  Commonly known as:  LIPITOR  Take 40 mg by mouth daily.     donepezil 10 MG tablet  Commonly known as:  ARICEPT  Take 1 tablet (10 mg total) by mouth at bedtime.     Fish Oil 1000 MG Caps  Take 1,000 mg by mouth at bedtime.     ICAPS LUTEIN & ZEAXANTHIN PO  Take 1 capsule by mouth 2 (two) times daily.     metFORMIN 500 MG 24 hr tablet  Commonly known as:  GLUCOPHAGE-XR  Take 500 mg by mouth 2 (two) times daily.     sertraline 50 MG tablet  Commonly known as:  ZOLOFT  Take 50 mg by mouth at bedtime.

## 2013-02-08 NOTE — Patient Instructions (Signed)
Your physician wants you to follow-up in: 1 YEAR with Dr Cooper.  You will receive a reminder letter in the mail two months in advance. If you don't receive a letter, please call our office to schedule the follow-up appointment.  Your physician recommends that you continue on your current medications as directed. Please refer to the Current Medication list given to you today.  

## 2013-02-12 ENCOUNTER — Encounter (INDEPENDENT_AMBULATORY_CARE_PROVIDER_SITE_OTHER): Payer: Medicare Other | Admitting: Ophthalmology

## 2013-02-12 DIAGNOSIS — H43819 Vitreous degeneration, unspecified eye: Secondary | ICD-10-CM

## 2013-02-12 DIAGNOSIS — H3530 Unspecified macular degeneration: Secondary | ICD-10-CM

## 2013-02-12 DIAGNOSIS — E1139 Type 2 diabetes mellitus with other diabetic ophthalmic complication: Secondary | ICD-10-CM

## 2013-02-12 DIAGNOSIS — E11319 Type 2 diabetes mellitus with unspecified diabetic retinopathy without macular edema: Secondary | ICD-10-CM

## 2013-02-12 DIAGNOSIS — H2510 Age-related nuclear cataract, unspecified eye: Secondary | ICD-10-CM

## 2013-02-12 DIAGNOSIS — H353290 Exudative age-related macular degeneration, unspecified eye, stage unspecified: Secondary | ICD-10-CM

## 2013-02-18 ENCOUNTER — Other Ambulatory Visit: Payer: Self-pay | Admitting: Dermatology

## 2013-03-04 ENCOUNTER — Encounter: Payer: Self-pay | Admitting: Family Medicine

## 2013-03-04 ENCOUNTER — Ambulatory Visit (INDEPENDENT_AMBULATORY_CARE_PROVIDER_SITE_OTHER): Payer: Medicare Other | Admitting: Family Medicine

## 2013-03-04 VITALS — BP 124/76 | HR 71 | Temp 98.3°F | Ht 71.0 in | Wt 188.5 lb

## 2013-03-04 DIAGNOSIS — C3492 Malignant neoplasm of unspecified part of left bronchus or lung: Secondary | ICD-10-CM

## 2013-03-04 DIAGNOSIS — J209 Acute bronchitis, unspecified: Secondary | ICD-10-CM

## 2013-03-04 DIAGNOSIS — Z87891 Personal history of nicotine dependence: Secondary | ICD-10-CM

## 2013-03-04 DIAGNOSIS — C3490 Malignant neoplasm of unspecified part of unspecified bronchus or lung: Secondary | ICD-10-CM

## 2013-03-04 DIAGNOSIS — F17201 Nicotine dependence, unspecified, in remission: Secondary | ICD-10-CM

## 2013-03-04 MED ORDER — DOXYCYCLINE HYCLATE 100 MG PO TABS: 100.0000 mg | ORAL_TABLET | Freq: Two times a day (BID) | ORAL | Status: DC

## 2013-03-04 NOTE — Progress Notes (Signed)
Pre-visit discussion using our clinic review tool. No additional management support is needed unless otherwise documented below in the visit note.  

## 2013-03-05 NOTE — Progress Notes (Signed)
Date:  03/04/2013   Name:  [name]   DOB:  1930-11-14   MRN:  [id number] Gender: male Age: 79 y.o.  Primary Physician:  Hannah Beat, MD   Chief Complaint: Cough   Subjective:   History of Present Illness:  [name] is a 78 y.o. pleasant patient who presents with the following:  H/o adenocarcinoma of the left lung s/p L UL lobectomy in 2001, recent pericarditis and coronary atherosclerosis presents with significant coughing, not feeling well. Cough productive of sputum, no fever. Cancer has been stable. AF now. No n/v/d. Primarily pulmonary in a former smoker.   Patient Active Problem List   Diagnosis Date Noted  . CAD (coronary artery disease)     Priority: High  . Coronary atherosclerosis of native coronary artery 09/17/2012    Priority: High  . Adenocarcinoma of left lung, stage 1 07/25/2011    Priority: High  . Cancer of prostate w/med recur risk (T2b-c or Gleason 7 or PSA 10-20) 07/25/2011    Priority: High  . Type 2 diabetes mellitus with vascular disease 05/21/2007    Priority: High  . Pericardial tamponade 09/17/2012    Priority: Medium  . Acute pericarditis, unspecified 09/13/2012    Priority: Medium  . HYPERCHOLESTEROLEMIA 05/21/2007    Priority: Medium  . HYPERTENSION 01/08/2007    Priority: Medium  . 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 Nonmelanoma skin cancer 07/25/2011  . History of colonic polyps 07/25/2011  . VERTIGO 07/22/2008  . CARCINOMA, SKIN, SQUAMOUS CELL 01/08/2007  . Generalized anxiety disorder 01/08/2007  . ALZHEIMER'S DISEASE, MILD 01/08/2007    Past Medical History  Diagnosis Date  . Unspecified essential hypertension   . Alzheimer's disease   . Squamous cell carcinoma of skin   . Prostate ca 04/2001    Gleason 7  S/P prostatectomy 04/11/01  . Nonmelanoma skin cancer 07/25/2011    Multiple lesions excised face/nose  . History of colonic polyps 07/25/2011  . CAD (coronary artery disease) 2014    a. 09/2012 Cath:  LM nl, LAD 50-60p, D1 60-70 m, D1 50ost, LCX nl, RCA min irregs, EF 55-65%.  . Pericarditis     a. 09/2012 with effusion and tamponade, s/p window.  b.  F/u Echo 09/19/12: mod LVH, EF 55%, Gr 1 DD, Tr MR, mild RVE, no residual effusion  . Type 2 diabetes mellitus with vascular disease 05/21/2007    Qualifier: Diagnosis of  By: Ermalene Searing MD, [name]    . Generalized anxiety disorder 01/08/2007    Qualifier: Diagnosis of  By: Ermalene Searing MD, [name]    . Adenocarcinoma of left lung, stage 1 2001    T1N0 stage I  adenoca left lung resected 01/03/00   . Tobacco abuse, 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/08/2013    Past Surgical History  Procedure Laterality Date  . Lobectomy  2001    upper left  . Prostatectomy  2003  . Hernia repair    . Tonsillectomy    . Subxyphoid pericardial window N/A 09/16/2012    Procedure: SUBXYPHOID PERICARDIAL WINDOW;  Surgeon: Purcell Nails, MD;  Location: [facility] OR;  Service: Thoracic;  Laterality: N/A;    History   Social History  . Marital Status: Married    Spouse Name: N/A    Number of Children: N/A  . Years of Education: N/A   Occupational History  . retired    Social History Main Topics  . Smoking status: Former Smoker    Quit date: 04/04/1984  .  Smokeless tobacco: Former Neurosurgeon     Comment: previously smoked 4 PPD; quit over 30 years  . Alcohol Use: No  . Drug Use: No  . Sexual Activity: Not on file   Other Topics Concern  . Not on file   Social History Narrative   Lives in [place], [place]   Regular exercise--no, mowing grass   Diet: fruit and veggies    Family History  Problem Relation Age of Onset  . Dementia Mother   . Diabetes Mother   . Cancer Father     colon  . Cancer Brother     lung    Allergies  Allergen Reactions  . Sulfa Antibiotics Nausea Only    Medication list has been reviewed and updated.  Review of Systems:   GEN: as above GI: No n/v/d, eating normally Pulm: cough, mild sob Interactive and getting along well at home.  Otherwise, ROS  is as per the HPI.  Objective:   Physical Examination: BP 124/76  Pulse 71  Temp(Src) 98.3 F (36.8 C) (Oral)  Ht 5\' 11"  (1.803 m)  Wt 188 lb 8 oz (85.503 kg)  BMI 26.30 kg/m2  SpO2 96%  Ideal Body Weight: Weight in (lb) to have BMI = 25: 178.9   GEN: A and O x 3. WDWN. NAD.    ENT: Nose clear, ext NML.  No LAD.  No JVD.  TM's clear. Oropharynx clear.  PULM: Normal WOB, no distress. More distant bs without focal crackles or wheezing CV: RRR, no M/G/R, No rubs, No JVD.   EXT: warm and well-perfused, No c/c/e. PSYCH: Pleasant and conversant.  Laboratory and Imaging Data:  Assessment & Plan:    Acute bronchitis  Adenocarcinoma of left lung, stage 1  Tobacco abuse, in remission  Acute bronchitis: discussed plan of care. Given length of symptoms and overall history, will treat with ABX in this case. Continue with additional supportive care, cough medications, liquids, sleep, steam / vaporizer. H/o lung ca  New medications, updates to list, dose adjustments: Meds ordered this encounter  Medications  . doxycycline (VIBRA-TABS) 100 MG tablet    Sig: Take 1 tablet (100 mg total) by mouth 2 (two) times daily.    Dispense:  20 tablet    Refill:  0    Signed,  Zyrah Wiswell T. Averly Ericson, MD, CAQ Sports Medicine  [facility] [address] [place] [address] Phone: [phone] Fax: [phone]  Updated Complete Medication List:   Medication List       This list is accurate as of: 03/04/13 11:59 PM.  Always use your most recent med list.               aspirin EC 81 MG tablet  Take 81 mg by mouth daily.     atorvastatin 40 MG tablet  Commonly known as:  LIPITOR  Take 40 mg by mouth daily.     donepezil 10 MG tablet  Commonly known as:  ARICEPT  Take 1 tablet (10 mg total) by mouth at bedtime.     doxycycline 100 MG tablet  Commonly known as:  VIBRA-TABS  Take 1 tablet (100 mg total) by mouth 2 (two) times daily.     Fish Oil 1000 MG  Caps  Take 1,000 mg by mouth at bedtime.     glipiZIDE 5 MG tablet  Commonly known as:  GLUCOTROL  Take 0.5 tablets (2.5 mg total) by mouth daily before breakfast.     ICAPS  LUTEIN & ZEAXANTHIN PO  Take 1 capsule by mouth 2 (two) times daily.     metFORMIN 500 MG 24 hr tablet  Commonly known as:  GLUCOPHAGE-XR  Take 500 mg by mouth 2 (two) times daily.     sertraline 50 MG tablet  Commonly known as:  ZOLOFT  Take 50 mg by mouth at bedtime.

## 2013-03-26 ENCOUNTER — Encounter (INDEPENDENT_AMBULATORY_CARE_PROVIDER_SITE_OTHER): Payer: Medicare Other | Admitting: Ophthalmology

## 2013-03-26 DIAGNOSIS — H43819 Vitreous degeneration, unspecified eye: Secondary | ICD-10-CM

## 2013-03-26 DIAGNOSIS — E11319 Type 2 diabetes mellitus with unspecified diabetic retinopathy without macular edema: Secondary | ICD-10-CM

## 2013-03-26 DIAGNOSIS — H3530 Unspecified macular degeneration: Secondary | ICD-10-CM

## 2013-03-26 DIAGNOSIS — H353290 Exudative age-related macular degeneration, unspecified eye, stage unspecified: Secondary | ICD-10-CM

## 2013-03-26 DIAGNOSIS — E1139 Type 2 diabetes mellitus with other diabetic ophthalmic complication: Secondary | ICD-10-CM

## 2013-03-26 DIAGNOSIS — H2510 Age-related nuclear cataract, unspecified eye: Secondary | ICD-10-CM

## 2013-04-16 ENCOUNTER — Other Ambulatory Visit: Payer: Self-pay | Admitting: Dermatology

## 2013-04-23 ENCOUNTER — Telehealth: Payer: Self-pay | Admitting: Neurology

## 2013-04-23 NOTE — Telephone Encounter (Signed)
Patient wants to be followed up with Dr. Jannifer Franklin who previously placed him on Aricept.  He was hospitalized in June of 2014 for heart problems. Wife would like a call back for advise.

## 2013-05-20 ENCOUNTER — Encounter (INDEPENDENT_AMBULATORY_CARE_PROVIDER_SITE_OTHER): Payer: Medicare HMO | Admitting: Ophthalmology

## 2013-05-20 DIAGNOSIS — H3530 Unspecified macular degeneration: Secondary | ICD-10-CM

## 2013-05-20 DIAGNOSIS — H43819 Vitreous degeneration, unspecified eye: Secondary | ICD-10-CM

## 2013-05-20 DIAGNOSIS — E1139 Type 2 diabetes mellitus with other diabetic ophthalmic complication: Secondary | ICD-10-CM

## 2013-05-20 DIAGNOSIS — E1165 Type 2 diabetes mellitus with hyperglycemia: Secondary | ICD-10-CM

## 2013-05-20 DIAGNOSIS — H35359 Cystoid macular degeneration, unspecified eye: Secondary | ICD-10-CM

## 2013-05-20 DIAGNOSIS — E11319 Type 2 diabetes mellitus with unspecified diabetic retinopathy without macular edema: Secondary | ICD-10-CM

## 2013-05-21 ENCOUNTER — Encounter (INDEPENDENT_AMBULATORY_CARE_PROVIDER_SITE_OTHER): Payer: Medicare Other | Admitting: Ophthalmology

## 2013-06-01 ENCOUNTER — Encounter: Payer: Self-pay | Admitting: Oncology

## 2013-06-01 ENCOUNTER — Telehealth: Payer: Self-pay | Admitting: *Deleted

## 2013-06-01 NOTE — Telephone Encounter (Signed)
Mailed provider change letter to pt w/ calendar and cancelled Dr. Synthia Innocent appt.

## 2013-06-24 ENCOUNTER — Other Ambulatory Visit: Payer: Self-pay | Admitting: Family Medicine

## 2013-07-12 ENCOUNTER — Telehealth: Payer: Self-pay | Admitting: Hematology & Oncology

## 2013-07-12 NOTE — Telephone Encounter (Signed)
Pt's wife called cx 4-21 with Dr. Julien Nordmann and scheduled 4-24 with Dr. Marin Olp they heard of him thru members of their church.. Pt aware to keep 4-14 appointments. Dr. Marin Olp aware.

## 2013-07-15 ENCOUNTER — Other Ambulatory Visit: Payer: Self-pay | Admitting: Nurse Practitioner

## 2013-07-15 DIAGNOSIS — C61 Malignant neoplasm of prostate: Secondary | ICD-10-CM

## 2013-07-16 ENCOUNTER — Other Ambulatory Visit: Payer: Self-pay | Admitting: Oncology

## 2013-07-16 ENCOUNTER — Other Ambulatory Visit (HOSPITAL_BASED_OUTPATIENT_CLINIC_OR_DEPARTMENT_OTHER): Payer: Medicare HMO

## 2013-07-16 ENCOUNTER — Ambulatory Visit (HOSPITAL_COMMUNITY)
Admission: RE | Admit: 2013-07-16 | Discharge: 2013-07-16 | Disposition: A | Discharge status: Home / Self Care | Payer: Medicare HMO | Source: Ambulatory Visit | Attending: Oncology | Admitting: Oncology

## 2013-07-16 DIAGNOSIS — C4490 Unspecified malignant neoplasm of skin, unspecified: Secondary | ICD-10-CM

## 2013-07-16 DIAGNOSIS — C61 Malignant neoplasm of prostate: Secondary | ICD-10-CM

## 2013-07-16 DIAGNOSIS — C3410 Malignant neoplasm of upper lobe, unspecified bronchus or lung: Secondary | ICD-10-CM

## 2013-07-16 DIAGNOSIS — C3490 Malignant neoplasm of unspecified part of unspecified bronchus or lung: Secondary | ICD-10-CM | POA: Insufficient documentation

## 2013-07-16 LAB — CBC WITH DIFFERENTIAL/PLATELET
BASO%: 0.1 % (ref 0.0–2.0)
Basophils Absolute: 0 10*3/uL (ref 0.0–0.1)
EOS%: 2 % (ref 0.0–7.0)
Eosinophils Absolute: 0.1 10*3/uL (ref 0.0–0.5)
HCT: 39.6 % (ref 38.4–49.9)
HGB: 12.9 g/dL — ABNORMAL LOW (ref 13.0–17.1)
LYMPH%: 11.7 % — ABNORMAL LOW (ref 14.0–49.0)
MCH: 31.1 pg (ref 27.2–33.4)
MCHC: 32.6 g/dL (ref 32.0–36.0)
MCV: 95.4 fL (ref 79.3–98.0)
MONO#: 0.7 10*3/uL (ref 0.1–0.9)
MONO%: 9.6 % (ref 0.0–14.0)
NEUT#: 5.5 10*3/uL (ref 1.5–6.5)
NEUT%: 76.6 % — ABNORMAL HIGH (ref 39.0–75.0)
Platelets: 142 10*3/uL (ref 140–400)
RBC: 4.15 10*6/uL — ABNORMAL LOW (ref 4.20–5.82)
RDW: 12.6 % (ref 11.0–14.6)
WBC: 7.1 10*3/uL (ref 4.0–10.3)
lymph#: 0.8 10*3/uL — ABNORMAL LOW (ref 0.9–3.3)

## 2013-07-16 LAB — COMPREHENSIVE METABOLIC PANEL (CC13)
ALT: 9 U/L (ref 0–55)
AST: 11 U/L (ref 5–34)
Albumin: 3.7 g/dL (ref 3.5–5.0)
Alkaline Phosphatase: 97 U/L (ref 40–150)
Anion Gap: 8 mEq/L (ref 3–11)
BUN: 15 mg/dL (ref 7.0–26.0)
CO2: 28 mEq/L (ref 22–29)
Calcium: 9.5 mg/dL (ref 8.4–10.4)
Chloride: 107 mEq/L (ref 98–109)
Creatinine: 1.2 mg/dL (ref 0.7–1.3)
Glucose: 199 mg/dl — ABNORMAL HIGH (ref 70–140)
Potassium: 5.1 mEq/L (ref 3.5–5.1)
Sodium: 142 mEq/L (ref 136–145)
Total Bilirubin: 0.38 mg/dL (ref 0.20–1.20)
Total Protein: 6.7 g/dL (ref 6.4–8.3)

## 2013-07-17 ENCOUNTER — Encounter: Payer: Self-pay | Admitting: *Deleted

## 2013-07-17 ENCOUNTER — Telehealth: Payer: Self-pay | Admitting: *Deleted

## 2013-07-17 LAB — PSA: PSA: <0.01 ng/mL (ref <=4.00)

## 2013-07-17 NOTE — Telephone Encounter (Signed)
Pt wife returned call & informed of negative CXR & reminded to keep appt next week with Dr Marin Olp.  She states they plan to see him.

## 2013-07-17 NOTE — Telephone Encounter (Signed)
Message copied by Jesse Fall on Wed Jul 17, 2013 11:43 AM ------      Message from: Annia Belt      Created: Tue Jul 16, 2013  2:35 PM       Call pt: CXR negative keep appt w Dr Marin Olp for next week ------

## 2013-07-23 ENCOUNTER — Ambulatory Visit: Payer: Medicare Other | Admitting: Oncology

## 2013-07-23 ENCOUNTER — Ambulatory Visit: Payer: Medicare Other | Admitting: Internal Medicine

## 2013-07-26 ENCOUNTER — Encounter: Payer: Self-pay | Admitting: Hematology & Oncology

## 2013-07-26 ENCOUNTER — Ambulatory Visit (HOSPITAL_BASED_OUTPATIENT_CLINIC_OR_DEPARTMENT_OTHER): Payer: Medicare HMO | Admitting: Hematology & Oncology

## 2013-07-26 VITALS — BP 146/69 | HR 67 | Temp 97.7°F | Resp 18 | Ht 70.0 in | Wt 193.0 lb

## 2013-07-26 DIAGNOSIS — C3492 Malignant neoplasm of unspecified part of left bronchus or lung: Secondary | ICD-10-CM

## 2013-07-26 DIAGNOSIS — C3410 Malignant neoplasm of upper lobe, unspecified bronchus or lung: Secondary | ICD-10-CM

## 2013-07-26 DIAGNOSIS — Z8546 Personal history of malignant neoplasm of prostate: Secondary | ICD-10-CM

## 2013-07-26 DIAGNOSIS — C61 Malignant neoplasm of prostate: Secondary | ICD-10-CM

## 2013-07-29 ENCOUNTER — Encounter (INDEPENDENT_AMBULATORY_CARE_PROVIDER_SITE_OTHER): Payer: Medicare HMO | Admitting: Ophthalmology

## 2013-07-29 DIAGNOSIS — E1139 Type 2 diabetes mellitus with other diabetic ophthalmic complication: Secondary | ICD-10-CM

## 2013-07-29 DIAGNOSIS — E1165 Type 2 diabetes mellitus with hyperglycemia: Secondary | ICD-10-CM

## 2013-07-29 DIAGNOSIS — E11319 Type 2 diabetes mellitus with unspecified diabetic retinopathy without macular edema: Secondary | ICD-10-CM

## 2013-07-29 DIAGNOSIS — H43819 Vitreous degeneration, unspecified eye: Secondary | ICD-10-CM

## 2013-07-29 DIAGNOSIS — H2510 Age-related nuclear cataract, unspecified eye: Secondary | ICD-10-CM

## 2013-07-29 DIAGNOSIS — H353290 Exudative age-related macular degeneration, unspecified eye, stage unspecified: Secondary | ICD-10-CM

## 2013-07-30 NOTE — Progress Notes (Signed)
Hematology and Oncology Follow Up Visit  [name] [id number] 07-04-1930 80 y.o. 07/30/2013   Principle Diagnosis:   Stage I adenocarcinoma the left lung-2001   Early stage prostate cancer-Gleason grade 7  Squamous cell carcinoma of the skin  Current Therapy:    Observation     Interim History:  Mr.  Castillo is in for his first office visit. He was followed by Dr. Beryle Beams. He now is being seen by our office. He's doing okay. He does have multiple medical problems. He does have some dementia. He does have diabetes.  He's had no problems with lung cancer recurrence. The chest x-ray done last week. This did not show any evidence of recurrent disease or new disease.  His last PSA done on April 14 was less than 0.01.  Medications: Current outpatient prescriptions:aspirin EC 81 MG tablet, Take 81 mg by mouth daily., Disp: , Rfl: ;  atorvastatin (LIPITOR) 40 MG tablet, Take 40 mg by mouth daily., Disp: , Rfl: ;  donepezil (ARICEPT) 10 MG tablet, Take 1 tablet (10 mg total) by mouth at bedtime., Disp: 90 tablet, Rfl: 1;  glipiZIDE (GLUCOTROL) 5 MG tablet, TAKE 1/2 TABLET BY MOUTH EVERY MORNING. BEFORE BREAKFAST, Disp: 15 tablet, Rfl: 5 metFORMIN (GLUCOPHAGE-XR) 500 MG 24 hr tablet, Take 500 mg by mouth 2 (two) times daily., Disp: , Rfl: ;  Multiple Vitamins-Minerals (ICAPS LUTEIN & ZEAXANTHIN PO), Take 1 capsule by mouth 2 (two) times daily. , Disp: , Rfl: ;  Omega-3 Fatty Acids (FISH OIL) 1000 MG CAPS, Take 1,000 mg by mouth at bedtime., Disp: , Rfl: ;  sertraline (ZOLOFT) 50 MG tablet, TAKE ONE (1) TABLET BY MOUTH EVERY DAY, Disp: 90 tablet, Rfl: 0  Allergies:  Allergies  Allergen Reactions  . Sulfa Antibiotics Nausea Only    Past Medical History, Surgical history, Social history, and Family History were reviewed and updated.  Review of Systems: As above  Physical Exam:  height is 5\' 10"  (1.778 m) and weight is 193 lb (87.544 kg). His oral temperature is 97.7 F (36.5 C).  His blood pressure is 146/69 and his pulse is 67. His respiration is 18.   Elderly gentleman. Lungs are clear. Cardiac exam regular in and with an occasional is to be. Abdomen soft. Has good bowel sounds. There is no fluid wave. There is a palpable liver or spleen tip. Back exam no tenderness over the spine ribs or hips. His thoracotomy scar in the right lateral chest wall. Extremities shows no clubbing cyanosis or edema. Skin exam shows actinic keratosis. The review likely squamous cell or basal cell carcinomas.  Lab Results  Component Value Date   WBC 7.1 07/16/2013   HGB 12.9* 07/16/2013   HCT 39.6 07/16/2013   MCV 95.4 07/16/2013   PLT 142 07/16/2013     Chemistry      Component Value Date/Time   NA 142 07/16/2013 1059   NA 137 02/06/2013 1203   K 5.1 07/16/2013 1059   K 5.5* 02/06/2013 1203   CL 103 02/06/2013 1203   CL 103 07/20/2012 1046   CO2 28 07/16/2013 1059   CO2 29 02/06/2013 1203   BUN 15.0 07/16/2013 1059   BUN 13 02/06/2013 1203   CREATININE 1.2 07/16/2013 1059   CREATININE 1.1 02/06/2013 1203   CREATININE 1.13 08/03/2012 1524      Component Value Date/Time   CALCIUM 9.5 07/16/2013 1059   CALCIUM 9.2 02/06/2013 1203   ALKPHOS 97 07/16/2013 1059   ALKPHOS 76  09/18/2012 0455   AST 11 07/16/2013 1059   AST 16 09/18/2012 0455   ALT 9 07/16/2013 1059   ALT 16 09/18/2012 0455   BILITOT 0.38 07/16/2013 1059   BILITOT 0.3 09/18/2012 0455         Impression and Plan: [name] is an 80 year old gentleman. He has separate malignancies. This fluid does seem to be a problem right now. He has other issues that are probably more important right now.  His labs look okay.  We will plan for another yearly followup. I don't see to have to get him back any sooner.   Volanda Napoleon, MD 4/28/20156:12 AM

## 2013-08-29 ENCOUNTER — Encounter (INDEPENDENT_AMBULATORY_CARE_PROVIDER_SITE_OTHER): Payer: Medicare HMO | Admitting: Ophthalmology

## 2013-08-29 DIAGNOSIS — H43819 Vitreous degeneration, unspecified eye: Secondary | ICD-10-CM

## 2013-08-29 DIAGNOSIS — H2510 Age-related nuclear cataract, unspecified eye: Secondary | ICD-10-CM

## 2013-08-29 DIAGNOSIS — E11319 Type 2 diabetes mellitus with unspecified diabetic retinopathy without macular edema: Secondary | ICD-10-CM

## 2013-08-29 DIAGNOSIS — H353290 Exudative age-related macular degeneration, unspecified eye, stage unspecified: Secondary | ICD-10-CM

## 2013-08-29 DIAGNOSIS — E1165 Type 2 diabetes mellitus with hyperglycemia: Secondary | ICD-10-CM

## 2013-08-29 DIAGNOSIS — E1139 Type 2 diabetes mellitus with other diabetic ophthalmic complication: Secondary | ICD-10-CM

## 2013-09-26 ENCOUNTER — Encounter (INDEPENDENT_AMBULATORY_CARE_PROVIDER_SITE_OTHER): Payer: Medicare HMO | Admitting: Ophthalmology

## 2013-09-26 DIAGNOSIS — H43819 Vitreous degeneration, unspecified eye: Secondary | ICD-10-CM

## 2013-09-26 DIAGNOSIS — I10 Essential (primary) hypertension: Secondary | ICD-10-CM

## 2013-09-26 DIAGNOSIS — H353290 Exudative age-related macular degeneration, unspecified eye, stage unspecified: Secondary | ICD-10-CM

## 2013-09-26 DIAGNOSIS — H2510 Age-related nuclear cataract, unspecified eye: Secondary | ICD-10-CM

## 2013-09-26 DIAGNOSIS — H35039 Hypertensive retinopathy, unspecified eye: Secondary | ICD-10-CM

## 2013-10-03 ENCOUNTER — Other Ambulatory Visit: Payer: Self-pay | Admitting: Family Medicine

## 2013-10-05 ENCOUNTER — Other Ambulatory Visit: Payer: Self-pay | Admitting: Family Medicine

## 2013-10-24 ENCOUNTER — Encounter (INDEPENDENT_AMBULATORY_CARE_PROVIDER_SITE_OTHER): Payer: Medicare HMO | Admitting: Ophthalmology

## 2013-10-24 DIAGNOSIS — E11319 Type 2 diabetes mellitus with unspecified diabetic retinopathy without macular edema: Secondary | ICD-10-CM

## 2013-10-24 DIAGNOSIS — E1139 Type 2 diabetes mellitus with other diabetic ophthalmic complication: Secondary | ICD-10-CM

## 2013-10-24 DIAGNOSIS — E1165 Type 2 diabetes mellitus with hyperglycemia: Secondary | ICD-10-CM

## 2013-10-24 DIAGNOSIS — H43819 Vitreous degeneration, unspecified eye: Secondary | ICD-10-CM

## 2013-10-24 DIAGNOSIS — H353290 Exudative age-related macular degeneration, unspecified eye, stage unspecified: Secondary | ICD-10-CM

## 2013-10-24 DIAGNOSIS — H2510 Age-related nuclear cataract, unspecified eye: Secondary | ICD-10-CM

## 2013-11-21 ENCOUNTER — Encounter (INDEPENDENT_AMBULATORY_CARE_PROVIDER_SITE_OTHER): Payer: Medicare HMO | Admitting: Ophthalmology

## 2013-11-21 DIAGNOSIS — E11319 Type 2 diabetes mellitus with unspecified diabetic retinopathy without macular edema: Secondary | ICD-10-CM

## 2013-11-21 DIAGNOSIS — H2510 Age-related nuclear cataract, unspecified eye: Secondary | ICD-10-CM

## 2013-11-21 DIAGNOSIS — H43819 Vitreous degeneration, unspecified eye: Secondary | ICD-10-CM

## 2013-11-21 DIAGNOSIS — H353290 Exudative age-related macular degeneration, unspecified eye, stage unspecified: Secondary | ICD-10-CM

## 2013-11-21 DIAGNOSIS — E1139 Type 2 diabetes mellitus with other diabetic ophthalmic complication: Secondary | ICD-10-CM

## 2013-11-21 DIAGNOSIS — E1165 Type 2 diabetes mellitus with hyperglycemia: Secondary | ICD-10-CM

## 2013-11-29 NOTE — Telephone Encounter (Signed)
Noted  

## 2013-12-19 ENCOUNTER — Encounter (INDEPENDENT_AMBULATORY_CARE_PROVIDER_SITE_OTHER): Payer: Medicare HMO | Admitting: Ophthalmology

## 2013-12-19 DIAGNOSIS — H2510 Age-related nuclear cataract, unspecified eye: Secondary | ICD-10-CM

## 2013-12-19 DIAGNOSIS — H353290 Exudative age-related macular degeneration, unspecified eye, stage unspecified: Secondary | ICD-10-CM

## 2013-12-19 DIAGNOSIS — E11319 Type 2 diabetes mellitus with unspecified diabetic retinopathy without macular edema: Secondary | ICD-10-CM

## 2013-12-19 DIAGNOSIS — H43819 Vitreous degeneration, unspecified eye: Secondary | ICD-10-CM

## 2013-12-19 DIAGNOSIS — E1139 Type 2 diabetes mellitus with other diabetic ophthalmic complication: Secondary | ICD-10-CM

## 2013-12-19 DIAGNOSIS — E1165 Type 2 diabetes mellitus with hyperglycemia: Secondary | ICD-10-CM

## 2014-01-10 ENCOUNTER — Other Ambulatory Visit: Payer: Self-pay | Admitting: Family Medicine

## 2014-01-20 ENCOUNTER — Ambulatory Visit: Payer: Medicare HMO | Admitting: Family Medicine

## 2014-01-20 ENCOUNTER — Ambulatory Visit (INDEPENDENT_AMBULATORY_CARE_PROVIDER_SITE_OTHER): Payer: Medicare HMO | Admitting: Family Medicine

## 2014-01-20 ENCOUNTER — Encounter: Payer: Self-pay | Admitting: Family Medicine

## 2014-01-20 VITALS — BP 132/72 | HR 76 | Temp 98.0°F | Ht 71.0 in | Wt 195.8 lb

## 2014-01-20 DIAGNOSIS — I2510 Atherosclerotic heart disease of native coronary artery without angina pectoris: Secondary | ICD-10-CM

## 2014-01-20 DIAGNOSIS — C61 Malignant neoplasm of prostate: Secondary | ICD-10-CM

## 2014-01-20 DIAGNOSIS — Z79899 Other long term (current) drug therapy: Secondary | ICD-10-CM

## 2014-01-20 DIAGNOSIS — E1159 Type 2 diabetes mellitus with other circulatory complications: Secondary | ICD-10-CM

## 2014-01-20 DIAGNOSIS — E7800 Pure hypercholesterolemia, unspecified: Secondary | ICD-10-CM

## 2014-01-20 DIAGNOSIS — E1151 Type 2 diabetes mellitus with diabetic peripheral angiopathy without gangrene: Secondary | ICD-10-CM

## 2014-01-20 DIAGNOSIS — I10 Essential (primary) hypertension: Secondary | ICD-10-CM

## 2014-01-20 DIAGNOSIS — G309 Alzheimer's disease, unspecified: Secondary | ICD-10-CM

## 2014-01-20 DIAGNOSIS — F0280 Dementia in other diseases classified elsewhere without behavioral disturbance: Secondary | ICD-10-CM

## 2014-01-20 DIAGNOSIS — Z23 Encounter for immunization: Secondary | ICD-10-CM

## 2014-01-20 DIAGNOSIS — H3530 Unspecified macular degeneration: Secondary | ICD-10-CM

## 2014-01-20 LAB — HEPATIC FUNCTION PANEL
ALT: 14 U/L (ref 0–53)
AST: 16 U/L (ref 0–37)
Albumin: 3.6 g/dL (ref 3.5–5.2)
Alkaline Phosphatase: 77 U/L (ref 39–117)
Bilirubin, Direct: 0 mg/dL (ref 0.0–0.3)
Total Bilirubin: 0.4 mg/dL (ref 0.2–1.2)
Total Protein: 7.3 g/dL (ref 6.0–8.3)

## 2014-01-20 LAB — BASIC METABOLIC PANEL
BUN: 16 mg/dL (ref 6–23)
CO2: 28 mEq/L (ref 19–32)
Calcium: 9.2 mg/dL (ref 8.4–10.5)
Chloride: 103 mEq/L (ref 96–112)
Creatinine, Ser: 1.3 mg/dL (ref 0.4–1.5)
GFR: 57.81 mL/min — ABNORMAL LOW (ref >60.00)
Glucose, Bld: 182 mg/dL — ABNORMAL HIGH (ref 70–99)
Potassium: 5.4 mEq/L — ABNORMAL HIGH (ref 3.5–5.1)
Sodium: 140 mEq/L (ref 135–145)

## 2014-01-20 LAB — CBC WITH DIFFERENTIAL/PLATELET
Basophils Absolute: 0 10*3/uL (ref 0.0–0.1)
Basophils Relative: 0.2 % (ref 0.0–3.0)
Eosinophils Absolute: 0.2 10*3/uL (ref 0.0–0.7)
Eosinophils Relative: 1.8 % (ref 0.0–5.0)
HCT: 41.1 % (ref 39.0–52.0)
Hemoglobin: 13.8 g/dL (ref 13.0–17.0)
Lymphocytes Relative: 10.3 % — ABNORMAL LOW (ref 12.0–46.0)
Lymphs Abs: 0.9 10*3/uL (ref 0.7–4.0)
MCHC: 33.5 g/dL (ref 30.0–36.0)
MCV: 94.7 fl (ref 78.0–100.0)
Monocytes Absolute: 0.7 10*3/uL (ref 0.1–1.0)
Monocytes Relative: 8.4 % (ref 3.0–12.0)
Neutro Abs: 6.9 10*3/uL (ref 1.4–7.7)
Neutrophils Relative %: 79.3 % — ABNORMAL HIGH (ref 43.0–77.0)
Platelets: 170 10*3/uL (ref 150.0–400.0)
RBC: 4.34 Mil/uL (ref 4.22–5.81)
RDW: 13 % (ref 11.5–15.5)
WBC: 8.7 10*3/uL (ref 4.0–10.5)

## 2014-01-20 LAB — MICROALBUMIN / CREATININE URINE RATIO
Creatinine,U: 232.8 mg/dL
Microalb Creat Ratio: 0.3 mg/g (ref 0.0–30.0)
Microalb, Ur: 0.8 mg/dL (ref 0.0–1.9)

## 2014-01-20 LAB — HEMOGLOBIN A1C: Hgb A1c MFr Bld: 7.5 % — ABNORMAL HIGH (ref 4.6–6.5)

## 2014-01-20 MED ORDER — TRIAMCINOLONE ACETONIDE 0.1 % EX CREA: 1.0000 "application " | TOPICAL_CREAM | Freq: Two times a day (BID) | CUTANEOUS | Status: DC

## 2014-01-20 NOTE — Progress Notes (Signed)
Pre visit review using our clinic review tool, if applicable. No additional management support is needed unless otherwise documented below in the visit note. 

## 2014-01-20 NOTE — Progress Notes (Signed)
Dr. Frederico Hamman T. Donye Campanelli, MD, [facility] [place], [address] Phone: [phone] Fax: [phone]  01/20/2014  Patient: [name], MRN: [id number], DOB: 1930-12-21, 80 y.o.  Primary Physician:  [name], MD  Chief Complaint: Diabetes  Subjective:   [name] is a 80 y.o. very pleasant male patient who presents with the following:  F/u, generally doing well.  Diabetes Mellitus: Tolerating Medications: yes Compliance with diet: fair Exercise: minimal / intermittent Avg blood sugars at home: not checking Foot problems: none Hypoglycemia: none No nausea, vomitting, blurred vision, polyuria.  Lab Results  Component Value Date   HGBA1C 7.5* 01/20/2014   HGBA1C 7.8* 02/06/2013   HGBA1C 7.4* 11/02/2012   Lab Results  Component Value Date   MICROALBUR 0.8 01/20/2014   LDLCALC 58 02/06/2013   CREATININE 1.3 01/20/2014    Wt Readings from Last 3 Encounters:  01/20/14 195 lb 12 oz (88.792 kg)  07/26/13 193 lb (87.544 kg)  03/04/13 188 lb 8 oz (85.503 kg)    Body mass index is 27.31 kg/(m^2).   HTN: Tolerating all medications without side effects Stable and at goal No CP, no sob. No HA.  BP Readings from Last 3 Encounters:  01/20/14 132/72  07/26/13 146/69  03/04/13 371/69    Basic Metabolic Panel:    Component Value Date/Time   NA 140 01/20/2014 1450   NA 142 07/16/2013 1059   K 5.4* 01/20/2014 1450   K 5.1 07/16/2013 1059   CL 103 01/20/2014 1450   CL 103 07/20/2012 1046   CO2 28 01/20/2014 1450   CO2 28 07/16/2013 1059   BUN 16 01/20/2014 1450   BUN 15.0 07/16/2013 1059   CREATININE 1.3 01/20/2014 1450   CREATININE 1.2 07/16/2013 1059   CREATININE 1.13 08/03/2012 1524   GLUCOSE 182* 01/20/2014 1450   GLUCOSE 199* 07/16/2013 1059   GLUCOSE 215* 07/20/2012 1046   CALCIUM 9.2 01/20/2014 1450   CALCIUM 9.5 07/16/2013 1059   Lipids: Doing well, stable. Tolerating meds fine with no  SE. Panel reviewed with patient.  Lipids:    Component Value Date/Time   CHOL 131 02/06/2013 1203   TRIG 174.0* 02/06/2013 1203   HDL 37.90* 02/06/2013 1203   LDLDIRECT 130.0 08/06/2010 0856   VLDL 34.8 02/06/2013 1203   CHOLHDL 3 02/06/2013 1203    Lab Results  Component Value Date   ALT 14 01/20/2014   AST 16 01/20/2014   ALKPHOS 77 01/20/2014   BILITOT 0.4 01/20/2014    Compliant with all his meds  Memory is starting to get worse.  Past Medical History, Surgical History, Social History, Family History, Problem List, Medications, and Allergies have been reviewed and updated if relevant.   GEN: No acute illnesses, no fevers, chills. GI: No n/v/d, eating normally Pulm: No SOB Interactive and getting along well at home.  Otherwise, ROS is as per the HPI.  Objective:   BP 132/72  Pulse 76  Temp(Src) 98 F (36.7 C) (Oral)  Ht 5\' 11"  (1.803 m)  Wt 195 lb 12 oz (88.792 kg)  BMI 27.31 kg/m2  SpO2 98%  GEN: WDWN, NAD, Non-toxic, A & O x 3 HEENT: Atraumatic, Normocephalic. Neck supple. No masses, No LAD. Ears and Nose: No external deformity. CV: RRR, No M/G/R. No JVD. No thrill. No extra heart sounds. PULM: CTA B, no wheezes, crackles, rhonchi. No retractions. No resp. distress. No accessory muscle use. EXTR: No c/c/e NEURO  Normal gait.  PSYCH: Normally interactive. Conversant. Not depressed or anxious appearing.  Calm demeanor.   Laboratory and Imaging Data: Results for orders placed in visit on 17/61/60  BASIC METABOLIC PANEL      Result Value Ref Range   Sodium 140  135 - 145 mEq/L   Potassium 5.4 (*) 3.5 - 5.1 mEq/L   Chloride 103  96 - 112 mEq/L   CO2 28  19 - 32 mEq/L   Glucose, Bld 182 (*) 70 - 99 mg/dL   BUN 16  6 - 23 mg/dL   Creatinine, Ser 1.3  0.4 - 1.5 mg/dL   Calcium 9.2  8.4 - 10.5 mg/dL   GFR 57.81 (*) >60.00 mL/min  CBC WITH DIFFERENTIAL      Result Value Ref Range   WBC 8.7  4.0 - 10.5 K/uL   RBC 4.34  4.22 - 5.81 Mil/uL   Hemoglobin 13.8   13.0 - 17.0 g/dL   HCT 41.1  39.0 - 52.0 %   MCV 94.7  78.0 - 100.0 fl   MCHC 33.5  30.0 - 36.0 g/dL   RDW 13.0  11.5 - 15.5 %   Platelets 170.0  150.0 - 400.0 K/uL   Neutrophils Relative % 79.3 (*) 43.0 - 77.0 %   Lymphocytes Relative 10.3 (*) 12.0 - 46.0 %   Monocytes Relative 8.4  3.0 - 12.0 %   Eosinophils Relative 1.8  0.0 - 5.0 %   Basophils Relative 0.2  0.0 - 3.0 %   Neutro Abs 6.9  1.4 - 7.7 K/uL   Lymphs Abs 0.9  0.7 - 4.0 K/uL   Monocytes Absolute 0.7  0.1 - 1.0 K/uL   Eosinophils Absolute 0.2  0.0 - 0.7 K/uL   Basophils Absolute 0.0  0.0 - 0.1 K/uL  HEPATIC FUNCTION PANEL      Result Value Ref Range   Total Bilirubin 0.4  0.2 - 1.2 mg/dL   Bilirubin, Direct 0.0  0.0 - 0.3 mg/dL   Alkaline Phosphatase 77  39 - 117 U/L   AST 16  0 - 37 U/L   ALT 14  0 - 53 U/L   Total Protein 7.3  6.0 - 8.3 g/dL   Albumin 3.6  3.5 - 5.2 g/dL  HEMOGLOBIN A1C      Result Value Ref Range   Hemoglobin A1C 7.5 (*) 4.6 - 6.5 %  MICROALBUMIN / CREATININE URINE RATIO      Result Value Ref Range   Microalb, Ur 0.8  0.0 - 1.9 mg/dL   Creatinine,U 232.8     Microalb Creat Ratio 0.3  0.0 - 30.0 mg/g     Assessment and Plan:   Type 2 diabetes mellitus with vascular disease - Plan: Basic metabolic panel, Hemoglobin A1c, Microalbumin / creatinine urine ratio Increase glipizide dosing to 1 full tablet  Essential hypertension  HYPERCHOLESTEROLEMIA  Long-term use of high-risk medication - Plan: CBC with Differential, Hepatic function panel  Need for prophylactic vaccination against Streptococcus pneumoniae (pneumococcus) - Plan: Pneumococcal conjugate vaccine 13-valent  Need for prophylactic vaccination and inoculation against influenza - Plan: Flu Vaccine QUAD 36+ mos IM  Atherosclerosis of native coronary artery of native heart without angina pectoris  Coronary artery disease involving native coronary artery of native heart without angina pectoris  Cancer of prostate w/med recur risk  (T2b-c or Gleason 7 or PSA 10-20)  ALZHEIMER'S DISEASE, MILD  Macular degeneration of both eyes  Rash on lower legs: TAC   Follow-up:  No Follow-up on file.  New Prescriptions   TRIAMCINOLONE CREAM (KENALOG) 0.1 %    Apply 1 application topically 2 (two) times daily.   Orders Placed This Encounter  Procedures  . Flu Vaccine QUAD 36+ mos IM  . Pneumococcal conjugate vaccine 13-valent  . Basic metabolic panel  . CBC with Differential  . Hepatic function panel  . Hemoglobin A1c  . Microalbumin / creatinine urine ratio    Signed,  Myquan Schaumburg T. Nuha Degner, MD   Patient's Medications  New Prescriptions   TRIAMCINOLONE CREAM (KENALOG) 0.1 %    Apply 1 application topically 2 (two) times daily.  Previous Medications   ASPIRIN EC 81 MG TABLET    Take 81 mg by mouth daily.   ATORVASTATIN (LIPITOR) 40 MG TABLET    Take 40 mg by mouth daily.   DONEPEZIL (ARICEPT) 10 MG TABLET    TAKE 1 TABLET AT BEDTIME   GLIPIZIDE (GLUCOTROL) 5 MG TABLET    TAKE 1/2 TABLET BY MOUTH IN THE MORNING BEFORE BREAKFAST   METFORMIN (GLUCOPHAGE-XR) 500 MG 24 HR TABLET    Take 500 mg by mouth 2 (two) times daily.   MULTIPLE VITAMINS-MINERALS (ICAPS LUTEIN & ZEAXANTHIN PO)    Take 1 capsule by mouth 2 (two) times daily.    OMEGA-3 FATTY ACIDS (FISH OIL) 1000 MG CAPS    Take 1,000 mg by mouth at bedtime.   SERTRALINE (ZOLOFT) 50 MG TABLET    TAKE 1 TABLET BY MOUTH DAILY  Modified Medications   No medications on file  Discontinued Medications   No medications on file

## 2014-01-21 ENCOUNTER — Encounter: Payer: Self-pay | Admitting: Family Medicine

## 2014-01-21 DIAGNOSIS — H3530 Unspecified macular degeneration: Secondary | ICD-10-CM

## 2014-01-21 HISTORY — DX: Unspecified macular degeneration: H35.30

## 2014-01-21 MED ORDER — GLIPIZIDE 5 MG PO TABS: 5.0000 mg | ORAL_TABLET | Freq: Every day | ORAL | Status: DC

## 2014-01-22 ENCOUNTER — Encounter (INDEPENDENT_AMBULATORY_CARE_PROVIDER_SITE_OTHER): Payer: Medicare HMO | Admitting: Ophthalmology

## 2014-01-22 DIAGNOSIS — E11329 Type 2 diabetes mellitus with mild nonproliferative diabetic retinopathy without macular edema: Secondary | ICD-10-CM

## 2014-01-22 DIAGNOSIS — H43813 Vitreous degeneration, bilateral: Secondary | ICD-10-CM

## 2014-01-22 DIAGNOSIS — H2513 Age-related nuclear cataract, bilateral: Secondary | ICD-10-CM

## 2014-01-22 DIAGNOSIS — E11319 Type 2 diabetes mellitus with unspecified diabetic retinopathy without macular edema: Secondary | ICD-10-CM

## 2014-02-11 ENCOUNTER — Encounter: Payer: Self-pay | Admitting: Cardiovascular Disease

## 2014-02-11 ENCOUNTER — Ambulatory Visit (INDEPENDENT_AMBULATORY_CARE_PROVIDER_SITE_OTHER): Payer: Medicare HMO | Admitting: Cardiovascular Disease

## 2014-02-11 VITALS — BP 138/68 | HR 83 | Ht 71.0 in | Wt 195.0 lb

## 2014-02-11 DIAGNOSIS — I319 Disease of pericardium, unspecified: Secondary | ICD-10-CM

## 2014-02-11 DIAGNOSIS — I3139 Other pericardial effusion (noninflammatory): Secondary | ICD-10-CM

## 2014-02-11 DIAGNOSIS — I313 Pericardial effusion (noninflammatory): Secondary | ICD-10-CM

## 2014-02-11 NOTE — Patient Instructions (Signed)
Your physician recommends that you schedule a follow-up appointment as needed with Dr Burt Knack.   Your physician recommends that you continue on your current medications as directed. Please refer to the Current Medication list given to you today.

## 2014-02-11 NOTE — Progress Notes (Signed)
Background: the patient is followed for pericardial disease. He was hospitalized in 2014 with acute pericarditis complicated with pericardial tamponade. He required a subxiphoid window. Other medical problems include nonobstructive coronary artery disease, type 2 diabetes, essential hypertension, hyperlipidemia, lung cancer, and prostate cancer.  HPI:  80 year old gentleman presenting for follow-up evaluation.  The patient is doing well from a cardiac perspective. He denies chest pain, chest pressure, shortness of breath, leg swelling, lightheadedness, or heart palpitations.  Outpatient Encounter Prescriptions as of 02/11/2014  Medication Sig  . aspirin EC 81 MG tablet Take 81 mg by mouth daily.  Marland Kitchen atorvastatin (LIPITOR) 40 MG tablet Take 40 mg by mouth daily.  Marland Kitchen donepezil (ARICEPT) 10 MG tablet TAKE 1 TABLET AT BEDTIME  . glipiZIDE (GLUCOTROL) 5 MG tablet Take 1 tablet (5 mg total) by mouth daily before breakfast.  . metFORMIN (GLUCOPHAGE-XR) 500 MG 24 hr tablet Take 500 mg by mouth 2 (two) times daily.  . Multiple Vitamins-Minerals (ICAPS LUTEIN & ZEAXANTHIN PO) Take 1 capsule by mouth 2 (two) times daily.   . Omega-3 Fatty Acids (FISH OIL) 1000 MG CAPS Take 1,000 mg by mouth at bedtime.  . sertraline (ZOLOFT) 50 MG tablet TAKE 1 TABLET BY MOUTH DAILY  . triamcinolone cream (KENALOG) 0.1 % Apply 1 application topically 2 (two) times daily.    Allergies  Allergen Reactions  . Sulfa Antibiotics Nausea Only    Past Medical History  Diagnosis Date  . Unspecified essential hypertension   . Alzheimer's disease   . Squamous cell carcinoma of skin   . Prostate ca 04/2001    Gleason 7  S/P prostatectomy 04/11/01  . Nonmelanoma skin cancer 07/25/2011    Multiple lesions excised face/nose  . History of colonic polyps 07/25/2011  . CAD (coronary artery disease) 2014    a. 09/2012 Cath: LM nl, LAD 50-60p, D1 60-70 m, D1 50ost, LCX nl, RCA min irregs, EF 55-65%.  . Pericarditis     a. 09/2012  with effusion and tamponade, s/p window.  b.  F/u Echo 09/19/12: mod LVH, EF 55%, Gr 1 DD, Tr MR, mild RVE, no residual effusion  . Type 2 diabetes mellitus with vascular disease 05/21/2007    Qualifier: Diagnosis of  By: Diona Browner MD, [name]    . Generalized anxiety disorder 01/08/2007    Qualifier: Diagnosis of  By: Diona Browner MD, [name]    . Adenocarcinoma of left lung, stage 1 2001    T1N0 stage I  adenoca left lung resected 01/03/00   . 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 Macular degeneration of both eyes 01/21/2014    family history includes Cancer in his brother and father; Dementia in his mother; Diabetes in his mother.   ROS: Negative except as per HPI  BP 138/68 mmHg  Pulse 83  Ht 5\' 11"  (1.803 m)  Wt 195 lb (88.451 kg)  BMI 27.21 kg/m2  PHYSICAL EXAM: Pt is alert and oriented, was an elderly male in NAD HEENT: normal Neck: JVP - normal, carotids 2+= without bruits Lungs: CTA bilaterally CV: RRR without murmur or gallop Abd: soft, NT, Positive BS, no hepatomegaly Ext: no C/C/E, distal pulses intact and equal Skin: warm/dry no rash  EKG:  Normal sinus rhythm 83 bpm. Within normal limits.  ASSESSMENT AND PLAN: Pericardial disease with history of pericardial effusion in the setting of acute pericarditis. The patient is status post subxiphoid pericardial window. He has no residual symptoms. He appears to be quite stable from a cardiac perspective.  I will plan on seeing him back as needed.  Sherren Mocha, MD 02/11/2014 4:07 PM

## 2014-02-21 ENCOUNTER — Other Ambulatory Visit: Payer: Self-pay | Admitting: Family Medicine

## 2014-02-21 NOTE — Telephone Encounter (Signed)
Last office visit 01/20/2014.  Last Lipid Panel 02/06/2013.  Ok to refill?

## 2014-03-06 ENCOUNTER — Encounter (INDEPENDENT_AMBULATORY_CARE_PROVIDER_SITE_OTHER): Payer: Medicare HMO | Admitting: Ophthalmology

## 2014-03-06 DIAGNOSIS — E11319 Type 2 diabetes mellitus with unspecified diabetic retinopathy without macular edema: Secondary | ICD-10-CM

## 2014-03-06 DIAGNOSIS — H3532 Exudative age-related macular degeneration: Secondary | ICD-10-CM

## 2014-03-06 DIAGNOSIS — E11339 Type 2 diabetes mellitus with moderate nonproliferative diabetic retinopathy without macular edema: Secondary | ICD-10-CM

## 2014-03-06 DIAGNOSIS — H43813 Vitreous degeneration, bilateral: Secondary | ICD-10-CM

## 2014-03-06 DIAGNOSIS — H2513 Age-related nuclear cataract, bilateral: Secondary | ICD-10-CM

## 2014-03-13 ENCOUNTER — Encounter (HOSPITAL_COMMUNITY): Payer: Self-pay | Admitting: Cardiovascular Disease

## 2014-03-18 ENCOUNTER — Encounter (INDEPENDENT_AMBULATORY_CARE_PROVIDER_SITE_OTHER): Payer: Medicare HMO | Admitting: Ophthalmology

## 2014-03-18 DIAGNOSIS — E11329 Type 2 diabetes mellitus with mild nonproliferative diabetic retinopathy without macular edema: Secondary | ICD-10-CM

## 2014-03-18 DIAGNOSIS — H43813 Vitreous degeneration, bilateral: Secondary | ICD-10-CM

## 2014-03-18 DIAGNOSIS — H3532 Exudative age-related macular degeneration: Secondary | ICD-10-CM

## 2014-03-18 DIAGNOSIS — E11319 Type 2 diabetes mellitus with unspecified diabetic retinopathy without macular edema: Secondary | ICD-10-CM

## 2014-04-02 ENCOUNTER — Encounter (INDEPENDENT_AMBULATORY_CARE_PROVIDER_SITE_OTHER): Payer: Medicare HMO | Admitting: Ophthalmology

## 2014-04-02 DIAGNOSIS — E11319 Type 2 diabetes mellitus with unspecified diabetic retinopathy without macular edema: Secondary | ICD-10-CM

## 2014-04-02 DIAGNOSIS — H34813 Central retinal vein occlusion, bilateral: Secondary | ICD-10-CM

## 2014-04-02 DIAGNOSIS — E11329 Type 2 diabetes mellitus with mild nonproliferative diabetic retinopathy without macular edema: Secondary | ICD-10-CM

## 2014-04-02 DIAGNOSIS — H3532 Exudative age-related macular degeneration: Secondary | ICD-10-CM

## 2014-04-17 ENCOUNTER — Other Ambulatory Visit: Payer: Self-pay | Admitting: *Deleted

## 2014-04-17 MED ORDER — DONEPEZIL HCL 10 MG PO TABS: 10.0000 mg | ORAL_TABLET | Freq: Every day | ORAL | Status: DC

## 2014-04-17 MED ORDER — METFORMIN HCL ER 500 MG PO TB24: 500.0000 mg | ORAL_TABLET | Freq: Two times a day (BID) | ORAL | Status: DC

## 2014-05-01 ENCOUNTER — Encounter (INDEPENDENT_AMBULATORY_CARE_PROVIDER_SITE_OTHER): Payer: Medicare HMO | Admitting: Ophthalmology

## 2014-05-01 DIAGNOSIS — E11319 Type 2 diabetes mellitus with unspecified diabetic retinopathy without macular edema: Secondary | ICD-10-CM

## 2014-05-01 DIAGNOSIS — H3532 Exudative age-related macular degeneration: Secondary | ICD-10-CM

## 2014-05-01 DIAGNOSIS — E11329 Type 2 diabetes mellitus with mild nonproliferative diabetic retinopathy without macular edema: Secondary | ICD-10-CM

## 2014-05-01 DIAGNOSIS — H43813 Vitreous degeneration, bilateral: Secondary | ICD-10-CM

## 2014-05-02 ENCOUNTER — Encounter (INDEPENDENT_AMBULATORY_CARE_PROVIDER_SITE_OTHER): Payer: Medicare HMO | Admitting: Ophthalmology

## 2014-05-05 ENCOUNTER — Telehealth: Payer: Self-pay | Admitting: Internal Medicine

## 2014-05-05 NOTE — Telephone Encounter (Signed)
Last office visit 01/20/2014.  Last Lipid Panel 02/06/2013.  Ok to refill?

## 2014-05-05 NOTE — Telephone Encounter (Signed)
He should f/u around April or May for diabetes, cholesterol, etc.

## 2014-05-05 NOTE — Telephone Encounter (Signed)
Please call and scheduled [name] for office visit with fasting labs prior to follow up on his diabetes and cholesterol sometime in April or May.

## 2014-05-06 NOTE — Telephone Encounter (Signed)
Pt scheduled for follow up 07/16/14 Labs 07/09/14

## 2014-05-29 ENCOUNTER — Encounter (INDEPENDENT_AMBULATORY_CARE_PROVIDER_SITE_OTHER): Payer: Medicare HMO | Admitting: Ophthalmology

## 2014-05-29 DIAGNOSIS — H2511 Age-related nuclear cataract, right eye: Secondary | ICD-10-CM

## 2014-05-29 DIAGNOSIS — E11329 Type 2 diabetes mellitus with mild nonproliferative diabetic retinopathy without macular edema: Secondary | ICD-10-CM

## 2014-05-29 DIAGNOSIS — E11319 Type 2 diabetes mellitus with unspecified diabetic retinopathy without macular edema: Secondary | ICD-10-CM

## 2014-05-29 DIAGNOSIS — H3532 Exudative age-related macular degeneration: Secondary | ICD-10-CM

## 2014-05-29 DIAGNOSIS — H43813 Vitreous degeneration, bilateral: Secondary | ICD-10-CM

## 2014-07-09 ENCOUNTER — Encounter (INDEPENDENT_AMBULATORY_CARE_PROVIDER_SITE_OTHER): Payer: Medicare HMO | Admitting: Ophthalmology

## 2014-07-09 ENCOUNTER — Other Ambulatory Visit (INDEPENDENT_AMBULATORY_CARE_PROVIDER_SITE_OTHER): Payer: Medicare HMO

## 2014-07-09 DIAGNOSIS — E11329 Type 2 diabetes mellitus with mild nonproliferative diabetic retinopathy without macular edema: Secondary | ICD-10-CM

## 2014-07-09 DIAGNOSIS — I10 Essential (primary) hypertension: Secondary | ICD-10-CM

## 2014-07-09 DIAGNOSIS — H3532 Exudative age-related macular degeneration: Secondary | ICD-10-CM | POA: Diagnosis not present

## 2014-07-09 DIAGNOSIS — E785 Hyperlipidemia, unspecified: Secondary | ICD-10-CM | POA: Diagnosis not present

## 2014-07-09 DIAGNOSIS — H43813 Vitreous degeneration, bilateral: Secondary | ICD-10-CM | POA: Diagnosis not present

## 2014-07-09 DIAGNOSIS — E119 Type 2 diabetes mellitus without complications: Secondary | ICD-10-CM | POA: Diagnosis not present

## 2014-07-09 DIAGNOSIS — Z79899 Other long term (current) drug therapy: Secondary | ICD-10-CM

## 2014-07-09 DIAGNOSIS — C61 Malignant neoplasm of prostate: Secondary | ICD-10-CM

## 2014-07-09 DIAGNOSIS — H2511 Age-related nuclear cataract, right eye: Secondary | ICD-10-CM

## 2014-07-09 LAB — LIPID PANEL
Cholesterol: 98 mg/dL (ref 0–200)
HDL: 32.4 mg/dL — ABNORMAL LOW (ref >39.00)
LDL Cholesterol: 42 mg/dL (ref 0–99)
NonHDL: 65.6
Total CHOL/HDL Ratio: 3
Triglycerides: 116 mg/dL (ref 0.0–149.0)
VLDL: 23.2 mg/dL (ref 0.0–40.0)

## 2014-07-09 LAB — CBC WITH DIFFERENTIAL/PLATELET
Basophils Absolute: 0 10*3/uL (ref 0.0–0.1)
Basophils Relative: 0.4 % (ref 0.0–3.0)
Eosinophils Absolute: 0.2 10*3/uL (ref 0.0–0.7)
Eosinophils Relative: 3.2 % (ref 0.0–5.0)
HCT: 38.6 % — ABNORMAL LOW (ref 39.0–52.0)
Hemoglobin: 13.1 g/dL (ref 13.0–17.0)
Lymphocytes Relative: 17.2 % (ref 12.0–46.0)
Lymphs Abs: 1.2 10*3/uL (ref 0.7–4.0)
MCHC: 33.8 g/dL (ref 30.0–36.0)
MCV: 94.1 fl (ref 78.0–100.0)
Monocytes Absolute: 0.8 10*3/uL (ref 0.1–1.0)
Monocytes Relative: 10.5 % (ref 3.0–12.0)
Neutro Abs: 4.9 10*3/uL (ref 1.4–7.7)
Neutrophils Relative %: 68.7 % (ref 43.0–77.0)
Platelets: 173 10*3/uL (ref 150.0–400.0)
RBC: 4.1 Mil/uL — ABNORMAL LOW (ref 4.22–5.81)
RDW: 13.4 % (ref 11.5–15.5)
WBC: 7.2 10*3/uL (ref 4.0–10.5)

## 2014-07-09 LAB — BASIC METABOLIC PANEL
BUN: 15 mg/dL (ref 6–23)
CO2: 28 mEq/L (ref 19–32)
Calcium: 9 mg/dL (ref 8.4–10.5)
Chloride: 106 mEq/L (ref 96–112)
Creatinine, Ser: 1.08 mg/dL (ref 0.40–1.50)
GFR: 69.62 mL/min (ref >60.00)
Glucose, Bld: 179 mg/dL — ABNORMAL HIGH (ref 70–99)
Potassium: 5.4 mEq/L — ABNORMAL HIGH (ref 3.5–5.1)
Sodium: 140 mEq/L (ref 135–145)

## 2014-07-09 LAB — HEPATIC FUNCTION PANEL
ALT: 11 U/L (ref 0–53)
AST: 11 U/L (ref 0–37)
Albumin: 3.7 g/dL (ref 3.5–5.2)
Alkaline Phosphatase: 81 U/L (ref 39–117)
Bilirubin, Direct: 0.1 mg/dL (ref 0.0–0.3)
Total Bilirubin: 0.4 mg/dL (ref 0.2–1.2)
Total Protein: 6.2 g/dL (ref 6.0–8.3)

## 2014-07-09 LAB — MICROALBUMIN / CREATININE URINE RATIO
Creatinine,U: 192.5 mg/dL
Microalb Creat Ratio: 1.9 mg/g (ref 0.0–30.0)
Microalb, Ur: 3.7 mg/dL — ABNORMAL HIGH (ref 0.0–1.9)

## 2014-07-09 LAB — HEMOGLOBIN A1C: Hgb A1c MFr Bld: 7.5 % — ABNORMAL HIGH (ref 4.6–6.5)

## 2014-07-09 LAB — PSA: PSA: 0.02 ng/mL — ABNORMAL LOW (ref 0.10–4.00)

## 2014-07-16 ENCOUNTER — Encounter: Payer: Self-pay | Admitting: Family Medicine

## 2014-07-16 ENCOUNTER — Ambulatory Visit (INDEPENDENT_AMBULATORY_CARE_PROVIDER_SITE_OTHER): Payer: Medicare HMO | Admitting: Family Medicine

## 2014-07-16 VITALS — BP 120/70 | HR 79 | Temp 97.6°F | Ht 71.0 in | Wt 196.2 lb

## 2014-07-16 DIAGNOSIS — G309 Alzheimer's disease, unspecified: Secondary | ICD-10-CM | POA: Diagnosis not present

## 2014-07-16 DIAGNOSIS — C61 Malignant neoplasm of prostate: Secondary | ICD-10-CM

## 2014-07-16 DIAGNOSIS — E7800 Pure hypercholesterolemia, unspecified: Secondary | ICD-10-CM

## 2014-07-16 DIAGNOSIS — E1159 Type 2 diabetes mellitus with other circulatory complications: Secondary | ICD-10-CM

## 2014-07-16 DIAGNOSIS — E1151 Type 2 diabetes mellitus with diabetic peripheral angiopathy without gangrene: Secondary | ICD-10-CM | POA: Diagnosis not present

## 2014-07-16 DIAGNOSIS — F0280 Dementia in other diseases classified elsewhere without behavioral disturbance: Secondary | ICD-10-CM

## 2014-07-16 DIAGNOSIS — H3530 Unspecified macular degeneration: Secondary | ICD-10-CM

## 2014-07-16 DIAGNOSIS — C3492 Malignant neoplasm of unspecified part of left bronchus or lung: Secondary | ICD-10-CM

## 2014-07-16 NOTE — Progress Notes (Signed)
Pre visit review using our clinic review tool, if applicable. No additional management support is needed unless otherwise documented below in the visit note. 

## 2014-07-16 NOTE — Progress Notes (Signed)
Dr. Frederico Hamman T. Kameren Pargas, MD, [facility] [place], [address] Phone: [phone] Fax: [phone]  07/16/2014  Patient: [name], MRN: [id number], DOB: April 26, 1930, 81 y.o.  Primary Physician:  [name], MD  Chief Complaint: Follow-up  Subjective:   [name] is a 81 y.o. very pleasant male patient who presents with the following:  Diabetes Mellitus: Tolerating Medications: yes Compliance with diet: fair Exercise: minimal / intermittent Avg blood sugars at home: not checking Foot problems: none Hypoglycemia: none No nausea, vomitting, blurred vision, polyuria.  Lab Results  Component Value Date   HGBA1C 7.5* 07/09/2014   HGBA1C 7.5* 01/20/2014   HGBA1C 7.8* 02/06/2013   Lab Results  Component Value Date   MICROALBUR 3.7* 07/09/2014   LDLCALC 42 07/09/2014   CREATININE 1.08 07/09/2014    Wt Readings from Last 3 Encounters:  07/16/14 196 lb 4 oz (89.018 kg)  02/11/14 195 lb (88.451 kg)  01/20/14 195 lb 12 oz (88.792 kg)    Body mass index is 27.38 kg/(m^2).   Lipids: Doing well, stable. Tolerating meds fine with no SE. Panel reviewed with patient.  Lipids:    Component Value Date/Time   CHOL 98 07/09/2014 1039   TRIG 116.0 07/09/2014 1039   HDL 32.40* 07/09/2014 1039   LDLDIRECT 130.0 08/06/2010 0856   VLDL 23.2 07/09/2014 1039   CHOLHDL 3 07/09/2014 1039    Lab Results  Component Value Date   ALT 11 07/09/2014   AST 11 07/09/2014   ALKPHOS 81 07/09/2014   BILITOT 0.4 07/09/2014    Alz is stable, wife will not let him drive alone now. Some confusion and will get lost sometimes, but does fine with her or someone else helping with direction.  Lab Results  Component Value Date   PSA 0.02* 07/09/2014   PSA <0.01 07/16/2013   PSA 0.01 07/20/2012    Stable post treatment, 81 yo, slight increase  Eyes are worsening, frustrating  Past Medical History, Surgical History,  Social History, Family History, Problem List, Medications, and Allergies have been reviewed and updated if relevant.   GEN: No acute illnesses, no fevers, chills. GI: No n/v/d, eating normally Pulm: No SOB Interactive and getting along well at home.  Otherwise, ROS is as per the HPI.  Objective:   BP 120/70 mmHg  Pulse 79  Temp(Src) 97.6 F (36.4 C) (Oral)  Ht 5\' 11"  (1.803 m)  Wt 196 lb 4 oz (89.018 kg)  BMI 27.38 kg/m2  GEN: WDWN, NAD, Non-toxic, A & O x 3 HEENT: Atraumatic, Normocephalic. Neck supple. No masses, No LAD. Ears and Nose: No external deformity. CV: RRR, No M/G/R. No JVD. No thrill. No extra heart sounds. PULM: CTA B, no wheezes, crackles, rhonchi. No retractions. No resp. distress. No accessory muscle use. EXTR: No c/c/e NEURO Normal gait.  PSYCH: Normally interactive. Conversant. Not depressed or anxious appearing.  Calm demeanor.   Laboratory and Imaging Data: Results for orders placed or performed in visit on 07/09/14  Microalbumin / creatinine urine ratio  Result Value Ref Range   Microalb, Ur 3.7 (H) 0.0 - 1.9 mg/dL   Creatinine,U 192.5 mg/dL   Microalb Creat Ratio 1.9 0.0 - 30.0 mg/g  Lipid panel  Result Value Ref Range   Cholesterol 98 0 - 200 mg/dL   Triglycerides 116.0 0.0 - 149.0 mg/dL   HDL 32.40 (L) >39.00 mg/dL   VLDL 23.2 0.0 - 40.0 mg/dL  LDL Cholesterol 42 0 - 99 mg/dL   Total CHOL/HDL Ratio 3    NonHDL 65.60   CBC with Differential/Platelet  Result Value Ref Range   WBC 7.2 4.0 - 10.5 K/uL   RBC 4.10 (L) 4.22 - 5.81 Mil/uL   Hemoglobin 13.1 13.0 - 17.0 g/dL   HCT 38.6 (L) 39.0 - 52.0 %   MCV 94.1 78.0 - 100.0 fl   MCHC 33.8 30.0 - 36.0 g/dL   RDW 13.4 11.5 - 15.5 %   Platelets 173.0 150.0 - 400.0 K/uL   Neutrophils Relative % 68.7 43.0 - 77.0 %   Lymphocytes Relative 17.2 12.0 - 46.0 %   Monocytes Relative 10.5 3.0 - 12.0 %   Eosinophils Relative 3.2 0.0 - 5.0 %   Basophils Relative 0.4 0.0 - 3.0 %   Neutro Abs 4.9 1.4 -  7.7 K/uL   Lymphs Abs 1.2 0.7 - 4.0 K/uL   Monocytes Absolute 0.8 0.1 - 1.0 K/uL   Eosinophils Absolute 0.2 0.0 - 0.7 K/uL   Basophils Absolute 0.0 0.0 - 0.1 K/uL  Hepatic function panel  Result Value Ref Range   Total Bilirubin 0.4 0.2 - 1.2 mg/dL   Bilirubin, Direct 0.1 0.0 - 0.3 mg/dL   Alkaline Phosphatase 81 39 - 117 U/L   AST 11 0 - 37 U/L   ALT 11 0 - 53 U/L   Total Protein 6.2 6.0 - 8.3 g/dL   Albumin 3.7 3.5 - 5.2 g/dL  Basic metabolic panel  Result Value Ref Range   Sodium 140 135 - 145 mEq/L   Potassium 5.4 (H) 3.5 - 5.1 mEq/L   Chloride 106 96 - 112 mEq/L   CO2 28 19 - 32 mEq/L   Glucose, Bld 179 (H) 70 - 99 mg/dL   BUN 15 6 - 23 mg/dL   Creatinine, Ser 1.08 0.40 - 1.50 mg/dL   Calcium 9.0 8.4 - 10.5 mg/dL   GFR 69.62 >60.00 mL/min  PSA  Result Value Ref Range   PSA 0.02 (L) 0.10 - 4.00 ng/mL  Hemoglobin A1c  Result Value Ref Range   Hgb A1c MFr Bld 7.5 (H) 4.6 - 6.5 %     Assessment and Plan:   Type 2 diabetes mellitus with vascular disease  ALZHEIMER'S DISEASE, MILD  Cancer of prostate w/med recur risk (T2b-c or Gleason 7 or PSA 10-20)  Adenocarcinoma of left lung, stage 1  HYPERCHOLESTEROLEMIA  Macular degeneration of both eyes  He is doing reasonably well  Eyes are maybe most frustrating thing for him now Memory not great, but stable  No changes with a1c of 7.5  Follow-up: Return in about 6 months (around 01/15/2015) for Medicare Wellness Exam.  New Prescriptions   No medications on file   No orders of the defined types were placed in this encounter.    Signed,  [name]. Pat Elicker, MD   Patient's Medications  New Prescriptions   No medications on file  Previous Medications   ASPIRIN EC 81 MG TABLET    Take 81 mg by mouth daily.   ATORVASTATIN (LIPITOR) 40 MG TABLET    TAKE 1 TABLET BY MOUTH DAILY   DONEPEZIL (ARICEPT) 10 MG TABLET    Take 1 tablet (10 mg total) by mouth at bedtime.   GLIPIZIDE (GLUCOTROL) 5 MG TABLET    Take 1  tablet (5 mg total) by mouth daily before breakfast.   METFORMIN (GLUCOPHAGE-XR) 500 MG 24 HR TABLET    Take 1 tablet (  500 mg total) by mouth 2 (two) times daily.   MULTIPLE VITAMINS-MINERALS (ICAPS LUTEIN & ZEAXANTHIN PO)    Take 1 capsule by mouth 2 (two) times daily.    OMEGA-3 FATTY ACIDS (FISH OIL) 1000 MG CAPS    Take 1,000 mg by mouth at bedtime.   SERTRALINE (ZOLOFT) 50 MG TABLET    TAKE 1 TABLET BY MOUTH DAILY   TRIAMCINOLONE CREAM (KENALOG) 0.1 %    Apply 1 application topically 2 (two) times daily.  Modified Medications   No medications on file  Discontinued Medications   No medications on file

## 2014-07-30 ENCOUNTER — Other Ambulatory Visit: Payer: Self-pay | Admitting: Nurse Practitioner

## 2014-07-30 ENCOUNTER — Other Ambulatory Visit: Payer: Self-pay | Admitting: Family Medicine

## 2014-07-30 DIAGNOSIS — C3492 Malignant neoplasm of unspecified part of left bronchus or lung: Secondary | ICD-10-CM

## 2014-07-31 ENCOUNTER — Ambulatory Visit (HOSPITAL_BASED_OUTPATIENT_CLINIC_OR_DEPARTMENT_OTHER): Payer: Medicare HMO | Admitting: Hematology & Oncology

## 2014-07-31 ENCOUNTER — Encounter: Payer: Self-pay | Admitting: Hematology & Oncology

## 2014-07-31 ENCOUNTER — Other Ambulatory Visit (HOSPITAL_BASED_OUTPATIENT_CLINIC_OR_DEPARTMENT_OTHER): Payer: Medicare HMO

## 2014-07-31 VITALS — BP 163/80 | HR 73 | Temp 97.7°F | Resp 20 | Ht 71.0 in | Wt 194.0 lb

## 2014-07-31 DIAGNOSIS — R972 Elevated prostate specific antigen [PSA]: Secondary | ICD-10-CM

## 2014-07-31 DIAGNOSIS — Z8546 Personal history of malignant neoplasm of prostate: Secondary | ICD-10-CM | POA: Diagnosis not present

## 2014-07-31 DIAGNOSIS — C3492 Malignant neoplasm of unspecified part of left bronchus or lung: Secondary | ICD-10-CM

## 2014-07-31 DIAGNOSIS — Z85118 Personal history of other malignant neoplasm of bronchus and lung: Secondary | ICD-10-CM

## 2014-07-31 DIAGNOSIS — C61 Malignant neoplasm of prostate: Secondary | ICD-10-CM

## 2014-07-31 LAB — CBC WITH DIFFERENTIAL (CANCER CENTER ONLY)
BASO#: 0.1 10*3/uL (ref 0.0–0.2)
BASO%: 0.8 % (ref 0.0–2.0)
EOS%: 2.5 % (ref 0.0–7.0)
Eosinophils Absolute: 0.2 10*3/uL (ref 0.0–0.5)
HCT: 40.5 % (ref 38.7–49.9)
HGB: 13.4 g/dL (ref 13.0–17.1)
LYMPH#: 0.9 10*3/uL (ref 0.9–3.3)
LYMPH%: 12.6 % — ABNORMAL LOW (ref 14.0–48.0)
MCH: 32.2 pg (ref 28.0–33.4)
MCHC: 33.1 g/dL (ref 32.0–35.9)
MCV: 97 fL (ref 82–98)
MONO#: 0.7 10*3/uL (ref 0.1–0.9)
MONO%: 9.7 % (ref 0.0–13.0)
NEUT#: 5.4 10*3/uL (ref 1.5–6.5)
NEUT%: 74.4 % (ref 40.0–80.0)
Platelets: 138 10*3/uL — ABNORMAL LOW (ref 145–400)
RBC: 4.16 10*6/uL — ABNORMAL LOW (ref 4.20–5.70)
RDW: 12.5 % (ref 11.1–15.7)
WBC: 7.2 10*3/uL (ref 4.0–10.0)

## 2014-07-31 NOTE — Progress Notes (Signed)
Hematology and Oncology Follow Up Visit  [name] [id number] 11-Jun-1930 81 y.o. 07/31/2014   Principle Diagnosis:   Stage I adenocarcinoma the left lung-2001   Early stage prostate cancer-Gleason grade 7  Squamous cell carcinoma of the skin  Current Therapy:    Observation     Interim History:  [name] is in for his follow-up. He seems to be doing pretty well. Unfortunately, his wife's not doing as well.  He was white says, has an increasing PSA. They say that their family doctor is not interested in doing anythingabout this because of his age. They really want to be seen by urology. They have not seen a urologist since his prostate surgery about 14 years ago. This absolutely shocks me. As such, we will make a referral for urology.nce of recurrent disease or new disease.  Otherwise, he seems to be doing well.  Medications:  Current outpatient prescriptions:  .  aspirin EC 81 MG tablet, Take 81 mg by mouth daily., Disp: , Rfl:  .  atorvastatin (LIPITOR) 40 MG tablet, TAKE 1 TABLET BY MOUTH DAILY, Disp: 30 tablet, Rfl: 5 .  donepezil (ARICEPT) 10 MG tablet, Take 1 tablet (10 mg total) by mouth at bedtime., Disp: 90 tablet, Rfl: 3 .  glipiZIDE (GLUCOTROL) 5 MG tablet, Take 1 tablet (5 mg total) by mouth daily before breakfast., Disp: 30 tablet, Rfl: 11 .  metFORMIN (GLUCOPHAGE-XR) 500 MG 24 hr tablet, Take 1 tablet (500 mg total) by mouth 2 (two) times daily., Disp: 60 tablet, Rfl: 5 .  Multiple Vitamins-Minerals (ICAPS LUTEIN & ZEAXANTHIN PO), Take 1 capsule by mouth 2 (two) times daily. , Disp: , Rfl:  .  Omega-3 Fatty Acids (FISH OIL) 1000 MG CAPS, Take 1,000 mg by mouth at bedtime., Disp: , Rfl:  .  sertraline (ZOLOFT) 50 MG tablet, TAKE 1 TABLET BY MOUTH DAILY, Disp: 90 tablet, Rfl: 1 .  triamcinolone cream (KENALOG) 0.1 %, Apply 1 application topically 2 (two) times daily., Disp: 454 g, Rfl: 1  Allergies:  Allergies  Allergen Reactions  . Sulfa Antibiotics  Nausea Only    Past Medical History, Surgical history, Social history, and Family History were reviewed and updated.  Review of Systems: As above  Physical Exam:  height is '5\' 11"'$  (1.803 m) and weight is 194 lb (87.998 kg). His oral temperature is 97.7 F (36.5 C). His blood pressure is 163/80 and his pulse is 73. His respiration is 20.   [name]. Lungs are clear. Cardiac exam regular in and with an occasional is to be. Abdomen soft. Has good bowel sounds. There is no fluid wave. There is a palpable liver or spleen tip. Back exam no tenderness over the spine ribs or hips. His thoracotomy scar in the right lateral chest wall. Extremities shows no clubbing cyanosis or edema. Skin exam shows actinic keratosis. The review likely squamous cell or basal cell carcinomas.  Lab Results  Component Value Date   WBC 7.2 07/31/2014   HGB 13.4 07/31/2014   HCT 40.5 07/31/2014   MCV 97 07/31/2014   PLT 138* 07/31/2014     Chemistry      Component Value Date/Time   NA 139 07/31/2014 1137   NA 142 07/16/2013 1059   K 4.9 07/31/2014 1137   K 5.1 07/16/2013 1059   CL 103 07/31/2014 1137   CL 103 07/20/2012 1046   CO2 26 07/31/2014 1137   CO2 28 07/16/2013 1059   BUN 17 07/31/2014 1137  BUN 15.0 07/16/2013 1059   CREATININE 1.09 07/31/2014 1137   CREATININE 1.2 07/16/2013 1059   CREATININE 1.13 08/03/2012 1524      Component Value Date/Time   CALCIUM 8.8 07/31/2014 1137   CALCIUM 9.5 07/16/2013 1059   ALKPHOS 75 07/31/2014 1137   ALKPHOS 97 07/16/2013 1059   AST 11 07/31/2014 1137   AST 11 07/16/2013 1059   ALT 13 07/31/2014 1137   ALT 9 07/16/2013 1059   BILITOT 0.5 07/31/2014 1137   BILITOT 0.38 07/16/2013 1059         Impression and Plan: [name] is an 81 year old Castillo. He has separate malignancies.   I'm not sure what to make about the PSA. Again,  We will see if a urologist can help out.  He does not need any scans or x-rays for my point of  view.  We will just plan to see him back in one year.  Volanda Napoleon, MD 4/28/20165:35 PM

## 2014-08-01 LAB — COMPREHENSIVE METABOLIC PANEL
ALT: 13 U/L (ref 0–53)
AST: 11 U/L (ref 0–37)
Albumin: 4.1 g/dL (ref 3.5–5.2)
Alkaline Phosphatase: 75 U/L (ref 39–117)
BUN: 17 mg/dL (ref 6–23)
CO2: 26 mEq/L (ref 19–32)
Calcium: 8.8 mg/dL (ref 8.4–10.5)
Chloride: 103 mEq/L (ref 96–112)
Creatinine, Ser: 1.09 mg/dL (ref 0.50–1.35)
Glucose, Bld: 213 mg/dL — ABNORMAL HIGH (ref 70–99)
Potassium: 4.9 mEq/L (ref 3.5–5.3)
Sodium: 139 mEq/L (ref 135–145)
Total Bilirubin: 0.5 mg/dL (ref 0.2–1.2)
Total Protein: 6.2 g/dL (ref 6.0–8.3)

## 2014-08-01 LAB — PSA: PSA: <0.01 ng/mL (ref <=4.00)

## 2014-08-04 ENCOUNTER — Other Ambulatory Visit: Payer: Self-pay | Admitting: Dermatology

## 2014-08-05 ENCOUNTER — Telehealth: Payer: Self-pay | Admitting: *Deleted

## 2014-08-05 DIAGNOSIS — C61 Malignant neoplasm of prostate: Secondary | ICD-10-CM

## 2014-08-05 DIAGNOSIS — C3492 Malignant neoplasm of unspecified part of left bronchus or lung: Secondary | ICD-10-CM

## 2014-08-05 NOTE — Telephone Encounter (Addendum)
Patient's wife aware of results. Message sent to scheduler to cancel referral.   ----- Message from Volanda Napoleon, MD sent at 08/01/2014  5:54 PM EDT ----- Call and tell him that his PSA is absolutely normal. Please cancel his referral to urology. Thanks

## 2014-08-20 ENCOUNTER — Encounter (INDEPENDENT_AMBULATORY_CARE_PROVIDER_SITE_OTHER): Payer: Medicare HMO | Admitting: Ophthalmology

## 2014-08-20 DIAGNOSIS — E11319 Type 2 diabetes mellitus with unspecified diabetic retinopathy without macular edema: Secondary | ICD-10-CM | POA: Diagnosis not present

## 2014-08-20 DIAGNOSIS — H43813 Vitreous degeneration, bilateral: Secondary | ICD-10-CM | POA: Diagnosis not present

## 2014-08-20 DIAGNOSIS — H3532 Exudative age-related macular degeneration: Secondary | ICD-10-CM | POA: Diagnosis not present

## 2014-08-20 DIAGNOSIS — E11329 Type 2 diabetes mellitus with mild nonproliferative diabetic retinopathy without macular edema: Secondary | ICD-10-CM | POA: Diagnosis not present

## 2014-08-20 DIAGNOSIS — H2511 Age-related nuclear cataract, right eye: Secondary | ICD-10-CM

## 2014-08-20 LAB — HM DIABETES EYE EXAM

## 2014-09-17 ENCOUNTER — Other Ambulatory Visit: Payer: Self-pay

## 2014-09-17 MED ORDER — GLIPIZIDE 5 MG PO TABS
5.0000 mg | ORAL_TABLET | Freq: Every day | ORAL | Status: DC
Start: 2014-09-17 — End: 2015-05-22

## 2014-09-17 MED ORDER — METFORMIN HCL ER 500 MG PO TB24: 500.0000 mg | ORAL_TABLET | Freq: Two times a day (BID) | ORAL | Status: DC

## 2014-09-17 NOTE — Telephone Encounter (Signed)
[facility] request 90 day refill for glipizide and metformin; advised done per protocol and Dee at [facility] will cancel available # [phone] with refills on glipizide. Pt has appt 01/2015 for CPX.

## 2014-10-01 ENCOUNTER — Encounter (INDEPENDENT_AMBULATORY_CARE_PROVIDER_SITE_OTHER): Payer: Medicare HMO | Admitting: Ophthalmology

## 2014-10-01 DIAGNOSIS — E11329 Type 2 diabetes mellitus with mild nonproliferative diabetic retinopathy without macular edema: Secondary | ICD-10-CM

## 2014-10-01 DIAGNOSIS — H3532 Exudative age-related macular degeneration: Secondary | ICD-10-CM | POA: Diagnosis not present

## 2014-10-01 DIAGNOSIS — E11319 Type 2 diabetes mellitus with unspecified diabetic retinopathy without macular edema: Secondary | ICD-10-CM | POA: Diagnosis not present

## 2014-10-01 DIAGNOSIS — H43813 Vitreous degeneration, bilateral: Secondary | ICD-10-CM

## 2014-11-12 ENCOUNTER — Encounter (INDEPENDENT_AMBULATORY_CARE_PROVIDER_SITE_OTHER): Payer: Medicare HMO | Admitting: Ophthalmology

## 2014-11-12 DIAGNOSIS — E11329 Type 2 diabetes mellitus with mild nonproliferative diabetic retinopathy without macular edema: Secondary | ICD-10-CM

## 2014-11-12 DIAGNOSIS — H43813 Vitreous degeneration, bilateral: Secondary | ICD-10-CM

## 2014-11-12 DIAGNOSIS — E11319 Type 2 diabetes mellitus with unspecified diabetic retinopathy without macular edema: Secondary | ICD-10-CM

## 2014-11-12 DIAGNOSIS — H3532 Exudative age-related macular degeneration: Secondary | ICD-10-CM | POA: Diagnosis not present

## 2014-12-31 ENCOUNTER — Encounter (INDEPENDENT_AMBULATORY_CARE_PROVIDER_SITE_OTHER): Payer: Medicare HMO | Admitting: Ophthalmology

## 2014-12-31 DIAGNOSIS — E11319 Type 2 diabetes mellitus with unspecified diabetic retinopathy without macular edema: Secondary | ICD-10-CM | POA: Diagnosis not present

## 2014-12-31 DIAGNOSIS — H43813 Vitreous degeneration, bilateral: Secondary | ICD-10-CM

## 2014-12-31 DIAGNOSIS — E11329 Type 2 diabetes mellitus with mild nonproliferative diabetic retinopathy without macular edema: Secondary | ICD-10-CM | POA: Diagnosis not present

## 2014-12-31 DIAGNOSIS — H3532 Exudative age-related macular degeneration: Secondary | ICD-10-CM | POA: Diagnosis not present

## 2015-01-05 ENCOUNTER — Other Ambulatory Visit: Payer: Self-pay | Admitting: Family Medicine

## 2015-01-05 DIAGNOSIS — Z79899 Other long term (current) drug therapy: Secondary | ICD-10-CM

## 2015-01-05 DIAGNOSIS — E119 Type 2 diabetes mellitus without complications: Secondary | ICD-10-CM

## 2015-01-05 DIAGNOSIS — E785 Hyperlipidemia, unspecified: Secondary | ICD-10-CM

## 2015-01-08 ENCOUNTER — Other Ambulatory Visit: Payer: Medicare HMO

## 2015-01-15 ENCOUNTER — Encounter: Payer: Self-pay | Admitting: Family Medicine

## 2015-01-15 ENCOUNTER — Ambulatory Visit (INDEPENDENT_AMBULATORY_CARE_PROVIDER_SITE_OTHER): Payer: Medicare HMO | Admitting: Family Medicine

## 2015-01-15 VITALS — BP 150/86 | HR 78 | Temp 97.6°F | Ht 69.0 in | Wt 193.8 lb

## 2015-01-15 DIAGNOSIS — Z23 Encounter for immunization: Secondary | ICD-10-CM

## 2015-01-15 DIAGNOSIS — Z8546 Personal history of malignant neoplasm of prostate: Secondary | ICD-10-CM | POA: Diagnosis not present

## 2015-01-15 DIAGNOSIS — Z0000 Encounter for general adult medical examination without abnormal findings: Secondary | ICD-10-CM | POA: Diagnosis not present

## 2015-01-15 DIAGNOSIS — C61 Malignant neoplasm of prostate: Secondary | ICD-10-CM

## 2015-01-15 DIAGNOSIS — E785 Hyperlipidemia, unspecified: Secondary | ICD-10-CM

## 2015-01-15 DIAGNOSIS — E119 Type 2 diabetes mellitus without complications: Secondary | ICD-10-CM

## 2015-01-15 DIAGNOSIS — Z79899 Other long term (current) drug therapy: Secondary | ICD-10-CM | POA: Diagnosis not present

## 2015-01-15 NOTE — Progress Notes (Signed)
Dr. Frederico Hamman T. Jeff Mccallum, MD, [facility] [place], [address] Phone: [phone] Fax: [phone]  01/15/2015  Patient: [name], MRN: [id number], DOB: 1931/03/16, 81 y.o.  Primary Physician:  [name], MD   Chief Complaint  Patient presents with  . Annual Exam    Medicare Wellness   Subjective:   [name] is a 81 y.o. pleasant patient who presents for a medicare wellness examination:  Preventative Health Maintenance Visit:  Health Maintenance Summary Reviewed and updated, unless pt declines services.  Tobacco History Reviewed. Alcohol: No concerns, no excessive use Exercise Habits: Some activity, rec at least 30 mins 5 times a week STD concerns: no risk or activity to increase risk Drug Use: None Encouraged self-testicular check  Diabetes Mellitus: Tolerating Medications: yes Compliance with diet: fair Exercise: minimal / intermittent Avg blood sugars at home: not checking Foot problems: none Hypoglycemia: none No nausea, vomitting, blurred vision, polyuria. Doing better with lower Metfomin dosing  Lab Results  Component Value Date   HGBA1C 7.7* 01/15/2015   HGBA1C 7.5* 07/09/2014   HGBA1C 7.5* 01/20/2014   Lab Results  Component Value Date   MICROALBUR 3.7* 07/09/2014   LDLCALC 42 07/09/2014   CREATININE 1.29 01/15/2015    Wt Readings from Last 3 Encounters:  01/15/15 193 lb 12 oz (87.884 kg)  07/31/14 194 lb (87.998 kg)  07/16/14 196 lb 4 oz (89.018 kg)    Body mass index is 28.6 kg/(m^2).   Health Maintenance  Topic Date Due  . ZOSTAVAX  10/15/1992  . URINE MICROALBUMIN  07/09/2015  . HEMOGLOBIN A1C  07/16/2015  . OPHTHALMOLOGY EXAM  08/20/2015  . INFLUENZA VACCINE  11/03/2015  . FOOT EXAM  01/18/2016  . TETANUS/TDAP  01/26/2023  . PNA vac Low Risk Adult  Completed    Immunization History  Administered Date(s) Administered  . Influenza Whole 01/03/2007    . Influenza,inj,Quad PF,36+ Mos 02/06/2013, 01/20/2014, 01/15/2015  . Pneumococcal Conjugate-13 01/20/2014  . Pneumococcal Polysaccharide-23 01/02/2001  . Td 04/04/1994  . Tdap 01/25/2013    Patient Active Problem List   Diagnosis Date Noted  . CAD (coronary artery disease)     Priority: High  . Adenocarcinoma of left lung, stage 1 ([place]) 07/25/2011    Priority: High  . Cancer of prostate w/med recur risk (T2b-c or Gleason 7 or PSA 10-20) ([place]) 07/25/2011    Priority: High  . Type 2 diabetes mellitus with vascular disease ([place]) 05/21/2007    Priority: High  . Pericardial tamponade 09/17/2012    Priority: Medium  . Acute pericarditis, unspecified 09/13/2012    Priority: Medium  . HYPERCHOLESTEROLEMIA 05/21/2007    Priority: Medium  . Essential hypertension 01/08/2007    Priority: Medium  . Macular degeneration of both eyes 01/21/2014  . 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 Nonmelanoma skin cancer 07/25/2011  . History of colonic polyps 07/25/2011  . VERTIGO 07/22/2008  . CARCINOMA, SKIN, SQUAMOUS CELL 01/08/2007  . Generalized anxiety disorder 01/08/2007  . ALZHEIMER'S DISEASE, MILD 01/08/2007   Past Medical History  Diagnosis Date  . Unspecified essential hypertension   . Alzheimer's disease   . Squamous cell carcinoma of skin   . Prostate CA ([place]) 04/2001    Gleason 7  S/P prostatectomy 04/11/01  . Nonmelanoma skin cancer 07/25/2011    Multiple lesions excised face/nose  . History of colonic polyps 07/25/2011  . CAD (coronary artery disease) 2014  a. 09/2012 Cath: LM nl, LAD 50-60p, D1 60-70 m, D1 50ost, LCX nl, RCA min irregs, EF 55-65%.  . Pericarditis     a. 09/2012 with effusion and tamponade, s/p window.  b.  F/u Echo 09/19/12: mod LVH, EF 55%, Gr 1 DD, Tr MR, mild RVE, no residual effusion  . Type 2 diabetes mellitus with vascular disease ([place]) 05/21/2007    Qualifier: Diagnosis of  By: Diona Browner MD, [name]    . Generalized anxiety disorder 01/08/2007    Qualifier:  Diagnosis of  By: Diona Browner MD, [name]    . Adenocarcinoma of left lung, stage 1 ([place]) 2001    T1N0 stage I  adenoca left lung resected 01/03/00   . 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 Macular degeneration of both eyes 01/21/2014   Past Surgical History  Procedure Laterality Date  . Lobectomy  2001    upper left  . Prostatectomy  2003  . Hernia repair    . Tonsillectomy    . Subxyphoid pericardial window N/A 09/16/2012    Procedure: SUBXYPHOID PERICARDIAL WINDOW;  Surgeon: Rexene Alberts, MD;  Location: [place];  Service: Thoracic;  Laterality: N/A;  . Left heart catheterization with coronary angiogram N/A 09/13/2012    Procedure: LEFT HEART CATHETERIZATION WITH CORONARY ANGIOGRAM;  Surgeon: Sherren Mocha, MD;  Location: [facility] CATH LAB;  Service: Cardiovascular;  Laterality: N/A;  . Pericardial tap N/A 09/16/2012    Procedure: PERICARDIAL TAP;  Surgeon: Sinclair Grooms, MD;  Location: [facility] CATH LAB;  Service: Cardiovascular;  Laterality: N/A;   Social History   Social History  . Marital Status: Married    Spouse Name: N/A  . Number of Children: N/A  . Years of Education: N/A   Occupational History  . retired    Social History Main Topics  . Smoking status: Former Smoker -- 2.00 packs/day for 42 years    Types: Cigarettes    Start date: 02/25/1953    Quit date: 04/04/1984  . Smokeless tobacco: Former Systems developer    Types: [place] date: 07/27/1986     Comment: quit tobacco 26 years ago  . Alcohol Use: No  . Drug Use: No  . Sexual Activity: Not on file   Other Topics Concern  . Not on file   Social History Narrative   Lives in [place], [place]   Regular exercise--no, mowing grass   Diet: fruit and veggies   Family History  Problem Relation Age of Onset  . Dementia Mother   . Diabetes Mother   . Cancer Father     colon  . Cancer Brother     lung   Allergies  Allergen Reactions  . Sulfa Antibiotics Nausea Only    Medication list has been reviewed and  updated.   General: Denies fever, chills, sweats. No significant weight loss. Eyes: Denies blurring,significant itching ENT: Denies earache, sore throat, and hoarseness. Cardiovascular: Denies chest pains, palpitations, dyspnea on exertion Respiratory: Denies cough, dyspnea at rest,wheeezing Breast: no concerns about lumps GI: Denies nausea, vomiting, diarrhea, constipation, change in bowel habits, abdominal pain, melena, hematochezia GU: Denies penile discharge, ED, urinary flow / outflow problems. No STD concerns. Musculoskeletal: Denies back pain, joint pain Derm: Denies rash, itching Neuro: Denies  paresthesias, frequent falls, frequent headaches Psych: Denies depression, anxiety Endocrine: Denies cold intolerance, heat intolerance, polydipsia Heme: Denies enlarged lymph nodes Allergy: No hayfever  Objective:   BP 150/86 mmHg  Pulse 78  Temp(Src) 97.6 F (36.4 C) (  Oral)  Ht _0  (1.753 m)  Wt 193 lb 12 oz (87.884 kg)  BMI 28.60 kg/m2  The patient completed a fall screen and PHQ-2 and PHQ-9 if necessary, which is documented in the EHR. The CMA/LPN/RN who assisted the patient verbally completed with them and documented results in Page.  Hearing Screening Comments: Wears Bilateral Hearing Aides Vision Screening Comments: Wears Glasses-Eye Exam with Dr. Herbert Deaner 12/11/14  GEN: well developed, well nourished, no acute distress Eyes: conjunctiva and lids normal, PERRLA, EOMI ENT: TM clear, nares clear, oral exam WNL Neck: supple, no lymphadenopathy, no thyromegaly, no JVD Pulm: clear to auscultation and percussion, respiratory effort normal CV: regular rate and rhythm, S1-S2, no murmur, rub or gallop, no bruits, peripheral pulses normal and symmetric, no cyanosis, clubbing, edema or varicosities GI: soft, non-tender; no hepatosplenomegaly, masses; active bowel sounds all quadrants GU: no hernia, testicular mass, penile discharge Lymph: no cervical, axillary or  inguinal adenopathy MSK: gait normal, muscle tone and strength WNL, no joint swelling, effusions, discoloration, crepitus  SKIN: clear, good turgor, color WNL, no rashes, lesions, or ulcerations Neuro: normal mental status, normal strength, sensation, and motion Psych: alert; oriented to person, place and time, normally interactive and not anxious or depressed in appearance.  All labs reviewed with patient.  Lipids:    Component Value Date/Time   CHOL 165 01/15/2015 1547   TRIG 269.0* 01/15/2015 1547   HDL 33.50* 01/15/2015 1547   LDLDIRECT 103.0 01/15/2015 1547   VLDL 53.8* 01/15/2015 1547   CHOLHDL 5 01/15/2015 1547   CBC: CBC Latest Ref Rng 01/15/2015 07/31/2014 07/09/2014  WBC 4.0 - 10.5 K/uL 8.9 7.2 7.2  Hemoglobin 13.0 - 17.0 g/dL 14.1 13.4 13.1  Hematocrit 39.0 - 52.0 % 42.0 40.5 38.6(L)  Platelets 150.0 - 400.0 K/uL 172.0 138(L) 222.9    Basic Metabolic Panel:    Component Value Date/Time   NA 136 01/15/2015 1547   NA 142 07/16/2013 1059   K 5.3* 01/15/2015 1547   K 5.1 07/16/2013 1059   CL 100 01/15/2015 1547   CL 103 07/20/2012 1046   CO2 29 01/15/2015 1547   CO2 28 07/16/2013 1059   BUN 19 01/15/2015 1547   BUN 15.0 07/16/2013 1059   CREATININE 1.29 01/15/2015 1547   CREATININE 1.2 07/16/2013 1059   CREATININE 1.13 08/03/2012 1524   GLUCOSE 175* 01/15/2015 1547   GLUCOSE 199* 07/16/2013 1059   GLUCOSE 215* 07/20/2012 1046   CALCIUM 9.4 01/15/2015 1547   CALCIUM 9.5 07/16/2013 1059   Hepatic Function Latest Ref Rng 01/15/2015 07/31/2014 07/09/2014  Total Protein 6.0 - 8.3 g/dL 6.9 6.2 6.2  Albumin 3.5 - 5.2 g/dL 4.1 4.1 3.7  AST 0 - 37 U/L _1 ALT 0 - 53 U/L _2 Alk Phosphatase 39 - 117 U/L 83 75 81  Total Bilirubin 0.2 - 1.2 mg/dL 0.4 0.5 0.4  Bilirubin, Direct 0.0 - 0.3 mg/dL 0.1 - 0.1    Lab Results  Component Value Date   TSH 1.110 04/19/2011   Lab Results  Component Value Date   PSA <0.01 07/31/2014   PSA 0.02* 07/09/2014   PSA  <0.01 07/16/2013    Assessment and Plan:   Healthcare maintenance  Need for prophylactic vaccination and inoculation against influenza - Plan: Flu Vaccine QUAD 36+ mos IM  Hyperlipemia - Plan: Lipid panel  Diabetes mellitus without complication ([place]) - Plan: Hemoglobin A1c  Encounter for long-term (current) use of medications -  Plan: Hepatic function panel, Basic metabolic panel, CBC with Differential/Platelet  Health Maintenance Exam: The patient's preventative maintenance and recommended screening tests for an annual wellness exam were reviewed in full today. Brought up to date unless services declined.  Counselled on the importance of diet, exercise, and its role in overall health and mortality. The patient's FH and SH was reviewed, including their home life, tobacco status, and drug and alcohol status.  I have personally reviewed the Medicare Annual Wellness questionnaire and have noted 1. The patient's medical and social history 2. Their use of alcohol, tobacco or illicit drugs 3. Their current medications and supplements 4. The patient's functional ability including ADL's, fall risks, home safety risks and hearing or visual             impairment. 5. Diet and physical activities 6. Evidence for depression or mood disorders 7. Reviewed Updated provider list, see scanned forms and [facility] Snapshot.   The patients weight, height, BMI and visual acuity have been recorded in the chart I have made referrals, counseling and provided education to the patient based review of the above and I have provided the pt with a written personalized care plan for preventive services.  I have provided the patient with a copy of your personalized plan for preventive services. Instructed to take the time to review along with their updated medication list.  Overall, doing well given prior problems  Follow-up: Return in about 6 months (around 07/16/2015). Or follow-up in 1 year for complete physical  examination  New Prescriptions   No medications on file   Modified Medications   No medications on file   Orders Placed This Encounter  Procedures  . Flu Vaccine QUAD 36+ mos IM  . LDL cholesterol, direct    Signed,  Suzannah Bettes T. Jennika Ringgold, MD   Patient's Medications  New Prescriptions   No medications on file  Previous Medications   ASPIRIN EC 81 MG TABLET    Take 81 mg by mouth daily.   ATORVASTATIN (LIPITOR) 40 MG TABLET    TAKE 1 TABLET BY MOUTH DAILY   DONEPEZIL (ARICEPT) 10 MG TABLET    Take 1 tablet (10 mg total) by mouth at bedtime.   GLIPIZIDE (GLUCOTROL) 5 MG TABLET    Take 1 tablet (5 mg total) by mouth daily before breakfast.   METFORMIN (GLUCOPHAGE-XR) 500 MG 24 HR TABLET    Take 1 tablet (500 mg total) by mouth 2 (two) times daily.   MULTIPLE VITAMINS-MINERALS (ICAPS LUTEIN & ZEAXANTHIN PO)    Take 1 capsule by mouth 2 (two) times daily.    OMEGA-3 FATTY ACIDS (FISH OIL) 1000 MG CAPS    Take 1,000 mg by mouth at bedtime.   SERTRALINE (ZOLOFT) 50 MG TABLET    TAKE 1 TABLET BY MOUTH DAILY   TRIAMCINOLONE CREAM (KENALOG) 0.1 %    Apply 1 application topically 2 (two) times daily.  Modified Medications   No medications on file  Discontinued Medications   No medications on file

## 2015-01-15 NOTE — Progress Notes (Signed)
Pre visit review using our clinic review tool, if applicable. No additional management support is needed unless otherwise documented below in the visit note. 

## 2015-01-16 LAB — HEMOGLOBIN A1C: Hgb A1c MFr Bld: 7.7 % — ABNORMAL HIGH (ref 4.6–6.5)

## 2015-01-16 LAB — HEPATIC FUNCTION PANEL
ALT: 10 U/L (ref 0–53)
AST: 11 U/L (ref 0–37)
Albumin: 4.1 g/dL (ref 3.5–5.2)
Alkaline Phosphatase: 83 U/L (ref 39–117)
Bilirubin, Direct: 0.1 mg/dL (ref 0.0–0.3)
Total Bilirubin: 0.4 mg/dL (ref 0.2–1.2)
Total Protein: 6.9 g/dL (ref 6.0–8.3)

## 2015-01-16 LAB — CBC WITH DIFFERENTIAL/PLATELET
Basophils Absolute: 0 10*3/uL (ref 0.0–0.1)
Basophils Relative: 0.5 % (ref 0.0–3.0)
Eosinophils Absolute: 0.1 10*3/uL (ref 0.0–0.7)
Eosinophils Relative: 0.9 % (ref 0.0–5.0)
HCT: 42 % (ref 39.0–52.0)
Hemoglobin: 14.1 g/dL (ref 13.0–17.0)
Lymphocytes Relative: 12.9 % (ref 12.0–46.0)
Lymphs Abs: 1.2 10*3/uL (ref 0.7–4.0)
MCHC: 33.5 g/dL (ref 30.0–36.0)
MCV: 95.7 fl (ref 78.0–100.0)
Monocytes Absolute: 0.9 10*3/uL (ref 0.1–1.0)
Monocytes Relative: 9.8 % (ref 3.0–12.0)
Neutro Abs: 6.8 10*3/uL (ref 1.4–7.7)
Neutrophils Relative %: 75.9 % (ref 43.0–77.0)
Platelets: 172 10*3/uL (ref 150.0–400.0)
RBC: 4.39 Mil/uL (ref 4.22–5.81)
RDW: 13.3 % (ref 11.5–15.5)
WBC: 8.9 10*3/uL (ref 4.0–10.5)

## 2015-01-16 LAB — LIPID PANEL
Cholesterol: 165 mg/dL (ref 0–200)
HDL: 33.5 mg/dL — ABNORMAL LOW (ref >39.00)
NonHDL: 131.6
Total CHOL/HDL Ratio: 5
Triglycerides: 269 mg/dL — ABNORMAL HIGH (ref 0.0–149.0)
VLDL: 53.8 mg/dL — ABNORMAL HIGH (ref 0.0–40.0)

## 2015-01-16 LAB — LDL CHOLESTEROL, DIRECT: Direct LDL: 103 mg/dL

## 2015-01-16 LAB — BASIC METABOLIC PANEL
BUN: 19 mg/dL (ref 6–23)
CO2: 29 mEq/L (ref 19–32)
Calcium: 9.4 mg/dL (ref 8.4–10.5)
Chloride: 100 mEq/L (ref 96–112)
Creatinine, Ser: 1.29 mg/dL (ref 0.40–1.50)
GFR: 56.64 mL/min — ABNORMAL LOW (ref >60.00)
Glucose, Bld: 175 mg/dL — ABNORMAL HIGH (ref 70–99)
Potassium: 5.3 mEq/L — ABNORMAL HIGH (ref 3.5–5.1)
Sodium: 136 mEq/L (ref 135–145)

## 2015-02-02 ENCOUNTER — Other Ambulatory Visit: Payer: Self-pay | Admitting: Family Medicine

## 2015-02-23 ENCOUNTER — Encounter (INDEPENDENT_AMBULATORY_CARE_PROVIDER_SITE_OTHER): Payer: Medicare HMO | Admitting: Ophthalmology

## 2015-02-23 DIAGNOSIS — H353231 Exudative age-related macular degeneration, bilateral, with active choroidal neovascularization: Secondary | ICD-10-CM | POA: Diagnosis not present

## 2015-02-23 DIAGNOSIS — E113213 Type 2 diabetes mellitus with mild nonproliferative diabetic retinopathy with macular edema, bilateral: Secondary | ICD-10-CM | POA: Diagnosis not present

## 2015-02-23 DIAGNOSIS — E11311 Type 2 diabetes mellitus with unspecified diabetic retinopathy with macular edema: Secondary | ICD-10-CM | POA: Diagnosis not present

## 2015-02-23 DIAGNOSIS — H43813 Vitreous degeneration, bilateral: Secondary | ICD-10-CM | POA: Diagnosis not present

## 2015-04-27 ENCOUNTER — Encounter (INDEPENDENT_AMBULATORY_CARE_PROVIDER_SITE_OTHER): Payer: Medicare HMO | Admitting: Ophthalmology

## 2015-04-27 DIAGNOSIS — E113213 Type 2 diabetes mellitus with mild nonproliferative diabetic retinopathy with macular edema, bilateral: Secondary | ICD-10-CM

## 2015-04-27 DIAGNOSIS — H43813 Vitreous degeneration, bilateral: Secondary | ICD-10-CM | POA: Diagnosis not present

## 2015-04-27 DIAGNOSIS — E11311 Type 2 diabetes mellitus with unspecified diabetic retinopathy with macular edema: Secondary | ICD-10-CM | POA: Diagnosis not present

## 2015-04-27 DIAGNOSIS — H353231 Exudative age-related macular degeneration, bilateral, with active choroidal neovascularization: Secondary | ICD-10-CM | POA: Diagnosis not present

## 2015-05-01 ENCOUNTER — Telehealth: Payer: Self-pay

## 2015-05-01 NOTE — Telephone Encounter (Signed)
Rod Holler nurse with [employer] ins left v/m;Ruth noticed gap in getting atorvastatin refills per ins claims; pt refilled atorvastatin for 3 months in 05/22/2014 and then no ins claim to refill until 04/01/15. Rod Holler wanted Dr Lorelei Pont to be aware. Rod Holler does not require cb. Pt seen 01/15/15 for annual exam.

## 2015-05-01 NOTE — Telephone Encounter (Signed)
i believe that i reminded the patient to take his medication at his wellness exam. Noted.

## 2015-05-22 ENCOUNTER — Other Ambulatory Visit: Payer: Self-pay | Admitting: Family Medicine

## 2015-06-04 ENCOUNTER — Ambulatory Visit (INDEPENDENT_AMBULATORY_CARE_PROVIDER_SITE_OTHER): Payer: Medicare HMO | Admitting: Primary Care

## 2015-06-04 ENCOUNTER — Encounter: Payer: Self-pay | Admitting: Primary Care

## 2015-06-04 VITALS — BP 122/70 | HR 80 | Temp 97.7°F | Ht 71.0 in | Wt 192.8 lb

## 2015-06-04 DIAGNOSIS — R062 Wheezing: Secondary | ICD-10-CM | POA: Diagnosis not present

## 2015-06-04 DIAGNOSIS — R05 Cough: Secondary | ICD-10-CM | POA: Diagnosis not present

## 2015-06-04 DIAGNOSIS — R059 Cough, unspecified: Secondary | ICD-10-CM

## 2015-06-04 MED ORDER — AZITHROMYCIN 250 MG PO TABS: ORAL_TABLET | ORAL | Status: DC

## 2015-06-04 MED ORDER — ALBUTEROL SULFATE HFA 108 (90 BASE) MCG/ACT IN AERS: 2.0000 | INHALATION_SPRAY | Freq: Four times a day (QID) | RESPIRATORY_TRACT | Status: DC | PRN

## 2015-06-04 NOTE — Patient Instructions (Signed)
Start Azithromycin antibiotics. Take 2 tablets by mouth today, then 1 tablet daily for 4 additional days.  You may use the albuterol inhaler as needed for wheezing and shortness of breath. Inhale 2 puffs into the lungs every 6 hours as needed for wheezing.  Cough/Congestion: Try taking Mucinex DM. This will help loosen up the mucous in your chest. Ensure you take this medication with a full glass of water.  Ensure you are staying hydrated with water.  Please call me if no improvement in symptoms in 3-4 days or if symptoms become worse.  It was a pleasure meeting you!

## 2015-06-04 NOTE — Progress Notes (Signed)
Subjective:    Patient ID: [name], male    DOB: 07/29/1930, 82 y.o.   MRN: [id number]  HPI  Mr. Foti is an 82 year old male who presents today with a chief complaint of cough. He also reports nasal congestion, sore throat, mild shortness of breath, wheezing, chest congestion. His symptoms have been present for the past 3-4 days. His cough is dry, denies fevers. He was around his grandchildren several days before they were diagnosed with the flu. He's taken Nyquil and benadryl with temporary improvement.   Review of Systems  Constitutional: Positive for chills and fatigue. Negative for fever.  HENT: Positive for congestion and sore throat. Negative for ear pain.   Respiratory: Positive for cough, shortness of breath and wheezing.   Cardiovascular: Negative for chest pain.  Musculoskeletal: Positive for myalgias.       Past Medical History  Diagnosis Date  . Unspecified essential hypertension   . Alzheimer's disease   . Squamous cell carcinoma of skin   . Prostate CA ([place]) 04/2001    Gleason 7  S/P prostatectomy 04/11/01  . Nonmelanoma skin cancer 07/25/2011    Multiple lesions excised face/nose  . History of colonic polyps 07/25/2011  . CAD (coronary artery disease) 2014    a. 09/2012 Cath: LM nl, LAD 50-60p, D1 60-70 m, D1 50ost, LCX nl, RCA min irregs, EF 55-65%.  . Pericarditis     a. 09/2012 with effusion and tamponade, s/p window.  b.  F/u Echo 09/19/12: mod LVH, EF 55%, Gr 1 DD, Tr MR, mild RVE, no residual effusion  . Type 2 diabetes mellitus with vascular disease ([place]) 05/21/2007    Qualifier: Diagnosis of  By: Diona Browner MD, [name]    . Generalized anxiety disorder 01/08/2007    Qualifier: Diagnosis of  By: Diona Browner MD, [name]    . Adenocarcinoma of left lung, stage 1 ([place]) 2001    T1N0 stage I  adenoca left lung resected 01/03/00   . 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 Macular degeneration of both eyes 01/21/2014    Social History   Social History  . Marital  Status: Married    Spouse Name: N/A  . Number of Children: N/A  . Years of Education: N/A   Occupational History  . retired    Social History Main Topics  . Smoking status: Former Smoker -- 2.00 packs/day for 42 years    Types: Cigarettes    Start date: 02/25/1953    Quit date: 04/04/1984  . Smokeless tobacco: Former Systems developer    Types: [place] date: 07/27/1986     Comment: quit tobacco 26 years ago  . Alcohol Use: No  . Drug Use: No  . Sexual Activity: Not on file   Other Topics Concern  . Not on file   Social History Narrative   Lives in [place], [place]   Regular exercise--no, mowing grass   Diet: fruit and veggies    Past Surgical History  Procedure Laterality Date  . Lobectomy  2001    upper left  . Prostatectomy  2003  . Hernia repair    . Tonsillectomy    . Subxyphoid pericardial window N/A 09/16/2012    Procedure: SUBXYPHOID PERICARDIAL WINDOW;  Surgeon: Rexene Alberts, MD;  Location: [place];  Service: Thoracic;  Laterality: N/A;  . Left heart catheterization with coronary angiogram N/A 09/13/2012    Procedure: LEFT HEART CATHETERIZATION WITH CORONARY ANGIOGRAM;  Surgeon: Sherren Mocha, MD;  Location: [facility] CATH LAB;  Service: Cardiovascular;  Laterality: N/A;  . Pericardial tap N/A 09/16/2012    Procedure: PERICARDIAL TAP;  Surgeon: Sinclair Grooms, MD;  Location[facility] CATH LAB;  Service: Cardiovascular;  Laterality: N/A;    Family History  Problem Relation Age of Onset  . Dementia Mother   . Diabetes Mother   . Cancer Father     colon  . Cancer Brother     lung    Allergies  Allergen Reactions  . Sulfa Antibiotics Nausea Only    Current Outpatient Prescriptions on File Prior to Visit  Medication Sig Dispense Refill  . aspirin EC 81 MG tablet Take 81 mg by mouth daily.    Marland Kitchen atorvastatin (LIPITOR) 40 MG tablet TAKE 1 TABLET BY MOUTH DAILY 30 tablet 5  . donepezil (ARICEPT) 10 MG tablet Take 1 tablet (10 mg total) by mouth at bedtime. 90 tablet 3  .  glipiZIDE (GLUCOTROL) 5 MG tablet TAKE 1 TABLET BY MOUTH DAILY BEFORE BREAKFAST 90 tablet 1  . metFORMIN (GLUCOPHAGE-XR) 500 MG 24 hr tablet Take 1 tablet (500 mg total) by mouth 2 (two) times daily. 180 tablet 1  . Multiple Vitamins-Minerals (ICAPS LUTEIN & ZEAXANTHIN PO) Take 1 capsule by mouth 2 (two) times daily.     . Omega-3 Fatty Acids (FISH OIL) 1000 MG CAPS Take 1,000 mg by mouth at bedtime.    . sertraline (ZOLOFT) 50 MG tablet TAKE 1 TABLET BY MOUTH DAILY 90 tablet 1  . triamcinolone cream (KENALOG) 0.1 % Apply 1 application topically 2 (two) times daily. 454 g 1   No current facility-administered medications on file prior to visit.    BP 122/70 mmHg  Pulse 80  Temp(Src) 97.7 F (36.5 C) (Oral)  Ht '5\' 11"'$  (1.803 m)  Wt 192 lb 12.8 oz (87.454 kg)  BMI 26.90 kg/m2  SpO2 98%    Objective:   Physical Exam  Constitutional: He appears well-nourished. He appears ill.  HENT:  Right Ear: Tympanic membrane and ear canal normal.  Left Ear: Tympanic membrane and ear canal normal.  Nose: Right sinus exhibits no maxillary sinus tenderness and no frontal sinus tenderness. Left sinus exhibits no maxillary sinus tenderness and no frontal sinus tenderness.  Mouth/Throat: Oropharynx is clear and moist.  Eyes: Conjunctivae are normal.  Neck: Neck supple.  Cardiovascular: Normal rate and regular rhythm.   Pulmonary/Chest: Effort normal. He has no decreased breath sounds. He has wheezes in the right upper field and the left upper field. He has rhonchi in the right upper field, the right lower field, the left upper field and the left lower field.  Skin: Skin is warm and dry.          Assessment & Plan:  URI:  Cough, congestion, fatigue, SOB, wheezing x 4 days. No improvement with OTC treatment.  Exam with rhonchi throughout, wheezing to upper fields, appears ill. Given presentation and examination will treat empirically for likely bacterial involvement. RX for Zpak provided. Start  Mucinex DM, flonase, fluids, rest. Strict return precautions provided.

## 2015-06-04 NOTE — Progress Notes (Signed)
Pre visit review using our clinic review tool, if applicable. No additional management support is needed unless otherwise documented below in the visit note. 

## 2015-06-08 ENCOUNTER — Ambulatory Visit (INDEPENDENT_AMBULATORY_CARE_PROVIDER_SITE_OTHER): Payer: Medicare HMO | Admitting: Family Medicine

## 2015-06-08 ENCOUNTER — Ambulatory Visit (INDEPENDENT_AMBULATORY_CARE_PROVIDER_SITE_OTHER)
Admission: RE | Admit: 2015-06-08 | Discharge: 2015-06-08 | Disposition: A | Discharge status: Home / Self Care | Payer: Medicare HMO | Source: Ambulatory Visit | Attending: Family Medicine | Admitting: Family Medicine

## 2015-06-08 ENCOUNTER — Encounter: Payer: Self-pay | Admitting: Family Medicine

## 2015-06-08 VITALS — BP 128/72 | HR 85 | Temp 97.5°F | Wt 189.2 lb

## 2015-06-08 DIAGNOSIS — R05 Cough: Secondary | ICD-10-CM | POA: Diagnosis not present

## 2015-06-08 DIAGNOSIS — R059 Cough, unspecified: Secondary | ICD-10-CM | POA: Insufficient documentation

## 2015-06-08 DIAGNOSIS — D125 Benign neoplasm of sigmoid colon: Secondary | ICD-10-CM | POA: Diagnosis not present

## 2015-06-08 DIAGNOSIS — K621 Rectal polyp: Secondary | ICD-10-CM | POA: Diagnosis not present

## 2015-06-08 MED ORDER — PREDNISONE 20 MG PO TABS: ORAL_TABLET | ORAL | Status: DC

## 2015-06-08 NOTE — Progress Notes (Signed)
Pre visit review using our clinic review tool, if applicable. No additional management support is needed unless otherwise documented below in the visit note.  Sick contacts prev noted- GI illness and patient didn't have that.  He has been sick for about 1 week now.  Recently seen and rx'd zmax, last dose today.   rx'd SABA at last OV last week.  Prev didn't have to use inhaler.  Former smoker.  Cough, chest is sore from coughing- that is some better.  Still with cough.  Taking robitussin DM for the cough.  Still wheezing.  Not much help with SABA.  Some scant sputum, clear.  No fevers known, but had some chills prev- not today.   H/o lobectomy.  H/o DM2.  Last A1c <8 but usually has AM sugars ~200, that is his baseline.   PMH and SH reviewed  ROS: See HPI, otherwise noncontributory.  Meds, vitals, and allergies reviewed.   GEN: nad, alert and speaking in complete sentences.  HEENT: mucous membranes moist, OP wnl NECK: supple w/o LA CV: rrr.  PULM: B diffuse wheeze that are not severe, no inc wob, no focal dec in BS ABD: soft, +bs EXT: no edema

## 2015-06-08 NOTE — Assessment & Plan Note (Addendum)
Worse/not better.  Needs w/u.  Done with zmax as of today.  D/w pt and wife.  Last OV reviewed with patient, re: onset and prev sx, tx.  Check cxr given the cough and his PSHx.  Add on pred with sugar/steroid cautions.  Continue SABA prn in meantime, with the prednisone.  Still okay for outpatient f/u.  He and wife agree with plan.

## 2015-06-08 NOTE — Patient Instructions (Signed)
Go to the lab on the way out.  We'll contact you with your xray report. Add on prednisone in the meantime.  Take it with food.  2 a day for 3 days, then 1 a day for 3 days.  Use the albuterol every 6 (six) hours as needed for wheezing.  Take care.  Glad to see you.  Update [place] as needed.

## 2015-06-16 ENCOUNTER — Ambulatory Visit (INDEPENDENT_AMBULATORY_CARE_PROVIDER_SITE_OTHER): Payer: Medicare HMO | Admitting: Family Medicine

## 2015-06-16 ENCOUNTER — Encounter: Payer: Self-pay | Admitting: Family Medicine

## 2015-06-16 VITALS — BP 124/70 | HR 94 | Temp 98.4°F | Wt 187.5 lb

## 2015-06-16 DIAGNOSIS — R062 Wheezing: Secondary | ICD-10-CM

## 2015-06-16 DIAGNOSIS — R0989 Other specified symptoms and signs involving the circulatory and respiratory systems: Secondary | ICD-10-CM

## 2015-06-16 DIAGNOSIS — R059 Cough, unspecified: Secondary | ICD-10-CM

## 2015-06-16 DIAGNOSIS — R05 Cough: Secondary | ICD-10-CM | POA: Diagnosis not present

## 2015-06-16 MED ORDER — ALBUTEROL SULFATE HFA 108 (90 BASE) MCG/ACT IN AERS: 2.0000 | INHALATION_SPRAY | Freq: Four times a day (QID) | RESPIRATORY_TRACT | Status: DC | PRN

## 2015-06-16 MED ORDER — ALBUTEROL SULFATE (2.5 MG/3ML) 0.083% IN NEBU
2.5000 mg | INHALATION_SOLUTION | Freq: Once | RESPIRATORY_TRACT | Status: AC
  Administered 2015-06-16: 2.5 mg via RESPIRATORY_TRACT

## 2015-06-16 NOTE — Patient Instructions (Signed)
Keep using the inhaler and I think this will gradually improve.  Take care.  Glad to see you.

## 2015-06-16 NOTE — Progress Notes (Signed)
Pre visit review using our clinic review tool, if applicable. No additional management support is needed unless otherwise documented below in the visit note.  Done with abx and prednisone.  Still with SOB-worse with exertion- and cough.  Some sputum, white.  No fevers.  He feels minimally better than last week but not back to baseline.  No vomiting, no diarrhea.  He was jittery on the prednisone.  Inhaler helps some but he ran out, it lasts a few hours when used.  Cough is worse flat on his back, better on his side.   Some wheeze noted.  Last used SABA about 1-2 days ago.   Taking robitussin DM recently w/o much relief.   Meds, vitals, and allergies reviewed.   ROS: See HPI.  Otherwise, noncontributory.  GEN: nad, alert and oriented HEENT: mucous membranes moist, tm w/o erythema, nasal exam w/o erythema, clear discharge noted,  OP with cobblestoning NECK: supple w/o LA CV: rrr.   PULM: initially with B rhonchi but no inc in wob, no focal dec in BS.   EXT: trace edema  Recheck after SABA use with dec- almost zero rhonchi and improved airflow.  He reports clearly feeling better after SABA use.

## 2015-06-17 NOTE — Assessment & Plan Note (Signed)
D/w pt.  Clearly improved after SABA use in office. Would continue, technique d/w pt.  Okay for outpatient f/u.  Likely no need for steroids or abx at this point.  He agrees.  He should slowly improve.  Update [place] as needed.  He agrees.

## 2015-07-06 ENCOUNTER — Encounter (INDEPENDENT_AMBULATORY_CARE_PROVIDER_SITE_OTHER): Payer: Medicare HMO | Admitting: Ophthalmology

## 2015-07-06 DIAGNOSIS — E11311 Type 2 diabetes mellitus with unspecified diabetic retinopathy with macular edema: Secondary | ICD-10-CM | POA: Diagnosis not present

## 2015-07-06 DIAGNOSIS — H353231 Exudative age-related macular degeneration, bilateral, with active choroidal neovascularization: Secondary | ICD-10-CM

## 2015-07-06 DIAGNOSIS — E113213 Type 2 diabetes mellitus with mild nonproliferative diabetic retinopathy with macular edema, bilateral: Secondary | ICD-10-CM

## 2015-07-06 DIAGNOSIS — H43813 Vitreous degeneration, bilateral: Secondary | ICD-10-CM

## 2015-07-06 LAB — HM DIABETES EYE EXAM

## 2015-07-08 ENCOUNTER — Encounter: Payer: Self-pay | Admitting: Family Medicine

## 2015-07-15 ENCOUNTER — Ambulatory Visit: Payer: Medicare HMO | Admitting: Family Medicine

## 2015-07-16 ENCOUNTER — Encounter: Payer: Self-pay | Admitting: Family Medicine

## 2015-07-16 ENCOUNTER — Ambulatory Visit (INDEPENDENT_AMBULATORY_CARE_PROVIDER_SITE_OTHER): Payer: Medicare HMO | Admitting: Family Medicine

## 2015-07-16 VITALS — BP 110/68 | HR 57 | Temp 97.7°F | Ht 69.0 in | Wt 191.5 lb

## 2015-07-16 DIAGNOSIS — E1159 Type 2 diabetes mellitus with other circulatory complications: Secondary | ICD-10-CM | POA: Diagnosis not present

## 2015-07-16 DIAGNOSIS — N183 Chronic kidney disease, stage 3 unspecified: Secondary | ICD-10-CM | POA: Insufficient documentation

## 2015-07-16 DIAGNOSIS — E7800 Pure hypercholesterolemia, unspecified: Secondary | ICD-10-CM

## 2015-07-16 DIAGNOSIS — I10 Essential (primary) hypertension: Secondary | ICD-10-CM

## 2015-07-16 DIAGNOSIS — E1122 Type 2 diabetes mellitus with diabetic chronic kidney disease: Secondary | ICD-10-CM | POA: Diagnosis not present

## 2015-07-16 DIAGNOSIS — I129 Hypertensive chronic kidney disease with stage 1 through stage 4 chronic kidney disease, or unspecified chronic kidney disease: Secondary | ICD-10-CM

## 2015-07-16 HISTORY — DX: Type 2 diabetes mellitus with diabetic chronic kidney disease: E11.22

## 2015-07-16 LAB — BASIC METABOLIC PANEL
BUN: 20 mg/dL (ref 6–23)
CO2: 29 mEq/L (ref 19–32)
Calcium: 10.5 mg/dL (ref 8.4–10.5)
Chloride: 102 mEq/L (ref 96–112)
Creatinine, Ser: 1.37 mg/dL (ref 0.40–1.50)
GFR: 52.78 mL/min — ABNORMAL LOW (ref >60.00)
Glucose, Bld: 271 mg/dL — ABNORMAL HIGH (ref 70–99)
Potassium: 5.5 mEq/L — ABNORMAL HIGH (ref 3.5–5.1)
Sodium: 137 mEq/L (ref 135–145)

## 2015-07-16 LAB — MICROALBUMIN / CREATININE URINE RATIO
Creatinine,U: 238.5 mg/dL
Microalb Creat Ratio: 0.6 mg/g (ref 0.0–30.0)
Microalb, Ur: 1.5 mg/dL (ref 0.0–1.9)

## 2015-07-16 LAB — HEMOGLOBIN A1C: Hgb A1c MFr Bld: 8 % — ABNORMAL HIGH (ref 4.6–6.5)

## 2015-07-16 NOTE — Progress Notes (Signed)
Dr. Frederico Hamman T. Saydee Zolman, MD, [facility] [place], [address] Phone: [phone] Fax: [phone]  07/16/2015  Patient: [name], MRN: [id number], DOB: 03/16/1931, 82 y.o.  Primary Physician:  [name], MD   Chief Complaint  Patient presents with  . Follow-up    6 month   Subjective:   [name] is a 82 y.o. very pleasant male patient who presents with the following:  Diabetes Mellitus: Tolerating Medications: yes Compliance with diet: fair Exercise: minimal / intermittent Avg blood sugars at home: not checking Foot problems: none Hypoglycemia: none No nausea, vomitting, blurred vision, polyuria.  Lab Results  Component Value Date   HGBA1C 8.0* 07/16/2015   HGBA1C 7.7* 01/15/2015   HGBA1C 7.5* 07/09/2014   Lab Results  Component Value Date   MICROALBUR 1.5 07/16/2015   LDLCALC 42 07/09/2014   CREATININE 1.37 07/16/2015    Wt Readings from Last 3 Encounters:  07/16/15 191 lb 8 oz (86.864 kg)  06/16/15 187 lb 8 oz (85.049 kg)  06/08/15 189 lb 4 oz (85.843 kg)    Body mass index is 28.27 kg/(m^2).   HTN: Tolerating all medications without side effects Stable and at goal No CP, no sob. No HA.  BP Readings from Last 3 Encounters:  07/16/15 110/68  06/16/15 124/70  06/08/15 124/58    Basic Metabolic Panel:    Component Value Date/Time   NA 137 07/16/2015 1046   NA 142 07/16/2013 1059   K 5.5* 07/16/2015 1046   K 5.1 07/16/2013 1059   CL 102 07/16/2015 1046   CL 103 07/20/2012 1046   CO2 29 07/16/2015 1046   CO2 28 07/16/2013 1059   BUN 20 07/16/2015 1046   BUN 15.0 07/16/2013 1059   CREATININE 1.37 07/16/2015 1046   CREATININE 1.2 07/16/2013 1059   CREATININE 1.13 08/03/2012 1524   GLUCOSE 271* 07/16/2015 1046   GLUCOSE 199* 07/16/2013 1059   GLUCOSE 215* 07/20/2012 1046   CALCIUM 10.5 07/16/2015 1046   CALCIUM 9.5 07/16/2013 1059     Lipids: Doing well,  stable. Tolerating meds fine with no SE. Panel reviewed with patient.  Lipids:    Component Value Date/Time   CHOL 165 01/15/2015 1547   TRIG 269.0* 01/15/2015 1547   HDL 33.50* 01/15/2015 1547   LDLDIRECT 103.0 01/15/2015 1547   VLDL 53.8* 01/15/2015 1547   CHOLHDL 5 01/15/2015 1547    Lab Results  Component Value Date   ALT 10 01/15/2015   AST 11 01/15/2015   ALKPHOS 83 01/15/2015   BILITOT 0.4 01/15/2015     Past Medical History, Surgical History, Social History, Family History, Problem List, Medications, and Allergies have been reviewed and updated if relevant.  Patient Active Problem List   Diagnosis Date Noted  . CAD (coronary artery disease)     Priority: High  . Adenocarcinoma of left lung, stage 1 ([facility]) 07/25/2011    Priority: High  . Cancer of prostate w/med recur risk (T2b-c or Gleason 7 or PSA 10-20) ([facility]) 07/25/2011    Priority: High  . Type 2 diabetes mellitus with vascular disease ([facility]) 05/21/2007    Priority: High  . Pericardial tamponade 09/17/2012    Priority: Medium  . Acute pericarditis, unspecified 09/13/2012    Priority: Medium  . HYPERCHOLESTEROLEMIA 05/21/2007    Priority: Medium  . Essential hypertension 01/08/2007    Priority: Medium  . Type 2 DM with CKD stage 3 and hypertension (  [place]) 07/16/2015  . Macular degeneration of both eyes 01/21/2014  . 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 Nonmelanoma skin cancer 07/25/2011  . History of colonic polyps 07/25/2011  . VERTIGO 07/22/2008  . CARCINOMA, SKIN, SQUAMOUS CELL 01/08/2007  . Generalized anxiety disorder 01/08/2007  . ALZHEIMER'S DISEASE, MILD 01/08/2007    Past Medical History  Diagnosis Date  . Unspecified essential hypertension   . Alzheimer's disease   . Squamous cell carcinoma of skin   . Prostate CA ([facility]) 04/2001    Gleason 7  S/P prostatectomy 04/11/01  . Nonmelanoma skin cancer 07/25/2011    Multiple lesions excised face/nose  . History of colonic polyps 07/25/2011  . CAD  (coronary artery disease) 2014    a. 09/2012 Cath: LM nl, LAD 50-60p, D1 60-70 m, D1 50ost, LCX nl, RCA min irregs, EF 55-65%.  . Pericarditis     a. 09/2012 with effusion and tamponade, s/p window.  b.  F/u Echo 09/19/12: mod LVH, EF 55%, Gr 1 DD, Tr MR, mild RVE, no residual effusion  . Type 2 diabetes mellitus with vascular disease ([facility]) 05/21/2007    Qualifier: Diagnosis of  By: Diona Browner MD, [name]    . Generalized anxiety disorder 01/08/2007    Qualifier: Diagnosis of  By: Diona Browner MD, [name]    . Adenocarcinoma of left lung, stage 1 ([facility]) 2001    T1N0 stage I  adenoca left lung resected 01/03/00   . 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 Macular degeneration of both eyes 01/21/2014  . Type 2 DM with CKD stage 3 and hypertension ([facility]) 07/16/2015    Past Surgical History  Procedure Laterality Date  . Lobectomy  2001    upper left  . Prostatectomy  2003  . Hernia repair    . Tonsillectomy    . Subxyphoid pericardial window N/A 09/16/2012    Procedure: SUBXYPHOID PERICARDIAL WINDOW;  Surgeon: Rexene Alberts, MD;  Location: [facility];  Service: Thoracic;  Laterality: N/A;  . Left heart catheterization with coronary angiogram N/A 09/13/2012    Procedure: LEFT HEART CATHETERIZATION WITH CORONARY ANGIOGRAM;  Surgeon: Sherren Mocha, MD;  Location: [facility] CATH LAB;  Service: Cardiovascular;  Laterality: N/A;  . Pericardial tap N/A 09/16/2012    Procedure: PERICARDIAL TAP;  Surgeon: Sinclair Grooms, MD;  Location: [facility] CATH LAB;  Service: Cardiovascular;  Laterality: N/A;    Social History   Social History  . Marital Status: Married    Spouse Name: N/A  . Number of Children: N/A  . Years of Education: N/A   Occupational History  . retired    Social History Main Topics  . Smoking status: Former Smoker -- 2.00 packs/day for 42 years    Types: Cigarettes    Start date: 02/25/1953    Quit date: 04/04/1984  . Smokeless tobacco: Former Systems developer    Types: Railroad date: 07/27/1986     Comment: quit  tobacco 26 years ago  . Alcohol Use: No  . Drug Use: No  . Sexual Activity: Not on file   Other Topics Concern  . Not on file   Social History Narrative   Lives in [place], [place]   Regular exercise--no, mowing grass   Diet: fruit and veggies    Family History  Problem Relation Age of Onset  . Dementia Mother   . Diabetes Mother   . Cancer Father     colon  . Cancer Brother     lung  Allergies  Allergen Reactions  . Sulfa Antibiotics Nausea Only    Medication list reviewed and updated in full in [place].   GEN: No acute illnesses, no fevers, chills. GI: No n/v/d, eating normally Pulm: No SOB Interactive and getting along well at home.  Otherwise, ROS is as per the HPI.  Objective:   BP 110/68 mmHg  Pulse 57  Temp(Src) 97.7 F (36.5 C) (Oral)  Ht '5\' 9"'$  (1.753 m)  Wt 191 lb 8 oz (86.864 kg)  BMI 28.27 kg/m2  GEN: WDWN, NAD, Non-toxic, A & O x 3 HEENT: Atraumatic, Normocephalic. Neck supple. No masses, No LAD. Ears and Nose: No external deformity. CV: RRR, No M/G/R. No JVD. No thrill. No extra heart sounds. PULM: CTA B, no wheezes, crackles, rhonchi. No retractions. No resp. distress. No accessory muscle use. EXTR: No c/c/e NEURO Normal gait.  PSYCH: Normally interactive. Conversant. Not depressed or anxious appearing.  Calm demeanor.   Laboratory and Imaging Data: Results for orders placed or performed in visit on 07/16/15  Hemoglobin A1c  Result Value Ref Range   Hgb A1c MFr Bld 8.0 (H) 4.6 - 6.5 %  Basic metabolic panel  Result Value Ref Range   Sodium 137 135 - 145 mEq/L   Potassium 5.5 (H) 3.5 - 5.1 mEq/L   Chloride 102 96 - 112 mEq/L   CO2 29 19 - 32 mEq/L   Glucose, Bld 271 (H) 70 - 99 mg/dL   BUN 20 6 - 23 mg/dL   Creatinine, Ser 1.37 0.40 - 1.50 mg/dL   Calcium 10.5 8.4 - 10.5 mg/dL   GFR 52.78 (L) >60.00 mL/min  Microalbumin / creatinine urine ratio  Result Value Ref Range   Microalb, Ur 1.5 0.0 - 1.9 mg/dL   Creatinine,U  238.5 mg/dL   Microalb Creat Ratio 0.6 0.0 - 30.0 mg/g     Assessment and Plan:   Type 2 diabetes mellitus with vascular disease (HCC) - Plan: Hemoglobin K2I, Basic metabolic panel, Microalbumin / creatinine urine ratio  Essential hypertension  HYPERCHOLESTEROLEMIA  Type 2 DM with CKD stage 3 and hypertension (HCC)  Doing reasonably well right now. No c/o  Follow-up: Return in about 6 months (around 01/15/2016) for Medicare Wellness Exam.  New Prescriptions   No medications on file   Modified Medications   No medications on file   Orders Placed This Encounter  Procedures  . Hemoglobin A1c  . Basic metabolic panel  . Microalbumin / creatinine urine ratio    Signed,  Kadeidra Coryell T. Kyvon Hu, MD   Patient's Medications  New Prescriptions   No medications on file  Previous Medications   ASPIRIN EC 81 MG TABLET    Take 81 mg by mouth daily.   ATORVASTATIN (LIPITOR) 40 MG TABLET    TAKE 1 TABLET BY MOUTH DAILY   DONEPEZIL (ARICEPT) 10 MG TABLET    Take 1 tablet (10 mg total) by mouth at bedtime.   GLIPIZIDE (GLUCOTROL) 5 MG TABLET    TAKE 1 TABLET BY MOUTH DAILY BEFORE BREAKFAST   METFORMIN (GLUCOPHAGE-XR) 500 MG 24 HR TABLET    Take 1 tablet (500 mg total) by mouth 2 (two) times daily.   MULTIPLE VITAMINS-MINERALS (ICAPS LUTEIN & ZEAXANTHIN PO)    Take 1 capsule by mouth 2 (two) times daily.    OMEGA-3 FATTY ACIDS (FISH OIL) 1000 MG CAPS    Take 1,000 mg by mouth at bedtime.   SERTRALINE (ZOLOFT) 50 MG TABLET    TAKE  1 TABLET BY MOUTH DAILY   TRIAMCINOLONE CREAM (KENALOG) 0.1 %    Apply 1 application topically 2 (two) times daily.  Modified Medications   No medications on file  Discontinued Medications   ALBUTEROL (PROVENTIL HFA;VENTOLIN HFA) 108 (90 BASE) MCG/ACT INHALER    Inhale 2 puffs into the lungs every 6 (six) hours as needed for wheezing or shortness of breath.

## 2015-07-16 NOTE — Progress Notes (Signed)
Pre visit review using our clinic review tool, if applicable. No additional management support is needed unless otherwise documented below in the visit note. 

## 2015-07-29 ENCOUNTER — Other Ambulatory Visit: Payer: Self-pay | Admitting: *Deleted

## 2015-07-29 DIAGNOSIS — C3492 Malignant neoplasm of unspecified part of left bronchus or lung: Secondary | ICD-10-CM

## 2015-07-29 DIAGNOSIS — C61 Malignant neoplasm of prostate: Secondary | ICD-10-CM

## 2015-07-30 ENCOUNTER — Ambulatory Visit: Payer: Medicare HMO | Admitting: Hematology & Oncology

## 2015-07-30 ENCOUNTER — Other Ambulatory Visit: Payer: Medicare HMO

## 2015-07-30 ENCOUNTER — Encounter: Payer: Self-pay | Admitting: Family

## 2015-07-30 ENCOUNTER — Ambulatory Visit (HOSPITAL_BASED_OUTPATIENT_CLINIC_OR_DEPARTMENT_OTHER): Payer: Medicare HMO | Admitting: Family

## 2015-07-30 ENCOUNTER — Other Ambulatory Visit (HOSPITAL_BASED_OUTPATIENT_CLINIC_OR_DEPARTMENT_OTHER): Payer: Medicare HMO

## 2015-07-30 VITALS — BP 152/70 | HR 66 | Temp 97.6°F | Resp 16 | Ht 69.0 in | Wt 194.0 lb

## 2015-07-30 DIAGNOSIS — Z85118 Personal history of other malignant neoplasm of bronchus and lung: Secondary | ICD-10-CM

## 2015-07-30 DIAGNOSIS — Z8546 Personal history of malignant neoplasm of prostate: Secondary | ICD-10-CM

## 2015-07-30 DIAGNOSIS — C61 Malignant neoplasm of prostate: Secondary | ICD-10-CM

## 2015-07-30 DIAGNOSIS — C3492 Malignant neoplasm of unspecified part of left bronchus or lung: Secondary | ICD-10-CM

## 2015-07-30 DIAGNOSIS — R634 Abnormal weight loss: Secondary | ICD-10-CM | POA: Diagnosis not present

## 2015-07-30 LAB — CBC WITH DIFFERENTIAL (CANCER CENTER ONLY)
BASO#: 0 10*3/uL (ref 0.0–0.2)
BASO%: 0.6 % (ref 0.0–2.0)
EOS%: 2.1 % (ref 0.0–7.0)
Eosinophils Absolute: 0.1 10*3/uL (ref 0.0–0.5)
HCT: 41.2 % (ref 38.7–49.9)
HGB: 13.7 g/dL (ref 13.0–17.1)
LYMPH#: 1.1 10*3/uL (ref 0.9–3.3)
LYMPH%: 17.1 % (ref 14.0–48.0)
MCH: 32.9 pg (ref 28.0–33.4)
MCHC: 33.3 g/dL (ref 32.0–35.9)
MCV: 99 fL — ABNORMAL HIGH (ref 82–98)
MONO#: 0.9 10*3/uL (ref 0.1–0.9)
MONO%: 14.3 % — ABNORMAL HIGH (ref 0.0–13.0)
NEUT#: 4.2 10*3/uL (ref 1.5–6.5)
NEUT%: 65.9 % (ref 40.0–80.0)
Platelets: 137 10*3/uL — ABNORMAL LOW (ref 145–400)
RBC: 4.16 10*6/uL — ABNORMAL LOW (ref 4.20–5.70)
RDW: 12.7 % (ref 11.1–15.7)
WBC: 6.3 10*3/uL (ref 4.0–10.0)

## 2015-07-30 LAB — COMPREHENSIVE METABOLIC PANEL
ALT: 11 U/L (ref 0–55)
AST: 10 U/L (ref 5–34)
Albumin: 3.9 g/dL (ref 3.5–5.0)
Alkaline Phosphatase: 88 U/L (ref 40–150)
Anion Gap: 9 mEq/L (ref 3–11)
BUN: 14.4 mg/dL (ref 7.0–26.0)
CO2: 28 mEq/L (ref 22–29)
Calcium: 9.5 mg/dL (ref 8.4–10.4)
Chloride: 106 mEq/L (ref 98–109)
Creatinine: 1.5 mg/dL — ABNORMAL HIGH (ref 0.7–1.3)
EGFR: 44 mL/min/{1.73_m2} — ABNORMAL LOW (ref >90)
Glucose: 231 mg/dl — ABNORMAL HIGH (ref 70–140)
Potassium: 5.3 mEq/L — ABNORMAL HIGH (ref 3.5–5.1)
Sodium: 143 mEq/L (ref 136–145)
Total Bilirubin: 0.43 mg/dL (ref 0.20–1.20)
Total Protein: 6.8 g/dL (ref 6.4–8.3)

## 2015-07-30 NOTE — Progress Notes (Signed)
Hematology and Oncology Follow Up Visit  [name] [id number] Aug 11, 1930 82 y.o. 07/30/2015   Principle Diagnosis:   Stage I adenocarcinoma the left lung-2001   Early stage prostate cancer-Gleason grade 7  Squamous cell carcinoma of the skin  Current Therapy:   Observation     Interim History:  [name] is here today with his wife for his annual follow-up. He is doing well and has no complaints at this time.  He states that his PCP is monitoring his PSA and that he did not follow-up with urology. His PSA in April of last year was <0.01. He had a prostatectomy in 2003.  No fever, chills, n/v, cough, rash, dizziness, chest pain, palpitations, abdominal pain or changes in bowel or bladder habits.  He has some SOB with exertion since his left upper lobectomy in 2001. He describes this as tolerable and he will rest as needed until it resolves.  No lymphadenopathy found on exam. No episodes of bleeding. He is on 1 baby aspirin daily and does bruise easily.  No swelling, tenderness, numbness or tingling in his extremities. No new aches or pains noted.  He has maintained a good appetite and is staying well hydrated. His weight is stable.   Medications:    Medication List       This list is accurate as of: 07/30/15 10:34 AM.  Always use your most recent med list.               aspirin EC 81 MG tablet  Take 81 mg by mouth daily.     atorvastatin 40 MG tablet  Commonly known as:  LIPITOR  TAKE 1 TABLET BY MOUTH DAILY     donepezil 10 MG tablet  Commonly known as:  ARICEPT  Take 1 tablet (10 mg total) by mouth at bedtime.     Fish Oil 1000 MG Caps  Take 1,000 mg by mouth at bedtime.     glipiZIDE 5 MG tablet  Commonly known as:  GLUCOTROL  TAKE 1 TABLET BY MOUTH DAILY BEFORE BREAKFAST     ICAPS LUTEIN & ZEAXANTHIN PO  Take 1 capsule by mouth 2 (two) times daily.     metFORMIN 500 MG 24 hr tablet  Commonly known as:  GLUCOPHAGE-XR  Take 1 tablet (500 mg  total) by mouth 2 (two) times daily.     sertraline 50 MG tablet  Commonly known as:  ZOLOFT  TAKE 1 TABLET BY MOUTH DAILY     triamcinolone cream 0.1 %  Commonly known as:  KENALOG  Apply 1 application topically 2 (two) times daily.        Allergies:  Allergies  Allergen Reactions  . Sulfa Antibiotics Nausea Only    Past Medical History, Surgical history, Social history, and Family History were reviewed and updated.  Review of Systems: All other 10 point review of systems is negative.   Physical Exam:  height is '5\' 9"'$  (1.753 m) and weight is 194 lb (87.998 kg). His oral temperature is 97.6 F (36.4 C). His blood pressure is 152/70 and his pulse is 66. His respiration is 16.   Wt Readings from Last 3 Encounters:  07/30/15 194 lb (87.998 kg)  07/16/15 191 lb 8 oz (86.864 kg)  06/16/15 187 lb 8 oz (85.049 kg)    Ocular: Sclerae unicteric, pupils equal, round and reactive to light Ear-nose-throat: Oropharynx clear, dentition fair Lymphatic: No cervical supraclavicular or axillary adenopathy Lungs no rales or rhonchi, good excursion bilaterally  Heart regular rate and rhythm, no murmur appreciated Abd soft, nontender, positive bowel sounds, no liver or spleen tip palpated on exam, no fluid wave MSK no focal spinal tenderness, no joint edema Neuro: non-focal, well-oriented, appropriate affect Breasts: Deferred  Lab Results  Component Value Date   WBC 6.3 07/30/2015   HGB 13.7 07/30/2015   HCT 41.2 07/30/2015   MCV 99* 07/30/2015   PLT 137* 07/30/2015   No results found for: FERRITIN, IRON, TIBC, UIBC, IRONPCTSAT Lab Results  Component Value Date   RBC 4.16* 07/30/2015   No results found for: KPAFRELGTCHN, LAMBDASER, KAPLAMBRATIO No results found for: IGGSERUM, IGA, IGMSERUM No results found for: Odetta Pink, SPEI   Chemistry      Component Value Date/Time   NA 137 07/16/2015 1046   NA 142 07/16/2013 1059     K 5.5* 07/16/2015 1046   K 5.1 07/16/2013 1059   CL 102 07/16/2015 1046   CL 103 07/20/2012 1046   CO2 29 07/16/2015 1046   CO2 28 07/16/2013 1059   BUN 20 07/16/2015 1046   BUN 15.0 07/16/2013 1059   CREATININE 1.37 07/16/2015 1046   CREATININE 1.2 07/16/2013 1059   CREATININE 1.13 08/03/2012 1524      Component Value Date/Time   CALCIUM 10.5 07/16/2015 1046   CALCIUM 9.5 07/16/2013 1059   ALKPHOS 83 01/15/2015 1547   ALKPHOS 97 07/16/2013 1059   AST 11 01/15/2015 1547   AST 11 07/16/2013 1059   ALT 10 01/15/2015 1547   ALT 9 07/16/2013 1059   BILITOT 0.4 01/15/2015 1547   BILITOT 0.38 07/16/2013 1059     Impression and Plan: [name] is a pleasant 82 yo whit male with history of stage I adenocarcinoma of the left lung with lobectomy in 2001 and also prostates cancer with prostatectomy in 2003. So far, he has done well with no evidence of recurrence and is asymptomatic at this time.   He declined PSA testing today. His testosterone level is pending.  He is now more than 14 years out from diagnosis of both cancers. We will release him from our office at this time. He will follow-up with [place] as needed in the future. We will be happy to see him for any future hem/onc needs.  Both he and his wife are in agreement with this.   Eliezer Bottom, NP 4/27/201710:34 AM

## 2015-07-31 LAB — TESTOSTERONE: Testosterone, Serum: 216 ng/dL — ABNORMAL LOW (ref 348–1197)

## 2015-08-25 ENCOUNTER — Telehealth: Payer: Self-pay

## 2015-08-25 ENCOUNTER — Telehealth: Payer: Self-pay | Admitting: *Deleted

## 2015-08-25 NOTE — Telephone Encounter (Signed)
Received fax from [facility] requesting order for Diabetic testing supplies. I called and spoke with Mrs. Hanrahan who confirmed that they did request diabetic testing supplies from [facility].  Form completed and placed in Dr. Lillie Fragmin in box for signature.

## 2015-08-25 NOTE — Telephone Encounter (Signed)
Patient is on my [employer] list for 2017. Pt may be a good candidate for an AWV for 2017 

## 2015-08-26 NOTE — Telephone Encounter (Signed)
Per transaction inquiry, pt will not be due an AWV until 01/2016.

## 2015-08-27 DIAGNOSIS — E119 Type 2 diabetes mellitus without complications: Secondary | ICD-10-CM | POA: Diagnosis not present

## 2015-09-09 ENCOUNTER — Other Ambulatory Visit: Payer: Self-pay | Admitting: Family Medicine

## 2015-09-28 ENCOUNTER — Encounter (INDEPENDENT_AMBULATORY_CARE_PROVIDER_SITE_OTHER): Payer: Medicare HMO | Admitting: Ophthalmology

## 2015-09-28 DIAGNOSIS — H43813 Vitreous degeneration, bilateral: Secondary | ICD-10-CM

## 2015-09-28 DIAGNOSIS — E113213 Type 2 diabetes mellitus with mild nonproliferative diabetic retinopathy with macular edema, bilateral: Secondary | ICD-10-CM | POA: Diagnosis not present

## 2015-09-28 DIAGNOSIS — H353231 Exudative age-related macular degeneration, bilateral, with active choroidal neovascularization: Secondary | ICD-10-CM | POA: Diagnosis not present

## 2015-09-28 DIAGNOSIS — E11311 Type 2 diabetes mellitus with unspecified diabetic retinopathy with macular edema: Secondary | ICD-10-CM | POA: Diagnosis not present

## 2015-10-12 DIAGNOSIS — H353211 Exudative age-related macular degeneration, right eye, with active choroidal neovascularization: Secondary | ICD-10-CM | POA: Diagnosis not present

## 2015-10-12 DIAGNOSIS — H40013 Open angle with borderline findings, low risk, bilateral: Secondary | ICD-10-CM | POA: Diagnosis not present

## 2015-10-12 DIAGNOSIS — H353221 Exudative age-related macular degeneration, left eye, with active choroidal neovascularization: Secondary | ICD-10-CM | POA: Diagnosis not present

## 2015-10-12 DIAGNOSIS — Z961 Presence of intraocular lens: Secondary | ICD-10-CM | POA: Diagnosis not present

## 2015-10-12 DIAGNOSIS — E119 Type 2 diabetes mellitus without complications: Secondary | ICD-10-CM | POA: Diagnosis not present

## 2015-10-12 DIAGNOSIS — H53412 Scotoma involving central area, left eye: Secondary | ICD-10-CM | POA: Diagnosis not present

## 2015-10-26 ENCOUNTER — Other Ambulatory Visit: Payer: Self-pay | Admitting: Dermatology

## 2015-10-26 DIAGNOSIS — L821 Other seborrheic keratosis: Secondary | ICD-10-CM | POA: Diagnosis not present

## 2015-10-26 DIAGNOSIS — D239 Other benign neoplasm of skin, unspecified: Secondary | ICD-10-CM | POA: Diagnosis not present

## 2015-10-26 DIAGNOSIS — D485 Neoplasm of uncertain behavior of skin: Secondary | ICD-10-CM | POA: Diagnosis not present

## 2015-10-26 DIAGNOSIS — L570 Actinic keratosis: Secondary | ICD-10-CM | POA: Diagnosis not present

## 2015-11-02 ENCOUNTER — Other Ambulatory Visit: Payer: Self-pay | Admitting: Family Medicine

## 2015-11-19 DIAGNOSIS — E1122 Type 2 diabetes mellitus with diabetic chronic kidney disease: Secondary | ICD-10-CM | POA: Diagnosis not present

## 2015-11-30 ENCOUNTER — Encounter (INDEPENDENT_AMBULATORY_CARE_PROVIDER_SITE_OTHER): Payer: Medicare HMO | Admitting: Ophthalmology

## 2015-11-30 DIAGNOSIS — E113213 Type 2 diabetes mellitus with mild nonproliferative diabetic retinopathy with macular edema, bilateral: Secondary | ICD-10-CM

## 2015-11-30 DIAGNOSIS — E11311 Type 2 diabetes mellitus with unspecified diabetic retinopathy with macular edema: Secondary | ICD-10-CM | POA: Diagnosis not present

## 2015-11-30 DIAGNOSIS — H353231 Exudative age-related macular degeneration, bilateral, with active choroidal neovascularization: Secondary | ICD-10-CM

## 2015-11-30 DIAGNOSIS — H43813 Vitreous degeneration, bilateral: Secondary | ICD-10-CM

## 2015-12-10 ENCOUNTER — Other Ambulatory Visit: Payer: Self-pay | Admitting: Family Medicine

## 2016-01-29 ENCOUNTER — Ambulatory Visit (INDEPENDENT_AMBULATORY_CARE_PROVIDER_SITE_OTHER): Payer: Medicare HMO

## 2016-01-29 DIAGNOSIS — Z23 Encounter for immunization: Secondary | ICD-10-CM | POA: Diagnosis not present

## 2016-02-08 ENCOUNTER — Encounter (INDEPENDENT_AMBULATORY_CARE_PROVIDER_SITE_OTHER): Payer: Medicare HMO | Admitting: Ophthalmology

## 2016-02-08 DIAGNOSIS — E11311 Type 2 diabetes mellitus with unspecified diabetic retinopathy with macular edema: Secondary | ICD-10-CM | POA: Diagnosis not present

## 2016-02-08 DIAGNOSIS — E113313 Type 2 diabetes mellitus with moderate nonproliferative diabetic retinopathy with macular edema, bilateral: Secondary | ICD-10-CM | POA: Diagnosis not present

## 2016-02-08 DIAGNOSIS — H43813 Vitreous degeneration, bilateral: Secondary | ICD-10-CM | POA: Diagnosis not present

## 2016-02-08 DIAGNOSIS — H353231 Exudative age-related macular degeneration, bilateral, with active choroidal neovascularization: Secondary | ICD-10-CM

## 2016-03-07 ENCOUNTER — Other Ambulatory Visit: Payer: Self-pay | Admitting: Family Medicine

## 2016-04-25 ENCOUNTER — Encounter: Payer: Self-pay | Admitting: *Deleted

## 2016-04-25 ENCOUNTER — Encounter (INDEPENDENT_AMBULATORY_CARE_PROVIDER_SITE_OTHER): Payer: Medicare HMO | Admitting: Ophthalmology

## 2016-04-25 DIAGNOSIS — H353231 Exudative age-related macular degeneration, bilateral, with active choroidal neovascularization: Secondary | ICD-10-CM | POA: Diagnosis not present

## 2016-04-25 DIAGNOSIS — E113292 Type 2 diabetes mellitus with mild nonproliferative diabetic retinopathy without macular edema, left eye: Secondary | ICD-10-CM

## 2016-04-25 DIAGNOSIS — H43813 Vitreous degeneration, bilateral: Secondary | ICD-10-CM | POA: Diagnosis not present

## 2016-04-25 DIAGNOSIS — E11311 Type 2 diabetes mellitus with unspecified diabetic retinopathy with macular edema: Secondary | ICD-10-CM | POA: Diagnosis not present

## 2016-04-25 DIAGNOSIS — E113211 Type 2 diabetes mellitus with mild nonproliferative diabetic retinopathy with macular edema, right eye: Secondary | ICD-10-CM | POA: Diagnosis not present

## 2016-04-25 LAB — HM DIABETES EYE EXAM

## 2016-05-09 ENCOUNTER — Other Ambulatory Visit: Payer: Self-pay | Admitting: Family Medicine

## 2016-05-09 ENCOUNTER — Telehealth: Payer: Self-pay | Admitting: *Deleted

## 2016-05-09 NOTE — Telephone Encounter (Signed)
Last office visit 07/16/2015.  Last Lipid 01/15/2015.  Refill?

## 2016-05-09 NOTE — Telephone Encounter (Signed)
Received fax from [employer] [facility] for diabetic testing supplies.  Spoke with wife last week and they are changing to [employer] [facility] for testing supplies.  Form completed and faxed by to [phone].

## 2016-05-10 NOTE — Telephone Encounter (Signed)
Mrs. Sortino notified by telephone that [name] would like Mr. Baumgardner to schedule a AWV in the spring.  Mrs. Everage states she will call back to make that appointment.

## 2016-05-10 NOTE — Telephone Encounter (Signed)
Ok to ref, 90, 1 ref  Please have then set up a medicare wellness exam with me this spring

## 2016-05-20 ENCOUNTER — Telehealth: Payer: Self-pay | Admitting: *Deleted

## 2016-05-20 NOTE — Telephone Encounter (Signed)
Received PA request for [facility] for [facility] Inhaler.  Medication is not on patient's current medication list.  Went to CoverMyMeds to attempt PA but it looks like PA was already completed and denied.  [facility] notified via fax of denial.

## 2016-05-30 DIAGNOSIS — E119 Type 2 diabetes mellitus without complications: Secondary | ICD-10-CM | POA: Diagnosis not present

## 2016-06-04 ENCOUNTER — Other Ambulatory Visit: Payer: Self-pay | Admitting: Family Medicine

## 2016-07-11 ENCOUNTER — Encounter (INDEPENDENT_AMBULATORY_CARE_PROVIDER_SITE_OTHER): Payer: Medicare HMO | Admitting: Ophthalmology

## 2016-07-11 DIAGNOSIS — H43813 Vitreous degeneration, bilateral: Secondary | ICD-10-CM

## 2016-07-11 DIAGNOSIS — E113293 Type 2 diabetes mellitus with mild nonproliferative diabetic retinopathy without macular edema, bilateral: Secondary | ICD-10-CM

## 2016-07-11 DIAGNOSIS — E11319 Type 2 diabetes mellitus with unspecified diabetic retinopathy without macular edema: Secondary | ICD-10-CM

## 2016-07-11 DIAGNOSIS — H353231 Exudative age-related macular degeneration, bilateral, with active choroidal neovascularization: Secondary | ICD-10-CM

## 2016-08-04 ENCOUNTER — Other Ambulatory Visit: Payer: Self-pay | Admitting: Family Medicine

## 2016-08-10 ENCOUNTER — Encounter (INDEPENDENT_AMBULATORY_CARE_PROVIDER_SITE_OTHER): Payer: Medicare HMO | Admitting: Ophthalmology

## 2016-08-10 DIAGNOSIS — E113293 Type 2 diabetes mellitus with mild nonproliferative diabetic retinopathy without macular edema, bilateral: Secondary | ICD-10-CM

## 2016-08-10 DIAGNOSIS — H353131 Nonexudative age-related macular degeneration, bilateral, early dry stage: Secondary | ICD-10-CM

## 2016-08-10 DIAGNOSIS — H43813 Vitreous degeneration, bilateral: Secondary | ICD-10-CM

## 2016-08-10 DIAGNOSIS — E11319 Type 2 diabetes mellitus with unspecified diabetic retinopathy without macular edema: Secondary | ICD-10-CM | POA: Diagnosis not present

## 2016-08-15 ENCOUNTER — Ambulatory Visit (INDEPENDENT_AMBULATORY_CARE_PROVIDER_SITE_OTHER): Payer: Medicare HMO

## 2016-08-15 VITALS — BP 124/76 | HR 75 | Temp 98.1°F | Ht 68.25 in | Wt 188.5 lb

## 2016-08-15 DIAGNOSIS — E7800 Pure hypercholesterolemia, unspecified: Secondary | ICD-10-CM | POA: Diagnosis not present

## 2016-08-15 DIAGNOSIS — E1159 Type 2 diabetes mellitus with other circulatory complications: Secondary | ICD-10-CM

## 2016-08-15 DIAGNOSIS — I10 Essential (primary) hypertension: Secondary | ICD-10-CM

## 2016-08-15 DIAGNOSIS — Z0000 Encounter for general adult medical examination without abnormal findings: Secondary | ICD-10-CM

## 2016-08-15 LAB — COMPREHENSIVE METABOLIC PANEL
ALT: 14 U/L (ref 0–53)
AST: 13 U/L (ref 0–37)
Albumin: 4.6 g/dL (ref 3.5–5.2)
Alkaline Phosphatase: 82 U/L (ref 39–117)
BUN: 19 mg/dL (ref 6–23)
CO2: 30 mEq/L (ref 19–32)
Calcium: 9.7 mg/dL (ref 8.4–10.5)
Chloride: 103 mEq/L (ref 96–112)
Creatinine, Ser: 1.28 mg/dL (ref 0.40–1.50)
GFR: 56.93 mL/min — ABNORMAL LOW (ref >60.00)
Glucose, Bld: 148 mg/dL — ABNORMAL HIGH (ref 70–99)
Potassium: 5.2 mEq/L — ABNORMAL HIGH (ref 3.5–5.1)
Sodium: 139 mEq/L (ref 135–145)
Total Bilirubin: 0.6 mg/dL (ref 0.2–1.2)
Total Protein: 7.2 g/dL (ref 6.0–8.3)

## 2016-08-15 LAB — CBC WITH DIFFERENTIAL/PLATELET
Basophils Absolute: 0 10*3/uL (ref 0.0–0.1)
Basophils Relative: 0.3 % (ref 0.0–3.0)
Eosinophils Absolute: 0.1 10*3/uL (ref 0.0–0.7)
Eosinophils Relative: 1.1 % (ref 0.0–5.0)
HCT: 42 % (ref 39.0–52.0)
Hemoglobin: 14.1 g/dL (ref 13.0–17.0)
Lymphocytes Relative: 10.6 % — ABNORMAL LOW (ref 12.0–46.0)
Lymphs Abs: 0.9 10*3/uL (ref 0.7–4.0)
MCHC: 33.5 g/dL (ref 30.0–36.0)
MCV: 96.9 fl (ref 78.0–100.0)
Monocytes Absolute: 0.8 10*3/uL (ref 0.1–1.0)
Monocytes Relative: 9 % (ref 3.0–12.0)
Neutro Abs: 7 10*3/uL (ref 1.4–7.7)
Neutrophils Relative %: 79 % — ABNORMAL HIGH (ref 43.0–77.0)
Platelets: 168 10*3/uL (ref 150.0–400.0)
RBC: 4.33 Mil/uL (ref 4.22–5.81)
RDW: 13 % (ref 11.5–15.5)
WBC: 8.9 10*3/uL (ref 4.0–10.5)

## 2016-08-15 LAB — LIPID PANEL
Cholesterol: 112 mg/dL (ref 0–200)
HDL: 34.1 mg/dL — ABNORMAL LOW (ref >39.00)
NonHDL: 78.29
Total CHOL/HDL Ratio: 3
Triglycerides: 219 mg/dL — ABNORMAL HIGH (ref 0.0–149.0)
VLDL: 43.8 mg/dL — ABNORMAL HIGH (ref 0.0–40.0)

## 2016-08-15 LAB — HEMOGLOBIN A1C: Hgb A1c MFr Bld: 7.5 % — ABNORMAL HIGH (ref 4.6–6.5)

## 2016-08-15 LAB — MICROALBUMIN / CREATININE URINE RATIO
Creatinine,U: 194.2 mg/dL
Microalb Creat Ratio: 1.2 mg/g (ref 0.0–30.0)
Microalb, Ur: 2.3 mg/dL — ABNORMAL HIGH (ref 0.0–1.9)

## 2016-08-15 LAB — LDL CHOLESTEROL, DIRECT: Direct LDL: 56 mg/dL

## 2016-08-15 NOTE — Progress Notes (Signed)
Pre visit review using our clinic review tool, if applicable. No additional management support is needed unless otherwise documented below in the visit note. 

## 2016-08-15 NOTE — Patient Instructions (Signed)
[name] , Thank you for taking time to come for your Medicare Wellness Visit. I appreciate your ongoing commitment to your health goals. Please review the following plan we discussed and let me know if I can assist you in the future.   These are the goals we discussed: Goals    . Increase physical activity          Starting 08/15/16, I will continue to walk at least 10 min 1-2 times per day.        This is a list of the screening recommended for you and due dates:  Health Maintenance  Topic Date Due  . Complete foot exam   08/22/2016*  . Flu Shot  11/02/2016  . Hemoglobin A1C  02/15/2017  . Eye exam for diabetics  04/25/2017  . Urine Protein Check  08/15/2017  . Tetanus Vaccine  01/26/2023  . Pneumonia vaccines  Completed  *Topic was postponed. The date shown is not the original due date.   Preventive Care for Adults  A healthy lifestyle and preventive care can promote health and wellness. Preventive health guidelines for adults include the following key practices.  . A routine yearly physical is a good way to check with your health care provider about your health and preventive screening. It is a chance to share any concerns and updates on your health and to receive a thorough exam.  . Visit your dentist for a routine exam and preventive care every 6 months. Brush your teeth twice a day and floss once a day. Good oral hygiene prevents tooth decay and gum disease.  . The frequency of eye exams is based on your age, health, family medical history, use  of contact lenses, and other factors. Follow your health care provider's ecommendations for frequency of eye exams.  . Eat a healthy diet. Foods like vegetables, fruits, whole grains, low-fat dairy products, and lean protein foods contain the nutrients you need without too many calories. Decrease your intake of foods high in solid fats, added sugars, and salt. Eat the right amount of calories for you. Get information about a  proper diet from your health care provider, if necessary.  . Regular physical exercise is one of the most important things you can do for your health. Most adults should get at least 150 minutes of moderate-intensity exercise (any activity that increases your heart rate and causes you to sweat) each week. In addition, most adults need muscle-strengthening exercises on 2 or more days a week.  Silver Sneakers may be a benefit available to you. To determine eligibility, you may visit the website: www.silversneakers.com or contact program at 365-008-0705 Mon-Fri between 8AM-8PM.   . Maintain a healthy weight. The body mass index (BMI) is a screening tool to identify possible weight problems. It provides an estimate of body fat based on height and weight. Your health care provider can find your BMI and can help you achieve or maintain a healthy weight.   For adults 20 years and older: ? A BMI below 18.5 is considered underweight. ? A BMI of 18.5 to 24.9 is normal. ? A BMI of 25 to 29.9 is considered overweight. ? A BMI of 30 and above is considered obese.   . Maintain normal blood lipids and cholesterol levels by exercising and minimizing your intake of saturated fat. Eat a balanced diet with plenty of fruit and vegetables. Blood tests for lipids and cholesterol should begin at age 66 and be repeated every 5 years. If  your lipid or cholesterol levels are high, you are over 50, or you are at high risk for heart disease, you may need your cholesterol levels checked more frequently. Ongoing high lipid and cholesterol levels should be treated with medicines if diet and exercise are not working.  . If you smoke, find out from your health care provider how to quit. If you do not use tobacco, please do not start.  . If you choose to drink alcohol, please do not consume more than 2 drinks per day. One drink is considered to be 12 ounces (355 mL) of beer, 5 ounces (148 mL) of wine, or 1.5 ounces (44 mL) of  liquor.  . If you are 22-53 years old, ask your health care provider if you should take aspirin to prevent strokes.  . Use sunscreen. Apply sunscreen liberally and repeatedly throughout the day. You should seek shade when your shadow is shorter than you. Protect yourself by wearing long sleeves, pants, a wide-brimmed hat, and sunglasses year round, whenever you are outdoors.  . Once a month, do a whole body skin exam, using a mirror to look at the skin on your back. Tell your health care provider of new moles, moles that have irregular borders, moles that are larger than a pencil eraser, or moles that have changed in shape or color.

## 2016-08-15 NOTE — Progress Notes (Signed)
PCP notes:   Health maintenance:  A1C - completed Urine microalbumin - completed Foot exam - PCP please address at next appt  Abnormal screenings:   None  Patient concerns:   None  Nurse concerns:  Last blood glucose reading was 347 in AM 08/15/16.   Next PCP appt:   08/22/16 @ 1030

## 2016-08-15 NOTE — Progress Notes (Signed)
Subjective:   [name] is a 83 y.o. male who presents for Medicare Annual/Subsequent preventive examination.  Review of Systems:  N/A Cardiac Risk Factors include: advanced age (>24mn, >>73women);diabetes mellitus;dyslipidemia;hypertension;male gender     Objective:    Vitals: BP 124/76 (BP Location: Right Arm, Patient Position: Sitting, Cuff Size: Normal)   Pulse 75   Temp 98.1 F (36.7 C) (Oral)   Ht 5' 8.25" (1.734 m) Comment: no shoes  Wt 188 lb 8 oz (85.5 kg)   SpO2 96%   BMI 28.45 kg/m   Body mass index is 28.45 kg/m.  Tobacco History  Smoking Status  . Former Smoker  . Packs/day: 2.00  . Years: 42.00  . Types: Cigarettes  . Start date: 02/25/1953  . Quit date: 04/04/1984  Smokeless Tobacco  . Former USystems developer . Types: Chew  . Quit date: 07/27/1986    Comment: quit tobacco 26 years ago     Counseling given: No   Past Medical History:  Diagnosis Date  . Adenocarcinoma of left lung, stage 1 ([place] 2001   T1N0 stage I  adenoca left lung resected 01/03/00   . Alzheimer's disease   . CAD (coronary artery disease) 2014   a. 09/2012 Cath: LM nl, LAD 50-60p, D1 60-70 m, D1 50ost, LCX nl, RCA min irregs, EF 55-65%.  . Generalized anxiety disorder 01/08/2007   Qualifier: Diagnosis of  By: BDiona BrownerMD, [name]    . History of colonic polyps 07/25/2011  . Macular degeneration of both eyes 01/21/2014  . Nonmelanoma skin cancer 07/25/2011   Multiple lesions excised face/nose  . Pericarditis    a. 09/2012 with effusion and tamponade, s/p window.  b.  F/u Echo 09/19/12: mod LVH, EF 55%, Gr 1 DD, Tr MR, mild RVE, no residual effusion  . Prostate CA ([place] 04[place]   Gleason 7  S/P prostatectomy 04/11/01  . Squamous cell carcinoma of skin   . 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 Type 2 diabetes mellitus with vascular disease ([facility] 05/21/2007   Qualifier: Diagnosis of  By: BDiona BrownerMD, [name]    . Type 2 DM with CKD stage 3 and hypertension ([facility] 07/16/2015  . Unspecified essential  hypertension    Past Surgical History:  Procedure Laterality Date  . HERNIA REPAIR    . LEFT HEART CATHETERIZATION WITH CORONARY ANGIOGRAM N/A 09/13/2012   Procedure: LEFT HEART CATHETERIZATION WITH CORONARY ANGIOGRAM;  Surgeon: MSherren Mocha MD;  [facility];  Service: Cardiovascular;  Laterality: N/A;  . LOBECTOMY  2001   upper left  . PERICARDIAL TAP N/A 09/16/2012   Procedure: PERICARDIAL TAP;  Surgeon: HSinclair Grooms MD;  [facility];  Service: Cardiovascular;  Laterality: N/A;  . PROSTATECTOMY  2003  . SUBXYPHOID PERICARDIAL WINDOW N/A 09/16/2012   Procedure: SUBXYPHOID PERICARDIAL WINDOW;  Surgeon: CRexene Alberts MD;  [facility];  Service: Thoracic;  Laterality: N/A;  . TONSILLECTOMY     Family History  Problem Relation Age of Onset  . Cancer Father        colon  . Dementia Mother   . Diabetes Mother   . Cancer Brother        lung   History  Sexual Activity  . Sexual activity: Not on file    Outpatient Encounter Prescriptions as of 08/15/2016  Medication Sig  . aspirin EC 81 MG tablet Take 81 mg by mouth daily.  .Marland Kitchenatorvastatin (LIPITOR) 40 MG tablet TAKE 1  TABLET BY MOUTH DAILY  . donepezil (ARICEPT) 10 MG tablet Take 1 tablet (10 mg total) by mouth at bedtime.  Marland Kitchen glipiZIDE (GLUCOTROL) 5 MG tablet TAKE ONE TABLET BY MOUTH EVERY MORNING BEFORE BREAKFAST  . metFORMIN (GLUCOPHAGE-XR) 500 MG 24 hr tablet TAKE 1 TABLET BY MOUTH TWICE A DAY  . Multiple Vitamins-Minerals (ICAPS LUTEIN & ZEAXANTHIN PO) Take 1 capsule by mouth 2 (two) times daily.   . Omega-3 Fatty Acids (FISH OIL) 1000 MG CAPS Take 1,000 mg by mouth at bedtime.  . sertraline (ZOLOFT) 50 MG tablet TAKE 1 TABLET BY MOUTH ONCE A DAY *NEED OFFICE VISIT  . triamcinolone cream (KENALOG) 0.1 % Apply 1 application topically 2 (two) times daily.   No facility-administered encounter medications on file as of 08/15/2016.     Activities of Daily Living In your present state of health, do  you have any difficulty performing the following activities: 08/15/2016  Hearing? Y  Vision? Y  Difficulty concentrating or making decisions? Y  Walking or climbing stairs? Y  Dressing or bathing? N  Doing errands, shopping? Y  Preparing Food and eating ? Y  Using the Toilet? N  In the past six months, have you accidently leaked urine? Y  Do you have problems with loss of bowel control? Y  Managing your Medications? N  Managing your Finances? Y  Housekeeping or managing your Housekeeping? Y  Some recent data might be hidden    Patient Care Team: [name], MD as PCP - General (Family Medicine)   Assessment:    Hearing Screening Comments: Bilateral hearing aids Vision Screening Comments: Monthly injections with Dr. Zigmund Daniel  Exercise Activities and Dietary recommendations Current Exercise Habits: Home exercise routine, Type of exercise: walking, Time (Minutes): 10, Frequency (Times/Week): 7, Weekly Exercise (Minutes/Week): 70, Intensity: Mild, Exercise limited by: None identified  Goals    . Increase physical activity          Starting 08/15/16, I will continue to walk at least 10 min 1-2 times per day.       Fall Risk Fall Risk  08/15/2016 01/15/2015 07/31/2014 07/26/2013  Falls in the past year? No No No No   Depression Screen PHQ 2/9 Scores 08/15/2016 01/15/2015  PHQ - 2 Score 0 0    Cognitive Function MMSE - Mini Mental State Exam 08/15/2016  Orientation to time 5  Orientation to Place 5  Registration 3  Attention/ Calculation 0  Recall 3  Language- name 2 objects 0  Language- repeat 1  Language- follow 3 step command 3  Language- read & follow direction 0  Write a sentence 0  Copy design 0  Total score 20     PLEASE NOTE: A Mini-Cog screen was completed. Maximum score is 20. A value of 0 denotes this part of [place] MMSE was not completed or the patient failed this part of the Mini-Cog screening.   Mini-Cog Screening Orientation to Time - Max 5  pts Orientation to Place - Max 5 pts Registration - Max 3 pts Recall - Max 3 pts Language Repeat - Max 1 pts Language Follow 3 Step Command - Max 3 pts     Immunization History  Administered Date(s) Administered  . Influenza Whole 01/03/2007  . Influenza,inj,Quad PF,36+ Mos 02/06/2013, 01/20/2014, 01/15/2015, 01/29/2016  . Pneumococcal Conjugate-13 01/20/2014  . Pneumococcal Polysaccharide-23 01/02/2001  . Td 04/04/1994  . Tdap 01/25/2013   Screening Tests Health Maintenance  Topic Date Due  . FOOT EXAM  08/22/2016 (Originally 01/18/2016)  .  INFLUENZA VACCINE  11/02/2016  . HEMOGLOBIN A1C  02/15/2017  . OPHTHALMOLOGY EXAM  04/25/2017  . URINE MICROALBUMIN  08/15/2017  . TETANUS/TDAP  01/26/2023  . PNA vac Low Risk Adult  Completed      Plan:   I have personally reviewed and addressed the Medicare Annual Wellness questionnaire and have noted the following in the patient's chart:  A. Medical and social history B. Use of alcohol, tobacco or illicit drugs  C. Current medications and supplements D. Functional ability and status E.  Nutritional status F.  Physical activity G. Advance directives H. List of other physicians I.  Hospitalizations, surgeries, and ER visits in previous 12 months J.  [place] to include hearing, vision, cognitive, depression L. Referrals and appointments - none  In addition, I have reviewed and discussed with patient certain preventive protocols, quality metrics, and best practice recommendations. A written personalized care plan for preventive services as well as general preventive health recommendations were provided to patient.  See attached scanned questionnaire for additional information.   Signed,   Lindell Noe, MHA, BS, LPN Health Coach

## 2016-08-15 NOTE — Progress Notes (Signed)
I reviewed health advisor's note, was available for consultation, and agree with documentation and plan.  

## 2016-08-22 ENCOUNTER — Encounter: Payer: Medicare HMO | Admitting: Family Medicine

## 2016-08-30 ENCOUNTER — Other Ambulatory Visit: Payer: Self-pay | Admitting: Family Medicine

## 2016-09-03 DIAGNOSIS — E119 Type 2 diabetes mellitus without complications: Secondary | ICD-10-CM | POA: Diagnosis not present

## 2016-09-05 ENCOUNTER — Ambulatory Visit (INDEPENDENT_AMBULATORY_CARE_PROVIDER_SITE_OTHER): Payer: Medicare HMO | Admitting: Family Medicine

## 2016-09-05 ENCOUNTER — Encounter: Payer: Self-pay | Admitting: Family Medicine

## 2016-09-05 VITALS — BP 130/80 | HR 67 | Temp 97.3°F | Ht 68.25 in | Wt 188.0 lb

## 2016-09-05 DIAGNOSIS — L989 Disorder of the skin and subcutaneous tissue, unspecified: Secondary | ICD-10-CM | POA: Diagnosis not present

## 2016-09-05 DIAGNOSIS — Z0001 Encounter for general adult medical examination with abnormal findings: Secondary | ICD-10-CM

## 2016-09-05 NOTE — Progress Notes (Signed)
Dr. Frederico Hamman T. Travez Stancil, MD, [facility] [place], [address] Phone: [phone] Fax: [phone]  09/05/2016  Patient: [name], MRN: [id number], DOB: Oct 30, 1930, 83 y.o.  Primary Physician:  [name], MD   Chief Complaint  Patient presents with  . Annual Exam    Part 2   Subjective:   [name] is a 83 y.o. pleasant patient who presents with the following:  Preventative Health Maintenance Visit:  Health Maintenance Summary Reviewed and updated, unless pt declines services.  Tobacco History Reviewed. Alcohol: No concerns, no excessive use Exercise Habits: Some activity, rec at least 30 mins 5 times a week STD concerns: no risk or activity to increase risk Drug Use: None Encouraged self-testicular check  No vomitting -  Ate some seafood  Yesterday afternoon ate a hamburger.  This morning nausea is better.   Health Maintenance  Topic Date Due  . FOOT EXAM  01/18/2016  . INFLUENZA VACCINE  11/02/2016  . HEMOGLOBIN A1C  02/15/2017  . OPHTHALMOLOGY EXAM  04/25/2017  . URINE MICROALBUMIN  08/15/2017  . TETANUS/TDAP  01/26/2023  . PNA vac Low Risk Adult  Completed   Immunization History  Administered Date(s) Administered  . Influenza Whole 01/03/2007  . Influenza,inj,Quad PF,36+ Mos 02/06/2013, 01/20/2014, 01/15/2015, 01/29/2016  . Pneumococcal Conjugate-13 01/20/2014  . Pneumococcal Polysaccharide-23 01/02/2001  . Td 04/04/1994  . Tdap 01/25/2013   Patient Active Problem List   Diagnosis Date Noted  . CAD (coronary artery disease)     Priority: High  . Adenocarcinoma of left lung, stage 1 ([place]) 07/25/2011    Priority: High  . Cancer of prostate w/med recur risk (T2b-c or Gleason 7 or PSA 10-20) ([place]) 07/25/2011    Priority: High  . Type 2 diabetes mellitus with vascular disease ([place]) 05/21/2007    Priority: High  . Pericardial tamponade 09/17/2012    Priority: Medium  .  Acute pericarditis, unspecified 09/13/2012    Priority: Medium  . HYPERCHOLESTEROLEMIA 05/21/2007    Priority: Medium  . Essential hypertension 01/08/2007    Priority: Medium  . Type 2 DM with CKD stage 3 and hypertension ([place]) 07/16/2015  . Macular degeneration of both eyes 01/21/2014  . 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 Nonmelanoma skin cancer 07/25/2011  . History of colonic polyps 07/25/2011  . VERTIGO 07/22/2008  . CARCINOMA, SKIN, SQUAMOUS CELL 01/08/2007  . Generalized anxiety disorder 01/08/2007  . ALZHEIMER'S DISEASE, MILD 01/08/2007   Past Medical History:  Diagnosis Date  . Adenocarcinoma of left lung, stage 1 ([place]) 2001   T1N0 stage I  adenoca left lung resected 01/03/00   . Alzheimer's disease   . CAD (coronary artery disease) 2014   a. 09/2012 Cath: LM nl, LAD 50-60p, D1 60-70 m, D1 50ost, LCX nl, RCA min irregs, EF 55-65%.  . Generalized anxiety disorder 01/08/2007   Qualifier: Diagnosis of  By: Diona Browner MD, [name]    . History of colonic polyps 07/25/2011  . Macular degeneration of both eyes 01/21/2014  . Nonmelanoma skin cancer 07/25/2011   Multiple lesions excised face/nose  . Pericarditis    a. 09/2012 with effusion and tamponade, s/p window.  b.  F/u Echo 09/19/12: mod LVH, EF 55%, Gr 1 DD, Tr MR, mild RVE, no residual effusion  . Prostate CA ([place]) 04/2001   Gleason 7  S/P prostatectomy 04/11/01  . Squamous cell carcinoma of skin   . 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  Type 2 diabetes mellitus with vascular disease ([place]) 05/21/2007   Qualifier: Diagnosis of  By: Diona Browner MD, [name]    . Type 2 DM with CKD stage 3 and hypertension ([place]) 07/16/2015  . Unspecified essential hypertension    Past Surgical History:  Procedure Laterality Date  . HERNIA REPAIR    . LEFT HEART CATHETERIZATION WITH CORONARY ANGIOGRAM N/A 09/13/2012   Procedure: LEFT HEART CATHETERIZATION WITH CORONARY ANGIOGRAM;  Surgeon: Sherren Mocha, MD;  [facility];  Service:  Cardiovascular;  Laterality: N/A;  . LOBECTOMY  2001   upper left  . PERICARDIAL TAP N/A 09/16/2012   Procedure: PERICARDIAL TAP;  Surgeon: Sinclair Grooms, MD;  Location: [facility];  Service: Cardiovascular;  Laterality: N/A;  . PROSTATECTOMY  2003  . SUBXYPHOID PERICARDIAL WINDOW N/A 09/16/2012   Procedure: SUBXYPHOID PERICARDIAL WINDOW;  Surgeon: Rexene Alberts, MD;  Location: [facility];  Service: Thoracic;  Laterality: N/A;  . TONSILLECTOMY     Social History   Social History  . Marital status: Married    Spouse name: N/A  . Number of children: N/A  . Years of education: N/A   Occupational History  . retired    Social History Main Topics  . Smoking status: Former Smoker    Packs/day: 2.00    Years: 42.00    Types: Cigarettes    Start date: 02/25/1953    Quit date: 04/04/1984  . Smokeless tobacco: Former Systems developer    Types: [place] date: 07/27/1986     Comment: quit tobacco 26 years ago  . Alcohol use No  . Drug use: No  . Sexual activity: Not on file   Other Topics Concern  . Not on file   Social History Narrative   Lives in [place], [place]   Regular exercise--no, mowing grass   Diet: fruit and veggies   Family History  Problem Relation Age of Onset  . Cancer Father        colon  . Dementia Mother   . Diabetes Mother   . Cancer Brother        lung   Allergies  Allergen Reactions  . Sulfa Antibiotics Nausea Only    Medication list has been reviewed and updated.   General: Denies fever, chills, sweats. No significant weight loss. Eyes: Denies blurring,significant itching ENT: Denies earache, sore throat, and hoarseness. Cardiovascular: Denies chest pains, palpitations, dyspnea on exertion Respiratory: Denies cough, dyspnea at rest,wheeezing Breast: no concerns about lumps GI: nausea only GU: Denies penile discharge, ED, urinary flow / outflow problems. No STD concerns. Musculoskeletal: Denies back pain, joint pain Derm: Denies rash, itching Neuro:  Denies  paresthesias, frequent falls, frequent headaches Psych: Denies depression, anxiety Endocrine: Denies cold intolerance, heat intolerance, polydipsia Heme: Denies enlarged lymph nodes Allergy: No hayfever  Objective:   BP 130/80   Pulse 67   Temp 97.3 F (36.3 C) (Oral)   Ht 5' 8.25" (1.734 m)   Wt 188 lb (85.3 kg)   BMI 28.38 kg/m  Ideal Body Weight: Weight in (lb) to have BMI = 25: 165.3  No exam data present  GEN: well developed, well nourished, no acute distress Eyes: conjunctiva and lids normal, PERRLA, EOMI ENT: TM clear, nares clear, oral exam WNL Neck: supple, no lymphadenopathy, no thyromegaly, no JVD Pulm: clear to auscultation and percussion, respiratory effort normal CV: regular rate and rhythm, S1-S2, no murmur, rub or gallop, no bruits, peripheral pulses normal and symmetric, no cyanosis,  clubbing, edema or varicosities GI: soft, non-tender; no hepatosplenomegaly, masses; active bowel sounds all quadrants GU: no hernia, testicular mass, penile discharge Lymph: no cervical, axillary or inguinal adenopathy MSK: gait normal, muscle tone and strength WNL, no joint swelling, effusions, discoloration, crepitus  SKIN: R nose, area of ulceration and bleeding Neuro: normal mental status, normal strength, sensation, and motion Psych: alert; oriented to person, place and time, normally interactive and not anxious or depressed in appearance. All labs reviewed with patient.  Lipids:    Component Value Date/Time   CHOL 112 08/15/2016 1505   TRIG 219.0 (H) 08/15/2016 1505   HDL 34.10 (L) 08/15/2016 1505   LDLDIRECT 56.0 08/15/2016 1505   VLDL 43.8 (H) 08/15/2016 1505   CHOLHDL 3 08/15/2016 1505   CBC: CBC Latest Ref Rng & Units 08/15/2016 07/30/2015 01/15/2015  WBC 4.0 - 10.5 K/uL 8.9 6.3 8.9  Hemoglobin 13.0 - 17.0 g/dL 14.1 13.7 14.1  Hematocrit 39.0 - 52.0 % 42.0 41.2 42.0  Platelets 150.0 - 400.0 K/uL 168.0 137(L) 876.8    Basic Metabolic Panel:      Component Value Date/Time   NA 139 08/15/2016 1505   NA 143 07/30/2015 0957   K 5.2 (H) 08/15/2016 1505   K 5.3 No visable hemolysis (H) 07/30/2015 0957   CL 103 08/15/2016 1505   CL 103 07/20/2012 1046   CO2 30 08/15/2016 1505   CO2 28 07/30/2015 0957   BUN 19 08/15/2016 1505   BUN 14.4 07/30/2015 0957   CREATININE 1.28 08/15/2016 1505   CREATININE 1.5 (H) 07/30/2015 0957   GLUCOSE 148 (H) 08/15/2016 1505   GLUCOSE 231 (H) 07/30/2015 0957   GLUCOSE 215 (H) 07/20/2012 1046   CALCIUM 9.7 08/15/2016 1505   CALCIUM 9.5 07/30/2015 0957   Hepatic Function Latest Ref Rng & Units 08/15/2016 07/30/2015 01/15/2015  Total Protein 6.0 - 8.3 g/dL 7.2 6.8 6.9  Albumin 3.5 - 5.2 g/dL 4.6 3.9 4.1  AST 0 - 37 U/L _0 ALT 0 - 53 U/L _1 Alk Phosphatase 39 - 117 U/L 82 88 83  Total Bilirubin 0.2 - 1.2 mg/dL 0.6 0.43 0.4  Bilirubin, Direct 0.0 - 0.3 mg/dL - - 0.1    Lab Results  Component Value Date   TSH 1.110 04/19/2011   Lab Results  Component Value Date   PSA <0.01 07/31/2014   PSA 0.02 (L) 07/09/2014   PSA <0.01 07/16/2013    Assessment and Plan:   Non-healing skin lesion of nose - Plan: Ambulatory referral to Dermatology  Encounter for general adult medical examination with abnormal findings  Health Maintenance Exam: The patient's preventative maintenance and recommended screening tests for an annual wellness exam were reviewed in full today. Brought up to date unless services declined.  Counselled on the importance of diet, exercise, and its role in overall health and mortality. The patient's FH and SH was reviewed, including their home life, tobacco status, and drug and alcohol status.  Follow-up in 1 year for physical exam or additional follow-up below.  Follow-up: Return in about 6 months (around 03/07/2017). Or follow-up in 1 year if not noted.  Orders Placed This Encounter  Procedures  . Ambulatory referral to Dermatology    Signed,  Frederico Hamman T.  Xee Hollman, MD   Allergies as of 09/05/2016      Reactions   Sulfa Antibiotics Nausea Only      Medication List       Accurate as of 09/05/16 12:51  PM. Always use your most recent med list.          aspirin EC 81 MG tablet Take 81 mg by mouth daily.   atorvastatin 40 MG tablet Commonly known as:  LIPITOR TAKE 1 TABLET BY MOUTH DAILY   donepezil 10 MG tablet Commonly known as:  ARICEPT Take 1 tablet (10 mg total) by mouth at bedtime.   Fish Oil 1000 MG Caps Take 1,000 mg by mouth at bedtime.   glipiZIDE 5 MG tablet Commonly known as:  GLUCOTROL Take 1 tablet (5 mg total) by mouth daily before breakfast.   ICAPS LUTEIN & ZEAXANTHIN PO Take 1 capsule by mouth 2 (two) times daily.   metFORMIN 500 MG 24 hr tablet Commonly known as:  GLUCOPHAGE-XR TAKE 1 TABLET BY MOUTH TWICE A DAY   sertraline 50 MG tablet Commonly known as:  ZOLOFT Take 1 tablet (50 mg total) by mouth daily.   triamcinolone cream 0.1 % Commonly known as:  KENALOG Apply 1 application topically 2 (two) times daily.

## 2016-09-05 NOTE — Patient Instructions (Addendum)
REFERRALS TO SPECIALISTS, SPECIAL TESTS (MRI, CT, ULTRASOUNDS)  MARION or LINDA will help you. ASK CHECK-IN FOR HELP.  Imaging / Special Testing referrals sometimes can be done same day if EMERGENCY, but others can take 2 or 3 days to get an appointment. Starting in 2015, many of the new Medicare plans and Obamacare plans take much longer.   Specialist appointment times vary a great deal, based on their schedule / openings. -- Some specialists have very long wait times. (Example. Dermatology. Multiple months  for non-cancer)      Health Maintenance, Male A healthy lifestyle and preventive care is important for your health and wellness. Ask your health care provider about what schedule of regular examinations is right for you. What should I know about weight and diet? Eat a Healthy Diet  Eat plenty of vegetables, fruits, whole grains, low-fat dairy products, and lean protein.  Do not eat a lot of foods high in solid fats, added sugars, or salt.  Maintain a Healthy Weight Regular exercise can help you achieve or maintain a healthy weight. You should:  Do at least 150 minutes of exercise each week. The exercise should increase your heart rate and make you sweat (moderate-intensity exercise).  Do strength-training exercises at least twice a week.  Watch Your Levels of Cholesterol and Blood Lipids  Have your blood tested for lipids and cholesterol every 5 years starting at 81 years of age. If you are at high risk for heart disease, you should start having your blood tested when you are 81 years old. You may need to have your cholesterol levels checked more often if: ? Your lipid or cholesterol levels are high. ? You are older than 81 years of age. ? You are at high risk for heart disease.  What should I know about cancer screening? Many types of cancers can be detected early and may often be prevented. Lung Cancer  You should be screened every year for lung cancer if: ? You are a  current smoker who has smoked for at least 30 years. ? You are a former smoker who has quit within the past 15 years.  Talk to your health care provider about your screening options, when you should start screening, and how often you should be screened.  Colorectal Cancer  Routine colorectal cancer screening usually begins at 81 years of age and should be repeated every 5-10 years until you are 80 years old. You may need to be screened more often if early forms of precancerous polyps or small growths are found. Your health care provider may recommend screening at an earlier age if you have risk factors for colon cancer.  Your health care provider may recommend using home test kits to check for hidden blood in the stool.  A small camera at the end of a tube can be used to examine your colon (sigmoidoscopy or colonoscopy). This checks for the earliest forms of colorectal cancer.  Prostate and Testicular Cancer  Depending on your age and overall health, your health care provider may do certain tests to screen for prostate and testicular cancer.  Talk to your health care provider about any symptoms or concerns you have about testicular or prostate cancer.  Skin Cancer  Check your skin from head to toe regularly.  Tell your health care provider about any new moles or changes in moles, especially if: ? There is a change in a mole's size, shape, or color. ? You have a mole that is larger  than a pencil eraser.  Always use sunscreen. Apply sunscreen liberally and repeat throughout the day.  Protect yourself by wearing long sleeves, pants, a wide-brimmed hat, and sunglasses when outside.  What should I know about heart disease, diabetes, and high blood pressure?  If you are 52-60 years of age, have your blood pressure checked every 3-5 years. If you are 82 years of age or older, have your blood pressure checked every year. You should have your blood pressure measured twice-once when you are at  a hospital or clinic, and once when you are not at a hospital or clinic. Record the average of the two measurements. To check your blood pressure when you are not at a hospital or clinic, you can use: ? An automated blood pressure machine at a pharmacy. ? A home blood pressure monitor.  Talk to your health care provider about your target blood pressure.  If you are between 23-26 years old, ask your health care provider if you should take aspirin to prevent heart disease.  Have regular diabetes screenings by checking your fasting blood sugar level. ? If you are at a normal weight and have a low risk for diabetes, have this test once every three years after the age of 16. ? If you are overweight and have a high risk for diabetes, consider being tested at a younger age or more often.  A one-time screening for abdominal aortic aneurysm (AAA) by ultrasound is recommended for men aged 71-75 years who are current or former smokers. What should I know about preventing infection? Hepatitis B If you have a higher risk for hepatitis B, you should be screened for this virus. Talk with your health care provider to find out if you are at risk for hepatitis B infection. Hepatitis C Blood testing is recommended for:  Everyone born from [birth year] through 1963.  Anyone with known risk factors for hepatitis C.  Sexually Transmitted Diseases (STDs)  You should be screened each year for STDs including gonorrhea and chlamydia if: ? You are sexually active and are younger than 81 years of age. ? You are older than 81 years of age and your health care provider tells you that you are at risk for this type of infection. ? Your sexual activity has changed since you were last screened and you are at an increased risk for chlamydia or gonorrhea. Ask your health care provider if you are at risk.  Talk with your health care provider about whether you are at high risk of being infected with HIV. Your health care provider  may recommend a prescription medicine to help prevent HIV infection.  What else can I do?  Schedule regular health, dental, and eye exams.  Stay current with your vaccines (immunizations).  Do not use any tobacco products, such as cigarettes, chewing tobacco, and e-cigarettes. If you need help quitting, ask your health care provider.  Limit alcohol intake to no more than 2 drinks per day. One drink equals 12 ounces of beer, 5 ounces of wine, or 1 ounces of hard liquor.  Do not use street drugs.  Do not share needles.  Ask your health care provider for help if you need support or information about quitting drugs.  Tell your health care provider if you often feel depressed.  Tell your health care provider if you have ever been abused or do not feel safe at home. This information is not intended to replace advice given to you by your health care provider.  Make sure you discuss any questions you have with your health care provider. Document Released: 09/17/2007 Document Revised: 11/18/2015 Document Reviewed: 12/23/2014 Elsevier Interactive Patient Education  [employer].

## 2016-09-07 ENCOUNTER — Encounter (INDEPENDENT_AMBULATORY_CARE_PROVIDER_SITE_OTHER): Payer: Medicare HMO | Admitting: Ophthalmology

## 2016-09-07 DIAGNOSIS — H353231 Exudative age-related macular degeneration, bilateral, with active choroidal neovascularization: Secondary | ICD-10-CM | POA: Diagnosis not present

## 2016-09-07 DIAGNOSIS — E113293 Type 2 diabetes mellitus with mild nonproliferative diabetic retinopathy without macular edema, bilateral: Secondary | ICD-10-CM

## 2016-09-07 DIAGNOSIS — H43813 Vitreous degeneration, bilateral: Secondary | ICD-10-CM | POA: Diagnosis not present

## 2016-09-07 DIAGNOSIS — E11311 Type 2 diabetes mellitus with unspecified diabetic retinopathy with macular edema: Secondary | ICD-10-CM

## 2016-09-13 ENCOUNTER — Other Ambulatory Visit: Payer: Self-pay | Admitting: Dermatology

## 2016-09-13 DIAGNOSIS — L821 Other seborrheic keratosis: Secondary | ICD-10-CM | POA: Diagnosis not present

## 2016-09-13 DIAGNOSIS — D492 Neoplasm of unspecified behavior of bone, soft tissue, and skin: Secondary | ICD-10-CM | POA: Diagnosis not present

## 2016-09-13 DIAGNOSIS — D0439 Carcinoma in situ of skin of other parts of face: Secondary | ICD-10-CM | POA: Diagnosis not present

## 2016-09-13 DIAGNOSIS — L570 Actinic keratosis: Secondary | ICD-10-CM | POA: Diagnosis not present

## 2016-09-13 DIAGNOSIS — C44321 Squamous cell carcinoma of skin of nose: Secondary | ICD-10-CM | POA: Diagnosis not present

## 2016-10-11 ENCOUNTER — Other Ambulatory Visit: Payer: Self-pay | Admitting: *Deleted

## 2016-10-11 MED ORDER — DONEPEZIL HCL 10 MG PO TABS: 10.0000 mg | ORAL_TABLET | Freq: Every day | ORAL | 3 refills | Status: DC

## 2016-10-12 ENCOUNTER — Encounter (INDEPENDENT_AMBULATORY_CARE_PROVIDER_SITE_OTHER): Payer: Medicare HMO | Admitting: Ophthalmology

## 2016-10-12 DIAGNOSIS — E113293 Type 2 diabetes mellitus with mild nonproliferative diabetic retinopathy without macular edema, bilateral: Secondary | ICD-10-CM

## 2016-10-12 DIAGNOSIS — H43813 Vitreous degeneration, bilateral: Secondary | ICD-10-CM

## 2016-10-12 DIAGNOSIS — H353231 Exudative age-related macular degeneration, bilateral, with active choroidal neovascularization: Secondary | ICD-10-CM | POA: Diagnosis not present

## 2016-10-12 DIAGNOSIS — E11319 Type 2 diabetes mellitus with unspecified diabetic retinopathy without macular edema: Secondary | ICD-10-CM

## 2016-10-17 DIAGNOSIS — H40013 Open angle with borderline findings, low risk, bilateral: Secondary | ICD-10-CM | POA: Diagnosis not present

## 2016-10-17 DIAGNOSIS — H353231 Exudative age-related macular degeneration, bilateral, with active choroidal neovascularization: Secondary | ICD-10-CM | POA: Diagnosis not present

## 2016-10-24 DIAGNOSIS — C44321 Squamous cell carcinoma of skin of nose: Secondary | ICD-10-CM | POA: Diagnosis not present

## 2016-10-24 DIAGNOSIS — D485 Neoplasm of uncertain behavior of skin: Secondary | ICD-10-CM | POA: Diagnosis not present

## 2016-10-24 DIAGNOSIS — D040 Carcinoma in situ of skin of lip: Secondary | ICD-10-CM | POA: Diagnosis not present

## 2016-11-03 DIAGNOSIS — D0411 Carcinoma in situ of skin of right eyelid, including canthus: Secondary | ICD-10-CM | POA: Diagnosis not present

## 2016-11-28 ENCOUNTER — Other Ambulatory Visit: Payer: Self-pay | Admitting: Family Medicine

## 2016-12-03 DIAGNOSIS — E119 Type 2 diabetes mellitus without complications: Secondary | ICD-10-CM | POA: Diagnosis not present

## 2016-12-07 ENCOUNTER — Encounter (INDEPENDENT_AMBULATORY_CARE_PROVIDER_SITE_OTHER): Payer: Medicare HMO | Admitting: Ophthalmology

## 2016-12-07 DIAGNOSIS — E11311 Type 2 diabetes mellitus with unspecified diabetic retinopathy with macular edema: Secondary | ICD-10-CM | POA: Diagnosis not present

## 2016-12-07 DIAGNOSIS — E113293 Type 2 diabetes mellitus with mild nonproliferative diabetic retinopathy without macular edema, bilateral: Secondary | ICD-10-CM

## 2016-12-07 DIAGNOSIS — H43813 Vitreous degeneration, bilateral: Secondary | ICD-10-CM | POA: Diagnosis not present

## 2016-12-07 DIAGNOSIS — H353231 Exudative age-related macular degeneration, bilateral, with active choroidal neovascularization: Secondary | ICD-10-CM

## 2017-02-01 ENCOUNTER — Encounter (INDEPENDENT_AMBULATORY_CARE_PROVIDER_SITE_OTHER): Payer: Medicare HMO | Admitting: Ophthalmology

## 2017-02-01 DIAGNOSIS — H43813 Vitreous degeneration, bilateral: Secondary | ICD-10-CM

## 2017-02-01 DIAGNOSIS — E11319 Type 2 diabetes mellitus with unspecified diabetic retinopathy without macular edema: Secondary | ICD-10-CM

## 2017-02-01 DIAGNOSIS — H353231 Exudative age-related macular degeneration, bilateral, with active choroidal neovascularization: Secondary | ICD-10-CM

## 2017-02-01 DIAGNOSIS — E113293 Type 2 diabetes mellitus with mild nonproliferative diabetic retinopathy without macular edema, bilateral: Secondary | ICD-10-CM | POA: Diagnosis not present

## 2017-03-02 ENCOUNTER — Other Ambulatory Visit: Payer: Self-pay | Admitting: Family Medicine

## 2017-03-03 ENCOUNTER — Other Ambulatory Visit: Payer: Self-pay | Admitting: Family Medicine

## 2017-03-04 DIAGNOSIS — E119 Type 2 diabetes mellitus without complications: Secondary | ICD-10-CM | POA: Diagnosis not present

## 2017-04-05 ENCOUNTER — Encounter (INDEPENDENT_AMBULATORY_CARE_PROVIDER_SITE_OTHER): Payer: Medicare HMO | Admitting: Ophthalmology

## 2017-04-05 DIAGNOSIS — H43813 Vitreous degeneration, bilateral: Secondary | ICD-10-CM | POA: Diagnosis not present

## 2017-04-05 DIAGNOSIS — H353231 Exudative age-related macular degeneration, bilateral, with active choroidal neovascularization: Secondary | ICD-10-CM

## 2017-04-05 DIAGNOSIS — E11311 Type 2 diabetes mellitus with unspecified diabetic retinopathy with macular edema: Secondary | ICD-10-CM | POA: Diagnosis not present

## 2017-04-05 DIAGNOSIS — E113211 Type 2 diabetes mellitus with mild nonproliferative diabetic retinopathy with macular edema, right eye: Secondary | ICD-10-CM

## 2017-04-05 DIAGNOSIS — E113292 Type 2 diabetes mellitus with mild nonproliferative diabetic retinopathy without macular edema, left eye: Secondary | ICD-10-CM | POA: Diagnosis not present

## 2017-04-05 LAB — HM DIABETES EYE EXAM

## 2017-04-07 ENCOUNTER — Encounter: Payer: Self-pay | Admitting: Family Medicine

## 2017-05-15 DIAGNOSIS — H903 Sensorineural hearing loss, bilateral: Secondary | ICD-10-CM | POA: Diagnosis not present

## 2017-05-15 DIAGNOSIS — H9313 Tinnitus, bilateral: Secondary | ICD-10-CM | POA: Diagnosis not present

## 2017-05-29 DIAGNOSIS — H35033 Hypertensive retinopathy, bilateral: Secondary | ICD-10-CM | POA: Diagnosis not present

## 2017-05-29 DIAGNOSIS — H353221 Exudative age-related macular degeneration, left eye, with active choroidal neovascularization: Secondary | ICD-10-CM | POA: Diagnosis not present

## 2017-05-29 DIAGNOSIS — H26493 Other secondary cataract, bilateral: Secondary | ICD-10-CM | POA: Diagnosis not present

## 2017-05-29 DIAGNOSIS — H353211 Exudative age-related macular degeneration, right eye, with active choroidal neovascularization: Secondary | ICD-10-CM | POA: Diagnosis not present

## 2017-05-29 DIAGNOSIS — E113293 Type 2 diabetes mellitus with mild nonproliferative diabetic retinopathy without macular edema, bilateral: Secondary | ICD-10-CM | POA: Diagnosis not present

## 2017-05-29 DIAGNOSIS — H26491 Other secondary cataract, right eye: Secondary | ICD-10-CM | POA: Diagnosis not present

## 2017-05-29 DIAGNOSIS — H40013 Open angle with borderline findings, low risk, bilateral: Secondary | ICD-10-CM | POA: Diagnosis not present

## 2017-05-30 ENCOUNTER — Other Ambulatory Visit: Payer: Self-pay | Admitting: Dermatology

## 2017-05-30 DIAGNOSIS — L570 Actinic keratosis: Secondary | ICD-10-CM | POA: Diagnosis not present

## 2017-05-30 DIAGNOSIS — L409 Psoriasis, unspecified: Secondary | ICD-10-CM | POA: Diagnosis not present

## 2017-05-30 DIAGNOSIS — D485 Neoplasm of uncertain behavior of skin: Secondary | ICD-10-CM | POA: Diagnosis not present

## 2017-05-30 DIAGNOSIS — C441291 Squamous cell carcinoma of skin of left upper eyelid, including canthus: Secondary | ICD-10-CM | POA: Diagnosis not present

## 2017-05-30 DIAGNOSIS — C44329 Squamous cell carcinoma of skin of other parts of face: Secondary | ICD-10-CM | POA: Diagnosis not present

## 2017-06-03 DIAGNOSIS — E119 Type 2 diabetes mellitus without complications: Secondary | ICD-10-CM | POA: Diagnosis not present

## 2017-06-07 ENCOUNTER — Encounter (INDEPENDENT_AMBULATORY_CARE_PROVIDER_SITE_OTHER): Payer: Medicare HMO | Admitting: Ophthalmology

## 2017-06-07 DIAGNOSIS — H43813 Vitreous degeneration, bilateral: Secondary | ICD-10-CM | POA: Diagnosis not present

## 2017-06-07 DIAGNOSIS — H353231 Exudative age-related macular degeneration, bilateral, with active choroidal neovascularization: Secondary | ICD-10-CM

## 2017-06-07 DIAGNOSIS — E113313 Type 2 diabetes mellitus with moderate nonproliferative diabetic retinopathy with macular edema, bilateral: Secondary | ICD-10-CM | POA: Diagnosis not present

## 2017-06-07 DIAGNOSIS — E11311 Type 2 diabetes mellitus with unspecified diabetic retinopathy with macular edema: Secondary | ICD-10-CM | POA: Diagnosis not present

## 2017-06-12 DIAGNOSIS — H26492 Other secondary cataract, left eye: Secondary | ICD-10-CM | POA: Diagnosis not present

## 2017-06-26 DIAGNOSIS — C44329 Squamous cell carcinoma of skin of other parts of face: Secondary | ICD-10-CM | POA: Diagnosis not present

## 2017-07-04 ENCOUNTER — Other Ambulatory Visit: Payer: Self-pay | Admitting: Family Medicine

## 2017-07-27 DIAGNOSIS — C44329 Squamous cell carcinoma of skin of other parts of face: Secondary | ICD-10-CM | POA: Diagnosis not present

## 2017-08-09 ENCOUNTER — Encounter (INDEPENDENT_AMBULATORY_CARE_PROVIDER_SITE_OTHER): Payer: Medicare HMO | Admitting: Ophthalmology

## 2017-08-09 DIAGNOSIS — I10 Essential (primary) hypertension: Secondary | ICD-10-CM

## 2017-08-09 DIAGNOSIS — H35033 Hypertensive retinopathy, bilateral: Secondary | ICD-10-CM | POA: Diagnosis not present

## 2017-08-09 DIAGNOSIS — H43813 Vitreous degeneration, bilateral: Secondary | ICD-10-CM | POA: Diagnosis not present

## 2017-08-09 DIAGNOSIS — H353231 Exudative age-related macular degeneration, bilateral, with active choroidal neovascularization: Secondary | ICD-10-CM | POA: Diagnosis not present

## 2017-08-30 ENCOUNTER — Ambulatory Visit (INDEPENDENT_AMBULATORY_CARE_PROVIDER_SITE_OTHER): Payer: Medicare HMO

## 2017-08-30 VITALS — BP 134/72 | HR 76 | Temp 97.5°F | Ht 68.5 in | Wt 186.5 lb

## 2017-08-30 DIAGNOSIS — E1159 Type 2 diabetes mellitus with other circulatory complications: Secondary | ICD-10-CM

## 2017-08-30 DIAGNOSIS — E7800 Pure hypercholesterolemia, unspecified: Secondary | ICD-10-CM

## 2017-08-30 DIAGNOSIS — Z0000 Encounter for general adult medical examination without abnormal findings: Secondary | ICD-10-CM

## 2017-08-30 DIAGNOSIS — I10 Essential (primary) hypertension: Secondary | ICD-10-CM | POA: Diagnosis not present

## 2017-08-30 LAB — CBC WITH DIFFERENTIAL/PLATELET
Basophils Absolute: 0 10*3/uL (ref 0.0–0.1)
Basophils Relative: 0.3 % (ref 0.0–3.0)
Eosinophils Absolute: 0.1 10*3/uL (ref 0.0–0.7)
Eosinophils Relative: 1.6 % (ref 0.0–5.0)
HCT: 40.1 % (ref 39.0–52.0)
Hemoglobin: 13.6 g/dL (ref 13.0–17.0)
Lymphocytes Relative: 10.7 % — ABNORMAL LOW (ref 12.0–46.0)
Lymphs Abs: 0.8 10*3/uL (ref 0.7–4.0)
MCHC: 33.9 g/dL (ref 30.0–36.0)
MCV: 98.2 fl (ref 78.0–100.0)
Monocytes Absolute: 1 10*3/uL (ref 0.1–1.0)
Monocytes Relative: 13.6 % — ABNORMAL HIGH (ref 3.0–12.0)
Neutro Abs: 5.6 10*3/uL (ref 1.4–7.7)
Neutrophils Relative %: 73.8 % (ref 43.0–77.0)
Platelets: 152 10*3/uL (ref 150.0–400.0)
RBC: 4.08 Mil/uL — ABNORMAL LOW (ref 4.22–5.81)
RDW: 13.3 % (ref 11.5–15.5)
WBC: 7.5 10*3/uL (ref 4.0–10.5)

## 2017-08-30 LAB — LIPID PANEL
Cholesterol: 104 mg/dL (ref 0–200)
HDL: 31.8 mg/dL — ABNORMAL LOW (ref >39.00)
LDL Cholesterol: 37 mg/dL (ref 0–99)
NonHDL: 72.28
Total CHOL/HDL Ratio: 3
Triglycerides: 176 mg/dL — ABNORMAL HIGH (ref 0.0–149.0)
VLDL: 35.2 mg/dL (ref 0.0–40.0)

## 2017-08-30 LAB — HEPATIC FUNCTION PANEL
ALT: 13 U/L (ref 0–53)
AST: 11 U/L (ref 0–37)
Albumin: 4.1 g/dL (ref 3.5–5.2)
Alkaline Phosphatase: 64 U/L (ref 39–117)
Bilirubin, Direct: 0.1 mg/dL (ref 0.0–0.3)
Total Bilirubin: 0.5 mg/dL (ref 0.2–1.2)
Total Protein: 6.8 g/dL (ref 6.0–8.3)

## 2017-08-30 LAB — BASIC METABOLIC PANEL
BUN: 18 mg/dL (ref 6–23)
CO2: 31 mEq/L (ref 19–32)
Calcium: 9.1 mg/dL (ref 8.4–10.5)
Chloride: 103 mEq/L (ref 96–112)
Creatinine, Ser: 1.28 mg/dL (ref 0.40–1.50)
GFR: 56.79 mL/min — ABNORMAL LOW (ref >60.00)
Glucose, Bld: 188 mg/dL — ABNORMAL HIGH (ref 70–99)
Potassium: 5.4 mEq/L — ABNORMAL HIGH (ref 3.5–5.1)
Sodium: 138 mEq/L (ref 135–145)

## 2017-08-30 LAB — MICROALBUMIN / CREATININE URINE RATIO
Creatinine,U: 201 mg/dL
Microalb Creat Ratio: 1.8 mg/g (ref 0.0–30.0)
Microalb, Ur: 3.6 mg/dL — ABNORMAL HIGH (ref 0.0–1.9)

## 2017-08-30 LAB — HEMOGLOBIN A1C: Hgb A1c MFr Bld: 6.8 % — ABNORMAL HIGH (ref 4.6–6.5)

## 2017-08-30 LAB — LDL CHOLESTEROL, DIRECT: Direct LDL: 54 mg/dL

## 2017-08-30 NOTE — Progress Notes (Signed)
Subjective:   [name] is a 84 y.o. male who presents for Medicare Annual/Subsequent preventive examination.  Review of Systems:  N/A Cardiac Risk Factors include: advanced age (>38men, >41 women);male gender;diabetes mellitus;dyslipidemia;hypertension     Objective:    Vitals: BP 134/72 (BP Location: Right Arm, Patient Position: Sitting, Cuff Size: Normal)   Pulse 76   Temp (!) 97.5 F (36.4 C) (Oral)   Ht 5' 8.5" (1.74 m) Comment: no shoes  Wt 186 lb 8 oz (84.6 kg)   SpO2 96%   BMI 27.94 kg/m   Body mass index is 27.94 kg/m.  Advanced Directives 08/30/2017 08/15/2016 07/30/2015 07/31/2014 09/13/2012  Does Patient Have a Medical Advance Directive? Yes Yes Yes Yes Patient has advance directive, copy not in chart  Type of Advance Directive [place];Living will [place];Living will [place];Living will - Living will  Does patient want to make changes to medical advance directive? - - No - Patient declined - -  Copy of [place] in Chart? No - copy requested No - copy requested No - copy requested No - copy requested Copy requested from family  Pre-existing out of facility DNR order (yellow form or pink MOST form) - - - - No    Tobacco Social History   Tobacco Use  Smoking Status Former Smoker  . Packs/day: 2.00  . Years: 42.00  . Pack years: 84.00  . Types: Cigarettes  . Start date: 02/25/1953  . Last attempt to quit: 04/04/1984  . Years since quitting: 33.4  Smokeless Tobacco Former Systems developer  . Types: Chew  . Quit date: 07/27/1986  Tobacco Comment   quit tobacco 26 years ago     Counseling given: No Comment: quit tobacco 26 years ago   Clinical Intake:  Pre-visit preparation completed: Yes  Pain : No/denies pain Pain Score: 0-No pain     Nutritional Status: BMI 25 -29 Overweight Nutritional Risks: None Diabetes: Yes CBG done?: No Did pt. bring in CBG monitor from home?:  No  How often do you need to have someone help you when you read instructions, pamphlets, or other written materials from your doctor or pharmacy?: 1 - Never What is the last grade level you completed in school?: 6th grade  Interpreter Needed?: No  Comments: pt lives with spouse Information entered by :: LPinson, LPN  Past Medical History:  Diagnosis Date  . Adenocarcinoma of left lung, stage 1 ([place]) 2001   T1N0 stage I  adenoca left lung resected 01/03/00   . Alzheimer's disease   . CAD (coronary artery disease) 2014   a. 09/2012 Cath: LM nl, LAD 50-60p, D1 60-70 m, D1 50ost, LCX nl, RCA min irregs, EF 55-65%.  . Generalized anxiety disorder 01/08/2007   Qualifier: Diagnosis of  By: Diona Browner MD, [name]    . History of colonic polyps 07/25/2011  . Macular degeneration of both eyes 01/21/2014  . Nonmelanoma skin cancer 07/25/2011   Multiple lesions excised face/nose  . Pericarditis    a. 09/2012 with effusion and tamponade, s/p window.  b.  F/u Echo 09/19/12: mod LVH, EF 55%, Gr 1 DD, Tr MR, mild RVE, no residual effusion  . Prostate CA ([place]) 04/2001   Gleason 7  S/P prostatectomy 04/11/01  . Squamous cell carcinoma of skin   . 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 Type 2 diabetes mellitus with vascular disease ([place]) 05/21/2007   Qualifier: Diagnosis of  By: Diona Browner  MD, [name]    . Type 2 DM with CKD stage 3 and hypertension ([place]) 07/16/2015  . Unspecified essential hypertension    Past Surgical History:  Procedure Laterality Date  . HERNIA REPAIR    . LEFT HEART CATHETERIZATION WITH CORONARY ANGIOGRAM N/A 09/13/2012   Procedure: LEFT HEART CATHETERIZATION WITH CORONARY ANGIOGRAM;  Surgeon: Sherren Mocha, MD;  Location: [facility] CATH LAB;  Service: Cardiovascular;  Laterality: N/A;  . LOBECTOMY  2001   upper left  . PERICARDIAL TAP N/A 09/16/2012   Procedure: PERICARDIAL TAP;  Surgeon: Sinclair Grooms, MD;  Location: [facility] CATH LAB;  Service: Cardiovascular;  Laterality: N/A;  . PROSTATECTOMY  2003   . SUBXYPHOID PERICARDIAL WINDOW N/A 09/16/2012   Procedure: SUBXYPHOID PERICARDIAL WINDOW;  Surgeon: Rexene Alberts, MD;  Location: [facility] OR;  Service: Thoracic;  Laterality: N/A;  . TONSILLECTOMY     Family History  Problem Relation Age of Onset  . Cancer Father        colon  . Dementia Mother   . Diabetes Mother   . Cancer Brother        lung   Social History   Socioeconomic History  . Marital status: Married    Spouse name: Not on file  . Number of children: Not on file  . Years of education: Not on file  . Highest education level: Not on file  Occupational History  . Occupation: retired  Scientific laboratory technician  . Financial resource strain: Not on file  . Food insecurity:    Worry: Not on file    Inability: Not on file  . Transportation needs:    Medical: Not on file    Non-medical: Not on file  Tobacco Use  . Smoking status: Former Smoker    Packs/day: 2.00    Years: 42.00    Pack years: 84.00    Types: Cigarettes    Start date: 02/25/1953    Last attempt to quit: 04/04/1984    Years since quitting: 33.4  . Smokeless tobacco: Former Systems developer    Types: Chew    Quit date: 07/27/1986  . Tobacco comment: quit tobacco 26 years ago  Substance and Sexual Activity  . Alcohol use: No    Alcohol/week: 0.0 oz  . Drug use: No  . Sexual activity: Not Currently  Lifestyle  . Physical activity:    Days per week: Not on file    Minutes per session: Not on file  . Stress: Not on file  Relationships  . Social connections:    Talks on phone: Not on file    Gets together: Not on file    Attends religious service: Not on file    Active member of club or organization: Not on file    Attends meetings of clubs or organizations: Not on file    Relationship status: Not on file  Other Topics Concern  . Not on file  Social History Narrative   Lives in [place], [place]   Regular exercise--no, mowing grass   Diet: fruit and veggies    Outpatient Encounter Medications as of 08/30/2017   Medication Sig  . aspirin EC 81 MG tablet Take 81 mg by mouth daily.  Marland Kitchen atorvastatin (LIPITOR) 40 MG tablet TAKE 1 TABLET BY MOUTH DAILY  . donepezil (ARICEPT) 10 MG tablet Take 1 tablet (10 mg total) by mouth at bedtime.  Marland Kitchen glipiZIDE (GLUCOTROL) 5 MG tablet TAKE 1 TABLET BY MOUTH DAILY BEFORE BREAKFAST  . metFORMIN (GLUCOPHAGE-XR) 500 MG  24 hr tablet TAKE 1 TABLET BY MOUTH TWICE A DAY  . Multiple Vitamins-Minerals (ICAPS LUTEIN & ZEAXANTHIN PO) Take 1 capsule by mouth 2 (two) times daily.   . Omega-3 Fatty Acids (FISH OIL) 1000 MG CAPS Take 1,000 mg by mouth at bedtime.  . sertraline (ZOLOFT) 50 MG tablet TAKE 1 TABLET BY MOUTH DAILY  . triamcinolone cream (KENALOG) 0.1 % Apply 1 application topically 2 (two) times daily.   No facility-administered encounter medications on file as of 08/30/2017.     Activities of Daily Living In your present state of health, do you have any difficulty performing the following activities: 08/30/2017  Hearing? N  Vision? Y  Comment left eye - partial blindness  Difficulty concentrating or making decisions? Y  Walking or climbing stairs? N  Dressing or bathing? N  Doing errands, shopping? Y  Comment drives in daytime only  Preparing Food and eating ? N  Using the Toilet? N  In the past six months, have you accidently leaked urine? Y  Do you have problems with loss of bowel control? N  Managing your Medications? N  Managing your Finances? Y  Comment spouse takes care of finances  Housekeeping or managing your Housekeeping? N  Some recent data might be hidden    Patient Care Team: [name], MD as PCP - General (Family Medicine)   Assessment:   This is a routine wellness examination for Issa.  Hearing Screening Comments: Bilateral hearing aids Vision Screening Comments: Eye exam approx. 6 mths ago; monthly eye injections with Dr. Herbert Deaner   Exercise Activities and Dietary recommendations Current Exercise Habits: Home exercise routine,  Type of exercise: walking, Time (Minutes): 30, Frequency (Times/Week): 7, Weekly Exercise (Minutes/Week): 210, Intensity: Mild, Exercise limited by: None identified  Goals    . Increase physical activity     Starting 08/30/2017, I will continue to walk at least 30 minutes daily.        Fall Risk Fall Risk  08/30/2017 08/15/2016 01/15/2015 07/31/2014 07/26/2013  Falls in the past year? No No No No No   Depression Screen PHQ 2/9 Scores 08/30/2017 08/15/2016 01/15/2015  PHQ - 2 Score 0 0 0  PHQ- 9 Score 0 - -    Cognitive Function - DX of Alzheimer's disease, mild MMSE - Mini Mental State Exam 08/30/2017 08/15/2016  Not completed: (No Data) -  Orientation to time - 5  Orientation to Place - 5  Registration - 3  Attention/ Calculation - 0  Recall - 3  Language- name 2 objects - 0  Language- repeat - 1  Language- follow 3 step command - 3  Language- read & follow direction - 0  Write a sentence - 0  Copy design - 0  Total score - 20        Immunization History  Administered Date(s) Administered  . Influenza Whole 01/03/2007  . Influenza,inj,Quad PF,6+ Mos 02/06/2013, 01/20/2014, 01/15/2015, 01/29/2016  . Influenza-Unspecified 01/02/2017  . Pneumococcal Conjugate-13 01/20/2014  . Pneumococcal Polysaccharide-23 01/02/2001  . Td 04/04/1994  . Tdap 01/25/2013    Screening Tests Health Maintenance  Topic Date Due  . FOOT EXAM  09/06/2017 (Originally 01/18/2016)  . INFLUENZA VACCINE  11/02/2017  . HEMOGLOBIN A1C  03/02/2018  . OPHTHALMOLOGY EXAM  04/05/2018  . URINE MICROALBUMIN  08/31/2018  . TETANUS/TDAP  01/26/2023  . PNA vac Low Risk Adult  Completed      Plan:     I have personally reviewed, addressed, and noted the following  in the patient's chart:  A. Medical and social history B. Use of alcohol, tobacco or illicit drugs  C. Current medications and supplements D. Functional ability and status E.  Nutritional status F.  Physical activity G. Advance  directives H. List of other physicians I.  Hospitalizations, surgeries, and ER visits in previous 12 months J.  [place] to include hearing, vision, cognitive, depression L. Referrals and appointments - none  In addition, I have reviewed and discussed with patient certain preventive protocols, quality metrics, and best practice recommendations. A written personalized care plan for preventive services as well as general preventive health recommendations were provided to patient.  See attached scanned questionnaire for additional information.   Signed,   Lindell Noe, MHA, BS, LPN Health Coach

## 2017-08-30 NOTE — Progress Notes (Signed)
PCP notes:   Health maintenance:  Foot exam - PCP please address A1C - completed Microalbumin - completed  Abnormal screenings:   None  Patient concerns:   None  Nurse concerns:  None  Next PCP appt:   09/06/17 @ [id number]

## 2017-08-30 NOTE — Patient Instructions (Signed)
[name] , Thank you for taking time to come for your Medicare Wellness Visit. I appreciate your ongoing commitment to your health goals. Please review the following plan we discussed and let me know if I can assist you in the future.   These are the goals we discussed: Goals    . Increase physical activity     Starting 08/30/2017, I will continue to walk at least 30 minutes daily.        This is a list of the screening recommended for you and due dates:  Health Maintenance  Topic Date Due  . Complete foot exam   09/06/2017*  . Flu Shot  11/02/2017  . Hemoglobin A1C  03/02/2018  . Eye exam for diabetics  04/05/2018  . Urine Protein Check  08/31/2018  . Tetanus Vaccine  01/26/2023  . Pneumonia vaccines  Completed  *Topic was postponed. The date shown is not the original due date.   Preventive Care for Adults  A healthy lifestyle and preventive care can promote health and wellness. Preventive health guidelines for adults include the following key practices.  . A routine yearly physical is a good way to check with your health care provider about your health and preventive screening. It is a chance to share any concerns and updates on your health and to receive a thorough exam.  . Visit your dentist for a routine exam and preventive care every 6 months. Brush your teeth twice a day and floss once a day. Good oral hygiene prevents tooth decay and gum disease.  . The frequency of eye exams is based on your age, health, family medical history, use  of contact lenses, and other factors. Follow your health care provider's recommendations for frequency of eye exams.  . Eat a healthy diet. Foods like vegetables, fruits, whole grains, low-fat dairy products, and lean protein foods contain the nutrients you need without too many calories. Decrease your intake of foods high in solid fats, added sugars, and salt. Eat the right amount of calories for you. Get information about a proper diet  from your health care provider, if necessary.  . Regular physical exercise is one of the most important things you can do for your health. Most adults should get at least 150 minutes of moderate-intensity exercise (any activity that increases your heart rate and causes you to sweat) each week. In addition, most adults need muscle-strengthening exercises on 2 or more days a week.  Silver Sneakers may be a benefit available to you. To determine eligibility, you may visit the website: www.silversneakers.com or contact program at 520-363-3332 Mon-Fri between 8AM-8PM.   . Maintain a healthy weight. The body mass index (BMI) is a screening tool to identify possible weight problems. It provides an estimate of body fat based on height and weight. Your health care provider can find your BMI and can help you achieve or maintain a healthy weight.   For adults 20 years and older: ? A BMI below 18.5 is considered underweight. ? A BMI of 18.5 to 24.9 is normal. ? A BMI of 25 to 29.9 is considered overweight. ? A BMI of 30 and above is considered obese.   . Maintain normal blood lipids and cholesterol levels by exercising and minimizing your intake of saturated fat. Eat a balanced diet with plenty of fruit and vegetables. Blood tests for lipids and cholesterol should begin at age 63 and be repeated every 5 years. If your lipid or cholesterol levels are high, you  are over 26, or you are at high risk for heart disease, you may need your cholesterol levels checked more frequently. Ongoing high lipid and cholesterol levels should be treated with medicines if diet and exercise are not working.  . If you smoke, find out from your health care provider how to quit. If you do not use tobacco, please do not start.  . If you choose to drink alcohol, please do not consume more than 2 drinks per day. One drink is considered to be 12 ounces (355 mL) of beer, 5 ounces (148 mL) of wine, or 1.5 ounces (44 mL) of liquor.  .  If you are 34-5 years old, ask your health care provider if you should take aspirin to prevent strokes.  . Use sunscreen. Apply sunscreen liberally and repeatedly throughout the day. You should seek shade when your shadow is shorter than you. Protect yourself by wearing long sleeves, pants, a wide-brimmed hat, and sunglasses year round, whenever you are outdoors.  . Once a month, do a whole body skin exam, using a mirror to look at the skin on your back. Tell your health care provider of new moles, moles that have irregular borders, moles that are larger than a pencil eraser, or moles that have changed in shape or color.

## 2017-08-31 DIAGNOSIS — E119 Type 2 diabetes mellitus without complications: Secondary | ICD-10-CM | POA: Diagnosis not present

## 2017-08-31 DIAGNOSIS — Z7984 Long term (current) use of oral hypoglycemic drugs: Secondary | ICD-10-CM | POA: Diagnosis not present

## 2017-08-31 DIAGNOSIS — N529 Male erectile dysfunction, unspecified: Secondary | ICD-10-CM | POA: Diagnosis not present

## 2017-08-31 DIAGNOSIS — Z833 Family history of diabetes mellitus: Secondary | ICD-10-CM | POA: Diagnosis not present

## 2017-08-31 DIAGNOSIS — Z7982 Long term (current) use of aspirin: Secondary | ICD-10-CM | POA: Diagnosis not present

## 2017-08-31 DIAGNOSIS — F0390 Unspecified dementia without behavioral disturbance: Secondary | ICD-10-CM | POA: Diagnosis not present

## 2017-08-31 DIAGNOSIS — R32 Unspecified urinary incontinence: Secondary | ICD-10-CM | POA: Diagnosis not present

## 2017-08-31 DIAGNOSIS — E785 Hyperlipidemia, unspecified: Secondary | ICD-10-CM | POA: Diagnosis not present

## 2017-08-31 DIAGNOSIS — R69 Illness, unspecified: Secondary | ICD-10-CM | POA: Diagnosis not present

## 2017-08-31 DIAGNOSIS — Z809 Family history of malignant neoplasm, unspecified: Secondary | ICD-10-CM | POA: Diagnosis not present

## 2017-09-02 DIAGNOSIS — E119 Type 2 diabetes mellitus without complications: Secondary | ICD-10-CM | POA: Diagnosis not present

## 2017-09-06 ENCOUNTER — Ambulatory Visit: Payer: Medicare HMO | Admitting: Podiatry

## 2017-09-06 ENCOUNTER — Encounter: Payer: Self-pay | Admitting: Family Medicine

## 2017-09-06 ENCOUNTER — Ambulatory Visit (INDEPENDENT_AMBULATORY_CARE_PROVIDER_SITE_OTHER): Payer: Medicare HMO | Admitting: Family Medicine

## 2017-09-06 VITALS — BP 138/64 | HR 71 | Temp 97.6°F | Ht 68.5 in | Wt 187.5 lb

## 2017-09-06 DIAGNOSIS — Z0000 Encounter for general adult medical examination without abnormal findings: Secondary | ICD-10-CM | POA: Diagnosis not present

## 2017-09-06 NOTE — Progress Notes (Signed)
Dr. Frederico Hamman T. Sten Dematteo, MD, [facility] [place], [address] Phone: [phone] Fax: [phone]  09/06/2017  Patient: [name], MRN: [id number], DOB: 07/04/1930, 84 y.o.  Primary Physician:  [name], MD   Chief Complaint  Patient presents with  . Annual Exam    Part 2   Subjective:   [name] is a 84 y.o. pleasant patient who presents with the following:  Preventative Health Maintenance Visit:  Health Maintenance Summary Reviewed and updated, unless pt declines services.  Tobacco History Reviewed. Alcohol: No concerns, no excessive use Exercise Habits: walking dog 3 times a day STD concerns: no risk or activity to increase risk Drug Use: None Encouraged self-testicular check  Had skin cancers on face  Health Maintenance  Topic Date Due  . FOOT EXAM  09/06/2017 (Originally 01/18/2016)  . INFLUENZA VACCINE  11/02/2017  . HEMOGLOBIN A1C  03/02/2018  . OPHTHALMOLOGY EXAM  04/05/2018  . URINE MICROALBUMIN  08/31/2018  . TETANUS/TDAP  01/26/2023  . PNA vac Low Risk Adult  Completed   Immunization History  Administered Date(s) Administered  . Influenza Whole 01/03/2007  . Influenza,inj,Quad PF,6+ Mos 02/06/2013, 01/20/2014, 01/15/2015, 01/29/2016  . Influenza-Unspecified 01/02/2017  . Pneumococcal Conjugate-13 01/20/2014  . Pneumococcal Polysaccharide-23 01/02/2001  . Td 04/04/1994  . Tdap 01/25/2013   Patient Active Problem List   Diagnosis Date Noted  . CAD (coronary artery disease)     Priority: High  . Adenocarcinoma of left lung, stage 1 ([place]) 07/25/2011    Priority: High  . Cancer of prostate w/med recur risk (T2b-c or Gleason 7 or PSA 10-20) ([place]) 07/25/2011    Priority: High  . Type 2 diabetes mellitus with vascular disease ([place]) 05/21/2007    Priority: High  . Pericardial tamponade 09/17/2012    Priority: Medium  . Acute pericarditis, unspecified 09/13/2012   Priority: Medium  . HYPERCHOLESTEROLEMIA 05/21/2007    Priority: Medium  . Essential hypertension 01/08/2007    Priority: Medium  . Type 2 DM with CKD stage 3 and hypertension ([place]) 07/16/2015  . Macular degeneration of both eyes 01/21/2014  . 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 Nonmelanoma skin cancer 07/25/2011  . History of colonic polyps 07/25/2011  . VERTIGO 07/22/2008  . CARCINOMA, SKIN, SQUAMOUS CELL 01/08/2007  . Generalized anxiety disorder 01/08/2007  . ALZHEIMER'S DISEASE, MILD 01/08/2007   Past Medical History:  Diagnosis Date  . Adenocarcinoma of left lung, stage 1 ([place]) 2001   T1N0 stage I  adenoca left lung resected 01/03/00   . Alzheimer's disease   . CAD (coronary artery disease) 2014   a. 09/2012 Cath: LM nl, LAD 50-60p, D1 60-70 m, D1 50ost, LCX nl, RCA min irregs, EF 55-65%.  . Generalized anxiety disorder 01/08/2007   Qualifier: Diagnosis of  By: Diona Browner MD, [name]    . History of colonic polyps 07/25/2011  . Macular degeneration of both eyes 01/21/2014  . Nonmelanoma skin cancer 07/25/2011   Multiple lesions excised face/nose  . Pericarditis    a. 09/2012 with effusion and tamponade, s/p window.  b.  F/u Echo 09/19/12: mod LVH, EF 55%, Gr 1 DD, Tr MR, mild RVE, no residual effusion  . Prostate CA ([place]) 04/2001   Gleason 7  S/P prostatectomy 04/11/01  . Squamous cell carcinoma of skin   . 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 Type 2 diabetes mellitus with vascular disease ([place]) 05/21/2007   Qualifier: Diagnosis of  ByDiona Browner MD, [name]    . Type 2 DM with CKD stage 3 and hypertension ([place]) 07/16/2015  . Unspecified essential hypertension    Past Surgical History:  Procedure Laterality Date  . HERNIA REPAIR    . LEFT HEART CATHETERIZATION WITH CORONARY ANGIOGRAM N/A 09/13/2012   Procedure: LEFT HEART CATHETERIZATION WITH CORONARY ANGIOGRAM;  Surgeon: Sherren Mocha, MD;  Location: [facility] CATH LAB;  Service: Cardiovascular;  Laterality: N/A;  . LOBECTOMY  2001    upper left  . PERICARDIAL TAP N/A 09/16/2012   Procedure: PERICARDIAL TAP;  Surgeon: Sinclair Grooms, MD;  Location: [facility] CATH LAB;  Service: Cardiovascular;  Laterality: N/A;  . PROSTATECTOMY  2003  . SUBXYPHOID PERICARDIAL WINDOW N/A 09/16/2012   Procedure: SUBXYPHOID PERICARDIAL WINDOW;  Surgeon: Rexene Alberts, MD;  Location: [facility] OR;  Service: Thoracic;  Laterality: N/A;  . TONSILLECTOMY     Social History   Socioeconomic History  . Marital status: Married    Spouse name: Not on file  . Number of children: Not on file  . Years of education: Not on file  . Highest education level: Not on file  Occupational History  . Occupation: retired  Scientific laboratory technician  . Financial resource strain: Not on file  . Food insecurity:    Worry: Not on file    Inability: Not on file  . Transportation needs:    Medical: Not on file    Non-medical: Not on file  Tobacco Use  . Smoking status: Former Smoker    Packs/day: 2.00    Years: 42.00    Pack years: 84.00    Types: Cigarettes    Start date: 02/25/1953    Last attempt to quit: 04/04/1984    Years since quitting: 33.4  . Smokeless tobacco: Former Systems developer    Types: Chew    Quit date: 07/27/1986  . Tobacco comment: quit tobacco 26 years ago  Substance and Sexual Activity  . Alcohol use: No    Alcohol/week: 0.0 oz  . Drug use: No  . Sexual activity: Not Currently  Lifestyle  . Physical activity:    Days per week: Not on file    Minutes per session: Not on file  . Stress: Not on file  Relationships  . Social connections:    Talks on phone: Not on file    Gets together: Not on file    Attends religious service: Not on file    Active member of club or organization: Not on file    Attends meetings of clubs or organizations: Not on file    Relationship status: Not on file  . Intimate partner violence:    Fear of current or ex partner: Not on file    Emotionally abused: Not on file    Physically abused: Not on file    Forced sexual activity:  Not on file  Other Topics Concern  . Not on file  Social History Narrative   Lives in [place], [place]   Regular exercise--no, mowing grass   Diet: fruit and veggies   Family History  Problem Relation Age of Onset  . Cancer Father        colon  . Dementia Mother   . Diabetes Mother   . Cancer Brother        lung   Allergies  Allergen Reactions  . Sulfa Antibiotics Nausea Only    Medication list has been reviewed and updated.   General: Denies fever, chills, sweats. No significant weight  loss. Eyes: Denies blurring,significant itching ENT: Denies earache, sore throat, and hoarseness. Cardiovascular: Denies chest pains, palpitations, dyspnea on exertion Respiratory: Denies cough, dyspnea at rest,wheeezing Breast: no concerns about lumps GI: Denies nausea, vomiting, diarrhea, constipation, change in bowel habits, abdominal pain, melena, hematochezia GU: Denies penile discharge, ED, urinary flow / outflow problems. No STD concerns. Musculoskeletal: Denies back pain, joint pain Derm: Denies rash, itching Neuro: Denies  paresthesias, frequent falls, frequent headaches Psych: Denies depression, anxiety Endocrine: Denies cold intolerance, heat intolerance, polydipsia Heme: Denies enlarged lymph nodes Allergy: No hayfever  Objective:   BP 138/64   Pulse 71   Temp 97.6 F (36.4 C) (Oral)   Ht 5' 8.5" (1.74 m)   Wt 187 lb 8 oz (85 kg)   BMI 28.09 kg/m  Ideal Body Weight: Weight in (lb) to have BMI = 25: 166.5  No exam data present  GEN: well developed, well nourished, no acute distress Eyes: conjunctiva and lids normal, PERRLA, EOMI ENT: TM clear, nares clear, oral exam WNL Neck: supple, no lymphadenopathy, no thyromegaly, no JVD Pulm: clear to auscultation and percussion, respiratory effort normal CV: regular rate and rhythm, S1-S2, no murmur, rub or gallop, no bruits, peripheral pulses normal and symmetric, no cyanosis, clubbing, edema or varicosities GI: soft,  non-tender; no hepatosplenomegaly, masses; active bowel sounds all quadrants GU: no hernia, testicular mass, penile discharge Lymph: no cervical, axillary or inguinal adenopathy MSK: gait normal, muscle tone and strength WNL, no joint swelling, effusions, discoloration, crepitus  SKIN: clear, good turgor, color WNL, no rashes, lesions, or ulcerations Neuro: normal mental status, normal strength, sensation, and motion Psych: alert; oriented to person, place and time, normally interactive and not anxious or depressed in appearance. All labs reviewed with patient.  Lipids:    Component Value Date/Time   CHOL 104 08/30/2017 1104   TRIG 176.0 (H) 08/30/2017 1104   HDL 31.80 (L) 08/30/2017 1104   LDLDIRECT 54.0 08/30/2017 1104   VLDL 35.2 08/30/2017 1104   CHOLHDL 3 08/30/2017 1104   CBC: CBC Latest Ref Rng & Units 08/30/2017 08/15/2016 07/30/2015  WBC 4.0 - 10.5 K/uL 7.5 8.9 6.3  Hemoglobin 13.0 - 17.0 g/dL 13.6 14.1 13.7  Hematocrit 39.0 - 52.0 % 40.1 42.0 41.2  Platelets 150.0 - 400.0 K/uL 152.0 168.0 137(L)    Basic Metabolic Panel:    Component Value Date/Time   NA 138 08/30/2017 1104   NA 143 07/30/2015 0957   K 5.4 (H) 08/30/2017 1104   K 5.3 No visable hemolysis (H) 07/30/2015 0957   CL 103 08/30/2017 1104   CL 103 07/20/2012 1046   CO2 31 08/30/2017 1104   CO2 28 07/30/2015 0957   BUN 18 08/30/2017 1104   BUN 14.4 07/30/2015 0957   CREATININE 1.28 08/30/2017 1104   CREATININE 1.5 (H) 07/30/2015 0957   GLUCOSE 188 (H) 08/30/2017 1104   GLUCOSE 231 (H) 07/30/2015 0957   GLUCOSE 215 (H) 07/20/2012 1046   CALCIUM 9.1 08/30/2017 1104   CALCIUM 9.5 07/30/2015 0957   Hepatic Function Latest Ref Rng & Units 08/30/2017 08/15/2016 07/30/2015  Total Protein 6.0 - 8.3 g/dL 6.8 7.2 6.8  Albumin 3.5 - 5.2 g/dL 4.1 4.6 3.9  AST 0 - 37 U/L _0 ALT 0 - 53 U/L _1 Alk Phosphatase 39 - 117 U/L 64 82 88  Total Bilirubin 0.2 - 1.2 mg/dL 0.5 0.6 0.43  Bilirubin, Direct 0.0 -  0.3 mg/dL 0.1 - -  Lab Results  Component Value Date   TSH 1.110 04/19/2011   Assessment and Plan:   Healthcare maintenance  Doing really well overall.   Health Maintenance Exam: The patient's preventative maintenance and recommended screening tests for an annual wellness exam were reviewed in full today. Brought up to date unless services declined.  Counselled on the importance of diet, exercise, and its role in overall health and mortality. The patient's FH and SH was reviewed, including their home life, tobacco status, and drug and alcohol status.  Follow-up in 1 year for physical exam or additional follow-up below.  Follow-up: No follow-ups on file. Or follow-up in 1 year if not noted.  Signed,  [name]. Rael Yo, MD   Allergies as of 09/06/2017      Reactions   Sulfa Antibiotics Nausea Only      Medication List        Accurate as of 09/06/17  8:38 AM. Always use your most recent med list.          aspirin EC 81 MG tablet Take 81 mg by mouth daily.   atorvastatin 40 MG tablet Commonly known as:  LIPITOR TAKE 1 TABLET BY MOUTH DAILY   donepezil 10 MG tablet Commonly known as:  ARICEPT Take 1 tablet (10 mg total) by mouth at bedtime.   Fish Oil 1000 MG Caps Take 1,000 mg by mouth at bedtime.   glipiZIDE 5 MG tablet Commonly known as:  GLUCOTROL TAKE 1 TABLET BY MOUTH DAILY BEFORE BREAKFAST   ICAPS LUTEIN & ZEAXANTHIN PO Take 1 capsule by mouth 2 (two) times daily.   metFORMIN 500 MG 24 hr tablet Commonly known as:  GLUCOPHAGE-XR TAKE 1 TABLET BY MOUTH TWICE A DAY   sertraline 50 MG tablet Commonly known as:  ZOLOFT TAKE 1 TABLET BY MOUTH DAILY   triamcinolone cream 0.1 % Commonly known as:  KENALOG Apply 1 application topically 2 (two) times daily.

## 2017-09-19 ENCOUNTER — Other Ambulatory Visit: Payer: Self-pay | Admitting: Family Medicine

## 2017-09-27 ENCOUNTER — Other Ambulatory Visit: Payer: Self-pay | Admitting: Family Medicine

## 2017-09-29 ENCOUNTER — Encounter: Payer: Self-pay | Admitting: Podiatry

## 2017-09-29 ENCOUNTER — Ambulatory Visit: Payer: Medicare HMO | Admitting: Podiatry

## 2017-09-29 VITALS — BP 149/83 | HR 65 | Resp 16

## 2017-09-29 DIAGNOSIS — B351 Tinea unguium: Secondary | ICD-10-CM

## 2017-09-29 DIAGNOSIS — M79674 Pain in right toe(s): Secondary | ICD-10-CM

## 2017-09-29 DIAGNOSIS — M79675 Pain in left toe(s): Secondary | ICD-10-CM

## 2017-09-29 DIAGNOSIS — E1151 Type 2 diabetes mellitus with diabetic peripheral angiopathy without gangrene: Secondary | ICD-10-CM

## 2017-09-29 NOTE — Patient Instructions (Signed)
Fungal Nail Infection Fungal nail infection is a common fungal infection of the toenails or fingernails. This condition affects toenails more often than fingernails. More than one nail may be infected. The condition can be passed from person to person (is contagious). What are the causes? This condition is caused by a fungus. Several types of funguses can cause the infection. These funguses are common in moist and warm areas. If your hands or feet come into contact with the fungus, it may get into a crack in your fingernail or toenail and cause the infection. What increases the risk? The following factors may make you more likely to develop this condition:  Being male.  Having diabetes.  Being of older age.  Living with someone who has the fungus.  Walking barefoot in areas where the fungus thrives, such as showers or locker rooms.  Having poor circulation.  Wearing shoes and socks that cause your feet to sweat.  Having athlete's foot.  Having a nail injury or history of a recent nail surgery.  Having psoriasis.  Having a weak body defense system (immune system).  What are the signs or symptoms? Symptoms of this condition include:  A pale spot on the nail.  Thickening of the nail.  A nail that becomes yellow or brown.  A brittle or ragged nail edge.  A crumbling nail.  A nail that has lifted away from the nail bed.  How is this diagnosed? This condition is diagnosed with a physical exam. Your health care provider may take a scraping or clipping from your nail to test for the fungus. How is this treated? Mild infections do not need treatment. If you have significant nail changes, treatment may include:  Oral antifungal medicines. You may need to take the medicine for several weeks or several months, and you may not see the results for a long time. These medicines can cause side effects. Ask your health care provider what problems to watch for.  Antifungal nail polish  and nail cream. These may be used along with oral antifungal medicines.  Laser treatment of the nail.  Surgery to remove the nail. This may be needed for the most severe infections.  Treatment takes a long time, and the infection may come back. Follow these instructions at home: Medicines  Take or apply over-the-counter and prescription medicines only as told by your health care provider.  Ask your health care provider about using over-the-counter mentholated ointment on your nails. Lifestyle   Do not share personal items, such as towels or nail clippers.  Trim your nails often.  Wash and dry your hands and feet every day.  Wear absorbent socks, and change your socks frequently.  Wear shoes that allow air to circulate, such as sandals or canvas tennis shoes. Throw out old shoes.  Wear rubber gloves if you are working with your hands in wet areas.  Do not walk barefoot in shower rooms or locker rooms.  Do not use a nail salon that does not use clean instruments.  Do not use artificial nails. General instructions  Keep all follow-up visits as told by your health care provider. This is important.  Use antifungal foot powder on your feet and in your shoes. Contact a health care provider if: Your infection is not getting better or it is getting worse after several months. This information is not intended to replace advice given to you by your health care provider. Make sure you discuss any questions you have with your health   care provider. Document Released: 03/18/2000 Document Revised: 08/27/2015 Document Reviewed: 09/22/2014 Elsevier Interactive Patient Education  2018 [employer]. Diabetes and Foot Care Diabetes may cause you to have problems because of poor blood supply (circulation) to your feet and legs. This may cause the skin on your feet to become thinner, break easier, and heal more slowly. Your skin may become dry, and the skin may peel and crack. You may also have  nerve damage in your legs and feet causing decreased feeling in them. You may not notice minor injuries to your feet that could lead to infections or more serious problems. Taking care of your feet is one of the most important things you can do for yourself. Follow these instructions at home:  Wear shoes at all times, even in the house. Do not go barefoot. Bare feet are easily injured.  Check your feet daily for blisters, cuts, and redness. If you cannot see the bottom of your feet, use a mirror or ask someone for help.  Wash your feet with warm water (do not use hot water) and mild soap. Then pat your feet and the areas between your toes until they are completely dry. Do not soak your feet as this can dry your skin.  Apply a moisturizing lotion or petroleum jelly (that does not contain alcohol and is unscented) to the skin on your feet and to dry, brittle toenails. Do not apply lotion between your toes.  Trim your toenails straight across. Do not dig under them or around the cuticle. File the edges of your nails with an emery board or nail file.  Do not cut corns or calluses or try to remove them with medicine.  Wear clean socks or stockings every day. Make sure they are not too tight. Do not wear knee-high stockings since they may decrease blood flow to your legs.  Wear shoes that fit properly and have enough cushioning. To break in new shoes, wear them for just a few hours a day. This prevents you from injuring your feet. Always look in your shoes before you put them on to be sure there are no objects inside.  Do not cross your legs. This may decrease the blood flow to your feet.  If you find a minor scrape, cut, or break in the skin on your feet, keep it and the skin around it clean and dry. These areas may be cleansed with mild soap and water. Do not cleanse the area with peroxide, alcohol, or iodine.  When you remove an adhesive bandage, be sure not to damage the skin around it.  If  you have a wound, look at it several times a day to make sure it is healing.  Do not use heating pads or hot water bottles. They may burn your skin. If you have lost feeling in your feet or legs, you may not know it is happening until it is too late.  Make sure your health care provider performs a complete foot exam at least annually or more often if you have foot problems. Report any cuts, sores, or bruises to your health care provider immediately. Contact a health care provider if:  You have an injury that is not healing.  You have cuts or breaks in the skin.  You have an ingrown nail.  You notice redness on your legs or feet.  You feel burning or tingling in your legs or feet.  You have pain or cramps in your legs and feet.  Your  legs or feet are numb.  Your feet always feel cold. Get help right away if:  There is increasing redness, swelling, or pain in or around a wound.  There is a red line that goes up your leg.  Pus is coming from a wound.  You develop a fever or as directed by your health care provider.  You notice a bad smell coming from an ulcer or wound. This information is not intended to replace advice given to you by your health care provider. Make sure you discuss any questions you have with your health care provider. Document Released: 03/18/2000 Document Revised: 08/27/2015 Document Reviewed: 08/28/2012 Elsevier Interactive Patient Education  2017 [employer].

## 2017-09-30 NOTE — Progress Notes (Signed)
Subjective: [name] is an 84 y.o. WM who presents today with diabetes, PAD and cc of painful, discolored, thick toenails which interfere with activities of daily living such as cleaning, cooking and performing routine tasks. Duration greater than one month. Nature of pan is tenderness. Onset is gradual. Pain is aggravated when wearing enclosed shoe gear. Pain is getting progressively worse and relieved when shoe gear removed. He denies any attempt at treatment due to him being diabetic and taking aspirin.  Last HgA1C: 6.7%  Medical History Collapse by Default  Collapse by Default      07/16/2015 Type 2 DM with CKD stage 3 and hypertension ([place])  01/21/2014 Macular degeneration of both eyes  02/08/2013 Tobacco abuse, in remission  2014 CAD (coronary artery disease)   07/25/2011 Nonmelanoma skin cancer   07/25/2011 History of colonic polyps  05/21/2007 Type 2 diabetes mellitus with vascular disease ([place])   01/08/2007 Generalized anxiety disorder   04/2001 Prostate CA ([place])   2001 Adenocarcinoma of left lung, stage 1 ([facility])   Date Unknown Alzheimer's disease  Date Unknown Pericarditis   Date Unknown Squamous cell carcinoma of skin  Date Unknown Unspecified essential hypertension    Problem List Collapse by Default  Collapse by Default      Cardiovascular and Mediastinum   Type 2 diabetes mellitus with vascular disease ([place])   Essential hypertension   Acute pericarditis, unspecified   Pericardial tamponade   CAD (coronary artery disease)   Type 2 DM with CKD stage 3 and hypertension ([place])  Respiratory   Adenocarcinoma of left lung, stage 1 ([facility])  Nervous and Auditory   ALZHEIMER'S DISEASE, MILD   Sensorineural hearing loss (SNHL) of both ears  Musculoskeletal and Integument   CARCINOMA, SKIN, SQUAMOUS CELL   Nonmelanoma skin cancer  Genitourinary   Cancer of prostate w/med recur risk (T2b-c or Gleason 7 or PSA 10-20) ([facility])  Other   HYPERCHOLESTEROLEMIA    Generalized anxiety disorder   VERTIGO   History of colonic polyps   Tobacco abuse, in remission   Macular degeneration of both eyes   Tinnitus, bilateral   Surgical History Collapse by Default  Collapse by Default      09/16/2012 Subxyphoid pericardial window (N/A)   09/16/2012 Pericardial tap (N/A)   09/13/2012 Left heart catheterization with coronary angiogram (N/A)   2003 Prostatectomy  2001 Lobectomy   Date Unknown Hernia repair  Date Unknown Tonsillectomy   Medications    aspirin EC 81 MG tablet    atorvastatin (LIPITOR) 40 MG tablet    donepezil (ARICEPT) 10 MG tablet    glipiZIDE (GLUCOTROL) 5 MG tablet    metFORMIN (GLUCOPHAGE-XR) 500 MG 24 hr tablet    Multiple Vitamins-Minerals (ICAPS LUTEIN & ZEAXANTHIN PO)    Omega-3 Fatty Acids (FISH OIL) 1000 MG CAPS    sertraline (ZOLOFT) 50 MG tablet    triamcinolone cream (KENALOG) 0.1 %    Allergies     Sulfa AntibioticsNausea Only   Tobacco History Collapse by Default  Collapse by Default      Smoking Status  Former Smoker  Started  02/25/1953 - 04/04/1984 Quit  Types  Cigarettes  Amount 2 packs/day for 42 years (84.00 pk-yrs)      Smokeless Tobacco Status  Former User  Quit 07/27/1986  Types  Chew        Comment  quit tobacco 26 years ago    Objective: Vitals:   09/29/17 1058  BP: (!) 149/83  Pulse: 65  Resp: 16  Vascular Examination: Capillary refill time <3 seconds x 10 digits Dorsalis pedis pulses present b/l Posterior tibial pulses absent b/l No digital hair x 10 digits Skin temperature warm to cool b/l Trace ankle edema BLE +Varicosities b/l LE  Dermatological Examination: Skin thin and atrophic b/l Toenails 1-5 b/l discolored, thick, dystrophic with subungual debris and pain with palpation to nailbeds due to thickness of nails.   Musculoskeletal: Muscle strength 5/5 to all LE muscle groups  Neurological: Sensation intact with 10 gram monofilament. Vibratory sensation  intact.  Assessment: 1. Painful onychomycosis toenails 1-5 b/l 2. NIDDM with Peripheral arterial disease   Plan: 1. Discuss diabetic foot care principles. Literature/AVS dispensed. 2. Discussed onychomycosis and topical treatment options. He would like to think about it. Toenails 1-5 b/l were debrided in length and girth without iatrogenic bleeding. 3. Patient to continue soft, supportive shoe gear 4. Patient to report any pedal injuries to medical professional 5. All questions/concerns addressed today per patient  6. Follow up 3 months. Patient/POA to call should there be a concern in the interim.

## 2017-10-06 ENCOUNTER — Other Ambulatory Visit: Payer: Self-pay | Admitting: Family Medicine

## 2017-10-11 NOTE — Progress Notes (Signed)
I reviewed health advisor's note, was available for consultation, and agree with documentation and plan.   Signed,  Alanmichael Barmore T. Phinley Schall, MD  

## 2017-10-12 ENCOUNTER — Encounter (INDEPENDENT_AMBULATORY_CARE_PROVIDER_SITE_OTHER): Payer: Medicare HMO | Admitting: Ophthalmology

## 2017-10-12 DIAGNOSIS — E113291 Type 2 diabetes mellitus with mild nonproliferative diabetic retinopathy without macular edema, right eye: Secondary | ICD-10-CM

## 2017-10-12 DIAGNOSIS — H353231 Exudative age-related macular degeneration, bilateral, with active choroidal neovascularization: Secondary | ICD-10-CM

## 2017-10-12 DIAGNOSIS — H43813 Vitreous degeneration, bilateral: Secondary | ICD-10-CM

## 2017-10-12 DIAGNOSIS — E11319 Type 2 diabetes mellitus with unspecified diabetic retinopathy without macular edema: Secondary | ICD-10-CM | POA: Diagnosis not present

## 2017-10-12 DIAGNOSIS — E113392 Type 2 diabetes mellitus with moderate nonproliferative diabetic retinopathy without macular edema, left eye: Secondary | ICD-10-CM

## 2017-11-07 ENCOUNTER — Encounter: Payer: Self-pay | Admitting: Family Medicine

## 2017-11-07 ENCOUNTER — Ambulatory Visit (INDEPENDENT_AMBULATORY_CARE_PROVIDER_SITE_OTHER): Payer: Medicare HMO | Admitting: Family Medicine

## 2017-11-07 VITALS — BP 140/82 | HR 79 | Temp 98.3°F | Wt 186.6 lb

## 2017-11-07 DIAGNOSIS — J22 Unspecified acute lower respiratory infection: Secondary | ICD-10-CM

## 2017-11-07 DIAGNOSIS — R0989 Other specified symptoms and signs involving the circulatory and respiratory systems: Secondary | ICD-10-CM | POA: Diagnosis not present

## 2017-11-07 DIAGNOSIS — R05 Cough: Secondary | ICD-10-CM

## 2017-11-07 DIAGNOSIS — R059 Cough, unspecified: Secondary | ICD-10-CM

## 2017-11-07 MED ORDER — GUAIFENESIN ER 600 MG PO TB12: 600.0000 mg | ORAL_TABLET | Freq: Two times a day (BID) | ORAL | 0 refills | Status: AC

## 2017-11-07 MED ORDER — DOXYCYCLINE HYCLATE 100 MG PO TABS: 100.0000 mg | ORAL_TABLET | Freq: Two times a day (BID) | ORAL | 0 refills | Status: AC

## 2017-11-07 NOTE — Progress Notes (Signed)
Subjective:    Patient ID: [name], male    DOB: 02/19/1931, 84 y.o.   MRN: [id number]  HPI   Patient presents to clinic c/o productive cough, congestion for over 1 week.  Patient describes his phlegm as thick green-brown in color. Wife states patient has a pro-air inhaler at home but has not needed to use.   Denies fever or chills.  Unknown if he has had any sick contacts.  Has CPE in June 2019 and overall was noted to be doing very well by his PCP - note reviewed.   Patient Active Problem List   Diagnosis Date Noted  . Sensorineural hearing loss (SNHL) of both ears 05/15/2017  . Tinnitus, bilateral 05/15/2017  . Type 2 DM with CKD stage 3 and hypertension ([place]) 07/16/2015  . Macular degeneration of both eyes 01/21/2014  . 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 CAD (coronary artery disease)   . Pericardial tamponade 09/17/2012  . Acute pericarditis, unspecified 09/13/2012  . Adenocarcinoma of left lung, stage 1 ([place]) 07/25/2011  . Cancer of prostate w/med recur risk (T2b-c or Gleason 7 or PSA 10-20) ([place]) 07/25/2011  . Nonmelanoma skin cancer 07/25/2011  . History of colonic polyps 07/25/2011  . VERTIGO 07/22/2008  . Type 2 diabetes mellitus with vascular disease ([place]) 05/21/2007  . HYPERCHOLESTEROLEMIA 05/21/2007  . CARCINOMA, SKIN, SQUAMOUS CELL 01/08/2007  . Generalized anxiety disorder 01/08/2007  . ALZHEIMER'S DISEASE, MILD 01/08/2007  . Essential hypertension 01/08/2007   Social History   Tobacco Use  . Smoking status: Former Smoker    Packs/day: 2.00    Years: 42.00    Pack years: 84.00    Types: Cigarettes    Start date: 02/25/1953    Last attempt to quit: 04/04/1984    Years since quitting: 33.6  . Smokeless tobacco: Former Systems developer    Types: Chew    Quit date: 07/27/1986  . Tobacco comment: quit tobacco 26 years ago  Substance Use Topics  . Alcohol use: No    Alcohol/week: 0.0 oz   Review of Systems  Constitutional: Negative for chills and  fever.  HENT: Positive for congestion. Negative for ear pain and trouble swallowing.        Sinus and chest congestion.   Eyes: Negative for pain, discharge and itching.  Respiratory: Positive for cough. Negative for choking, chest tightness and wheezing.        Productive cough.  Cardiovascular: Negative for chest pain, palpitations and leg swelling.  Gastrointestinal: Negative.   Genitourinary: Negative.   Musculoskeletal: Negative.   Skin: Negative for color change, pallor and rash.  Neurological: Negative for dizziness, syncope and light-headedness.      Objective:   Physical Exam  Constitutional: He is oriented to person, place, and time. He appears well-developed and well-nourished. No distress.  Accompanied by wife  HENT:  Head: Normocephalic and atraumatic.  Eyes: Pupils are equal, round, and reactive to light. EOM are normal. Right eye exhibits no discharge. Left eye exhibits no discharge. No scleral icterus.  Neck: Normal range of motion. Neck supple. No tracheal deviation present.  Cardiovascular: Normal rate, regular rhythm and normal heart sounds.  Pulmonary/Chest: Effort normal. No respiratory distress.  Rales bilat lower lobes. Harsh wet sounding cough. Lung sounds improve after a few good coughs.   Neurological: He is alert and oriented to person, place, and time.  Has dx of alzheimer's, on Aricept for this  Skin: Skin is warm and dry. No pallor.  Nursing note  and vitals reviewed.   Vitals:   11/07/17 1259  BP: 140/82  Pulse: 79  Temp: 98.3 F (36.8 C)  SpO2: 96%      Assessment & Plan:   Resp tract infection -patient will take doxycycline twice a day for 7 days.  Also advised to increase fluids, get plenty of rest, and be sure to do good handwashing.  Due to patient age and coloration description of phlegm produced by cough I want to be sure and treat vigilantly in the outpatient setting to avoid patient ending up in the emergency room or  hospital.  Productive cough-patient will use Mucinex 1 tablet 2 times a day as needed for cough.     Patient also advised to use 2 puffs of his pro-air if needed for any shortness of breath or wheezing symptoms.  Patient also instructed that if Pro Air does not relieve shortness of breath or wheezing symptoms he is to contact the office immediately or go to emergency department.  Advised he can follow-up with PCP or here as needed for any worsening or nonimprovement of symptoms.

## 2017-11-07 NOTE — Patient Instructions (Signed)
Great to meet you!  Doxy 1 tablet 2 times per day for 7 days  Mucinex 1 tablet 2 times per day as needed for cough  Increase fluids, rest, do good handwashing

## 2017-11-30 DIAGNOSIS — H02132 Senile ectropion of right lower eyelid: Secondary | ICD-10-CM | POA: Diagnosis not present

## 2017-11-30 DIAGNOSIS — H02411 Mechanical ptosis of right eyelid: Secondary | ICD-10-CM | POA: Diagnosis not present

## 2017-11-30 DIAGNOSIS — H02142 Spastic ectropion of right lower eyelid: Secondary | ICD-10-CM | POA: Diagnosis not present

## 2017-11-30 DIAGNOSIS — H16212 Exposure keratoconjunctivitis, left eye: Secondary | ICD-10-CM | POA: Diagnosis not present

## 2017-11-30 DIAGNOSIS — H02112 Cicatricial ectropion of right lower eyelid: Secondary | ICD-10-CM | POA: Diagnosis not present

## 2017-11-30 DIAGNOSIS — H04123 Dry eye syndrome of bilateral lacrimal glands: Secondary | ICD-10-CM | POA: Diagnosis not present

## 2017-11-30 DIAGNOSIS — H02145 Spastic ectropion of left lower eyelid: Secondary | ICD-10-CM | POA: Diagnosis not present

## 2017-11-30 DIAGNOSIS — H04521 Eversion of right lacrimal punctum: Secondary | ICD-10-CM | POA: Diagnosis not present

## 2017-11-30 DIAGNOSIS — H16213 Exposure keratoconjunctivitis, bilateral: Secondary | ICD-10-CM | POA: Diagnosis not present

## 2017-11-30 DIAGNOSIS — H16211 Exposure keratoconjunctivitis, right eye: Secondary | ICD-10-CM | POA: Diagnosis not present

## 2017-12-02 DIAGNOSIS — E119 Type 2 diabetes mellitus without complications: Secondary | ICD-10-CM | POA: Diagnosis not present

## 2017-12-04 ENCOUNTER — Other Ambulatory Visit: Payer: Self-pay | Admitting: Family Medicine

## 2017-12-11 DIAGNOSIS — H353231 Exudative age-related macular degeneration, bilateral, with active choroidal neovascularization: Secondary | ICD-10-CM | POA: Diagnosis not present

## 2017-12-11 DIAGNOSIS — H35033 Hypertensive retinopathy, bilateral: Secondary | ICD-10-CM | POA: Diagnosis not present

## 2017-12-11 DIAGNOSIS — E113392 Type 2 diabetes mellitus with moderate nonproliferative diabetic retinopathy without macular edema, left eye: Secondary | ICD-10-CM | POA: Diagnosis not present

## 2017-12-11 DIAGNOSIS — H40013 Open angle with borderline findings, low risk, bilateral: Secondary | ICD-10-CM | POA: Diagnosis not present

## 2017-12-14 ENCOUNTER — Encounter (INDEPENDENT_AMBULATORY_CARE_PROVIDER_SITE_OTHER): Payer: Medicare HMO | Admitting: Ophthalmology

## 2017-12-18 ENCOUNTER — Encounter (INDEPENDENT_AMBULATORY_CARE_PROVIDER_SITE_OTHER): Payer: Medicare HMO | Admitting: Ophthalmology

## 2017-12-18 DIAGNOSIS — E11319 Type 2 diabetes mellitus with unspecified diabetic retinopathy without macular edema: Secondary | ICD-10-CM

## 2017-12-18 DIAGNOSIS — E113291 Type 2 diabetes mellitus with mild nonproliferative diabetic retinopathy without macular edema, right eye: Secondary | ICD-10-CM | POA: Diagnosis not present

## 2017-12-18 DIAGNOSIS — H43813 Vitreous degeneration, bilateral: Secondary | ICD-10-CM | POA: Diagnosis not present

## 2017-12-18 DIAGNOSIS — H353231 Exudative age-related macular degeneration, bilateral, with active choroidal neovascularization: Secondary | ICD-10-CM | POA: Diagnosis not present

## 2017-12-18 DIAGNOSIS — E113392 Type 2 diabetes mellitus with moderate nonproliferative diabetic retinopathy without macular edema, left eye: Secondary | ICD-10-CM

## 2017-12-22 ENCOUNTER — Encounter: Payer: Self-pay | Admitting: Podiatry

## 2017-12-22 ENCOUNTER — Ambulatory Visit: Payer: Medicare HMO | Admitting: Podiatry

## 2017-12-22 DIAGNOSIS — M79674 Pain in right toe(s): Secondary | ICD-10-CM | POA: Diagnosis not present

## 2017-12-22 DIAGNOSIS — E1151 Type 2 diabetes mellitus with diabetic peripheral angiopathy without gangrene: Secondary | ICD-10-CM | POA: Diagnosis not present

## 2017-12-22 DIAGNOSIS — B351 Tinea unguium: Secondary | ICD-10-CM

## 2017-12-22 DIAGNOSIS — M79675 Pain in left toe(s): Secondary | ICD-10-CM | POA: Diagnosis not present

## 2017-12-28 ENCOUNTER — Encounter: Payer: Self-pay | Admitting: Podiatry

## 2017-12-28 NOTE — Progress Notes (Signed)
Subjective: HUSAIN COSTABILE presents today for follow up diabetic foot care. He has painful mycotic toenails which interfere with daily activities and routine tasks. Pain is aggravated when wearing enclosed shoe gear and is elieved with periodic professional debridement.  Mr. Byrnes voices no new pedal concerns on today's visit.  Objective: Vascular Examination: Capillary refill time <3 seconds x 10 digits Dorsalis pedis pulses present b/l Posterior tibial pulses absent b/l No digital hair x 10 digits Skin temperature warm to cool b/l Trace ankle edema BLE +Varicosities b/l LE  Dermatological Examination: Skin thin, shiny and atrophic b/l Toenails 1-5 b/l discolored, thick, dystrophic with subungual debris and pain with palpation to nailbeds due to thickness of nails.  Musculoskeletal: Muscle strength 5/5 to all LE muscle groups  Neurological: Sensation diminished with 10 gram monofilament. Vibratory sensation diminished  Assessment: 1. Painful onychomycosis toenails 1-5 b/l 2. NIDDM with Peripheral arterial disease  Plan: 1. Continue diabetic foot care principles. Continue diabetic diet per PCP/dietician recommendations 2. Toenails 1-5 b/l were debrided in length and girth without iatrogenic bleeding. 3. Patient to continue soft, supportive shoe gear 4. Patient to report any pedal injuries to medical professional  5. Follow up 3 months.  6. Patient/POA to call should there be a concern in the interim.

## 2018-01-19 DIAGNOSIS — R69 Illness, unspecified: Secondary | ICD-10-CM | POA: Diagnosis not present

## 2018-01-22 DIAGNOSIS — R69 Illness, unspecified: Secondary | ICD-10-CM | POA: Diagnosis not present

## 2018-01-23 DIAGNOSIS — D049 Carcinoma in situ of skin, unspecified: Secondary | ICD-10-CM | POA: Diagnosis not present

## 2018-01-23 DIAGNOSIS — D229 Melanocytic nevi, unspecified: Secondary | ICD-10-CM | POA: Diagnosis not present

## 2018-01-25 ENCOUNTER — Ambulatory Visit: Payer: Medicare HMO

## 2018-02-05 ENCOUNTER — Telehealth: Payer: Self-pay

## 2018-02-05 NOTE — Telephone Encounter (Signed)
Primary Cardiologist: [facility] Pre-operative Risk Assessment    Request for surgical clearance:  1. What type of surgery is being performed?  Right lower eyelid ectropion repair  2. When is this surgery scheduled? 02/12/18  3. What type of clearance is required (medical clearance vs. Pharmacy clearance to hold med vs. Both)?  Both  4. Are there any medications that need to be held prior to surgery and how long? Aspirin  5. Practice name and name of physician performing surgery? [facility] (Dr. Isidoro Donning)  6. What is your office phone number [phone]    7.   What is your office fax number [phone]  8.   Anesthesia type (None, local, MAC, general) ? MAC   Lamar Laundry 02/05/2018, 4:14 PM  _________________________________________________________________   (provider comments below)

## 2018-02-07 NOTE — Telephone Encounter (Signed)
Left detailed message re: preop clearance below and that pt hasn't been seen since 2015 and will require a new pt appt before can be addressed.  Left detailed message for pt to call the office and make an appt.

## 2018-02-07 NOTE — Telephone Encounter (Signed)
   Primary [place], MD  Chart reviewed as part of pre-operative protocol coverage. Because of [name]'s past medical history and time since last visit, he/she will require a follow-up visit in order to better assess preoperative cardiovascular risk.  Last visit was in 2015.  Pre-op covering staff: - Please schedule appointment and call patient to inform them. - Please contact requesting surgeon's office via preferred method (i.e, phone, fax) to inform them of need for appointment prior to surgery.  It has been too far out since last appt for cardiologist to safely say whether OK to hold ASA; will need to be assessed at appt (no h/o MI, stroke or stenting per chart, but need to clarify history in the interim).   Charlie Pitter, PA-C  02/07/2018, 4:07 PM

## 2018-02-08 NOTE — Telephone Encounter (Signed)
pts wife called back to let me know that pt only saw Dr. Burt Knack for a hospital f/u and Dr. Burt Knack released him. She is going to contact the surgeons office to see why they need clearance and call me back.

## 2018-02-08 NOTE — Telephone Encounter (Signed)
Spoke with Ginger @ Dr. Clabe Seal office and she requested that we disregard the surgical clearance on behalf of [name]. Will delete out of the preop pool.

## 2018-02-08 NOTE — Telephone Encounter (Signed)
  Eliezer Lofts is calling to check on the medical clearance. Please [phone]

## 2018-02-08 NOTE — Telephone Encounter (Signed)
Spoke with pts wife re: appt for surgical clearance. She states that pt has saw someone not too long ago and was cleared for the other eye. There is no record of it anywhere in our system. She is going to call the surgeon's office and see who gave him clearance for the 1st surgery,  and if pt still needed an appt with [place], she will call back. I will leave this in preop pool and follow up on it tomorrow.

## 2018-02-08 NOTE — Telephone Encounter (Signed)
Anderson Malta, see below. Please find out who patient saw for clearance previously - does not appear to have been our team. Looks like surgery team calling for update. Please route back to the preop APP box if our input is needed any further. Dayna Dunn PA-C

## 2018-02-12 ENCOUNTER — Other Ambulatory Visit: Payer: Self-pay | Admitting: Ophthalmology

## 2018-02-12 DIAGNOSIS — H16212 Exposure keratoconjunctivitis, left eye: Secondary | ICD-10-CM | POA: Diagnosis not present

## 2018-02-12 DIAGNOSIS — H04561 Stenosis of right lacrimal punctum: Secondary | ICD-10-CM | POA: Diagnosis not present

## 2018-02-12 DIAGNOSIS — H04521 Eversion of right lacrimal punctum: Secondary | ICD-10-CM | POA: Diagnosis not present

## 2018-02-12 DIAGNOSIS — H16213 Exposure keratoconjunctivitis, bilateral: Secondary | ICD-10-CM | POA: Diagnosis not present

## 2018-02-12 DIAGNOSIS — H04123 Dry eye syndrome of bilateral lacrimal glands: Secondary | ICD-10-CM | POA: Diagnosis not present

## 2018-02-12 DIAGNOSIS — H11821 Conjunctivochalasis, right eye: Secondary | ICD-10-CM | POA: Diagnosis not present

## 2018-02-12 DIAGNOSIS — H02132 Senile ectropion of right lower eyelid: Secondary | ICD-10-CM | POA: Diagnosis not present

## 2018-02-12 DIAGNOSIS — H16211 Exposure keratoconjunctivitis, right eye: Secondary | ICD-10-CM | POA: Diagnosis not present

## 2018-02-12 DIAGNOSIS — H02112 Cicatricial ectropion of right lower eyelid: Secondary | ICD-10-CM | POA: Diagnosis not present

## 2018-02-12 DIAGNOSIS — D0410 Carcinoma in situ of skin of unspecified eyelid, including canthus: Secondary | ICD-10-CM | POA: Diagnosis not present

## 2018-02-12 DIAGNOSIS — H02142 Spastic ectropion of right lower eyelid: Secondary | ICD-10-CM | POA: Diagnosis not present

## 2018-02-12 DIAGNOSIS — H02532 Eyelid retraction right lower eyelid: Secondary | ICD-10-CM | POA: Diagnosis not present

## 2018-02-12 DIAGNOSIS — H02145 Spastic ectropion of left lower eyelid: Secondary | ICD-10-CM | POA: Diagnosis not present

## 2018-03-03 DIAGNOSIS — E119 Type 2 diabetes mellitus without complications: Secondary | ICD-10-CM | POA: Diagnosis not present

## 2018-03-05 ENCOUNTER — Encounter (INDEPENDENT_AMBULATORY_CARE_PROVIDER_SITE_OTHER): Payer: Medicare HMO | Admitting: Ophthalmology

## 2018-03-05 DIAGNOSIS — H353231 Exudative age-related macular degeneration, bilateral, with active choroidal neovascularization: Secondary | ICD-10-CM

## 2018-03-05 DIAGNOSIS — E11319 Type 2 diabetes mellitus with unspecified diabetic retinopathy without macular edema: Secondary | ICD-10-CM

## 2018-03-05 DIAGNOSIS — E113293 Type 2 diabetes mellitus with mild nonproliferative diabetic retinopathy without macular edema, bilateral: Secondary | ICD-10-CM

## 2018-03-13 ENCOUNTER — Ambulatory Visit: Payer: Medicare HMO | Admitting: Physician Assistant

## 2018-03-23 ENCOUNTER — Ambulatory Visit: Payer: Medicare HMO | Admitting: Podiatry

## 2018-04-06 ENCOUNTER — Other Ambulatory Visit: Payer: Self-pay | Admitting: Family Medicine

## 2018-06-02 DIAGNOSIS — E119 Type 2 diabetes mellitus without complications: Secondary | ICD-10-CM | POA: Diagnosis not present

## 2018-06-04 ENCOUNTER — Encounter (INDEPENDENT_AMBULATORY_CARE_PROVIDER_SITE_OTHER): Payer: Medicare HMO | Admitting: Ophthalmology

## 2018-06-04 DIAGNOSIS — E113291 Type 2 diabetes mellitus with mild nonproliferative diabetic retinopathy without macular edema, right eye: Secondary | ICD-10-CM | POA: Diagnosis not present

## 2018-06-04 DIAGNOSIS — H43813 Vitreous degeneration, bilateral: Secondary | ICD-10-CM | POA: Diagnosis not present

## 2018-06-04 DIAGNOSIS — E11319 Type 2 diabetes mellitus with unspecified diabetic retinopathy without macular edema: Secondary | ICD-10-CM

## 2018-06-04 DIAGNOSIS — H353231 Exudative age-related macular degeneration, bilateral, with active choroidal neovascularization: Secondary | ICD-10-CM | POA: Diagnosis not present

## 2018-06-04 DIAGNOSIS — E113392 Type 2 diabetes mellitus with moderate nonproliferative diabetic retinopathy without macular edema, left eye: Secondary | ICD-10-CM

## 2018-06-04 LAB — HM DIABETES EYE EXAM

## 2018-06-25 ENCOUNTER — Other Ambulatory Visit: Payer: Self-pay

## 2018-06-25 ENCOUNTER — Encounter (HOSPITAL_COMMUNITY): Payer: Self-pay

## 2018-06-25 ENCOUNTER — Emergency Department (HOSPITAL_COMMUNITY)
Admission: EM | Admit: 2018-06-25 | Discharge: 2018-06-26 | Disposition: A | Discharge status: Home / Self Care | Payer: Medicare HMO | Attending: Emergency Medicine | Admitting: Emergency Medicine

## 2018-06-25 ENCOUNTER — Emergency Department (HOSPITAL_COMMUNITY): Payer: Medicare HMO

## 2018-06-25 DIAGNOSIS — Z87891 Personal history of nicotine dependence: Secondary | ICD-10-CM | POA: Insufficient documentation

## 2018-06-25 DIAGNOSIS — Z7984 Long term (current) use of oral hypoglycemic drugs: Secondary | ICD-10-CM | POA: Diagnosis not present

## 2018-06-25 DIAGNOSIS — I129 Hypertensive chronic kidney disease with stage 1 through stage 4 chronic kidney disease, or unspecified chronic kidney disease: Secondary | ICD-10-CM | POA: Diagnosis not present

## 2018-06-25 DIAGNOSIS — E1122 Type 2 diabetes mellitus with diabetic chronic kidney disease: Secondary | ICD-10-CM | POA: Diagnosis not present

## 2018-06-25 DIAGNOSIS — R062 Wheezing: Secondary | ICD-10-CM | POA: Diagnosis not present

## 2018-06-25 DIAGNOSIS — Z79899 Other long term (current) drug therapy: Secondary | ICD-10-CM | POA: Insufficient documentation

## 2018-06-25 DIAGNOSIS — R079 Chest pain, unspecified: Secondary | ICD-10-CM

## 2018-06-25 DIAGNOSIS — Z7982 Long term (current) use of aspirin: Secondary | ICD-10-CM | POA: Insufficient documentation

## 2018-06-25 DIAGNOSIS — G309 Alzheimer's disease, unspecified: Secondary | ICD-10-CM | POA: Insufficient documentation

## 2018-06-25 DIAGNOSIS — N183 Chronic kidney disease, stage 3 (moderate): Secondary | ICD-10-CM | POA: Insufficient documentation

## 2018-06-25 DIAGNOSIS — I2510 Atherosclerotic heart disease of native coronary artery without angina pectoris: Secondary | ICD-10-CM | POA: Diagnosis not present

## 2018-06-25 DIAGNOSIS — R0789 Other chest pain: Secondary | ICD-10-CM | POA: Diagnosis not present

## 2018-06-25 DIAGNOSIS — R9431 Abnormal electrocardiogram [ECG] [EKG]: Secondary | ICD-10-CM | POA: Diagnosis not present

## 2018-06-25 DIAGNOSIS — I10 Essential (primary) hypertension: Secondary | ICD-10-CM | POA: Diagnosis not present

## 2018-06-25 LAB — CBC WITH DIFFERENTIAL/PLATELET
Abs Immature Granulocytes: 0.07 10*3/uL (ref 0.00–0.07)
Basophils Absolute: 0 10*3/uL (ref 0.0–0.1)
Basophils Relative: 0 %
Eosinophils Absolute: 0 10*3/uL (ref 0.0–0.5)
Eosinophils Relative: 0 %
HCT: 44.1 % (ref 39.0–52.0)
Hemoglobin: 14 g/dL (ref 13.0–17.0)
Immature Granulocytes: 1 %
Lymphocytes Relative: 5 %
Lymphs Abs: 0.5 10*3/uL — ABNORMAL LOW (ref 0.7–4.0)
MCH: 31.1 pg (ref 26.0–34.0)
MCHC: 31.7 g/dL (ref 30.0–36.0)
MCV: 98 fL (ref 80.0–100.0)
Monocytes Absolute: 1.3 10*3/uL — ABNORMAL HIGH (ref 0.1–1.0)
Monocytes Relative: 11 %
Neutro Abs: 9.7 10*3/uL — ABNORMAL HIGH (ref 1.7–7.7)
Neutrophils Relative %: 83 %
Platelets: 135 10*3/uL — ABNORMAL LOW (ref 150–400)
RBC: 4.5 MIL/uL (ref 4.22–5.81)
RDW: 12.1 % (ref 11.5–15.5)
WBC: 11.6 10*3/uL — ABNORMAL HIGH (ref 4.0–10.5)
nRBC: 0 % (ref 0.0–0.2)

## 2018-06-25 LAB — TROPONIN I: Troponin I: <0.03 ng/mL (ref <0.03)

## 2018-06-25 LAB — BASIC METABOLIC PANEL
Anion gap: 13 (ref 5–15)
BUN: 19 mg/dL (ref 8–23)
CO2: 25 mmol/L (ref 22–32)
Calcium: 9.6 mg/dL (ref 8.9–10.3)
Chloride: 99 mmol/L (ref 98–111)
Creatinine, Ser: 1.29 mg/dL — ABNORMAL HIGH (ref 0.61–1.24)
GFR calc Af Amer: 58 mL/min — ABNORMAL LOW (ref >60)
GFR calc non Af Amer: 50 mL/min — ABNORMAL LOW (ref >60)
Glucose, Bld: 185 mg/dL — ABNORMAL HIGH (ref 70–99)
Potassium: 4.3 mmol/L (ref 3.5–5.1)
Sodium: 137 mmol/L (ref 135–145)

## 2018-06-25 NOTE — ED Triage Notes (Signed)
Pt bib [facility] to [facility] ED from home with wife with increased SOB and CP early this afternoon. States he has had a cough for a few weeks now, however CP and SOB came on suddenly this afternoon. Hx: CHF, lung ca s/p lobectomy left upper lobe, DM  324 aspirin given en route  EMS vitals... 187/90 78 sr 99% ra 18rr 188 cbg  Lung sounds rhonchus, wheezing on right side. Pt stable and not in distress at this time. CP currently 2/10 left chest non radiating.

## 2018-06-25 NOTE — ED Notes (Signed)
Pt ambulatory to and from hallway bathroom without difficulty, incr'd SOB, or need of assistance. Steady, even gait throughout.

## 2018-06-25 NOTE — ED Notes (Signed)
Pt sps. Weylin Plagge Ph # [phone]. Contact for status update

## 2018-06-25 NOTE — ED Provider Notes (Signed)
[facility] EMERGENCY DEPARTMENT Provider Note   CSN: [id number] Arrival date & time: 06/25/18  2058    History   Chief Complaint Chief Complaint  Patient presents with  . Shortness of Breath  . Chest Pain    HPI [name] is a 85 y.o. male.     HPI   85 year old male with chest pain.  Patient states that shortly after 7 PM he was walking his dog and felt fine. .  He subsequently came home and laid down on the couch.  When he was laying down he began having pain around his epigastric region.  Does not radiate., Pain is been constant since that time although it has progressively improved to the point of almost being resolved..  He has been coughing has some shortness of breath over the past several weeks, but denies any acute change in those symptoms.  He was not nauseated.  No diaphoresis.  Past Medical History:  Diagnosis Date  . Adenocarcinoma of left lung, stage 1 ([place]) 2001   T1N0 stage I  adenoca left lung resected 01/03/00   . Alzheimer's disease ([place])   . CAD (coronary artery disease) 2014   a. 09/2012 Cath: LM nl, LAD 50-60p, D1 60-70 m, D1 50ost, LCX nl, RCA min irregs, EF 55-65%.  . Generalized anxiety disorder 01/08/2007   Qualifier: Diagnosis of  By: Diona Browner MD, [name]    . History of colonic polyps 07/25/2011  . Macular degeneration of both eyes 01/21/2014  . Nonmelanoma skin cancer 07/25/2011   Multiple lesions excised face/nose  . Pericarditis    a. 09/2012 with effusion and tamponade, s/p window.  b.  F/u Echo 09/19/12: mod LVH, EF 55%, Gr 1 DD, Tr MR, mild RVE, no residual effusion  . Prostate CA ([place]) 04/2001   Gleason 7  S/P prostatectomy 04/11/01  . Squamous cell carcinoma of skin   . 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 Type 2 diabetes mellitus with vascular disease ([place]) 05/21/2007   Qualifier: Diagnosis of  By: Diona Browner MD, [name]    . Type 2 DM with CKD stage 3 and hypertension ([place]) 07/16/2015  . Unspecified essential hypertension      Patient Active Problem List   Diagnosis Date Noted  . Sensorineural hearing loss (SNHL) of both ears 05/15/2017  . Tinnitus, bilateral 05/15/2017  . Type 2 DM with CKD stage 3 and hypertension ([place]) 07/16/2015  . Macular degeneration of both eyes 01/21/2014  . 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 CAD (coronary artery disease)   . Pericardial tamponade 09/17/2012  . Acute pericarditis, unspecified 09/13/2012  . Adenocarcinoma of left lung, stage 1 ([place]) 07/25/2011  . Cancer of prostate w/med recur risk (T2b-c or Gleason 7 or PSA 10-20) ([place]) 07/25/2011  . Nonmelanoma skin cancer 07/25/2011  . History of colonic polyps 07/25/2011  . VERTIGO 07/22/2008  . Type 2 diabetes mellitus with vascular disease ([place]) 05/21/2007  . HYPERCHOLESTEROLEMIA 05/21/2007  . CARCINOMA, SKIN, SQUAMOUS CELL 01/08/2007  . Generalized anxiety disorder 01/08/2007  . ALZHEIMER'S DISEASE, MILD 01/08/2007  . Essential hypertension 01/08/2007    Past Surgical History:  Procedure Laterality Date  . HERNIA REPAIR    . LEFT HEART CATHETERIZATION WITH CORONARY ANGIOGRAM N/A 09/13/2012   Procedure: LEFT HEART CATHETERIZATION WITH CORONARY ANGIOGRAM;  Surgeon: Sherren Mocha, MD;  Location[facility] LAB;  Service: Cardiovascular;  Laterality: N/A;  . LOBECTOMY  2001   upper left  . PERICARDIAL TAP N/A 09/16/2012  Procedure: PERICARDIAL TAP;  Surgeon: Sinclair Grooms, MD;  Location: [facility] CATH LAB;  Service: Cardiovascular;  Laterality: N/A;  . PROSTATECTOMY  2003  . SUBXYPHOID PERICARDIAL WINDOW N/A 09/16/2012   Procedure: SUBXYPHOID PERICARDIAL WINDOW;  Surgeon: Rexene Alberts, MD;  Location: [facility] OR;  Service: Thoracic;  Laterality: N/A;  . TONSILLECTOMY          Home Medications    Prior to Admission medications   Medication Sig Start Date End Date Taking? Authorizing Provider  aspirin EC 81 MG tablet Take 81 mg by mouth daily.    [provider]  atorvastatin (LIPITOR) 40 MG tablet TAKE 1  TABLET BY MOUTH DAILY 07/04/17   Copland, Frederico Hamman, MD  donepezil (ARICEPT) 10 MG tablet TAKE 1 TABLET BY MOUTH EVERYDAY AT BEDTIME Patient taking differently: Take 10 mg by mouth at bedtime.  10/06/17   Copland, Frederico Hamman, MD  glipiZIDE (GLUCOTROL) 5 MG tablet TAKE 1 TABLET BY MOUTH EVERY DAY BEFORE BREAKFAST Patient taking differently: Take 5 mg by mouth daily before breakfast.  12/05/17   Copland, Frederico Hamman, MD  metFORMIN (GLUCOPHAGE-XR) 500 MG 24 hr tablet TAKE 1 TABLET BY MOUTH TWICE A DAY 04/06/18   Copland, Spencer, MD  Multiple Vitamins-Minerals (ICAPS LUTEIN & ZEAXANTHIN PO) Take 1 capsule by mouth 2 (two) times daily.     [provider]  Omega-3 Fatty Acids (FISH OIL) 1000 MG CAPS Take 1,000 mg by mouth at bedtime.    [provider]  sertraline (ZOLOFT) 50 MG tablet TAKE 1 TABLET BY MOUTH DAILY 09/20/17   Copland, Frederico Hamman, MD  triamcinolone cream (KENALOG) 0.1 % Apply 1 application topically 2 (two) times daily. 01/20/14   [name], MD    Family History Family History  Problem Relation Age of Onset  . Cancer Father        colon  . Dementia Mother   . Diabetes Mother   . Cancer Brother        lung    Social History Social History   Tobacco Use  . Smoking status: Former Smoker    Packs/day: 2.00    Years: 42.00    Pack years: 84.00    Types: Cigarettes    Start date: 02/25/1953    Last attempt to quit: 04/04/1984    Years since quitting: 34.2  . Smokeless tobacco: Former Systems developer    Types: Chew    Quit date: 07/27/1986  . Tobacco comment: quit tobacco 26 years ago  Substance Use Topics  . Alcohol use: No    Alcohol/week: 0.0 standard drinks  . Drug use: No     Allergies   Sulfa antibiotics   Review of Systems Review of Systems  All systems reviewed and negative, other than as noted in HPI.  Physical Exam Updated Vital Signs BP (!) 165/81 (BP Location: Right Arm)   Pulse 100   Temp 98.2 F (36.8 C) (Oral)   Resp 19   Ht 5\' 9"  (1.753 m)   Wt  86.2 kg   SpO2 96%   BMI 28.06 kg/m   Physical Exam Vitals signs and nursing note reviewed.  Constitutional:      General: He is not in acute distress.    Appearance: He is well-developed.  HENT:     Head: Normocephalic and atraumatic.  Eyes:     General:        Right eye: No discharge.        Left eye: No discharge.  Conjunctiva/sclera: Conjunctivae normal.  Neck:     Musculoskeletal: Neck supple.  Cardiovascular:     Rate and Rhythm: Normal rate and regular rhythm.     Heart sounds: Normal heart sounds. No murmur. No friction rub. No gallop.   Pulmonary:     Effort: Pulmonary effort is normal. No respiratory distress.     Breath sounds: Normal breath sounds.  Abdominal:     General: There is no distension.     Palpations: Abdomen is soft.     Tenderness: There is no abdominal tenderness.  Musculoskeletal:        General: No tenderness.  Skin:    General: Skin is warm and dry.  Neurological:     Mental Status: He is alert.  Psychiatric:        Behavior: Behavior normal.        Thought Content: Thought content normal.      ED Treatments / Results  Labs (all labs ordered are listed, but only abnormal results are displayed) Labs Reviewed  CBC WITH DIFFERENTIAL/PLATELET - Abnormal; Notable for the following components:      Result Value   WBC 11.6 (*)    Platelets 135 (*)    Neutro Abs 9.7 (*)    Lymphs Abs 0.5 (*)    Monocytes Absolute 1.3 (*)    All other components within normal limits  BASIC METABOLIC PANEL - Abnormal; Notable for the following components:   Glucose, Bld 185 (*)    Creatinine, Ser 1.29 (*)    GFR calc non Af Amer 50 (*)    GFR calc Af Amer 58 (*)    All other components within normal limits  TROPONIN I    EKG EKG Interpretation  Date/Time:  Monday June 25 2018 21:08:22 EDT Ventricular Rate:  82 PR Interval:    QRS Duration: 113 QT Interval:  403 QTC Calculation: 471 R Axis:   35 Text Interpretation:  Sinus rhythm  Borderline prolonged PR interval Borderline intraventricular conduction delay Baseline wander in lead(s) V2 Confirmed by Virgel Manifold [id number]) on 06/25/2018 9:32:42 PM   Radiology Dg Chest 2 View  Result Date: 06/25/2018 CLINICAL DATA:  85 year old male with chest pain and shortness of breath for 1 day. Previous left upper lobectomy. EXAM: CHEST - 2 VIEW COMPARISON:  06/08/2015 and earlier. FINDINGS: AP and lateral views. Stable decreased lung volume in the left hemithorax with surgical clips along the mediastinal contour and mild architectural distortion. Stable cardiac size and mediastinal contours. Visualized tracheal air column is within normal limits. The right lung appears stable and clear. No pneumothorax, pulmonary edema, pleural effusion or acute pulmonary opacity. No acute osseous abnormality identified. Negative visible bowel gas pattern. IMPRESSION: Stable postoperative appearance of the chest. No acute cardiopulmonary abnormality. Electronically Signed   By: Genevie Ann M.D.   On: 06/25/2018 22:26    Procedures Procedures (including critical care time)  Medications Ordered in ED Medications - No data to display   Initial Impression / Assessment and Plan / ED Course  I have reviewed the triage vital signs and the nursing notes.  Pertinent labs & imaging results that were available during my care of the patient were reviewed by me and considered in my medical decision making (see chart for details).  85 year old male with chest pain.  Seems atypical for ACS given the constant duration for a couple hours by the time I evaluated him.  He has had a progressive improvement of the symptoms now to the point  where he is essentially pain-free.  His EKG has no overt ischemic changes.  His initial troponin is normal.  Chest x-ray showing signs of prior lung surgery, but no acute abnormality.  He does have some wheezing on exam but denies any dyspnea and reports he has been coughing the past couple  weeks.  He does have a known history of CAD.  At this time, I have a relatively low suspicion for ACS, but feel it would be prudent to repeat a troponin.  I doubt PE, dissection or other emergent process.  Final Clinical Impressions(s) / ED Diagnoses   Final diagnoses:  Chest pain, unspecified type    ED Discharge Orders    None       Virgel Manifold, MD 06/26/18 [phone]

## 2018-06-26 ENCOUNTER — Telehealth: Payer: Self-pay

## 2018-06-26 DIAGNOSIS — I2510 Atherosclerotic heart disease of native coronary artery without angina pectoris: Secondary | ICD-10-CM | POA: Diagnosis not present

## 2018-06-26 DIAGNOSIS — I129 Hypertensive chronic kidney disease with stage 1 through stage 4 chronic kidney disease, or unspecified chronic kidney disease: Secondary | ICD-10-CM | POA: Diagnosis not present

## 2018-06-26 DIAGNOSIS — R079 Chest pain, unspecified: Secondary | ICD-10-CM | POA: Diagnosis not present

## 2018-06-26 DIAGNOSIS — G309 Alzheimer's disease, unspecified: Secondary | ICD-10-CM | POA: Diagnosis not present

## 2018-06-26 DIAGNOSIS — Z79899 Other long term (current) drug therapy: Secondary | ICD-10-CM | POA: Diagnosis not present

## 2018-06-26 DIAGNOSIS — E1122 Type 2 diabetes mellitus with diabetic chronic kidney disease: Secondary | ICD-10-CM | POA: Diagnosis not present

## 2018-06-26 DIAGNOSIS — Z7982 Long term (current) use of aspirin: Secondary | ICD-10-CM | POA: Diagnosis not present

## 2018-06-26 DIAGNOSIS — Z87891 Personal history of nicotine dependence: Secondary | ICD-10-CM | POA: Diagnosis not present

## 2018-06-26 DIAGNOSIS — Z7984 Long term (current) use of oral hypoglycemic drugs: Secondary | ICD-10-CM | POA: Diagnosis not present

## 2018-06-26 DIAGNOSIS — N183 Chronic kidney disease, stage 3 (moderate): Secondary | ICD-10-CM | POA: Diagnosis not present

## 2018-06-26 LAB — TROPONIN I: Troponin I: <0.03 ng/mL (ref <0.03)

## 2018-06-26 NOTE — ED Provider Notes (Signed)
Patient with history of CAD, previous pericardial effusion presents with chest pain.  I assumed care at signout to follow-up on labs.  Repeat troponin is negative.  EKG has been reviewed. He feels well and improved.  Patient is requesting discharge home.  I reviewed x-ray and his EKG. I advised patient that if he has any return of his chest pain or shortness of breath he should call 911.  However since patient is requesting discharge we will honor his wishes.   Ripley Fraise, MD 06/26/18 [phone]

## 2018-06-26 NOTE — ED Notes (Signed)
Reviewed d/c instructions with pt, who verbalized understanding and had no outstanding questions. [employer] & labels removed and placed in shred bin. Pt departed in NAD.

## 2018-06-26 NOTE — Telephone Encounter (Signed)
Attempted to reach patient on primary phone. Attempt successful.  Patient states he is feeling fine at this time. Denies SOB or chest pain.   Encouraged patient to seek treatment in ED if there is a change in health status. Patient verbalized understanding.

## 2018-06-26 NOTE — Telephone Encounter (Signed)
Patient seen in [facility]ED for shortness of breath and chest pain.   Per discharge summary:   Follow-Ups: Follow up with Copland, Frederico Hamman, MD (Family Medicine)   Attempted to reach patient. Attempt unsuccessful. Per spouse, patient is resting at this time. A future attempt will be made to reach patient.

## 2018-06-26 NOTE — Telephone Encounter (Signed)
reasonable 

## 2018-07-07 ENCOUNTER — Other Ambulatory Visit: Payer: Self-pay | Admitting: Family Medicine

## 2018-07-30 ENCOUNTER — Other Ambulatory Visit: Payer: Self-pay

## 2018-07-30 ENCOUNTER — Encounter (INDEPENDENT_AMBULATORY_CARE_PROVIDER_SITE_OTHER): Payer: Medicare HMO | Admitting: Ophthalmology

## 2018-07-30 DIAGNOSIS — H43813 Vitreous degeneration, bilateral: Secondary | ICD-10-CM

## 2018-07-30 DIAGNOSIS — H353231 Exudative age-related macular degeneration, bilateral, with active choroidal neovascularization: Secondary | ICD-10-CM

## 2018-08-14 ENCOUNTER — Other Ambulatory Visit: Payer: Self-pay | Admitting: *Deleted

## 2018-08-14 MED ORDER — ATORVASTATIN CALCIUM 40 MG PO TABS: 40.0000 mg | ORAL_TABLET | Freq: Every day | ORAL | 0 refills | Status: DC

## 2018-08-29 ENCOUNTER — Other Ambulatory Visit: Payer: Self-pay | Admitting: Family Medicine

## 2018-09-01 DIAGNOSIS — E119 Type 2 diabetes mellitus without complications: Secondary | ICD-10-CM | POA: Diagnosis not present

## 2018-09-03 ENCOUNTER — Ambulatory Visit (INDEPENDENT_AMBULATORY_CARE_PROVIDER_SITE_OTHER): Payer: Medicare HMO

## 2018-09-03 DIAGNOSIS — Z0000 Encounter for general adult medical examination without abnormal findings: Secondary | ICD-10-CM

## 2018-09-03 NOTE — Progress Notes (Signed)
PCP notes:   Health maintenance:  A1C - postponed Microalbumin - postponed Foot exam - postponed  Abnormal screenings:   None  Patient concerns:   None  Nurse concerns:  None  Next PCP appt:   11/07/18 @ 1040

## 2018-09-03 NOTE — Patient Instructions (Signed)
[name] , Thank you for taking time to come for your Medicare Wellness Visit. I appreciate your ongoing commitment to your health goals. Please review the following plan we discussed and let me know if I can assist you in the future.   These are the goals we discussed: Goals    . Increase physical activity     Starting 09/03/2018, I will continue to walk at least 30 minutes daily.        This is a list of the screening recommended for you and due dates:  Health Maintenance  Topic Date Due  . Hemoglobin A1C  10/31/2018*  . Urine Protein Check  10/31/2018*  . Complete foot exam   11/07/2018*  . Flu Shot  11/03/2018  . Eye exam for diabetics  06/04/2019  . Tetanus Vaccine  01/26/2023  . Pneumonia vaccines  Completed  *Topic was postponed. The date shown is not the original due date.   Preventive Care for Adults  A healthy lifestyle and preventive care can promote health and wellness. Preventive health guidelines for adults include the following key practices.  . A routine yearly physical is a good way to check with your health care provider about your health and preventive screening. It is a chance to share any concerns and updates on your health and to receive a thorough exam.  . Visit your dentist for a routine exam and preventive care every 6 months. Brush your teeth twice a day and floss once a day. Good oral hygiene prevents tooth decay and gum disease.  . The frequency of eye exams is based on your age, health, family medical history, use  of contact lenses, and other factors. Follow your health care provider's recommendations for frequency of eye exams.  . Eat a healthy diet. Foods like vegetables, fruits, whole grains, low-fat dairy products, and lean protein foods contain the nutrients you need without too many calories. Decrease your intake of foods high in solid fats, added sugars, and salt. Eat the right amount of calories for you. Get information about a proper diet  from your health care provider, if necessary.  . Regular physical exercise is one of the most important things you can do for your health. Most adults should get at least 150 minutes of moderate-intensity exercise (any activity that increases your heart rate and causes you to sweat) each week. In addition, most adults need muscle-strengthening exercises on 2 or more days a week.  Silver Sneakers may be a benefit available to you. To determine eligibility, you may visit the website: www.silversneakers.com or contact program at 201-787-3156 Mon-Fri between 8AM-8PM.   . Maintain a healthy weight. The body mass index (BMI) is a screening tool to identify possible weight problems. It provides an estimate of body fat based on height and weight. Your health care provider can find your BMI and can help you achieve or maintain a healthy weight.   For adults 20 years and older: ? A BMI below 18.5 is considered underweight. ? A BMI of 18.5 to 24.9 is normal. ? A BMI of 25 to 29.9 is considered overweight. ? A BMI of 30 and above is considered obese.   . Maintain normal blood lipids and cholesterol levels by exercising and minimizing your intake of saturated fat. Eat a balanced diet with plenty of fruit and vegetables. Blood tests for lipids and cholesterol should begin at age 56 and be repeated every 5 years. If your lipid or cholesterol levels are high, you  are over 26, or you are at high risk for heart disease, you may need your cholesterol levels checked more frequently. Ongoing high lipid and cholesterol levels should be treated with medicines if diet and exercise are not working.  . If you smoke, find out from your health care provider how to quit. If you do not use tobacco, please do not start.  . If you choose to drink alcohol, please do not consume more than 2 drinks per day. One drink is considered to be 12 ounces (355 mL) of beer, 5 ounces (148 mL) of wine, or 1.5 ounces (44 mL) of liquor.  .  If you are 34-5 years old, ask your health care provider if you should take aspirin to prevent strokes.  . Use sunscreen. Apply sunscreen liberally and repeatedly throughout the day. You should seek shade when your shadow is shorter than you. Protect yourself by wearing long sleeves, pants, a wide-brimmed hat, and sunglasses year round, whenever you are outdoors.  . Once a month, do a whole body skin exam, using a mirror to look at the skin on your back. Tell your health care provider of new moles, moles that have irregular borders, moles that are larger than a pencil eraser, or moles that have changed in shape or color.

## 2018-09-03 NOTE — Progress Notes (Signed)
Subjective:   [name] is a 85 y.o. male who presents for Medicare Annual/Subsequent preventive examination.  Review of Systems:  N/A Cardiac Risk Factors include: advanced age (>14men, >55 women);male gender;hypertension;diabetes mellitus;dyslipidemia     Objective:    Vitals: There were no vitals taken for this visit.  There is no height or weight on file to calculate BMI.  Advanced Directives 09/03/2018 06/25/2018 08/30/2017 08/15/2016 07/30/2015 07/31/2014 09/13/2012  Does Patient Have a Medical Advance Directive? Yes No Yes Yes Yes Yes Patient has advance directive, copy not in chart  Type of Advance Directive [place];Living will - [place];Living will [place];Living will [place];Living will - Living will  Does patient want to make changes to medical advance directive? - - - - No - Patient declined - -  Copy of [place] in Chart? No - copy requested - No - copy requested No - copy requested No - copy requested No - copy requested Copy requested from family  Would patient like information on creating a medical advance directive? - No - Patient declined - - - - -  Pre-existing out of facility DNR order (yellow form or pink MOST form) - - - - - - No    Tobacco Social History   Tobacco Use  Smoking Status Former Smoker  . Packs/day: 2.00  . Years: 42.00  . Pack years: 84.00  . Types: Cigarettes  . Start date: 02/25/1953  . Last attempt to quit: 04/04/1984  . Years since quitting: 34.4  Smokeless Tobacco Former Systems developer  . Types: Chew  . Quit date: 07/27/1986  Tobacco Comment   quit tobacco 26 years ago     Counseling given: No Comment: quit tobacco 26 years ago   Clinical Intake:  Pre-visit preparation completed: Yes  Pain : No/denies pain Pain Score: 0-No pain     Nutritional Status: BMI 25 -29 Overweight Nutritional Risks: None Diabetes: Yes CBG done?: Yes(219  per pt report) CBG resulted in Enter/ Edit results?: No Did pt. bring in CBG monitor from home?: No  How often do you need to have someone help you when you read instructions, pamphlets, or other written materials from your doctor or pharmacy?: 1 - Never What is the last grade level you completed in school?: 8th grade  Interpreter Needed?: No  Comments: pt lives with spouse Information entered by :: LPinson, LPN  Past Medical History:  Diagnosis Date  . Adenocarcinoma of left lung, stage 1 ([place]) 2001   T1N0 stage I  adenoca left lung resected 01/03/00   . Alzheimer's disease ([place])   . CAD (coronary artery disease) 2014   a. 09/2012 Cath: LM nl, LAD 50-60p, D1 60-70 m, D1 50ost, LCX nl, RCA min irregs, EF 55-65%.  . Generalized anxiety disorder 01/08/2007   Qualifier: Diagnosis of  By: Diona Browner MD, [name]    . History of colonic polyps 07/25/2011  . Macular degeneration of both eyes 01/21/2014  . Nonmelanoma skin cancer 07/25/2011   Multiple lesions excised face/nose  . Pericarditis    a. 09/2012 with effusion and tamponade, s/p window.  b.  F/u Echo 09/19/12: mod LVH, EF 55%, Gr 1 DD, Tr MR, mild RVE, no residual effusion  . Prostate CA ([place]) 04/2001   Gleason 7  S/P prostatectomy 04/11/01  . Squamous cell carcinoma of skin   . 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 Type 2 diabetes mellitus with vascular  disease ([place]) 05/21/2007   Qualifier: Diagnosis of  By: Diona Browner MD, [name]    . Type 2 DM with CKD stage 3 and hypertension ([place]) 07/16/2015  . Unspecified essential hypertension    Past Surgical History:  Procedure Laterality Date  . HERNIA REPAIR    . LEFT HEART CATHETERIZATION WITH CORONARY ANGIOGRAM N/A 09/13/2012   Procedure: LEFT HEART CATHETERIZATION WITH CORONARY ANGIOGRAM;  Surgeon: Sherren Mocha, MD;  Location: [facility] CATH LAB;  Service: Cardiovascular;  Laterality: N/A;  . LOBECTOMY  2001   upper left  . PERICARDIAL TAP N/A 09/16/2012   Procedure: PERICARDIAL TAP;  Surgeon: Sinclair Grooms, MD;  Location[facility] CATH LAB;  Service: Cardiovascular;  Laterality: N/A;  . PROSTATECTOMY  2003  . SUBXYPHOID PERICARDIAL WINDOW N/A 09/16/2012   Procedure: SUBXYPHOID PERICARDIAL WINDOW;  Surgeon: Rexene Alberts, MD;  Location[facility] OR;  Service: Thoracic;  Laterality: N/A;  . TONSILLECTOMY     Family History  Problem Relation Age of Onset  . Cancer Father        colon  . Dementia Mother   . Diabetes Mother   . Cancer Brother        lung   Social History   Socioeconomic History  . Marital status: Married    Spouse name: Not on file  . Number of children: Not on file  . Years of education: Not on file  . Highest education level: Not on file  Occupational History  . Occupation: retired  Scientific laboratory technician  . Financial resource strain: Not on file  . Food insecurity:    Worry: Not on file    Inability: Not on file  . Transportation needs:    Medical: Not on file    Non-medical: Not on file  Tobacco Use  . Smoking status: Former Smoker    Packs/day: 2.00    Years: 42.00    Pack years: 84.00    Types: Cigarettes    Start date: 02/25/1953    Last attempt to quit: 04/04/1984    Years since quitting: 34.4  . Smokeless tobacco: Former Systems developer    Types: Chew    Quit date: 07/27/1986  . Tobacco comment: quit tobacco 26 years ago  Substance and Sexual Activity  . Alcohol use: No    Alcohol/week: 0.0 standard drinks  . Drug use: No  . Sexual activity: Not Currently  Lifestyle  . Physical activity:    Days per week: Not on file    Minutes per session: Not on file  . Stress: Not on file  Relationships  . Social connections:    Talks on phone: Not on file    Gets together: Not on file    Attends religious service: Not on file    Active member of club or organization: Not on file    Attends meetings of clubs or organizations: Not on file    Relationship status: Not on file  Other Topics Concern  . Not on file  Social History Narrative   Lives in [place], [place]    Regular exercise--no, mowing grass   Diet: fruit and veggies    Outpatient Encounter Medications as of 09/03/2018  Medication Sig  . aspirin EC 81 MG tablet Take 81 mg by mouth daily.  Marland Kitchen atorvastatin (LIPITOR) 40 MG tablet TAKE 1 TABLET BY MOUTH EVERY DAY  . diphenhydrAMINE (BENADRYL) 25 MG tablet Take 25 mg by mouth 2 (two) times daily.  Marland Kitchen donepezil (ARICEPT) 10 MG tablet TAKE 1  TABLET BY MOUTH EVERYDAY AT BEDTIME  . glipiZIDE (GLUCOTROL) 5 MG tablet TAKE 1 TABLET BY MOUTH EVERY DAY BEFORE BREAKFAST  . metFORMIN (GLUCOPHAGE-XR) 500 MG 24 hr tablet TAKE 1 TABLET BY MOUTH TWICE A DAY  . Multiple Vitamins-Minerals (ICAPS LUTEIN & ZEAXANTHIN PO) Take 1 capsule by mouth 2 (two) times daily.   . Omega-3 Fatty Acids (FISH OIL) 1000 MG CAPS Take 1,000 mg by mouth at bedtime.  . sertraline (ZOLOFT) 50 MG tablet TAKE 1 TABLET BY MOUTH EVERY DAY  . triamcinolone cream (KENALOG) 0.1 % Apply 1 application topically 2 (two) times daily.   No facility-administered encounter medications on file as of 09/03/2018.     Activities of Daily Living In your present state of health, do you have any difficulty performing the following activities: 09/03/2018  Hearing? Y  Vision? 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 Y  Walking or climbing stairs? Y  Comment has only one lung  Dressing or bathing? N  Doing errands, shopping? Y  Comment limited driving  Preparing Food and eating ? N  Comment limited cooking  Using the Toilet? N  In the past six months, have you accidently leaked urine? N  Do you have problems with loss of bowel control? N  Managing your Medications? N  Managing your Finances? Y  Housekeeping or managing your Housekeeping? Y  Some recent data might be hidden    Patient Care Team: [name], MD as PCP - General (Family Medicine) Sherren Mocha, MD as PCP - Cardiology (Cardiology)   Assessment:   This is a routine  wellness examination for Micholas.  Hearing Screening Comments: Bilateral hearing aids Vision Screening Comments: Vision exam in 2019 with Dr. Herbert Deaner; eye injections with Dr. Zigmund Daniel every 3 mths  Exercise Activities and Dietary recommendations Current Exercise Habits: Home exercise routine, Type of exercise: walking, Time (Minutes): 30, Frequency (Times/Week): 7, Weekly Exercise (Minutes/Week): 210, Intensity: Mild, Exercise limited by: None identified  Goals    . Increase physical activity     Starting 09/03/2018, I will continue to walk at least 30 minutes daily.        Fall Risk Fall Risk  09/03/2018 08/30/2017 08/15/2016 01/15/2015 07/31/2014  Falls in the past year? 0 No No No No    Depression Screen PHQ 2/9 Scores 09/03/2018 08/30/2017 08/15/2016 01/15/2015  PHQ - 2 Score 0 0 0 0  PHQ- 9 Score 0 0 - -    Cognitive Function MMSE - Mini Mental State Exam 09/03/2018 08/30/2017 08/15/2016  Not completed: (No Data) (No Data) -  Orientation to time - - 5  Orientation to Place - - 5  Registration - - 3  Attention/ Calculation - - 0  Recall - - 3  Language- name 2 objects - - 0  Language- repeat - - 1  Language- follow 3 step command - - 3  Language- read & follow direction - - 0  Write a sentence - - 0  Copy design - - 0  Total score - - 20    DX: Alzheimer's disease. Mini-Cog was not completed.    Immunization History  Administered Date(s) Administered  . Influenza Whole 01/03/2007  . Influenza, High Dose Seasonal PF 01/19/2018  . Influenza,inj,Quad PF,6+ Mos 02/06/2013, 01/20/2014, 01/15/2015, 01/29/2016  . Influenza-Unspecified 01/02/2017  . Pneumococcal Conjugate-13 01/20/2014  . Pneumococcal Polysaccharide-23 01/02/2001  . Td 04/04/1994  . Tdap 01/25/2013    Screening Tests Health Maintenance  Topic Date Due  . HEMOGLOBIN A1C  10/31/2018 (Originally 03/02/2018)  . URINE MICROALBUMIN  10/31/2018 (Originally 08/31/2018)  . FOOT EXAM  11/07/2018 (Originally 01/18/2016)   . INFLUENZA VACCINE  11/03/2018  . OPHTHALMOLOGY EXAM  06/04/2019  . TETANUS/TDAP  01/26/2023  . PNA vac Low Risk Adult  Completed     Plan:     I have personally reviewed, addressed, and noted the following in the patient's chart:  A. Medical and social history B. Use of alcohol, tobacco or illicit drugs  C. Current medications and supplements D. Functional ability and status E.  Nutritional status F.  Physical activity G. Advance directives H. List of other physicians I.  Hospitalizations, surgeries, and ER visits in previous 12 months J.  Vitals (unless it is a telemedicine encounter) K. Screenings to include cognitive, depression, hearing, vision (NOTE: hearing and vision screenings not completed in telemedicine encounter) L. Referrals and appointments   In addition, I have reviewed and discussed with patient certain preventive protocols, quality metrics, and best practice recommendations. A written personalized care plan for preventive services and recommendations were provided to patient.  With patient's permission, we connected on 09/03/18 at 10:30 AM EDT. Interactive audio and video telecommunications were attempted with patient. This attempt was unsuccessful due to patient having technical difficulties OR patient did not have access to video capability.  Encounter was completed with audio only.  Two patient identifiers were used to ensure the encounter occurred with the correct person. Patient was in home and writer was in office.   Signed,   Lindell Noe, MHA, BS, LPN Health Coach

## 2018-09-03 NOTE — Progress Notes (Signed)
I reviewed health advisor's note, was available for consultation, and agree with documentation and plan.   Signed,  Panda Crossin T. Aryam Zhan, MD  

## 2018-09-05 ENCOUNTER — Encounter: Payer: Medicare HMO | Admitting: Family Medicine

## 2018-09-24 ENCOUNTER — Other Ambulatory Visit: Payer: Self-pay

## 2018-09-24 ENCOUNTER — Encounter (INDEPENDENT_AMBULATORY_CARE_PROVIDER_SITE_OTHER): Payer: Medicare HMO | Admitting: Ophthalmology

## 2018-09-24 DIAGNOSIS — H43813 Vitreous degeneration, bilateral: Secondary | ICD-10-CM | POA: Diagnosis not present

## 2018-09-24 DIAGNOSIS — H353231 Exudative age-related macular degeneration, bilateral, with active choroidal neovascularization: Secondary | ICD-10-CM | POA: Diagnosis not present

## 2018-10-31 ENCOUNTER — Other Ambulatory Visit (INDEPENDENT_AMBULATORY_CARE_PROVIDER_SITE_OTHER): Payer: Medicare HMO

## 2018-10-31 DIAGNOSIS — Z79899 Other long term (current) drug therapy: Secondary | ICD-10-CM

## 2018-10-31 DIAGNOSIS — E7800 Pure hypercholesterolemia, unspecified: Secondary | ICD-10-CM

## 2018-10-31 DIAGNOSIS — E1159 Type 2 diabetes mellitus with other circulatory complications: Secondary | ICD-10-CM | POA: Diagnosis not present

## 2018-10-31 DIAGNOSIS — Z8546 Personal history of malignant neoplasm of prostate: Secondary | ICD-10-CM

## 2018-10-31 LAB — BASIC METABOLIC PANEL
BUN: 13 mg/dL (ref 6–23)
CO2: 28 mEq/L (ref 19–32)
Calcium: 9 mg/dL (ref 8.4–10.5)
Chloride: 104 mEq/L (ref 96–112)
Creatinine, Ser: 1.28 mg/dL (ref 0.40–1.50)
GFR: 53.28 mL/min — ABNORMAL LOW (ref >60.00)
Glucose, Bld: 222 mg/dL — ABNORMAL HIGH (ref 70–99)
Potassium: 5 mEq/L (ref 3.5–5.1)
Sodium: 138 mEq/L (ref 135–145)

## 2018-10-31 LAB — CBC WITH DIFFERENTIAL/PLATELET
Basophils Absolute: 0 10*3/uL (ref 0.0–0.1)
Basophils Relative: 0.5 % (ref 0.0–3.0)
Eosinophils Absolute: 0.1 10*3/uL (ref 0.0–0.7)
Eosinophils Relative: 2 % (ref 0.0–5.0)
HCT: 37.8 % — ABNORMAL LOW (ref 39.0–52.0)
Hemoglobin: 12.5 g/dL — ABNORMAL LOW (ref 13.0–17.0)
Lymphocytes Relative: 14.2 % (ref 12.0–46.0)
Lymphs Abs: 0.9 10*3/uL (ref 0.7–4.0)
MCHC: 33 g/dL (ref 30.0–36.0)
MCV: 99.5 fl (ref 78.0–100.0)
Monocytes Absolute: 0.9 10*3/uL (ref 0.1–1.0)
Monocytes Relative: 15.3 % — ABNORMAL HIGH (ref 3.0–12.0)
Neutro Abs: 4.1 10*3/uL (ref 1.4–7.7)
Neutrophils Relative %: 68 % (ref 43.0–77.0)
Platelets: 151 10*3/uL (ref 150.0–400.0)
RBC: 3.8 Mil/uL — ABNORMAL LOW (ref 4.22–5.81)
RDW: 13.4 % (ref 11.5–15.5)
WBC: 6.1 10*3/uL (ref 4.0–10.5)

## 2018-10-31 LAB — HEPATIC FUNCTION PANEL
ALT: 9 U/L (ref 0–53)
AST: 10 U/L (ref 0–37)
Albumin: 4.2 g/dL (ref 3.5–5.2)
Alkaline Phosphatase: 75 U/L (ref 39–117)
Bilirubin, Direct: 0.1 mg/dL (ref 0.0–0.3)
Total Bilirubin: 0.4 mg/dL (ref 0.2–1.2)
Total Protein: 6.3 g/dL (ref 6.0–8.3)

## 2018-10-31 LAB — LIPID PANEL
Cholesterol: 121 mg/dL (ref 0–200)
HDL: 35.6 mg/dL — ABNORMAL LOW (ref >39.00)
LDL Cholesterol: 52 mg/dL (ref 0–99)
NonHDL: 85.59
Total CHOL/HDL Ratio: 3
Triglycerides: 170 mg/dL — ABNORMAL HIGH (ref 0.0–149.0)
VLDL: 34 mg/dL (ref 0.0–40.0)

## 2018-10-31 LAB — HEMOGLOBIN A1C: Hgb A1c MFr Bld: 6.9 % — ABNORMAL HIGH (ref 4.6–6.5)

## 2018-10-31 LAB — MICROALBUMIN / CREATININE URINE RATIO
Creatinine,U: 390.9 mg/dL
Microalb Creat Ratio: 0.9 mg/g (ref 0.0–30.0)
Microalb, Ur: 3.4 mg/dL — ABNORMAL HIGH (ref 0.0–1.9)

## 2018-11-01 LAB — PSA, TOTAL WITH REFLEX TO PSA, FREE: PSA, Total: <0.1 ng/mL (ref <=4.0)

## 2018-11-07 ENCOUNTER — Encounter: Payer: Self-pay | Admitting: Family Medicine

## 2018-11-07 ENCOUNTER — Other Ambulatory Visit: Payer: Self-pay

## 2018-11-07 ENCOUNTER — Ambulatory Visit (INDEPENDENT_AMBULATORY_CARE_PROVIDER_SITE_OTHER): Payer: Medicare HMO | Admitting: Family Medicine

## 2018-11-07 VITALS — BP 140/74 | HR 66 | Temp 98.5°F | Ht 69.0 in | Wt 186.2 lb

## 2018-11-07 DIAGNOSIS — Z0000 Encounter for general adult medical examination without abnormal findings: Secondary | ICD-10-CM | POA: Diagnosis not present

## 2018-11-07 NOTE — Progress Notes (Signed)
Lionel Woodberry T. Abelina Ketron, MD Primary Care and [place] at [facility] [place], [address] Phone: [phone]  FAX: [phone]  KYNAN PEASLEY - 85 y.o. male  MRN [id number]  Date of Birth: 1930/05/23  Visit Date: 11/07/2018  PCP: [name], MD  Referred by: [name], MD  Chief Complaint  Patient presents with  . Annual Exam    Part 2   Patient Care Team: [name], MD as PCP - General (Family Medicine) Sherren Mocha, MD as PCP - Cardiology (Cardiology) Subjective:   [name] is a 85 y.o. pleasant patient who presents with the following:  Preventative Health Maintenance Visit:  Health Maintenance Summary Reviewed and updated, unless pt declines services.  Tobacco History Reviewed. Alcohol: No concerns, no excessive use Exercise Habits: minimal activity STD concerns: no risk or activity to increase risk Drug Use: None Encouraged self-testicular check  R lower leg with bug bte  Health Maintenance  Topic Date Due  . INFLUENZA VACCINE  11/03/2018  . FOOT EXAM  11/07/2018 (Originally 01/18/2016)  . HEMOGLOBIN A1C  05/03/2019  . OPHTHALMOLOGY EXAM  06/04/2019  . URINE MICROALBUMIN  10/31/2019  . TETANUS/TDAP  01/26/2023  . PNA vac Low Risk Adult  Completed   Immunization History  Administered Date(s) Administered  . Influenza Whole 01/03/2007  . Influenza, High Dose Seasonal PF 01/19/2018  . Influenza,inj,Quad PF,6+ Mos 02/06/2013, 01/20/2014, 01/15/2015, 01/29/2016  . Influenza-Unspecified 01/02/2017  . Pneumococcal Conjugate-13 01/20/2014  . Pneumococcal Polysaccharide-23 01/02/2001  . Td 04/04/1994  . Tdap 01/25/2013   Patient Active Problem List   Diagnosis Date Noted  . CAD (coronary artery disease)     Priority: High  . Adenocarcinoma of left lung, stage 1 ([place]) 07/25/2011    Priority: High  . Cancer of prostate w/med recur risk (T2b-c or Gleason 7 or PSA 10-20)  ([place]) 07/25/2011    Priority: High  . Type 2 diabetes mellitus with vascular disease ([place]) 05/21/2007    Priority: High  . Pericardial tamponade 09/17/2012    Priority: Medium  . Acute pericarditis, unspecified 09/13/2012    Priority: Medium  . HYPERCHOLESTEROLEMIA 05/21/2007    Priority: Medium  . Essential hypertension 01/08/2007    Priority: Medium  . Sensorineural hearing loss (SNHL) of both ears 05/15/2017  . Tinnitus, bilateral 05/15/2017  . Type 2 DM with CKD stage 3 and hypertension ([place]) 07/16/2015  . Macular degeneration of both eyes 01/21/2014  . 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 Nonmelanoma skin cancer 07/25/2011  . History of colonic polyps 07/25/2011  . VERTIGO 07/22/2008  . CARCINOMA, SKIN, SQUAMOUS CELL 01/08/2007  . Generalized anxiety disorder 01/08/2007  . ALZHEIMER'S DISEASE, MILD 01/08/2007   Past Medical History:  Diagnosis Date  . Adenocarcinoma of left lung, stage 1 ([place]) 2001   T1N0 stage I  adenoca left lung resected 01/03/00   . Alzheimer's disease ([place])   . CAD (coronary artery disease) 2014   a. 09/2012 Cath: LM nl, LAD 50-60p, D1 60-70 m, D1 50ost, LCX nl, RCA min irregs, EF 55-65%.  . Generalized anxiety disorder 01/08/2007   Qualifier: Diagnosis of  By: Diona Browner MD, [name]    . History of colonic polyps 07/25/2011  . Macular degeneration of both eyes 01/21/2014  . Nonmelanoma skin cancer 07/25/2011   Multiple lesions excised face/nose  . Pericarditis    a. 09/2012 with effusion and tamponade, s/p window.  b.  F/u Echo 09/19/12: mod LVH,  EF 55%, Gr 1 DD, Tr MR, mild RVE, no residual effusion  . Prostate CA ([place]) 04/2001   Gleason 7  S/P prostatectomy 04/11/01  . SCC (squamous cell carcinoma) Well Diff 09/13/2016   Tip of Nose (MOH's)  . SCC (squamous cell carcinoma) Well Diff 05/30/2017   Right Cheekbone (Cx3,5FU) and Under Left Eye (Cx3,5FU)  . Squamous cell carcinoma in situ (SCCIS) 04/12/2005   Right Neck Inf (Cx3), Right Neck Sup (Exc), and  Bulb of Nose (Cx3,Exc)  . Squamous cell carcinoma in situ (SCCIS) 09/27/2005   Nose (Cx3,Exc)  . Squamous cell carcinoma in situ (SCCIS) 02/18/2013   Below Left Eye (tx p bx)  . Squamous cell carcinoma in situ (SCCIS) 04/16/2013   Left Temple (Cx3,5FU), Below Left Eye (Cx3,5FU), Below Right Eye (Cx3,5FU)  . Squamous cell carcinoma in situ (SCCIS) 08/04/2014   Left Cheek (Cx3,5FU)  . Squamous cell carcinoma in situ (SCCIS) 09/13/2016   Right Inner Eye Sup. ([place])  . Squamous cell carcinoma in situ (SCCIS) Focal 08/24/2008   Right Outer Brow (tx p bx) and Below Left Eye (Cx3,5FU)  . Squamous cell carcinoma in situ (SCCIS) Hypertrophic 10/25/2006   Right Elbow (Cx3,5FU)  . Squamous cell carcinoma of skin   . 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 Type 2 diabetes mellitus with vascular disease ([place]) 05/21/2007   Qualifier: Diagnosis of  By: Diona Browner MD, [name]    . Type 2 DM with CKD stage 3 and hypertension ([place]) 07/16/2015  . Unspecified essential hypertension    Past Surgical History:  Procedure Laterality Date  . HERNIA REPAIR    . LEFT HEART CATHETERIZATION WITH CORONARY ANGIOGRAM N/A 09/13/2012   Procedure: LEFT HEART CATHETERIZATION WITH CORONARY ANGIOGRAM;  Surgeon: Sherren Mocha, MD;  Location: [facility] CATH LAB;  Service: Cardiovascular;  Laterality: N/A;  . LOBECTOMY  2001   upper left  . PERICARDIAL TAP N/A 09/16/2012   Procedure: PERICARDIAL TAP;  Surgeon: Sinclair Grooms, MD;  Location: [facility] CATH LAB;  Service: Cardiovascular;  Laterality: N/A;  . PROSTATECTOMY  2003  . SUBXYPHOID PERICARDIAL WINDOW N/A 09/16/2012   Procedure: SUBXYPHOID PERICARDIAL WINDOW;  Surgeon: Rexene Alberts, MD;  Location: [facility] OR;  Service: Thoracic;  Laterality: N/A;  . TONSILLECTOMY     Social History   Socioeconomic History  . Marital status: Married    Spouse name: Not on file  . Number of children: Not on file  . Years of education: Not on file  . Highest education level: Not on file   Occupational History  . Occupation: retired  Scientific laboratory technician  . Financial resource strain: Not on file  . Food insecurity    Worry: Not on file    Inability: Not on file  . Transportation needs    Medical: Not on file    Non-medical: Not on file  Tobacco Use  . Smoking status: Former Smoker    Packs/day: 2.00    Years: 42.00    Pack years: 84.00    Types: Cigarettes    Start date: 02/25/1953    Quit date: 04/04/1984    Years since quitting: 34.6  . Smokeless tobacco: Former Systems developer    Types: Chew    Quit date: 07/27/1986  . Tobacco comment: quit tobacco 26 years ago  Substance and Sexual Activity  . Alcohol use: No    Alcohol/week: 0.0 standard drinks  . Drug use: No  . Sexual activity: Not Currently  Lifestyle  . Physical activity  Days per week: Not on file    Minutes per session: Not on file  . Stress: Not on file  Relationships  . Social Herbalist on phone: Not on file    Gets together: Not on file    Attends religious service: Not on file    Active member of club or organization: Not on file    Attends meetings of clubs or organizations: Not on file    Relationship status: Not on file  . Intimate partner violence    Fear of current or ex partner: Not on file    Emotionally abused: Not on file    Physically abused: Not on file    Forced sexual activity: Not on file  Other Topics Concern  . Not on file  Social History Narrative   Lives in [place], [place]   Regular exercise--no, mowing grass   Diet: fruit and veggies   Family History  Problem Relation Age of Onset  . Cancer Father        colon  . Dementia Mother   . Diabetes Mother   . Cancer Brother        lung   Allergies  Allergen Reactions  . Sulfa Antibiotics Nausea Only    Medication list has been reviewed and updated.   General: Denies fever, chills, sweats. No significant weight loss. Eyes: Denies blurring,significant itching ENT: Denies earache, sore throat, and hoarseness.  Cardiovascular: Denies chest pains, palpitations, dyspnea on exertion Respiratory: Denies cough, dyspnea at rest,wheeezing Breast: no concerns about lumps GI: Denies nausea, vomiting, diarrhea, constipation, change in bowel habits, abdominal pain, melena, hematochezia GU: Denies penile discharge, ED, urinary flow / outflow problems. No STD concerns. Musculoskeletal: Denies back pain, joint pain Derm: Denies rash, itching Neuro: Denies  paresthesias, frequent falls, frequent headaches Psych: Denies depression, anxiety Endocrine: Denies cold intolerance, heat intolerance, polydipsia Heme: Denies enlarged lymph nodes Allergy: No hayfever  Objective:   BP 140/74   Pulse 66   Temp 98.5 F (36.9 C) (Temporal)   Ht 5\' 9"  (1.753 m)   Wt 186 lb 4 oz (84.5 kg)   SpO2 98%   BMI 27.50 kg/m  Ideal Body Weight: Weight in (lb) to have BMI = 25: 168.9  Ideal Body Weight: Weight in (lb) to have BMI = 25: 168.9 No exam data present Depression screen [facility] 2/9 09/03/2018 08/30/2017 08/15/2016 01/15/2015  Decreased Interest 0 0 0 0  Down, Depressed, Hopeless 0 0 0 0  PHQ - 2 Score 0 0 0 0  Altered sleeping 0 0 - -  Tired, decreased energy 0 0 - -  Change in appetite 0 0 - -  Feeling bad or failure about yourself  0 0 - -  Trouble concentrating 0 0 - -  Moving slowly or fidgety/restless 0 0 - -  Suicidal thoughts 0 0 - -  PHQ-9 Score 0 0 - -  Difficult doing work/chores Not difficult at all Not difficult at all - -     GEN: well developed, well nourished, no acute distress Eyes: conjunctiva and lids normal, PERRLA, EOMI ENT: TM clear, nares clear, oral exam WNL Neck: supple, no lymphadenopathy, no thyromegaly, no JVD Pulm: clear to auscultation and percussion, respiratory effort normal CV: regular rate and rhythm, S1-S2, no murmur, rub or gallop, no bruits, peripheral pulses normal and symmetric, no cyanosis, clubbing, edema or varicosities GI: soft, non-tender; no hepatosplenomegaly, masses;  active bowel sounds all quadrants GU: no hernia, testicular mass, penile  discharge Lymph: no cervical, axillary or inguinal adenopathy MSK: gait normal, muscle tone and strength WNL, no joint swelling, effusions, discoloration, crepitus  SKIN: 3 probable AK's on head Neuro: normal mental status, normal strength, sensation, and motion Psych: alert; oriented to person, place and time, normally interactive and not anxious or depressed in appearance.  All labs reviewed with patient. Results for orders placed or performed in visit on 10/31/18  Hepatic function panel  Result Value Ref Range   Total Bilirubin 0.4 0.2 - 1.2 mg/dL   Bilirubin, Direct 0.1 0.0 - 0.3 mg/dL   Alkaline Phosphatase 75 39 - 117 U/L   AST 10 0 - 37 U/L   ALT 9 0 - 53 U/L   Total Protein 6.3 6.0 - 8.3 g/dL   Albumin 4.2 3.5 - 5.2 g/dL  Basic metabolic panel  Result Value Ref Range   Sodium 138 135 - 145 mEq/L   Potassium 5.0 3.5 - 5.1 mEq/L   Chloride 104 96 - 112 mEq/L   CO2 28 19 - 32 mEq/L   Glucose, Bld 222 (H) 70 - 99 mg/dL   BUN 13 6 - 23 mg/dL   Creatinine, Ser 1.28 0.40 - 1.50 mg/dL   Calcium 9.0 8.4 - 10.5 mg/dL   GFR 53.28 (L) >60.00 mL/min  CBC with Differential/Platelet  Result Value Ref Range   WBC 6.1 4.0 - 10.5 K/uL   RBC 3.80 (L) 4.22 - 5.81 Mil/uL   Hemoglobin 12.5 (L) 13.0 - 17.0 g/dL   HCT 37.8 (L) 39.0 - 52.0 %   MCV 99.5 78.0 - 100.0 fl   MCHC 33.0 30.0 - 36.0 g/dL   RDW 13.4 11.5 - 15.5 %   Platelets 151.0 150.0 - 400.0 K/uL   Neutrophils Relative % 68.0 43.0 - 77.0 %   Lymphocytes Relative 14.2 12.0 - 46.0 %   Monocytes Relative 15.3 (H) 3.0 - 12.0 %   Eosinophils Relative 2.0 0.0 - 5.0 %   Basophils Relative 0.5 0.0 - 3.0 %   Neutro Abs 4.1 1.4 - 7.7 K/uL   Lymphs Abs 0.9 0.7 - 4.0 K/uL   Monocytes Absolute 0.9 0.1 - 1.0 K/uL   Eosinophils Absolute 0.1 0.0 - 0.7 K/uL   Basophils Absolute 0.0 0.0 - 0.1 K/uL  PSA, Total with Reflex to PSA, Free  Result Value Ref Range   PSA,  Total <0.1 < OR = 4.0 ng/mL  Microalbumin / creatinine urine ratio  Result Value Ref Range   Microalb, Ur 3.4 (H) 0.0 - 1.9 mg/dL   Creatinine,U 390.9 mg/dL   Microalb Creat Ratio 0.9 0.0 - 30.0 mg/g  Hemoglobin A1c  Result Value Ref Range   Hgb A1c MFr Bld 6.9 (H) 4.6 - 6.5 %  Lipid panel  Result Value Ref Range   Cholesterol 121 0 - 200 mg/dL   Triglycerides 170.0 (H) 0.0 - 149.0 mg/dL   HDL 35.60 (L) >39.00 mg/dL   VLDL 34.0 0.0 - 40.0 mg/dL   LDL Cholesterol 52 0 - 99 mg/dL   Total CHOL/HDL Ratio 3    NonHDL 85.59     Assessment and Plan:     ICD-10-CM   1. Healthcare maintenance  Z00.00    AK? On head - they will f/u with their dermatologist O/w doing pretty well at 106  Health Maintenance Exam: The patient's preventative maintenance and recommended screening tests for an annual wellness exam were reviewed in full today. Brought up to date unless services declined.  Counselled  on the importance of diet, exercise, and its role in overall health and mortality. The patient's FH and SH was reviewed, including their home life, tobacco status, and drug and alcohol status.  Follow-up in 1 year for physical exam or additional follow-up below.  Follow-up: No follow-ups on file. Or follow-up in 1 year if not noted.  No orders of the defined types were placed in this encounter.  There are no discontinued medications. No orders of the defined types were placed in this encounter.   Signed,  [name]. Ahlani Wickes, MD   Allergies as of 11/07/2018      Reactions   Sulfa Antibiotics Nausea Only      Medication List       Accurate as of November 07, 2018  1:45 PM. If you have any questions, ask your nurse or doctor.        aspirin EC 81 MG tablet Take 81 mg by mouth daily.   atorvastatin 40 MG tablet Commonly known as: LIPITOR TAKE 1 TABLET BY MOUTH EVERY DAY   diphenhydrAMINE 25 MG tablet Commonly known as: BENADRYL Take 25 mg by mouth 2 (two) times daily.    donepezil 10 MG tablet Commonly known as: ARICEPT TAKE 1 TABLET BY MOUTH EVERYDAY AT BEDTIME   Fish Oil 1000 MG Caps Take 1,000 mg by mouth at bedtime.   glipiZIDE 5 MG tablet Commonly known as: GLUCOTROL TAKE 1 TABLET BY MOUTH EVERY DAY BEFORE BREAKFAST   ICAPS LUTEIN & ZEAXANTHIN PO Take 1 capsule by mouth 2 (two) times daily.   metFORMIN 500 MG 24 hr tablet Commonly known as: GLUCOPHAGE-XR TAKE 1 TABLET BY MOUTH TWICE A DAY   sertraline 50 MG tablet Commonly known as: ZOLOFT TAKE 1 TABLET BY MOUTH EVERY DAY   triamcinolone cream 0.1 % Commonly known as: KENALOG Apply 1 application topically 2 (two) times daily.

## 2018-11-26 ENCOUNTER — Encounter (INDEPENDENT_AMBULATORY_CARE_PROVIDER_SITE_OTHER): Payer: Medicare HMO | Admitting: Ophthalmology

## 2018-11-26 ENCOUNTER — Other Ambulatory Visit: Payer: Self-pay

## 2018-11-26 DIAGNOSIS — E11311 Type 2 diabetes mellitus with unspecified diabetic retinopathy with macular edema: Secondary | ICD-10-CM

## 2018-11-26 DIAGNOSIS — E113392 Type 2 diabetes mellitus with moderate nonproliferative diabetic retinopathy without macular edema, left eye: Secondary | ICD-10-CM

## 2018-11-26 DIAGNOSIS — E113311 Type 2 diabetes mellitus with moderate nonproliferative diabetic retinopathy with macular edema, right eye: Secondary | ICD-10-CM

## 2018-11-26 DIAGNOSIS — H43813 Vitreous degeneration, bilateral: Secondary | ICD-10-CM

## 2018-11-26 DIAGNOSIS — H353231 Exudative age-related macular degeneration, bilateral, with active choroidal neovascularization: Secondary | ICD-10-CM

## 2018-11-27 ENCOUNTER — Other Ambulatory Visit: Payer: Self-pay | Admitting: Dermatology

## 2018-11-27 DIAGNOSIS — D0439 Carcinoma in situ of skin of other parts of face: Secondary | ICD-10-CM | POA: Diagnosis not present

## 2018-11-27 DIAGNOSIS — D485 Neoplasm of uncertain behavior of skin: Secondary | ICD-10-CM | POA: Diagnosis not present

## 2018-11-27 DIAGNOSIS — L570 Actinic keratosis: Secondary | ICD-10-CM | POA: Diagnosis not present

## 2018-11-27 DIAGNOSIS — L820 Inflamed seborrheic keratosis: Secondary | ICD-10-CM | POA: Diagnosis not present

## 2018-12-01 DIAGNOSIS — E119 Type 2 diabetes mellitus without complications: Secondary | ICD-10-CM | POA: Diagnosis not present

## 2019-01-28 ENCOUNTER — Encounter (INDEPENDENT_AMBULATORY_CARE_PROVIDER_SITE_OTHER): Payer: Medicare HMO | Admitting: Ophthalmology

## 2019-01-29 ENCOUNTER — Other Ambulatory Visit: Payer: Self-pay | Admitting: Family Medicine

## 2019-02-11 ENCOUNTER — Other Ambulatory Visit: Payer: Self-pay

## 2019-02-11 DIAGNOSIS — Z20822 Contact with and (suspected) exposure to covid-19: Secondary | ICD-10-CM

## 2019-02-12 LAB — NOVEL CORONAVIRUS, NAA: SARS-CoV-2, NAA: DETECTED — AB

## 2019-02-13 ENCOUNTER — Encounter (INDEPENDENT_AMBULATORY_CARE_PROVIDER_SITE_OTHER): Payer: Medicare HMO | Admitting: Ophthalmology

## 2019-02-15 ENCOUNTER — Telehealth: Payer: Self-pay

## 2019-02-15 NOTE — Telephone Encounter (Signed)
Spoke with patient's wife, patient is doing well. Not having any symptoms of concern at this time.

## 2019-03-01 DIAGNOSIS — H353231 Exudative age-related macular degeneration, bilateral, with active choroidal neovascularization: Secondary | ICD-10-CM | POA: Diagnosis not present

## 2019-03-01 DIAGNOSIS — H40013 Open angle with borderline findings, low risk, bilateral: Secondary | ICD-10-CM | POA: Diagnosis not present

## 2019-03-01 DIAGNOSIS — H52223 Regular astigmatism, bilateral: Secondary | ICD-10-CM | POA: Diagnosis not present

## 2019-03-01 DIAGNOSIS — H524 Presbyopia: Secondary | ICD-10-CM | POA: Diagnosis not present

## 2019-03-02 DIAGNOSIS — E119 Type 2 diabetes mellitus without complications: Secondary | ICD-10-CM | POA: Diagnosis not present

## 2019-03-06 DIAGNOSIS — R69 Illness, unspecified: Secondary | ICD-10-CM | POA: Diagnosis not present

## 2019-03-15 DIAGNOSIS — H35051 Retinal neovascularization, unspecified, right eye: Secondary | ICD-10-CM | POA: Diagnosis not present

## 2019-03-15 DIAGNOSIS — E113291 Type 2 diabetes mellitus with mild nonproliferative diabetic retinopathy without macular edema, right eye: Secondary | ICD-10-CM | POA: Diagnosis not present

## 2019-03-15 DIAGNOSIS — H524 Presbyopia: Secondary | ICD-10-CM | POA: Diagnosis not present

## 2019-03-15 DIAGNOSIS — H353231 Exudative age-related macular degeneration, bilateral, with active choroidal neovascularization: Secondary | ICD-10-CM | POA: Diagnosis not present

## 2019-03-27 ENCOUNTER — Other Ambulatory Visit: Payer: Self-pay

## 2019-03-27 ENCOUNTER — Encounter (INDEPENDENT_AMBULATORY_CARE_PROVIDER_SITE_OTHER): Payer: Medicare HMO | Admitting: Ophthalmology

## 2019-03-27 DIAGNOSIS — E113393 Type 2 diabetes mellitus with moderate nonproliferative diabetic retinopathy without macular edema, bilateral: Secondary | ICD-10-CM | POA: Diagnosis not present

## 2019-03-27 DIAGNOSIS — H43813 Vitreous degeneration, bilateral: Secondary | ICD-10-CM

## 2019-03-27 DIAGNOSIS — H353231 Exudative age-related macular degeneration, bilateral, with active choroidal neovascularization: Secondary | ICD-10-CM | POA: Diagnosis not present

## 2019-03-27 DIAGNOSIS — E11319 Type 2 diabetes mellitus with unspecified diabetic retinopathy without macular edema: Secondary | ICD-10-CM | POA: Diagnosis not present

## 2019-04-24 ENCOUNTER — Other Ambulatory Visit: Payer: Self-pay

## 2019-04-24 ENCOUNTER — Encounter (INDEPENDENT_AMBULATORY_CARE_PROVIDER_SITE_OTHER): Payer: Medicare HMO | Admitting: Ophthalmology

## 2019-04-24 DIAGNOSIS — E11319 Type 2 diabetes mellitus with unspecified diabetic retinopathy without macular edema: Secondary | ICD-10-CM

## 2019-04-24 DIAGNOSIS — H43813 Vitreous degeneration, bilateral: Secondary | ICD-10-CM

## 2019-04-24 DIAGNOSIS — E113393 Type 2 diabetes mellitus with moderate nonproliferative diabetic retinopathy without macular edema, bilateral: Secondary | ICD-10-CM

## 2019-04-24 DIAGNOSIS — H353231 Exudative age-related macular degeneration, bilateral, with active choroidal neovascularization: Secondary | ICD-10-CM | POA: Diagnosis not present

## 2019-04-24 LAB — HM DIABETES EYE EXAM

## 2019-04-29 ENCOUNTER — Encounter: Payer: Self-pay | Admitting: Family Medicine

## 2019-05-10 ENCOUNTER — Other Ambulatory Visit: Payer: Self-pay | Admitting: Family Medicine

## 2019-05-22 ENCOUNTER — Encounter (INDEPENDENT_AMBULATORY_CARE_PROVIDER_SITE_OTHER): Payer: Medicare HMO | Admitting: Ophthalmology

## 2019-05-30 ENCOUNTER — Other Ambulatory Visit: Payer: Self-pay

## 2019-05-30 ENCOUNTER — Encounter (INDEPENDENT_AMBULATORY_CARE_PROVIDER_SITE_OTHER): Payer: Medicare HMO | Admitting: Ophthalmology

## 2019-05-30 DIAGNOSIS — H353231 Exudative age-related macular degeneration, bilateral, with active choroidal neovascularization: Secondary | ICD-10-CM | POA: Diagnosis not present

## 2019-05-30 DIAGNOSIS — H43813 Vitreous degeneration, bilateral: Secondary | ICD-10-CM

## 2019-05-30 DIAGNOSIS — E11319 Type 2 diabetes mellitus with unspecified diabetic retinopathy without macular edema: Secondary | ICD-10-CM

## 2019-05-30 DIAGNOSIS — E113393 Type 2 diabetes mellitus with moderate nonproliferative diabetic retinopathy without macular edema, bilateral: Secondary | ICD-10-CM | POA: Diagnosis not present

## 2019-06-01 DIAGNOSIS — E119 Type 2 diabetes mellitus without complications: Secondary | ICD-10-CM | POA: Diagnosis not present

## 2019-06-12 ENCOUNTER — Other Ambulatory Visit: Payer: Self-pay | Admitting: Family Medicine

## 2019-06-27 ENCOUNTER — Telehealth: Payer: Self-pay | Admitting: Family Medicine

## 2019-06-27 NOTE — Chronic Care Management (AMB) (Signed)
  Chronic Care Management   Note  06/27/2019 Name: MICHAELANGELO MITTELMAN MRN: [id number] DOB: [birth year]  JODY SILAS is a 86 y.o. year old male who is a primary care patient of Copland, Frederico Hamman, MD. I reached out to [name] by phone today in response to a referral sent by Mr. Asheton Scheffler Maldonado's PCP, [name], MD.   Mr. Tomey was given information about Chronic Care Management services today including:  1. CCM service includes personalized support from designated clinical staff supervised by his physician, including individualized plan of care and coordination with other care providers 2. 24/7 contact phone numbers for assistance for urgent and routine care needs. 3. Service will only be billed when office clinical staff spend 20 minutes or more in a month to coordinate care. 4. Only one practitioner may furnish and bill the service in a calendar month. 5. The patient may stop CCM services at any time (effective at the end of the month) by phone call to the office staff.   Patient agreed to services and verbal consent obtained.   Follow up plan:   Raynicia Dukes [employer] Scheduler

## 2019-07-03 ENCOUNTER — Ambulatory Visit: Payer: Medicare HMO | Admitting: Podiatry

## 2019-07-03 ENCOUNTER — Other Ambulatory Visit: Payer: Self-pay

## 2019-07-03 ENCOUNTER — Encounter: Payer: Self-pay | Admitting: Podiatry

## 2019-07-03 VITALS — Temp 97.8°F

## 2019-07-03 DIAGNOSIS — M79674 Pain in right toe(s): Secondary | ICD-10-CM | POA: Diagnosis not present

## 2019-07-03 DIAGNOSIS — B351 Tinea unguium: Secondary | ICD-10-CM | POA: Diagnosis not present

## 2019-07-03 DIAGNOSIS — M79675 Pain in left toe(s): Secondary | ICD-10-CM

## 2019-07-03 DIAGNOSIS — E1151 Type 2 diabetes mellitus with diabetic peripheral angiopathy without gangrene: Secondary | ICD-10-CM

## 2019-07-03 NOTE — Progress Notes (Signed)
This patient returns to my office for at risk foot care.  This patient requires this care by a professional since this patient will be at risk due to having diabetes with vascular disease.  Patient also has chronic kidney disease.  He presents to the office with his wife.   Patient has not been treated in over one and a half years.    This patient is unable to cut nails himself since the patient cannot reach his nails.These nails are painful walking and wearing shoes.  This patient presents for at risk foot care today.  General Appearance  Alert, conversant and in no acute stress.  Vascular  Dorsalis pedis  pulses are weakly  palpable  bilaterally. Posterior tibial pulses are absent  B/L.   Capillary return is within normal limits  bilaterally. Cold feet   bilaterally.  Neurologic  Senn-Weinstein monofilament wire test within normal limits  bilaterally. Muscle power within normal limits bilaterally.  Nails Thick disfigured discolored nails with subungual debris  from hallux to fifth toes bilaterally. No evidence of bacterial infection or drainage bilaterally.  Orthopedic  No limitations of motion  feet .  No crepitus or effusions noted.  No bony pathology or digital deformities noted.  Skin  normotropic skin with no porokeratosis noted bilaterally.  No signs of infections or ulcers noted.     Onychomycosis  Pain in right toes  Pain in left toes  Consent was obtained for treatment procedures.   Mechanical debridement of nails 1-5  bilaterally performed with a nail nipper.  Filed with dremel without incident.    Return office visit  3 months                   Told patient to return for periodic foot care and evaluation due to potential at risk complications.   Gardiner Barefoot DPM

## 2019-07-11 ENCOUNTER — Encounter (INDEPENDENT_AMBULATORY_CARE_PROVIDER_SITE_OTHER): Payer: Medicare HMO | Admitting: Ophthalmology

## 2019-07-11 DIAGNOSIS — H43813 Vitreous degeneration, bilateral: Secondary | ICD-10-CM

## 2019-07-11 DIAGNOSIS — H353231 Exudative age-related macular degeneration, bilateral, with active choroidal neovascularization: Secondary | ICD-10-CM

## 2019-07-22 ENCOUNTER — Other Ambulatory Visit: Payer: Self-pay

## 2019-07-22 ENCOUNTER — Encounter: Payer: Self-pay | Admitting: Dermatology

## 2019-07-22 ENCOUNTER — Ambulatory Visit: Payer: Medicare HMO | Admitting: Dermatology

## 2019-07-22 DIAGNOSIS — D485 Neoplasm of uncertain behavior of skin: Secondary | ICD-10-CM

## 2019-07-22 DIAGNOSIS — Z85828 Personal history of other malignant neoplasm of skin: Secondary | ICD-10-CM | POA: Diagnosis not present

## 2019-07-22 DIAGNOSIS — D492 Neoplasm of unspecified behavior of bone, soft tissue, and skin: Secondary | ICD-10-CM

## 2019-07-22 DIAGNOSIS — L820 Inflamed seborrheic keratosis: Secondary | ICD-10-CM | POA: Diagnosis not present

## 2019-07-22 NOTE — Patient Instructions (Signed)

## 2019-07-23 ENCOUNTER — Encounter: Payer: Self-pay | Admitting: Dermatology

## 2019-07-23 ENCOUNTER — Ambulatory Visit: Payer: Medicare HMO

## 2019-07-23 NOTE — Progress Notes (Addendum)
   Follow-Up Visit   Subjective  [name] is a 86 y.o. male who presents for the following: Annual Exam (right jawline- no changes, right eyebrow-crusty, nose- crusty spot).  growth Location: Face Duration: Months Quality: Darker Associated Signs/Symptoms: Modifying Factors:  Severity:  Timing: Context: History of skin cancers  The following portions of the chart were reviewed this encounter and updated as appropriate: Tobacco  Allergies  Meds  Problems  Med Hx  Surg Hx  Fam Hx      Objective  Well appearing patient in no apparent distress; mood and affect are within normal limits.  A focused examination was performed including head and neck. Relevant physical exam findings are noted in the Assessment and Plan. Several facial crusts suggestive of superficial carcinoma which Mr. Hackbart states are not bothersome and for now chooses to avoid biopsy. Wife in room during entire visit.  Assessment & Plan  Neoplasm of skin Right Zygomatic Area  Skin / nail biopsy Type of biopsy: tangential   Informed consent: discussed and consent obtained   Timeout: patient name, date of birth, surgical site, and procedure verified   Procedure prep:  Patient was prepped and draped in usual sterile fashion Prep type:  Chlorhexidine Anesthesia: the lesion was anesthetized in a standard fashion   Anesthetic:  1% lidocaine w/ epinephrine 1-100,000 local infiltration Instrument used: flexible razor blade   Hemostasis achieved with: suture and ferric subsulfate   Outcome: patient tolerated procedure well   Post-procedure details: sterile dressing applied and wound care instructions given   Dressing type: petrolatum   Additional details:  Patient identified lesion of concern.  Lesion identified by physician.  Specimen 1 - Surgical pathology Differential Diagnosis: r/o atypia Check Margins: No

## 2019-08-09 ENCOUNTER — Telehealth: Payer: Self-pay

## 2019-08-09 DIAGNOSIS — E1159 Type 2 diabetes mellitus with other circulatory complications: Secondary | ICD-10-CM

## 2019-08-09 DIAGNOSIS — I10 Essential (primary) hypertension: Secondary | ICD-10-CM

## 2019-08-09 NOTE — Telephone Encounter (Signed)
Per written referral from PCP, requesting referral in Epic for [name] to chronic care management pharmacy services for the following conditions:   Essential hypertension, benign  [I10]  Type 2 diabetes mellitus with vascular disease [E11.59]  [name], PharmD Clinical Pharmacist [place] [facility] [phone]

## 2019-08-12 NOTE — Telephone Encounter (Signed)
Referral placed.

## 2019-08-14 NOTE — Chronic Care Management (AMB) (Signed)
 Chronic Care Management Pharmacy  Name: [name]  MRN: [id number] DOB: 05/12/1930   Chief Complaint/ HPI  [name],  86 y.o., male presents for their Initial CCM visit with the clinical pharmacist via telephone.  PCP : Copland, Spencer, MD  Their chronic conditions include: type 2 diabetes, hypertension, CAD, alzheimer's disease, left lung cancer stage 1, hypercholesterolemia, GAD, vertigo, history of tobacco abuse  Patient concerns: denies medication concerns  Office Visits:  11/07/18: Copland AWV - cont current meds  Consult Visit:  07/22/19: neoplasm of skin biopsy  07/02/28: pain due to onychomycosis - mechanical debridement of nails 1-5, rtc 3 months  Allergies  Allergen Reactions  . Sulfa Antibiotics Nausea Only   Medications: Outpatient Encounter Medications as of 08/15/2019  Medication Sig  . aspirin EC 81 MG tablet Take 81 mg by mouth daily.  . diphenhydrAMINE (BENADRYL) 25 MG tablet Take 25 mg by mouth 2 (two) times daily.  . donepezil (ARICEPT) 10 MG tablet TAKE 1 TABLET BY MOUTH EVERYDAY AT BEDTIME  . glipiZIDE (GLUCOTROL) 5 MG tablet TAKE 1 TABLET BY MOUTH EVERY DAY BEFORE BREAKFAST  . metFORMIN (GLUCOPHAGE-XR) 500 MG 24 hr tablet TAKE 1 TABLET BY MOUTH TWICE A DAY  . Multiple Vitamins-Minerals (ICAPS LUTEIN & ZEAXANTHIN PO) Take 1 capsule by mouth 2 (two) times daily.   . Omega-3 Fatty Acids (FISH OIL) 1000 MG CAPS Take 1,000 mg by mouth at bedtime.  . sertraline (ZOLOFT) 50 MG tablet TAKE 1 TABLET BY MOUTH EVERY DAY  . triamcinolone cream (KENALOG) 0.1 % Apply 1 application topically 2 (two) times daily.  . atorvastatin (LIPITOR) 40 MG tablet TAKE 1 TABLET BY MOUTH EVERY DAY   No facility-administered encounter medications on file as of 08/15/2019.   Current Diagnosis/Assessment:   Financial Resource Strain: Low Risk   . Difficulty of Paying Living Expenses: Not hard at all   Goals Addressed            This Visit's Progress   .  Pharmacy Care Plan       CARE PLAN ENTRY  Current Barriers:  . Chronic Disease Management support, education, and care coordination needs related to Hyperlipidemia, Diabetes, and Anxiety   Hyperlipidemia . Pharmacist Clinical Goal(s): o Over the next 6 months, patient will work with PharmD and providers to maintain LDL goal < 70 . Current regimen:  o Atorvastatin 40 mg - 1 tablet daily o Fish oil 1200 mg - 1 capsule daily o Aspirin 81 mg - 1 tablet daily . Interventions: o Counseled on indication for atorvastatin and importance of taking daily . Patient self care activities - Over the next 6 months, patient will: o Take atorvastatin daily as prescribed  Diabetes . Pharmacist Clinical Goal(s): o Over the next 6 months, patient will work with PharmD and providers to maintain A1c goal <7% . Current regimen:  o Metformin 500 mg - 1 tablet twice daily with meals o Glipizide 5 mg - 1 tablet daily before breakfast . Interventions: o Discussed hypoglycemia and appropriate treatment . Patient self care activities - Over the next 6 months, patient will: o Check blood sugar once daily, document, and provide at future appointments o Contact provider with any episodes of hypoglycemia (blood glucose < 70 mg/dL)  Anxiety . Pharmacist Clinical Goal(s) o Over the next 6 months, patient will work with PharmD and providers to reduce caffeine intake . Current regimen:  o Sertraline 50 mg - 1 tablet daily . Interventions: o Recommend cutting   back on caffeine by using half/half decaf/caffeinated coffee (currently drinking 3-5 large cups of coffee per day) . Patient self care activities - Over the next 6 months, patient will: o Reduce caffeine intake to see if jitters/anxiety improve  Initial goal documentation      Hypertension   CMP Latest Ref Rng & Units 10/31/2018 06/25/2018 08/30/2017  Glucose 70 - 99 mg/dL 222(H) 185(H) 188(H)  BUN 6 - 23 mg/dL _0 Creatinine 0.40 - 1.50 mg/dL 1.28  1.29(H) 1.28  Sodium 135 - 145 mEq/L 138 137 138  Potassium 3.5 - 5.1 mEq/L 5.0 4.3 5.4(H)  Chloride 96 - 112 mEq/L 104 99 103  CO2 19 - 32 mEq/L _1 Calcium 8.4 - 10.5 mg/dL 9.0 9.6 9.1  Total Protein 6.0 - 8.3 g/dL 6.3 - 6.8  Total Bilirubin 0.2 - 1.2 mg/dL 0.4 - 0.5  Alkaline Phos 39 - 117 U/L 75 - 64  AST 0 - 37 U/L 10 - 11  ALT 0 - 53 U/L 9 - 13  CrCl: 49 ml/min  Office blood pressures are: BP Readings from Last 3 Encounters:  11/07/18 140/74  06/26/18 (!) 155/80  11/07/17 140/82   BP goal < 140/90 mmHg Patient has failed these meds in the past: none reported  Patient is currently controlled on the following medications:   No pharmacotherapy  Plan: Continue heart healthy diet and management without medication.  Hyperlipidemia/CAD   Lipid Panel     Component Value Date/Time   CHOL 121 10/31/2018 1028   TRIG 170.0 (H) 10/31/2018 1028   HDL 35.60 (L) 10/31/2018 1028   CHOLHDL 3 10/31/2018 1028   VLDL 34.0 10/31/2018 1028   LDLCALC 52 10/31/2018 1028   LDLDIRECT 54.0 08/30/2017 1104    LDL goal < 70 Patient has failed these meds in past:  Patient is currently controlled on the following medications:   Atorvastatin 40 mg - 1 tablet daily   Fish oil 1200 mg with 360 mg omega-3 fatty acids- 1 capsule daily  Aspirin 81 mg - 1 tablet daily  CBC Latest Ref Rng & Units 10/31/2018 06/25/2018 08/30/2017  WBC 4.0 - 10.5 K/uL 6.1 11.6(H) 7.5  Hemoglobin 13.0 - 17.0 g/dL 12.5(L) 14.0 13.6  Hematocrit 39.0 - 52.0 % 37.8(L) 44.1 40.1  Platelets 150.0 - 400.0 K/uL 151.0 135(L) 152.0   We discussed: denies abnormal bruising or bleeding with aspirin; pt reports not liking taking multiple medications and unclear why statin is needed, discussed risks/benefits and reason for taking due to CAD to prevent MI or stroke. Wife reports medications was refilled today.   Plan: Continue current medications; Encouraged adherence to atorvastatin.  Diabetes   Recent Relevant  Labs: Lab Results  Component Value Date/Time   HGBA1C 6.9 (H) 10/31/2018 10:28 AM   HGBA1C 6.8 (H) 08/30/2017 11:04 AM   EGFR 44 (L) 07/30/2015 09:57 AM   MICROALBUR 3.4 (H) 10/31/2018 10:39 AM   MICROALBUR 3.6 (H) 08/30/2017 10:55 AM    Checking BG: every 2-3 days (time of day varies) Reports BG: 150-180 Reports lowest: 98 - felt lightheaded/jittery, drinks orange juice   A1c goal < 7% Patient has failed these meds in past: none  Patient is currently controlled on the following medications:   Glipizide 5 mg - 1 tablet daily before breakfast  Metformin 500 mg - 1 tablet twice daily  Last diabetic eye exam:  Lab Results  Component Value Date/Time   HMDIABEYEEXA Retinopathy (A) 04/24/2019 12:00 AM  Last diabetic foot exam: per PCP annual visit 11/2018   We discussed: call if any episodes of hypoglycemia   Plan: Continue current medications   Alzheimers   Patient has failed these meds in past: none reported Patient is currently controlled on the following medications:   Donepezil 10 mg - 1 tablet daily at bedtime  We discussed: overall doing well, denies adverse effects   Plan: Continue current medications   GAD   Patient has failed these meds in past: none reported Patient is currently controlled on the following medications:   Sertraline 50 mg - 1 tablet daily  We discussed: pt asked about increasing sertraline as he felt more nervous lately; after discussing further, pt is drinking 3-5 large cups of caffienated coffee a day and feesl jittery afterwards, also feels some shakiness when his BG is < 100 or has skipped breakfast - recommended cutting back on caffeine and avoiding missing meals. If still feeling anxious, may discuss increasing sertraline at next visit.   Plan: Continue current medications; Reduce caffeine intake.   Vaccines   Reviewed and discussed patient's vaccination history.    Immunization History  Administered Date(s) Administered  .  Influenza Whole 01/03/2007  . Influenza, High Dose Seasonal PF 01/19/2018, 03/06/2019  . Influenza,inj,Quad PF,6+ Mos 02/06/2013, 01/20/2014, 01/15/2015, 01/29/2016  . Influenza-Unspecified 01/02/2017  . Pneumococcal Conjugate-13 01/20/2014  . Pneumococcal Polysaccharide-23 01/02/2001  . Td 04/04/1994  . Tdap 01/25/2013   Plan: Recommended patient receive Shingrix   Medication Management  Misc: triamcinolone cream 0.1%  OTCs: multivitamin with lutein, diphenhydramine 25 mg - BID  Pharmacy/Benefits: [facility]  Adherence: atorvastatin - discussed reason for taking and encouraged regular refills  Social support: wife assists with medications  Affordability: denies concerns  CCM Follow Up: 02/24/20 at 1:30 PM (telephone)   , PharmD Clinical Pharmacist [facility] [phone]    

## 2019-08-15 ENCOUNTER — Ambulatory Visit: Payer: Medicare HMO

## 2019-08-15 ENCOUNTER — Other Ambulatory Visit: Payer: Self-pay

## 2019-08-15 DIAGNOSIS — E7800 Pure hypercholesterolemia, unspecified: Secondary | ICD-10-CM

## 2019-08-15 DIAGNOSIS — E1159 Type 2 diabetes mellitus with other circulatory complications: Secondary | ICD-10-CM

## 2019-08-15 DIAGNOSIS — F411 Generalized anxiety disorder: Secondary | ICD-10-CM

## 2019-08-15 NOTE — Patient Instructions (Signed)
Aug 15, 2019  Dear [name],  It was a pleasure meeting you during our initial appointment on Aug 15, 2019. Below is a summary of the goals we discussed and components of chronic care management. Please contact me anytime with questions or concerns.   Visit Information  Goals Addressed            This Visit's Progress   . Pharmacy Care Plan       CARE PLAN ENTRY  Current Barriers:  . Chronic Disease Management support, education, and care coordination needs related to Hyperlipidemia, Diabetes, and Anxiety   Hyperlipidemia . Pharmacist Clinical Goal(s): o Over the next 6 months, patient will work with PharmD and providers to maintain LDL goal < 70 . Current regimen:  o Atorvastatin 40 mg - 1 tablet daily o Fish oil 1200 mg - 1 capsule daily o Aspirin 81 mg - 1 tablet daily . Interventions: o Counseled on indication for atorvastatin and importance of taking daily . Patient self care activities - Over the next 6 months, patient will: o Take atorvastatin daily as prescribed  Diabetes . Pharmacist Clinical Goal(s): o Over the next 6 months, patient will work with PharmD and providers to maintain A1c goal <7% . Current regimen:  o Metformin 500 mg - 1 tablet twice daily with meals o Glipizide 5 mg - 1 tablet daily before breakfast . Interventions: o Discussed hypoglycemia and appropriate treatment . Patient self care activities - Over the next 6 months, patient will: o Check blood sugar once daily, document, and provide at future appointments o Contact provider with any episodes of hypoglycemia (blood glucose < 70 mg/dL)  Anxiety . Pharmacist Clinical Goal(s) o Over the next 6 months, patient will work with PharmD and providers to reduce caffeine intake . Current regimen:  o Sertraline 50 mg - 1 tablet daily . Interventions: o Recommend cutting back on caffeine by using half/half decaf/caffeinated coffee (currently drinking 3-5 large cups of coffee per  day) . Patient self care activities - Over the next 6 months, patient will: o Reduce caffeine intake to see if jitters/anxiety improve  Initial goal documentation      [name] was given information about Chronic Care Management services today including:  1. CCM service includes personalized support from designated clinical staff supervised by his physician, including individualized plan of care and coordination with other care providers 2. 24/7 contact phone numbers for assistance for urgent and routine care needs. 3. Standard insurance, coinsurance, copays and deductibles apply for chronic care management only during months in which we provide at least 20 minutes of these services. Most insurances cover these services at 100%, however patients may be responsible for any copay, coinsurance and/or deductible if applicable. This service may help you avoid the need for more expensive face-to-face services. 4. Only one practitioner may furnish and bill the service in a calendar month. 5. The patient may stop CCM services at any time (effective at the end of the month) by phone call to the office staff.  Patient agreed to services and verbal consent obtained.   The patient verbalized understanding of instructions provided today and agreed to receive a mailed copy of patient instruction and/or educational materials. Telephone follow up appointment with pharmacy team member scheduled for: 02/24/20 at 1:30 PM (telephone)  [name], PharmD Clinical Pharmacist [facility] [phone]  Hypoglycemia Hypoglycemia is when the sugar (glucose) level in your blood is too low. Signs of low blood  sugar may include:  Feeling: ? Hungry. ? Worried or nervous (anxious). ? Sweaty and clammy. ? Confused. ? Dizzy. ? Sleepy. ? Sick to your stomach (nauseous).  Having: ? A fast heartbeat. ? A headache. ? A change in your vision. ? Tingling or no feeling (numbness)  around your mouth, lips, or tongue. ? Jerky movements that you cannot control (seizure).  Having trouble with: ? Moving (coordination). ? Sleeping. ? Passing out (fainting). ? Getting upset easily (irritability). Low blood sugar can happen to people who have diabetes and people who do not have diabetes. Low blood sugar can happen quickly, and it can be an emergency. Treating low blood sugar Low blood sugar is often treated by eating or drinking something sugary right away, such as:  Fruit juice, 4-6 oz (120-150 mL).  Regular soda (not diet soda), 4-6 oz (120-150 mL).  Low-fat milk, 4 oz (120 mL).  Several pieces of hard candy.  Sugar or honey, 1 Tbsp (15 mL). Treating low blood sugar if you have diabetes If you can think clearly and swallow safely, follow the 15:15 rule:  Take 15 grams of a fast-acting carb (carbohydrate). Talk with your doctor about how much you should take.  Always keep a source of fast-acting carb with you, such as: ? Sugar tablets (glucose pills). Take 3-4 pills. ? 6-8 pieces of hard candy. ? 4-6 oz (120-150 mL) of fruit juice. ? 4-6 oz (120-150 mL) of regular (not diet) soda. ? 1 Tbsp (15 mL) honey or sugar.  Check your blood sugar 15 minutes after you take the carb.  If your blood sugar is still at or below 70 mg/dL (3.9 mmol/L), take 15 grams of a carb again.  If your blood sugar does not go above 70 mg/dL (3.9 mmol/L) after 3 tries, get help right away.  After your blood sugar goes back to normal, eat a meal or a snack within 1 hour.  Treating very low blood sugar If your blood sugar is at or below 54 mg/dL (3 mmol/L), you have very low blood sugar (severe hypoglycemia). This may also cause:  Passing out.  Jerky movements you cannot control (seizure).  Losing consciousness (coma). This is an emergency. Do not wait to see if the symptoms will go away. Get medical help right away. Call your local emergency services (911 in the [place] Do not  drive yourself to the hospital. If you have very low blood sugar and you cannot eat or drink, you may need a glucagon shot (injection). A family member or friend should learn how to check your blood sugar and how to give you a glucagon shot. Ask your doctor if you need to have a glucagon shot kit at home. Follow these instructions at home: General instructions  Take over-the-counter and prescription medicines only as told by your doctor.  Stay aware of your blood sugar as told by your doctor.  Limit alcohol intake to no more than 1 drink a day for nonpregnant women and 2 drinks a day for men. One drink equals 12 oz of beer (355 mL), 5 oz of wine (148 mL), or 1 oz of hard liquor (44 mL).  Keep all follow-up visits as told by your doctor. This is important. If you have diabetes:   Follow your diabetes care plan as told by your doctor. Make sure you: ? Know the signs of low blood sugar. ? Take your medicines as told. ? Follow your exercise and meal plan. ? Eat on time.  Do not skip meals. ? Check your blood sugar as often as told by your doctor. Always check it before and after exercise. ? Follow your sick day plan when you cannot eat or drink normally. Make this plan ahead of time with your doctor.  Share your diabetes care plan with: ? Your work or school. ? People you live with.  Check your pee (urine) for ketones: ? When you are sick. ? As told by your doctor.  Carry a card or wear jewelry that says you have diabetes. Contact a doctor if:  You have trouble keeping your blood sugar in your target range.  You have low blood sugar often. Get help right away if:  You still have symptoms after you eat or drink something sugary.  Your blood sugar is at or below 54 mg/dL (3 mmol/L).  You have jerky movements that you cannot control.  You pass out. These symptoms may be an emergency. Do not wait to see if the symptoms will go away. Get medical help right away. Call your local  emergency services (911 in the [place] Do not drive yourself to the hospital. Summary  Hypoglycemia happens when the level of sugar (glucose) in your blood is too low.  Low blood sugar can happen to people who have diabetes and people who do not have diabetes. Low blood sugar can happen quickly, and it can be an emergency.  Make sure you know the signs of low blood sugar and know how to treat it.  Always keep a source of sugar (fast-acting carb) with you to treat low blood sugar. This information is not intended to replace advice given to you by your health care provider. Make sure you discuss any questions you have with your health care provider. Document Revised: 07/12/2018 Document Reviewed: 04/24/2015 Elsevier Patient Education  2020 [employer].

## 2019-08-16 NOTE — Progress Notes (Signed)
I have collaborated with the care management provider regarding care management and care coordination activities outlined in this encounter and have reviewed this encounter including documentation in the note and care plan. I am certifying that I agree with the content of this note and encounter as supervising physician.  

## 2019-08-22 ENCOUNTER — Other Ambulatory Visit: Payer: Self-pay

## 2019-08-22 ENCOUNTER — Encounter (INDEPENDENT_AMBULATORY_CARE_PROVIDER_SITE_OTHER): Payer: Medicare HMO | Admitting: Ophthalmology

## 2019-08-22 DIAGNOSIS — H43813 Vitreous degeneration, bilateral: Secondary | ICD-10-CM | POA: Diagnosis not present

## 2019-08-22 DIAGNOSIS — H353231 Exudative age-related macular degeneration, bilateral, with active choroidal neovascularization: Secondary | ICD-10-CM | POA: Diagnosis not present

## 2019-08-31 DIAGNOSIS — E119 Type 2 diabetes mellitus without complications: Secondary | ICD-10-CM | POA: Diagnosis not present

## 2019-09-07 ENCOUNTER — Other Ambulatory Visit: Payer: Self-pay | Admitting: Family Medicine

## 2019-09-19 DIAGNOSIS — R69 Illness, unspecified: Secondary | ICD-10-CM | POA: Diagnosis not present

## 2019-09-19 DIAGNOSIS — H903 Sensorineural hearing loss, bilateral: Secondary | ICD-10-CM | POA: Diagnosis not present

## 2019-09-26 ENCOUNTER — Encounter (INDEPENDENT_AMBULATORY_CARE_PROVIDER_SITE_OTHER): Payer: Medicare HMO | Admitting: Ophthalmology

## 2019-09-26 ENCOUNTER — Other Ambulatory Visit: Payer: Self-pay

## 2019-09-26 DIAGNOSIS — H43813 Vitreous degeneration, bilateral: Secondary | ICD-10-CM

## 2019-09-26 DIAGNOSIS — H353231 Exudative age-related macular degeneration, bilateral, with active choroidal neovascularization: Secondary | ICD-10-CM

## 2019-10-31 ENCOUNTER — Other Ambulatory Visit: Payer: Self-pay

## 2019-10-31 ENCOUNTER — Encounter (INDEPENDENT_AMBULATORY_CARE_PROVIDER_SITE_OTHER): Payer: Medicare HMO | Admitting: Ophthalmology

## 2019-10-31 DIAGNOSIS — H43813 Vitreous degeneration, bilateral: Secondary | ICD-10-CM

## 2019-10-31 DIAGNOSIS — H353231 Exudative age-related macular degeneration, bilateral, with active choroidal neovascularization: Secondary | ICD-10-CM

## 2019-11-04 ENCOUNTER — Encounter: Payer: Self-pay | Admitting: Podiatry

## 2019-11-04 ENCOUNTER — Ambulatory Visit: Payer: Medicare HMO | Admitting: Podiatry

## 2019-11-04 ENCOUNTER — Other Ambulatory Visit: Payer: Self-pay

## 2019-11-04 DIAGNOSIS — B351 Tinea unguium: Secondary | ICD-10-CM | POA: Diagnosis not present

## 2019-11-04 DIAGNOSIS — M79674 Pain in right toe(s): Secondary | ICD-10-CM | POA: Diagnosis not present

## 2019-11-04 DIAGNOSIS — M79675 Pain in left toe(s): Secondary | ICD-10-CM | POA: Diagnosis not present

## 2019-11-04 DIAGNOSIS — E1151 Type 2 diabetes mellitus with diabetic peripheral angiopathy without gangrene: Secondary | ICD-10-CM

## 2019-11-04 NOTE — Progress Notes (Signed)
This patient returns to my office for at risk foot care.  This patient requires this care by a professional since this patient will be at risk due to having diabetes with vascular disease.  Patient also has chronic kidney disease.  He presents to the office with his wife.      This patient is unable to cut nails himself since the patient cannot reach his nails.These nails are painful walking and wearing shoes.  This patient presents for at risk foot care today.  General Appearance  Alert, conversant and in no acute stress.  Vascular  Dorsalis pedis  pulses are weakly  palpable  bilaterally. Posterior tibial pulses are absent  B/L.   Capillary return is within normal limits  bilaterally. Cold feet   bilaterally.  Neurologic  Senn-Weinstein monofilament wire test within normal limits  bilaterally. Muscle power within normal limits bilaterally.  Nails Thick disfigured discolored nails with subungual debris  from hallux to fifth toes bilaterally. No evidence of bacterial infection or drainage bilaterally.  Orthopedic  No limitations of motion  feet .  No crepitus or effusions noted.  No bony pathology or digital deformities noted.  Skin  normotropic skin with no porokeratosis noted bilaterally.  No signs of infections or ulcers noted.     Onychomycosis  Pain in right toes  Pain in left toes  Consent was obtained for treatment procedures.   Mechanical debridement of nails 1-5  bilaterally performed with a nail nipper.  Filed with dremel without incident.    Return office visit  3 months                   Told patient to return for periodic foot care and evaluation due to potential at risk complications.   Gardiner Barefoot DPM

## 2019-11-30 DIAGNOSIS — E119 Type 2 diabetes mellitus without complications: Secondary | ICD-10-CM | POA: Diagnosis not present

## 2019-12-01 ENCOUNTER — Telehealth: Payer: Self-pay | Admitting: Family Medicine

## 2019-12-02 NOTE — Telephone Encounter (Signed)
Please schedule MWV with nurse and CPE with Dr. Copland.  °

## 2019-12-03 DIAGNOSIS — R69 Illness, unspecified: Secondary | ICD-10-CM | POA: Diagnosis not present

## 2019-12-04 ENCOUNTER — Encounter (INDEPENDENT_AMBULATORY_CARE_PROVIDER_SITE_OTHER): Payer: Medicare HMO | Admitting: Ophthalmology

## 2019-12-04 NOTE — Telephone Encounter (Signed)
Left message asking pt to call office  °

## 2019-12-05 DIAGNOSIS — H353231 Exudative age-related macular degeneration, bilateral, with active choroidal neovascularization: Secondary | ICD-10-CM | POA: Diagnosis not present

## 2019-12-16 ENCOUNTER — Other Ambulatory Visit: Payer: Self-pay | Admitting: Family Medicine

## 2019-12-16 DIAGNOSIS — Z79899 Other long term (current) drug therapy: Secondary | ICD-10-CM

## 2019-12-16 DIAGNOSIS — E119 Type 2 diabetes mellitus without complications: Secondary | ICD-10-CM

## 2019-12-16 DIAGNOSIS — E785 Hyperlipidemia, unspecified: Secondary | ICD-10-CM

## 2019-12-16 DIAGNOSIS — C61 Malignant neoplasm of prostate: Secondary | ICD-10-CM

## 2019-12-19 DIAGNOSIS — H353211 Exudative age-related macular degeneration, right eye, with active choroidal neovascularization: Secondary | ICD-10-CM | POA: Diagnosis not present

## 2019-12-19 NOTE — Telephone Encounter (Signed)
917 labs  9/23 cpx

## 2019-12-20 ENCOUNTER — Other Ambulatory Visit: Payer: Self-pay

## 2019-12-20 ENCOUNTER — Other Ambulatory Visit (INDEPENDENT_AMBULATORY_CARE_PROVIDER_SITE_OTHER): Payer: Medicare HMO

## 2019-12-20 DIAGNOSIS — E785 Hyperlipidemia, unspecified: Secondary | ICD-10-CM | POA: Diagnosis not present

## 2019-12-20 DIAGNOSIS — E119 Type 2 diabetes mellitus without complications: Secondary | ICD-10-CM | POA: Diagnosis not present

## 2019-12-20 DIAGNOSIS — C61 Malignant neoplasm of prostate: Secondary | ICD-10-CM

## 2019-12-20 DIAGNOSIS — Z79899 Other long term (current) drug therapy: Secondary | ICD-10-CM | POA: Diagnosis not present

## 2019-12-20 LAB — CBC WITH DIFFERENTIAL/PLATELET
Basophils Absolute: 0 10*3/uL (ref 0.0–0.1)
Basophils Relative: 0.4 % (ref 0.0–3.0)
Eosinophils Absolute: 0.1 10*3/uL (ref 0.0–0.7)
Eosinophils Relative: 1.7 % (ref 0.0–5.0)
HCT: 39.5 % (ref 39.0–52.0)
Hemoglobin: 13.3 g/dL (ref 13.0–17.0)
Lymphocytes Relative: 13.9 % (ref 12.0–46.0)
Lymphs Abs: 0.9 10*3/uL (ref 0.7–4.0)
MCHC: 33.7 g/dL (ref 30.0–36.0)
MCV: 99.8 fl (ref 78.0–100.0)
Monocytes Absolute: 1.4 10*3/uL — ABNORMAL HIGH (ref 0.1–1.0)
Monocytes Relative: 21.5 % — ABNORMAL HIGH (ref 3.0–12.0)
Neutro Abs: 4 10*3/uL (ref 1.4–7.7)
Neutrophils Relative %: 62.5 % (ref 43.0–77.0)
Platelets: 149 10*3/uL — ABNORMAL LOW (ref 150.0–400.0)
RBC: 3.96 Mil/uL — ABNORMAL LOW (ref 4.22–5.81)
RDW: 13.2 % (ref 11.5–15.5)
WBC: 6.4 10*3/uL (ref 4.0–10.5)

## 2019-12-20 LAB — MICROALBUMIN / CREATININE URINE RATIO
Creatinine,U: 446.9 mg/dL
Microalb Creat Ratio: 1.7 mg/g (ref 0.0–30.0)
Microalb, Ur: 7.8 mg/dL — ABNORMAL HIGH (ref 0.0–1.9)

## 2019-12-20 LAB — BASIC METABOLIC PANEL
BUN: 22 mg/dL (ref 6–23)
CO2: 29 mEq/L (ref 19–32)
Calcium: 8.8 mg/dL (ref 8.4–10.5)
Chloride: 103 mEq/L (ref 96–112)
Creatinine, Ser: 1.47 mg/dL (ref 0.40–1.50)
GFR: 45.3 mL/min — ABNORMAL LOW (ref >60.00)
Glucose, Bld: 198 mg/dL — ABNORMAL HIGH (ref 70–99)
Potassium: 5.2 mEq/L — ABNORMAL HIGH (ref 3.5–5.1)
Sodium: 139 mEq/L (ref 135–145)

## 2019-12-20 LAB — HEPATIC FUNCTION PANEL
ALT: 8 U/L (ref 0–53)
AST: 10 U/L (ref 0–37)
Albumin: 4.3 g/dL (ref 3.5–5.2)
Alkaline Phosphatase: 75 U/L (ref 39–117)
Bilirubin, Direct: 0.1 mg/dL (ref 0.0–0.3)
Total Bilirubin: 0.6 mg/dL (ref 0.2–1.2)
Total Protein: 6.6 g/dL (ref 6.0–8.3)

## 2019-12-20 LAB — HEMOGLOBIN A1C: Hgb A1c MFr Bld: 7 % — ABNORMAL HIGH (ref 4.6–6.5)

## 2019-12-20 LAB — LIPID PANEL
Cholesterol: 189 mg/dL (ref 0–200)
HDL: 29.9 mg/dL — ABNORMAL LOW (ref >39.00)
NonHDL: 159.5
Total CHOL/HDL Ratio: 6
Triglycerides: 260 mg/dL — ABNORMAL HIGH (ref 0.0–149.0)
VLDL: 52 mg/dL — ABNORMAL HIGH (ref 0.0–40.0)

## 2019-12-20 LAB — LDL CHOLESTEROL, DIRECT: Direct LDL: 130 mg/dL

## 2019-12-20 LAB — PSA, MEDICARE: PSA: 0 ng/ml — ABNORMAL LOW (ref 0.10–4.00)

## 2019-12-26 ENCOUNTER — Ambulatory Visit (INDEPENDENT_AMBULATORY_CARE_PROVIDER_SITE_OTHER): Payer: Medicare HMO | Admitting: Family Medicine

## 2019-12-26 ENCOUNTER — Other Ambulatory Visit: Payer: Self-pay

## 2019-12-26 ENCOUNTER — Encounter: Payer: Self-pay | Admitting: Family Medicine

## 2019-12-26 VITALS — BP 114/64 | HR 73 | Temp 98.0°F | Ht 69.0 in | Wt 180.0 lb

## 2019-12-26 DIAGNOSIS — Z0000 Encounter for general adult medical examination without abnormal findings: Secondary | ICD-10-CM

## 2019-12-26 DIAGNOSIS — Z23 Encounter for immunization: Secondary | ICD-10-CM

## 2019-12-26 MED ORDER — SERTRALINE HCL 100 MG PO TABS
100.0000 mg | ORAL_TABLET | Freq: Every day | ORAL | 3 refills | Status: DC
Start: 2019-12-26 — End: 2020-01-21

## 2019-12-26 NOTE — Progress Notes (Signed)
Lonzell Dorris T. Naimah Yingst, MD, [place] at [facility] [place], [address]  Phone: [phone]  FAX: [phone]  PERRI LAMAGNA - 86 y.o. male  MRN [id number]  Date of Birth: [birth year]  Date: 12/26/2019  PCP: [name], MD  Referral: [name], MD  Chief Complaint  Patient presents with  . Medicare Wellness    Has not done part 1 with Health Nurse  . Depression    Does not feel Zoloft is working as well as it used to.  . Allergic Rhinitis     This visit occurred during the SARS-CoV-2 public health emergency.  Safety protocols were in place, including screening questions prior to the visit, additional usage of staff PPE, and extensive cleaning of exam room while observing appropriate contact time as indicated for disinfecting solutions.   Patient Care Team: [name], MD as PCP - General (Family Medicine) Sherren Mocha, MD as PCP - Cardiology (Cardiology) [name], [facility] as Pharmacist (Pharmacist) Subjective:   [name] is a 86 y.o. pleasant patient who presents for a medicare wellness examination:  Preventative Health Maintenance Visit:  Health Maintenance Summary Reviewed and updated, unless pt declines services.  Tobacco History Reviewed. Alcohol: No concerns, no excessive use Exercise Habits: He does walk STD concerns: no risk or activity to increase risk Drug Use: None  Depression Sad a lot of the time Going on for about a year Sleeong ok Interested ok, no energy No guilt  He does not think he is doing quite as well and is feeling sad a lot of the time.  He does have some normal interest, but he has no energy.  He does not have any SI or HI.  Both he and his wife feel that this is the case.  Zoloft at 50 mg - increase to 100 mg  He does have some skin cancers at and about his ear and eye, and for right now he and Dr. Denna Haggard are  going to monitor this.  The Mohs surgeons at [facility] wanted to do extensive Mohs surgery, but the patient and his wife think that the potential risks outweigh the potential benefits here, and I completely agree..  Wt Readings from Last 3 Encounters:  12/26/19 180 lb (81.6 kg)  11/07/18 186 lb 4 oz (84.5 kg)  06/25/18 190 lb (86.2 kg)     Lab Results  Component Value Date   HGBA1C 7.0 (H) 12/20/2019     Health Maintenance  Topic Date Due  . OPHTHALMOLOGY EXAM  04/23/2020  . HEMOGLOBIN A1C  06/18/2020  . FOOT EXAM  07/02/2020  . URINE MICROALBUMIN  12/19/2020  . TETANUS/TDAP  01/26/2023  . INFLUENZA VACCINE  Completed  . COVID-19 Vaccine  Completed  . PNA vac Low Risk Adult  Completed    Immunization History  Administered Date(s) Administered  . Fluad Quad(high Dose 65+) 12/26/2019  . Influenza Whole 01/03/2007  . Influenza, High Dose Seasonal PF 01/19/2018, 03/06/2019  . Influenza,inj,Quad PF,6+ Mos 02/06/2013, 01/20/2014, 01/15/2015, 01/29/2016  . Influenza-Unspecified 01/02/2017  . Moderna SARS-COVID-2 Vaccination 06/03/2019, 07/04/2019  . Pneumococcal Conjugate-13 01/20/2014  . Pneumococcal Polysaccharide-23 01/02/2001  . Td 04/04/1994  . Tdap 01/25/2013    Patient Active Problem List   Diagnosis Date Noted  . CAD (coronary artery disease)     Priority: High  . Adenocarcinoma of left lung, stage 1 ([place]) 07/25/2011    Priority: High  .  Cancer of prostate w/med recur risk (T2b-c or Gleason 7 or PSA 10-20) ([place]) 07/25/2011    Priority: High  . Type 2 diabetes mellitus with vascular disease ([place]) 05/21/2007    Priority: High  . Pericardial tamponade 09/17/2012    Priority: Medium  . Acute pericarditis, unspecified 09/13/2012    Priority: Medium  . HYPERCHOLESTEROLEMIA 05/21/2007    Priority: Medium  . Essential hypertension 01/08/2007    Priority: Medium  . Sensorineural hearing loss (SNHL) of both ears 05/15/2017  . Tinnitus, bilateral 05/15/2017  . Macular  degeneration of both eyes 01/21/2014  . 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 Nonmelanoma skin cancer 07/25/2011  . History of colonic polyps 07/25/2011  . CARCINOMA, SKIN, SQUAMOUS CELL 01/08/2007  . Generalized anxiety disorder 01/08/2007  . ALZHEIMER'S DISEASE, MILD 01/08/2007    Past Medical History:  Diagnosis Date  . Adenocarcinoma of left lung, stage 1 ([place]) 2001   T1N0 stage I  adenoca left lung resected 01/03/00   . Alzheimer's disease ([place])   . CAD (coronary artery disease) 2014   a. 09/2012 Cath: LM nl, LAD 50-60p, D1 60-70 m, D1 50ost, LCX nl, RCA min irregs, EF 55-65%.  . Generalized anxiety disorder 01/08/2007   Qualifier: Diagnosis of  By: Diona Browner MD, [name]    . History of colonic polyps 07/25/2011  . Macular degeneration of both eyes 01/21/2014  . Nonmelanoma skin cancer 07/25/2011   Multiple lesions excised face/nose  . Pericarditis    a. 09/2012 with effusion and tamponade, s/p window.  b.  F/u Echo 09/19/12: mod LVH, EF 55%, Gr 1 DD, Tr MR, mild RVE, no residual effusion  . Prostate CA ([place]) 04/2001   Gleason 7  S/P prostatectomy 04/11/01  . 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 SCC (squamous cell carcinoma) 02/18/2013   BELOW LEFT EYE SCC IN SITU TX WITH BX  . SCC (squamous cell carcinoma) 08/27/2008   RIGHT OUTER BROW FOCAL IN SITU TX WITH BX  . SCC (squamous cell carcinoma) 08/27/2008   BELOW LEFT EYE FOCAL IN SITU TX CX3 5FU  . SCC (squamous cell carcinoma) 10/25/2006   RIGHT ELBOW SCC IN SITU TX WITH BX CX3 5FU  . SCC (squamous cell carcinoma) 04/12/2005   RIGHT NECK INF. SCC IN SITU TX CX3  . SCC (squamous cell carcinoma) 04/12/2005   RIGHT NECK SUP. SCC IN SITU TX EXC  . SCC (squamous cell carcinoma) 04/16/2013   LEFT TEMPLE SCC IN SITU TX CX3 5FU  . SCC (squamous cell carcinoma) 04/16/2013   BELOW LEFT EYE SCC IN SITU TX CX3 5FU  . SCC (squamous cell carcinoma) 04/16/2013   BELOW  RIGHT EYE SCC IN SITU TX CX3 5FU  . SCC (squamous cell carcinoma) 08/04/2014   LEFT CHEEK SCC IN SITU TX CX3 5FU  . SCC (squamous cell carcinoma) 11/27/2018   LEFT TEMPLE SCC IN SITU TX WITH BX  . SCC (squamous cell carcinoma)   . SCC (squamous cell carcinoma)   . SCC (squamous cell carcinoma)   . SCC (squamous cell carcinoma) Well Diff 09/13/2016   Tip of Nose SCC WELL DIFF TX (MOH's), and RIGHT INNER EYE SUP. SCC IN SITU TX TO WATCH  . SCC (squamous cell carcinoma) Well Diff 05/30/2017   Right Cheekbone (Cx3,5FU) and Under Left Eye (Cx3,5FU)  . Squamous cell carcinoma in situ (SCCIS) 04/12/2005   Right Neck Inf (Cx3),  Right Neck Sup (Exc), and Bulb of Nose (Cx3,Exc)  . Squamous cell carcinoma in situ (SCCIS) 09/27/2005   Nose (Cx3,Exc)  . Squamous cell carcinoma in situ (SCCIS) 02/18/2013   Below Left Eye (tx p bx)  . Squamous cell carcinoma in situ (SCCIS) 04/16/2013   Left Temple (Cx3,5FU), Below Left Eye (Cx3,5FU), Below Right Eye (Cx3,5FU)  . Squamous cell carcinoma in situ (SCCIS) 08/04/2014   Left Cheek (Cx3,5FU)  . Squamous cell carcinoma in situ (SCCIS) 09/13/2016   Right Inner Eye Sup. ([place])  . Squamous cell carcinoma in situ (SCCIS) Focal 08/24/2008   Right Outer Brow (tx p bx) and Below Left Eye (Cx3,5FU)  . Squamous cell carcinoma in situ (SCCIS) Hypertrophic 10/25/2006   Right Elbow (Cx3,5FU)  . Squamous cell carcinoma of skin 09/27/2005   NOSE SCC IN SITU TC CX3 5FU  . 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 Type 2 diabetes mellitus with vascular disease ([place]) 05/21/2007   Qualifier: Diagnosis of  By: Diona Browner MD, [name]    . Type 2 DM with CKD stage 3 and hypertension ([place]) 07/16/2015  . Unspecified essential hypertension     Past Surgical History:  Procedure Laterality Date  . HERNIA REPAIR    . LEFT HEART CATHETERIZATION WITH CORONARY ANGIOGRAM N/A 09/13/2012   Procedure: LEFT HEART CATHETERIZATION WITH CORONARY ANGIOGRAM;  Surgeon: Sherren Mocha, MD;   Location[facility] CATH LAB;  Service: Cardiovascular;  Laterality: N/A;  . LOBECTOMY  2001   upper left  . PERICARDIAL TAP N/A 09/16/2012   Procedure: PERICARDIAL TAP;  Surgeon: Sinclair Grooms, MD;  Location: [facility] CATH LAB;  Service: Cardiovascular;  Laterality: N/A;  . PROSTATECTOMY  2003  . SUBXYPHOID PERICARDIAL WINDOW N/A 09/16/2012   Procedure: SUBXYPHOID PERICARDIAL WINDOW;  Surgeon: Rexene Alberts, MD;  Location: [facility] OR;  Service: Thoracic;  Laterality: N/A;  . TONSILLECTOMY      Family History  Problem Relation Age of Onset  . Cancer Father        colon  . Dementia Mother   . Diabetes Mother   . Cancer Brother        lung    Past Medical History, Surgical History, Social History, Family History, Problem List, Medications, and Allergies have been reviewed and updated if relevant.  Review of Systems: Pertinent positives are listed above.  Otherwise, a full 14 point review of systems has been done in full and it is negative except where it is noted positive.  Objective:   BP 114/64 (BP Location: Left Arm, Patient Position: Sitting, Cuff Size: Large)   Pulse 73   Temp 98 F (36.7 C)   Ht 5\' 9"  (1.753 m)   Wt 180 lb (81.6 kg)   SpO2 96%   BMI 26.58 kg/m  Fall Risk  12/26/2019 09/03/2018 08/30/2017 08/15/2016 01/15/2015  Falls in the past year? 0 0 No No No  Number falls in past yr: 0 - - - -   Ideal Body Weight: Weight in (lb) to have BMI = 25: 168.9  Hearing Screening   125Hz  250Hz  500Hz  1000Hz  2000Hz  3000Hz  4000Hz  6000Hz  8000Hz   Right ear:           Left ear:           Comments: Has hearing aids. Wearing them today.  Vision Screening Comments: September 2021 Depression screen [facility] 2/9 12/26/2019 09/03/2018 08/30/2017 08/15/2016 01/15/2015  Decreased Interest 0 0 0 0 0  Down, Depressed, Hopeless 1 0 0 0 0  PHQ - 2 Score 1 0 0 0 0  Altered sleeping - 0 0 - -  Tired, decreased energy - 0 0 - -  Change in appetite - 0 0 - -  Feeling bad or failure about yourself  - 0 0 - -    Trouble concentrating - 0 0 - -  Moving slowly or fidgety/restless - 0 0 - -  Suicidal thoughts - 0 0 - -  PHQ-9 Score - 0 0 - -  Difficult doing work/chores - Not difficult at all Not difficult at all - -  Some recent data might be hidden     GEN: well developed, well nourished, no acute distress Eyes: conjunctiva and lids normal, PERRLA, EOMI ENT: TM clear, nares clear, oral exam WNL Neck: supple, no lymphadenopathy, no thyromegaly, no JVD Pulm: clear to auscultation and percussion, respiratory effort normal CV: regular rate and rhythm, S1-S2, no murmur, rub or gallop, no bruits, peripheral pulses normal and symmetric, no cyanosis, clubbing, edema or varicosities GI: soft, non-tender; no hepatosplenomegaly, masses; active bowel sounds all quadrants GU: no hernia, testicular mass, penile discharge Lymph: no cervical, axillary or inguinal adenopathy MSK: gait normal, muscle tone and strength WNL, no joint swelling, effusions, discoloration, crepitus  SKIN: clear, good turgor, color WNL, no rashes, lesions, or ulcerations Neuro: normal mental status, normal strength, sensation, and motion Psych: alert; oriented to person, place and time, normally interactive and not anxious or depressed in appearance.  All labs reviewed with patient.  Results for orders placed or performed in visit on 12/20/19  PSA, Medicare  Result Value Ref Range   PSA 0.00 Repeated and verified X2. (L) 0.10 - 4.00 ng/ml  Microalbumin / creatinine urine ratio  Result Value Ref Range   Microalb, Ur 7.8 (H) 0.0 - 1.9 mg/dL   Creatinine,U 446.9 mg/dL   Microalb Creat Ratio 1.7 0.0 - 30.0 mg/g  Hemoglobin A1c  Result Value Ref Range   Hgb A1c MFr Bld 7.0 (H) 4.6 - 6.5 %  CBC with Differential/Platelet  Result Value Ref Range   WBC 6.4 4.0 - 10.5 K/uL   RBC 3.96 (L) 4.22 - 5.81 Mil/uL   Hemoglobin 13.3 13.0 - 17.0 g/dL   HCT 39.5 39 - 52 %   MCV 99.8 78.0 - 100.0 fl   MCHC 33.7 30.0 - 36.0 g/dL   RDW 13.2  11.5 - 15.5 %   Platelets 149.0 (L) 150 - 400 K/uL   Neutrophils Relative % 62.5 43 - 77 %   Lymphocytes Relative 13.9 12 - 46 %   Monocytes Relative 21.5 Repeated and verified X2. (H) 3 - 12 %   Eosinophils Relative 1.7 0 - 5 %   Basophils Relative 0.4 0 - 3 %   Neutro Abs 4.0 1.4 - 7.7 K/uL   Lymphs Abs 0.9 0.7 - 4.0 K/uL   Monocytes Absolute 1.4 (H) 0 - 1 K/uL   Eosinophils Absolute 0.1 0 - 0 K/uL   Basophils Absolute 0.0 0 - 0 K/uL  Basic metabolic panel  Result Value Ref Range   Sodium 139 135 - 145 mEq/L   Potassium 5.2 No hemolysis seen (H) 3.5 - 5.1 mEq/L   Chloride 103 96 - 112 mEq/L   CO2 29 19 - 32 mEq/L   Glucose, Bld 198 (H) 70 - 99 mg/dL   BUN 22 6 - 23 mg/dL   Creatinine, Ser 1.47 0.40 - 1.50 mg/dL   GFR 45.30 (L) >60.00 mL/min   Calcium  8.8 8.4 - 10.5 mg/dL  Hepatic function panel  Result Value Ref Range   Total Bilirubin 0.6 0.2 - 1.2 mg/dL   Bilirubin, Direct 0.1 0.0 - 0.3 mg/dL   Alkaline Phosphatase 75 39 - 117 U/L   AST 10 0 - 37 U/L   ALT 8 0 - 53 U/L   Total Protein 6.6 6.0 - 8.3 g/dL   Albumin 4.3 3.5 - 5.2 g/dL  Lipid panel  Result Value Ref Range   Cholesterol 189 0 - 200 mg/dL   Triglycerides 260.0 (H) 0 - 149 mg/dL   HDL 29.90 (L) >39.00 mg/dL   VLDL 52.0 (H) 0.0 - 40.0 mg/dL   Total CHOL/HDL Ratio 6    NonHDL 159.50   LDL cholesterol, direct  Result Value Ref Range   Direct LDL 130.0 mg/dL    Assessment and Plan:     ICD-10-CM   1. Healthcare maintenance  Z00.00   2. Need for influenza vaccination  Z23 Flu Vaccine QUAD High Dose(Fluad)   He is doing well and very functional at age 22.  With his depression I did ask him to increase his Zoloft to 100 mg a day.  He is going to follow-up with me in about 2 months.  Health Maintenance Exam: The patient's preventative maintenance and recommended screening tests for an annual wellness exam were reviewed in full today. Brought up to date unless services declined.  Counselled on the  importance of diet, exercise, and its role in overall health and mortality. The patient's FH and SH was reviewed, including their home life, tobacco status, and drug and alcohol status.  Follow-up in 1 year for physical exam or additional follow-up below.  I have personally reviewed the Medicare Annual Wellness questionnaire and have noted 1. The patient's medical and social history 2. Their use of alcohol, tobacco or illicit drugs 3. Their current medications and supplements 4. The patient's functional ability including ADL's, fall risks, home safety risks and hearing or visual             impairment. 5. Diet and physical activities 6. Evidence for depression or mood disorders 7. Reviewed Updated provider list, see scanned forms and [facility] Snapshot.  8. Reviewed whether or not the patient has HCPOA or living will, and discussed what this means with the patient.  Recommended he bring in a copy for his chart in [facility].  The patients weight, height, BMI and visual acuity have been recorded in the chart I have made referrals, counseling and provided education to the patient based review of the above and I have provided the pt with a written personalized care plan for preventive services.  I have provided the patient with a copy of your personalized plan for preventive services. Instructed to take the time to review along with their updated medication list.  Follow-up: Return for follow-up in 2 1/2 months. Or follow-up in 1 year if not noted.  Future Appointments  Date Time Provider [place]  02/06/2020  2:00 PM Gardiner Barefoot, DPM [facility]  02/24/2020  1:30 PM [facility] PHARMACIST [facility]    Meds ordered this encounter  Medications  . sertraline (ZOLOFT) 100 MG tablet    Sig: Take 1 tablet (100 mg total) by mouth daily.    Dispense:  30 tablet    Refill:  3   Medications Discontinued During This Encounter  Medication Reason  . sertraline (ZOLOFT) 50 MG tablet     Orders Placed This Encounter  Procedures  . Flu Vaccine QUAD High Dose(Fluad)    Signed,  Wenceslao Loper T. Jaylise Peek, MD   Allergies as of 12/26/2019      Reactions   Sulfa Antibiotics Nausea Only      Medication List       Accurate as of December 26, 2019 11:59 PM. If you have any questions, ask your nurse or doctor.        aspirin EC 81 MG tablet Take 81 mg by mouth daily.   atorvastatin 40 MG tablet Commonly known as: LIPITOR TAKE 1 TABLET BY MOUTH EVERY DAY   Besivance 0.6 % Susp Generic drug: Besifloxacin HCl Apply to eye.   diphenhydrAMINE 25 MG tablet Commonly known as: BENADRYL Take 25 mg by mouth 2 (two) times daily.   donepezil 10 MG tablet Commonly known as: ARICEPT TAKE 1 TABLET BY MOUTH EVERYDAY AT BEDTIME   Fish Oil 1000 MG Caps Take 1,000 mg by mouth at bedtime.   glipiZIDE 5 MG tablet Commonly known as: GLUCOTROL TAKE 1 TABLET BY MOUTH EVERY DAY BEFORE BREAKFAST   ICAPS LUTEIN & ZEAXANTHIN PO Take 1 capsule by mouth 2 (two) times daily.   metFORMIN 500 MG 24 hr tablet Commonly known as: GLUCOPHAGE-XR TAKE 1 TABLET BY MOUTH TWICE A DAY   sertraline 100 MG tablet Commonly known as: ZOLOFT Take 1 tablet (100 mg total) by mouth daily. What changed:   medication strength  how much to take Changed by: [name], MD   triamcinolone cream 0.1 % Commonly known as: KENALOG Apply 1 application topically 2 (two) times daily.

## 2019-12-26 NOTE — Progress Notes (Signed)
Hearing Screening   125Hz  250Hz  500Hz  1000Hz  2000Hz  3000Hz  4000Hz  6000Hz  8000Hz   Right ear:           Left ear:           Comments: Has hearing aids. Wearing them today.  Vision Screening Comments: September 2021

## 2019-12-28 ENCOUNTER — Encounter: Payer: Self-pay | Admitting: Family Medicine

## 2020-01-02 DIAGNOSIS — H353221 Exudative age-related macular degeneration, left eye, with active choroidal neovascularization: Secondary | ICD-10-CM | POA: Diagnosis not present

## 2020-01-15 ENCOUNTER — Other Ambulatory Visit: Payer: Self-pay | Admitting: Family Medicine

## 2020-01-16 DIAGNOSIS — H353231 Exudative age-related macular degeneration, bilateral, with active choroidal neovascularization: Secondary | ICD-10-CM | POA: Diagnosis not present

## 2020-01-19 ENCOUNTER — Other Ambulatory Visit: Payer: Self-pay | Admitting: Family Medicine

## 2020-01-20 DIAGNOSIS — G309 Alzheimer's disease, unspecified: Secondary | ICD-10-CM | POA: Diagnosis not present

## 2020-01-20 DIAGNOSIS — Z7984 Long term (current) use of oral hypoglycemic drugs: Secondary | ICD-10-CM | POA: Diagnosis not present

## 2020-01-20 DIAGNOSIS — R030 Elevated blood-pressure reading, without diagnosis of hypertension: Secondary | ICD-10-CM | POA: Diagnosis not present

## 2020-01-20 DIAGNOSIS — F411 Generalized anxiety disorder: Secondary | ICD-10-CM | POA: Diagnosis not present

## 2020-01-20 DIAGNOSIS — N529 Male erectile dysfunction, unspecified: Secondary | ICD-10-CM | POA: Diagnosis not present

## 2020-01-20 DIAGNOSIS — N393 Stress incontinence (female) (male): Secondary | ICD-10-CM | POA: Diagnosis not present

## 2020-01-20 DIAGNOSIS — R69 Illness, unspecified: Secondary | ICD-10-CM | POA: Diagnosis not present

## 2020-01-20 DIAGNOSIS — E119 Type 2 diabetes mellitus without complications: Secondary | ICD-10-CM | POA: Diagnosis not present

## 2020-01-20 DIAGNOSIS — Z7982 Long term (current) use of aspirin: Secondary | ICD-10-CM | POA: Diagnosis not present

## 2020-01-20 DIAGNOSIS — I951 Orthostatic hypotension: Secondary | ICD-10-CM | POA: Diagnosis not present

## 2020-01-21 NOTE — Telephone Encounter (Signed)
Last OV 12/26/19 Last fill 12/26/19  #30/3 #30/3 refills add up to a 90 day supply. Please advise.

## 2020-01-30 DIAGNOSIS — H353221 Exudative age-related macular degeneration, left eye, with active choroidal neovascularization: Secondary | ICD-10-CM | POA: Diagnosis not present

## 2020-02-06 ENCOUNTER — Other Ambulatory Visit: Payer: Self-pay

## 2020-02-06 ENCOUNTER — Ambulatory Visit: Payer: Medicare HMO | Admitting: Podiatry

## 2020-02-06 ENCOUNTER — Encounter: Payer: Self-pay | Admitting: Podiatry

## 2020-02-06 DIAGNOSIS — B351 Tinea unguium: Secondary | ICD-10-CM

## 2020-02-06 DIAGNOSIS — E1151 Type 2 diabetes mellitus with diabetic peripheral angiopathy without gangrene: Secondary | ICD-10-CM | POA: Diagnosis not present

## 2020-02-06 DIAGNOSIS — M79675 Pain in left toe(s): Secondary | ICD-10-CM

## 2020-02-06 DIAGNOSIS — M79674 Pain in right toe(s): Secondary | ICD-10-CM

## 2020-02-06 NOTE — Progress Notes (Signed)
This patient returns to my office for at risk foot care.  This patient requires this care by a professional since this patient will be at risk due to having diabetes with vascular disease.  Patient also has chronic kidney disease.       This patient is unable to cut nails himself since the patient cannot reach his nails.These nails are painful walking and wearing shoes.  This patient presents for at risk foot care today.  General Appearance  Alert, conversant and in no acute stress.  Vascular  Dorsalis pedis  pulses are weakly  palpable  bilaterally. Posterior tibial pulses are absent  B/L.   Capillary return is within normal limits  bilaterally. Cold feet   bilaterally.  Neurologic  Senn-Weinstein monofilament wire test within normal limits  bilaterally. Muscle power within normal limits bilaterally.  Nails Thick disfigured discolored nails with subungual debris  from hallux to fifth toes bilaterally. No evidence of bacterial infection or drainage bilaterally.  Orthopedic  No limitations of motion  feet .  No crepitus or effusions noted.  No bony pathology or digital deformities noted.  Skin  normotropic skin with no porokeratosis noted bilaterally.  No signs of infections or ulcers noted.     Onychomycosis  Pain in right toes  Pain in left toes  Consent was obtained for treatment procedures.   Mechanical debridement of nails 1-5  bilaterally performed with a nail nipper.  Filed with dremel without incident.    Return office visit  3 months                   Told patient to return for periodic foot care and evaluation due to potential at risk complications.   Gardiner Barefoot DPM

## 2020-02-20 DIAGNOSIS — H353221 Exudative age-related macular degeneration, left eye, with active choroidal neovascularization: Secondary | ICD-10-CM | POA: Diagnosis not present

## 2020-02-24 ENCOUNTER — Other Ambulatory Visit: Payer: Self-pay

## 2020-02-24 ENCOUNTER — Ambulatory Visit: Payer: Medicare HMO

## 2020-02-24 DIAGNOSIS — E7800 Pure hypercholesterolemia, unspecified: Secondary | ICD-10-CM

## 2020-02-24 DIAGNOSIS — F411 Generalized anxiety disorder: Secondary | ICD-10-CM

## 2020-02-24 DIAGNOSIS — E1159 Type 2 diabetes mellitus with other circulatory complications: Secondary | ICD-10-CM

## 2020-02-24 NOTE — Chronic Care Management (AMB) (Signed)
Chronic Care Management Pharmacy  Name: [name]  MRN: [id number] DOB: 1931/03/12   Chief Complaint/ HPI  [name],  86 y.o., male presents for their Follow-Up CCM visit with the clinical pharmacist via telephone.  PCP : [name], MD  Their chronic conditions include: type 2 diabetes, hypertension, CAD, alzheimer's disease, left lung cancer stage 1, hypercholesterolemia, GAD, vertigo, history of tobacco abuse  Patient concerns: denies medication concerns  Office Visits:  12/26/19: [facility] - Zoloft at 50 mg - increase to 100 mg   11/07/18: [facility] - cont current meds  Consult Visit:  07/22/19: neoplasm of skin biopsy  07/02/28: pain due to onychomycosis - mechanical debridement of nails 1-5, rtc 3 months  Allergies  Allergen Reactions  . Sulfa Antibiotics Nausea Only   Medications: Outpatient Encounter Medications as of 02/24/2020  Medication Sig  . aspirin EC 81 MG tablet Take 81 mg by mouth daily.  . diphenhydrAMINE (BENADRYL) 25 MG tablet Take 25 mg by mouth 2 (two) times daily.  Marland Kitchen donepezil (ARICEPT) 10 MG tablet TAKE 1 TABLET BY MOUTH EVERYDAY AT BEDTIME  . glipiZIDE (GLUCOTROL) 5 MG tablet TAKE 1 TABLET BY MOUTH EVERY DAY BEFORE BREAKFAST  . metFORMIN (GLUCOPHAGE-XR) 500 MG 24 hr tablet TAKE 1 TABLET BY MOUTH TWICE A DAY (Patient taking differently: 500 mg daily with breakfast. )  . Multiple Vitamins-Minerals (ICAPS LUTEIN & ZEAXANTHIN PO) Take 1 capsule by mouth 2 (two) times daily.   . Omega-3 Fatty Acids (FISH OIL) 1000 MG CAPS Take 1,000 mg by mouth in the morning and at bedtime.   . sertraline (ZOLOFT) 100 MG tablet TAKE 1 TABLET BY MOUTH EVERY DAY  . [DISCONTINUED] donepezil (ARICEPT) 10 MG tablet TAKE 1 TABLET BY MOUTH EVERYDAY AT BEDTIME  . [DISCONTINUED] atorvastatin (LIPITOR) 40 MG tablet TAKE 1 TABLET BY MOUTH EVERY DAY  . [DISCONTINUED] BESIVANCE 0.6 % SUSP Apply to eye.  . [DISCONTINUED] triamcinolone cream (KENALOG) 0.1 % Apply  1 application topically 2 (two) times daily. (Patient not taking: Reported on 02/24/2020)   No facility-administered encounter medications on file as of 02/24/2020.   Current Diagnosis/Assessment:   Emergency planning/management officer Strain: Low Risk   . Difficulty of Paying Living Expenses: Not hard at all   Goals Addressed            This Visit's Progress   . Pharmacy Care Plan       CARE PLAN ENTRY  Current Barriers:  . Chronic Disease Management support, education, and care coordination needs related to Hyperlipidemia, Diabetes, and Anxiety   Hyperlipidemia . Pharmacist Clinical Goal(s): o Over the next 6 months, patient will work with PharmD and providers to maintain LDL goal < 70 . Current regimen:  o Fish oil 1200 mg - 1 capsule daily o Aspirin 81 mg - 1 tablet daily . Interventions: o Removed atorvastatin from medication list  . Patient self care activities - Over the next 6 months, patient will: . Slowly increase exercise with goal of 30 minutes, 5 days per week . Incorporate a healthy diet high in vegetables, fruits and whole grains with low-fat dairy products, chicken, fish, legumes, non-tropical vegetable oils and nuts. Limit intake of sweets, sugar-sweetened beverages and red meats.  Diabetes . Pharmacist Clinical Goal(s): o Over the next 6 months, patient will work with PharmD and providers to maintain A1c goal <7% . Current regimen:  o Metformin 500 mg - 1 tablet daily with breakfast o Glipizide 5 mg - 1 tablet  daily before breakfast . Interventions: o Reviewed medication tolerance and BG log  . Patient self care activities - Over the next 6 months, patient will: o Check blood sugar once daily, document, and provide at future appointments o Contact provider with any episodes of hypoglycemia (blood glucose < 70 mg/dL)  Anxiety . Pharmacist Clinical Goal(s) o Over the next 6 months, patient will work with PharmD and providers to reduce caffeine intake . Current regimen:   o Sertraline 100 mg - 1 tablet daily . Interventions: o Recommend limiting caffeine by using half/half or decaf coffee (currently drinking 3-5 large cups of coffee per day) . Patient self care activities - Over the next 6 months, patient will: o Reduce caffeine intake to see if jitters/anxiety improve  Please see past updates related to this goal by clicking on the "Past Updates" button in the selected goal       Hypertension   CMP Latest Ref Rng & Units 12/20/2019 10/31/2018 06/25/2018  Glucose 70 - 99 mg/dL 198(H) 222(H) 185(H)  BUN 6 - 23 mg/dL '22 13 19  ' Creatinine 0.40 - 1.50 mg/dL 1.47 1.28 1.29(H)  Sodium 135 - 145 mEq/L 139 138 137  Potassium 3.5 - 5.1 mEq/L 5.2 No hemolysis seen(H) 5.0 4.3  Chloride 96 - 112 mEq/L 103 104 99  CO2 19 - 32 mEq/L '29 28 25  ' Calcium 8.4 - 10.5 mg/dL 8.8 9.0 9.6  Total Protein 6.0 - 8.3 g/dL 6.6 6.3 -  Total Bilirubin 0.2 - 1.2 mg/dL 0.6 0.4 -  Alkaline Phos 39 - 117 U/L 75 75 -  AST 0 - 37 U/L 10 10 -  ALT 0 - 53 U/L 8 9 -   Lab Results  Component Value Date   CREATININE 1.47 12/20/2019   BUN 22 12/20/2019   GFR 45.30 (L) 12/20/2019   GFRNONAA 50 (L) 06/25/2018   GFRAA 58 (L) 06/25/2018   NA 139 12/20/2019   K 5.2 No hemolysis seen (H) 12/20/2019   CALCIUM 8.8 12/20/2019   CO2 29 12/20/2019   Office blood pressures are: BP Readings from Last 3 Encounters:  12/26/19 114/64  11/07/18 140/74  06/26/18 (!) 155/80   BP goal < 140/90 mmHg Patient has failed these meds in the past: none reported  Patient is currently controlled on the following medications:   No pharmacotherapy  No update/changes as of 02/24/20  Plan: Continue heart healthy diet and management without medication.  Hyperlipidemia/CAD   Lipid Panel     Component Value Date/Time   CHOL 189 12/20/2019 0920   TRIG 260.0 (H) 12/20/2019 0920   HDL 29.90 (L) 12/20/2019 0920   CHOLHDL 6 12/20/2019 0920   VLDL 52.0 (H) 12/20/2019 0920   LDLCALC 52 10/31/2018 1028    LDLDIRECT 130.0 12/20/2019 0920    LDL goal < 70 Patient has failed these meds in past: atorvastatin - pt self-discontinued  Patient is currently uncontrolled on the following medications:   Fish oil 1000 mg with 360 mg omega-3 fatty acids- 1 capsule daily  Aspirin 81 mg - 1 tablet daily  CBC Latest Ref Rng & Units 12/20/2019 10/31/2018 06/25/2018  WBC 4.0 - 10.5 K/uL 6.4 6.1 11.6(H)  Hemoglobin 13.0 - 17.0 g/dL 13.3 12.5(L) 14.0  Hematocrit 39 - 52 % 39.5 37.8(L) 44.1  Platelets 150 - 400 K/uL 149.0(L) 151.0 135(L)   Update 02/24/20: LDL above goal, no longer on statin. Pt's caregiver, Velta Addison, reports pt did not want to take statin and discussed with PCP,  they agreed okay to discontinue due to age. No longer on med list.   Plan: Continue current medications  Diabetes   Recent Relevant Labs: Lab Results  Component Value Date/Time   HGBA1C 7.0 (H) 12/20/2019 09:20 AM   HGBA1C 6.9 (H) 10/31/2018 10:28 AM   EGFR 44 (L) 07/30/2015 09:57 AM   MICROALBUR 7.8 (H) 12/20/2019 09:20 AM   MICROALBUR 3.4 (H) 10/31/2018 10:39 AM    A1c goal < 7% Patient has failed these meds in past: none  Patient is currently controlled on the following medications:   Glipizide 5 mg - 1 tablet daily before breakfast  Metformin 500 mg - 1 tablet daily (only able to tolerate 500 mg daily due to diarrhea)  Last diabetic eye exam:  Lab Results  Component Value Date/Time   HMDIABEYEEXA Retinopathy (A) 04/24/2019 12:00 AM    Last diabetic foot exam: per PCP annual visit 11/2018   Update 02/24/20: BG ranging 150-180s, checking BG once weekly at various times. A1c within goal. Denies hypoglycemia.   Plan: Continue current medications   Alzheimers   Patient has failed these meds in past: none reported Patient is currently controlled on the following medications:   Donepezil 10 mg - 1 tablet daily at bedtime  We discussed: overall doing well, denies adverse effects   Update 02/24/20: Encouraged to  discontinue or cut back on Benadryl due to memory impairment. Caregiver reports trying Claritin and Zyrtec in the last 2 weeks with no efficacy. Wants to continue Benadryl due to chronic runny nose.  Plan: Continue current medications   GAD   Patient has failed these meds in past: none reported Patient is currently controlled on the following medications:   Sertraline 100 mg - 1 tablet daily  Update 02/24/20: doing better with dose increase, recommend continuing to limit caffeine which has worsened his anxiety in the past   Plan: Continue current medications  Vaccines   Reviewed and discussed patient's vaccination history.    Immunization History  Administered Date(s) Administered  . Fluad Quad(high Dose 65+) 12/26/2019  . Influenza Whole 01/03/2007  . Influenza, High Dose Seasonal PF 01/19/2018, 03/06/2019  . Influenza,inj,Quad PF,6+ Mos 02/06/2013, 01/20/2014, 01/15/2015, 01/29/2016  . Influenza-Unspecified 01/02/2017  . Moderna SARS-COVID-2 Vaccination 06/03/2019, 07/04/2019  . Pneumococcal Conjugate-13 01/20/2014  . Pneumococcal Polysaccharide-23 01/02/2001  . Td 04/04/1994  . Tdap 01/25/2013   Plan: Recommend patient receive Shingrix   Medication Management  Misc: triamcinolone cream 0.1%  OTCs: multivitamin with lutein, diphenhydramine 25 mg - BID   Pharmacy/Benefits: [facility]  Adherence: atorvastatin, removed from med list  Social support: friend, Velta Addison, assists with medications, fills pillbox weekly  Affordability: denies concerns  CCM Follow Up: 6 months, telephone  [name], PharmD Clinical Pharmacist [facility] at [facility] [phone]

## 2020-02-25 ENCOUNTER — Other Ambulatory Visit: Payer: Self-pay | Admitting: Family Medicine

## 2020-02-25 NOTE — Telephone Encounter (Signed)
Last office visit 12/26/2019 for CPE.  AVS states to follow up in 2 1/2 months.  No future appointments.  Last refilled 12/02/2019 for #90 with no refills.  Ok to refill?

## 2020-02-29 DIAGNOSIS — E119 Type 2 diabetes mellitus without complications: Secondary | ICD-10-CM | POA: Diagnosis not present

## 2020-03-02 DIAGNOSIS — H353221 Exudative age-related macular degeneration, left eye, with active choroidal neovascularization: Secondary | ICD-10-CM | POA: Diagnosis not present

## 2020-03-03 NOTE — Patient Instructions (Addendum)
Dear [name],  Below is a summary of the goals we discussed during our follow up appointment on March 03, 2020. Please contact me anytime with questions or concerns.   Visit Information  Goals Addressed            This Visit's Progress   . Pharmacy Care Plan       CARE PLAN ENTRY  Current Barriers:  . Chronic Disease Management support, education, and care coordination needs related to Hyperlipidemia, Diabetes, and Anxiety   Hyperlipidemia . Pharmacist Clinical Goal(s): o Over the next 6 months, patient will work with PharmD and providers to maintain LDL goal < 70 . Current regimen:  o Fish oil 1200 mg - 1 capsule daily o Aspirin 81 mg - 1 tablet daily . Interventions: o Removed atorvastatin from medication list  . Patient self care activities - Over the next 6 months, patient will: . Slowly increase exercise with goal of 30 minutes, 5 days per week . Incorporate a healthy diet high in vegetables, fruits and whole grains with low-fat dairy products, chicken, fish, legumes, non-tropical vegetable oils and nuts. Limit intake of sweets, sugar-sweetened beverages and red meats.  Diabetes . Pharmacist Clinical Goal(s): o Over the next 6 months, patient will work with PharmD and providers to maintain A1c goal <7% . Current regimen:  o Metformin 500 mg - 1 tablet daily with breakfast o Glipizide 5 mg - 1 tablet daily before breakfast . Interventions: o Reviewed medication tolerance and BG log  . Patient self care activities - Over the next 6 months, patient will: o Check blood sugar once daily, document, and provide at future appointments o Contact provider with any episodes of hypoglycemia (blood glucose < 70 mg/dL)  Anxiety . Pharmacist Clinical Goal(s) o Over the next 6 months, patient will work with PharmD and providers to reduce caffeine intake . Current regimen:  o Sertraline 100 mg - 1 tablet daily . Interventions: o Recommend limiting caffeine by using  half/half or decaf coffee (currently drinking 3-5 large cups of coffee per day) . Patient self care activities - Over the next 6 months, patient will: o Reduce caffeine intake to see if jitters/anxiety improve  Please see past updates related to this goal by clicking on the "Past Updates" button in the selected goal        The patient verbalized understanding of instructions, educational materials, and care plan provided today and declined offer to receive copy of patient instructions, educational materials, and care plan.   Telephone follow up appointment with pharmacy team member scheduled for: 09/03/20 at 11 AM (telephone)   [name], PharmD Clinical Pharmacist [facility] at [facility] [phone]   Exercises To Do While Sitting  Exercises that you do while sitting (chair exercises) can give you many of the same benefits as full exercise. Benefits include strengthening your heart, burning calories, and keeping muscles and joints healthy. Exercise can also improve your mood and help with depression and anxiety. You may benefit from chair exercises if you are unable to do standing exercises because of:  Diabetic foot pain.  Obesity.  Illness.  Arthritis.  Recovery from surgery or injury.  Breathing problems.  Balance problems.  Another type of disability. Before starting chair exercises, check with your health care provider or a physical therapist to find out how much exercise you can tolerate and which exercises are safe for you. If your health care provider approves:  Start out slowly and build up  over time. Aim to work up to about 10-20 minutes for each exercise session.  Make exercise part of your daily routine.  Drink water when you exercise. Do not wait until you are thirsty. Drink every 10-15 minutes.  Stop exercising right away if you have pain, nausea, shortness of breath, or dizziness.  If you are exercising in a wheelchair, make sure to  lock the wheels.  Ask your health care provider whether you can do tai chi or yoga. Many positions in these mind-body exercises can be modified to do while seated. Warm-up Before starting other exercises: 1. Sit up as straight as you can. Have your knees bent at 90 degrees, which is the shape of the capital letter "L." Keep your feet flat on the floor. 2. Sit at the front edge of your chair, if you can. 3. Pull in (tighten) the muscles in your abdomen and stretch your spine and neck as straight as you can. Hold this position for a few minutes. 4. Breathe in and out evenly. Try to concentrate on your breathing, and relax your mind. Stretching Exercise A: Arm stretch 1. Hold your arms out straight in front of your body. 2. Bend your hands at the wrist with your fingers pointing up, as if signaling someone to stop. Notice the slight tension in your forearms as you hold the position. 3. Keeping your arms out and your hands bent, rotate your hands outward as far as you can and hold this stretch. Aim to have your thumbs pointing up and your pinkie fingers pointing down. Slowly repeat arm stretches for one minute as tolerated. Exercise B: Leg stretch 1. If you can move your legs, try to "draw" letters on the floor with the toes of your foot. Write your name with one foot. 2. Write your name with the toes of your other foot. Slowly repeat the movements for one minute as tolerated. Exercise C: Reach for the sky 1. Reach your hands as far over your head as you can to stretch your spine. 2. Move your hands and arms as if you are climbing a rope. Slowly repeat the movements for one minute as tolerated. Range of motion exercises Exercise A: Shoulder roll 1. Let your arms hang loosely at your sides. 2. Lift just your shoulders up toward your ears, then let them relax back down. 3. When your shoulders feel loose, rotate your shoulders in backward and forward circles. Do shoulder rolls slowly for one  minute as tolerated. Exercise B: March in place 1. As if you are marching, pump your arms and lift your legs up and down. Lift your knees as high as you can. ? If you are unable to lift your knees, just pump your arms and move your ankles and feet up and down. March in place for one minute as tolerated. Exercise C: Seated jumping jacks 1. Let your arms hang down straight. 2. Keeping your arms straight, lift them up over your head. Aim to point your fingers to the ceiling. 3. While you lift your arms, straighten your legs and slide your heels along the floor to your sides, as wide as you can. 4. As you bring your arms back down to your sides, slide your legs back together. ? If you are unable to use your legs, just move your arms. Slowly repeat seated jumping jacks for one minute as tolerated. Strengthening exercises Exercise A: Shoulder squeeze 1. Hold your arms straight out from your body to your sides, with your  elbows bent and your fists pointed at the ceiling. 2. Keeping your arms in the bent position, move them forward so your elbows and forearms meet in front of your face. 3. Open your arms back out as wide as you can with your elbows still bent, until you feel your shoulder blades squeezing together. Hold for 5 seconds. Slowly repeat the movements forward and backward for one minute as tolerated. Contact a health care provider if you:  Had to stop exercising due to any of the following: ? Pain. ? Nausea. ? Shortness of breath. ? Dizziness. ? Fatigue.  Have significant pain or soreness after exercising. Get help right away if you have:  Chest pain.  Difficulty breathing. These symptoms may represent a serious problem that is an emergency. Do not wait to see if the symptoms will go away. Get medical help right away. Call your local emergency services (911 in the [place] Do not drive yourself to the hospital. This information is not intended to replace advice given to you by your  health care provider. Make sure you discuss any questions you have with your health care provider. Document Revised: 07/12/2018 Document Reviewed: 02/01/2017 Elsevier Patient Education  2020 [employer].

## 2020-03-04 NOTE — Progress Notes (Signed)
I have collaborated with the care management provider regarding care management and care coordination activities outlined in this encounter and have reviewed this encounter including documentation in the note and care plan. I am certifying that I agree with the content of this note and encounter as supervising physician.  

## 2020-03-31 DIAGNOSIS — H353221 Exudative age-related macular degeneration, left eye, with active choroidal neovascularization: Secondary | ICD-10-CM | POA: Diagnosis not present

## 2020-04-02 DIAGNOSIS — H353231 Exudative age-related macular degeneration, bilateral, with active choroidal neovascularization: Secondary | ICD-10-CM | POA: Diagnosis not present

## 2020-04-30 DIAGNOSIS — H353221 Exudative age-related macular degeneration, left eye, with active choroidal neovascularization: Secondary | ICD-10-CM | POA: Diagnosis not present

## 2020-05-10 NOTE — Progress Notes (Signed)
Ethan Devoss T. Tariyah Pendry, MD, [name] at [facility] [place], [address]  Phone: [phone]  FAX: [phone]  [name] - 87 y.o. male  MRN [id number]  Date of Birth: Aug 08, 1930  Date: 05/11/2020  PCP: [name], MD  Referral: [name], MD  Chief Complaint  Patient presents with  . Gas    Stomach has been growling/roaring    This visit occurred during the SARS-CoV-2 public health emergency.  Safety protocols were in place, including screening questions prior to the visit, additional usage of staff PPE, and extensive cleaning of exam room while observing appropriate contact time as indicated for disinfecting solutions.   Subjective:   [name] is a 87 y.o. very pleasant male patient with Body mass index is 27.1 kg/m. who presents with the following:  He presents for evaluation of some abdominal issues.  He basically has been feeling gassy, and he has his wife think that his abdominal sounds are more pronounced.  Does not really have any significant pain per his report.  He is having good bowel movements, he does not have any black tarry stools or any bloody stools.  He generally goes to the bathroom 1 time a day.  He is also not have any other difficulties with urination.  Has had a lot of gas and roaring in his belly.   Tried some little - probiotics.  No pain.    Once a day - no blood or melana.     Review of Systems is noted in the HPI, as appropriate  Objective:   BP 140/66   Pulse 63   Temp 97.7 F (36.5 C) (Temporal)   Ht 5\' 9"  (1.753 m)   Wt 183 lb 8 oz (83.2 kg)   SpO2 98%   BMI 27.10 kg/m   GEN: No acute distress; alert,appropriate. PULM: Breathing comfortably in no respiratory distress PSYCH: Normally interactive.  ABD: S, NT, ND, + BS, No rebound, No HSM  CV: RRR, no m/g/r   Laboratory and Imaging Data:  Assessment and  Plan:     ICD-10-CM   1. Sensation of gaseous abdominal fullness  R14.0 H. pylori antibody, IgG  2. Type 2 diabetes mellitus with vascular disease (HCC)  T51.76 Basic metabolic panel    Hemoglobin A1c    H. pylori antibody, IgG  3. Encounter for long-term (current) use of medications  H60.737 Basic metabolic panel    CBC with Differential/Platelet    Hepatic function panel    H. pylori antibody, IgG   His exam is pretty benign.  I am going to check some basic laboratories including an H. pylori.  For now I am can have him do some Gas-X, and we will see if this helps. If symptoms persist, then additional work-up could be undertaken.  I am also going to reevaluate basic labs and he is A1c.  His wife did help with history, and prior notes also reviewed.  No orders of the defined types were placed in this encounter.  There are no discontinued medications. Orders Placed This Encounter  Procedures  . Basic metabolic panel  . CBC with Differential/Platelet  . Hepatic function panel  . Hemoglobin A1c  . H. pylori antibody, IgG    Follow-up: No follow-ups on file.  Signed,  [name]. Edgardo Petrenko, MD   Outpatient Encounter Medications as of 05/11/2020  Medication Sig  . aspirin EC 81  MG tablet Take 81 mg by mouth daily.  . diphenhydrAMINE (BENADRYL) 25 MG tablet Take 25 mg by mouth 2 (two) times daily.  Marland Kitchen donepezil (ARICEPT) 10 MG tablet TAKE 1 TABLET BY MOUTH EVERYDAY AT BEDTIME  . glipiZIDE (GLUCOTROL) 5 MG tablet TAKE 1 TABLET BY MOUTH EVERY DAY BEFORE BREAKFAST  . metFORMIN (GLUCOPHAGE-XR) 500 MG 24 hr tablet TAKE 1 TABLET BY MOUTH TWICE A DAY  . Multiple Vitamins-Minerals (ICAPS LUTEIN & ZEAXANTHIN PO) Take 1 capsule by mouth 2 (two) times daily.   . Omega-3 Fatty Acids (FISH OIL) 1000 MG CAPS Take 1,000 mg by mouth in the morning and at bedtime.   . sertraline (ZOLOFT) 100 MG tablet TAKE 1 TABLET BY MOUTH EVERY DAY   No facility-administered encounter medications on file as  of 05/11/2020.

## 2020-05-11 ENCOUNTER — Ambulatory Visit (INDEPENDENT_AMBULATORY_CARE_PROVIDER_SITE_OTHER): Payer: Medicare HMO | Admitting: Family Medicine

## 2020-05-11 ENCOUNTER — Encounter: Payer: Self-pay | Admitting: Family Medicine

## 2020-05-11 ENCOUNTER — Other Ambulatory Visit: Payer: Self-pay

## 2020-05-11 ENCOUNTER — Telehealth: Payer: Self-pay | Admitting: Family Medicine

## 2020-05-11 ENCOUNTER — Ambulatory Visit: Payer: Medicare HMO | Admitting: Podiatry

## 2020-05-11 VITALS — BP 140/66 | HR 63 | Temp 97.7°F | Ht 69.0 in | Wt 183.5 lb

## 2020-05-11 DIAGNOSIS — Z79899 Other long term (current) drug therapy: Secondary | ICD-10-CM | POA: Diagnosis not present

## 2020-05-11 DIAGNOSIS — E1159 Type 2 diabetes mellitus with other circulatory complications: Secondary | ICD-10-CM

## 2020-05-11 DIAGNOSIS — E875 Hyperkalemia: Secondary | ICD-10-CM

## 2020-05-11 DIAGNOSIS — R140 Abdominal distension (gaseous): Secondary | ICD-10-CM | POA: Diagnosis not present

## 2020-05-11 DIAGNOSIS — D72821 Monocytosis (symptomatic): Secondary | ICD-10-CM

## 2020-05-11 LAB — CBC WITH DIFFERENTIAL/PLATELET
Basophils Absolute: 0 10*3/uL (ref 0.0–0.1)
Basophils Relative: 0.6 % (ref 0.0–3.0)
Eosinophils Absolute: 0.1 10*3/uL (ref 0.0–0.7)
Eosinophils Relative: 1.6 % (ref 0.0–5.0)
HCT: 39 % (ref 39.0–52.0)
Hemoglobin: 13.2 g/dL (ref 13.0–17.0)
Lymphocytes Relative: 11.7 % — ABNORMAL LOW (ref 12.0–46.0)
Lymphs Abs: 0.7 10*3/uL (ref 0.7–4.0)
MCHC: 33.9 g/dL (ref 30.0–36.0)
MCV: 96.8 fl (ref 78.0–100.0)
Monocytes Absolute: 1.3 10*3/uL — ABNORMAL HIGH (ref 0.1–1.0)
Monocytes Relative: 19.8 % — ABNORMAL HIGH (ref 3.0–12.0)
Neutro Abs: 4.2 10*3/uL (ref 1.4–7.7)
Neutrophils Relative %: 66.3 % (ref 43.0–77.0)
Platelets: 157 10*3/uL (ref 150.0–400.0)
RBC: 4.03 Mil/uL — ABNORMAL LOW (ref 4.22–5.81)
RDW: 12.3 % (ref 11.5–15.5)
WBC: 6.4 10*3/uL (ref 4.0–10.5)

## 2020-05-11 LAB — HEPATIC FUNCTION PANEL
ALT: 9 U/L (ref 0–53)
AST: 11 U/L (ref 0–37)
Albumin: 4.2 g/dL (ref 3.5–5.2)
Alkaline Phosphatase: 77 U/L (ref 39–117)
Bilirubin, Direct: 0.1 mg/dL (ref 0.0–0.3)
Total Bilirubin: 0.4 mg/dL (ref 0.2–1.2)
Total Protein: 6.9 g/dL (ref 6.0–8.3)

## 2020-05-11 LAB — BASIC METABOLIC PANEL
BUN: 22 mg/dL (ref 6–23)
CO2: 30 mEq/L (ref 19–32)
Calcium: 9.7 mg/dL (ref 8.4–10.5)
Chloride: 102 mEq/L (ref 96–112)
Creatinine, Ser: 1.5 mg/dL (ref 0.40–1.50)
GFR: 41.58 mL/min — ABNORMAL LOW (ref >60.00)
Glucose, Bld: 148 mg/dL — ABNORMAL HIGH (ref 70–99)
Potassium: 6 mEq/L — ABNORMAL HIGH (ref 3.5–5.1)
Sodium: 137 mEq/L (ref 135–145)

## 2020-05-11 LAB — HEMOGLOBIN A1C: Hgb A1c MFr Bld: 7.3 % — ABNORMAL HIGH (ref 4.6–6.5)

## 2020-05-11 LAB — H. PYLORI ANTIBODY, IGG: H Pylori IgG: POSITIVE — AB

## 2020-05-11 NOTE — Patient Instructions (Signed)
Gas-x (or generic simethicone) 1-2 capsules as needed.  (Max is 4 caps in 24 hours)

## 2020-05-11 NOTE — Telephone Encounter (Signed)
I called and let the phone ring many times without answer.  Butch Penny,   1.  His potassium is high, and I want to recheck it on Thursday.  Can you make sure that he is not eating a lot of bananas or taking potassium. Or potassium chloride on food.  2. 1 of blood cell types is high, so I want to repeat it and send a blood smear so the lab can look at it more.  Future orders are placed. Can you set up lab appt.   He also has H. Pylori bacteria - likely causing stomach issues, and I will send him in treatment tomorrow.  I will try again myself.

## 2020-05-12 MED ORDER — AMOXICILLIN 500 MG PO CAPS: 1000.0000 mg | ORAL_CAPSULE | Freq: Two times a day (BID) | ORAL | 0 refills | Status: AC

## 2020-05-12 MED ORDER — OMEPRAZOLE 20 MG PO CPDR: 20.0000 mg | DELAYED_RELEASE_CAPSULE | Freq: Two times a day (BID) | ORAL | 0 refills | Status: DC

## 2020-05-12 MED ORDER — CLARITHROMYCIN 500 MG PO TABS: 500.0000 mg | ORAL_TABLET | Freq: Two times a day (BID) | ORAL | 0 refills | Status: DC

## 2020-05-12 NOTE — Telephone Encounter (Signed)
Future labs have all been placed..  Mrs. Ager did tell me that he loves bananas and eats a lot of them.  I asked that he hold bananas until his future blood draw.  + H. Pylori, therapy as below  I reviewed all treatments with Mrs. Ansell, and she knows to follow-up for blood draw on Thursday.  Friday would also be ok.    ICD-10-CM   1. Hyperkalemia  Y77.4 Basic metabolic panel  2. Monocytosis  D72.821 CBC with Differential/Platelet    Pathologist smear review    Meds ordered this encounter  Medications  . omeprazole (PRILOSEC) 20 MG capsule    Sig: Take 1 capsule (20 mg total) by mouth 2 (two) times daily before a meal.    Dispense:  28 capsule    Refill:  0  . clarithromycin (BIAXIN) 500 MG tablet    Sig: Take 1 tablet (500 mg total) by mouth 2 (two) times daily.    Dispense:  28 tablet    Refill:  0  . amoxicillin (AMOXIL) 500 MG capsule    Sig: Take 2 capsules (1,000 mg total) by mouth 2 (two) times daily for 14 days.    Dispense:  56 capsule    Refill:  0   Results for orders placed or performed in visit on 12/87/86  Basic metabolic panel  Result Value Ref Range   Sodium 137 135 - 145 mEq/L   Potassium 6.0 (H) 3.5 - 5.1 mEq/L   Chloride 102 96 - 112 mEq/L   CO2 30 19 - 32 mEq/L   Glucose, Bld 148 (H) 70 - 99 mg/dL   BUN 22 6 - 23 mg/dL   Creatinine, Ser 1.50 0.40 - 1.50 mg/dL   GFR 41.58 (L) >60.00 mL/min   Calcium 9.7 8.4 - 10.5 mg/dL  CBC with Differential/Platelet  Result Value Ref Range   WBC 6.4 4.0 - 10.5 K/uL   RBC 4.03 (L) 4.22 - 5.81 Mil/uL   Hemoglobin 13.2 13.0 - 17.0 g/dL   HCT 39.0 39.0 - 52.0 %   MCV 96.8 78.0 - 100.0 fl   MCHC 33.9 30.0 - 36.0 g/dL   RDW 12.3 11.5 - 15.5 %   Platelets 157.0 150.0 - 400.0 K/uL   Neutrophils Relative % 66.3 43.0 - 77.0 %   Lymphocytes Relative 11.7 (L) 12.0 - 46.0 %   Monocytes Relative 19.8 Repeated and verified X2. (H) 3.0 - 12.0 %   Eosinophils Relative 1.6 0.0 - 5.0 %   Basophils Relative 0.6 0.0 - 3.0  %   Neutro Abs 4.2 1.4 - 7.7 K/uL   Lymphs Abs 0.7 0.7 - 4.0 K/uL   Monocytes Absolute 1.3 (H) 0.1 - 1.0 K/uL   Eosinophils Absolute 0.1 0.0 - 0.7 K/uL   Basophils Absolute 0.0 0.0 - 0.1 K/uL  Hepatic function panel  Result Value Ref Range   Total Bilirubin 0.4 0.2 - 1.2 mg/dL   Bilirubin, Direct 0.1 0.0 - 0.3 mg/dL   Alkaline Phosphatase 77 39 - 117 U/L   AST 11 0 - 37 U/L   ALT 9 0 - 53 U/L   Total Protein 6.9 6.0 - 8.3 g/dL   Albumin 4.2 3.5 - 5.2 g/dL  Hemoglobin A1c  Result Value Ref Range   Hgb A1c MFr Bld 7.3 (H) 4.6 - 6.5 %  H. pylori antibody, IgG  Result Value Ref Range   H Pylori IgG Positive (A) Negative

## 2020-05-12 NOTE — Addendum Note (Signed)
Addended by: [name] on: 05/12/2020 10:01 AM   Modules accepted: Orders

## 2020-05-12 NOTE — Telephone Encounter (Signed)
Lab appointment scheduled 05/14/2020 at 10:05 am.

## 2020-05-13 ENCOUNTER — Telehealth: Payer: Self-pay | Admitting: *Deleted

## 2020-05-13 NOTE — Telephone Encounter (Signed)
I think that that is probably fine. I think that he would be OK to continue taking that.

## 2020-05-13 NOTE — Telephone Encounter (Signed)
Mrs. Mcphearson notified as instructed by telephone.

## 2020-05-13 NOTE — Telephone Encounter (Signed)
Patent's wife called stating that her husband saw [name] on 05/11/20 and was given 3 new prescriptions. Patient's wife stated that he has been taking them in the morning without food. Mrs. Soulier stated that her husband woke up during the night with severe leg pain. Mrs. Warga stated that her husband took Ibuprofen and went back to sleep. Mrs. Milanes stated that her husband has never been one to take much medication.. Patient's wife stated that she forgot to tell [name] that she ordered him a supplement by phone by the name of Steroxin for bladder control. Patient's wife stated that they advertise that it prevents you from having to urinate so much during the night. Patient's wife stated that her husband has been taking the supplement for 2 months. Patient's wife wanted to know if he should continue the medications since he had leg pain during the night. Mrs. Delmundo stated that her husband is not having the leg pain at this time.

## 2020-05-14 ENCOUNTER — Other Ambulatory Visit: Payer: Self-pay

## 2020-05-14 ENCOUNTER — Other Ambulatory Visit (INDEPENDENT_AMBULATORY_CARE_PROVIDER_SITE_OTHER): Payer: Medicare HMO

## 2020-05-14 DIAGNOSIS — E875 Hyperkalemia: Secondary | ICD-10-CM | POA: Diagnosis not present

## 2020-05-14 DIAGNOSIS — D72821 Monocytosis (symptomatic): Secondary | ICD-10-CM | POA: Diagnosis not present

## 2020-05-14 DIAGNOSIS — H353211 Exudative age-related macular degeneration, right eye, with active choroidal neovascularization: Secondary | ICD-10-CM | POA: Diagnosis not present

## 2020-05-14 LAB — BASIC METABOLIC PANEL WITH GFR
BUN: 20 mg/dL (ref 6–23)
CO2: 28 meq/L (ref 19–32)
Calcium: 9.4 mg/dL (ref 8.4–10.5)
Chloride: 100 meq/L (ref 96–112)
Creatinine, Ser: 1.69 mg/dL — ABNORMAL HIGH (ref 0.40–1.50)
GFR: 36.04 mL/min — ABNORMAL LOW
Glucose, Bld: 270 mg/dL — ABNORMAL HIGH (ref 70–99)
Potassium: 5.9 meq/L — ABNORMAL HIGH (ref 3.5–5.1)
Sodium: 137 meq/L (ref 135–145)

## 2020-05-14 NOTE — Addendum Note (Signed)
Addended by: Ellamae Sia on: 05/14/2020 10:12 AM   Modules accepted: Orders

## 2020-05-15 LAB — CBC WITH DIFFERENTIAL/PLATELET
Absolute Monocytes: 1617 cells/uL — ABNORMAL HIGH (ref 200–950)
Basophils Absolute: 26 cells/uL (ref 0–200)
Basophils Relative: 0.3 %
Eosinophils Absolute: 60 cells/uL (ref 15–500)
Eosinophils Relative: 0.7 %
HCT: 40.7 % (ref 38.5–50.0)
Hemoglobin: 13.4 g/dL (ref 13.2–17.1)
Lymphs Abs: 550 cells/uL — ABNORMAL LOW (ref 850–3900)
MCH: 32.1 pg (ref 27.0–33.0)
MCHC: 32.9 g/dL (ref 32.0–36.0)
MCV: 97.6 fL (ref 80.0–100.0)
MPV: 10.9 fL (ref 7.5–12.5)
Monocytes Relative: 18.8 %
Neutro Abs: 6347 cells/uL (ref 1500–7800)
Neutrophils Relative %: 73.8 %
Platelets: 143 10*3/uL (ref 140–400)
RBC: 4.17 10*6/uL — ABNORMAL LOW (ref 4.20–5.80)
RDW: 11.3 % (ref 11.0–15.0)
Total Lymphocyte: 6.4 %
WBC: 8.6 10*3/uL (ref 3.8–10.8)

## 2020-05-15 LAB — PATHOLOGIST SMEAR REVIEW

## 2020-05-18 ENCOUNTER — Emergency Department
Admission: EM | Admit: 2020-05-18 | Discharge: 2020-05-18 | Disposition: A | Discharge status: Home / Self Care | Payer: Medicare HMO | Attending: Emergency Medicine | Admitting: Emergency Medicine

## 2020-05-18 ENCOUNTER — Telehealth: Payer: Self-pay | Admitting: *Deleted

## 2020-05-18 ENCOUNTER — Encounter: Payer: Self-pay | Admitting: Emergency Medicine

## 2020-05-18 ENCOUNTER — Ambulatory Visit: Payer: Medicare HMO | Admitting: Podiatry

## 2020-05-18 ENCOUNTER — Emergency Department: Payer: Medicare HMO

## 2020-05-18 ENCOUNTER — Other Ambulatory Visit: Payer: Self-pay

## 2020-05-18 DIAGNOSIS — Z85828 Personal history of other malignant neoplasm of skin: Secondary | ICD-10-CM | POA: Insufficient documentation

## 2020-05-18 DIAGNOSIS — I2510 Atherosclerotic heart disease of native coronary artery without angina pectoris: Secondary | ICD-10-CM | POA: Insufficient documentation

## 2020-05-18 DIAGNOSIS — I129 Hypertensive chronic kidney disease with stage 1 through stage 4 chronic kidney disease, or unspecified chronic kidney disease: Secondary | ICD-10-CM | POA: Diagnosis not present

## 2020-05-18 DIAGNOSIS — E860 Dehydration: Secondary | ICD-10-CM | POA: Insufficient documentation

## 2020-05-18 DIAGNOSIS — Z87891 Personal history of nicotine dependence: Secondary | ICD-10-CM | POA: Insufficient documentation

## 2020-05-18 DIAGNOSIS — F0390 Unspecified dementia without behavioral disturbance: Secondary | ICD-10-CM | POA: Insufficient documentation

## 2020-05-18 DIAGNOSIS — Z7984 Long term (current) use of oral hypoglycemic drugs: Secondary | ICD-10-CM | POA: Insufficient documentation

## 2020-05-18 DIAGNOSIS — Z7982 Long term (current) use of aspirin: Secondary | ICD-10-CM | POA: Diagnosis not present

## 2020-05-18 DIAGNOSIS — R69 Illness, unspecified: Secondary | ICD-10-CM | POA: Diagnosis not present

## 2020-05-18 DIAGNOSIS — R509 Fever, unspecified: Secondary | ICD-10-CM | POA: Diagnosis not present

## 2020-05-18 DIAGNOSIS — J439 Emphysema, unspecified: Secondary | ICD-10-CM | POA: Diagnosis not present

## 2020-05-18 DIAGNOSIS — R1031 Right lower quadrant pain: Secondary | ICD-10-CM | POA: Diagnosis not present

## 2020-05-18 DIAGNOSIS — Z8546 Personal history of malignant neoplasm of prostate: Secondary | ICD-10-CM | POA: Insufficient documentation

## 2020-05-18 DIAGNOSIS — R197 Diarrhea, unspecified: Secondary | ICD-10-CM | POA: Insufficient documentation

## 2020-05-18 DIAGNOSIS — N183 Chronic kidney disease, stage 3 unspecified: Secondary | ICD-10-CM | POA: Diagnosis not present

## 2020-05-18 DIAGNOSIS — Z20822 Contact with and (suspected) exposure to covid-19: Secondary | ICD-10-CM | POA: Diagnosis not present

## 2020-05-18 LAB — COMPREHENSIVE METABOLIC PANEL
ALT: 11 U/L (ref 0–44)
AST: 13 U/L — ABNORMAL LOW (ref 15–41)
Albumin: 4.2 g/dL (ref 3.5–5.0)
Alkaline Phosphatase: 73 U/L (ref 38–126)
Anion gap: 11 (ref 5–15)
BUN: 25 mg/dL — ABNORMAL HIGH (ref 8–23)
CO2: 25 mmol/L (ref 22–32)
Calcium: 9.1 mg/dL (ref 8.9–10.3)
Chloride: 100 mmol/L (ref 98–111)
Creatinine, Ser: 1.87 mg/dL — ABNORMAL HIGH (ref 0.61–1.24)
GFR, Estimated: 34 mL/min — ABNORMAL LOW (ref >60)
Glucose, Bld: 172 mg/dL — ABNORMAL HIGH (ref 70–99)
Potassium: 4.3 mmol/L (ref 3.5–5.1)
Sodium: 136 mmol/L (ref 135–145)
Total Bilirubin: 0.8 mg/dL (ref 0.3–1.2)
Total Protein: 7.1 g/dL (ref 6.5–8.1)

## 2020-05-18 LAB — URINALYSIS, COMPLETE (UACMP) WITH MICROSCOPIC
Bilirubin Urine: NEGATIVE
Glucose, UA: NEGATIVE mg/dL
Hgb urine dipstick: NEGATIVE
Ketones, ur: NEGATIVE mg/dL
Leukocytes,Ua: NEGATIVE
Nitrite: NEGATIVE
Protein, ur: NEGATIVE mg/dL
Specific Gravity, Urine: 1.026 (ref 1.005–1.030)
Squamous Epithelial / HPF: NONE SEEN (ref 0–5)
pH: 5 (ref 5.0–8.0)

## 2020-05-18 LAB — CBC WITH DIFFERENTIAL/PLATELET
Abs Immature Granulocytes: 0.12 10*3/uL — ABNORMAL HIGH (ref 0.00–0.07)
Basophils Absolute: 0 10*3/uL (ref 0.0–0.1)
Basophils Relative: 0 %
Eosinophils Absolute: 0.1 10*3/uL (ref 0.0–0.5)
Eosinophils Relative: 1 %
HCT: 44.6 % (ref 39.0–52.0)
Hemoglobin: 14.7 g/dL (ref 13.0–17.0)
Immature Granulocytes: 1 %
Lymphocytes Relative: 8 %
Lymphs Abs: 0.7 10*3/uL (ref 0.7–4.0)
MCH: 32.4 pg (ref 26.0–34.0)
MCHC: 33 g/dL (ref 30.0–36.0)
MCV: 98.2 fL (ref 80.0–100.0)
Monocytes Absolute: 1.9 10*3/uL — ABNORMAL HIGH (ref 0.1–1.0)
Monocytes Relative: 22 %
Neutro Abs: 5.8 10*3/uL (ref 1.7–7.7)
Neutrophils Relative %: 68 %
Platelets: 152 10*3/uL (ref 150–400)
RBC: 4.54 MIL/uL (ref 4.22–5.81)
RDW: 12 % (ref 11.5–15.5)
WBC: 8.7 10*3/uL (ref 4.0–10.5)
nRBC: 0 % (ref 0.0–0.2)

## 2020-05-18 LAB — RESP PANEL BY RT-PCR (FLU A&B, COVID) ARPGX2
Influenza A by PCR: NEGATIVE
Influenza B by PCR: NEGATIVE
SARS Coronavirus 2 by RT PCR: NEGATIVE

## 2020-05-18 LAB — LACTIC ACID, PLASMA
Lactic Acid, Venous: 2 mmol/L (ref 0.5–1.9)
Lactic Acid, Venous: 1.4 mmol/L (ref 0.5–1.9)

## 2020-05-18 LAB — LIPASE, BLOOD: Lipase: 61 U/L — ABNORMAL HIGH (ref 11–51)

## 2020-05-18 MED ORDER — ACETAMINOPHEN 500 MG PO TABS
1000.0000 mg | ORAL_TABLET | Freq: Once | ORAL | Status: AC
  Administered 2020-05-18: 1000 mg via ORAL
  Filled 2020-05-18: qty 2

## 2020-05-18 MED ORDER — IOHEXOL 300 MG/ML  SOLN
75.0000 mL | Freq: Once | INTRAMUSCULAR | Status: AC | PRN
  Administered 2020-05-18: 75 mL via INTRAVENOUS

## 2020-05-18 MED ORDER — SODIUM CHLORIDE 0.9 % IV BOLUS
1000.0000 mL | Freq: Once | INTRAVENOUS | Status: AC
  Administered 2020-05-18: 1000 mL via INTRAVENOUS

## 2020-05-18 MED ORDER — IOHEXOL 9 MG/ML PO SOLN
500.0000 mL | Freq: Two times a day (BID) | ORAL | Status: DC | PRN
  Administered 2020-05-18: 500 mL via ORAL

## 2020-05-18 MED ORDER — ONDANSETRON HCL 4 MG/2ML IJ SOLN
4.0000 mg | Freq: Once | INTRAMUSCULAR | Status: AC
  Administered 2020-05-18: 4 mg via INTRAVENOUS
  Filled 2020-05-18: qty 2

## 2020-05-18 NOTE — Telephone Encounter (Signed)
See my prior notes.  I worry about this.  Last week he thought that he was having gas, labs were abnormal, and with progression to 102 fever, I worry that there is something significant brewing.

## 2020-05-18 NOTE — ED Notes (Signed)
Discharge instructions discussed with patient and son at bedside. Pending blood cultures discussed. Patient informed to continue prescribed antibiotics as instructed by Dr. Kerman Passey.

## 2020-05-18 NOTE — Telephone Encounter (Signed)
Patient's wife called stating that her husband has seen [name] and was given 3 strong medications.  Patient's wife stated that he has had a bad headache and fever since Wednesday and requested a call back.  After speaking to [name] I called the patient's wife back.. Patient's wife stated that her husband has been running a fever of 102.0 even with Tylenol and Aleve. Patient's wife stated that she has never known him to run a fever this high. Patient's wife stated that he did a home covid test yesterday and it was negative. Advised patient's wife that [name] had planned on calling them today. Patient's wife was advised since her husband seems to be getting worse he needs to go to the ER to be evaluated. Patient's wife stated that she will take him to [facility] ER to be evaluated as soon as she can get dressed. Patient's wife stated that they have a caregiver that can take him there. Patient's wife stated that her husband has told her that he will not be admitted to the hospital. Advised patient's wife that he needs to do whatever is necessary  to take care of his illness. Patient's wife stated that she will get him to the ER shortly.

## 2020-05-18 NOTE — ED Notes (Signed)
Patient is resting comfortably. Son at bedside. POC discussed.

## 2020-05-18 NOTE — ED Triage Notes (Signed)
Pt in with fever of 101 last night, states his doc referred him to the ED for suspected ongoing infection. Has been on Amoxicillin and Clarithromycin since 2/8 for H Pylori. Temp 97.7 in triage. Endorses diarrhea, no n/v or cp

## 2020-05-18 NOTE — ED Notes (Signed)
Patient son at bedside. Patient provided urinal to provide urine sample. Patient POC discussed with patient and son. No questions expressed by patient.

## 2020-05-18 NOTE — ED Notes (Signed)
Patient is resting comfortably. Son at bedside.

## 2020-05-18 NOTE — ED Provider Notes (Signed)
[facility] Emergency Department Provider Note  Time seen: 3:17 PM  I have reviewed the triage vital signs and the nursing notes.   HISTORY  Chief Complaint Fever and sent by PCP   HPI [name] is a 87 y.o. male with a past medical history of dementia, CAD, prostate cancer, diabetes, presents to the emergency department for fever.  According to the son for the past 5 days the patient has been experiencing a fever, followed up with his doctor who prescribed him an antibiotic as a precaution although no clear source was identified.  They did a home Covid test that was negative.  Denies any cough or shortness of breath.  Does state the patient has intermittently been complaining of abdominal discomfort at times as well as diarrhea but no vomiting.  PCP sent the patient to the emergency department for further work-up.  Patient has had Covid vaccinations and booster.   Past Medical History:  Diagnosis Date  . Adenocarcinoma of left lung, stage 1 ([place]) 2001   T1N0 stage I  adenoca left lung resected 01/03/00   . Alzheimer's disease ([place])   . CAD (coronary artery disease) 2014   a. 09/2012 Cath: LM nl, LAD 50-60p, D1 60-70 m, D1 50ost, LCX nl, RCA min irregs, EF 55-65%.  . Generalized anxiety disorder 01/08/2007   Qualifier: Diagnosis of  By: Diona Browner MD, [name]    . History of colonic polyps 07/25/2011  . Macular degeneration of both eyes 01/21/2014  . Nonmelanoma skin cancer 07/25/2011   Multiple lesions excised face/nose  . Pericarditis    a. 09/2012 with effusion and tamponade, s/p window.  b.  F/u [facility] 09/19/12: mod LVH, EF 55%, Gr 1 DD, Tr MR, mild RVE, no residual effusion  . Prostate CA ([place]) 04/2001   Gleason 7  S/P prostatectomy 04/11/01  . 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 SCC (squamous cell carcinoma) 02/18/2013   BELOW LEFT EYE SCC IN SITU TX WITH BX  . SCC (squamous cell carcinoma)  08/27/2008   RIGHT OUTER BROW FOCAL IN SITU TX WITH BX  . SCC (squamous cell carcinoma) 08/27/2008   BELOW LEFT EYE FOCAL IN SITU TX CX3 5FU  . SCC (squamous cell carcinoma) 10/25/2006   RIGHT ELBOW SCC IN SITU TX WITH BX CX3 5FU  . SCC (squamous cell carcinoma) 04/12/2005   RIGHT NECK INF. SCC IN SITU TX CX3  . SCC (squamous cell carcinoma) 04/12/2005   RIGHT NECK SUP. SCC IN SITU TX EXC  . SCC (squamous cell carcinoma) 04/16/2013   LEFT TEMPLE SCC IN SITU TX CX3 5FU  . SCC (squamous cell carcinoma) 04/16/2013   BELOW LEFT EYE SCC IN SITU TX CX3 5FU  . SCC (squamous cell carcinoma) 04/16/2013   BELOW RIGHT EYE SCC IN SITU TX CX3 5FU  . SCC (squamous cell carcinoma) 08/04/2014   LEFT CHEEK SCC IN SITU TX CX3 5FU  . SCC (squamous cell carcinoma) 11/27/2018   LEFT TEMPLE SCC IN SITU TX WITH BX  . SCC (squamous cell carcinoma)   . SCC (squamous cell carcinoma)   . SCC (squamous cell carcinoma)   . SCC (squamous cell carcinoma) Well Diff 09/13/2016   Tip of Nose SCC WELL DIFF TX (MOH's), and RIGHT INNER EYE SUP. SCC IN SITU TX TO WATCH  . SCC (squamous cell carcinoma) Well Diff 05/30/2017   Right Cheekbone (Cx3,5FU)  and Under Left Eye (Cx3,5FU)  . Squamous cell carcinoma in situ (SCCIS) 04/12/2005   Right Neck Inf (Cx3), Right Neck Sup (Exc), and Bulb of Nose (Cx3,Exc)  . Squamous cell carcinoma in situ (SCCIS) 09/27/2005   Nose (Cx3,Exc)  . Squamous cell carcinoma in situ (SCCIS) 02/18/2013   Below Left Eye (tx p bx)  . Squamous cell carcinoma in situ (SCCIS) 04/16/2013   Left Temple (Cx3,5FU), Below Left Eye (Cx3,5FU), Below Right Eye (Cx3,5FU)  . Squamous cell carcinoma in situ (SCCIS) 08/04/2014   Left Cheek (Cx3,5FU)  . Squamous cell carcinoma in situ (SCCIS) 09/13/2016   Right Inner Eye Sup. (Watch)  . Squamous cell carcinoma in situ (SCCIS) Focal 08/24/2008   Right Outer Brow (tx p bx) and Below Left Eye (Cx3,5FU)  . Squamous cell carcinoma in situ (SCCIS) Hypertrophic  10/25/2006   Right Elbow (Cx3,5FU)  . Squamous cell carcinoma of skin 09/27/2005   NOSE SCC IN SITU TC CX3 5FU  . 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 Type 2 diabetes mellitus with vascular disease ([place]) 05/21/2007   Qualifier: Diagnosis of  By: Diona Browner MD, [name]    . Type 2 DM with CKD stage 3 and hypertension ([place]) 07/16/2015  . Unspecified essential hypertension     Patient Active Problem List   Diagnosis Date Noted  . Sensorineural hearing loss (SNHL) of both ears 05/15/2017  . Tinnitus, bilateral 05/15/2017  . Macular degeneration of both eyes 01/21/2014  . 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 CAD (coronary artery disease)   . Pericardial tamponade 09/17/2012  . Acute pericarditis, unspecified 09/13/2012  . Adenocarcinoma of left lung, stage 1 ([place]) 07/25/2011  . Cancer of prostate w/med recur risk (T2b-c or Gleason 7 or PSA 10-20) ([place]) 07/25/2011  . Nonmelanoma skin cancer 07/25/2011  . History of colonic polyps 07/25/2011  . Type 2 diabetes mellitus with vascular disease ([place]) 05/21/2007  . HYPERCHOLESTEROLEMIA 05/21/2007  . CARCINOMA, SKIN, SQUAMOUS CELL 01/08/2007  . Generalized anxiety disorder 01/08/2007  . ALZHEIMER'S DISEASE, MILD 01/08/2007  . Essential hypertension 01/08/2007    Past Surgical History:  Procedure Laterality Date  . HERNIA REPAIR    . LEFT HEART CATHETERIZATION WITH CORONARY ANGIOGRAM N/A 09/13/2012   Procedure: LEFT HEART CATHETERIZATION WITH CORONARY ANGIOGRAM;  Surgeon: Sherren Mocha, MD;  Location: [facility] CATH LAB;  Service: Cardiovascular;  Laterality: N/A;  . LOBECTOMY  2001   upper left  . PERICARDIAL TAP N/A 09/16/2012   Procedure: PERICARDIAL TAP;  Surgeon: Sinclair Grooms, MD;  Location: [facility] CATH LAB;  Service: Cardiovascular;  Laterality: N/A;  . PROSTATECTOMY  2003  . SUBXYPHOID PERICARDIAL WINDOW N/A 09/16/2012   Procedure: SUBXYPHOID PERICARDIAL WINDOW;  Surgeon: Rexene Alberts, MD;  Location: [facility];  Service: Thoracic;   Laterality: N/A;  . TONSILLECTOMY      Prior to Admission medications   Medication Sig Start Date End Date Taking? Authorizing Provider  amoxicillin (AMOXIL) 500 MG capsule Take 2 capsules (1,000 mg total) by mouth 2 (two) times daily for 14 days. 05/12/20 05/26/20  [name], MD  aspirin EC 81 MG tablet Take 81 mg by mouth daily.    [provider]  clarithromycin (BIAXIN) 500 MG tablet Take 1 tablet (500 mg total) by mouth 2 (two) times daily. 05/12/20   Copland, Frederico Hamman, MD  diphenhydrAMINE (BENADRYL) 25 MG tablet Take 25 mg by mouth 2 (two) times daily.    [provider]  donepezil (ARICEPT) 10 MG tablet TAKE 1  TABLET BY MOUTH EVERYDAY AT BEDTIME 02/25/20   Copland, Spencer, MD  glipiZIDE (GLUCOTROL) 5 MG tablet TAKE 1 TABLET BY MOUTH EVERY DAY BEFORE BREAKFAST 01/15/20   Copland, Frederico Hamman, MD  metFORMIN (GLUCOPHAGE-XR) 500 MG 24 hr tablet TAKE 1 TABLET BY MOUTH TWICE A DAY Patient not taking: Reported on 05/18/2020 05/10/19   Copland, Frederico Hamman, MD  Multiple Vitamins-Minerals (ICAPS LUTEIN & ZEAXANTHIN PO) Take 1 capsule by mouth 2 (two) times daily.     [provider]  Omega-3 Fatty Acids (FISH OIL) 1000 MG CAPS Take 1,000 mg by mouth in the morning and at bedtime.     [provider]  omeprazole (PRILOSEC) 20 MG capsule Take 1 capsule (20 mg total) by mouth 2 (two) times daily before a meal. 05/12/20   Copland, Frederico Hamman, MD  sertraline (ZOLOFT) 100 MG tablet TAKE 1 TABLET BY MOUTH EVERY DAY 01/21/20   [name], MD    Allergies  Allergen Reactions  . Sulfa Antibiotics Nausea Only    Family History  Problem Relation Age of Onset  . Cancer Father        colon  . Dementia Mother   . Diabetes Mother   . Cancer Brother        lung    Social History Social History   Tobacco Use  . Smoking status: Former Smoker    Packs/day: 2.00    Years: 42.00    Pack years: 84.00    Types: Cigarettes    Start date: 02/25/1953    Quit date: 04/04/1984     Years since quitting: 36.1  . Smokeless tobacco: Former Systems developer    Types: Chew    Quit date: 07/27/1986  . Tobacco comment: quit tobacco 26 years ago  Vaping Use  . Vaping Use: Never used  Substance Use Topics  . Alcohol use: No    Alcohol/week: 0.0 standard drinks  . Drug use: No    Review of Systems Constitutional: Intermittent fever for the past 5 days. Cardiovascular: Negative for chest pain. Respiratory: Negative for shortness of breath. Gastrointestinal: Intermittently complaining of abdominal pain.  None currently.  Positive for nausea but negative for vomiting.  Occasional episodes of diarrhea. Genitourinary: Negative for urinary compaints Musculoskeletal: Negative for musculoskeletal complaints Neurological: Negative for headache All other ROS negative  ____________________________________________   PHYSICAL EXAM:  VITAL SIGNS: ED Triage Vitals  Enc Vitals Group     BP 05/18/20 1251 137/86     Pulse Rate 05/18/20 1251 86     Resp 05/18/20 1251 18     Temp 05/18/20 1251 97.7 F (36.5 C)     Temp Source 05/18/20 1251 Oral     SpO2 05/18/20 1251 100 %     Weight 05/18/20 1252 183 lb 8.2 oz (83.2 kg)     Height 05/18/20 1506 5\' 9"  (1.753 m)     Head Circumference --      Peak Flow --      Pain Score 05/18/20 1504 0     Pain Loc --      Pain Edu? --      Excl. in Early? --    Constitutional: Alert and oriented. Well appearing and in no distress. Eyes: Normal exam ENT      Head: Normocephalic and atraumatic.      Mouth/Throat: Mucous membranes are moist. Cardiovascular: Normal rate, regular rhythm. Respiratory: Normal respiratory effort without tachypnea nor retractions. Breath sounds are clear Gastrointestinal: Soft, patient does have mild tenderness palpation the  right lower quadrant.  No rebound guarding or distention. Musculoskeletal: Nontender with normal range of motion in all extremities. Neurologic:  Normal speech and language. No gross focal neurologic  deficits Skin:  Skin is warm, dry and intact.  Psychiatric: Mood and affect are normal.  ____________________________________________  RADIOLOGY  Chest x-ray negative  ____________________________________________   INITIAL IMPRESSION / ASSESSMENT AND PLAN / ED COURSE  Pertinent labs & imaging results that were available during my care of the patient were reviewed by me and considered in my medical decision making (see chart for details).   Patient presents to the emergency department with complaints of intermittent fever over the past 5 days as well as diarrhea.  Differential is quite broad but would include COVID-19, patient has only had a home rapid test we will perform a PCR.  Differential would also include intra-abdominal pathology, lab work overall reassuring besides a very slight lipase elevation.  Patient does have mild right lower quadrant tenderness to palpation on my examination.  We will proceed with CT abdomen/pelvis to further evaluate.  We will IV hydrate and treat nausea while awaiting for the results.  Patient and son agreeable to plan of care.  CT scan essentially negative for acute abnormality.  Labs are reassuring.  Lactate is 1.4.  Covid is negative.  Urinalysis does show rare bacteria we will send urine culture.  Patient afebrile in the emergency department however given reported fever we will check blood cultures x2 as a precaution.  As the patient overall appears well I do believe the patient safe for discharge home, cultures pending.  Patient is to follow-up with his PCP in the next 2 days for recheck/reevaluation.  Discussed return precautions.  Patient and son agreeable to plan of care.  [name] was evaluated in Emergency Department on 05/18/2020 for the symptoms described in the history of present illness. He was evaluated in the context of the global COVID-19 pandemic, which necessitated consideration that the patient might be at risk for infection with the  SARS-CoV-2 virus that causes COVID-19. Institutional protocols and algorithms that pertain to the evaluation of patients at risk for COVID-19 are in a state of rapid change based on information released by regulatory bodies including the CDC and federal and state organizations. These policies and algorithms were followed during the patient's care in the ED.  ____________________________________________   FINAL CLINICAL IMPRESSION(S) / ED DIAGNOSES  Fever Dehydration   Harvest Dark, MD 05/18/20 [phone]

## 2020-05-18 NOTE — ED Notes (Signed)
Patient is resting comfortably. Updated on pending results.

## 2020-05-18 NOTE — ED Notes (Signed)
Patient is resting comfortably. Patient and son updated on pending results.

## 2020-05-18 NOTE — Discharge Instructions (Addendum)
As we have discussed your work-up is overall reassuring in the emergency department.  Blood and urine cultures have been sent.  Please follow-up with your doctor in the next 1 to 2 days for recheck/reevaluation.  Please continue to drink plenty of fluids at home, use Tylenol or ibuprofen every 6 hours as needed for fever or discomfort.  Return to the emergency department for any new or concerning symptoms.

## 2020-05-20 ENCOUNTER — Encounter: Payer: Self-pay | Admitting: Family Medicine

## 2020-05-20 ENCOUNTER — Other Ambulatory Visit: Payer: Self-pay

## 2020-05-20 ENCOUNTER — Telehealth (INDEPENDENT_AMBULATORY_CARE_PROVIDER_SITE_OTHER): Payer: Medicare HMO | Admitting: Family Medicine

## 2020-05-20 VITALS — BP 156/73 | HR 85 | Temp 98.6°F | Ht 69.0 in

## 2020-05-20 DIAGNOSIS — B349 Viral infection, unspecified: Secondary | ICD-10-CM

## 2020-05-20 DIAGNOSIS — R509 Fever, unspecified: Secondary | ICD-10-CM | POA: Diagnosis not present

## 2020-05-20 LAB — URINE CULTURE: Culture: NO GROWTH

## 2020-05-20 NOTE — Progress Notes (Signed)
Ethan Brocks T. [name], [name] [facility] at [facility] [place], [address] Phone: [phone]  FAX: [phone]  [name] - 87 y.o. male  MRN [id number]  Date of Birth: 1931-03-01  Visit Date: 05/20/2020  PCP: [name], [name]  Referred by: [name], [name]  Virtual Visit via Telephone Note:  I connected with  [name] on 05/20/2020  2:40 PM EST by telephone and verified that I am speaking with the correct person using two identifiers.   Location patient: home phone or cell phone Location provider: work or home office Consent: Verbal consent directly obtained from [name] and that there may be a patient responsible charge related to this service. Persons participating in the virtual visit: patient, provider  I discussed the limitations of evaluation and management by telemedicine and the availability of in person appointments.  The patient expressed understanding and agreed to proceed.     History of Present Illness:  F/u ER visit Monday, 05/18/2020: I spoke to the patient on speaker phone along with his wife.  I had the patient go to the ER on Monday, when the information was relayed to me that he had a temperature of 102.  He was evaluated thoroughly in the ER, and he had a normal chest x-ray and his abdominal and pelvis CT was grossly normal.  There was one minor abnormality in the kidney.  He has been feeling sick to his stomach, and he has had some diarrhea.  He continues to have some of the gurgling in his stomach that he appreciated before his last exam.  He also just generally does not feel all that well is achy.  His wife tells me that over the last 6 months he has been been sleeping more, and his drive to eat has decreased.  Review of Systems: pertinent positives and pertinent negatives as per HPI No acute distress verbally   Observations/Objective/Exam:  An  attempt was made to discern vital signs over the phone and per patient if applicable and possible.   Neurological:     Mental Status: pleasant and appropriate   Psychiatric:        Thought Content: Thought content normal.      Results for orders placed or performed during the hospital encounter of 05/18/20  Resp Panel by RT-PCR (Flu A&B, Covid)   Specimen: Nasopharyngeal(NP) swabs in vial transport medium  Result Value Ref Range   SARS Coronavirus 2 by RT PCR NEGATIVE NEGATIVE   Influenza A by PCR NEGATIVE NEGATIVE   Influenza B by PCR NEGATIVE NEGATIVE  Urine Culture   Specimen: Urine, Random  Result Value Ref Range   Specimen Description      URINE, RANDOM Performed at [facility], [address], [place], [place] [address]    Special Requests      NONE Performed at [facility], [address] [place], [place] [address]    Culture      NO GROWTH Performed at [facility], [place] [address], [place],  [address]    Report Status 05/20/2020 FINAL   Blood culture (routine x 2)   Specimen: BLOOD  Result Value Ref Range   Specimen Description BLOOD BLOOD RIGHT WRIST    Special Requests      BOTTLES DRAWN AEROBIC AND ANAEROBIC Blood Culture results may not be optimal due to an inadequate volume of blood received in culture  bottles   Culture      NO GROWTH 3 DAYS Performed at [facility], [address] [place], [place] [address]    Report Status PENDING   Lactic acid, plasma  Result Value Ref Range   Lactic Acid, Venous 2.0 (HH) 0.5 - 1.9 mmol/L  Lactic acid, plasma  Result Value Ref Range   Lactic Acid, Venous 1.4 0.5 - 1.9 mmol/L  Comprehensive metabolic panel  Result Value Ref Range   Sodium 136 135 - 145 mmol/L   Potassium 4.3 3.5 - 5.1 mmol/L   Chloride 100 98 - 111 mmol/L   CO2 25 22 - 32 mmol/L   Glucose, Bld 172 (H) 70 - 99 mg/dL   BUN 25 (H) 8 - 23 mg/dL   Creatinine, Ser 1.87 (H) 0.61 - 1.24 mg/dL   Calcium 9.1  8.9 - 10.3 mg/dL   Total Protein 7.1 6.5 - 8.1 g/dL   Albumin 4.2 3.5 - 5.0 g/dL   AST 13 (L) 15 - 41 U/L   ALT 11 0 - 44 U/L   Alkaline Phosphatase 73 38 - 126 U/L   Total Bilirubin 0.8 0.3 - 1.2 mg/dL   GFR, Estimated 34 (L) >60 mL/min   Anion gap 11 5 - 15  CBC with Differential  Result Value Ref Range   WBC 8.7 4.0 - 10.5 K/uL   RBC 4.54 4.22 - 5.81 MIL/uL   Hemoglobin 14.7 13.0 - 17.0 g/dL   HCT 44.6 39.0 - 52.0 %   MCV 98.2 80.0 - 100.0 fL   MCH 32.4 26.0 - 34.0 pg   MCHC 33.0 30.0 - 36.0 g/dL   RDW 12.0 11.5 - 15.5 %   Platelets 152 150 - 400 K/uL   nRBC 0.0 0.0 - 0.2 %   Neutrophils Relative % 68 %   Neutro Abs 5.8 1.7 - 7.7 K/uL   Lymphocytes Relative 8 %   Lymphs Abs 0.7 0.7 - 4.0 K/uL   Monocytes Relative 22 %   Monocytes Absolute 1.9 (H) 0.1 - 1.0 K/uL   Eosinophils Relative 1 %   Eosinophils Absolute 0.1 0.0 - 0.5 K/uL   Basophils Relative 0 %   Basophils Absolute 0.0 0.0 - 0.1 K/uL   Immature Granulocytes 1 %   Abs Immature Granulocytes 0.12 (H) 0.00 - 0.07 K/uL  Urinalysis, Complete w Microscopic  Result Value Ref Range   Color, Urine AMBER (A) YELLOW   APPearance HAZY (A) CLEAR   Specific Gravity, Urine 1.026 1.005 - 1.030   pH 5.0 5.0 - 8.0   Glucose, UA NEGATIVE NEGATIVE mg/dL   Hgb urine dipstick NEGATIVE NEGATIVE   Bilirubin Urine NEGATIVE NEGATIVE   Ketones, ur NEGATIVE NEGATIVE mg/dL   Protein, ur NEGATIVE NEGATIVE mg/dL   Nitrite NEGATIVE NEGATIVE   Leukocytes,Ua NEGATIVE NEGATIVE   RBC / HPF 0-5 0 - 5 RBC/hpf   WBC, UA 0-5 0 - 5 WBC/hpf   Bacteria, UA RARE (A) NONE SEEN   Squamous Epithelial / LPF NONE SEEN 0 - 5   Mucus PRESENT    Hyaline Casts, UA PRESENT   Lipase, blood  Result Value Ref Range   Lipase 61 (H) 11 - 51 U/L    DG Chest 2 View  Result Date: 05/18/2020 CLINICAL DATA:  Suspected infection 87 year old male. EXAM: CHEST - 2 VIEW COMPARISON:  June 25, 2018. FINDINGS: Trachea midline. Cardiomediastinal contours and hilar  structures are normal. Signs of pulmonary emphysema as before. Postoperative changes along the  LEFT mediastinal border. No lobar consolidation. No sign of pleural effusion. Osteopenia without acute skeletal process on limited evaluation. IMPRESSION: 1. No acute cardiopulmonary disease. 2. Signs of pulmonary emphysema. Electronically Signed   By: Zetta Bills M.D.   On: 05/18/2020 13:19   CT ABDOMEN PELVIS W CONTRAST  Result Date: 05/18/2020 CLINICAL DATA:  Right lower quadrant pain EXAM: CT ABDOMEN AND PELVIS WITH CONTRAST TECHNIQUE: Multidetector CT imaging of the abdomen and pelvis was performed using the standard protocol following bolus administration of intravenous contrast. CONTRAST:  58mL OMNIPAQUE IOHEXOL 300 MG/ML  SOLN COMPARISON:  09/21/2007 FINDINGS: Lower chest: Lung bases demonstrate no acute consolidation or effusion. Small hiatal hernia Hepatobiliary: Multiple hepatic cysts. Subcentimeter hepatic lesions too small to further characterize. Multiple calcified gallstones. No biliary dilatation Pancreas: Unremarkable. No pancreatic ductal dilatation or surrounding inflammatory changes. Spleen: Multiple splenules.  Otherwise negative Adrenals/Urinary Tract: Adrenal glands are normal. Multiple bilateral renal cysts. Additional subcentimeter hypodensities too small to further characterize. 2.3 cm slightly dense lesion at mid pole right kidney adjacent to a cyst. This is indeterminate. No hydronephrosis. The bladder is unremarkable Stomach/Bowel: Stomach is within normal limits. Appendix appears normal. No evidence of bowel wall thickening, distention, or inflammatory changes. 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 No suspicious nodes. Reproductive: Post treatment changes of the prostate with multiple clips. Prostatectomy. Other: No free air or free fluid. Fat containing right inguinal hernia.  Musculoskeletal: Stable sclerotic focus in L5 likely a bone island. No acute or suspicious osseous abnormality IMPRESSION: 1. No CT evidence for acute intra-abdominal or pelvic abnormality. Normal appendix. 2. 2.3 cm indeterminate lesion at the mid pole of the right kidney. When the patient is clinically stable and able to follow directions and hold their breath (preferably as an outpatient) further evaluation with dedicated abdominal MRI should be considered. 3. Gallstones. 4. Small hiatal hernia. Aortic Atherosclerosis (ICD10-I70.0). Electronically Signed   By: Donavan Foil M.D.   On: 05/18/2020 17:26     Assessment and Plan:    ICD-10-CM   1. Viral syndrome  B34.9   2. Fever, unspecified fever cause  R50.9    Total encounter time: 20 minutes. This includes total time spent on the day of encounter.   I think that we can feel pretty good that the patient has had a thorough work-up, and he does not have any concerning issues including no COVID-19, influenza, or intra-abdominal pathology that would be driving this.  He has no pneumonia, he also does not have a urinary tract infection.  We talked about all of this on the phone, and I think that doing supportive care is most appropriate.  I think this sleeping more and decreased appetite is secondary to age.  I encouraged him to eat anything that he wants.  I discussed the assessment and treatment plan with the patient. The patient was provided an opportunity to ask questions and all were answered. The patient agreed with the plan and demonstrated an understanding of the instructions.   The patient was advised to call back or seek an in-person evaluation if the symptoms worsen or if the condition fails to improve as anticipated.  Follow-up: prn unless noted otherwise below No follow-ups on file.  No orders of the defined types were placed in this encounter.  No orders of the defined types were placed in this encounter.   Signed,  [name]. Idris Edmundson, [name]

## 2020-05-21 ENCOUNTER — Ambulatory Visit: Payer: Medicare HMO | Admitting: Podiatry

## 2020-05-21 ENCOUNTER — Encounter: Payer: Self-pay | Admitting: Family Medicine

## 2020-05-22 ENCOUNTER — Telehealth: Payer: Self-pay

## 2020-05-22 NOTE — Chronic Care Management (AMB) (Addendum)
Chronic Care Management Pharmacy Assistant   Name: [name]  MRN: [id number] DOB: [birth year]  Reason for Encounter: Disease State- Diabetes  Patient Question:  1.  Have you seen any other providers or had any medication changes since your last visit with [name], Pharm. D? Yes  05/20/20- Televisit- [name]- PCP 05/18/20- ED visit- Fever 05/11/20- Dr. Ferdie Ping    PCP : [name], MD  Allergies:   Allergies  Allergen Reactions   Sulfa Antibiotics Nausea Only    Medications: Outpatient Encounter Medications as of 05/22/2020  Medication Sig   amoxicillin (AMOXIL) 500 MG capsule Take 2 capsules (1,000 mg total) by mouth 2 (two) times daily for 14 days.   aspirin EC 81 MG tablet Take 81 mg by mouth daily.   clarithromycin (BIAXIN) 500 MG tablet Take 1 tablet (500 mg total) by mouth 2 (two) times daily.   diphenhydrAMINE (BENADRYL) 25 MG tablet Take 25 mg by mouth 2 (two) times daily.   donepezil (ARICEPT) 10 MG tablet TAKE 1 TABLET BY MOUTH EVERYDAY AT BEDTIME   glipiZIDE (GLUCOTROL) 5 MG tablet TAKE 1 TABLET BY MOUTH EVERY DAY BEFORE BREAKFAST   Multiple Vitamins-Minerals (ICAPS LUTEIN & ZEAXANTHIN PO) Take 1 capsule by mouth 2 (two) times daily.    Omega-3 Fatty Acids (FISH OIL) 1000 MG CAPS Take 1,000 mg by mouth in the morning and at bedtime.    omeprazole (PRILOSEC) 20 MG capsule Take 20 mg by mouth 2 (two) times daily before a meal.   sertraline (ZOLOFT) 100 MG tablet TAKE 1 TABLET BY MOUTH EVERY DAY   No facility-administered encounter medications on file as of 05/22/2020.    Current Diagnosis: Patient Active Problem List   Diagnosis Date Noted   Sensorineural hearing loss (SNHL) of both ears 05/15/2017   Tinnitus, bilateral 05/15/2017   Macular degeneration of both eyes 01/21/2014   Tobacco abuse, 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/08/2013   CAD (coronary artery disease)    Pericardial tamponade 09/17/2012   Acute pericarditis, unspecified 09/13/2012    Adenocarcinoma of left lung, stage 1 ([place]) 07/25/2011   Cancer of prostate w/med recur risk (T2b-c or Gleason 7 or PSA 10-20) ([place]) 07/25/2011   Nonmelanoma skin cancer 07/25/2011   History of colonic polyps 07/25/2011   Type 2 diabetes mellitus with vascular disease ([place]) 05/21/2007   HYPERCHOLESTEROLEMIA 05/21/2007   CARCINOMA, SKIN, SQUAMOUS CELL 01/08/2007   Generalized anxiety disorder 01/08/2007   ALZHEIMER'S DISEASE, MILD 01/08/2007   Essential hypertension 01/08/2007    Recent Relevant Labs: Lab Results  Component Value Date/Time   HGBA1C 7.3 (H) 05/11/2020 10:31 AM   HGBA1C 7.0 (H) 12/20/2019 09:20 AM   MICROALBUR 7.8 (H) 12/20/2019 09:20 AM   MICROALBUR 3.4 (H) 10/31/2018 10:39 AM    Kidney Function Lab Results  Component Value Date/Time   CREATININE 1.87 (H) 05/18/2020 12:58 PM   CREATININE 1.69 (H) 05/14/2020 10:15 AM   CREATININE 1.5 (H) 07/30/2015 09:57 AM   CREATININE 1.2 07/16/2013 10:59 AM   GFR 36.04 (L) 05/14/2020 10:15 AM   GFRNONAA 34 (L) 05/18/2020 12:58 PM   GFRAA 58 (L) 06/25/2018 09:49 PM    Current antihyperglycemic regimen:  Metformin 500 mg - 1 tablet daily with breakfast (Discontinued January 2022) Glipizide 5 mg - 1 tablet daily before breakfast  What recent interventions/DTPs have been made to improve glycemic control:  Patient discontinued metformin due to diarrhea around early January. Not checking blood sugar- states [name] is not concerned due to his age.  Have there been any recent hospitalizations or ED visits since last visit with CPP? Yes  05/18/20- Fever  Patient denies hypoglycemic symptoms, including Pale, Sweaty, Shaky, Hungry, Nervous/irritable and Vision changes  Patient denies hyperglycemic symptoms, including blurry vision, excessive thirst, fatigue, polyuria and weakness  How often are you checking your blood sugar? Does not check.   What are your blood sugars ranging? N/A  On insulin? No  During the week, how  often does your blood glucose drop below 70? Unknown  Are you checking your feet daily/regularly? Yes  Adherence Review: Is the patient currently on a STATIN medication? No Is the patient currently on ACE/ARB medication? No  Wife states her son checked [name]'s blood pressure last night 05/21/20 and it was perfect but she can not recall what it was. States patients headaches are improving. They are encouraging water intake. Patient is still very weak. Mostly sedentary has not been exercising due to illness. Wife states patients fever is improving as is his appetite.    Follow-Up:  Pharmacist Review  [name], CPP notified  Margaretmary Dys, [place] Assistant [phone]  I have reviewed the care management and care coordination activities outlined in this encounter and I am certifying that I agree with the content of this note. Agree as long as he is not having any hypoglycemia, okay not to monitor BG routinely. Caution with skipped meals or weight loss due to risk of hypoglycemia with glipizide.  [name], PharmD Clinical Pharmacist [facility] [phone]

## 2020-05-23 LAB — CULTURE, BLOOD (ROUTINE X 2): Culture: NO GROWTH

## 2020-06-01 ENCOUNTER — Other Ambulatory Visit: Payer: Self-pay | Admitting: Family Medicine

## 2020-06-01 DIAGNOSIS — E119 Type 2 diabetes mellitus without complications: Secondary | ICD-10-CM | POA: Diagnosis not present

## 2020-06-01 NOTE — Telephone Encounter (Signed)
No this was for H. Pylori eradication

## 2020-06-01 NOTE — Telephone Encounter (Signed)
Do you want [name] to continue the omeprazole?

## 2020-06-04 ENCOUNTER — Encounter: Payer: Self-pay | Admitting: Dermatology

## 2020-06-04 ENCOUNTER — Other Ambulatory Visit: Payer: Self-pay

## 2020-06-04 ENCOUNTER — Ambulatory Visit: Payer: Medicare HMO | Admitting: Dermatology

## 2020-06-04 DIAGNOSIS — C44329 Squamous cell carcinoma of skin of other parts of face: Secondary | ICD-10-CM

## 2020-06-04 DIAGNOSIS — H353221 Exudative age-related macular degeneration, left eye, with active choroidal neovascularization: Secondary | ICD-10-CM | POA: Diagnosis not present

## 2020-06-04 DIAGNOSIS — D485 Neoplasm of uncertain behavior of skin: Secondary | ICD-10-CM

## 2020-06-04 DIAGNOSIS — D0439 Carcinoma in situ of skin of other parts of face: Secondary | ICD-10-CM | POA: Diagnosis not present

## 2020-06-04 NOTE — Patient Instructions (Signed)

## 2020-06-09 ENCOUNTER — Telehealth: Payer: Self-pay

## 2020-06-09 ENCOUNTER — Ambulatory Visit
Admission: EM | Admit: 2020-06-09 | Discharge: 2020-06-09 | Disposition: A | Discharge status: Home / Self Care | Payer: Medicare HMO | Attending: Family Medicine | Admitting: Family Medicine

## 2020-06-09 ENCOUNTER — Ambulatory Visit (INDEPENDENT_AMBULATORY_CARE_PROVIDER_SITE_OTHER): Payer: Medicare HMO

## 2020-06-09 ENCOUNTER — Other Ambulatory Visit: Payer: Self-pay

## 2020-06-09 ENCOUNTER — Encounter: Payer: Self-pay | Admitting: Emergency Medicine

## 2020-06-09 DIAGNOSIS — W19XXXA Unspecified fall, initial encounter: Secondary | ICD-10-CM | POA: Diagnosis not present

## 2020-06-09 DIAGNOSIS — S59901A Unspecified injury of right elbow, initial encounter: Secondary | ICD-10-CM | POA: Diagnosis not present

## 2020-06-09 DIAGNOSIS — M25521 Pain in right elbow: Secondary | ICD-10-CM

## 2020-06-09 NOTE — ED Provider Notes (Signed)
[name]    CSN: [id number] Arrival date & time: 06/09/20  1024      History   Chief Complaint Chief Complaint  Patient presents with  . Fall  . Abrasion    Right elbow    HPI [name] is a 87 y.o. male.   Patient is a 87 year old male who presents today with right elbow pain, abrasion.  Reported he fell yesterday outside on the concrete striking the right elbow.  Denies hitting head or any loss of consciousness.  Takes aspirin daily.  Bleeding controlled and normal range of motion.  He did clean the wound and wrapped prior to coming here.     Past Medical History:  Diagnosis Date  . Adenocarcinoma of left lung, stage 1 ([place]) 2001   T1N0 stage I  adenoca left lung resected 01/03/00   . Alzheimer's disease ([place])   . CAD (coronary artery disease) 2014   a. 09/2012 Cath: LM nl, LAD 50-60p, D1 60-70 m, D1 50ost, LCX nl, RCA min irregs, EF 55-65%.  . Generalized anxiety disorder 01/08/2007   Qualifier: Diagnosis of  By: Diona Browner MD, [name]    . History of colonic polyps 07/25/2011  . Macular degeneration of both eyes 01/21/2014  . Nonmelanoma skin cancer 07/25/2011   Multiple lesions excised face/nose  . Pericarditis    a. 09/2012 with effusion and tamponade, s/p window.  b.  F/u Echo 09/19/12: mod LVH, EF 55%, Gr 1 DD, Tr MR, mild RVE, no residual effusion  . Prostate CA ([place]) 04/2001   Gleason 7  S/P prostatectomy 04/11/01  . 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 SCC (squamous cell carcinoma) 02/18/2013   BELOW LEFT EYE SCC IN SITU TX WITH BX  . SCC (squamous cell carcinoma) 08/27/2008   RIGHT OUTER BROW FOCAL IN SITU TX WITH BX  . SCC (squamous cell carcinoma) 08/27/2008   BELOW LEFT EYE FOCAL IN SITU TX CX3 5FU  . SCC (squamous cell carcinoma) 10/25/2006   RIGHT ELBOW SCC IN SITU TX WITH BX CX3 5FU  . SCC (squamous cell carcinoma) 04/12/2005   RIGHT NECK INF. SCC IN SITU TX CX3  . SCC  (squamous cell carcinoma) 04/12/2005   RIGHT NECK SUP. SCC IN SITU TX EXC  . SCC (squamous cell carcinoma) 04/16/2013   LEFT TEMPLE SCC IN SITU TX CX3 5FU  . SCC (squamous cell carcinoma) 04/16/2013   BELOW LEFT EYE SCC IN SITU TX CX3 5FU  . SCC (squamous cell carcinoma) 04/16/2013   BELOW RIGHT EYE SCC IN SITU TX CX3 5FU  . SCC (squamous cell carcinoma) 08/04/2014   LEFT CHEEK SCC IN SITU TX CX3 5FU  . SCC (squamous cell carcinoma) 11/27/2018   LEFT TEMPLE SCC IN SITU TX WITH BX  . SCC (squamous cell carcinoma)   . SCC (squamous cell carcinoma)   . SCC (squamous cell carcinoma)   . SCC (squamous cell carcinoma) Well Diff 09/13/2016   Tip of Nose SCC WELL DIFF TX (MOH's), and RIGHT INNER EYE SUP. SCC IN SITU TX TO WATCH  . SCC (squamous cell carcinoma) Well Diff 05/30/2017   Right Cheekbone (Cx3,5FU) and Under Left Eye (Cx3,5FU)  . Squamous cell carcinoma in situ (SCCIS) 04/12/2005   Right Neck Inf (Cx3), Right Neck Sup (Exc), and Bulb of Nose (Cx3,Exc)  . Squamous cell carcinoma in situ (SCCIS) 09/27/2005   Nose (Cx3,Exc)  .  Squamous cell carcinoma in situ (SCCIS) 02/18/2013   Below Left Eye (tx p bx)  . Squamous cell carcinoma in situ (SCCIS) 04/16/2013   Left Temple (Cx3,5FU), Below Left Eye (Cx3,5FU), Below Right Eye (Cx3,5FU)  . Squamous cell carcinoma in situ (SCCIS) 08/04/2014   Left Cheek (Cx3,5FU)  . Squamous cell carcinoma in situ (SCCIS) 09/13/2016   Right Inner Eye Sup. ([place])  . Squamous cell carcinoma in situ (SCCIS) Focal 08/24/2008   Right Outer Brow (tx p bx) and Below Left Eye (Cx3,5FU)  . Squamous cell carcinoma in situ (SCCIS) Hypertrophic 10/25/2006   Right Elbow (Cx3,5FU)  . Squamous cell carcinoma of skin 09/27/2005   NOSE SCC IN SITU TC CX3 5FU  . 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 Type 2 DM with CKD stage 3 and hypertension ([place])  07/16/2015  . Unspecified essential hypertension     Patient Active Problem List   Diagnosis Date Noted  . Sensorineural hearing loss (SNHL) of both ears 05/15/2017  . Tinnitus, bilateral 05/15/2017  . Macular degeneration of both eyes 01/21/2014  . 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 CAD (coronary artery disease)   . Pericardial tamponade 09/17/2012  . Acute pericarditis, unspecified 09/13/2012  . Adenocarcinoma of left lung, stage 1 ([place]) 07/25/2011  . Cancer of prostate w/med recur risk (T2b-c or Gleason 7 or PSA 10-20) ([place]) 07/25/2011  . Nonmelanoma skin cancer 07/25/2011  . History of colonic polyps 07/25/2011  . 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 HYPERCHOLESTEROLEMIA 05/21/2007  . CARCINOMA, SKIN, SQUAMOUS CELL 01/08/2007  . Generalized anxiety disorder 01/08/2007  . ALZHEIMER'S DISEASE, MILD 01/08/2007  . Essential hypertension 01/08/2007    Past Surgical History:  Procedure Laterality Date  . HERNIA REPAIR    . LEFT HEART CATHETERIZATION WITH CORONARY ANGIOGRAM N/A 09/13/2012   Procedure: LEFT HEART CATHETERIZATION WITH CORONARY ANGIOGRAM;  Surgeon: Sherren Mocha, MD;  [facility] C[facility] LAB;  Service: Cardiovascular;  Laterality: N/A;  . LOBECTOMY  2001   upper left  . PERICARDIAL TAP N/A 09/16/2012   Procedure: PERICARDIAL TAP;  Surgeon: Sinclair Grooms, MD;  Location: [facility] CATH LAB;  Service: Cardiovascular;  Laterality: N/A;  . PROSTATECTOMY  2003  . SUBXYPHOID PERICARDIAL WINDOW N/A 09/16/2012   Procedure: SUBXYPHOID PERICARDIAL WINDOW;  Surgeon: Rexene Alberts, MD;  Location: [facility] OR;  Service: Thoracic;  Laterality: N/A;  . TONSILLECTOMY         Home Medications    Prior to Admission medications   Medication Sig Start Date End Date Taking? Authorizing Provider  aspirin EC 81 MG tablet Take 81 mg by mouth daily.   Yes [provider]  donepezil (ARICEPT) 10 MG tablet TAKE 1 TABLET BY MOUTH EVERYDAY AT BEDTIME  02/25/20  Yes Copland, Spencer, MD  glipiZIDE (GLUCOTROL) 5 MG tablet TAKE 1 TABLET BY MOUTH EVERY DAY BEFORE BREAKFAST 01/15/20  Yes Copland, Frederico Hamman, MD  Multiple Vitamins-Minerals (ICAPS LUTEIN & ZEAXANTHIN PO) Take 1 capsule by mouth 2 (two) times daily.    Yes [provider]  Omega-3 Fatty Acids (FISH OIL) 1000 MG CAPS Take 1,000 mg by mouth in the morning and at bedtime.    Yes [provider]  omeprazole (PRILOSEC) 20 MG capsule Take 20 mg by mouth 2 (two) times daily before a meal.   Yes [provider]  sertraline (ZOLOFT) 100 MG tablet TAKE 1 TABLET BY MOUTH EVERY DAY 01/21/20  Yes Copland, Frederico Hamman, MD  diphenhydrAMINE (BENADRYL) 25 MG tablet Take 25 mg by mouth 2 (two) times daily.    [provider]    Family History Family History  Problem Relation Age of Onset  . Cancer Father        colon  . Dementia Mother   . Diabetes Mother   . Cancer Brother        lung    Social History Social History   Tobacco Use  . Smoking status: Former Smoker    Packs/day: 2.00    Years: 42.00    Pack years: 84.00    Types: Cigarettes    Start date: 02/25/1953    Quit date: 04/04/1984    Years since quitting: 36.2  . Smokeless tobacco: Former Systems developer    Types: Chew    Quit date: 07/27/1986  . Tobacco comment: quit tobacco 26 years ago  Vaping Use  . Vaping Use: Never used  Substance Use Topics  . Alcohol use: No    Alcohol/week: 0.0 standard drinks  . Drug use: No     Allergies   Sulfa antibiotics   Review of Systems Review of Systems   Physical Exam Triage Vital Signs ED Triage Vitals  Enc Vitals Group     BP 06/09/20 1037 (!) 155/82     Pulse Rate 06/09/20 1037 62     Resp 06/09/20 1037 20     Temp 06/09/20 1037 (!) 97.2 F (36.2 C)     Temp Source 06/09/20 1037 Tympanic     SpO2 06/09/20 1037 95 %     Weight --      Height --      Head Circumference --      Peak Flow --      Pain Score 06/09/20 1038 0     Pain Loc --       Pain Edu? --      Excl. in [place]? --    No data found.  Updated Vital Signs BP (!) 155/82 (BP Location: Left Arm)   Pulse 62   Temp (!) 97.2 F (36.2 C) (Tympanic)   Resp 20   SpO2 95%   Visual Acuity Right Eye Distance:   Left Eye Distance:   Bilateral Distance:    Right Eye Near:   Left Eye Near:    Bilateral Near:     Physical Exam Vitals and nursing note reviewed.  Constitutional:      Appearance: Normal appearance.  HENT:     Head: Normocephalic and atraumatic.  Eyes:     Conjunctiva/sclera: Conjunctivae normal.  Pulmonary:     Effort: Pulmonary effort is normal.  Musculoskeletal:        General: Normal range of motion.     Right elbow: No swelling, deformity, effusion or lacerations. Normal range of motion. Tenderness present in lateral epicondyle and olecranon process.     Cervical back: Normal range of motion.  Skin:    General: Skin is warm and dry.     Comments: Abrasion and skin tear to the right upper arm and elbow.  Bleeding controlled.   Neurological:     Mental Status: He is alert.  Psychiatric:        Mood and Affect: Mood normal.      UC Treatments / Results  Labs (all labs ordered are listed, but only abnormal results are displayed) Labs Reviewed -  No data to display  EKG   Radiology DG Elbow Complete Right  Result Date: 06/09/2020 CLINICAL DATA:  Trauma with pain. EXAM: RIGHT ELBOW - COMPLETE 3+ VIEW COMPARISON:  None. FINDINGS: No acute fracture or dislocation. There may be mild pre olecranon soft tissue swelling. Mild degenerative changes about the coronoid process of the ulna. No joint effusion. IMPRESSION: No acute osseous abnormality. Electronically Signed   By: Abigail Miyamoto M.D.   On: 06/09/2020 11:11    Procedures Procedures (including critical care time)  Medications Ordered in UC Medications - No data to display  Initial Impression / Assessment and Plan / UC Course  I have reviewed the triage vital signs and the nursing  notes.  Pertinent labs & imaging results that were available during my care of the patient were reviewed by me and considered in my medical decision making (see chart for details).     Right elbow injury No acute fracture on xray.  Cleaned wound here and wrapped.  Follow up as needed for continued or worsening symptoms  Final Clinical Impressions(s) / UC Diagnoses   Final diagnoses:  Injury of right elbow, initial encounter     Discharge Instructions     Your x ray was normal Keep the wounds clean and dry. Follow up as needed for continued or worsening symptoms     ED Prescriptions    None     PDMP not reviewed this encounter.   Orvan July, NP 06/09/20 1157

## 2020-06-09 NOTE — Telephone Encounter (Signed)
[name] said pt's wife called and pt fell last night; was sent to access nurse who said pt needed to be seen; no available appts at [facility] and pt's wife was cold transferred to [place], I spoke with Mrs [name] who said pt fell last night about 8:30 pm in the yard; pt did not hit his head but slid on his rt arm and Mrs [name] said pt has abrasion 2 1/2 inches above rt elbow to 3 inches below the elbow. Mrs [name] does not think there is a laceration but pt bled a lot and Mrs [name] tried to dress the arm last night and blood has come thru the dressing. Mrs [name] said that she will take pt to [place] now last Tdap was 01/25/13. Sending note to Dr Lorelei Pont as Juluis Rainier.

## 2020-06-09 NOTE — Telephone Encounter (Signed)
[facility] - Client TELEPHONE ADVICE RECORD AccessNurse Patient Name: [name] Gender: Male DOB: 09-08-1930 Age: 87 Y 36 M 22 D Return Phone Number: [phone] (Primary), [phone] (Secondary) Address[place] [address] Client [place] [facility] - Client Client Site [place] - Day Physician Copland, Frederico Hamman - MD Contact Type Call Who Is Calling Patient / Member / Family / Caregiver Call Type Triage / Clinical Caller Name Donzell Coller Relationship To Patient Spouse Return Phone Number [phone] (Secondary) Chief Complaint Abrasion Reason for Call Symptomatic / Request for [place] states that her husband fell last night, scraped his arm. He slipped on cement steps and the scrape is from his elbow down his forearm, she bandaged it and would like to have someone look at it and dress it properly. Translation No Nurse Assessment Nurse: Rodney Cruise, RN, [place] Date/Time (Eastern Time): 06/09/2020 9:18:38 AM Confirm and document reason for call. If symptomatic, describe symptoms. ---Caller states client scrapped right arm from above elbow about 6 inches long, is wide area, would like for someone to take a look at it and make sure it is ok. Denies any redness or swelling outside of dressing currently. Does the patient have any new or worsening symptoms? ---Yes Will a triage be completed? ---Yes Related visit to physician within the last 2 weeks? ---No Does the PT have any chronic conditions? (i.e. diabetes, asthma, this includes High risk factors for pregnancy, etc.) ---No Is this a behavioral health or substance abuse call? ---No Guidelines Guideline Title Affirmed Question Affirmed Notes Nurse Date/Time (Eastern Time) Falls and Falling Small cut (scratch) or abrasion (scrape) is also present [employer], RN, Sean 06/09/2020 9:23:45 AM Disp. Time  Eilene Ghazi Time) Disposition Final User 06/09/2020 9:26:46 AM Call PCP within 24 Hours Yes Baxter, RN, [place] Disposition Overriden: Home Care Override Reason: Patient's symptoms need a higher level of care PLEASE NOTE: All timestamps contained within this report are represented as [place] Standard Time. CONFIDENTIALTY NOTICE: This fax transmission is intended only for the addressee. It contains information that is legally privileged, confidential or otherwise protected from use or disclosure. If you are not the intended recipient, you are strictly prohibited from reviewing, disclosing, copying using or disseminating any of this information or taking any action in reliance on or regarding this information. If you have received this fax in error, please notify us immediately by telephone so that we can arrange for its return to [place]. Phone: [phone], Toll-Free: [phone], Fax: [phone] Page: 2 of 2 Call Id: [id number] Oto Disagree/Comply Comply Caller Understands Yes PreDisposition Did not know what to do Care Advice Given Per Guideline CALL PCP WITHIN 24 HOURS: CARE ADVICE given per Fall and Falling (Adult) guideline. Referrals REFERRED TO PCP OFFICE

## 2020-06-09 NOTE — Discharge Instructions (Signed)
Your x ray was normal Keep the wounds clean and dry. Follow up as needed for continued or worsening symptoms

## 2020-06-09 NOTE — ED Triage Notes (Signed)
Pt presents today with caregiver who reports that he tripped yesterday outside onto concrete striking right elbow.

## 2020-06-09 NOTE — Telephone Encounter (Signed)
Noted.  With 87 yo, at least visualization helpful.

## 2020-06-16 ENCOUNTER — Telehealth: Payer: Self-pay

## 2020-06-16 NOTE — Telephone Encounter (Signed)
-----   Message from Lavonna Monarch, MD sent at 06/11/2020  8:37 PM EST ----- Multiple nonmelanoma skin cancers.  If possible schedule as last patient of the morning.

## 2020-06-16 NOTE — Telephone Encounter (Signed)
Path to patient 5/19 end of day made

## 2020-06-17 ENCOUNTER — Telehealth: Payer: Self-pay | Admitting: Dermatology

## 2020-06-17 NOTE — Telephone Encounter (Signed)
Patient's wife left message on office voice mail saying that she was calling for pathology results from last visit with Lavonna Monarch, MD.

## 2020-06-17 NOTE — Telephone Encounter (Signed)
Left message for patient to return our phone call.

## 2020-06-17 NOTE — Telephone Encounter (Signed)
Returned call, probably about result.

## 2020-06-22 ENCOUNTER — Encounter: Payer: Self-pay | Admitting: Dermatology

## 2020-06-25 DIAGNOSIS — H353211 Exudative age-related macular degeneration, right eye, with active choroidal neovascularization: Secondary | ICD-10-CM | POA: Diagnosis not present

## 2020-07-02 DIAGNOSIS — H353211 Exudative age-related macular degeneration, right eye, with active choroidal neovascularization: Secondary | ICD-10-CM | POA: Diagnosis not present

## 2020-07-12 NOTE — Progress Notes (Signed)
Follow-Up Visit   Subjective  [name] is a 87 y.o. male who presents for the following: Follow-up (Isk returned per patient and has a few spots that are new on the face).  New crust on face Location:  Duration:  Quality:  Associated Signs/Symptoms: Modifying Factors:  Severity:  Timing: Context:   Objective  Well appearing patient in no apparent distress; mood and affect are within normal limits. Objective  Left Parotid Area superior: Focally eroded 1.4 cm waxy crust     Objective  Left Malar Cheek: Plaque-like 2.2 cm crust     Objective  Left Temple: Focally eroded hemorrhagic 1.3 cm crust contiguous with white scar.  After shave biopsy the lesion was curetted and cauterized.     Objective  Right Zygomatic Area: Previous biopsy in area reported to seborrheic keratosis, but this lesion is more of a waxy pink crust suggestive of carcinoma.       A focused examination was performed including Head and neck.. Relevant physical exam findings are noted in the Assessment and Plan.   Assessment & Plan    Neoplasm of uncertain behavior of skin (4) Left Parotid Area superior  Skin / nail biopsy Type of biopsy: tangential   Informed consent: discussed and consent obtained   Timeout: patient name, date of birth, surgical site, and procedure verified   Procedure prep:  Patient was prepped and draped in usual sterile fashion (Non sterile) Prep type:  Chlorhexidine Anesthesia: the lesion was anesthetized in a standard fashion   Anesthetic:  1% lidocaine w/ epinephrine 1-100,000 local infiltration Instrument used: flexible razor blade   Outcome: patient tolerated procedure well   Post-procedure details: wound care instructions given    Specimen 1 - Surgical pathology Differential Diagnosis: bcc vs scc - (2 specimens) BOTTLE 2 - NOT MISSING SENT SPECIMEN IN BOTTLE 1 Check Margins: No  Left Malar Cheek  Skin / nail biopsy Type of biopsy:  tangential   Informed consent: discussed and consent obtained   Timeout: patient name, date of birth, surgical site, and procedure verified   Procedure prep:  Patient was prepped and draped in usual sterile fashion (Non sterile) Prep type:  Chlorhexidine Anesthesia: the lesion was anesthetized in a standard fashion   Anesthetic:  1% lidocaine w/ epinephrine 1-100,000 local infiltration Instrument used: flexible razor blade   Outcome: patient tolerated procedure well   Post-procedure details: wound care instructions given    Specimen 3 - Surgical pathology Differential Diagnosis: bcc vs scc  Check Margins: No  Left Temple  Skin / nail biopsy Type of biopsy: tangential   Informed consent: discussed and consent obtained   Timeout: patient name, date of birth, surgical site, and procedure verified   Procedure prep:  Patient was prepped and draped in usual sterile fashion (Non sterile) Prep type:  Chlorhexidine Anesthesia: the lesion was anesthetized in a standard fashion   Anesthetic:  1% lidocaine w/ epinephrine 1-100,000 local infiltration Instrument used: flexible razor blade   Outcome: patient tolerated procedure well   Post-procedure details: wound care instructions given    Destruction of lesion Complexity: simple   Destruction method: electrodesiccation and curettage   Informed consent: discussed and consent obtained   Timeout:  patient name, date of birth, surgical site, and procedure verified Anesthesia: the lesion was anesthetized in a standard fashion   Anesthetic:  1% lidocaine w/ epinephrine 1-100,000 local infiltration Curettage performed in three different directions: Yes   Curettage cycles:  3 Lesion length (cm):  1.2 Lesion width (cm):  1.2 Margin per side (cm):  0 Final wound size (cm):  1.2 Hemostasis achieved with:  aluminum chloride Outcome: patient tolerated procedure well with no complications   Post-procedure details: wound care instructions given     Specimen 4 - Surgical pathology Differential Diagnosis: bcc vs scc  Check Margins: No  Right Zygomatic Area  Skin / nail biopsy Type of biopsy: tangential   Informed consent: discussed and consent obtained   Timeout: patient name, date of birth, surgical site, and procedure verified   Procedure prep:  Patient was prepped and draped in usual sterile fashion (Non sterile) Prep type:  Chlorhexidine Anesthesia: the lesion was anesthetized in a standard fashion   Anesthetic:  1% lidocaine w/ epinephrine 1-100,000 local infiltration Instrument used: flexible razor blade   Outcome: patient tolerated procedure well   Post-procedure details: wound care instructions given    Specimen 5 - Surgical pathology Differential Diagnosis: bcc vs scc  Check Margins: No      I, Lavonna Monarch, MD, have reviewed all documentation for this visit.  The documentation on 07/12/20 for the exam, diagnosis, procedures, and orders are all accurate and complete.

## 2020-07-30 DIAGNOSIS — H353211 Exudative age-related macular degeneration, right eye, with active choroidal neovascularization: Secondary | ICD-10-CM | POA: Diagnosis not present

## 2020-08-06 DIAGNOSIS — H353211 Exudative age-related macular degeneration, right eye, with active choroidal neovascularization: Secondary | ICD-10-CM | POA: Diagnosis not present

## 2020-08-20 ENCOUNTER — Other Ambulatory Visit: Payer: Self-pay

## 2020-08-20 ENCOUNTER — Ambulatory Visit (INDEPENDENT_AMBULATORY_CARE_PROVIDER_SITE_OTHER): Payer: Medicare HMO | Admitting: Dermatology

## 2020-08-20 DIAGNOSIS — C44329 Squamous cell carcinoma of skin of other parts of face: Secondary | ICD-10-CM | POA: Diagnosis not present

## 2020-08-20 DIAGNOSIS — C4492 Squamous cell carcinoma of skin, unspecified: Secondary | ICD-10-CM

## 2020-08-20 DIAGNOSIS — D099 Carcinoma in situ, unspecified: Secondary | ICD-10-CM

## 2020-08-20 DIAGNOSIS — D0439 Carcinoma in situ of skin of other parts of face: Secondary | ICD-10-CM

## 2020-08-20 NOTE — Patient Instructions (Signed)

## 2020-09-01 DIAGNOSIS — H353221 Exudative age-related macular degeneration, left eye, with active choroidal neovascularization: Secondary | ICD-10-CM | POA: Diagnosis not present

## 2020-09-03 ENCOUNTER — Ambulatory Visit (INDEPENDENT_AMBULATORY_CARE_PROVIDER_SITE_OTHER): Payer: Medicare HMO

## 2020-09-03 ENCOUNTER — Other Ambulatory Visit: Payer: Self-pay

## 2020-09-03 DIAGNOSIS — E1159 Type 2 diabetes mellitus with other circulatory complications: Secondary | ICD-10-CM

## 2020-09-03 DIAGNOSIS — I10 Essential (primary) hypertension: Secondary | ICD-10-CM

## 2020-09-03 NOTE — Patient Instructions (Signed)
Dear [name],  Below is a summary of the goals we discussed during our follow up appointment on September 03, 2020. Please contact me anytime with questions or concerns.   Visit Information  Patient Care Plan: CCM Pharmacy Care Plan    Problem Identified: CHL AMB "PATIENT-SPECIFIC PROBLEM"     Long-Range Goal: Disease Management   Start Date: 09/03/2020  Priority: High  Note:   Current Barriers:  . Frequent minor falls  . Concern for hypoglycemia   Pharmacist Clinical Goal(s):  Marland Kitchen Patient will contact provider office for questions/concerns as evidenced notation of same in electronic health record through collaboration with PharmD and provider.   Interventions: . 1:1 collaboration with [name], MD regarding development and update of comprehensive plan of care as evidenced by provider attestation and co-signature . Inter-disciplinary care team collaboration (see longitudinal plan of care) . Comprehensive medication review performed; medication list updated in electronic medical record  Hypertension (BP goal <140/90) -Not ideally controlled - per clinic readings elevated -Current treatment: . None  -Medications previously tried: none reported  -Current home readings: He has not checked BP recently.   -We discussed home monitoring due to last 3 clinic readings elevated. -Denies hypotensive/hypertensive symptoms -Educated on Importance of home blood pressure monitoring; Proper BP monitoring technique; -Counseled to monitor BP at home weekly, document, and provide log at future appointments  Diabetes (A1c goal <8%) -Not ideally controlled - some concern of hypoglycemia  -Current medications: Marland Kitchen Glipizide 5 mg - 1 tablet daily before breakfast -Medications previously tried: metformin   -Current home glucose readings - checks fasting sugar about once per week . fasting glucose: ~150 (per memory, no log) . post prandial glucose: none  -Reports hypoglycemic/hyperglycemic  symptoms -Per patient's wife his BG was 69 several days over the past few months. He drinks orange juice and ate a snack and it returned to normal. She reports he eats lots of sweets.-We discussed hypoglycemia risk with glipizide and importance of routine BG monitoring. -Educated on Prevention and management of hypoglycemic episodes; -Counseled to check feet daily and get yearly eye exams - Denies any problems with his feet. Denies numbness, cuts or pain.  Reports eye examcompleted this past month.  -Recommended hold glipzide if not eating breakfast ; Call if any BG < 70. Check sugar with any abnormal symptoms.   Depression/Anxiety (Goal: Improve mood) -Controlled - per patient report -Current treatment: . Sertraline 100 mg - 1 tablet daily  -Medications previously tried/failed: none reported -Denies alcohol or tobacco use -Recommended to continue current medication  Patient Goals/Self-Care Activities . Patient will:  - check glucose daily, document, and provide at future appointments check blood pressure weekly, document, and provide at future appointments  Follow Up Plan: The care management team will reach out to the patient again over the next 30 days.       The patient verbalized understanding of instructions, educational materials, and care plan provided today and agreed to receive a mailed copy of patient instructions, educational materials, and care plan.   [name], PharmD Clinical Pharmacist [facility] at [facility] [phone]   Fall Prevention in the Home, Adult Falls can cause injuries and can happen to people of all ages. There are many things you can do to make your home safe and to help prevent falls. Ask for help when making these changes. What actions can I take to prevent falls? General Instructions  Use good lighting in all rooms. Replace any light bulbs that  burn out.  Turn on the lights in dark areas. Use night-lights.  Keep items that  you use often in easy-to-reach places. Lower the shelves around your home if needed.  Set up your furniture so you have a clear path. Avoid moving your furniture around.  Do not have throw rugs or other things on the floor that can make you trip.  Avoid walking on wet floors.  If any of your floors are uneven, fix them.  Add color or contrast paint or tape to clearly mark and help you see: ? Grab bars or handrails. ? First and last steps of staircases. ? Where the edge of each step is.  If you use a stepladder: ? Make sure that it is fully opened. Do not climb a closed stepladder. ? Make sure the sides of the stepladder are locked in place. ? Ask someone to hold the stepladder while you use it.  Know where your pets are when moving through your home. What can I do in the bathroom?  Keep the floor dry. Clean up any water on the floor right away.  Remove soap buildup in the tub or shower.  Use nonskid mats or decals on the floor of the tub or shower.  Attach bath mats securely with double-sided, nonslip rug tape.  If you need to sit down in the shower, use a plastic, nonslip stool.  Install grab bars by the toilet and in the tub and shower. Do not use towel bars as grab bars.      What can I do in the bedroom?  Make sure that you have a light by your bed that is easy to reach.  Do not use any sheets or blankets for your bed that hang to the floor.  Have a firm chair with side arms that you can use for support when you get dressed. What can I do in the kitchen?  Clean up any spills right away.  If you need to reach something above you, use a step stool with a grab bar.  Keep electrical cords out of the way.  Do not use floor polish or wax that makes floors slippery. What can I do with my stairs?  Do not leave any items on the stairs.  Make sure that you have a light switch at the top and the bottom of the stairs.  Make sure that there are handrails on both sides  of the stairs. Fix handrails that are broken or loose.  Install nonslip stair treads on all your stairs.  Avoid having throw rugs at the top or bottom of the stairs.  Choose a carpet that does not hide the edge of the steps on the stairs.  Check carpeting to make sure that it is firmly attached to the stairs. Fix carpet that is loose or worn. What can I do on the outside of my home?  Use bright outdoor lighting.  Fix the edges of walkways and driveways and fix any cracks.  Remove anything that might make you trip as you walk through a door, such as a raised step or threshold.  Trim any bushes or trees on paths to your home.  Check to see if handrails are loose or broken and that both sides of all steps have handrails.  Install guardrails along the edges of any raised decks and porches.  Clear paths of anything that can make you trip, such as tools or rocks.  Have leaves, snow, or ice cleared regularly.  Use sand or salt on paths during winter.  Clean up any spills in your garage right away. This includes grease or oil spills. What other actions can I take?  Wear shoes that: ? Have a low heel. Do not wear high heels. ? Have rubber bottoms. ? Feel good on your feet and fit well. ? Are closed at the toe. Do not wear open-toe sandals.  Use tools that help you move around if needed. These include: ? Canes. ? Walkers. ? Scooters. ? Crutches.  Review your medicines with your doctor. Some medicines can make you feel dizzy. This can increase your chance of falling. Ask your doctor what else you can do to help prevent falls. Where to find more information  Centers for Disease Control and Prevention, STEADI: http://www.wolf.info/  National Institute on Aging: http://kim-miller.com/ Contact a doctor if:  You are afraid of falling at home.  You feel weak, drowsy, or dizzy at home.  You fall at home. Summary  There are many simple things that you can do to make your home safe and to help  prevent falls.  Ways to make your home safe include removing things that can make you trip and installing grab bars in the bathroom.  Ask for help when making these changes in your home. This information is not intended to replace advice given to you by your health care provider. Make sure you discuss any questions you have with your health care provider. Document Revised: 10/23/2019 Document Reviewed: 10/23/2019 Elsevier Patient Education  [place].

## 2020-09-03 NOTE — Progress Notes (Signed)
Chronic Care Management Pharmacy Note  09/03/2020 Name:  [name] MRN:  [id number] DOB:  [birth year]  Summary: No longer taking aspirin (due to falls), omeprazole (comleted course), or Benadryl (no longer needed). Removed from med list. Several falls at home, minor without injury. Reviewed importance of BP and BG monitoring and avoiding hypoglcyemia. Wife reports occasional BG 69.   Recommendations/Changes made from today's visit: Hold glipizide if not eating breakfast  Plan: Check blood glucose with any abnormal symptoms - keep a log. Check blood pressure weekly or with any symptoms of dizziness. Follow up in 30 days for log review.  Subjective: [name] is an 87 y.o. year old male who is a primary patient of Copland, Frederico Hamman, MD.  The CCM team was consulted for assistance with disease management and care coordination needs.    Engaged with patient by telephone for follow up visit in response to provider referral for pharmacy case management and/or care coordination services. Denies health concerns. He has been walking dog some and sitting outside. He fell last week, his leg is sore but no injuries. He reports he was outside and fell on incline. [name] (friend) fills his pillbox for him - 1 for AM and 1 for PM.  She also helps with cooking and cleaning.  Consent to Services:  The patient was given information about Chronic Care Management services, agreed to services, and gave verbal consent prior to initiation of services.  Please see initial visit note for detailed documentation.   Patient Care Team: [name], MD as PCP - General (Family Medicine) Sherren Mocha, MD as PCP - Cardiology (Cardiology) [name], [facility] as Pharmacist (Pharmacist) Lavonna Monarch, MD as Consulting Physician (Dermatology)  Recent office visits: 05/20/20 - PCP - Viral syndrome - eating less, sleeping more, upset stomach, discontinue metformin  05/11/20 - PCP - Routine Labs, Start  Gas-X 1-2 capsules as needed. Potassium elevated, hold bananas. He also has H. Pylori bacteria - likely causing stomach issues, and I will send him in treatment tomorrow (Omeprazole BID, clarithromycin, amoxicillin x 14 days )  Recent consult visits: 06/09/20 - Urgent Care - Fall, Xray normal, keep wounds clean and dry  Hospital visits: ED - Fever unspecified cause, dehydration - No medication changes or cause determined   Objective:  Lab Results  Component Value Date   CREATININE 1.87 (H) 05/18/2020   BUN 25 (H) 05/18/2020   GFR 36.04 (L) 05/14/2020   GFRNONAA 34 (L) 05/18/2020   GFRAA 58 (L) 06/25/2018   NA 136 05/18/2020   K 4.3 05/18/2020   CALCIUM 9.1 05/18/2020   CO2 25 05/18/2020   GLUCOSE 172 (H) 05/18/2020   Lab Results  Component Value Date/Time   HGBA1C 7.3 (H) 05/11/2020 10:31 AM   HGBA1C 7.0 (H) 12/20/2019 09:20 AM   GFR 36.04 (L) 05/14/2020 10:15 AM   GFR 41.58 (L) 05/11/2020 10:31 AM   MICROALBUR 7.8 (H) 12/20/2019 09:20 AM   MICROALBUR 3.4 (H) 10/31/2018 10:39 AM    Last diabetic Eye exam:  Lab Results  Component Value Date/Time   HMDIABEYEEXA Retinopathy (A) 04/24/2019 12:00 AM    Last diabetic Foot exam: none per chart   Lab Results  Component Value Date   CHOL 189 12/20/2019   HDL 29.90 (L) 12/20/2019   LDLCALC 52 10/31/2018   LDLDIRECT 130.0 12/20/2019   TRIG 260.0 (H) 12/20/2019   CHOLHDL 6 12/20/2019    Hepatic Function Latest Ref Rng & Units 05/18/2020 05/11/2020 12/20/2019  Total Protein  6.5 - 8.1 g/dL 7.1 6.9 6.6  Albumin 3.5 - 5.0 g/dL 4.2 4.2 4.3  AST 15 - 41 U/L 13(L) 11 10  ALT 0 - 44 U/L _0 Alk Phosphatase 38 - 126 U/L 73 77 75  Total Bilirubin 0.3 - 1.2 mg/dL 0.8 0.4 0.6  Bilirubin, Direct 0.0 - 0.3 mg/dL - 0.1 0.1    Lab Results  Component Value Date/Time   TSH 1.110 04/19/2011 01:29 PM    CBC Latest Ref Rng & Units 05/18/2020 05/14/2020 05/11/2020  WBC 4.0 - 10.5 K/uL 8.7 8.6 6.4  Hemoglobin 13.0 - 17.0 g/dL 14.7 13.4 13.2   Hematocrit 39.0 - 52.0 % 44.6 40.7 39.0  Platelets 150 - 400 K/uL 152 143 157.0    No results found for: VD25OH  Clinical ASCVD: No  The ASCVD Risk score Mikey Bussing DC Jr., et al., 2013) failed to calculate for the following reasons:   The 2013 ASCVD risk score is only valid for ages 77 to 72    Depression screen PHQ 2/9 12/26/2019 09/03/2018 08/30/2017  Decreased Interest 0 0 0  Down, Depressed, Hopeless 1 0 0  PHQ - 2 Score 1 0 0  Altered sleeping - 0 0  Tired, decreased energy - 0 0  Change in appetite - 0 0  Feeling bad or failure about yourself  - 0 0  Trouble concentrating - 0 0  Moving slowly or fidgety/restless - 0 0  Suicidal thoughts - 0 0  PHQ-9 Score - 0 0  Difficult doing work/chores - Not difficult at all Not difficult at all  Some recent data might be hidden    Social History   Tobacco Use  Smoking Status Former Research scientist (life sciences)  . Packs/day: 2.00  . Years: 42.00  . Pack years: 84.00  . Types: Cigarettes  . Start date: 02/25/1953  . Quit date: 04/04/1984  . Years since quitting: 36.4  Smokeless Tobacco Former Systems developer  . Types: Chew  . Quit date: 07/27/1986  Tobacco Comment   quit tobacco 26 years ago   BP Readings from Last 3 Encounters:  06/09/20 (!) 155/82  05/20/20 (!) 156/73  05/18/20 (!) 141/74   Pulse Readings from Last 3 Encounters:  06/09/20 62  05/20/20 85  05/18/20 95   Wt Readings from Last 3 Encounters:  05/18/20 182 lb 15.7 oz (83 kg)  05/11/20 183 lb 8 oz (83.2 kg)  12/26/19 180 lb (81.6 kg)   BMI Readings from Last 3 Encounters:  05/20/20 27.02 kg/m  05/18/20 27.02 kg/m  05/11/20 27.10 kg/m    Assessment/Interventions: Review of patient past medical history, allergies, medications, health status, including review of consultants reports, laboratory and other test data, was performed as part of comprehensive evaluation and provision of chronic care management services.   SDOH:  (Social Determinants of Health) assessments and interventions  performed: Yes SDOH Interventions   Flowsheet Row Most Recent Value  SDOH Interventions   Financial Strain Interventions Intervention Not Indicated     SDOH Screenings   Alcohol Screen: Not on file  Depression (PHQ2-9): Low Risk   . PHQ-2 Score: 1  Financial Resource Strain: Low Risk   . Difficulty of Paying Living Expenses: Not very hard  Food Insecurity: Not on file  Housing: Not on file  Physical Activity: Not on file  Social Connections: Not on file  Stress: Not on file  Tobacco Use: Medium Risk  . Smoking Tobacco Use: Former Smoker  . Smokeless Tobacco Use: Former Systems developer  Transportation Needs: Not on file    CCM Care Plan  Allergies  Allergen Reactions  . Sulfa Antibiotics Nausea Only    Medications Reviewed Today    Reviewed by [name], [facility] (Pharmacist) on 09/03/20 at 57  Med List Status: <None>  Medication Order Taking? Sig Documenting Provider Last Dose Status Informant  aspirin EC 81 MG tablet [id number] No Take 81 mg by mouth daily.  Patient not taking: Reported on 09/03/2020   [provider] Not Taking Consider Medication Status and Discontinue Self  diphenhydrAMINE (BENADRYL) 25 MG tablet [id number] No Take 25 mg by mouth 2 (two) times daily.  Patient not taking: Reported on 09/03/2020   [provider] Not Taking Consider Medication Status and Discontinue Self  donepezil (ARICEPT) 10 MG tablet [id number] Yes TAKE 1 TABLET BY MOUTH EVERYDAY AT BEDTIME Copland, Spencer, MD Taking Active Self  glipiZIDE (GLUCOTROL) 5 MG tablet [id number] Yes TAKE 1 TABLET BY MOUTH EVERY DAY BEFORE BREAKFAST Copland, Spencer, MD Taking Active Self  Multiple Vitamins-Minerals (ICAPS LUTEIN & ZEAXANTHIN PO) [id number] Yes Take 1 capsule by mouth 2 (two) times daily. I - Caps [provider] Taking Active Self  Omega-3 Fatty Acids (FISH OIL) 1000 MG CAPS [id number] Yes Take 1,200 mg by mouth in the morning and at bedtime. [provider] Taking Active  Self  omeprazole (PRILOSEC) 20 MG capsule [id number] No Take 20 mg by mouth 2 (two) times daily before a meal.  Patient not taking: Reported on 09/03/2020   [provider] Not Taking Consider Medication Status and Discontinue   sertraline (ZOLOFT) 100 MG tablet [id number] Yes TAKE 1 TABLET BY MOUTH EVERY DAY Copland, Spencer, MD Taking Active Self          Patient Active Problem List   Diagnosis Date Noted  . Sensorineural hearing loss (SNHL) of both ears 05/15/2017  . Tinnitus, bilateral 05/15/2017  . Macular degeneration of both eyes 01/21/2014  . 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 CAD (coronary artery disease)   . Pericardial tamponade 09/17/2012  . Acute pericarditis, unspecified 09/13/2012  . Adenocarcinoma of left lung, stage 1 ([place]) 07/25/2011  . Cancer of prostate w/med recur risk (T2b-c or Gleason 7 or PSA 10-20) ([place]) 07/25/2011  . Nonmelanoma skin cancer 07/25/2011  . History of colonic polyps 07/25/2011  . Type 2 diabetes mellitus with vascular disease ([place]) 05/21/2007  . HYPERCHOLESTEROLEMIA 05/21/2007  . CARCINOMA, SKIN, SQUAMOUS CELL 01/08/2007  . Generalized anxiety disorder 01/08/2007  . ALZHEIMER'S DISEASE, MILD 01/08/2007  . Essential hypertension 01/08/2007    Immunization History  Administered Date(s) Administered  . Fluad Quad(high Dose 65+) 12/26/2019  . Influenza Whole 01/03/2007  . Influenza, High Dose Seasonal PF 01/19/2018, 03/06/2019  . Influenza,inj,Quad PF,6+ Mos 02/06/2013, 01/20/2014, 01/15/2015, 01/29/2016  . Influenza-Unspecified 01/02/2017  . Moderna Sars-Covid-2 Vaccination 06/03/2019, 07/04/2019, 03/19/2020  . Pneumococcal Conjugate-13 01/20/2014  . Pneumococcal Polysaccharide-23 01/02/2001  . Td 04/04/1994  . Tdap 01/25/2013    Conditions to be addressed/monitored:  Hypertension and Diabetes  Care Plan : [place]  Updates made by [name], [place] since 09/03/2020 12:00 AM    Problem: CHL AMB  "PATIENT-SPECIFIC PROBLEM"     Long-Range Goal: Disease Management   Start Date: 09/03/2020  Priority: High  Note:   Current Barriers:  . Frequent minor falls  . Concern for hypoglycemia   Pharmacist Clinical Goal(s):  Marland Kitchen Patient will contact provider office for questions/concerns as evidenced notation of same in electronic health record through  collaboration with PharmD and provider.   Interventions: . 1:1 collaboration with [name], MD regarding development and update of comprehensive plan of care as evidenced by provider attestation and co-signature . Inter-disciplinary care team collaboration (see longitudinal plan of care) . Comprehensive medication review performed; medication list updated in electronic medical record  Hypertension (BP goal <140/90) -Not ideally controlled - per clinic readings elevated -Current treatment: . None  -Medications previously tried: none reported  -Current home readings: He has not checked BP recently.   -We discussed home monitoring due to last 3 clinic readings elevated. -Denies hypotensive/hypertensive symptoms -Educated on Importance of home blood pressure monitoring; Proper BP monitoring technique; -Counseled to monitor BP at home weekly, document, and provide log at future appointments  Diabetes (A1c goal <8%) -Not ideally controlled - some concern of hypoglycemia  -Current medications: Marland Kitchen Glipizide 5 mg - 1 tablet daily before breakfast -Medications previously tried: metformin   -Current home glucose readings - checks fasting sugar about once per week . fasting glucose: ~150 (per memory, no log) . post prandial glucose: none  -Reports hypoglycemic/hyperglycemic symptoms -Per patient's wife his BG was 69 several days over the past few months. He drinks orange juice and ate a snack and it returned to normal. She reports he eats lots of sweets.-We discussed hypoglycemia risk with glipizide and importance of routine BG  monitoring. -Educated on Prevention and management of hypoglycemic episodes; -Counseled to check feet daily and get yearly eye exams - Denies any problems with his feet. Denies numbness, cuts or pain.  Reports eye examcompleted this past month.  -Recommended hold glipzide if not eating breakfast ; Call if any BG < 70. Check sugar with any abnormal symptoms.   Depression/Anxiety (Goal: Improve mood) -Controlled - per patient report -Current treatment: . Sertraline 100 mg - 1 tablet daily  -Medications previously tried/failed: none reported -Denies alcohol or tobacco use -Recommended to continue current medication  Patient Goals/Self-Care Activities . Patient will:  - check glucose daily, document, and provide at future appointments check blood pressure weekly, document, and provide at future appointments  Follow Up Plan: The care management team will reach out to the patient again over the next 30 days.      Medication Assistance: None required.  Patient affirms current coverage meets needs.  Compliance/Adherence/Medication fill history: Care Gaps: Diabetic eye and foot exam due Shingrix due COVID-19 booster due   Star-Rating Drugs: None   Patient's preferred pharmacy is: [facility], [place] to [address] [place] [address] Phone: [phone] Fax: [phone]  Uses pill box? Yes Pt endorses 100% compliance  Care Plan and Follow Up Patient Decision:  Patient agrees to Care Plan and Follow-up.  [name], PharmD Clinical Pharmacist [facility] at [facility] [phone]

## 2020-09-04 ENCOUNTER — Encounter: Payer: Self-pay | Admitting: Dermatology

## 2020-09-05 NOTE — Progress Notes (Signed)
   Follow-Up Visit   Subjective  [name] is a 87 y.o. male who presents for the following: Procedure (Patient here today for CIS x 3 and SCC x 1).  Multiple biopsy proven non-melanoma skin cancers Location:  Duration:  Quality:  Associated Signs/Symptoms: Modifying Factors:  Severity:  Timing: Context:   Objective  Well appearing patient in no apparent distress; mood and affect are within normal limits. Objective  Left Malar Cheek: Lesion identified by Dr.Edye Hainline and nurse in room.    Objective  Right Zygomatic Area: Lesion identified by Dr.Japleen Tornow and nurse in room.      A focused examination was performed including head and neck. Relevant physical exam findings are noted in the Assessment and Plan.   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  Destruction of lesion Complexity: simple   Destruction method: electrodesiccation and curettage   Informed consent: discussed and consent obtained   Timeout:  patient name, date of birth, surgical site, and procedure verified Anesthesia: the lesion was anesthetized in a standard fashion   Anesthetic:  1% lidocaine w/ epinephrine 1-100,000 local infiltration Curettage performed in three different directions: Yes   Electrodesiccation performed over the curetted area: Yes   Curettage cycles:  3 Lesion length (cm):  2.3 Lesion width (cm):  2.3 Margin per side (cm):  0 Final wound size (cm):  2.3 Hemostasis achieved with:  ferric subsulfate and electrodesiccation Outcome: patient tolerated procedure well with no complications   Post-procedure details: sterile dressing applied and wound care instructions given   Dressing type: petrolatum   Additional details:  Wound innoculated with 5 fluorouracil solution.  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  Destruction of lesion Complexity: simple   Destruction method: electrodesiccation and curettage   Informed consent: discussed  and consent obtained   Timeout:  patient name, date of birth, surgical site, and procedure verified Anesthesia: the lesion was anesthetized in a standard fashion   Anesthetic:  1% lidocaine w/ epinephrine 1-100,000 local infiltration Curettage performed in three different directions: Yes   Curettage cycles:  3 Lesion length (cm):  1.1 Lesion width (cm):  1.1 Margin per side (cm):  0 Final wound size (cm):  1.1 Hemostasis achieved with:  ferric subsulfate Outcome: patient tolerated procedure well with no complications   Post-procedure details: sterile dressing applied and wound care instructions given   Dressing type: bandage and petrolatum   Additional details:  Wound innoculated with 5 fluorouracil solution.      I, Lavonna Monarch, MD, have reviewed all documentation for this visit.  The documentation on 09/05/20 for the exam, diagnosis, procedures, and orders are all accurate and complete.

## 2020-09-24 DIAGNOSIS — E119 Type 2 diabetes mellitus without complications: Secondary | ICD-10-CM | POA: Diagnosis not present

## 2020-10-01 ENCOUNTER — Other Ambulatory Visit: Payer: Self-pay

## 2020-10-01 ENCOUNTER — Encounter: Payer: Self-pay | Admitting: Podiatry

## 2020-10-01 ENCOUNTER — Ambulatory Visit: Payer: Medicare HMO | Admitting: Podiatry

## 2020-10-01 DIAGNOSIS — E1151 Type 2 diabetes mellitus with diabetic peripheral angiopathy without gangrene: Secondary | ICD-10-CM

## 2020-10-01 DIAGNOSIS — B351 Tinea unguium: Secondary | ICD-10-CM

## 2020-10-01 DIAGNOSIS — R69 Illness, unspecified: Secondary | ICD-10-CM | POA: Diagnosis not present

## 2020-10-01 DIAGNOSIS — G309 Alzheimer's disease, unspecified: Secondary | ICD-10-CM

## 2020-10-01 DIAGNOSIS — E1159 Type 2 diabetes mellitus with other circulatory complications: Secondary | ICD-10-CM | POA: Diagnosis not present

## 2020-10-01 DIAGNOSIS — M79675 Pain in left toe(s): Secondary | ICD-10-CM

## 2020-10-01 DIAGNOSIS — H353211 Exudative age-related macular degeneration, right eye, with active choroidal neovascularization: Secondary | ICD-10-CM | POA: Diagnosis not present

## 2020-10-01 DIAGNOSIS — M79674 Pain in right toe(s): Secondary | ICD-10-CM

## 2020-10-01 DIAGNOSIS — F0280 Dementia in other diseases classified elsewhere without behavioral disturbance: Secondary | ICD-10-CM

## 2020-10-01 NOTE — Progress Notes (Signed)
This patient returns to my office for at risk foot care.  This patient requires this care by a professional since this patient will be at risk due to having diabetes with vascular disease.  Patient also has chronic kidney disease.       This patient is unable to cut nails himself since the patient cannot reach his nails.These nails are painful walking and wearing shoes.  This patient presents for at risk foot care today.  General Appearance  Alert, conversant and in no acute stress.  Vascular  Dorsalis pedis  pulses are weakly  palpable  bilaterally. Posterior tibial pulses are absent  B/L.   Capillary return is within normal limits  bilaterally. Cold feet   bilaterally.  Neurologic  Senn-Weinstein monofilament wire test within normal limits  bilaterally. Muscle power within normal limits bilaterally.  Nails Thick disfigured discolored nails with subungual debris  from hallux to fifth toes bilaterally. No evidence of bacterial infection or drainage bilaterally.  Orthopedic  No limitations of motion  feet .  No crepitus or effusions noted.  No bony pathology or digital deformities noted.  Skin  normotropic skin with no porokeratosis noted bilaterally.  No signs of infections or ulcers noted.     Onychomycosis  Pain in right toes  Pain in left toes  Consent was obtained for treatment procedures.   Mechanical debridement of nails 1-5  bilaterally performed with a nail nipper.  Filed with dremel without incident.    Return office visit  prn                 Told patient to return for periodic foot care and evaluation due to potential at risk complications.   Gardiner Barefoot DPM

## 2020-10-08 DIAGNOSIS — H353221 Exudative age-related macular degeneration, left eye, with active choroidal neovascularization: Secondary | ICD-10-CM | POA: Diagnosis not present

## 2020-10-15 ENCOUNTER — Encounter: Payer: Medicare HMO | Admitting: Dermatology

## 2020-10-21 ENCOUNTER — Telehealth: Payer: Self-pay

## 2020-10-21 NOTE — Chronic Care Management (AMB) (Addendum)
Chronic Care Management Pharmacy Assistant   Name: [name]  MRN: [id number] DOB: 08-10-1930  Reason for Encounter: Disease State - HTN/DM  Recent office visits:  None since last CCM contact  Recent consult visits:  10/01/20 Podiatry - No medication changes  Hospital visits:  None in previous 6 months  Medications: Outpatient Encounter Medications as of 10/21/2020  Medication Sig   aspirin EC 81 MG tablet Take 81 mg by mouth daily.   diphenhydrAMINE (BENADRYL) 25 MG tablet Take 25 mg by mouth 2 (two) times daily.   donepezil (ARICEPT) 10 MG tablet TAKE 1 TABLET BY MOUTH EVERYDAY AT BEDTIME   glipiZIDE (GLUCOTROL) 5 MG tablet TAKE 1 TABLET BY MOUTH EVERY DAY BEFORE BREAKFAST   Multiple Vitamins-Minerals (ICAPS LUTEIN & ZEAXANTHIN PO) Take 1 capsule by mouth 2 (two) times daily. I - Caps   Omega-3 Fatty Acids (FISH OIL) 1000 MG CAPS Take 1,200 mg by mouth in the morning and at bedtime.   omeprazole (PRILOSEC) 20 MG capsule Take 20 mg by mouth 2 (two) times daily before a meal.   sertraline (ZOLOFT) 100 MG tablet TAKE 1 TABLET BY MOUTH EVERY DAY   No facility-administered encounter medications on file as of 10/21/2020.     Recent Relevant Labs: Lab Results  Component Value Date/Time   HGBA1C 7.3 (H) 05/11/2020 10:31 AM   HGBA1C 7.0 (H) 12/20/2019 09:20 AM   MICROALBUR 7.8 (H) 12/20/2019 09:20 AM   MICROALBUR 3.4 (H) 10/31/2018 10:39 AM    Kidney Function Lab Results  Component Value Date/Time   CREATININE 1.87 (H) 05/18/2020 12:58 PM   CREATININE 1.69 (H) 05/14/2020 10:15 AM   CREATININE 1.5 (H) 07/30/2015 09:57 AM   CREATININE 1.2 07/16/2013 10:59 AM   GFR 36.04 (L) 05/14/2020 10:15 AM   GFRNONAA 34 (L) 05/18/2020 12:58 PM   GFRAA 58 (L) 06/25/2018 09:49 PM     Contacted patient on 10/26/2020 to discuss diabetes disease state. I spoke with the wife   Current antihyperglycemic regimen:  Glipizide 5 mg - 1 tablet daily before breakfast    Patient verbally  confirms he is taking the above medications as directed. Yes The wife states correct   What diet changes have been made to improve diabetes control?none identified  What recent interventions/DTPs have been made to improve glycemic control:  None identified at this time  Have there been any recent hospitalizations or ED visits since last visit with CPP? No  Patient denies hypoglycemic symptoms, including Pale, Sweaty, Shaky, Hungry, Nervous/irritable, and Vision changes  Patient denies hyperglycemic symptoms, including blurry vision, excessive thirst, fatigue, polyuria, and weakness  During the week, how often does your blood glucose drop below 70? This has not occurred and the wife states he has not had any falls recently and no episodes of highs or lows with the blood sugars Are you checking your feet daily/regularly? Yes   The wife states they have been out of town on vacation for a few weeks and did not carry along any monitors for readings and she will start now that they are home  Adherence Review: Is the patient currently on a STATIN medication? No Is the patient currently on ACE/ARB medication? No Does the patient have >5 day gap between last estimated fill dates? No   Star Rating Drugs:  Medication:  Last Fill: Day Supply Glipizide 5mg   10/09/20  90  Upcoming appts,  11/2020  Dermatology, 01/2021  podiatry  [name], CPP notified  [facility]  [facility] Clincal Pharmacy Assistant [phone]  I have reviewed the care management and care coordination activities outlined in this encounter and I am certifying that I agree with the content of this note. No further action required.  [name], PharmD Clinical Pharmacist [facility] [phone]

## 2020-10-26 DIAGNOSIS — Z833 Family history of diabetes mellitus: Secondary | ICD-10-CM | POA: Diagnosis not present

## 2020-10-26 DIAGNOSIS — R030 Elevated blood-pressure reading, without diagnosis of hypertension: Secondary | ICD-10-CM | POA: Diagnosis not present

## 2020-10-26 DIAGNOSIS — F411 Generalized anxiety disorder: Secondary | ICD-10-CM | POA: Diagnosis not present

## 2020-10-26 DIAGNOSIS — Z85118 Personal history of other malignant neoplasm of bronchus and lung: Secondary | ICD-10-CM | POA: Diagnosis not present

## 2020-10-26 DIAGNOSIS — N393 Stress incontinence (female) (male): Secondary | ICD-10-CM | POA: Diagnosis not present

## 2020-10-26 DIAGNOSIS — N529 Male erectile dysfunction, unspecified: Secondary | ICD-10-CM | POA: Diagnosis not present

## 2020-10-26 DIAGNOSIS — G309 Alzheimer's disease, unspecified: Secondary | ICD-10-CM | POA: Diagnosis not present

## 2020-10-26 DIAGNOSIS — E119 Type 2 diabetes mellitus without complications: Secondary | ICD-10-CM | POA: Diagnosis not present

## 2020-10-26 DIAGNOSIS — Z7984 Long term (current) use of oral hypoglycemic drugs: Secondary | ICD-10-CM | POA: Diagnosis not present

## 2020-10-26 DIAGNOSIS — R69 Illness, unspecified: Secondary | ICD-10-CM | POA: Diagnosis not present

## 2020-11-05 DIAGNOSIS — H353221 Exudative age-related macular degeneration, left eye, with active choroidal neovascularization: Secondary | ICD-10-CM | POA: Diagnosis not present

## 2020-11-26 DIAGNOSIS — H353211 Exudative age-related macular degeneration, right eye, with active choroidal neovascularization: Secondary | ICD-10-CM | POA: Diagnosis not present

## 2020-12-10 ENCOUNTER — Ambulatory Visit (INDEPENDENT_AMBULATORY_CARE_PROVIDER_SITE_OTHER): Payer: Medicare HMO | Admitting: Dermatology

## 2020-12-10 ENCOUNTER — Other Ambulatory Visit: Payer: Self-pay

## 2020-12-10 DIAGNOSIS — C44329 Squamous cell carcinoma of skin of other parts of face: Secondary | ICD-10-CM | POA: Diagnosis not present

## 2020-12-10 DIAGNOSIS — D485 Neoplasm of uncertain behavior of skin: Secondary | ICD-10-CM

## 2020-12-10 NOTE — Patient Instructions (Signed)

## 2020-12-16 DIAGNOSIS — H43813 Vitreous degeneration, bilateral: Secondary | ICD-10-CM | POA: Diagnosis not present

## 2020-12-16 DIAGNOSIS — H31013 Macula scars of posterior pole (postinflammatory) (post-traumatic), bilateral: Secondary | ICD-10-CM | POA: Diagnosis not present

## 2020-12-16 DIAGNOSIS — H353223 Exudative age-related macular degeneration, left eye, with inactive scar: Secondary | ICD-10-CM | POA: Diagnosis not present

## 2020-12-16 DIAGNOSIS — H353211 Exudative age-related macular degeneration, right eye, with active choroidal neovascularization: Secondary | ICD-10-CM | POA: Diagnosis not present

## 2020-12-18 ENCOUNTER — Encounter: Payer: Self-pay | Admitting: Dermatology

## 2020-12-18 NOTE — Progress Notes (Signed)
   Follow-Up Visit   Subjective  [name] is a 87 y.o. male who presents for the following: Procedure (Here for treatment SCC x 4 ).  Multiple biopsy-proven nonmelanoma facial skin cancers, but crust outside right eye is the one that has grown and bothers [name]. Location:  Duration:  Quality:  Associated Signs/Symptoms: Modifying Factors:  Severity:  Timing: Context:   Objective  Well appearing patient in no apparent distress; mood and affect are within normal limits. Right Malar Cheek Waxy 2+ centimeter crust, SCCA       A focused examination was performed including head and neck. Relevant physical exam findings are noted in the Assessment and Plan.   Assessment & Plan    Squamous cell carcinoma of skin of other parts of face Right Malar Cheek  Skin / nail biopsy Type of biopsy: tangential   Informed consent: discussed and consent obtained   Timeout: patient name, date of birth, surgical site, and procedure verified   Procedure prep:  Patient was prepped and draped in usual sterile fashion (Non sterile) Prep type:  Chlorhexidine Anesthesia: the lesion was anesthetized in a standard fashion   Anesthetic:  1% lidocaine w/ epinephrine 1-100,000 local infiltration Instrument used: flexible razor blade   Outcome: patient tolerated procedure well   Post-procedure details: wound care instructions given    Destruction of lesion Complexity: simple   Destruction method: electrodesiccation and curettage   Informed consent: discussed and consent obtained   Timeout:  patient name, date of birth, surgical site, and procedure verified Anesthesia: the lesion was anesthetized in a standard fashion   Anesthetic:  1% lidocaine w/ epinephrine 1-100,000 local infiltration Curettage performed in three different directions: Yes   Electrodesiccation performed over the curetted area: Yes   Curettage cycles:  3 Lesion length (cm):  3 Lesion width (cm):  3 Margin per  side (cm):  0 Final wound size (cm):  3 Hemostasis achieved with:  ferric subsulfate Outcome: patient tolerated procedure well with no complications   Additional details:  Wound innoculated with 5 fluorouracil solution.  Specimen 1 - Surgical pathology Differential Diagnosis: bcc v scc- txpbx  Check Margins: No  After shave biopsy the base of the lesion was treated with curettage plus cautery followed by inoculation of parenteral 5-FU.      I, Lavonna Monarch, MD, have reviewed all documentation for this visit.  The documentation on 12/18/20 for the exam, diagnosis, procedures, and orders are all accurate and complete.

## 2020-12-25 DIAGNOSIS — E119 Type 2 diabetes mellitus without complications: Secondary | ICD-10-CM | POA: Diagnosis not present

## 2021-01-01 ENCOUNTER — Telehealth (INDEPENDENT_AMBULATORY_CARE_PROVIDER_SITE_OTHER): Payer: Medicare HMO | Admitting: Nurse Practitioner

## 2021-01-01 ENCOUNTER — Other Ambulatory Visit: Payer: Self-pay

## 2021-01-01 DIAGNOSIS — U071 COVID-19: Secondary | ICD-10-CM | POA: Insufficient documentation

## 2021-01-01 DIAGNOSIS — R059 Cough, unspecified: Secondary | ICD-10-CM | POA: Diagnosis not present

## 2021-01-01 MED ORDER — MOLNUPIRAVIR EUA 200MG CAPSULE: 4.0000 | ORAL_CAPSULE | Freq: Two times a day (BID) | ORAL | 0 refills | Status: AC

## 2021-01-01 MED ORDER — BENZONATATE 200 MG PO CAPS: 200.0000 mg | ORAL_CAPSULE | Freq: Two times a day (BID) | ORAL | 0 refills | Status: AC | PRN

## 2021-01-01 NOTE — Progress Notes (Signed)
Patient ID: [name], male    DOB: 1930/09/02, 87 y.o.   MRN: [id number]  Virtual visit completed through [place], a video enabled telemedicine application. Due to national recommendations of social distancing due to COVID-19, a virtual visit is felt to be most appropriate for this patient at this time. Reviewed limitations, risks, security and privacy concerns of performing a virtual visit and the availability of in person appointments. I also reviewed that there may be a patient responsible charge related to this service. The patient agreed to proceed.   Patient location: home Provider location: [place] at [facility], office Persons participating in this virtual visit: patient, provider, wife   If any vitals were documented, they were collected by patient at home unless specified below.    There were no vitals taken for this visit.   CC: Covid 19 infection Subjective:   HPI: [name] is a 87 y.o. male presenting on 01/01/2021 for Covid Positive (On 12/31/20, sx started on 9/26 or 12/29/20. Headache, sore throat, cough-dry/deep cough, chills. )  Wife was the history giver as patient did not feel well and his throat was so sore.   Tested positive 12/30/2020 Symptoms started on 12/29/2020 Vaccinated moderna x 2 and a booster OTC Advil cold and sinus and Robtussien cough syrup. Without much relief     Relevant past medical, surgical, family and social history reviewed and updated as indicated. Interim medical history since our last visit reviewed. Allergies and medications reviewed and updated. Outpatient Medications Prior to Visit  Medication Sig Dispense Refill   aspirin EC 81 MG tablet Take 81 mg by mouth daily.     diphenhydrAMINE (BENADRYL) 25 MG tablet Take 25 mg by mouth 2 (two) times daily.     donepezil (ARICEPT) 10 MG tablet TAKE 1 TABLET BY MOUTH EVERYDAY AT BEDTIME 90 tablet 3   glipiZIDE (GLUCOTROL) 5 MG tablet TAKE 1 TABLET BY MOUTH EVERY DAY BEFORE  BREAKFAST 90 tablet 3   Multiple Vitamins-Minerals (ICAPS LUTEIN & ZEAXANTHIN PO) Take 1 capsule by mouth 2 (two) times daily. I - Caps     Omega-3 Fatty Acids (FISH OIL) 1000 MG CAPS Take 1,200 mg by mouth in the morning and at bedtime.     omeprazole (PRILOSEC) 20 MG capsule Take 20 mg by mouth 2 (two) times daily before a meal.     sertraline (ZOLOFT) 100 MG tablet TAKE 1 TABLET BY MOUTH EVERY DAY 90 tablet 3   tobramycin (TOBREX) 0.3 % ophthalmic solution PLEASE SEE ATTACHED FOR DETAILED DIRECTIONS     No facility-administered medications prior to visit.     Per HPI unless specifically indicated in ROS section below Review of Systems  Constitutional:  Positive for chills and fatigue. Negative for fever.  HENT:  Positive for rhinorrhea, sneezing, sore throat and voice change.   Respiratory:  Positive for cough. Negative for shortness of breath.   Cardiovascular:  Negative for chest pain.  Gastrointestinal:  Negative for diarrhea, nausea and vomiting.  Neurological:  Positive for headaches.  Objective:  There were no vitals taken for this visit.  Wt Readings from Last 3 Encounters:  05/18/20 182 lb 15.7 oz (83 kg)  05/11/20 183 lb 8 oz (83.2 kg)  12/26/19 180 lb (81.6 kg)       Physical exam: Gen: alert, NAD, not ill appearing Pulm: speaks in complete sentences without increased work of breathing Psych: normal mood, normal thought content      Results for orders  placed or performed during the hospital encounter of 05/18/20  Resp Panel by RT-PCR (Flu A&B, Covid)   Specimen: Nasopharyngeal(NP) swabs in vial transport medium  Result Value Ref Range   SARS Coronavirus 2 by RT PCR NEGATIVE NEGATIVE   Influenza A by PCR NEGATIVE NEGATIVE   Influenza B by PCR NEGATIVE NEGATIVE  Urine Culture   Specimen: Urine, Random  Result Value Ref Range   Specimen Description      URINE, RANDOM Performed at [facility], [address] [place], [place] [address]    Special  Requests      NONE Performed at [facility], [address] [place], [place] [address]    Culture      NO GROWTH Performed at [facility], [place] [address] [place], [place] [address]    Report Status 05/20/2020 FINAL   Blood culture (routine x 2)   Specimen: BLOOD  Result Value Ref Range   Specimen Description BLOOD BLOOD RIGHT WRIST    Special Requests      BOTTLES DRAWN AEROBIC AND ANAEROBIC Blood Culture results may not be optimal due to an inadequate volume of blood received in culture bottles   Culture      NO GROWTH 5 DAYS Performed at [facility], [address] [place], [place] [address]    Report Status 05/23/2020 FINAL   Lactic acid, plasma  Result Value Ref Range   Lactic Acid, Venous 2.0 (HH) 0.5 - 1.9 mmol/L  Lactic acid, plasma  Result Value Ref Range   Lactic Acid, Venous 1.4 0.5 - 1.9 mmol/L  Comprehensive metabolic panel  Result Value Ref Range   Sodium 136 135 - 145 mmol/L   Potassium 4.3 3.5 - 5.1 mmol/L   Chloride 100 98 - 111 mmol/L   CO2 25 22 - 32 mmol/L   Glucose, Bld 172 (H) 70 - 99 mg/dL   BUN 25 (H) 8 - 23 mg/dL   Creatinine, Ser 1.87 (H) 0.61 - 1.24 mg/dL   Calcium 9.1 8.9 - 10.3 mg/dL   Total Protein 7.1 6.5 - 8.1 g/dL   Albumin 4.2 3.5 - 5.0 g/dL   AST 13 (L) 15 - 41 U/L   ALT 11 0 - 44 U/L   Alkaline Phosphatase 73 38 - 126 U/L   Total Bilirubin 0.8 0.3 - 1.2 mg/dL   GFR, Estimated 34 (L) >60 mL/min   Anion gap 11 5 - 15  CBC with Differential  Result Value Ref Range   WBC 8.7 4.0 - 10.5 K/uL   RBC 4.54 4.22 - 5.81 MIL/uL   Hemoglobin 14.7 13.0 - 17.0 g/dL   HCT 44.6 39.0 - 52.0 %   MCV 98.2 80.0 - 100.0 fL   MCH 32.4 26.0 - 34.0 pg   MCHC 33.0 30.0 - 36.0 g/dL   RDW 12.0 11.5 - 15.5 %   Platelets 152 150 - 400 K/uL   nRBC 0.0 0.0 - 0.2 %   Neutrophils Relative % 68 %   Neutro Abs 5.8 1.7 - 7.7 K/uL   Lymphocytes Relative 8 %   Lymphs Abs 0.7 0.7 - 4.0 K/uL   Monocytes Relative 22 %   Monocytes  Absolute 1.9 (H) 0.1 - 1.0 K/uL   Eosinophils Relative 1 %   Eosinophils Absolute 0.1 0.0 - 0.5 K/uL   Basophils Relative 0 %   Basophils Absolute 0.0 0.0 - 0.1 K/uL   Immature Granulocytes 1 %   Abs Immature Granulocytes  0.12 (H) 0.00 - 0.07 K/uL  Urinalysis, Complete w Microscopic  Result Value Ref Range   Color, Urine AMBER (A) YELLOW   APPearance HAZY (A) CLEAR   Specific Gravity, Urine 1.026 1.005 - 1.030   pH 5.0 5.0 - 8.0   Glucose, UA NEGATIVE NEGATIVE mg/dL   Hgb urine dipstick NEGATIVE NEGATIVE   Bilirubin Urine NEGATIVE NEGATIVE   Ketones, ur NEGATIVE NEGATIVE mg/dL   Protein, ur NEGATIVE NEGATIVE mg/dL   Nitrite NEGATIVE NEGATIVE   Leukocytes,Ua NEGATIVE NEGATIVE   RBC / HPF 0-5 0 - 5 RBC/hpf   WBC, UA 0-5 0 - 5 WBC/hpf   Bacteria, UA RARE (A) NONE SEEN   Squamous Epithelial / LPF NONE SEEN 0 - 5   Mucus PRESENT    Hyaline Casts, UA PRESENT   Lipase, blood  Result Value Ref Range   Lipase 61 (H) 11 - 51 U/L   Assessment & Plan:   Problem List Items Addressed This Visit       Other   Cough    Cough seems to be worse in the evening.  Using Robitussin without great relief.  We will send in some Tessalon Perles to aid in cough suppression.  Start Tessalon Perles 200 mg twice daily as needed for 10 days.      Relevant Medications   benzonatate (TESSALON) 200 MG capsule   COVID-19 - Primary    Patient family recently traveled to the beach on return was a started feeling poorly.  They did at home COVID test that tested positive.  Patient is high risk for severe disease so we discussed treatment options given medications and kidney function elected to use molnupiravir.  Discussed this with the patient's spouse as he gave her permission with the visit.  Did discuss low threshold change in symptoms or worsening condition to be evaluated in urgent or emergency care setting.  Wife acknowledged. Start molnupiravir.  Monitor oxygen levels at home.  Discussed if 90 or below  consistently he needs to be evaluated in person.  Ideally would like oxygen level 94% or higher.  States that have an in-home caregiver she will relay these instructions      Relevant Medications   molnupiravir EUA (LAGEVRIO) 200 mg CAPS capsule     No orders of the defined types were placed in this encounter.  No orders of the defined types were placed in this encounter.   I discussed the assessment and treatment plan with the patient. The patient was provided an opportunity to ask questions and all were answered. The patient agreed with the plan and demonstrated an understanding of the instructions. The patient was advised to call back or seek an in-person evaluation if the symptoms worsen or if the condition fails to improve as anticipated.  Follow up plan: No follow-ups on file.  Romilda Garret, NP

## 2021-01-01 NOTE — Assessment & Plan Note (Signed)
Patient family recently traveled to the beach on return was a started feeling poorly.  They did at home COVID test that tested positive.  Patient is high risk for severe disease so we discussed treatment options given medications and kidney function elected to use molnupiravir.  Discussed this with the patient's spouse as he gave her permission with the visit.  Did discuss low threshold change in symptoms or worsening condition to be evaluated in urgent or emergency care setting.  Wife acknowledged. Start molnupiravir.  Monitor oxygen levels at home.  Discussed if 90 or below consistently he needs to be evaluated in person.  Ideally would like oxygen level 94% or higher.  States that have an in-home caregiver she will relay these instructions

## 2021-01-01 NOTE — Assessment & Plan Note (Signed)
Cough seems to be worse in the evening.  Using Robitussin without great relief.  We will send in some Tessalon Perles to aid in cough suppression.  Start Tessalon Perles 200 mg twice daily as needed for 10 days.

## 2021-01-05 ENCOUNTER — Other Ambulatory Visit: Payer: Self-pay | Admitting: Family Medicine

## 2021-01-05 ENCOUNTER — Telehealth: Payer: Self-pay

## 2021-01-05 NOTE — Chronic Care Management (AMB) (Addendum)
Chronic Care Management Pharmacy Assistant   Name: [name]  MRN: [id number] DOB: [birth year]   Reason for Encounter: Diabetes  Disease State   Recent office visits:  01/01/21-Family Medicine-Telemedicine visit.Patient presented for headache,cough,sore throat,chills.12/31/20-positive covid test.Start Tessalon Perles 200 mg twice daily as needed for 10 days start molnupiravir EUA (LAGEVRIO) 200 mg CAPS capsule  Recent consult visits:  12/10/20-Dermatology-Patient presented for follow up nonmelanoma facial skin cancers.  Hospital visits:  None in previous 6 months  Medications: Outpatient Encounter Medications as of 01/05/2021  Medication Sig   aspirin EC 81 MG tablet Take 81 mg by mouth daily.   benzonatate (TESSALON) 200 MG capsule Take 1 capsule (200 mg total) by mouth 2 (two) times daily as needed for up to 10 days for cough.   diphenhydrAMINE (BENADRYL) 25 MG tablet Take 25 mg by mouth 2 (two) times daily.   donepezil (ARICEPT) 10 MG tablet TAKE 1 TABLET BY MOUTH EVERYDAY AT BEDTIME   glipiZIDE (GLUCOTROL) 5 MG tablet TAKE 1 TABLET BY MOUTH EVERY DAY BEFORE BREAKFAST   molnupiravir EUA (LAGEVRIO) 200 mg CAPS capsule Take 4 capsules (800 mg total) by mouth 2 (two) times daily for 5 days.   Multiple Vitamins-Minerals (ICAPS LUTEIN & ZEAXANTHIN PO) Take 1 capsule by mouth 2 (two) times daily. I - Caps   Omega-3 Fatty Acids (FISH OIL) 1000 MG CAPS Take 1,200 mg by mouth in the morning and at bedtime.   omeprazole (PRILOSEC) 20 MG capsule Take 20 mg by mouth 2 (two) times daily before a meal.   sertraline (ZOLOFT) 100 MG tablet TAKE 1 TABLET BY MOUTH EVERY DAY   tobramycin (TOBREX) 0.3 % ophthalmic solution PLEASE SEE ATTACHED FOR DETAILED DIRECTIONS   No facility-administered encounter medications on file as of 01/05/2021.      Recent Relevant Labs: Lab Results  Component Value Date/Time   HGBA1C 7.3 (H) 05/11/2020 10:31 AM   HGBA1C 7.0 (H) 12/20/2019 09:20 AM    MICROALBUR 7.8 (H) 12/20/2019 09:20 AM   MICROALBUR 3.4 (H) 10/31/2018 10:39 AM    Kidney Function Lab Results  Component Value Date/Time   CREATININE 1.87 (H) 05/18/2020 12:58 PM   CREATININE 1.69 (H) 05/14/2020 10:15 AM   CREATININE 1.5 (H) 07/30/2015 09:57 AM   CREATININE 1.2 07/16/2013 10:59 AM   GFR 36.04 (L) 05/14/2020 10:15 AM   GFRNONAA 34 (L) 05/18/2020 12:58 PM   GFRAA 58 (L) 06/25/2018 09:49 PM     Contacted patient on 01/06/21 to discuss diabetes disease state.   Current antihyperglycemic regimen:  Glipizide 5 mg - 1 tablet daily before breakfast    Patient verbally confirms he is taking the above medications as directed. Yes  What diet changes have been made to improve diabetes control? The wife reports she is monitoring sweets, preparing lean meals.  What recent interventions/DTPs have been made to improve glycemic control: none identified  Have there been any recent hospitalizations or ED visits since last visit with CPP? No  Patient denies hypoglycemic symptoms, including Pale, Sweaty, Shaky, Hungry, Nervous/irritable, and Vision changes  Patient denies hyperglycemic symptoms, including blurry vision, excessive thirst, fatigue, polyuria, and weakness  How often are you checking your blood sugar?  The patient reports he has not checked BG since he was positive for covid.he is about to finish up the medications called in for the Covid-19 and he states he is feeling much better and will try to obtain BG readings for the future.  Are you checking your  feet daily/regularly? Yes  Adherence Review: Is the patient currently on a STATIN medication? No Is the patient currently on ACE/ARB medication? No Does the patient have >5 day gap between last estimated fill dates? No  Care Gaps: Annual wellness visit in last year?  12/26/19 Most Recent BP reading:  140/66  63-P 05/11/20  Last eye exam / retinopathy screening:  04/23/20 Last diabetic foot exam: 07/02/20  Counseled  patient on importance of annual eye and foot exam.   Star Rating Drugs:  Medication:  Last Fill: Day Supply Glipizide 5mg   10/09/20  90  No appointments scheduled within the next 30 days.  [name], CPP notified  Avel Sensor, [place] Assistant [phone]  I have reviewed the care management and care coordination activities outlined in this encounter and I am certifying that I agree with the content of this note. No further action required.  [name], PharmD Clinical Pharmacist [facility] [phone]

## 2021-01-06 NOTE — Telephone Encounter (Signed)
Refill request Last office visit video acute 01/01/21 Glipizide last refill 01/15/20 #90/3 See allergy/ contraindication Zoloft  last refill 01/21/20 #90/3 Please advise when patient needs a follow-up with PCP

## 2021-01-07 ENCOUNTER — Ambulatory Visit: Payer: Medicare HMO | Admitting: Podiatry

## 2021-01-22 DIAGNOSIS — H353223 Exudative age-related macular degeneration, left eye, with inactive scar: Secondary | ICD-10-CM | POA: Diagnosis not present

## 2021-01-22 DIAGNOSIS — H353211 Exudative age-related macular degeneration, right eye, with active choroidal neovascularization: Secondary | ICD-10-CM | POA: Diagnosis not present

## 2021-02-11 ENCOUNTER — Other Ambulatory Visit: Payer: Self-pay

## 2021-02-11 ENCOUNTER — Ambulatory Visit (INDEPENDENT_AMBULATORY_CARE_PROVIDER_SITE_OTHER): Payer: Medicare HMO | Admitting: Dermatology

## 2021-02-11 ENCOUNTER — Encounter: Payer: Self-pay | Admitting: Dermatology

## 2021-02-11 DIAGNOSIS — D0439 Carcinoma in situ of skin of other parts of face: Secondary | ICD-10-CM

## 2021-02-11 DIAGNOSIS — D049 Carcinoma in situ of skin, unspecified: Secondary | ICD-10-CM

## 2021-02-11 DIAGNOSIS — D485 Neoplasm of uncertain behavior of skin: Secondary | ICD-10-CM

## 2021-02-11 NOTE — Patient Instructions (Signed)

## 2021-02-13 ENCOUNTER — Telehealth: Payer: Self-pay | Admitting: Family Medicine

## 2021-02-14 NOTE — Telephone Encounter (Signed)
Please schedule Medicare Wellness with nurse and CPE with [name].

## 2021-02-15 NOTE — Telephone Encounter (Signed)
Called patient and got him scheduled for 12/12 at 11:15 for phone call and 12/20 at 10:20 for his CPE

## 2021-03-06 ENCOUNTER — Encounter: Payer: Self-pay | Admitting: Dermatology

## 2021-03-06 NOTE — Progress Notes (Signed)
Follow-Up Visit   Subjective  [name] is a 87 y.o. male who presents for the following: Procedure (Here to treat CIS x 2).  Biopsy-proven facial skin cancer +2 other spots that are growing Location:  Duration:  Quality:  Associated Signs/Symptoms: Modifying Factors:  Severity:  Timing: Context:   Objective  Well appearing patient in no apparent distress; mood and affect are within normal limits. Left Parotid Area Lesion identified by nurse and Dr.Laydon Martis in room.   Left Forehead, Right Eyebrow Waxy pink crust compatible with carcinoma in situ, 1 cm left forehead 1.3 cm right eyebrow             A focused examination was performed including the neck. Relevant physical exam findings are noted in the Assessment and Plan.   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  Destruction of lesion Complexity: simple   Destruction method: electrodesiccation and curettage   Informed consent: discussed and consent obtained   Timeout:  patient name, date of birth, surgical site, and procedure verified Anesthesia: the lesion was anesthetized in a standard fashion   Anesthetic:  1% lidocaine w/ epinephrine 1-100,000 local infiltration Curettage performed in three different directions: Yes   Curettage cycles:  3 Lesion length (cm):  2.2 Lesion width (cm):  2.2 Margin per side (cm):  0 Final wound size (cm):  2.2 Hemostasis achieved with:  ferric subsulfate Outcome: patient tolerated procedure well with no complications   Post-procedure details: wound care instructions given   Additional details:  Wound innoculated with 5 fluorouracil solution.  Carcinoma in situ of skin of other part of face (2) Right Eyebrow; Left Forehead  After shave biopsy the base of each lesion was treated by curettage, cautery, and infiltration with parenteral 5-fluorouracil.  Skin / nail biopsy - Left Forehead Type of biopsy: tangential    Informed consent: discussed and consent obtained   Timeout: patient name, date of birth, surgical site, and procedure verified   Anesthesia: the lesion was anesthetized in a standard fashion   Anesthetic:  1% lidocaine w/ epinephrine 1-100,000 local infiltration Instrument used: flexible razor blade   Hemostasis achieved with: ferric subsulfate   Outcome: patient tolerated procedure well   Post-procedure details: wound care instructions given    Skin / nail biopsy - Right Eyebrow Type of biopsy: tangential   Informed consent: discussed and consent obtained   Timeout: patient name, date of birth, surgical site, and procedure verified   Anesthesia: the lesion was anesthetized in a standard fashion   Anesthetic:  1% lidocaine w/ epinephrine 1-100,000 local infiltration Instrument used: flexible razor blade   Hemostasis achieved with: ferric subsulfate   Outcome: patient tolerated procedure well   Post-procedure details: wound care instructions given    Destruction of lesion - Left Forehead Complexity: simple   Destruction method: electrodesiccation and curettage   Informed consent: discussed and consent obtained   Timeout:  patient name, date of birth, surgical site, and procedure verified Anesthesia: the lesion was anesthetized in a standard fashion   Anesthetic:  1% lidocaine w/ epinephrine 1-100,000 local infiltration Curettage performed in three different directions: Yes   Curettage cycles:  3 Lesion length (cm):  1.5 Lesion width (cm):  1.5 Margin per side (cm):  0 Final wound size (cm):  1.5 Hemostasis achieved with:  aluminum chloride Outcome: patient tolerated procedure well with no complications   Post-procedure details: wound care instructions given    Destruction  of lesion - Right Eyebrow Complexity: simple   Destruction method: electrodesiccation and curettage   Informed consent: discussed and consent obtained   Timeout:  patient name, date of birth, surgical site,  and procedure verified Anesthesia: the lesion was anesthetized in a standard fashion   Anesthetic:  1% lidocaine w/ epinephrine 1-100,000 local infiltration Curettage performed in three different directions: Yes   Curettage cycles:  3 Lesion length (cm):  2 Lesion width (cm):  2 Margin per side (cm):  0 Final wound size (cm):  2 Hemostasis achieved with:  aluminum chloride Outcome: patient tolerated procedure well with no complications   Post-procedure details: wound care instructions given    Specimen 1 - Surgical pathology Differential Diagnosis: scc vs bcc  Check Margins: No  Specimen 2 - Surgical pathology Differential Diagnosis: scc vs bcc  Check Margins: No      I, Lavonna Monarch, MD, have reviewed all documentation for this visit.  The documentation on 03/06/21 for the exam, diagnosis, procedures, and orders are all accurate and complete.

## 2021-03-07 ENCOUNTER — Emergency Department (HOSPITAL_COMMUNITY): Payer: Medicare HMO

## 2021-03-07 ENCOUNTER — Other Ambulatory Visit: Payer: Self-pay

## 2021-03-07 ENCOUNTER — Inpatient Hospital Stay (HOSPITAL_COMMUNITY)
Admission: EM | Admit: 2021-03-07 | Discharge: 2021-03-12 | DRG: 065 | Disposition: A | Discharge status: Rehabilitation Facility | Payer: Medicare HMO | Attending: Internal Medicine | Admitting: Internal Medicine

## 2021-03-07 ENCOUNTER — Encounter (HOSPITAL_COMMUNITY): Payer: Self-pay

## 2021-03-07 DIAGNOSIS — K5900 Constipation, unspecified: Secondary | ICD-10-CM | POA: Diagnosis present

## 2021-03-07 DIAGNOSIS — F0284 Dementia in other diseases classified elsewhere, unspecified severity, with anxiety: Secondary | ICD-10-CM | POA: Diagnosis present

## 2021-03-07 DIAGNOSIS — E785 Hyperlipidemia, unspecified: Secondary | ICD-10-CM | POA: Diagnosis present

## 2021-03-07 DIAGNOSIS — Z85818 Personal history of malignant neoplasm of other sites of lip, oral cavity, and pharynx: Secondary | ICD-10-CM

## 2021-03-07 DIAGNOSIS — R739 Hyperglycemia, unspecified: Secondary | ICD-10-CM | POA: Diagnosis not present

## 2021-03-07 DIAGNOSIS — E875 Hyperkalemia: Secondary | ICD-10-CM | POA: Diagnosis present

## 2021-03-07 DIAGNOSIS — F411 Generalized anxiety disorder: Secondary | ICD-10-CM | POA: Diagnosis present

## 2021-03-07 DIAGNOSIS — I6381 Other cerebral infarction due to occlusion or stenosis of small artery: Secondary | ICD-10-CM | POA: Diagnosis not present

## 2021-03-07 DIAGNOSIS — Z20822 Contact with and (suspected) exposure to covid-19: Secondary | ICD-10-CM | POA: Diagnosis present

## 2021-03-07 DIAGNOSIS — R29704 NIHSS score 4: Secondary | ICD-10-CM | POA: Diagnosis present

## 2021-03-07 DIAGNOSIS — Z833 Family history of diabetes mellitus: Secondary | ICD-10-CM

## 2021-03-07 DIAGNOSIS — R4781 Slurred speech: Secondary | ICD-10-CM | POA: Diagnosis present

## 2021-03-07 DIAGNOSIS — Z902 Acquired absence of lung [part of]: Secondary | ICD-10-CM

## 2021-03-07 DIAGNOSIS — Z7984 Long term (current) use of oral hypoglycemic drugs: Secondary | ICD-10-CM

## 2021-03-07 DIAGNOSIS — G4489 Other headache syndrome: Secondary | ICD-10-CM | POA: Diagnosis not present

## 2021-03-07 DIAGNOSIS — I639 Cerebral infarction, unspecified: Secondary | ICD-10-CM | POA: Diagnosis not present

## 2021-03-07 DIAGNOSIS — H538 Other visual disturbances: Secondary | ICD-10-CM | POA: Diagnosis present

## 2021-03-07 DIAGNOSIS — Z882 Allergy status to sulfonamides status: Secondary | ICD-10-CM

## 2021-03-07 DIAGNOSIS — D509 Iron deficiency anemia, unspecified: Secondary | ICD-10-CM | POA: Diagnosis present

## 2021-03-07 DIAGNOSIS — Z9079 Acquired absence of other genital organ(s): Secondary | ICD-10-CM

## 2021-03-07 DIAGNOSIS — R0602 Shortness of breath: Secondary | ICD-10-CM | POA: Diagnosis not present

## 2021-03-07 DIAGNOSIS — G8194 Hemiplegia, unspecified affecting left nondominant side: Secondary | ICD-10-CM | POA: Diagnosis present

## 2021-03-07 DIAGNOSIS — R6889 Other general symptoms and signs: Secondary | ICD-10-CM | POA: Diagnosis not present

## 2021-03-07 DIAGNOSIS — I2510 Atherosclerotic heart disease of native coronary artery without angina pectoris: Secondary | ICD-10-CM | POA: Diagnosis present

## 2021-03-07 DIAGNOSIS — R4182 Altered mental status, unspecified: Secondary | ICD-10-CM | POA: Diagnosis not present

## 2021-03-07 DIAGNOSIS — H3530 Unspecified macular degeneration: Secondary | ICD-10-CM | POA: Diagnosis present

## 2021-03-07 DIAGNOSIS — Z743 Need for continuous supervision: Secondary | ICD-10-CM | POA: Diagnosis not present

## 2021-03-07 DIAGNOSIS — R531 Weakness: Secondary | ICD-10-CM | POA: Diagnosis not present

## 2021-03-07 DIAGNOSIS — H5462 Unqualified visual loss, left eye, normal vision right eye: Secondary | ICD-10-CM | POA: Diagnosis present

## 2021-03-07 DIAGNOSIS — N1832 Chronic kidney disease, stage 3b: Secondary | ICD-10-CM | POA: Diagnosis present

## 2021-03-07 DIAGNOSIS — G319 Degenerative disease of nervous system, unspecified: Secondary | ICD-10-CM | POA: Diagnosis not present

## 2021-03-07 DIAGNOSIS — Z79899 Other long term (current) drug therapy: Secondary | ICD-10-CM

## 2021-03-07 DIAGNOSIS — Z8546 Personal history of malignant neoplasm of prostate: Secondary | ICD-10-CM

## 2021-03-07 DIAGNOSIS — R9431 Abnormal electrocardiogram [ECG] [EKG]: Secondary | ICD-10-CM | POA: Diagnosis not present

## 2021-03-07 DIAGNOSIS — R29810 Facial weakness: Secondary | ICD-10-CM | POA: Diagnosis present

## 2021-03-07 DIAGNOSIS — Z7982 Long term (current) use of aspirin: Secondary | ICD-10-CM

## 2021-03-07 DIAGNOSIS — Z87891 Personal history of nicotine dependence: Secondary | ICD-10-CM

## 2021-03-07 DIAGNOSIS — Z85118 Personal history of other malignant neoplasm of bronchus and lung: Secondary | ICD-10-CM

## 2021-03-07 DIAGNOSIS — D539 Nutritional anemia, unspecified: Secondary | ICD-10-CM | POA: Diagnosis present

## 2021-03-07 DIAGNOSIS — G309 Alzheimer's disease, unspecified: Secondary | ICD-10-CM | POA: Diagnosis present

## 2021-03-07 DIAGNOSIS — E538 Deficiency of other specified B group vitamins: Secondary | ICD-10-CM | POA: Diagnosis present

## 2021-03-07 DIAGNOSIS — E1165 Type 2 diabetes mellitus with hyperglycemia: Secondary | ICD-10-CM | POA: Diagnosis present

## 2021-03-07 DIAGNOSIS — D696 Thrombocytopenia, unspecified: Secondary | ICD-10-CM | POA: Diagnosis present

## 2021-03-07 DIAGNOSIS — E1122 Type 2 diabetes mellitus with diabetic chronic kidney disease: Secondary | ICD-10-CM | POA: Diagnosis present

## 2021-03-07 DIAGNOSIS — I129 Hypertensive chronic kidney disease with stage 1 through stage 4 chronic kidney disease, or unspecified chronic kidney disease: Secondary | ICD-10-CM | POA: Diagnosis present

## 2021-03-07 DIAGNOSIS — E1159 Type 2 diabetes mellitus with other circulatory complications: Secondary | ICD-10-CM | POA: Diagnosis present

## 2021-03-07 DIAGNOSIS — K219 Gastro-esophageal reflux disease without esophagitis: Secondary | ICD-10-CM | POA: Diagnosis present

## 2021-03-07 DIAGNOSIS — Z85828 Personal history of other malignant neoplasm of skin: Secondary | ICD-10-CM

## 2021-03-07 LAB — DIFFERENTIAL
Abs Immature Granulocytes: 0.13 10*3/uL — ABNORMAL HIGH (ref 0.00–0.07)
Basophils Absolute: 0 10*3/uL (ref 0.0–0.1)
Basophils Relative: 0 %
Eosinophils Absolute: 0 10*3/uL (ref 0.0–0.5)
Eosinophils Relative: 1 %
Immature Granulocytes: 2 %
Lymphocytes Relative: 8 %
Lymphs Abs: 0.6 10*3/uL — ABNORMAL LOW (ref 0.7–4.0)
Monocytes Absolute: 1.4 10*3/uL — ABNORMAL HIGH (ref 0.1–1.0)
Monocytes Relative: 19 %
Neutro Abs: 5.4 10*3/uL (ref 1.7–7.7)
Neutrophils Relative %: 70 %

## 2021-03-07 LAB — COMPREHENSIVE METABOLIC PANEL
ALT: 11 U/L (ref 0–44)
AST: 11 U/L — ABNORMAL LOW (ref 15–41)
Albumin: 3.8 g/dL (ref 3.5–5.0)
Alkaline Phosphatase: 81 U/L (ref 38–126)
Anion gap: 9 (ref 5–15)
BUN: 20 mg/dL (ref 8–23)
CO2: 25 mmol/L (ref 22–32)
Calcium: 9 mg/dL (ref 8.9–10.3)
Chloride: 104 mmol/L (ref 98–111)
Creatinine, Ser: 1.5 mg/dL — ABNORMAL HIGH (ref 0.61–1.24)
GFR, Estimated: 45 mL/min — ABNORMAL LOW (ref >60)
Glucose, Bld: 229 mg/dL — ABNORMAL HIGH (ref 70–99)
Potassium: 5.2 mmol/L — ABNORMAL HIGH (ref 3.5–5.1)
Sodium: 138 mmol/L (ref 135–145)
Total Bilirubin: 0.6 mg/dL (ref 0.3–1.2)
Total Protein: 6.9 g/dL (ref 6.5–8.1)

## 2021-03-07 LAB — PROTIME-INR
INR: 1.1 (ref 0.8–1.2)
Prothrombin Time: 14 seconds (ref 11.4–15.2)

## 2021-03-07 LAB — RESP PANEL BY RT-PCR (FLU A&B, COVID) ARPGX2
Influenza A by PCR: NEGATIVE
Influenza B by PCR: NEGATIVE
SARS Coronavirus 2 by RT PCR: NEGATIVE

## 2021-03-07 LAB — CBC
HCT: 39.1 % (ref 39.0–52.0)
Hemoglobin: 13.1 g/dL (ref 13.0–17.0)
MCH: 33.8 pg (ref 26.0–34.0)
MCHC: 33.5 g/dL (ref 30.0–36.0)
MCV: 100.8 fL — ABNORMAL HIGH (ref 80.0–100.0)
Platelets: 121 10*3/uL — ABNORMAL LOW (ref 150–400)
RBC: 3.88 MIL/uL — ABNORMAL LOW (ref 4.22–5.81)
RDW: 12.2 % (ref 11.5–15.5)
WBC: 7.6 10*3/uL (ref 4.0–10.5)
nRBC: 0 % (ref 0.0–0.2)

## 2021-03-07 LAB — ETHANOL: Alcohol, Ethyl (B): <10 mg/dL (ref <10)

## 2021-03-07 LAB — APTT: aPTT: 40 seconds — ABNORMAL HIGH (ref 24–36)

## 2021-03-07 MED ORDER — LORAZEPAM 2 MG/ML IJ SOLN
1.0000 mg | Freq: Once | INTRAMUSCULAR | Status: AC | PRN
  Administered 2021-03-07: 23:00:00 1 mg via INTRAVENOUS
  Filled 2021-03-07: qty 1

## 2021-03-07 NOTE — ED Triage Notes (Signed)
[facility] reports pt coming from home c/o Left side weakness, facial droop and slurred speech, states this all started Tuesday morning. EMS states when pt walked he had a left foot drag.

## 2021-03-07 NOTE — ED Provider Notes (Signed)
[facility] EMERGENCY DEPARTMENT Provider Note   CSN: [id number] Arrival date & time: 03/07/21  1754     History Chief Complaint  Patient presents with   Cerebrovascular Accident    [name] is a 87 y.o. male.  [name] is a 87 y.o. male with a history of Alzheimer's, adenocarcinoma of the left lung, CAD, prostate cancer, pericarditis, who presents to the emergency department via EMS for evaluation of left-sided weakness and facial droop.  Symptoms started Tuesday morning when patient and family noted weakness on his left side and facial droop.  Family also reported some intermittent slurred speech.  Symptoms have been constant and have never resolved.  Patient reports he thought they might just go away but when they persisted he came in for evaluation.  He has no known history of stroke.  Has had some occasional headaches which is not uncommon for him.  He reports he already has some decreased vision in the left eye but he thinks this has been a bit blurrier than usual.  He denies any associated nausea or vomiting.  Has not had dizziness.  EMS reported that he was able to walk to the stretcher but had to drag his left foot along.  He denies any chest pain, shortness of breath, abdominal pain, nausea or vomiting.  No fevers or recent illness.  Denies recent falls or head injury.  The history is provided by the patient and the EMS personnel.      Past Medical History:  Diagnosis Date   Adenocarcinoma of left lung, stage 1 ([place]) 2001   T1N0 stage I  adenoca left lung resected 01/03/00    Alzheimer's disease ([place])    CAD (coronary artery disease) 2014   a. 09/2012 Cath: LM nl, LAD 50-60p, D1 60-70 m, D1 50ost, LCX nl, RCA min irregs, EF 55-65%.   Generalized anxiety disorder 01/08/2007   Qualifier: Diagnosis of  By: Diona Browner MD, [name]     History of colonic polyps 07/25/2011   Macular degeneration of both eyes 01/21/2014   Nonmelanoma skin cancer 07/25/2011    Multiple lesions excised face/nose   Pericarditis    a. 09/2012 with effusion and tamponade, s/p window.  b.  F/u Echo 09/19/12: mod LVH, EF 55%, Gr 1 DD, Tr MR, mild RVE, no residual effusion   Prostate CA ([place]) 04/2001   Gleason 7  S/P prostatectomy 04/11/01   SCC (squamous cell carcinoma of buccal mucosa) ([place]) 04/12/2005   BULB OF NOSE SCC IN SITU TX CX3 5FU, EXC   SCC (squamous cell carcinoma) 02/18/2013   BELOW LEFT EYE SCC IN SITU TX WITH BX   SCC (squamous cell carcinoma) 08/27/2008   RIGHT OUTER BROW FOCAL IN SITU TX WITH BX   SCC (squamous cell carcinoma) 08/27/2008   BELOW LEFT EYE FOCAL IN SITU TX CX3 5FU   SCC (squamous cell carcinoma) 10/25/2006   RIGHT ELBOW SCC IN SITU TX WITH BX CX3 5FU   SCC (squamous cell carcinoma) 04/12/2005   RIGHT NECK INF. SCC IN SITU TX CX3   SCC (squamous cell carcinoma) 04/12/2005   RIGHT NECK SUP. SCC IN SITU TX EXC   SCC (squamous cell carcinoma) 04/16/2013   LEFT TEMPLE SCC IN SITU TX CX3 5FU   SCC (squamous cell carcinoma) 04/16/2013   BELOW LEFT EYE SCC IN SITU TX CX3 5FU   SCC (squamous cell carcinoma) 04/16/2013   BELOW RIGHT EYE SCC IN SITU TX CX3 5FU  SCC (squamous cell carcinoma) 08/04/2014   LEFT CHEEK SCC IN SITU TX CX3 5FU   SCC (squamous cell carcinoma) 11/27/2018   LEFT TEMPLE SCC IN SITU TX WITH BX   SCC (squamous cell carcinoma)    SCC (squamous cell carcinoma)    SCC (squamous cell carcinoma)    SCC (squamous cell carcinoma) Well Diff 09/13/2016   Tip of Nose SCC WELL DIFF TX (MOH's), and RIGHT INNER EYE SUP. SCC IN SITU TX TO WATCH   SCC (squamous cell carcinoma) Well Diff 05/30/2017   Right Cheekbone (Cx3,5FU) and Under Left Eye (Cx3,5FU)   Squamous cell carcinoma in situ (SCCIS) 04/12/2005   Right Neck Inf (Cx3), Right Neck Sup (Exc), and Bulb of Nose (Cx3,Exc)   Squamous cell carcinoma in situ (SCCIS) 09/27/2005   Nose (Cx3,Exc)   Squamous cell carcinoma in situ (SCCIS) 02/18/2013   Below Left Eye (tx p bx)    Squamous cell carcinoma in situ (SCCIS) 04/16/2013   Left Temple (Cx3,5FU), Below Left Eye (Cx3,5FU), Below Right Eye (Cx3,5FU)   Squamous cell carcinoma in situ (SCCIS) 08/04/2014   Left Cheek (Cx3,5FU)   Squamous cell carcinoma in situ (SCCIS) 09/13/2016   Right Inner Eye Sup. 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   Qualifier: Diagnosis of  By: Diona Browner MD, [name]     Type 2 DM with CKD stage 3 and hypertension ([place]) 07/16/2015   Unspecified essential hypertension     Patient Active Problem List   Diagnosis Date Noted   COVID-19 01/01/2021   Sensorineural hearing loss (SNHL) of both ears 05/15/2017   Tinnitus, bilateral 05/15/2017   Cough 06/08/2015   Macular degeneration of both eyes 01/21/2014   Tobacco abuse, 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/08/2013   CAD (coronary artery disease)    Pericardial tamponade 09/17/2012   Acute pericarditis, unspecified 09/13/2012   Adenocarcinoma of left lung, stage 1 ([place]) 07/25/2011   Cancer of prostate w/med recur risk (T2b-c or Gleason 7 or PSA 10-20) ([place]) 07/25/2011   Nonmelanoma skin cancer 07/25/2011   History of colonic polyps 07/25/2011   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/21/2007   HYPERCHOLESTEROLEMIA 05/21/2007   CARCINOMA, SKIN, SQUAMOUS CELL 01/08/2007   Generalized anxiety disorder 01/08/2007   ALZHEIMER'S DISEASE, MILD 01/08/2007   Essential hypertension 01/08/2007    Past Surgical History:  Procedure Laterality Date   HERNIA REPAIR     LEFT HEART CATHETERIZATION WITH CORONARY ANGIOGRAM N/A 09/13/2012   Procedure: LEFT HEART CATHETERIZATION WITH CORONARY ANGIOGRAM;   Surgeon: Sherren Mocha, MD;  Location: [facility] CATH LAB;  Service: Cardiovascular;  Laterality: N/A;   LOBECTOMY  2001   upper left   PERICARDIAL TAP N/A 09/16/2012   Procedure: PERICARDIAL TAP;  Surgeon: Sinclair Grooms, MD;  Location: [facility] CATH LAB;  Service: Cardiovascular;  Laterality: N/A;   PROSTATECTOMY  2003   SUBXYPHOID PERICARDIAL WINDOW N/A 09/16/2012   Procedure: SUBXYPHOID PERICARDIAL WINDOW;  Surgeon: Rexene Alberts, MD;  Location: [place];  Service: Thoracic;  Laterality: N/A;   TONSILLECTOMY         Family History  Problem Relation  Age of Onset   Cancer Father        colon   Dementia Mother    Diabetes Mother    Cancer Brother        lung    Social History   Tobacco Use   Smoking status: Former    Packs/day: 2.00    Years: 42.00    Pack years: 84.00    Types: Cigarettes    Start date: 02/25/1953    Quit date: 04/04/1984    Years since quitting: 36.9   Smokeless tobacco: Former    Types: Chew    Quit date: 07/27/1986   Tobacco comments:    quit tobacco 26 years ago  Vaping Use   Vaping Use: Never used  Substance Use Topics   Alcohol use: No    Alcohol/week: 0.0 standard drinks   Drug use: No    Home Medications Prior to Admission medications   Medication Sig Start Date End Date Taking? Authorizing Provider  aspirin EC 81 MG tablet Take 81 mg by mouth daily.    [provider]  diphenhydrAMINE (BENADRYL) 25 MG tablet Take 25 mg by mouth 2 (two) times daily.    [provider]  donepezil (ARICEPT) 10 MG tablet TAKE 1 TABLET BY MOUTH EVERYDAY AT BEDTIME 02/14/21   Copland, Spencer, MD  glipiZIDE (GLUCOTROL) 5 MG tablet TAKE 1 TABLET BY MOUTH EVERY DAY BEFORE BREAKFAST 01/06/21   Copland, Frederico Hamman, MD  Multiple Vitamins-Minerals (ICAPS LUTEIN & ZEAXANTHIN PO) Take 1 capsule by mouth 2 (two) times daily. I - Caps    [provider]  Omega-3 Fatty Acids (FISH OIL) 1000 MG CAPS Take 1,200 mg by mouth in the morning and at bedtime.     [provider]  omeprazole (PRILOSEC) 20 MG capsule Take 20 mg by mouth 2 (two) times daily before a meal.    [provider]  sertraline (ZOLOFT) 100 MG tablet TAKE 1 TABLET BY MOUTH EVERY DAY 01/06/21   Copland, Frederico Hamman, MD  tobramycin (TOBREX) 0.3 % ophthalmic solution PLEASE SEE ATTACHED FOR DETAILED DIRECTIONS 12/16/20   [provider]    Allergies    Sulfa antibiotics  Review of Systems   Review of Systems  Constitutional:  Negative for chills and fever.  HENT: Negative.    Eyes:  Positive for visual disturbance. Negative for pain.  Respiratory:  Negative for cough and shortness of breath.   Cardiovascular:  Negative for chest pain.  Gastrointestinal:  Negative for abdominal pain, nausea and vomiting.  Genitourinary:  Negative for dysuria and frequency.  Musculoskeletal:  Negative for arthralgias and myalgias.  Skin:  Negative for color change and rash.  Neurological:  Positive for facial asymmetry, weakness and headaches. Negative for dizziness, tremors, seizures, syncope, speech difficulty, light-headedness and numbness.  All other systems reviewed and are negative.  Physical Exam Updated Vital Signs BP (!) 158/79   Pulse 74   Temp 98.4 F (36.9 C) (Oral)   Resp (!) 22   Ht 5\' 9"  (1.753 m)   Wt 82.6 kg   SpO2 95%   BMI 26.88 kg/m   Physical Exam Vitals and nursing note reviewed.  Constitutional:      General: He is not in acute distress.    Appearance: Normal appearance. He is well-developed. He is not diaphoretic.  HENT:     Head: Normocephalic and atraumatic.  Eyes:     General:        Right eye: No discharge.  Left eye: No discharge.     Extraocular Movements: Extraocular movements intact.     Pupils: Pupils are equal, round, and reactive to light.     Comments: Visual fields intact, no nystagmus.  Cardiovascular:     Rate and Rhythm: Normal rate and regular rhythm.     Pulses: Normal pulses.     Heart sounds: Normal  heart sounds.  Pulmonary:     Effort: Pulmonary effort is normal. No respiratory distress.     Breath sounds: Normal breath sounds. No wheezing or rales.     Comments: Respirations equal and unlabored, patient able to speak in full sentences, lungs clear to auscultation bilaterally  Abdominal:     General: Bowel sounds are normal. There is no distension.     Palpations: Abdomen is soft. There is no mass.     Tenderness: There is no abdominal tenderness. There is no guarding.     Comments: Abdomen soft, nondistended, nontender to palpation in all quadrants without guarding or peritoneal signs  Musculoskeletal:        General: No deformity.     Cervical back: Neck supple.  Skin:    General: Skin is warm and dry.     Capillary Refill: Capillary refill takes less than 2 seconds.  Neurological:     Mental Status: He is alert and oriented to person, place, and time.     Cranial Nerves: Facial asymmetry present.     Motor: Weakness present.     Coordination: Coordination normal.     Comments: Speech is clear, able to follow commands Left-sided facial droop, all other cranial nerves grossly intact. Right upper and lower extremities with 5/5 strength, left upper and lower extremities with 3/5 strength. Sensation normal to light and sharp touch Moves extremities without ataxia, coordination intact  Psychiatric:        Mood and Affect: Mood normal.        Behavior: Behavior normal.    ED Results / Procedures / Treatments   Labs (all labs ordered are listed, but only abnormal results are displayed) Labs Reviewed  APTT - Abnormal; Notable for the following components:      Result Value   aPTT 40 (*)    All other components within normal limits  CBC - Abnormal; Notable for the following components:   RBC 3.88 (*)    MCV 100.8 (*)    Platelets 121 (*)    All other components within normal limits  DIFFERENTIAL - Abnormal; Notable for the following components:   Lymphs Abs 0.6 (*)     Monocytes Absolute 1.4 (*)    Abs Immature Granulocytes 0.13 (*)    All other components within normal limits  COMPREHENSIVE METABOLIC PANEL - Abnormal; Notable for the following components:   Potassium 5.2 (*)    Glucose, Bld 229 (*)    Creatinine, Ser 1.50 (*)    AST 11 (*)    GFR, Estimated 45 (*)    All other components within normal limits  RESP PANEL BY RT-PCR (FLU A&B, COVID) ARPGX2  ETHANOL  PROTIME-INR  RAPID URINE DRUG SCREEN, HOSP PERFORMED  URINALYSIS, ROUTINE W REFLEX MICROSCOPIC    EKG None  Radiology CT Head Wo Contrast  Result Date: 03/07/2021 CLINICAL DATA:  Altered mental status stroke suspected. EXAM: CT HEAD WITHOUT CONTRAST TECHNIQUE: Contiguous axial images were obtained from the base of the skull through the vertex without intravenous contrast. COMPARISON:  No comparison imaging is available, reports from 2004 are noted  and reports from 2001 as well. FINDINGS: Brain: No evidence of acute infarction, hemorrhage, hydrocephalus, extra-axial collection or mass lesion/mass effect. Severe cerebral atrophy. Atrophy reported on previous imaging as severe. Vascular: No hyperdense vessel or unexpected calcification. Skull: Normal. Negative for fracture or focal lesion. Sinuses/Orbits: Visualized paranasal sinuses and orbits without acute process. Other: None. IMPRESSION: No acute intracranial pathology. Severe cerebral atrophy. Electronically Signed   By: Zetta Bills M.D.   On: 03/07/2021 19:57   DG Chest Portable 1 View  Result Date: 03/07/2021 CLINICAL DATA:  Shortness of breath. Left-sided weakness. Stroke presentation. EXAM: PORTABLE CHEST 1 VIEW COMPARISON:  05/18/2020 FINDINGS: The right lung is clear. There is probably been lobectomy on the left. There is chronic pleural and parenchymal scarring on the left. No sign of active infiltrate, mass, effusion or collapse. No acute bone finding. IMPRESSION: Status post lobectomy on the left with chronic scarring. No active  process evident. Electronically Signed   By: Nelson Chimes M.D.   On: 03/07/2021 20:10    Procedures Procedures   Medications Ordered in ED Medications  LORazepam (ATIVAN) injection 1 mg (1 mg Intravenous Given 03/07/21 2323)    ED Course  I have reviewed the triage vital signs and the nursing notes.  Pertinent labs & imaging results that were available during my care of the patient were reviewed by me and considered in my medical decision making (see chart for details).    MDM Rules/Calculators/A&P                           87 year old male presents via EMS for left-sided weakness and facial droop.  Symptoms have been present since Tuesday with no improvement.  No known history of stroke.  On exam patient has left-sided facial and 3/5 weakness in the left upper and lower extremities.  No other neurologic deficits noted.  Will initiate stroke work-up.  I have independently ordered, reviewed and interpreted all labs and imaging: CBC: No leukocytosis, normal hemoglobin CMP: creatinine 1.5, improved from prior, glucose 229, potassium is minimally elevated at 5.2. Ethanol: Negative COVID/flu: Negative  CT of the head with severe cerebral atrophy.  No other acute abnormalities noted.  Will consult neurology given high clinical suspicion for stroke.  Case discussed with Dr. Rory Percy with neurology who reviewed patient's CT patient also has bilateral hygromas and expansion of this could also be contributing to patient's symptoms.  Recommends MRI of the brain, if stroke is present will need to be admitted for stroke work-up, but if no stroke will need neurosurgery consult regarding hygromas.  At shift change MRI is pending, care signed out to [facility] who will follow up on MRI results and disposition appropriately.  Final Clinical Impression(s) / ED Diagnoses Final diagnoses:  Left-sided weakness    Rx / DC Orders ED Discharge Orders     None        Jacqlyn Larsen,  PA-C 03/08/21 0000    Tegeler, Gwenyth Allegra, MD 03/08/21 0009

## 2021-03-07 NOTE — ED Notes (Signed)
Patient transported to MRI 

## 2021-03-07 NOTE — ED Provider Notes (Signed)
23:40: Assumed care of patient from [place] @ change of shift pending MRI and admission.   Please see prior provider note for full H&P.  Briefly patient is an 87 yo male who presented with left sided weakness & facial droop x6 days.   CBC: Mild thrombocytopenia.  CMP: mild hyperkalemia, renal function improved some from prior.  Ethanol: WNL APTT: minimal elevation PT/INR: WNL Covid/flu: Negative   MRI brain: 1. Small foci of acute ischemia within the ventral right pons and left frontal periventricular white matter. No hemorrhage or mass effect. 2. Diffuse, severe atrophy, worst in the frontal and parietal lobes. 3. Bilateral subdural hygromas  00:50: CONSULT: Discussed with neurohospitalist Dr. Rory Percy- will see patient, admit to medicine.   01:13: CONSULT: Discussed with hospitlaist Dr. Velia Meyer- accepts admission.     Results for orders placed or performed during the hospital encounter of 03/07/21  Resp Panel by RT-PCR (Flu A&B, Covid) Nasopharyngeal Swab   Specimen: Nasopharyngeal Swab; Nasopharyngeal(NP) swabs in vial transport medium  Result Value Ref Range   SARS Coronavirus 2 by RT PCR NEGATIVE NEGATIVE   Influenza A by PCR NEGATIVE NEGATIVE   Influenza B by PCR NEGATIVE NEGATIVE  Ethanol  Result Value Ref Range   Alcohol, Ethyl (B) <10 <10 mg/dL  Protime-INR  Result Value Ref Range   Prothrombin Time 14.0 11.4 - 15.2 seconds   INR 1.1 0.8 - 1.2  APTT  Result Value Ref Range   aPTT 40 (H) 24 - 36 seconds  CBC  Result Value Ref Range   WBC 7.6 4.0 - 10.5 K/uL   RBC 3.88 (L) 4.22 - 5.81 MIL/uL   Hemoglobin 13.1 13.0 - 17.0 g/dL   HCT 39.1 39.0 - 52.0 %   MCV 100.8 (H) 80.0 - 100.0 fL   MCH 33.8 26.0 - 34.0 pg   MCHC 33.5 30.0 - 36.0 g/dL   RDW 12.2 11.5 - 15.5 %   Platelets 121 (L) 150 - 400 K/uL   nRBC 0.0 0.0 - 0.2 %  Differential  Result Value Ref Range   Neutrophils Relative % 70 %   Neutro Abs 5.4 1.7 - 7.7 K/uL   Lymphocytes Relative 8 %   Lymphs Abs 0.6  (L) 0.7 - 4.0 K/uL   Monocytes Relative 19 %   Monocytes Absolute 1.4 (H) 0.1 - 1.0 K/uL   Eosinophils Relative 1 %   Eosinophils Absolute 0.0 0.0 - 0.5 K/uL   Basophils Relative 0 %   Basophils Absolute 0.0 0.0 - 0.1 K/uL   Immature Granulocytes 2 %   Abs Immature Granulocytes 0.13 (H) 0.00 - 0.07 K/uL  Comprehensive metabolic panel  Result Value Ref Range   Sodium 138 135 - 145 mmol/L   Potassium 5.2 (H) 3.5 - 5.1 mmol/L   Chloride 104 98 - 111 mmol/L   CO2 25 22 - 32 mmol/L   Glucose, Bld 229 (H) 70 - 99 mg/dL   BUN 20 8 - 23 mg/dL   Creatinine, Ser 1.50 (H) 0.61 - 1.24 mg/dL   Calcium 9.0 8.9 - 10.3 mg/dL   Total Protein 6.9 6.5 - 8.1 g/dL   Albumin 3.8 3.5 - 5.0 g/dL   AST 11 (L) 15 - 41 U/L   ALT 11 0 - 44 U/L   Alkaline Phosphatase 81 38 - 126 U/L   Total Bilirubin 0.6 0.3 - 1.2 mg/dL   GFR, Estimated 45 (L) >60 mL/min   Anion gap 9 5 - 15   CT  Head Wo Contrast  Result Date: 03/07/2021 CLINICAL DATA:  Altered mental status stroke suspected. EXAM: CT HEAD WITHOUT CONTRAST TECHNIQUE: Contiguous axial images were obtained from the base of the skull through the vertex without intravenous contrast. COMPARISON:  No comparison imaging is available, reports from 2004 are noted and reports from 2001 as well. FINDINGS: Brain: No evidence of acute infarction, hemorrhage, hydrocephalus, extra-axial collection or mass lesion/mass effect. Severe cerebral atrophy. Atrophy reported on previous imaging as severe. Vascular: No hyperdense vessel or unexpected calcification. Skull: Normal. Negative for fracture or focal lesion. Sinuses/Orbits: Visualized paranasal sinuses and orbits without acute process. Other: None. IMPRESSION: No acute intracranial pathology. Severe cerebral atrophy. Electronically Signed   By: Zetta Bills M.D.   On: 03/07/2021 19:57   MR BRAIN WO CONTRAST  Result Date: 03/08/2021 CLINICAL DATA:  Left-sided weakness with facial droop EXAM: MRI HEAD WITHOUT CONTRAST  TECHNIQUE: Multiplanar, multiecho pulse sequences of the brain and surrounding structures were obtained without intravenous contrast. COMPARISON:  None. FINDINGS: Brain: There is a small focus of abnormal diffusion restriction the ventral right pons. There is also a punctate focus of diffusion restriction in the left frontal periventricular white matter. No acute or chronic hemorrhage. There is multifocal hyperintense T2-weighted signal within the white matter. Diffuse, severe atrophy, worst in the frontal and parietal lobes. Bilateral subdural hygromas. The midline structures are normal. Vascular: Major flow voids are preserved. Skull and upper cervical spine: Normal calvarium and skull base. Visualized upper cervical spine and soft tissues are normal. Sinuses/Orbits:No paranasal sinus fluid levels or advanced mucosal thickening. No mastoid or middle ear effusion. Normal orbits. IMPRESSION: 1. Small foci of acute ischemia within the ventral right pons and left frontal periventricular white matter. No hemorrhage or mass effect. 2. Diffuse, severe atrophy, worst in the frontal and parietal lobes. 3. Bilateral subdural hygromas. Electronically Signed   By: Ulyses Jarred M.D.   On: 03/08/2021 00:30   DG Chest Portable 1 View  Result Date: 03/07/2021 CLINICAL DATA:  Shortness of breath. Left-sided weakness. Stroke presentation. EXAM: PORTABLE CHEST 1 VIEW COMPARISON:  05/18/2020 FINDINGS: The right lung is clear. There is probably been lobectomy on the left. There is chronic pleural and parenchymal scarring on the left. No sign of active infiltrate, mass, effusion or collapse. No acute bone finding. IMPRESSION: Status post lobectomy on the left with chronic scarring. No active process evident. Electronically Signed   By: Nelson Chimes M.D.   On: 03/07/2021 20:10    Procedures      Amaryllis Dyke, PA-C 03/08/21 0113    Orpah Greek, MD 03/08/21 [phone]

## 2021-03-08 ENCOUNTER — Encounter (HOSPITAL_COMMUNITY): Payer: Self-pay | Admitting: Internal Medicine

## 2021-03-08 ENCOUNTER — Observation Stay (HOSPITAL_COMMUNITY): Payer: Medicare HMO

## 2021-03-08 ENCOUNTER — Telehealth: Payer: Self-pay

## 2021-03-08 DIAGNOSIS — I639 Cerebral infarction, unspecified: Secondary | ICD-10-CM | POA: Diagnosis not present

## 2021-03-08 DIAGNOSIS — N1832 Chronic kidney disease, stage 3b: Secondary | ICD-10-CM | POA: Diagnosis not present

## 2021-03-08 DIAGNOSIS — F411 Generalized anxiety disorder: Secondary | ICD-10-CM | POA: Diagnosis present

## 2021-03-08 DIAGNOSIS — E119 Type 2 diabetes mellitus without complications: Secondary | ICD-10-CM | POA: Diagnosis not present

## 2021-03-08 DIAGNOSIS — E1122 Type 2 diabetes mellitus with diabetic chronic kidney disease: Secondary | ICD-10-CM | POA: Diagnosis not present

## 2021-03-08 DIAGNOSIS — E875 Hyperkalemia: Secondary | ICD-10-CM | POA: Diagnosis present

## 2021-03-08 DIAGNOSIS — I651 Occlusion and stenosis of basilar artery: Secondary | ICD-10-CM | POA: Diagnosis not present

## 2021-03-08 DIAGNOSIS — I6389 Other cerebral infarction: Secondary | ICD-10-CM | POA: Diagnosis not present

## 2021-03-08 DIAGNOSIS — I69391 Dysphagia following cerebral infarction: Secondary | ICD-10-CM | POA: Diagnosis not present

## 2021-03-08 DIAGNOSIS — I6350 Cerebral infarction due to unspecified occlusion or stenosis of unspecified cerebral artery: Secondary | ICD-10-CM | POA: Diagnosis not present

## 2021-03-08 DIAGNOSIS — I129 Hypertensive chronic kidney disease with stage 1 through stage 4 chronic kidney disease, or unspecified chronic kidney disease: Secondary | ICD-10-CM | POA: Diagnosis present

## 2021-03-08 DIAGNOSIS — I6381 Other cerebral infarction due to occlusion or stenosis of small artery: Secondary | ICD-10-CM | POA: Diagnosis not present

## 2021-03-08 DIAGNOSIS — G8194 Hemiplegia, unspecified affecting left nondominant side: Secondary | ICD-10-CM | POA: Diagnosis not present

## 2021-03-08 DIAGNOSIS — K5900 Constipation, unspecified: Secondary | ICD-10-CM | POA: Diagnosis present

## 2021-03-08 DIAGNOSIS — D509 Iron deficiency anemia, unspecified: Secondary | ICD-10-CM | POA: Diagnosis present

## 2021-03-08 DIAGNOSIS — Z20822 Contact with and (suspected) exposure to covid-19: Secondary | ICD-10-CM | POA: Diagnosis not present

## 2021-03-08 DIAGNOSIS — K219 Gastro-esophageal reflux disease without esophagitis: Secondary | ICD-10-CM

## 2021-03-08 DIAGNOSIS — R531 Weakness: Secondary | ICD-10-CM | POA: Diagnosis not present

## 2021-03-08 DIAGNOSIS — G309 Alzheimer's disease, unspecified: Secondary | ICD-10-CM | POA: Diagnosis not present

## 2021-03-08 DIAGNOSIS — E1159 Type 2 diabetes mellitus with other circulatory complications: Secondary | ICD-10-CM

## 2021-03-08 DIAGNOSIS — D539 Nutritional anemia, unspecified: Secondary | ICD-10-CM | POA: Diagnosis present

## 2021-03-08 DIAGNOSIS — H3530 Unspecified macular degeneration: Secondary | ICD-10-CM | POA: Diagnosis present

## 2021-03-08 DIAGNOSIS — H538 Other visual disturbances: Secondary | ICD-10-CM | POA: Diagnosis present

## 2021-03-08 DIAGNOSIS — Z8673 Personal history of transient ischemic attack (TIA), and cerebral infarction without residual deficits: Secondary | ICD-10-CM | POA: Diagnosis not present

## 2021-03-08 DIAGNOSIS — E1165 Type 2 diabetes mellitus with hyperglycemia: Secondary | ICD-10-CM | POA: Diagnosis present

## 2021-03-08 DIAGNOSIS — R69 Illness, unspecified: Secondary | ICD-10-CM | POA: Diagnosis not present

## 2021-03-08 DIAGNOSIS — F0284 Dementia in other diseases classified elsewhere, unspecified severity, with anxiety: Secondary | ICD-10-CM | POA: Diagnosis present

## 2021-03-08 DIAGNOSIS — E785 Hyperlipidemia, unspecified: Secondary | ICD-10-CM | POA: Diagnosis present

## 2021-03-08 DIAGNOSIS — R29704 NIHSS score 4: Secondary | ICD-10-CM | POA: Diagnosis not present

## 2021-03-08 DIAGNOSIS — N179 Acute kidney failure, unspecified: Secondary | ICD-10-CM | POA: Diagnosis not present

## 2021-03-08 DIAGNOSIS — Z23 Encounter for immunization: Secondary | ICD-10-CM | POA: Diagnosis present

## 2021-03-08 DIAGNOSIS — E538 Deficiency of other specified B group vitamins: Secondary | ICD-10-CM | POA: Diagnosis present

## 2021-03-08 DIAGNOSIS — D696 Thrombocytopenia, unspecified: Secondary | ICD-10-CM | POA: Diagnosis not present

## 2021-03-08 DIAGNOSIS — R29810 Facial weakness: Secondary | ICD-10-CM | POA: Diagnosis not present

## 2021-03-08 DIAGNOSIS — I2510 Atherosclerotic heart disease of native coronary artery without angina pectoris: Secondary | ICD-10-CM | POA: Diagnosis present

## 2021-03-08 DIAGNOSIS — I6621 Occlusion and stenosis of right posterior cerebral artery: Secondary | ICD-10-CM | POA: Diagnosis not present

## 2021-03-08 LAB — COMPREHENSIVE METABOLIC PANEL
ALT: 6 U/L (ref 0–44)
AST: 6 U/L — ABNORMAL LOW (ref 15–41)
Albumin: 2.1 g/dL — ABNORMAL LOW (ref 3.5–5.0)
Alkaline Phosphatase: 48 U/L (ref 38–126)
Anion gap: 3 — ABNORMAL LOW (ref 5–15)
BUN: 14 mg/dL (ref 8–23)
CO2: 17 mmol/L — ABNORMAL LOW (ref 22–32)
Calcium: 5.6 mg/dL — CL (ref 8.9–10.3)
Chloride: 119 mmol/L — ABNORMAL HIGH (ref 98–111)
Creatinine, Ser: 0.9 mg/dL (ref 0.61–1.24)
GFR, Estimated: >60 mL/min (ref >60)
Glucose, Bld: 145 mg/dL — ABNORMAL HIGH (ref 70–99)
Potassium: 2.8 mmol/L — ABNORMAL LOW (ref 3.5–5.1)
Sodium: 139 mmol/L (ref 135–145)
Total Bilirubin: 0.6 mg/dL (ref 0.3–1.2)
Total Protein: 3.8 g/dL — ABNORMAL LOW (ref 6.5–8.1)

## 2021-03-08 LAB — BASIC METABOLIC PANEL
Anion gap: 7 (ref 5–15)
BUN: 17 mg/dL (ref 8–23)
CO2: 23 mmol/L (ref 22–32)
Calcium: 8.7 mg/dL — ABNORMAL LOW (ref 8.9–10.3)
Chloride: 105 mmol/L (ref 98–111)
Creatinine, Ser: 1.3 mg/dL — ABNORMAL HIGH (ref 0.61–1.24)
GFR, Estimated: 53 mL/min — ABNORMAL LOW (ref >60)
Glucose, Bld: 278 mg/dL — ABNORMAL HIGH (ref 70–99)
Potassium: 4.6 mmol/L (ref 3.5–5.1)
Sodium: 135 mmol/L (ref 135–145)

## 2021-03-08 LAB — CBG MONITORING, ED
Glucose-Capillary: 214 mg/dL — ABNORMAL HIGH (ref 70–99)
Glucose-Capillary: 250 mg/dL — ABNORMAL HIGH (ref 70–99)
Glucose-Capillary: 198 mg/dL — ABNORMAL HIGH (ref 70–99)

## 2021-03-08 LAB — RAPID URINE DRUG SCREEN, HOSP PERFORMED
Amphetamines: NOT DETECTED
Barbiturates: NOT DETECTED
Benzodiazepines: POSITIVE — AB
Cocaine: NOT DETECTED
Opiates: NOT DETECTED
Tetrahydrocannabinol: NOT DETECTED

## 2021-03-08 LAB — ECHOCARDIOGRAM COMPLETE
Area-P 1/2: 5.27 cm2
Height: 69 in
S' Lateral: 3.4 cm
Weight: 2912 oz

## 2021-03-08 LAB — LIPID PANEL
Cholesterol: 122 mg/dL (ref 0–200)
HDL: 17 mg/dL — ABNORMAL LOW (ref >40)
LDL Cholesterol: 83 mg/dL (ref 0–99)
Total CHOL/HDL Ratio: 7.2 RATIO
Triglycerides: 109 mg/dL (ref <150)
VLDL: 22 mg/dL (ref 0–40)

## 2021-03-08 LAB — HEMOGLOBIN A1C
Hgb A1c MFr Bld: 8.3 % — ABNORMAL HIGH (ref 4.8–5.6)
Mean Plasma Glucose: 191.51 mg/dL

## 2021-03-08 LAB — CBC
HCT: 35.6 % — ABNORMAL LOW (ref 39.0–52.0)
Hemoglobin: 11.7 g/dL — ABNORMAL LOW (ref 13.0–17.0)
MCH: 33 pg (ref 26.0–34.0)
MCHC: 32.9 g/dL (ref 30.0–36.0)
MCV: 100.3 fL — ABNORMAL HIGH (ref 80.0–100.0)
Platelets: 105 10*3/uL — ABNORMAL LOW (ref 150–400)
RBC: 3.55 MIL/uL — ABNORMAL LOW (ref 4.22–5.81)
RDW: 12.1 % (ref 11.5–15.5)
WBC: 6.2 10*3/uL (ref 4.0–10.5)
nRBC: 0 % (ref 0.0–0.2)

## 2021-03-08 LAB — URINALYSIS, ROUTINE W REFLEX MICROSCOPIC
Bilirubin Urine: NEGATIVE
Glucose, UA: 50 mg/dL — AB
Hgb urine dipstick: NEGATIVE
Ketones, ur: 5 mg/dL — AB
Leukocytes,Ua: NEGATIVE
Nitrite: NEGATIVE
Protein, ur: NEGATIVE mg/dL
Specific Gravity, Urine: 1.018 (ref 1.005–1.030)
pH: 5 (ref 5.0–8.0)

## 2021-03-08 LAB — MAGNESIUM: Magnesium: 1.4 mg/dL — ABNORMAL LOW (ref 1.7–2.4)

## 2021-03-08 MED ORDER — FOOD THICKENER (SIMPLYTHICK)
10.0000 | ORAL | Status: DC | PRN
  Filled 2021-03-08: qty 10

## 2021-03-08 MED ORDER — ENOXAPARIN SODIUM 40 MG/0.4ML IJ SOSY
40.0000 mg | PREFILLED_SYRINGE | INTRAMUSCULAR | Status: DC
  Administered 2021-03-08 – 2021-03-11 (×4): 40 mg via SUBCUTANEOUS
  Filled 2021-03-08 (×4): qty 0.4

## 2021-03-08 MED ORDER — ACETAMINOPHEN 325 MG PO TABS: 650.0000 mg | ORAL_TABLET | Freq: Four times a day (QID) | ORAL | Status: DC | PRN

## 2021-03-08 MED ORDER — STROKE: EARLY STAGES OF RECOVERY BOOK
Freq: Once | Status: AC
  Filled 2021-03-08: qty 1

## 2021-03-08 MED ORDER — INSULIN ASPART 100 UNIT/ML IJ SOLN
0.0000 [IU] | Freq: Three times a day (TID) | INTRAMUSCULAR | Status: DC
  Administered 2021-03-08: 2 [IU] via SUBCUTANEOUS
  Administered 2021-03-08 – 2021-03-10 (×5): 3 [IU] via SUBCUTANEOUS
  Administered 2021-03-10: 2 [IU] via SUBCUTANEOUS
  Administered 2021-03-10: 3 [IU] via SUBCUTANEOUS
  Administered 2021-03-11: 5 [IU] via SUBCUTANEOUS
  Administered 2021-03-11 (×2): 2 [IU] via SUBCUTANEOUS
  Administered 2021-03-12: 5 [IU] via SUBCUTANEOUS
  Administered 2021-03-12: 3 [IU] via SUBCUTANEOUS

## 2021-03-08 MED ORDER — ACETAMINOPHEN 650 MG RE SUPP: 650.0000 mg | Freq: Four times a day (QID) | RECTAL | Status: DC | PRN

## 2021-03-08 MED ORDER — PANTOPRAZOLE SODIUM 40 MG PO TBEC
40.0000 mg | DELAYED_RELEASE_TABLET | Freq: Every day | ORAL | Status: DC
  Administered 2021-03-08 – 2021-03-12 (×5): 40 mg via ORAL
  Filled 2021-03-08 (×5): qty 1

## 2021-03-08 MED ORDER — MAGNESIUM SULFATE 2 GM/50ML IV SOLN
2.0000 g | Freq: Once | INTRAVENOUS | Status: AC
  Administered 2021-03-08: 2 g via INTRAVENOUS
  Filled 2021-03-08: qty 50

## 2021-03-08 MED ORDER — SERTRALINE HCL 100 MG PO TABS
100.0000 mg | ORAL_TABLET | Freq: Every day | ORAL | Status: DC
  Administered 2021-03-09 – 2021-03-12 (×4): 100 mg via ORAL
  Filled 2021-03-08 (×4): qty 1

## 2021-03-08 MED ORDER — CLOPIDOGREL BISULFATE 75 MG PO TABS
75.0000 mg | ORAL_TABLET | Freq: Every day | ORAL | Status: DC
  Administered 2021-03-08 – 2021-03-12 (×5): 75 mg via ORAL
  Filled 2021-03-08 (×5): qty 1

## 2021-03-08 MED ORDER — ASPIRIN EC 81 MG PO TBEC
81.0000 mg | DELAYED_RELEASE_TABLET | Freq: Every day | ORAL | Status: DC
  Administered 2021-03-08 – 2021-03-12 (×5): 81 mg via ORAL
  Filled 2021-03-08 (×5): qty 1

## 2021-03-08 MED ORDER — DONEPEZIL HCL 10 MG PO TABS
10.0000 mg | ORAL_TABLET | Freq: Every day | ORAL | Status: DC
  Administered 2021-03-08 – 2021-03-11 (×4): 10 mg via ORAL
  Filled 2021-03-08 (×5): qty 1

## 2021-03-08 MED ORDER — ATORVASTATIN CALCIUM 10 MG PO TABS
20.0000 mg | ORAL_TABLET | Freq: Every day | ORAL | Status: DC
  Administered 2021-03-08 – 2021-03-11 (×4): 20 mg via ORAL
  Filled 2021-03-08 (×4): qty 2

## 2021-03-08 NOTE — Progress Notes (Signed)
Modified Barium Swallow Progress Note  Patient Details  Name: [name] MRN: [id number] Date of Birth: 29-Mar-1931  Today's Date: 03/08/2021  Modified Barium Swallow completed.  Full report located under Chart Review in the Imaging Section.  Brief recommendations include the following:  Clinical Impression  Patient presents with a mild oropharyngeal dysphagia as per this MBS. He exhibited mildly delayed mastication of regular solids and reduced anterior to posterior transit of puree solids, barium tablet and regular solids. Swallow was initiated at level of vallecular sinus with puree solids, nectar thick liquids and regular solids and initiated at level of almost at pyriform sinus with thin liquids. Silent, trace aspiration occured during the swallow with each small sip of thin liquids (PAS 8) secondary to reduced airway protection. No aspiration or penetration observed with nectar thick liquids even with straw sips and taking barium tablet with nectar thick liquid sip. No significant amount of pharyngeal residuals remained post initial swallows. Appearance of prominent cricopharyngeal bar as well as questionable cervical osteophytes ( no radiologist present to confirm) resulted in mildly reduced transit but no retention of barium. No esophageal backflow observed and esophageal sweep did not reveal anything significant. SLP is recommending Dys 3 solids, nectar thick liquids at this time.   Swallow Evaluation Recommendations       SLP Diet Recommendations: Dysphagia 3 (Mech soft) solids;Nectar thick liquid   Liquid Administration via: Cup;Straw   Medication Administration: Whole meds with liquid   Supervision: Patient able to self feed   Compensations: Minimize environmental distractions;Slow rate;Small sips/bites       Oral Care Recommendations: Oral care BID   Other Recommendations: Order thickener from pharmacy;Prohibited food (jello, ice cream, thin soups);Remove water  pitcher;Clarify dietary restrictions   Sonia Baller, MA, CCC-SLP Speech Therapy

## 2021-03-08 NOTE — Progress Notes (Signed)
[facility] PROGRESS NOTE   [name] JIR:[id number] DOB: 07-07-1930 DOA: 03/07/2021  PCP: [name], MD  Brief History/Interval Summary: [name] is a 87 y.o. male with medical history significant for Alzheimer dementia, type 2 diabetes mellitus, essential hypertension, stage IIIb chronic kidney disease with baseline creatinine 1.5-1.9,  who is admitted to [facility] on 03/07/2021 with acute ischemic CVA after presenting from home to [facility] ED complaining of left-sided weakness.    Consultants: Neurology  Procedures: Transthoracic echocardiogram is pending.  Carotid Doppler.    Subjective/Interval History: Patient pleasantly confused from his dementia.  Denies any chest pain shortness of breath.  No nausea or vomiting.  History is limited.     Assessment/Plan:  Acute stroke Patient with left-sided hemiparesis and facial droop.  MRI confirmed stroke.  Neurology has been consulted. LDL is 83.  HbA1c 8.3.   Echocardiogram is pending.  No significant stenosis noted on carotid Doppler. PT and OT evaluation is pending. Speech therapy evaluation. Patient currently on aspirin. Blood pressure noted to be elevated.  Allow permissive hypertension for now.  Not on any antihypertensives at home  History of dementia Pleasantly confused.  Continue Aricept.  Baseline functional status is not entirely clear.  Abnormal labs/hyperkalemia Some of the results were spurious.  Labs repeated.  Potassium noted to be normal.  Chronic kidney disease stage IIIb Renal function at baseline.  Monitor urine output.  Avoid nephrotoxic agents.  Macrocytic anemia Check anemia panel.  Check TSH.  No evidence of overt blood loss.  Diabetes mellitus type 2, uncontrolled with hyperglycemia Patient on glipizide at home.  HbA1c is 8.3.  Continue SSI.  Monitor CBGs.  May need to adjust her medication regimen.  History of generalized anxiety disorder Continue Zoloft.   DVT  Prophylaxis: Initiate Lovenox Code Status: Full code Family Communication:  No family at bedside Disposition Plan: To be determined  Status is: Observation  The patient will require care spanning > 2 midnights and should be moved to inpatient because: Stroke work-up.  Left hemiparesis.  PT and OT evaluation.      Medications: Scheduled:  aspirin EC  81 mg Oral Daily   donepezil  10 mg Oral QHS   insulin aspart  0-9 Units Subcutaneous TID WC   pantoprazole  40 mg Oral Daily   sertraline  100 mg Oral Daily   Continuous: BPZ:WCHENIDPOEUMP **OR** acetaminophen  Antibiotics: Anti-infectives (From admission, onward)    None       Objective:  Vital Signs  Vitals:   03/08/21 0545 03/08/21 0615 03/08/21 0715 03/08/21 0915  BP: (!) 153/69 (!) 155/83 (!) 153/71 (!) 171/84  Pulse: 68 75 69 76  Resp: 20 (!) 23 (!) 21 (!) 21  Temp:      TempSrc:      SpO2: 95% 96% 94% 97%  Weight:      Height:        Intake/Output Summary (Last 24 hours) at 03/08/2021 1135 Last data filed at 03/08/2021 0754 Gross per 24 hour  Intake 50 ml  Output --  Net 50 ml   Filed Weights   03/07/21 1801  Weight: 82.6 kg    General appearance: Awake alert.  In no distress.  Distracted Resp: Clear to auscultation bilaterally.  Normal effort Cardio: S1-S2 is normal regular.  No S3-S4.  No rubs murmurs or bruit GI: Abdomen is soft.  Nontender nondistended.  Bowel sounds are present normal.  No masses organomegaly Extremities: No edema.  Neurologic: Disoriented.  Left-sided weakness noted.  Left facial droop noted.   Lab Results:  Data Reviewed: I have personally reviewed following labs and imaging studies  CBC: Recent Labs  Lab 03/07/21 1928 03/08/21 0553  WBC 7.6 6.2  NEUTROABS 5.4  --   HGB 13.1 11.7*  HCT 39.1 35.6*  MCV 100.8* 100.3*  PLT 121* 105*    Basic Metabolic Panel: Recent Labs  Lab 03/07/21 1928 03/08/21 0424 03/08/21 1037  NA 138 139 135  K 5.2* 2.8* 4.6  CL  104 119* 105  CO2 25 17* 23  GLUCOSE 229* 145* 278*  BUN 20 14 17   CREATININE 1.50* 0.90 1.30*  CALCIUM 9.0 5.6* 8.7*  MG  --  1.4*  --     GFR: Estimated Creatinine Clearance: 39.3 mL/min (A) (by C-G formula based on SCr of 1.3 mg/dL (H)).  Liver Function Tests: Recent Labs  Lab 03/07/21 1928 03/08/21 0424  AST 11* 6*  ALT 11 6  ALKPHOS 81 48  BILITOT 0.6 0.6  PROT 6.9 3.8*  ALBUMIN 3.8 2.1*     Coagulation Profile: Recent Labs  Lab 03/07/21 1928  INR 1.1     HbA1C: Recent Labs    03/08/21 0553  HGBA1C 8.3*    CBG: Recent Labs  Lab 03/08/21 0805  GLUCAP 214*    Lipid Profile: Recent Labs    03/08/21 0424  CHOL 122  HDL 17*  LDLCALC 83  TRIG 109  CHOLHDL 7.2     Recent Results (from the past 240 hour(s))  Resp Panel by RT-PCR (Flu A&B, Covid) Nasopharyngeal Swab     Status: None   Collection Time: 03/07/21  7:24 PM   Specimen: Nasopharyngeal Swab; Nasopharyngeal(NP) swabs in vial transport medium  Result Value Ref Range Status   SARS Coronavirus 2 by RT PCR NEGATIVE NEGATIVE Final    Comment: (NOTE) SARS-CoV-2 target nucleic acids are NOT DETECTED.  The SARS-CoV-2 RNA is generally detectable in upper respiratory specimens during the acute phase of infection. The lowest concentration of SARS-CoV-2 viral copies this assay can detect is 138 copies/mL. A negative result does not preclude SARS-Cov-2 infection and should not be used as the sole basis for treatment or other patient management decisions. A negative result may occur with  improper specimen collection/handling, submission of specimen other than nasopharyngeal swab, presence of viral mutation(s) within the areas targeted by this assay, and inadequate number of viral copies(<138 copies/mL). A negative result must be combined with clinical observations, patient history, and epidemiological information. The expected result is Negative.  Fact Sheet for Patients:   EntrepreneurPulse.com.au  Fact Sheet for Healthcare Providers:  IncredibleEmployment.be  This test is no t yet approved or cleared by the [place] FDA and  has been authorized for detection and/or diagnosis of SARS-CoV-2 by FDA under an Emergency Use Authorization (EUA). This EUA will remain  in effect (meaning this test can be used) for the duration of the COVID-19 declaration under Section 564(b)(1) of the Act, 21 U.S.C.section 360bbb-3(b)(1), unless the authorization is terminated  or revoked sooner.       Influenza A by PCR NEGATIVE NEGATIVE Final   Influenza B by PCR NEGATIVE NEGATIVE Final    Comment: (NOTE) The Xpert Xpress SARS-CoV-2/FLU/RSV plus assay is intended as an aid in the diagnosis of influenza from Nasopharyngeal swab specimens and should not be used as a sole basis for treatment. Nasal washings and aspirates are unacceptable for Xpert Xpress SARS-CoV-2/FLU/RSV testing.  Fact Sheet for  Patients: EntrepreneurPulse.com.au  Fact Sheet for Healthcare Providers: IncredibleEmployment.be  This test is not yet approved or cleared by the [place] FDA and has been authorized for detection and/or diagnosis of SARS-CoV-2 by FDA under an Emergency Use Authorization (EUA). This EUA will remain in effect (meaning this test can be used) for the duration of the COVID-19 declaration under Section 564(b)(1) of the Act, 21 U.S.C. section 360bbb-3(b)(1), unless the authorization is terminated or revoked.  Performed at [facility], [place] [address] [place], [place] [address]       Radiology Studies: CT Head Wo Contrast  Result Date: 03/07/2021 CLINICAL DATA:  Altered mental status stroke suspected. EXAM: CT HEAD WITHOUT CONTRAST TECHNIQUE: Contiguous axial images were obtained from the base of the skull through the vertex without intravenous contrast. COMPARISON:  No comparison imaging  is available, reports from 2004 are noted and reports from 2001 as well. FINDINGS: Brain: No evidence of acute infarction, hemorrhage, hydrocephalus, extra-axial collection or mass lesion/mass effect. Severe cerebral atrophy. Atrophy reported on previous imaging as severe. Vascular: No hyperdense vessel or unexpected calcification. Skull: Normal. Negative for fracture or focal lesion. Sinuses/Orbits: Visualized paranasal sinuses and orbits without acute process. Other: None. IMPRESSION: No acute intracranial pathology. Severe cerebral atrophy. Electronically Signed   By: Zetta Bills M.D.   On: 03/07/2021 19:57   MR ANGIO HEAD WO CONTRAST  Result Date: 03/08/2021 CLINICAL DATA:  Follow-up stroke. Left-sided weakness and facial droop. Pint teen and left frontal infarctions. EXAM: MRA HEAD WITHOUT CONTRAST TECHNIQUE: Angiographic images of the Circle of Willis were acquired using MRA technique without intravenous contrast. COMPARISON:  MRI earlier same day FINDINGS: Anterior circulation: Both internal carotid arteries are widely patent through the skull base and siphon regions. The anterior and middle cerebral vessels are patent without proximal stenosis, aneurysm or vascular malformation. Posterior circulation: Both vertebral arteries are patent through the foramen magnum to the basilar. No V4 segment stenosis. The basilar artery is tortuous but widely patent. Both superior cerebellar arteries show flow. Both posterior cerebral arteries show flow. The left posterior cerebral artery receives most of it supply from the anterior circulation with a small contribution from the basilar tip. The right posterior cerebral artery arises from the basilar tip and shows a severe stenosis 1.5 cm beyond its origin. Anatomic variants: None other significant. Other: None. IMPRESSION: No intracranial large vessel occlusion. Both vertebral arteries are patent to the basilar. No basilar stenosis. Severe stenosis of the right  posterior cerebral artery 1.5 cm beyond its origin, but patent beyond that. Left PCA receives most of it supply from the anterior circulation but does have a small contribution from the basilar tip. No significant anterior circulation finding. Electronically Signed   By: Nelson Chimes M.D.   On: 03/08/2021 08:05   MR BRAIN WO CONTRAST  Result Date: 03/08/2021 CLINICAL DATA:  Left-sided weakness with facial droop EXAM: MRI HEAD WITHOUT CONTRAST TECHNIQUE: Multiplanar, multiecho pulse sequences of the brain and surrounding structures were obtained without intravenous contrast. COMPARISON:  None. FINDINGS: Brain: There is a small focus of abnormal diffusion restriction the ventral right pons. There is also a punctate focus of diffusion restriction in the left frontal periventricular white matter. No acute or chronic hemorrhage. There is multifocal hyperintense T2-weighted signal within the white matter. Diffuse, severe atrophy, worst in the frontal and parietal lobes. Bilateral subdural hygromas. The midline structures are normal. Vascular: Major flow voids are preserved. Skull and upper cervical spine: Normal calvarium and skull base.  Visualized upper cervical spine and soft tissues are normal. Sinuses/Orbits:No paranasal sinus fluid levels or advanced mucosal thickening. No mastoid or middle ear effusion. Normal orbits. IMPRESSION: 1. Small foci of acute ischemia within the ventral right pons and left frontal periventricular white matter. No hemorrhage or mass effect. 2. Diffuse, severe atrophy, worst in the frontal and parietal lobes. 3. Bilateral subdural hygromas. Electronically Signed   By: Ulyses Jarred M.D.   On: 03/08/2021 00:30   DG Chest Portable 1 View  Result Date: 03/07/2021 CLINICAL DATA:  Shortness of breath. Left-sided weakness. Stroke presentation. EXAM: PORTABLE CHEST 1 VIEW COMPARISON:  05/18/2020 FINDINGS: The right lung is clear. There is probably been lobectomy on the left. There is  chronic pleural and parenchymal scarring on the left. No sign of active infiltrate, mass, effusion or collapse. No acute bone finding. IMPRESSION: Status post lobectomy on the left with chronic scarring. No active process evident. Electronically Signed   By: Nelson Chimes M.D.   On: 03/07/2021 20:10   VAS US CAROTID  Result Date: 03/08/2021 Carotid Arterial Duplex Study Patient Name:  [name]  Date of Exam:   03/08/2021 Medical Rec #: [id number]         Accession #:    [id number] Date of Birth: Jul 09, 1930         Patient Gender: M Patient Age:   67 years Exam Location:  [facility] Procedure:      VAS US CAROTID Referring Phys: Babs Bertin --------------------------------------------------------------------------------  Indications:       CVA. Risk Factors:      Hypertension, hyperlipidemia, Diabetes, past history of                    smoking, coronary artery disease. Other Factors:     CKD3. Comparison Study:  No previous exams Performing Technologist: Jody Hill RVT, RDMS  Examination Guidelines: A complete evaluation includes B-mode imaging, spectral Doppler, color Doppler, and power Doppler as needed of all accessible portions of each vessel. Bilateral testing is considered an integral part of a complete examination. Limited examinations for reoccurring indications may be performed as noted.  Right Carotid Findings: +----------+--------+--------+--------+------------------+------------------+           PSV cm/sEDV cm/sStenosisPlaque DescriptionComments           +----------+--------+--------+--------+------------------+------------------+ CCA Prox  62      10                                intimal thickening +----------+--------+--------+--------+------------------+------------------+ CCA Distal63      10                                                   +----------+--------+--------+--------+------------------+------------------+ ICA Prox  73      12               heterogenous                         +----------+--------+--------+--------+------------------+------------------+ ICA Distal61      11                                                   +----------+--------+--------+--------+------------------+------------------+  ECA       78      0                                                    +----------+--------+--------+--------+------------------+------------------+ +----------+--------+-------+----------------+-------------------+           PSV cm/sEDV cmsDescribe        Arm Pressure (mmHG) +----------+--------+-------+----------------+-------------------+ [id number]            Multiphasic, WNL                    +----------+--------+-------+----------------+-------------------+ +---------+--------+--+--------+-+---------+ VertebralPSV cm/s44EDV cm/s7Antegrade +---------+--------+--+--------+-+---------+  Left Carotid Findings: +----------+--------+--------+--------+------------------+------------------+           PSV cm/sEDV cm/sStenosisPlaque DescriptionComments           +----------+--------+--------+--------+------------------+------------------+ CCA Prox  89      13                                intimal thickening +----------+--------+--------+--------+------------------+------------------+ CCA Distal68      9                                                    +----------+--------+--------+--------+------------------+------------------+ ICA Prox  70      14              calcific                             +----------+--------+--------+--------+------------------+------------------+ ICA Distal76      16                                                   +----------+--------+--------+--------+------------------+------------------+ ECA       62      0                                 shadowing          +----------+--------+--------+--------+------------------+------------------+  +----------+--------+--------+----------------+-------------------+           PSV cm/sEDV cm/sDescribe        Arm Pressure (mmHG) +----------+--------+--------+----------------+-------------------+ [id number]              Multiphasic, WNL                    +----------+--------+--------+----------------+-------------------+ +---------+--------+--+--------+-+---------+ VertebralPSV cm/s46EDV cm/s9Antegrade +---------+--------+--+--------+-+---------+   Summary: Right Carotid: The extracranial vessels were near-normal with only minimal wall                thickening or plaque. Left Carotid: The extracranial vessels were near-normal with only minimal wall               thickening or plaque. Vertebrals:  Bilateral vertebral arteries demonstrate antegrade flow. Subclavians: Normal flow hemodynamics were seen in bilateral subclavian              arteries. *See table(s) above for measurements and observations.  Preliminary        LOS: 0 days   [employer] on [email or web address]  03/08/2021, 11:35 AM

## 2021-03-08 NOTE — Evaluation (Signed)
Occupational Therapy Evaluation Patient Details Name: [name] MRN: [id number] DOB: Aug 14, 1930 Today's Date: 03/08/2021   History of Present Illness Pt is an 87 year old man admitted on 03/07/21 with L side weakness. MRI + for small foci ischemia R pons and L frontal periventricular white matter. PMH. Alzheimers dementia, DM, HTN, stage 3 CKD, lung ca, prostate ca, multiple skin cancers, CAD, anxiety, macular degeneration.   Clinical Impression   Pt had recently started using a RW and having more difficulty with incontinence. He was independent in self care and he and his wife have a live-in caregiver to assist with IADL. Pt presence with impaired vision (macular degeneration) and cognition (likely baseline). He demonstrates L side weakness and inattention with poor sitting and standing balance. Pt needs +2 min to mod assist for mobility and min to total assist for ADL. Recommending inpatient rehab for intensive therapy prior to return home. Will follow acutely.      Recommendations for follow up therapy are one component of a multi-disciplinary discharge planning process, led by the attending physician.  Recommendations may be updated based on patient status, additional functional criteria and insurance authorization.   Follow Up Recommendations  Acute inpatient rehab (3hours/day)    Assistance Recommended at Discharge Frequent or constant Supervision/Assistance  Functional Status Assessment  Patient has had a recent decline in their functional status and demonstrates the ability to make significant improvements in function in a reasonable and predictable amount of time.  Equipment Recommendations  BSC/3in1;Wheelchair (measurements OT);Wheelchair cushion (measurements OT)    Recommendations for Other Services Rehab consult     Precautions / Restrictions Precautions Precautions: Fall Precaution Comments: son endorses a few sporadic falls Restrictions Weight Bearing Restrictions:  No      Mobility Bed Mobility Overal bed mobility: Needs Assistance Bed Mobility: Supine to Sit;Sit to Supine     Supine to sit: +2 for physical assistance;Min assist Sit to supine: Mod assist   General bed mobility comments: assist to guide LEs over EOB and to raise trunk, assisted LEs back into bed    Transfers Overall transfer level: Needs assistance Equipment used: 2 person hand held assist Transfers: Sit to/from Stand Sit to Stand: +2 physical assistance;Mod assist           General transfer comment: assist to rise and steady with L knee blocked      Balance Overall balance assessment: Needs assistance Sitting-balance support: Bilateral upper extremity supported Sitting balance-Leahy Scale: Poor   Postural control: Posterior lean Standing balance support: Bilateral upper extremity supported Standing balance-Leahy Scale: Poor                             ADL either performed or assessed with clinical judgement   ADL Overall ADL's : Needs assistance/impaired Eating/Feeding: Minimal assistance;Sitting   Grooming: Minimal assistance;Sitting   Upper Body Bathing: Moderate assistance;Sitting   Lower Body Bathing: +2 for physical assistance;Total assistance;Sit to/from stand   Upper Body Dressing : Moderate assistance;Sitting   Lower Body Dressing: +2 for physical assistance;Sit to/from stand;Bed level;Total assistance       Toileting- Clothing Manipulation and Hygiene: Total assistance;+2 for physical assistance;Bed level Toileting - Clothing Manipulation Details (indicate cue type and reason): changed soiled brief             Vision Baseline Vision/History: 6 Macular Degeneration Patient Visual Report: No change from baseline       Perception     Praxis  Pertinent Vitals/Pain Pain Assessment: No/denies pain     Hand Dominance Right   Extremity/Trunk Assessment Upper Extremity Assessment Upper Extremity Assessment: LUE  deficits/detail LUE Deficits / Details: 2+/3 strength LUE Sensation: decreased proprioception LUE Coordination: decreased fine motor;decreased gross motor   Lower Extremity Assessment Lower Extremity Assessment: Defer to PT evaluation   Cervical / Trunk Assessment Cervical / Trunk Assessment: Other exceptions Cervical / Trunk Exceptions: weakness, rotates trunk slightly toward the L in sitting   Communication Communication Communication: HOH   Cognition Arousal/Alertness: Awake/alert Behavior During Therapy: WFL for tasks assessed/performed Overall Cognitive Status: History of cognitive impairments - at baseline                                 General Comments: Dementia at baseline     General Comments       Exercises     Shoulder Instructions      Home Living Family/patient expects to be discharged to:: Private residence Living Arrangements: Spouse/significant other;Non-relatives/Friends Available Help at Discharge: Family;Personal care attendant;Available 24 hours/day Type of Home: House Home Access: Ramped entrance     Home Layout: One level     Bathroom Shower/Tub: Occupational psychologist: Handicapped height     Home Equipment: Conservation officer, nature (2 wheels);Hospital bed;Grab bars - tub/shower;Shower seat;Grab bars - toilet   Additional Comments: Has a live in caregiver      Prior Functioning/Environment Prior Level of Function : Independent/Modified Independent             Mobility Comments: Recently started using RW ADLs Comments: Caregiver helps with meal prep, IADLs.        OT Problem List: Decreased strength;Impaired balance (sitting and/or standing);Decreased coordination;Decreased cognition;Impaired vision/perception;Decreased knowledge of use of DME or AE;Impaired UE functional use      OT Treatment/Interventions: Self-care/ADL training;Neuromuscular education;DME and/or AE instruction;Therapeutic activities;Cognitive  remediation/compensation;Patient/family education;Balance training;Visual/perceptual remediation/compensation    OT Goals(Current goals can be found in the care plan section) Acute Rehab OT Goals OT Goal Formulation: With patient Time For Goal Achievement: 03/22/21 Potential to Achieve Goals: Good ADL Goals Pt Will Perform Eating: with modified independence;sitting Pt Will Perform Grooming: with modified independence;sitting Pt Will Perform Upper Body Bathing: with min assist;sitting Pt Will Perform Upper Body Dressing: with min assist;sitting Pt Will Transfer to Toilet: with mod assist;stand pivot transfer;bedside commode Pt/caregiver will Perform Home Exercise Program: Increased strength;Left upper extremity;With minimal assist Additional ADL Goal #1: Pt will demonstrate fair sitting balance at EOB in preparation for ADL.  OT Frequency: Min 3X/week   Barriers to D/C:            Co-evaluation PT/OT/SLP Co-Evaluation/Treatment: Yes Reason for Co-Treatment: For patient/therapist safety   OT goals addressed during session: ADL's and self-care      AM-PAC OT "6 Clicks" Daily Activity     Outcome Measure Help from another person eating meals?: A Little Help from another person taking care of personal grooming?: A Little Help from another person toileting, which includes using toliet, bedpan, or urinal?: Total Help from another person bathing (including washing, rinsing, drying)?: A Lot Help from another person to put on and taking off regular upper body clothing?: A Lot Help from another person to put on and taking off regular lower body clothing?: Total 6 Click Score: 12   End of Session Equipment Utilized During Treatment: Gait belt Nurse Communication: Other (comment);Mobility status (aware brief was changed and  IV alarming)  Activity Tolerance: Patient tolerated treatment well Patient left: in bed;with call bell/phone within reach;with family/visitor present  OT Visit  Diagnosis: Unsteadiness on feet (R26.81);Other abnormalities of gait and mobility (R26.89);Muscle weakness (generalized) (M62.81);Other symptoms and signs involving cognitive function;Hemiplegia and hemiparesis Hemiplegia - Right/Left: Left Hemiplegia - dominant/non-dominant: Non-Dominant Hemiplegia - caused by: Cerebral infarction                Time: 2536-6440 OT Time Calculation (min): 22 min Charges:  OT General Charges $OT Visit: 1 Visit OT Evaluation $OT Eval Moderate Complexity: 1 Mod  Nestor Lewandowsky, OTR/L Acute Rehabilitation Services Pager: [phone] Office: [phone]  Malka So 03/08/2021, 12:12 PM

## 2021-03-08 NOTE — ED Notes (Signed)
Admitting notified of pt's critical calcium

## 2021-03-08 NOTE — H&P (Signed)
History and Physical    PLEASE NOTE THAT DRAGON DICTATION SOFTWARE WAS USED IN THE CONSTRUCTION OF THIS NOTE.   [name] BPZ:[id number] DOB: 1931/02/12 DOA: 03/07/2021  PCP: Ethan Loffler, MD  Patient coming from: home   I have personally briefly reviewed patient's old medical records in [place]  Chief Complaint: Left-sided weakness  HPI: [name] is a 87 y.o. male with medical history significant for Alzheimer dementia, type 2 diabetes mellitus, essential hypertension, stage IIIb chronic kidney disease with baseline creatinine 1.5-1.9,  who is admitted to [facility] on 03/07/2021 with acute ischemic CVA after presenting from home to [facility] ED complaining of left-sided weakness.   History provided by the patient, patient's family, discussions with the EDP, and via chart review.  Patient's had seen the patient on 11/29, at which time he appeared to be in his baseline state of health, without any overt evidence of acute focal neurologic deficits.  At the saw the patient, on 03/04/2021, they noted that he was appearing to drag his left lower extremity, and subsequently noted his weakness in the upper and lower extremity on the left.  They also noted new onset left-sided facial droop.  There is (patient's family that the symptoms of be self-limited, however over the ensuing 3 to 4 days, there is reportedly no significant improvement in his left-sided weakness or left facial droop prompting the patient to ultimately be brought to [facility] emergency department this evening for further evaluation management thereof.  The patient denies any associated acute numbness, paresthesias, dizziness, vertigo, acute change in vision, or headache.  He also denies any recent chest pain, potation's, diaphoresis, and no recent trauma.   Medical history notable for type 2 diabetes mellitus, for which she is on glipizide in the absence of insulin.  He also has a history of essential  hypertension, managed via lifestyle modifications in the absence of any and hypertensive medications at home.  He is on a daily baby aspirin at home, but otherwise no blood thinners, including no formal anticoagulation.  Per chart review, he also has history of stage IIIb chronic kidney disease with baseline creatinine range of 1.5-1.9, with most recent prior serum creatinine data point noted to be 1.87 on 05/18/2020.  Not currently on a statin at home.  No known history of atrial fibrillation.  He is a former smoker, having completely quit smoking in the mid 1980s after smoking approximately 2 packs/day for over 40 years.    ED Course:  Vital signs in the ED were notable for the following:  Afebrile; heart rate 70-82; blood pressure 138/76-171/86; respiratory rate 20-22, oxygen saturation 93 to 96% on room air.  Labs were notable for the following: CMP notable for the following: Potassium 5.2, bicarbonate 25, BUN 20, creatinine 1.50, glucose 229.  Liver enzymes found to be within normal limits.  Serum ethanol level less than 10.  CBC notable for white cell count 7600, hemoglobin 13.1.  Urinalysis and urinary drug screen ordered, with results currently pending.  COVID-19/influenza PCR were checked in the ED today and found to be negative.  Imaging and additional notable ED work-up: EKG shows sinus rhythm with interventricular conduction delay, QRS 140, he heart rate 81, nonspecific T wave inversion in lead III, no evidence of ST changes, including no evidence of ST elevation.  Chest x-ray shows no evidence of acute cardiopulmonary process.  CT head shows no evidence of acute intracranial process, including no evidence of intracranial hemorrhage, while  demonstrating severe cerebral atrophy.  MRI brain shows small foci of acute ischemia within the ventral right pons and left frontal periventricular white matter, without associated hemorrhage or mass-effect.  The EDP discussed the patient's case with the  on-call neurologist, Dr. Rory Percy, who will formally consult, and has evaluated the patient in the ED this evening.  Dr.AroraRecommends admission to the hospital service for further evaluation and management of presenting acute ischemic stroke, with associated recommendations that include continuing home aspirin, MRA of the head, bilateral carotid Dopplers, echocardiogram.  Dr. Rory Percy are also confirms that there is not an indication for observance of permissive hypertension given that the patient's last known normal is greater than 48 hours ago.   While in the ED, the following were administered: Ativan 1 mg IV x1 in preparation for aforementioned MRI brain.     Review of Systems: As per HPI otherwise 10 point review of systems negative.   Past Medical History:  Diagnosis Date   Adenocarcinoma of left lung, stage 1 ([place]) 2001   T1N0 stage I  adenoca left lung resected 01/03/00    Alzheimer's disease ([place])    CAD (coronary artery disease) 2014   a. 09/2012 Cath: LM nl, LAD 50-60p, D1 60-70 m, D1 50ost, LCX nl, RCA min irregs, EF 55-65%.   Generalized anxiety disorder 01/08/2007   Qualifier: Diagnosis of  By: Diona Browner MD, [name]     History of colonic polyps 07/25/2011   Macular degeneration of both eyes 01/21/2014   Nonmelanoma skin cancer 07/25/2011   Multiple lesions excised face/nose   Pericarditis    a. 09/2012 with effusion and tamponade, s/p window.  b.  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 SCC IN SITU TX CX3   SCC (squamous cell carcinoma) 04/12/2005   RIGHT NECK SUP. SCC IN SITU TX EXC   SCC (squamous cell carcinoma) 04/16/2013   LEFT TEMPLE SCC IN SITU TX CX3 5FU   SCC (squamous cell carcinoma) 04/16/2013   BELOW LEFT EYE SCC IN SITU TX CX3 5FU   SCC (squamous cell carcinoma) 04/16/2013   BELOW RIGHT EYE SCC IN SITU TX CX3 5FU   SCC (squamous cell carcinoma) 08/04/2014   LEFT CHEEK SCC IN SITU TX CX3 5FU   SCC (squamous cell carcinoma) 11/27/2018   LEFT TEMPLE SCC IN SITU TX WITH BX   SCC (squamous cell carcinoma)    SCC (squamous cell carcinoma)    SCC (squamous cell carcinoma)    SCC (squamous cell carcinoma) Well Diff 09/13/2016   Tip of Nose SCC WELL DIFF TX (MOH's), and RIGHT INNER EYE SUP. SCC IN SITU TX TO WATCH   SCC (squamous cell carcinoma) Well Diff 05/30/2017   Right Cheekbone (Cx3,5FU) and Under  Left Eye (Cx3,5FU)   Squamous cell carcinoma in situ (SCCIS) 04/12/2005   Right Neck Inf (Cx3), Right Neck Sup (Exc), and Bulb of Nose (Cx3,Exc)   Squamous cell carcinoma in situ (SCCIS) 09/27/2005   Nose (Cx3,Exc)   Squamous cell carcinoma in situ (SCCIS) 02/18/2013   Below Left Eye (tx p bx)   Squamous cell carcinoma in situ (SCCIS) 04/16/2013   Left Temple (Cx3,5FU), Below Left Eye (Cx3,5FU), Below Right Eye (Cx3,5FU)   Squamous cell carcinoma in situ (SCCIS) 08/04/2014   Left Cheek (Cx3,5FU)   Squamous cell carcinoma in situ (SCCIS) 09/13/2016   Right Inner Eye Sup. 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   Type 2 diabetes mellitus with vascular  disease ([place]) 05/21/2007   Qualifier: Diagnosis of  By: Diona Browner MD, [name]     Type 2 DM with CKD stage 3 and hypertension ([place]) 07/16/2015   Unspecified essential hypertension     Past Surgical History:  Procedure Laterality Date   HERNIA REPAIR     LEFT HEART CATHETERIZATION WITH CORONARY ANGIOGRAM N/A 09/13/2012   Procedure: LEFT HEART CATHETERIZATION WITH CORONARY ANGIOGRAM;  Surgeon: Sherren Mocha, MD;  Location: [facility] CATH LAB;  Service: Cardiovascular;  Laterality: N/A;   LOBECTOMY  2001   upper left   PERICARDIAL TAP N/A 09/16/2012   Procedure: PERICARDIAL TAP;  Surgeon: Sinclair Grooms, MD;  Location: [facility] CATH LAB;  Service: Cardiovascular;  Laterality: N/A;   PROSTATECTOMY  2003   SUBXYPHOID PERICARDIAL WINDOW N/A 09/16/2012   Procedure: SUBXYPHOID PERICARDIAL WINDOW;  Surgeon: Rexene Alberts, MD;  Location: [place];  Service: Thoracic;  Laterality: N/A;   TONSILLECTOMY      Social History:  reports that he quit smoking about 36 years ago. His smoking use included cigarettes. He started smoking about 68 years ago. He has a 84.00 pack-year smoking history. He quit smokeless tobacco use about 34 years ago.  His smokeless tobacco use included chew. He reports that he does not drink alcohol and does not use drugs.   Allergies  Allergen Reactions   Sulfa Antibiotics Nausea Only    Family History  Problem Relation Age of Onset   Cancer Father        colon   Dementia Mother    Diabetes Mother    Cancer Brother        lung    Family history reviewed and not pertinent    Prior to Admission medications   Medication Sig Start Date End Date Taking? Authorizing Provider  aspirin EC 81 MG tablet Take 81 mg by mouth daily.    [provider]  diphenhydrAMINE (BENADRYL) 25 MG tablet Take 25 mg by mouth 2 (two) times daily.    [provider]  donepezil (ARICEPT) 10 MG tablet TAKE 1 TABLET BY MOUTH EVERYDAY AT BEDTIME 02/14/21   Copland, Spencer, MD  glipiZIDE  (GLUCOTROL) 5 MG tablet TAKE 1 TABLET BY MOUTH EVERY DAY BEFORE BREAKFAST 01/06/21   Copland, Frederico Hamman, MD  Multiple Vitamins-Minerals (ICAPS LUTEIN & ZEAXANTHIN PO) Take 1 capsule by mouth 2 (two) times daily. I - Caps    [provider]  Omega-3 Fatty Acids (  FISH OIL) 1000 MG CAPS Take 1,200 mg by mouth in the morning and at bedtime.    [provider]  omeprazole (PRILOSEC) 20 MG capsule Take 20 mg by mouth 2 (two) times daily before a meal.    [provider]  sertraline (ZOLOFT) 100 MG tablet TAKE 1 TABLET BY MOUTH EVERY DAY 01/06/21   Copland, Frederico Hamman, MD  tobramycin (TOBREX) 0.3 % ophthalmic solution PLEASE SEE ATTACHED FOR DETAILED DIRECTIONS 12/16/20   [provider]     Objective    Physical Exam: Vitals:   03/07/21 2315 03/07/21 2327 03/08/21 0036 03/08/21 0038  BP: (!) 158/79  (!) 164/133   Pulse: 74  82 81  Resp: (!) 22  (!) 26 (!) 26  Temp:      TempSrc:      SpO2: 95% 95% 94% 93%  Weight:      Height:        General: appears to be stated age; alert Skin: warm, dry, no rash Head:  AT/[place] Mouth:  Oral mucosa membranes appear moist, normal dentition Neck: supple; trachea midline Heart:  RRR; did not appreciate any M/R/G Lungs: CTAB, did not appreciate any wheezes, rales, or rhonchi Abdomen: + BS; soft, ND, NT Vascular: 2+ pedal pulses b/l; 2+ radial pulses b/l Extremities: no peripheral edema, no muscle wasting Neuro: 4/5 strength in upper and lower extremity on the left; 5/5 strength in; sensation intact in upper and lower extremities bilaterally; + facial droop noted; otherwise CN2-12 grossly intact; no evidence of slurred speech.    Labs on Admission: I have personally reviewed following labs and imaging studies  CBC: Recent Labs  Lab 03/07/21 1928  WBC 7.6  NEUTROABS 5.4  HGB 13.1  HCT 39.1  MCV 100.8*  PLT 585*   Basic Metabolic Panel: Recent Labs  Lab 03/07/21 1928  NA 138  K 5.2*  CL 104  CO2 25  GLUCOSE  229*  BUN 20  CREATININE 1.50*  CALCIUM 9.0   GFR: Estimated Creatinine Clearance: 34 mL/min (A) (by C-G formula based on SCr of 1.5 mg/dL (H)). Liver Function Tests: Recent Labs  Lab 03/07/21 1928  AST 11*  ALT 11  ALKPHOS 81  BILITOT 0.6  PROT 6.9  ALBUMIN 3.8   No results for input(s): LIPASE, AMYLASE in the last 168 hours. No results for input(s): AMMONIA in the last 168 hours. Coagulation Profile: Recent Labs  Lab 03/07/21 1928  INR 1.1   Cardiac Enzymes: No results for input(s): CKTOTAL, CKMB, CKMBINDEX, TROPONINI in the last 168 hours. BNP (last 3 results) No results for input(s): PROBNP in the last 8760 hours. HbA1C: No results for input(s): HGBA1C in the last 72 hours. CBG: No results for input(s): GLUCAP in the last 168 hours. Lipid Profile: No results for input(s): CHOL, HDL, LDLCALC, TRIG, CHOLHDL, LDLDIRECT in the last 72 hours. Thyroid Function Tests: No results for input(s): TSH, T4TOTAL, FREET4, T3FREE, THYROIDAB in the last 72 hours. Anemia Panel: No results for input(s): VITAMINB12, FOLATE, FERRITIN, TIBC, IRON, RETICCTPCT in the last 72 hours. Urine analysis:    Component Value Date/Time   COLORURINE AMBER (A) 05/18/2020 1618   APPEARANCEUR HAZY (A) 05/18/2020 1618   LABSPEC 1.026 05/18/2020 1618   PHURINE 5.0 05/18/2020 1618   GLUCOSEU NEGATIVE 05/18/2020 1618   HGBUR NEGATIVE 05/18/2020 1618   HGBUR trace-lysed 02/02/2010 1523   BILIRUBINUR NEGATIVE 05/18/2020 1618   KETONESUR NEGATIVE 05/18/2020 1618   PROTEINUR NEGATIVE 05/18/2020 1618   UROBILINOGEN 0.2 09/18/2012  1939   NITRITE NEGATIVE 05/18/2020 [place] 05/18/2020 1618    Radiological Exams on Admission: CT Head Wo Contrast  Result Date: 03/07/2021 CLINICAL DATA:  Altered mental status stroke suspected. EXAM: CT HEAD WITHOUT CONTRAST TECHNIQUE: Contiguous axial images were obtained from the base of the skull through the vertex without intravenous contrast.  COMPARISON:  No comparison imaging is available, reports from 2004 are noted and reports from 2001 as well. FINDINGS: Brain: No evidence of acute infarction, hemorrhage, hydrocephalus, extra-axial collection or mass lesion/mass effect. Severe cerebral atrophy. Atrophy reported on previous imaging as severe. Vascular: No hyperdense vessel or unexpected calcification. Skull: Normal. Negative for fracture or focal lesion. Sinuses/Orbits: Visualized paranasal sinuses and orbits without acute process. Other: None. IMPRESSION: No acute intracranial pathology. Severe cerebral atrophy. Electronically Signed   By: Zetta Bills M.D.   On: 03/07/2021 19:57   MR BRAIN WO CONTRAST  Result Date: 03/08/2021 CLINICAL DATA:  Left-sided weakness with facial droop EXAM: MRI HEAD WITHOUT CONTRAST TECHNIQUE: Multiplanar, multiecho pulse sequences of the brain and surrounding structures were obtained without intravenous contrast. COMPARISON:  None. FINDINGS: Brain: There is a small focus of abnormal diffusion restriction the ventral right pons. There is also a punctate focus of diffusion restriction in the left frontal periventricular white matter. No acute or chronic hemorrhage. There is multifocal hyperintense T2-weighted signal within the white matter. Diffuse, severe atrophy, worst in the frontal and parietal lobes. Bilateral subdural hygromas. The midline structures are normal. Vascular: Major flow voids are preserved. Skull and upper cervical spine: Normal calvarium and skull base. Visualized upper cervical spine and soft tissues are normal. Sinuses/Orbits:No paranasal sinus fluid levels or advanced mucosal thickening. No mastoid or middle ear effusion. Normal orbits. IMPRESSION: 1. Small foci of acute ischemia within the ventral right pons and left frontal periventricular white matter. No hemorrhage or mass effect. 2. Diffuse, severe atrophy, worst in the frontal and parietal lobes. 3. Bilateral subdural hygromas.  Electronically Signed   By: Ulyses Jarred M.D.   On: 03/08/2021 00:30   DG Chest Portable 1 View  Result Date: 03/07/2021 CLINICAL DATA:  Shortness of breath. Left-sided weakness. Stroke presentation. EXAM: PORTABLE CHEST 1 VIEW COMPARISON:  05/18/2020 FINDINGS: The right lung is clear. There is probably been lobectomy on the left. There is chronic pleural and parenchymal scarring on the left. No sign of active infiltrate, mass, effusion or collapse. No acute bone finding. IMPRESSION: Status post lobectomy on the left with chronic scarring. No active process evident. Electronically Signed   By: Nelson Chimes M.D.   On: 03/07/2021 20:10     EKG: Independently reviewed, with result as described above.    Assessment/Plan   Principal Problem:   Acute ischemic stroke [facility]) Active Problems:   Type 2 diabetes mellitus with vascular disease (HCC)   Hyperkalemia   Hypomagnesemia   GERD (gastroesophageal reflux disease)    #) Acute ischemic CVA: In the setting of presenting left hemiparesis with left facial droop and last known normal occurring sometime between 03/02/2021 and 03/04/2021, MRI brain demonstrates small foci of acute ischemia within the ventral right pons and left frontal periventricular white matter, without evidence of hemorrhage or mass-effect.   the patient's case was discussed with the on-call neurologist, Dr. Rory Percy, who will formally consult, and has evaluated the patient in the ED this evening.  Dr.Arora recommends admission to the hospital service for further evaluation and management of presenting acute ischemic stroke, with associated recommendations that include continuing  home aspirin, MRA of the head, bilateral carotid Dopplers, echocardiogram.  Dr. Rory Percy also confirms that there is not an indication for observance of permissive hypertension given that the patient's last known normal is greater than 48 hours ago.  Not a candidate for tPA/thrombectomy as the patient presents  outside of the window of eligibility for such.  Of note, the patient reportedly possesses multiple modifiable CVA risk factors including a history of type 2 diabetes mellitus, hypertension.  He also is a former smoker, with greater than 80-pack-year history before completely quitting smoking in the 1980s.  No known history of hyperlipidemia, OSA, or paroxysmal atrial fibrillation.  EKG in the ED today suggestive of sinus rhythm.   Plan: Nursing bedside swallow evaluation x 1 now, and will not initiate oral medications or diet until the patient has passed this. Head of the bed at 30 degrees. Neuro checks per protocol. VS per protocol. Monitor on telemetry, including monitoring for atrial fibrillation as modifiable risk factor.  MRI head without contrast as well as bilateral carotid ultrasound, per neurology consult.  Echocardiogram ordered for the morning.  Check lipid panel/A1c.  PT/OT/ST consults have been ordered.  Continue home daily baby aspirin, per neurology recommendation.       #) Hyperkalemia: Presenting labs reflect mildly elevated serum potassium of 5.2, without evidence of associated EKG findings.  Renal function appears to be at baseline within the confines of stage IIb chronic kidney disease with baseline creatinine range of 1.5-1.9.  No evidence of overt metabolic acidosis, and no medications that directly contribute to hyperkalemia.  Differential includes the possibility of extracellular potassium shift due to relative hyperglycemia in the setting of type 2 diabetes mellitus, due to physiologic stress stemming from presenting acute ischemic CVA as well as missed dose of glipizide.  Will initiate sliding scale insulin, repeat serum potassium level, with potential for pharmacologic intervention if ensuing serum potassium level demonstrates no interval improvement.  Plan: Sliding scale insulin, as above.  Monitor on symmetry.  Repeat serum potassium level.  Add on serum magnesium level.   Monitor strict I's and O's.      #) Hypomagnesemia: Serum magnesium 1.4.  Plan: Magnesium sulfate 2 g IV over 2 hours x 1 dose now.  Repeat serum magnesium level in the morning.    #) Stage IIIb chronic kidney disease: Per chart review, it appears that patient's baseline creatinine range is 1.5-1.9, with most recent prior serum creatinine data point noted to be 1.87 in February 2022.  Presenting serum creatinine consistent with this baseline range.  Plan: Monitor strict I's and O's and daily weights.  Tempt avoid nephrotoxic agents.  Repeat BMP in the morning.       #) Generalized anxiety disorder: On Zoloft as an outpatient.  Of note, EKG demonstrates no evidence of QTC prolongation.  Plan: Continue home Zoloft.       #) Type 2 Diabetes Mellitus: documented history of such. Home insulin regimen: None. Home oral hypoglycemic agents: Glipizide. presenting blood sugar 229.   Plan: accuchecks QAC and HS with low-dose sliding scale insulin. hold home oral hypoglycemic agents, as above.  Check hemoglobin A1c as component of work-up for presenting acute ischemic CVA, as above.         #) GERD: documented h/o such; on omeprazole as outpatient.   Plan: continue home PPI       DVT prophylaxis: SCD's   Code Status: Full code Disposition Plan: Per Rounding Team Consults called: ; Dr. Malen Gauze of neurology  consulted, as further detailed above;  Admission status: Observation; med telemetry   PLEASE NOTE THAT DRAGON DICTATION SOFTWARE WAS USED IN THE CONSTRUCTION OF THIS NOTE.   [place] DO [facility]  From [place]   03/08/2021, 1:20 AM

## 2021-03-08 NOTE — Telephone Encounter (Signed)
Per chart review tab pt was admitted to [facility]. Sending note to Dr Lorelei Pont.

## 2021-03-08 NOTE — Progress Notes (Signed)
03/08/21 1328  PT Visit Information  Last PT Received On 03/08/21  Assistance Needed +2  PT/OT/SLP Co-Evaluation/Treatment Yes  Reason for Co-Treatment For patient/therapist safety;To address functional/ADL transfers  PT goals addressed during session Mobility/safety with mobility;Balance  History of Present Illness Pt is an 87 year old man admitted on 03/07/21 with L side weakness. MRI + for small foci ischemia R pons and L frontal periventricular white matter. PMH. Alzheimers dementia, DM, HTN, stage 3 CKD, lung ca, prostate ca, multiple skin cancers, CAD, anxiety, macular degeneration.  Precautions  Precautions Fall  Precaution Comments son endorses a few sporadic falls  Restrictions  Weight Bearing Restrictions No  Home Living  Family/patient expects to be discharged to: Private residence  Living Arrangements Spouse/significant other;Non-relatives/Friends  Available Help at Discharge Family;Personal care attendant;Available 24 hours/day  Type of [place] One level  Bathroom Shower/Tub Walk-in shower  Leonardo (2 wheels);Hospital bed;Grab bars - tub/shower;Shower seat;Grab bars - toilet  Additional Comments Has a live in caregiver  Prior Function  Prior Level of Function  Independent/Modified Independent  Mobility Comments Recently started using RW  Communication  Communication HOH  Pain Assessment  Pain Assessment No/denies pain  Cognition  Arousal/Alertness Awake/alert  Behavior During Therapy WFL for tasks assessed/performed  Overall Cognitive Status History of cognitive impairments - at baseline  General Comments Dementia at baseline  Upper Extremity Assessment  Upper Extremity Assessment Defer to OT evaluation  Lower Extremity Assessment  Lower Extremity Assessment LLE deficits/detail  LLE Deficits / Details 2+/5 throughout. Buckling noted during functional mobility  tasks.  Cervical / Trunk Assessment  Cervical / Trunk Assessment Other exceptions  Cervical / Trunk Exceptions weakness, rotates trunk slightly toward the L in sitting  Bed Mobility  Overal bed mobility Needs Assistance  Bed Mobility Supine to Sit;Sit to Supine  Supine to sit +2 for physical assistance;Min assist  Sit to supine Mod assist;+2 for physical assistance  General bed mobility comments assist to guide LEs over EOB and to raise trunk, assisted LEs back into bed  Transfers  Overall transfer level Needs assistance  Equipment used 2 person hand held assist  Transfers Sit to/from Stand  Sit to Stand +2 physical assistance;Mod assist  General transfer comment assist to rise and steady with L knee blocked. Took side steps at EOB and required mod A +2 and manual blocking of L knee.  Balance  Overall balance assessment Needs assistance  Sitting-balance support Bilateral upper extremity supported  Sitting balance-Leahy Scale Poor  Postural control Posterior lean  Standing balance support Bilateral upper extremity supported  Standing balance-Leahy Scale Poor  General Comments  General comments (skin integrity, edema, etc.) Pt's son present throughout session  PT - End of Session  Equipment Utilized During Treatment Gait belt  Activity Tolerance Patient tolerated treatment well  Patient left in bed;with call bell/phone within reach;with family/visitor present (on stretcher in ED)  Nurse Communication Mobility status  PT Assessment  PT Recommendation/Assessment Patient needs continued PT services  PT Visit Diagnosis Unsteadiness on feet (R26.81);Muscle weakness (generalized) (M62.81);Difficulty in walking, not elsewhere classified (R26.2)  PT Problem List Decreased strength;Decreased balance;Decreased mobility;Decreased knowledge of use of DME;Decreased cognition;Decreased safety awareness;Decreased knowledge of precautions;Impaired sensation  PT Plan  PT Frequency (ACUTE ONLY) Min  4X/week  PT Treatment/Interventions (ACUTE ONLY) Gait training;DME instruction;Stair training;Functional mobility training;Therapeutic activities;Therapeutic exercise;Balance training;Patient/family education  AM-PAC PT "6 Clicks" Mobility Outcome Measure (Version  2)  Help needed turning from your back to your side while in a flat bed without using bedrails? 2  Help needed moving from lying on your back to sitting on the side of a flat bed without using bedrails? 2  Help needed moving to and from a bed to a chair (including a wheelchair)? 1  Help needed standing up from a chair using your arms (e.g., wheelchair or bedside chair)? 1  Help needed to walk in hospital room? 1  Help needed climbing 3-5 steps with a railing?  1  6 Click Score 8  Consider Recommendation of Discharge To: CIR/SNF/LTACH  Progressive Mobility  What is the highest level of mobility based on the progressive mobility assessment? Level 3 (Stands with assist) - Balance while standing  and cannot march in place  Mobility Sit up in bed/chair position for meals  PT Recommendation  Recommendations for Other Services Rehab consult  Follow Up Recommendations Acute inpatient rehab (3hours/day)  Assistance recommended at discharge Frequent or constant Supervision/Assistance  Functional Status Assessment Patient has had a recent decline in their functional status and demonstrates the ability to make significant improvements in function in a reasonable and predictable amount of time.  PT equipment Wheelchair cushion (measurements PT);Wheelchair (measurements PT)  Individuals Consulted  Consulted and Agree with Results and Recommendations Patient;Family member/caregiver  Family Member Consulted son  Acute Rehab PT Goals  Patient Stated Goal to get better  PT Goal Formulation With patient  Time For Goal Achievement 03/22/21  Potential to Achieve Goals Good  PT Time Calculation  PT Start Time (ACUTE ONLY) 0950  PT Stop Time (ACUTE  ONLY) 1015  PT Time Calculation (min) (ACUTE ONLY) 25 min  PT General Charges  $$ ACUTE PT VISIT 1 Visit  PT Evaluation  $PT Eval Moderate Complexity 1 Mod  Written Expression  Dominant Hand Right   Pt admitted secondary to problem above with deficits below. Pt requiring min A +2 for bed mobility and mod A +2 to stand and take steps at EOB. Pt with L knee buckling and required manual blocking. Prior to admission, pt was independent with mobility tasks. Recommending acute inpatient rehab to increase independence and safety. Will continue to follow acutely.   Reuel Derby, PT, DPT  Acute Rehabilitation Services  Pager: [phone] Office: [phone]

## 2021-03-08 NOTE — Progress Notes (Addendum)
STROKE TEAM PROGRESS NOTE   INTERVAL HISTORY Patient is seen in his room with his son at the bedside.  Last week, he began to notice some left-sided weakness and slurring of speech.  His family brought him to the emergency room for evaluation and he was found to have a right pontine stroke. MRI scan of the brain shows large right ventral pontine and a tiny left periventricular lacunar infarct.  MR angiogram of the brain shows no significant large vessel stenosis or occlusion but very tortuous basilar artery.  Echocardiogram and carotid ultrasound pending.  LDL cholesterol is 83 mg percent and hemoglobin A1c is elevated at 8.3. Vitals:   03/08/21 0615 03/08/21 0715 03/08/21 0915 03/08/21 1215  BP: (!) 155/83 (!) 153/71 (!) 171/84 (!) 164/84  Pulse: 75 69 76 74  Resp: (!) 23 (!) 21 (!) 21 19  Temp:      TempSrc:      SpO2: 96% 94% 97% 97%  Weight:      Height:       CBC:  Recent Labs  Lab 03/07/21 1928 03/08/21 0553  WBC 7.6 6.2  NEUTROABS 5.4  --   HGB 13.1 11.7*  HCT 39.1 35.6*  MCV 100.8* 100.3*  PLT 121* 893*   Basic Metabolic Panel:  Recent Labs  Lab 03/08/21 0424 03/08/21 1037  NA 139 135  K 2.8* 4.6  CL 119* 105  CO2 17* 23  GLUCOSE 145* 278*  BUN 14 17  CREATININE 0.90 1.30*  CALCIUM 5.6* 8.7*  MG 1.4*  --    Lipid Panel:  Recent Labs  Lab 03/08/21 0424  CHOL 122  TRIG 109  HDL 17*  CHOLHDL 7.2  VLDL 22  LDLCALC 83   HgbA1c:  Recent Labs  Lab 03/08/21 0553  HGBA1C 8.3*   Urine Drug Screen:  Recent Labs  Lab 03/08/21 1036  LABOPIA NONE DETECTED  COCAINSCRNUR NONE DETECTED  LABBENZ POSITIVE*  AMPHETMU NONE DETECTED  THCU NONE DETECTED  LABBARB NONE DETECTED    Alcohol Level  Recent Labs  Lab 03/07/21 1950  ETH <10    IMAGING past 24 hours CT Head Wo Contrast  Result Date: 03/07/2021 CLINICAL DATA:  Altered mental status stroke suspected. EXAM: CT HEAD WITHOUT CONTRAST TECHNIQUE: Contiguous axial images were obtained from the base of  the skull through the vertex without intravenous contrast. COMPARISON:  No comparison imaging is available, reports from 2004 are noted and reports from 2001 as well. FINDINGS: Brain: No evidence of acute infarction, hemorrhage, hydrocephalus, extra-axial collection or mass lesion/mass effect. Severe cerebral atrophy. Atrophy reported on previous imaging as severe. Vascular: No hyperdense vessel or unexpected calcification. Skull: Normal. Negative for fracture or focal lesion. Sinuses/Orbits: Visualized paranasal sinuses and orbits without acute process. Other: None. IMPRESSION: No acute intracranial pathology. Severe cerebral atrophy. Electronically Signed   By: Zetta Bills M.D.   On: 03/07/2021 19:57   MR ANGIO HEAD WO CONTRAST  Result Date: 03/08/2021 CLINICAL DATA:  Follow-up stroke. Left-sided weakness and facial droop. Pint teen and left frontal infarctions. EXAM: MRA HEAD WITHOUT CONTRAST TECHNIQUE: Angiographic images of the Circle of Willis were acquired using MRA technique without intravenous contrast. COMPARISON:  MRI earlier same day FINDINGS: Anterior circulation: Both internal carotid arteries are widely patent through the skull base and siphon regions. The anterior and middle cerebral vessels are patent without proximal stenosis, aneurysm or vascular malformation. Posterior circulation: Both vertebral arteries are patent through the foramen magnum to the basilar. No V4 segment  stenosis. The basilar artery is tortuous but widely patent. Both superior cerebellar arteries show flow. Both posterior cerebral arteries show flow. The left posterior cerebral artery receives most of it supply from the anterior circulation with a small contribution from the basilar tip. The right posterior cerebral artery arises from the basilar tip and shows a severe stenosis 1.5 cm beyond its origin. Anatomic variants: None other significant. Other: None. IMPRESSION: No intracranial large vessel occlusion. Both  vertebral arteries are patent to the basilar. No basilar stenosis. Severe stenosis of the right posterior cerebral artery 1.5 cm beyond its origin, but patent beyond that. Left PCA receives most of it supply from the anterior circulation but does have a small contribution from the basilar tip. No significant anterior circulation finding. Electronically Signed   By: Nelson Chimes M.D.   On: 03/08/2021 08:05   MR BRAIN WO CONTRAST  Result Date: 03/08/2021 CLINICAL DATA:  Left-sided weakness with facial droop EXAM: MRI HEAD WITHOUT CONTRAST TECHNIQUE: Multiplanar, multiecho pulse sequences of the brain and surrounding structures were obtained without intravenous contrast. COMPARISON:  None. FINDINGS: Brain: There is a small focus of abnormal diffusion restriction the ventral right pons. There is also a punctate focus of diffusion restriction in the left frontal periventricular white matter. No acute or chronic hemorrhage. There is multifocal hyperintense T2-weighted signal within the white matter. Diffuse, severe atrophy, worst in the frontal and parietal lobes. Bilateral subdural hygromas. The midline structures are normal. Vascular: Major flow voids are preserved. Skull and upper cervical spine: Normal calvarium and skull base. Visualized upper cervical spine and soft tissues are normal. Sinuses/Orbits:No paranasal sinus fluid levels or advanced mucosal thickening. No mastoid or middle ear effusion. Normal orbits. IMPRESSION: 1. Small foci of acute ischemia within the ventral right pons and left frontal periventricular white matter. No hemorrhage or mass effect. 2. Diffuse, severe atrophy, worst in the frontal and parietal lobes. 3. Bilateral subdural hygromas. Electronically Signed   By: Ulyses Jarred M.D.   On: 03/08/2021 00:30   DG Chest Portable 1 View  Result Date: 03/07/2021 CLINICAL DATA:  Shortness of breath. Left-sided weakness. Stroke presentation. EXAM: PORTABLE CHEST 1 VIEW COMPARISON:  05/18/2020  FINDINGS: The right lung is clear. There is probably been lobectomy on the left. There is chronic pleural and parenchymal scarring on the left. No sign of active infiltrate, mass, effusion or collapse. No acute bone finding. IMPRESSION: Status post lobectomy on the left with chronic scarring. No active process evident. Electronically Signed   By: Nelson Chimes M.D.   On: 03/07/2021 20:10   DG Swallowing Func-Speech Pathology  Result Date: 03/08/2021 Table formatting from the original result was not included. Objective Swallowing Evaluation: Type of Study: MBS-Modified Barium Swallow Study  Patient Details Name: [name] MRN: [id number] Date of Birth: [birth year] Today's Date: 03/08/2021 Time: SLP Start Time (ACUTE ONLY): 52 -SLP Stop Time (ACUTE ONLY): 1320 SLP Time Calculation (min) (ACUTE ONLY): 20 min Past Medical History: Past Medical History: Diagnosis Date  Adenocarcinoma of left lung, stage 1 ([place]) 2001  T1N0 stage I  adenoca left lung resected 01/03/00   Alzheimer's disease ([place])   CAD (coronary artery disease) 2014  a. 09/2012 Cath: LM nl, LAD 50-60p, D1 60-70 m, D1 50ost, LCX nl, RCA min irregs, EF 55-65%.  Generalized anxiety disorder 01/08/2007  Qualifier: Diagnosis of  By: Diona Browner MD, [name]    History of colonic polyps 07/25/2011  Macular degeneration of both eyes 01/21/2014  Nonmelanoma skin cancer 07/25/2011  Multiple lesions excised face/nose  Pericarditis   a. 09/2012 with effusion and tamponade, s/p window.  b.  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 SCC IN SITU TX CX3  SCC (squamous cell carcinoma) 04/12/2005  RIGHT NECK SUP. SCC IN SITU TX EXC  SCC (squamous cell carcinoma) 04/16/2013  LEFT TEMPLE SCC IN SITU TX CX3 5FU  SCC (squamous cell carcinoma) 04/16/2013  BELOW LEFT EYE SCC IN SITU TX CX3 5FU  SCC (squamous cell carcinoma) 04/16/2013  BELOW RIGHT EYE SCC IN SITU TX CX3 5FU  SCC (squamous cell carcinoma) 08/04/2014  LEFT CHEEK SCC IN SITU TX CX3 5FU  SCC (squamous cell carcinoma) 11/27/2018  LEFT TEMPLE SCC IN SITU TX WITH BX  SCC (squamous cell carcinoma)   SCC (squamous cell carcinoma)   SCC (squamous cell carcinoma)   SCC (squamous cell carcinoma) Well Diff 09/13/2016  Tip of Nose SCC WELL DIFF TX (MOH's), and RIGHT INNER EYE SUP. 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/21/2007  Qualifier: Diagnosis of   By: Diona Browner MD, [name]    Type 2 DM with CKD stage 3 and hypertension ([place]) 07/16/2015  Unspecified essential hypertension  Past Surgical History: Past Surgical History: Procedure Laterality Date  HERNIA REPAIR    LEFT HEART CATHETERIZATION WITH CORONARY ANGIOGRAM N/A 09/13/2012  Procedure: LEFT HEART CATHETERIZATION WITH CORONARY ANGIOGRAM;  Surgeon: Sherren Mocha, MD;  Location: [facility] CATH LAB;  Service: Cardiovascular;  Laterality: N/A;  LOBECTOMY  2001  upper left  PERICARDIAL TAP N/A 09/16/2012  Procedure: PERICARDIAL TAP;  Surgeon: Sinclair Grooms, MD;  Location: [facility] CATH LAB;  Service: Cardiovascular;  Laterality: N/A;  PROSTATECTOMY  2003  SUBXYPHOID PERICARDIAL WINDOW N/A 09/16/2012  Procedure: SUBXYPHOID PERICARDIAL WINDOW;  Surgeon: Rexene Alberts, MD;  Location: [facility];  Service: Thoracic;  Laterality: N/A;  TONSILLECTOMY   HPI: Patient is an 87 y.o. male with PMH: Alzheimer's dementia, DM-2, essential HTN, stage IIIb chronic kidney disease who was admitted to [facility] on 12/4 with acute ischemic CVA after presenting from home to [facility] ED c/o left sided weakness. CXR negative for any active processes, CT head negative for acute intracranial abnormality but MRI brain showing small foci of acute ischemia within ventral right pons and left forntal periventricular white matter; no hemorrhage or mass; diffuse, severe atrophy worst in frontal and parietal lobes.  Subjective: pleasant, alert, sitting in chair in radiology suite  Recommendations for follow up therapy are one component of a multi-disciplinary discharge planning process, led by the attending physician.  Recommendations may be updated based on patient status, additional functional criteria and insurance authorization. Assessment / Plan / Recommendation Clinical Impressions 03/08/2021 Clinical Impression Patient presents with a mild oropharyngeal dysphagia as per this MBS. He exhibited mildly delayed mastication of regular solids and reduced anterior to posterior  transit of puree solids, barium tablet and regular solids. Swallow was initiated at level of vallecular sinus with puree solids, nectar thick liquids and regular solids and initiated at level of almost at pyriform sinus with thin liquids. Silent, trace aspiration occured during the swallow with each small sip of thin liquids (PAS 8) secondary to reduced airway protection. No aspiration or penetration observed with nectar thick liquids even with straw sips and taking barium tablet with nectar thick liquid sip. No significant amount of pharyngeal residuals remained post initial swallows. Appearance of prominent cricopharyngeal bar as well as questionable cervical osteophytes ( no radiologist present to confirm) resulted in mildly reduced transit but no retention of barium. No esophageal backflow observed and esophageal sweep did not reveal anything significant. SLP is recommending Dys 3 solids, nectar thick liquids at this time. SLP Visit Diagnosis Dysphagia, unspecified (R13.10) Attention and concentration deficit following -- Frontal lobe and executive function deficit following -- Impact on safety and function Mild aspiration risk;Moderate aspiration risk   Treatment Recommendations 03/08/2021 Treatment Recommendations Therapy as outlined in treatment plan below   Prognosis 03/08/2021 Prognosis for Safe Diet Advancement Good Barriers to Reach Goals Cognitive deficits Barriers/Prognosis Comment h/o Alzheimer's dementia Diet Recommendations 03/08/2021 SLP Diet Recommendations Dysphagia 3 (Mech soft) solids;Nectar thick liquid Liquid Administration via Cup;Straw Medication Administration Whole meds with liquid Compensations Minimize environmental distractions;Slow rate;Small sips/bites Postural Changes --   Other Recommendations 03/08/2021 Recommended Consults -- Oral Care Recommendations Oral care BID Other Recommendations Order thickener from pharmacy;Prohibited food (jello, ice cream, thin soups);Remove water  pitcher;Clarify dietary restrictions Follow Up Recommendations Acute inpatient rehab (3hours/day) Assistance recommended at discharge Frequent or constant Supervision/Assistance Functional Status Assessment Patient has had a recent decline in their functional status and demonstrates the ability to make significant improvements in function in a reasonable and predictable amount of time. Frequency and Duration  03/08/2021 Speech Therapy Frequency (ACUTE ONLY) min 2x/week Treatment Duration 1 week   Oral Phase 03/08/2021 Oral Phase Impaired Oral - Pudding Teaspoon -- Oral - Pudding Cup -- Oral - Honey Teaspoon -- Oral - Honey Cup -- Oral - Nectar Teaspoon -- Oral - Nectar Cup WFL Oral - Nectar Straw WFL Oral - Thin Teaspoon -- Oral - Thin Cup WFL Oral - Thin Straw -- Oral - Puree Delayed oral transit;Reduced posterior propulsion Oral - Mech Soft -- Oral - Regular Impaired mastication;Delayed oral transit;Reduced posterior propulsion Oral - Multi-Consistency -- Oral - Pill Delayed oral transit;Decreased bolus cohesion;Reduced posterior  propulsion Oral Phase - Comment --  Pharyngeal Phase 03/08/2021 Pharyngeal Phase Impaired Pharyngeal- Pudding Teaspoon -- Pharyngeal -- Pharyngeal- Pudding Cup -- Pharyngeal -- Pharyngeal- Honey Teaspoon -- Pharyngeal -- Pharyngeal- Honey Cup -- Pharyngeal -- Pharyngeal- Nectar Teaspoon -- Pharyngeal -- Pharyngeal- Nectar Cup Delayed swallow initiation-vallecula Pharyngeal -- Pharyngeal- Nectar Straw Delayed swallow initiation-vallecula Pharyngeal -- Pharyngeal- Thin Teaspoon -- Pharyngeal -- Pharyngeal- Thin Cup Delayed swallow initiation-vallecula;Delayed swallow initiation-pyriform sinuses;Reduced airway/laryngeal closure;Penetration/Aspiration during swallow;Trace aspiration Pharyngeal Material enters airway, passes BELOW cords without attempt by patient to eject out (silent aspiration) Pharyngeal- Thin Straw -- Pharyngeal -- Pharyngeal- Puree Delayed swallow initiation-vallecula  Pharyngeal -- Pharyngeal- Mechanical Soft -- Pharyngeal -- Pharyngeal- Regular Delayed swallow initiation-vallecula Pharyngeal -- Pharyngeal- Multi-consistency -- Pharyngeal -- Pharyngeal- Pill WFL Pharyngeal -- Pharyngeal Comment --  Cervical Esophageal Phase  03/08/2021 Cervical Esophageal Phase Impaired Pudding Teaspoon -- Pudding Cup -- Honey Teaspoon -- Honey Cup -- Nectar Teaspoon -- Nectar Cup Prominent cricopharyngeal segment Nectar Straw Prominent cricopharyngeal segment Thin Teaspoon -- Thin Cup Prominent cricopharyngeal segment Thin Straw -- Puree Prominent cricopharyngeal segment Mechanical Soft -- Regular Prominent cricopharyngeal segment Multi-consistency -- Pill Prominent cricopharyngeal segment Cervical Esophageal Comment -- Sonia Baller, MA, CCC-SLP Speech Therapy                     ECHOCARDIOGRAM COMPLETE  Result Date: 03/08/2021    ECHOCARDIOGRAM REPORT   Patient Name:   [name] Date of Exam: 03/08/2021 Medical Rec #:  [id number]        Height:       69.0 in Accession #:    [id number]       Weight:       182.0 lb Date of Birth:  [birth year]        BSA:          1.985 m Patient Age:    24 years         BP:           171/84 mmHg Patient Gender: M                HR:           72 bpm. Exam Location:  Inpatient Procedure: 2D Echo, Cardiac Doppler and Color Doppler Indications:    stroke  History:        Patient has no prior history of Echocardiogram examinations.                 Stroke; Risk Factors:Diabetes and Hypertension.  Sonographer:    Melissa Morford RDCS (AE, PE) Referring Phys: [id number] Rhetta Mura  Sonographer Comments: Technically difficult study due to poor echo windows. IMPRESSIONS  1. Left ventricular ejection fraction, by estimation, is 50 to 55%. The left ventricle has low normal function. The left ventricle has no regional wall motion abnormalities. There is moderate asymmetric left ventricular hypertrophy of the basal-septal segment. Left ventricular diastolic  parameters are consistent with Grade I diastolic dysfunction (impaired relaxation).  2. Right ventricular systolic function is mildly reduced. The right ventricular size is normal.  3. The mitral valve is normal in structure. No evidence of mitral valve regurgitation. No evidence of mitral stenosis.  4. The aortic valve is tricuspid. Aortic valve regurgitation is trivial. No aortic stenosis is present.  5. The inferior vena cava is normal in size with greater than 50% respiratory variability, suggesting right atrial pressure of 3 mmHg. FINDINGS  Left Ventricle: Left ventricular ejection fraction, by estimation, is  50 to 55%. The left ventricle has low normal function. The left ventricle has no regional wall motion abnormalities. The left ventricular internal cavity size was normal in size. There is moderate asymmetric left ventricular hypertrophy of the basal-septal segment. Left ventricular diastolic parameters are consistent with Grade I diastolic dysfunction (impaired relaxation). Right Ventricle: The right ventricular size is normal. No increase in right ventricular wall thickness. Right ventricular systolic function is mildly reduced. The tricuspid regurgitant velocity is 1.68 m/s, and with an assumed right atrial pressure of 3 mmHg, the estimated right ventricular systolic pressure is 37.1 mmHg. Left Atrium: Left atrial size was normal in size. Right Atrium: Right atrial size was normal in size. Pericardium: There is no evidence of pericardial effusion. Mitral Valve: The mitral valve is normal in structure. No evidence of mitral valve regurgitation. No evidence of mitral valve stenosis. Tricuspid Valve: The tricuspid valve is normal in structure. Tricuspid valve regurgitation is trivial. Aortic Valve: The aortic valve is tricuspid. Aortic valve regurgitation is trivial. No aortic stenosis is present. Pulmonic Valve: The pulmonic valve was grossly normal. Pulmonic valve regurgitation is trivial. Aorta: The  aortic root and ascending aorta are structurally normal, with no evidence of dilitation. Venous: The inferior vena cava is normal in size with greater than 50% respiratory variability, suggesting right atrial pressure of 3 mmHg. IAS/Shunts: The interatrial septum was not well visualized.  LEFT VENTRICLE PLAX 2D LVIDd:         4.60 cm   Diastology LVIDs:         3.40 cm   LV e' medial:    6.20 cm/s LV PW:         1.30 cm   LV E/e' medial:  5.0 LV IVS:        1.30 cm   LV e' lateral:   10.90 cm/s LVOT diam:     2.30 cm   LV E/e' lateral: 2.9 LV SV:         58 LV SV Index:   29 LVOT Area:     4.15 cm  RIGHT VENTRICLE RV S prime:     9.79 cm/s TAPSE (M-mode): 1.3 cm LEFT ATRIUM             Index        RIGHT ATRIUM           Index LA diam:        3.80 cm 1.91 cm/m   RA Area:     14.10 cm LA Vol (A2C):   46.3 ml 23.33 ml/m  RA Volume:   37.90 ml  19.09 ml/m LA Vol (A4C):   18.9 ml 9.52 ml/m LA Biplane Vol: 30.7 ml 15.47 ml/m  AORTIC VALVE LVOT Vmax:   94.80 cm/s LVOT Vmean:  61.600 cm/s LVOT VTI:    0.140 m  AORTA Ao Root diam: 3.80 cm Ao Asc diam:  3.30 cm MITRAL VALVE               TRICUSPID VALVE MV Area (PHT): 5.27 cm    TR Peak grad:   11.3 mmHg MV Decel Time: 144 msec    TR Vmax:        168.00 cm/s MV E velocity: 31.13 cm/s MV A velocity: 91.30 cm/s  SHUNTS MV E/A ratio:  0.34        Systemic VTI:  0.14 m  Systemic Diam: 2.30 cm Oswaldo Milian MD Electronically signed by Oswaldo Milian MD Signature Date/Time: 03/08/2021/1:30:23 PM    Final    VAS US CAROTID  Result Date: 03/08/2021 Carotid Arterial Duplex Study Patient Name:  [name]  Date of Exam:   03/08/2021 Medical Rec #: [id number]         Accession #:    [id number] Date of Birth: [birth year]         Patient Gender: M Patient Age:   38 years Exam Location:  [facility] Procedure:      VAS US CAROTID Referring Phys: Babs Bertin  --------------------------------------------------------------------------------  Indications:       CVA. Risk Factors:      Hypertension, hyperlipidemia, Diabetes, past history of                    smoking, coronary artery disease. Other Factors:     CKD3. Comparison Study:  No previous exams Performing Technologist: Jody Hill RVT, RDMS  Examination Guidelines: A complete evaluation includes B-mode imaging, spectral Doppler, color Doppler, and power Doppler as needed of all accessible portions of each vessel. Bilateral testing is considered an integral part of a complete examination. Limited examinations for reoccurring indications may be performed as noted.  Right Carotid Findings: +----------+--------+--------+--------+------------------+------------------+           PSV cm/sEDV cm/sStenosisPlaque DescriptionComments           +----------+--------+--------+--------+------------------+------------------+ CCA Prox  62      10                                intimal thickening +----------+--------+--------+--------+------------------+------------------+ CCA Distal63      10                                                   +----------+--------+--------+--------+------------------+------------------+ ICA Prox  73      12              heterogenous                         +----------+--------+--------+--------+------------------+------------------+ ICA Distal61      11                                                   +----------+--------+--------+--------+------------------+------------------+ ECA       78      0                                                    +----------+--------+--------+--------+------------------+------------------+ +----------+--------+-------+----------------+-------------------+           PSV cm/sEDV cmsDescribe        Arm Pressure (mmHG) +----------+--------+-------+----------------+-------------------+ [id number]             Multiphasic, WNL                    +----------+--------+-------+----------------+-------------------+ +---------+--------+--+--------+-+---------+ VertebralPSV cm/s44EDV cm/s7Antegrade +---------+--------+--+--------+-+---------+  Left Carotid Findings: +----------+--------+--------+--------+------------------+------------------+  PSV cm/sEDV cm/sStenosisPlaque DescriptionComments           +----------+--------+--------+--------+------------------+------------------+ CCA Prox  89      13                                intimal thickening +----------+--------+--------+--------+------------------+------------------+ CCA Distal68      9                                                    +----------+--------+--------+--------+------------------+------------------+ ICA Prox  70      14              calcific                             +----------+--------+--------+--------+------------------+------------------+ ICA Distal76      16                                                   +----------+--------+--------+--------+------------------+------------------+ ECA       62      0                                 shadowing          +----------+--------+--------+--------+------------------+------------------+ +----------+--------+--------+----------------+-------------------+           PSV cm/sEDV cm/sDescribe        Arm Pressure (mmHG) +----------+--------+--------+----------------+-------------------+ [id number]              Multiphasic, WNL                    +----------+--------+--------+----------------+-------------------+ +---------+--------+--+--------+-+---------+ VertebralPSV cm/s46EDV cm/s9Antegrade +---------+--------+--+--------+-+---------+   Summary: Right Carotid: The extracranial vessels were near-normal with only minimal wall                thickening or plaque. Left Carotid: The extracranial vessels were near-normal with only  minimal wall               thickening or plaque. Vertebrals:  Bilateral vertebral arteries demonstrate antegrade flow. Subclavians: Normal flow hemodynamics were seen in bilateral subclavian              arteries. *See table(s) above for measurements and observations.  Electronically signed by Antony Contras MD on 03/08/2021 at 1:10:33 PM.    Final     PHYSICAL EXAM General:  Alert, well-developed, well-nourished elderly Caucasian male patient in no acute distress   NEURO:  Mental Status: AA&Ox3  Speech/Language: speech is without dysarthria or aphasia. Difficult to assess as patient is hard of hearing. Comprehension intact.  Cranial Nerves:  II: PERRL. Visual fields full.  III, IV, VI: EOMI. Eyelids elevate symmetrically.  V: Sensation is intact to light touch and symmetrical to face.  VII: left sided facial droop VIII: hearing intact to voice. IX, X: Phonation is normal.  XII: left tongue deviation Motor: 5/5 strength to RUE and RLE, 3/5 strength in LUE, 4/5 strength in LLE, more difficulty moving last two fingers of left hand Sensation- Intact to light touch bilaterally. Extinction absent to light touch to  DSS.  Coordination: FTN intact bilaterally, drift in LUE and LLE  Gait- deferred   ASSESSMENT/PLAN [name] is a 87 y.o. male with history of lung adenocarcinoma, Alzheimer's disease, CAD, multiple skin CA, HTN and CKD 3 presenting with left-sided weakness that had been present for a few days. HE was brought to the hospital by his family and found to have a right pontine stroke and small left frontal periventricular white matter stroke.  Stroke: Right ventral pontine and tiny left periventricular lacunar infarcts likely from small vessel disease CT head No acute abnormality.  Atrophy.  MRI  Right pontine stroke and small stroke in right periventricular white matter, diffuse, severe atrophy MRA  No intracranial LVO, Severe stenosis of right PCA Carotid doppler No  significant thickening or plaque 2D Echo EF 79-15%, grade 1 diastolic dysfunction, interatrial septum not well visualized LDL 83 HgbA1c 8.3 VTE prophylaxis - lovenox    Diet   DIET DYS 3 Room service appropriate? Yes; Fluid consistency: Nectar Thick   aspirin 81 mg daily prior to admission, now on aspirin 81 mg daily and clopidogrel 75 mg daily. Continue aspirin and Plavix for 3 weeks then aspirin alone Therapy recommendations:  CIR Disposition:  pending  Hypertension Home meds:  none Stable Keep SBP <180 Long-term BP goal normotensive  Hyperlipidemia Home meds:  none LDL 83, goal < 70 Add atorvastatin 20 mg daily  High intensity statin deferred due to advanced age Continue statin at discharge  Diabetes type II Uncontrolled Home meds:  Glipzide 5 mg daily, metformin 500 mg daily HgbA1c 8.3, goal < 7.0 CBGs Recent Labs    03/08/21 0805 03/08/21 1205  GLUCAP 214* 250*   Diabetes coordinator consult SSI  Other Stroke Risk Factors Advanced Age >/= 48  Former cigarette smoker   Other Active Problems Alzheimer's disease Continue home Aricept CKD 3 Avoid contrast when possible Renally dose medications as appropriate  Hospital day # [place] , MSN, AGACNP-BC [facility] See Amion for schedule and pager information 03/08/2021 3:01 PM   STROKE MD NOTE : I have personally obtained history,examined this patient, reviewed notes, independently viewed imaging studies, participated in medical decision making and plan of care.ROS completed by me personally and pertinent positives fully documented  I have made any additions or clarifications directly to the above note. Agree with note above.  Patient presented with 2 to 3-day history of dysarthria left-sided weakness secondary to ventral pontine and Left periventricular white matter lacunar infarcts from small vessel disease.  Recommend aspirin Plavix for 3 weeks followed by aspirin alone and  aggressive risk factor modification.  Continue ongoing stroke work-up.  Mobilize out of bed.  Therapy consults.  Will likely need to go to inpatient rehab after insurance approval.  Long discussion with patient and son at bedside and answered questions.  Discussed with Dr. [place] Pink.  Greater than 50% time during this 35-minute visit was spent on counseling and coordination of care about stroke and liponecrosis  Antony Contras, [facility] Pager: [phone] 03/08/2021 3:09 PM    To contact Stroke Continuity provider, please refer to [email or web address] After hours, contact General Neurology

## 2021-03-08 NOTE — Telephone Encounter (Signed)
[facility] Night - Client TELEPHONE ADVICE RECORD [facility] Patient Name: [name] Gender: Unknown DOB: 08/30/1930 Age: 87 Y 14 M 20 D Return Phone Number: [phone] (Primary), [phone] (Secondary) Address[place]  [address] Client [facility] Night - Client Client Site [place] Provider [name] - MD Contact Type Call Who Is Calling Patient / Member / Family / Caregiver Call Type Triage / Clinical Caller Name [name] Relationship To Patient Spouse Return Phone Number [phone] (Primary) Chief Complaint Blood Sugar High Reason for Call Symptomatic / Request for Corn states her husband blood pressure 190/96 and blood sugar is 442 Translation No Nurse Assessment Nurse: Glean Salvo, RN, Hayden Rasmussen Date/Time (Eastern Time): 03/07/2021 4:21:05 PM Confirm and document reason for call. If symptomatic, describe symptoms. ---Caller states her husband blood pressure 190/96 and blood sugar is 442. Symptoms started right now. Has an appt to go to Doctor Copland. Takes Oral Glipizide. No symptoms right now. He states he is weak and his right arm is weak Does the patient have any new or worsening symptoms? ---Yes Will a triage be completed? ---Yes Related visit to physician within the last 2 weeks? ---No Does the PT have any chronic conditions? (i.e. diabetes, asthma, this includes High risk factors for pregnancy, etc.) ---No Is this a behavioral health or substance abuse call? ---No Guidelines Guideline Title Affirmed Question Affirmed Notes Nurse Date/Time (Eastern Time) Blood Pressure - High [1] Weakness of the face, arm or leg on one side of the body AND [2] new-onset Walker-Foster, RN, Ebone 03/07/2021 4:23:39 PM Disp. Time Eilene Ghazi Time) Disposition Final User 03/07/2021 4:29:13 PM 911 Outcome  Documentation Walker-Foster, RN, Ebone Reason: Line busy PLEASE NOTE: All timestamps contained within this report are represented as [place] Standard Time. CONFIDENTIALTY NOTICE: This fax transmission is intended only for the addressee. It contains information that is legally privileged, confidential or otherwise protected from use or disclosure. If you are not the intended recipient, you are strictly prohibited from reviewing, disclosing, copying using or disseminating any of this information or taking any action in reliance on or regarding this information. If you have received this fax in error, please notify us immediately by telephone so that we can arrange for its return to [place]. Phone: [phone], Toll-Free: [phone], Fax: [phone] Page: 2 of 2 Call Id: [id number] 03/07/2021 4:24:56 PM Call EMS 911 Now Yes Glean Salvo, RN, Hayden Rasmussen Caller Disagree/Comply Comply Caller Understands Yes PreDisposition InappropriateToAsk Care Advice Given Per Guideline CALL EMS 911 NOW: * Immediate medical attention is needed. You need to hang up and call 911 (or an ambulance). CARE ADVICE given per High Blood Pressure (Adult) guideline. Comments User: Ellison Carwin, RN Date/Time Eilene Ghazi Time): 03/07/2021 4:23:45 PM Cance

## 2021-03-08 NOTE — Progress Notes (Signed)
Carotid duplex has been completed.  Results can be found under chart review under CV PROC. 03/08/2021 11:14 AM Shadman Tozzi RVT, RDMS

## 2021-03-08 NOTE — Evaluation (Signed)
Clinical/Bedside Swallow Evaluation Patient Details  Name: [name] MRN: [id number] Date of Birth: 11-09-1930  Today's Date: 03/08/2021 Time: SLP Start Time (ACUTE ONLY): 6834 SLP Stop Time (ACUTE ONLY): 1962 SLP Time Calculation (min) (ACUTE ONLY): 30 min  Past Medical History:  Past Medical History:  Diagnosis Date   Adenocarcinoma of left lung, stage 1 ([place]) 2001   T1N0 stage I  adenoca left lung resected 01/03/00    Alzheimer's disease ([place])    CAD (coronary artery disease) 2014   a. 09/2012 Cath: LM nl, LAD 50-60p, D1 60-70 m, D1 50ost, LCX nl, RCA min irregs, EF 55-65%.   Generalized anxiety disorder 01/08/2007   Qualifier: Diagnosis of  By: Diona Browner MD, [name]     History of colonic polyps 07/25/2011   Macular degeneration of both eyes 01/21/2014   Nonmelanoma skin cancer 07/25/2011   Multiple lesions excised face/nose   Pericarditis    a. 09/2012 with effusion and tamponade, s/p window.  b.  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 SCC IN SITU TX CX3   SCC (squamous cell carcinoma) 04/12/2005   RIGHT NECK SUP. SCC IN SITU TX EXC   SCC (squamous cell carcinoma) 04/16/2013   LEFT TEMPLE SCC IN SITU TX CX3 5FU   SCC (squamous cell carcinoma) 04/16/2013   BELOW LEFT EYE SCC IN SITU TX CX3 5FU   SCC (squamous cell carcinoma) 04/16/2013   BELOW RIGHT EYE SCC IN SITU  TX CX3 5FU   SCC (squamous cell carcinoma) 08/04/2014   LEFT CHEEK SCC IN SITU TX CX3 5FU   SCC (squamous cell carcinoma) 11/27/2018   LEFT TEMPLE SCC IN SITU TX WITH BX   SCC (squamous cell carcinoma)    SCC (squamous cell carcinoma)    SCC (squamous cell carcinoma)    SCC (squamous cell carcinoma) Well Diff 09/13/2016   Tip of Nose SCC WELL DIFF TX (MOH's), and RIGHT INNER EYE SUP. SCC IN SITU TX TO WATCH   SCC (squamous cell carcinoma) Well Diff 05/30/2017   Right Cheekbone (Cx3,5FU) and Under Left Eye (Cx3,5FU)   Squamous cell carcinoma in situ (SCCIS) 04/12/2005   Right Neck Inf (Cx3), Right Neck Sup (Exc), and Bulb of Nose (Cx3,Exc)   Squamous cell carcinoma in situ (SCCIS) 09/27/2005   Nose (Cx3,Exc)   Squamous cell carcinoma in situ (SCCIS) 02/18/2013   Below Left Eye (tx p bx)   Squamous cell carcinoma in situ (SCCIS) 04/16/2013   Left Temple (Cx3,5FU), Below Left Eye (Cx3,5FU), Below Right Eye (Cx3,5FU)   Squamous cell carcinoma in situ (SCCIS) 08/04/2014   Left Cheek (Cx3,5FU)   Squamous cell carcinoma in situ (SCCIS) 09/13/2016   Right Inner Eye Sup. 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 10/25/2006   Right Elbow (Cx3,5FU)   Squamous cell carcinoma of skin 09/27/2005   NOSE SCC IN SITU TC CX3 5FU   Tobacco abuse, 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/08/2013   Type 2 diabetes mellitus with vascular disease ([place]) 05/21/2007   Qualifier: Diagnosis of  By: Diona Browner MD, [name]     Type 2 DM with CKD stage 3 and hypertension ([place]) 07/16/2015   Unspecified essential hypertension    Past Surgical History:  Past Surgical History:  Procedure Laterality Date   HERNIA REPAIR     LEFT HEART CATHETERIZATION WITH CORONARY ANGIOGRAM N/A 09/13/2012   Procedure: LEFT HEART CATHETERIZATION WITH CORONARY ANGIOGRAM;  Surgeon: Sherren Mocha, MD;  Location: [facility] CATH LAB;   Service: Cardiovascular;  Laterality: N/A;   LOBECTOMY  2001   upper left   PERICARDIAL TAP N/A 09/16/2012   Procedure: PERICARDIAL TAP;  Surgeon: Sinclair Grooms, MD;  Location: [facility] CATH LAB;  Service: Cardiovascular;  Laterality: N/A;   PROSTATECTOMY  2003   SUBXYPHOID PERICARDIAL WINDOW N/A 09/16/2012   Procedure: SUBXYPHOID PERICARDIAL WINDOW;  Surgeon: Rexene Alberts, MD;  Location: [facility] OR;  Service: Thoracic;  Laterality: N/A;   TONSILLECTOMY     HPI:  Patient is an 87 y.o. male with PMH: Alzheimer's dementia, DM-2, essential HTN, stage IIIb chronic kidney disease who was admitted to [facility] on 12/4 with acute ischemic CVA after presenting from home to [facility] ED c/o left sided weakness. CXR negative for any active processes, CT head negative for acute intracranial abnormality but MRI brain showing small foci of acute ischemia within ventral right pons and left forntal periventricular white matter; no hemorrhage or mass; diffuse, severe atrophy worst in frontal and parietal lobes.    Assessment / Plan / Recommendation  Clinical Impression  Patient presents with clinical s/s of dysphagia as per this bedside swallow evaluation. Patient also c/o recent difficulty with swallowing liquids but denying difficulty with solids. He also self-reported h/o having his "esophagus stretched". Mild amount of delayed throat clearing observed during PO intake, however no change in vocal quality (mildly hoarse, dry prior to PO's). He exhibited prolonged mastication and trace to min residuals in left side buccal cavity which he was able to clear with cued lingual sweep. He did exhibit expiratory wheezing however this appeared due to physical activity of pushing up and moving in bed. SLP is recommending to initiate Dys 3 solids, thin liquids diet but proceed with MBS to r/o aspiration or pharyngeal retention of solids/liquids. SLP Visit Diagnosis: Dysphagia, unspecified (R13.10)    Aspiration Risk  Mild aspiration risk     Diet Recommendation Dysphagia 3 (Mech soft);Thin liquid   Liquid Administration via: Straw;Cup Medication Administration: Whole meds with puree Supervision: Patient able to self feed;Full supervision/cueing for compensatory strategies Compensations: Minimize environmental distractions;Slow rate;Small sips/bites Postural Changes: Seated upright at 90 degrees    Other  Recommendations Oral Care Recommendations: Oral care BID;Staff/trained caregiver to provide oral care    Recommendations for follow up therapy are one component of a multi-disciplinary discharge planning process, led by the attending physician.  Recommendations may be updated based on patient status, additional functional criteria and insurance authorization.  Follow up Recommendations Acute inpatient rehab (3hours/day)      Assistance Recommended at Discharge Frequent or constant Supervision/Assistance  Functional Status Assessment Patient has had a recent decline  in their functional status and demonstrates the ability to make significant improvements in function in a reasonable and predictable amount of time.  Frequency and Duration min 2x/week  1 week       Prognosis Prognosis for Safe Diet Advancement: Good Barriers to Reach Goals: Cognitive deficits Barriers/Prognosis Comment: h/o Alzheimer's dementia      Swallow Study   General Date of Onset: 03/07/21 HPI: Patient is an 87 y.o. male with PMH: Alzheimer's dementia, DM-2, essential HTN, stage IIIb chronic kidney disease who was admitted to [facility] on 12/4 with acute ischemic CVA after presenting from home to [facility] ED c/o left sided weakness. CXR negative for any active processes, CT head negative for acute intracranial abnormality but MRI brain showing small foci of acute ischemia within ventral right pons and left forntal periventricular white matter; no hemorrhage or mass; diffuse, severe atrophy worst in frontal and parietal lobes. Type of Study: Bedside Swallow  Evaluation Previous Swallow Assessment: none found Diet Prior to this Study: NPO Temperature Spikes Noted: No Respiratory Status: Room air History of Recent Intubation: No Behavior/Cognition: Alert;Pleasant mood;Cooperative Oral Cavity Assessment: Within Functional Limits Oral Care Completed by SLP: Yes Oral Cavity - Dentition: Dentures, bottom;Dentures, top Vision: Functional for self-feeding Self-Feeding Abilities: Needs set up;Able to feed self Patient Positioning: Upright in bed Baseline Vocal Quality: Hoarse Volitional Cough: Strong Volitional Swallow: Able to elicit    Oral/Motor/Sensory Function Overall Oral Motor/Sensory Function: Moderate impairment Facial ROM: Reduced left;Suspected CN VII (facial) dysfunction Facial Symmetry: Abnormal symmetry left;Suspected CN VII (facial) dysfunction Facial Strength: Reduced left;Suspected CN VII (facial) dysfunction Lingual ROM: Within Functional Limits Lingual Symmetry: Abnormal symmetry left;Suspected CN XII (hypoglossal) dysfunction Velum: Within Functional Limits Mandible: Within Functional Limits   Ice Chips     Thin Liquid Thin Liquid: Impaired Pharyngeal  Phase Impairments: Suspected delayed Swallow;Other (comments) Other Comments: mild amount of delayed throat clearing    Nectar Thick     Honey Thick     Puree Puree: Not tested   Solid     Solid: Impaired Oral Phase Impairments: Impaired mastication;Reduced lingual movement/coordination Pharyngeal Phase Impairments: Suspected delayed Swallow      Sonia Baller, MA, CCC-SLP Speech Therapy

## 2021-03-08 NOTE — ED Notes (Signed)
Breakfast orders placed 

## 2021-03-08 NOTE — Progress Notes (Signed)
  Inpatient Rehab Admissions Coordinator :  Per therapy recommendations, patient was screened for CIR candidacy by Danne Baxter RN MSN.  At this time patient appears to be a potential candidate for CIR and moving to inpt status from observation. I will place a rehab consult per protocol for full assessment. Please call me with any questions.  Danne Baxter RN MSN Admissions Coordinator [phone]

## 2021-03-08 NOTE — Consult Note (Signed)
Neurology Consultation  Reason for Consult: left sided weakness Referring Physician: Dr. Betsey Holiday  CC: left sided weakness  History is obtained from:chart, patient  HPI: [name] is a 87 y.o. male PMH of lung adenocarcinoma, AD, CAD, multiple skin ca, DM, HTN, CKD3, presented to the emergency room for evaluation of left-sided weakness. Last known well somewhere between Tuesday and Thursday of last week (03/02/2021 to 03/04/2021) when he started noticing some left-sided weakness.  The family notices that he is dragging his feet while walking.  His speech was also slurred and he noticed some left-sided facial weakness as well.  Brought into the emergency room for evaluation.  Noncontrasted head CT with extensive atrophy, bilateral chronic appearing subdural hygromas on my review with no acute blood.  MRI of the brain performed that showed a brainstem stroke as described below. Neurological consultation for further work-up. Patient also reports some dysphagia and wheezing at this time.    LKW: Multiple days ago tpa given?: no, outside the window Premorbid modified Rankin scale (mRS): 2  ROS: Full ROS was performed and is negative except as noted in the HPI.  Past Medical History:  Diagnosis Date   Adenocarcinoma of left lung, stage 1 ([place]) 2001   T1N0 stage I  adenoca left lung resected 01/03/00    Alzheimer's disease ([place])    CAD (coronary artery disease) 2014   a. 09/2012 Cath: LM nl, LAD 50-60p, D1 60-70 m, D1 50ost, LCX nl, RCA min irregs, EF 55-65%.   Generalized anxiety disorder 01/08/2007   Qualifier: Diagnosis of  By: Diona Browner MD, [name]     History of colonic polyps 07/25/2011   Macular degeneration of both eyes 01/21/2014   Nonmelanoma skin cancer 07/25/2011   Multiple lesions excised face/nose   Pericarditis    a. 09/2012 with effusion and tamponade, s/p window.  b.  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 SCC IN SITU TX CX3   SCC (squamous cell carcinoma) 04/12/2005   RIGHT NECK SUP. SCC IN SITU TX EXC   SCC (squamous cell carcinoma) 04/16/2013   LEFT TEMPLE SCC IN SITU TX CX3 5FU   SCC (squamous cell carcinoma) 04/16/2013   BELOW LEFT EYE SCC IN SITU TX CX3 5FU   SCC (squamous cell carcinoma) 04/16/2013   BELOW RIGHT EYE SCC IN SITU TX CX3 5FU   SCC (squamous cell carcinoma) 08/04/2014   LEFT CHEEK SCC IN SITU TX CX3 5FU   SCC (squamous cell carcinoma) 11/27/2018   LEFT TEMPLE SCC IN SITU TX WITH BX   SCC (squamous cell carcinoma)    SCC (squamous cell carcinoma)    SCC (squamous cell carcinoma)    SCC (squamous cell carcinoma) Well Diff 09/13/2016  Tip of Nose SCC WELL DIFF TX (MOH's), and RIGHT INNER EYE SUP. SCC IN SITU TX TO WATCH   SCC (squamous cell carcinoma) Well Diff 05/30/2017   Right Cheekbone (Cx3,5FU) and Under Left Eye (Cx3,5FU)   Squamous cell carcinoma in situ (SCCIS) 04/12/2005   Right Neck Inf (Cx3), Right Neck Sup (Exc), and Bulb of Nose (Cx3,Exc)   Squamous cell carcinoma in situ (SCCIS) 09/27/2005   Nose (Cx3,Exc)   Squamous cell carcinoma in situ (SCCIS) 02/18/2013   Below Left Eye (tx p bx)   Squamous cell carcinoma in situ (SCCIS) 04/16/2013   Left Temple (Cx3,5FU), Below Left Eye (Cx3,5FU), Below Right Eye (Cx3,5FU)   Squamous cell carcinoma in situ (SCCIS) 08/04/2014   Left Cheek (Cx3,5FU)   Squamous cell  carcinoma in situ (SCCIS) 09/13/2016   Right Inner Eye Sup. 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   Type 2 diabetes mellitus with vascular disease ([place]) 05/21/2007   Qualifier: Diagnosis of  By: Diona Browner MD, [name]     Type 2 DM with CKD stage 3 and hypertension ([place]) 07/16/2015   Unspecified essential hypertension         Family History  Problem Relation Age of Onset   Cancer Father        colon   Dementia Mother    Diabetes Mother    Cancer Brother        lung     Social History:   reports that he quit smoking about 36 years ago. His smoking use included cigarettes. He started smoking about 68 years ago. He has a 84.00 pack-year smoking history. He quit smokeless tobacco use about 34 years ago.  His smokeless tobacco use included chew. He reports that he does not drink alcohol and does not use drugs.  Medications No current facility-administered medications for this encounter.  Current Outpatient Medications:    aspirin EC 81 MG tablet, Take 81 mg by mouth daily., Disp: , Rfl:    diphenhydrAMINE (BENADRYL) 25 MG tablet, Take 25 mg by mouth 2 (two) times daily., Disp: , Rfl:    donepezil (ARICEPT) 10 MG tablet, TAKE 1 TABLET BY MOUTH EVERYDAY AT BEDTIME, Disp: 90 tablet, Rfl: 0   glipiZIDE (GLUCOTROL) 5 MG tablet, TAKE 1 TABLET BY MOUTH EVERY DAY BEFORE BREAKFAST, Disp: 90 tablet, Rfl: 3   Multiple Vitamins-Minerals (ICAPS LUTEIN & ZEAXANTHIN PO), Take 1 capsule by mouth 2 (two) times daily. I - Caps, Disp: , Rfl:    Omega-3 Fatty Acids (FISH OIL) 1000 MG CAPS, Take 1,200 mg by mouth in the morning and at bedtime., Disp: , Rfl:    omeprazole (PRILOSEC) 20 MG capsule, Take 20 mg by mouth 2  (two) times daily before a meal., Disp: , Rfl:    sertraline (ZOLOFT) 100 MG tablet, TAKE 1 TABLET BY MOUTH EVERY DAY, Disp: 90 tablet, Rfl: 3   tobramycin (TOBREX) 0.3 % ophthalmic solution, PLEASE SEE ATTACHED FOR DETAILED DIRECTIONS, Disp: , Rfl:    Exam: Current vital signs: BP (!) 164/133   Pulse 81   Temp 98.4 F (36.9 C) (Oral)   Resp (!) 26   Ht 5\' 9"  (1.753 m)  Wt 82.6 kg   SpO2 93%   BMI 26.88 kg/m  Vital signs in last 24 hours: Temp:  [98.4 F (36.9 C)] 98.4 F (36.9 C) (12/04 1917) Pulse Rate:  [74-82] 81 (12/05 0038) Resp:  [20-26] 26 (12/05 0038) BP: (158-191)/(77-133) 164/133 (12/05 0036) SpO2:  [93 %-96 %] 93 % (12/05 0038) Weight:  [82.6 kg] 82.6 kg (12/04 1801) General: Awake alert in no distress HEENT: Normocephalic/atraumatic Lungs: Clear Cardiovascular: Regular rate rhythm Abdomen nondistended nontender Extremities warm well perfused Neurological exam Awake alert oriented to time place and person Speech is mildly dysarthric No evidence of aphasia Cranial nerves: Pupils equal round react light, extraocular movements appear unhindered, visual fields are full, face asymmetric-left lower facial weakness at rest and on smile, extremely hard of hearing in both ears-could not really communicate with me until he got both his hearing aids and.  Tongue and palate midline. Motor examination: Left upper extremity drift with 4/5 strength, left lower extremity barely 3/5 and drifts to bed within a second of trying to lift it up the bed.  Right side full strength. Sensation intact light touch Coordination with no dysmetria on the right-difficult to perform on the left. NIH stroke scale  1a Level of Conscious.: 0 1b LOC Questions: 0 1c LOC Commands: 0 2 Best Gaze: 0 3 Visual: 0 4 Facial Palsy: 1 5a Motor Arm - left: 1 5b Motor Arm - Right: 0 6a Motor Leg - Left: 2 6b Motor Leg - Right: 0 7 Limb Ataxia: 0 8 Sensory: 0 9 Best Language: 0 10 Dysarthria: 1 11  Extinct. and Inatten.:  TOTAL: 5   Labs I have reviewed labs in epic and the results pertinent to this consultation are:   CBC    Component Value Date/Time   WBC 7.6 03/07/2021 1928   RBC 3.88 (L) 03/07/2021 1928   HGB 13.1 03/07/2021 1928   HGB 13.7 07/30/2015 0954   HGB 12.9 (L) 07/16/2013 1059   HCT 39.1 03/07/2021 1928   HCT 41.2 07/30/2015 0954   HCT 39.6 07/16/2013 1059   PLT 121 (L) 03/07/2021 1928   PLT 137 (L) 07/30/2015 0954   PLT 142 07/16/2013 1059   MCV 100.8 (H) 03/07/2021 1928   MCV 99 (H) 07/30/2015 0954   MCV 95.4 07/16/2013 1059   MCH 33.8 03/07/2021 1928   MCHC 33.5 03/07/2021 1928   RDW 12.2 03/07/2021 1928   RDW 12.7 07/30/2015 0954   RDW 12.6 07/16/2013 1059   LYMPHSABS 0.6 (L) 03/07/2021 1928   LYMPHSABS 1.1 07/30/2015 0954   LYMPHSABS 0.8 (L) 07/16/2013 1059   MONOABS 1.4 (H) 03/07/2021 1928   MONOABS 0.7 07/16/2013 1059   EOSABS 0.0 03/07/2021 1928   EOSABS 0.1 07/30/2015 0954   BASOSABS 0.0 03/07/2021 1928   BASOSABS 0.0 07/30/2015 0954   BASOSABS 0.0 07/16/2013 1059    CMP     Component Value Date/Time   NA 138 03/07/2021 1928   NA 143 07/30/2015 0957   K 5.2 (H) 03/07/2021 1928   K 5.3 No visable hemolysis (H) 07/30/2015 0957   CL 104 03/07/2021 1928   CL 103 07/20/2012 1046   CO2 25 03/07/2021 1928   CO2 28 07/30/2015 0957   GLUCOSE 229 (H) 03/07/2021 1928   GLUCOSE 231 (H) 07/30/2015 0957   GLUCOSE 215 (H) 07/20/2012 1046   BUN 20 03/07/2021 1928   BUN 14.4 07/30/2015 0957   CREATININE 1.50 (H) 03/07/2021 1928   CREATININE 1.5 (H) 07/30/2015 0957  CALCIUM 9.0 03/07/2021 1928   CALCIUM 9.5 07/30/2015 0957   PROT 6.9 03/07/2021 1928   PROT 6.8 07/30/2015 0957   ALBUMIN 3.8 03/07/2021 1928   ALBUMIN 3.9 07/30/2015 0957   AST 11 (L) 03/07/2021 1928   AST 10 07/30/2015 0957   ALT 11 03/07/2021 1928   ALT 11 07/30/2015 0957   ALKPHOS 81 03/07/2021 1928   ALKPHOS 88 07/30/2015 0957   BILITOT 0.6 03/07/2021 1928   BILITOT  0.43 07/30/2015 0957   GFRNONAA 45 (L) 03/07/2021 1928   GFRAA 58 (L) 06/25/2018 2149      Imaging I have reviewed the images obtained:  CT-head-no acute changes.  Extensive cortical atrophy versus subdural hygromas bilaterally without acute blood.  MRI examination of the brain-bilateral subdural hygromas.  Small foci of acute ischemia in the ventral right pons and left frontal periventricular white matter.  No hemorrhage or mass-effect.  Diffuse severe atrophy worst in the frontal and parietal lobes bilaterally.  Assessment: 87 year old with above past medical history with greater than 2 days of left-sided weakness, difficulty walking, left facial droop and slurred speech noted to have right ventral pontine infarct along with a questionable small focus of restricted diffusion in the left frontal periventricular white matter. Etiology likely concomitant small vessel disease.  Given the age, cardiac monitoring should be carefully looked at for any evidence of atrial fibrillation as the strokes could also very well be cardioembolic in nature.  Impression: Acute ischemic strokes   Recommendations: -Admit to hospitalist -Frequent neurochecks -Telemetry -Continue aspirin for now -MR angio head and carotid Dopplers-would avoid CTA given his deranged renal function -2D echo -A1c -Lipid panel -No need for permissive hypertension-symptoms have been at least going on for 3 to 4 days. -Bedside swallow screen, if fails will need formal swallow evaluation -PT -OT -Speech therapy -Might need long-term cardiac monitoring as an outpatient-we will defer to the stroke team to make a decision after all the testing is made available. Plan discussed with Sammy Petrucelli, PA-C in the emergency room.   -- Amie Portland, MD Neurologist [facility] Pager: [phone]

## 2021-03-09 ENCOUNTER — Encounter (HOSPITAL_COMMUNITY): Payer: Self-pay | Admitting: Internal Medicine

## 2021-03-09 DIAGNOSIS — N1832 Chronic kidney disease, stage 3b: Secondary | ICD-10-CM

## 2021-03-09 DIAGNOSIS — N179 Acute kidney failure, unspecified: Secondary | ICD-10-CM

## 2021-03-09 LAB — CBC
HCT: 34.9 % — ABNORMAL LOW (ref 39.0–52.0)
Hemoglobin: 11.7 g/dL — ABNORMAL LOW (ref 13.0–17.0)
MCH: 33.5 pg (ref 26.0–34.0)
MCHC: 33.5 g/dL (ref 30.0–36.0)
MCV: 100 fL (ref 80.0–100.0)
Platelets: 117 10*3/uL — ABNORMAL LOW (ref 150–400)
RBC: 3.49 MIL/uL — ABNORMAL LOW (ref 4.22–5.81)
RDW: 12.2 % (ref 11.5–15.5)
WBC: 6.6 10*3/uL (ref 4.0–10.5)
nRBC: 0 % (ref 0.0–0.2)

## 2021-03-09 LAB — BASIC METABOLIC PANEL
Anion gap: 8 (ref 5–15)
BUN: 22 mg/dL (ref 8–23)
CO2: 23 mmol/L (ref 22–32)
Calcium: 8.4 mg/dL — ABNORMAL LOW (ref 8.9–10.3)
Chloride: 103 mmol/L (ref 98–111)
Creatinine, Ser: 1.6 mg/dL — ABNORMAL HIGH (ref 0.61–1.24)
GFR, Estimated: 41 mL/min — ABNORMAL LOW (ref >60)
Glucose, Bld: 231 mg/dL — ABNORMAL HIGH (ref 70–99)
Potassium: 4.6 mmol/L (ref 3.5–5.1)
Sodium: 134 mmol/L — ABNORMAL LOW (ref 135–145)

## 2021-03-09 LAB — GLUCOSE, CAPILLARY
Glucose-Capillary: 253 mg/dL — ABNORMAL HIGH (ref 70–99)
Glucose-Capillary: 219 mg/dL — ABNORMAL HIGH (ref 70–99)
Glucose-Capillary: 209 mg/dL — ABNORMAL HIGH (ref 70–99)
Glucose-Capillary: 209 mg/dL — ABNORMAL HIGH (ref 70–99)

## 2021-03-09 MED ORDER — SODIUM CHLORIDE 0.9 % IV SOLN: INTRAVENOUS | Status: DC

## 2021-03-09 NOTE — Progress Notes (Signed)
Inpatient Diabetes Program Recommendations  AACE/ADA: New Consensus Statement on Inpatient Glycemic Control (2015)  Target Ranges:  Prepandial:   less than 140 mg/dL      Peak postprandial:   less than 180 mg/dL (1-2 hours)      Critically ill patients:  140 - 180 mg/dL   Lab Results  Component Value Date   GLUCAP 253 (H) 03/09/2021   HGBA1C 8.3 (H) 03/08/2021    Review of Glycemic Control  Latest Reference Range & Units 03/08/21 12:05 03/08/21 18:02 03/09/21 07:31  Glucose-Capillary 70 - 99 mg/dL 250 (H) 198 (H) 253 (H)  (H): Data is abnormally high Diabetes history: Type 2 DM Outpatient Diabetes medications: Glipizide 5 mg QD, Metformin 500 mg BID Current orders for Inpatient glycemic control: Novolog 0-9 units TID  Inpatient Diabetes Program Recommendations:    Noted consult.  Would consider adding Semglee 8 units QD and Novolog 0-5 units QHS.   Thanks, Bronson Curb, MSN, RNC-OB Diabetes Coordinator [phone] (8a-5p)

## 2021-03-09 NOTE — Progress Notes (Signed)
Physical Therapy Treatment Patient Details Name: [name] MRN: [id number] DOB: 1931/01/23 Today's Date: 03/09/2021   History of Present Illness Pt is an 87 year old man admitted on 03/07/21 with L side weakness. MRI + for small foci ischemia R pons and L frontal periventricular white matter. PMH. Alzheimers dementia, DM, HTN, stage 3 CKD, lung ca, prostate ca, multiple skin cancers, CAD, anxiety, macular degeneration.    PT Comments    Patient able to progress to standing and bed to chair transfer with +1 mod assist. Able to stand for 30 seconds without needing Left knee blocked. Patient very motivated to improve and can tolerate >3 hours of therapy per day.     Recommendations for follow up therapy are one component of a multi-disciplinary discharge planning process, led by the attending physician.  Recommendations may be updated based on patient status, additional functional criteria and insurance authorization.  Follow Up Recommendations  Acute inpatient rehab (3hours/day)     Assistance Recommended at Discharge Frequent or constant Supervision/Assistance  Equipment Recommendations  Wheelchair cushion (measurements PT);Wheelchair (measurements PT)    Recommendations for Other Services Rehab consult     Precautions / Restrictions Precautions Precautions: Fall Precaution Comments: son endorses a few sporadic falls     Mobility  Bed Mobility Overal bed mobility: Needs Assistance Bed Mobility: Rolling;Sidelying to Sit Rolling: Mod assist (to right) Sidelying to sit: Mod assist;HOB elevated       General bed mobility comments: assist to guide LEs over EOB and to raise trunk, assist to scoot hips out to EOB and get feet on the floor    Transfers Overall transfer level: Needs assistance Equipment used: None Transfers: Sit to/from Stand;Bed to chair/wheelchair/BSC Sit to Stand: Mod assist Stand pivot transfers: Mod assist         General transfer comment: assist  to rise and steady with L knee blocked. After transfer to chair, stood from chair and able to stand without needing Lt knee blocked    Ambulation/Gait                   Stairs             Wheelchair Mobility    Modified Rankin (Stroke Patients Only)       Balance Overall balance assessment: Needs assistance Sitting-balance support: Bilateral upper extremity supported Sitting balance-Leahy Scale: Poor Sitting balance - Comments: able to maintain midline with closeguard, however one episode of LOB to his rt with mod assist to recover   Standing balance support: Bilateral upper extremity supported Standing balance-Leahy Scale: Poor                              Cognition Arousal/Alertness: Awake/alert Behavior During Therapy: WFL for tasks assessed/performed Overall Cognitive Status: History of cognitive impairments - at baseline                                 General Comments: Dementia at baseline        Exercises      General Comments General comments (skin integrity, edema, etc.): EOB several minutes in preparation for transfer. Educated pt on [place] for transfer, however pt immediately began pivoting to chair before achieving full uprights standing.      Pertinent Vitals/Pain Pain Assessment: No/denies pain Faces Pain Scale: No hurt    Home Living  Prior Function            PT Goals (current goals can now be found in the care plan section) Acute Rehab PT Goals Patient Stated Goal: to get better PT Goal Formulation: With patient Time For Goal Achievement: 03/22/21 Potential to Achieve Goals: Good Progress towards PT goals: Progressing toward goals    Frequency    Min 4X/week      PT Plan Current plan remains appropriate    Co-evaluation              AM-PAC PT "6 Clicks" Mobility   Outcome Measure  Help needed turning from your back to your side while in a  flat bed without using bedrails?: A Lot Help needed moving from lying on your back to sitting on the side of a flat bed without using bedrails?: A Lot Help needed moving to and from a bed to a chair (including a wheelchair)?: A Lot Help needed standing up from a chair using your arms (e.g., wheelchair or bedside chair)?: A Lot Help needed to walk in hospital room?: Total Help needed climbing 3-5 steps with a railing? : Total 6 Click Score: 10    End of Session Equipment Utilized During Treatment: Gait belt Activity Tolerance: Patient tolerated treatment well Patient left: with call bell/phone within reach;with family/visitor present;in chair;with chair alarm set (on stretcher in ED) Nurse Communication: Mobility status (transfer to his right) PT Visit Diagnosis: Unsteadiness on feet (R26.81);Muscle weakness (generalized) (M62.81);Difficulty in walking, not elsewhere classified (R26.2)     Time: 2831-5176 PT Time Calculation (min) (ACUTE ONLY): 23 min  Charges:  $Neuromuscular Re-education: 23-37 mins                      [name], PT Acute Rehabilitation Services  Pager [phone] Office [phone]    [name] 03/09/2021, 2:57 PM

## 2021-03-09 NOTE — Progress Notes (Signed)
Inpatient Rehabilitation Admissions Coordinator   I will begin Auth with [facility] for a possible [facility] admit.  Danne Baxter, RN, MSN Rehab Admissions Coordinator [phone] 03/09/2021 3:38 PM

## 2021-03-09 NOTE — Progress Notes (Signed)
Inpatient Rehabilitation Admissions Coordinator   I met at bedside with patient and his daughter, Tammy. Patient was independent at home pta and with mild cognition issues with his dementia. His wife has a caregiver, Juanita, in the home to assist her over the past year. Family can arrange caregiver supports at home and prefer Cir admit. I await further therapy progress before pursuing Auth with [employer].   , RN, MSN Rehab Admissions Coordinator [phone] 03/09/2021 12:35 PM  

## 2021-03-09 NOTE — Plan of Care (Signed)

## 2021-03-09 NOTE — Progress Notes (Signed)
[facility] PROGRESS NOTE   [name] [id number] DOB: 1930/12/31 DOA: 03/07/2021  PCP: [name], MD  Brief History/Interval Summary: [name] is a 87 y.o. male with medical history significant for Alzheimer dementia, type 2 diabetes mellitus, essential hypertension, stage IIIb chronic kidney disease with baseline creatinine 1.5-1.9,  who is admitted to [facility] on 03/07/2021 with acute ischemic CVA after presenting from home to [facility] ED complaining of left-sided weakness.    Consultants: Neurology  Procedures: Transthoracic echocardiogram is pending.  Carotid Doppler.    Subjective/Interval History: Patient remains pleasantly confused.  Continues to have weakness in his left side.  Denies any chest pain shortness of breath.  No nausea vomiting.    Assessment/Plan:  Acute stroke Patient with left-sided hemiparesis and facial droop.  MRI confirmed stroke.  Neurology has been consulted. LDL is 83.  HbA1c 8.3.  Patient is on atorvastatin. Echocardiogram shows normal systolic function with grade 1 diastolic dysfunction.  No embolic source identified.   No significant stenosis noted on carotid Doppler. Physical therapy.  Inpatient rehabilitation is recommended.   Speech therapy evaluation.  Seen by speech therapy.  On dysphagia 3 diet with nectar thick liquids. Neurology recommended aspirin and Plavix for 3 weeks followed by aspirin alone. Allowing permissive hypertension.    History of dementia Pleasantly confused.  Continue Aricept.  Baseline functional status is not entirely clear.  Acute on chronic kidney disease stage IIIb Worsening creatinine noted this morning.  Possibly due to poor oral intake.  We will gently hydrate for 24 hours.  Monitor urine output.  Recheck labs tomorrow.   Avoid nephrotoxic agents.  Macrocytic anemia Check anemia panel.  Check TSH.  No evidence of overt blood loss.  Diabetes mellitus type 2, uncontrolled with  hyperglycemia Patient on glipizide at home.  HbA1c is 8.3.   Initiate basal insulin.  Continue SSI.  Mild thrombocytopenia Monitor periodically.  Stable.  History of generalized anxiety disorder Continue Zoloft.   DVT Prophylaxis: Initiate Lovenox Code Status: Full code Family Communication:  No family at bedside Disposition Plan: Inpatient rehabilitation is recommended.  Status is: Inpatient  Remains inpatient appropriate because: Acute stroke, need for rehabilitation      Medications: Scheduled:  aspirin EC  81 mg Oral Daily   atorvastatin  20 mg Oral q1800   clopidogrel  75 mg Oral Daily   donepezil  10 mg Oral QHS   enoxaparin (LOVENOX) injection  40 mg Subcutaneous Q24H   insulin aspart  0-9 Units Subcutaneous TID WC   pantoprazole  40 mg Oral Daily   sertraline  100 mg Oral Daily   Continuous:  sodium chloride 75 mL/hr at 03/09/21 0854   KDT:[id number] **OR** acetaminophen, food thickener  Antibiotics: Anti-infectives (From admission, onward)    None       Objective:  Vital Signs  Vitals:   03/08/21 1952 03/09/21 0003 03/09/21 0528 03/09/21 0728  BP: (!) 121/95 (!) 104/19 (!) 162/76 (!) 168/83  Pulse: 71 68 74 69  Resp: 20 20 19 18   Temp: 98.8 F (37.1 C) 98.1 F (36.7 C) 98.3 F (36.8 C) 98.3 F (36.8 C)  TempSrc:  Oral Oral Oral  SpO2: 98% 93% 93% 95%  Weight:      Height:        Intake/Output Summary (Last 24 hours) at 03/09/2021 1124 Last data filed at 03/08/2021 2223 Gross per 24 hour  Intake --  Output 275 ml  Net -275 ml  Filed Weights   03/07/21 1801  Weight: 82.6 kg    General appearance: Awake alert.  In no distress Resp: Clear to auscultation bilaterally.  Normal effort Cardio: S1-S2 is normal regular.  No S3-S4.  No rubs murmurs or bruit GI: Abdomen is soft.  Nontender nondistended.  Bowel sounds are present normal.  No masses organomegaly Extremities: No edema.   Neurologic: Left hemiparesis  General  appearance: Awake alert.  In no distress.  Distracted  Lab Results:  Data Reviewed: I have personally reviewed following labs and imaging studies  CBC: Recent Labs  Lab 03/07/21 1928 03/08/21 0553 03/09/21 0037  WBC 7.6 6.2 6.6  NEUTROABS 5.4  --   --   HGB 13.1 11.7* 11.7*  HCT 39.1 35.6* 34.9*  MCV 100.8* 100.3* 100.0  PLT 121* 105* 117*     Basic Metabolic Panel: Recent Labs  Lab 03/07/21 1928 03/08/21 0424 03/08/21 1037 03/09/21 0037  NA 138 139 135 134*  K 5.2* 2.8* 4.6 4.6  CL 104 119* 105 103  CO2 25 17* 23 23  GLUCOSE 229* 145* 278* 231*  BUN 20 14 17 22   CREATININE 1.50* 0.90 1.30* 1.60*  CALCIUM 9.0 5.6* 8.7* 8.4*  MG  --  1.4*  --   --      GFR: Estimated Creatinine Clearance: 31.9 mL/min (A) (by C-G formula based on SCr of 1.6 mg/dL (H)).  Liver Function Tests: Recent Labs  Lab 03/07/21 1928 03/08/21 0424  AST 11* 6*  ALT 11 6  ALKPHOS 81 48  BILITOT 0.6 0.6  PROT 6.9 3.8*  ALBUMIN 3.8 2.1*      Coagulation Profile: Recent Labs  Lab 03/07/21 1928  INR 1.1      HbA1C: Recent Labs    03/08/21 0553  HGBA1C 8.3*     CBG: Recent Labs  Lab 03/08/21 0805 03/08/21 1205 03/08/21 1802 03/09/21 0731  GLUCAP 214* 250* 198* 253*     Lipid Profile: Recent Labs    03/08/21 0424  CHOL 122  HDL 17*  LDLCALC 83  TRIG 109  CHOLHDL 7.2      Recent Results (from the past 240 hour(s))  Resp Panel by RT-PCR (Flu A&B, Covid) Nasopharyngeal Swab     Status: None   Collection Time: 03/07/21  7:24 PM   Specimen: Nasopharyngeal Swab; Nasopharyngeal(NP) swabs in vial transport medium  Result Value Ref Range Status   SARS Coronavirus 2 by RT PCR NEGATIVE NEGATIVE Final    Comment: (NOTE) SARS-CoV-2 target nucleic acids are NOT DETECTED.  The SARS-CoV-2 RNA is generally detectable in upper respiratory specimens during the acute phase of infection. The lowest concentration of SARS-CoV-2 viral copies this assay can detect  is 138 copies/mL. A negative result does not preclude SARS-Cov-2 infection and should not be used as the sole basis for treatment or other patient management decisions. A negative result may occur with  improper specimen collection/handling, submission of specimen other than nasopharyngeal swab, presence of viral mutation(s) within the areas targeted by this assay, and inadequate number of viral copies(<138 copies/mL). A negative result must be combined with clinical observations, patient history, and epidemiological information. The expected result is Negative.  Fact Sheet for Patients:  EntrepreneurPulse.com.au  Fact Sheet for Healthcare Providers:  IncredibleEmployment.be  This test is no t yet approved or cleared by the [place] FDA and  has been authorized for detection and/or diagnosis of SARS-CoV-2 by FDA under an Emergency Use Authorization (EUA). This EUA will remain  in effect (meaning this test can be used) for the duration of the COVID-19 declaration under Section 564(b)(1) of the Act, 21 U.S.C.section 360bbb-3(b)(1), unless the authorization is terminated  or revoked sooner.       Influenza A by PCR NEGATIVE NEGATIVE Final   Influenza B by PCR NEGATIVE NEGATIVE Final    Comment: (NOTE) The Xpert Xpress SARS-CoV-2/FLU/RSV plus assay is intended as an aid in the diagnosis of influenza from Nasopharyngeal swab specimens and should not be used as a sole basis for treatment. Nasal washings and aspirates are unacceptable for Xpert Xpress SARS-CoV-2/FLU/RSV testing.  Fact Sheet for Patients: EntrepreneurPulse.com.au  Fact Sheet for Healthcare Providers: IncredibleEmployment.be  This test is not yet approved or cleared by the [place] FDA and has been authorized for detection and/or diagnosis of SARS-CoV-2 by FDA under an Emergency Use Authorization (EUA). This EUA will remain in effect  (meaning this test can be used) for the duration of the COVID-19 declaration under Section 564(b)(1) of the Act, 21 U.S.C. section 360bbb-3(b)(1), unless the authorization is terminated or revoked.  Performed at [facility], [place] [address] [place], [place] [address]        Radiology Studies: CT Head Wo Contrast  Result Date: 03/07/2021 CLINICAL DATA:  Altered mental status stroke suspected. EXAM: CT HEAD WITHOUT CONTRAST TECHNIQUE: Contiguous axial images were obtained from the base of the skull through the vertex without intravenous contrast. COMPARISON:  No comparison imaging is available, reports from 2004 are noted and reports from 2001 as well. FINDINGS: Brain: No evidence of acute infarction, hemorrhage, hydrocephalus, extra-axial collection or mass lesion/mass effect. Severe cerebral atrophy. Atrophy reported on previous imaging as severe. Vascular: No hyperdense vessel or unexpected calcification. Skull: Normal. Negative for fracture or focal lesion. Sinuses/Orbits: Visualized paranasal sinuses and orbits without acute process. Other: None. IMPRESSION: No acute intracranial pathology. Severe cerebral atrophy. Electronically Signed   By: Zetta Bills M.D.   On: 03/07/2021 19:57   MR ANGIO HEAD WO CONTRAST  Result Date: 03/08/2021 CLINICAL DATA:  Follow-up stroke. Left-sided weakness and facial droop. Pint teen and left frontal infarctions. EXAM: MRA HEAD WITHOUT CONTRAST TECHNIQUE: Angiographic images of the Circle of Willis were acquired using MRA technique without intravenous contrast. COMPARISON:  MRI earlier same day FINDINGS: Anterior circulation: Both internal carotid arteries are widely patent through the skull base and siphon regions. The anterior and middle cerebral vessels are patent without proximal stenosis, aneurysm or vascular malformation. Posterior circulation: Both vertebral arteries are patent through the foramen magnum to the basilar. No V4 segment stenosis. The  basilar artery is tortuous but widely patent. Both superior cerebellar arteries show flow. Both posterior cerebral arteries show flow. The left posterior cerebral artery receives most of it supply from the anterior circulation with a small contribution from the basilar tip. The right posterior cerebral artery arises from the basilar tip and shows a severe stenosis 1.5 cm beyond its origin. Anatomic variants: None other significant. Other: None. IMPRESSION: No intracranial large vessel occlusion. Both vertebral arteries are patent to the basilar. No basilar stenosis. Severe stenosis of the right posterior cerebral artery 1.5 cm beyond its origin, but patent beyond that. Left PCA receives most of it supply from the anterior circulation but does have a small contribution from the basilar tip. No significant anterior circulation finding. Electronically Signed   By: Nelson Chimes M.D.   On: 03/08/2021 08:05   MR BRAIN WO CONTRAST  Result Date: 03/08/2021 CLINICAL DATA:  Left-sided weakness  with facial droop EXAM: MRI HEAD WITHOUT CONTRAST TECHNIQUE: Multiplanar, multiecho pulse sequences of the brain and surrounding structures were obtained without intravenous contrast. COMPARISON:  None. FINDINGS: Brain: There is a small focus of abnormal diffusion restriction the ventral right pons. There is also a punctate focus of diffusion restriction in the left frontal periventricular white matter. No acute or chronic hemorrhage. There is multifocal hyperintense T2-weighted signal within the white matter. Diffuse, severe atrophy, worst in the frontal and parietal lobes. Bilateral subdural hygromas. The midline structures are normal. Vascular: Major flow voids are preserved. Skull and upper cervical spine: Normal calvarium and skull base. Visualized upper cervical spine and soft tissues are normal. Sinuses/Orbits:No paranasal sinus fluid levels or advanced mucosal thickening. No mastoid or middle ear effusion. Normal orbits.  IMPRESSION: 1. Small foci of acute ischemia within the ventral right pons and left frontal periventricular white matter. No hemorrhage or mass effect. 2. Diffuse, severe atrophy, worst in the frontal and parietal lobes. 3. Bilateral subdural hygromas. Electronically Signed   By: Ulyses Jarred M.D.   On: 03/08/2021 00:30   DG Chest Portable 1 View  Result Date: 03/07/2021 CLINICAL DATA:  Shortness of breath. Left-sided weakness. Stroke presentation. EXAM: PORTABLE CHEST 1 VIEW COMPARISON:  05/18/2020 FINDINGS: The right lung is clear. There is probably been lobectomy on the left. There is chronic pleural and parenchymal scarring on the left. No sign of active infiltrate, mass, effusion or collapse. No acute bone finding. IMPRESSION: Status post lobectomy on the left with chronic scarring. No active process evident. Electronically Signed   By: Nelson Chimes M.D.   On: 03/07/2021 20:10   DG Swallowing Func-Speech Pathology  Result Date: 03/08/2021 Table formatting from the original result was not included. Objective Swallowing Evaluation: Type of Study: MBS-Modified Barium Swallow Study  Patient Details Name: [name] MRN: [id number] Date of Birth: 10/26/1930 Today's Date: 03/08/2021 Time: SLP Start Time (ACUTE ONLY): 70 -SLP Stop Time (ACUTE ONLY): 1320 SLP Time Calculation (min) (ACUTE ONLY): 20 min Past Medical History: Past Medical History: Diagnosis Date  Adenocarcinoma of left lung, stage 1 ([place]) 2001  T1N0 stage I  adenoca left lung resected 01/03/00   Alzheimer's disease ([place])   CAD (coronary artery disease) 2014  a. 09/2012 Cath: LM nl, LAD 50-60p, D1 60-70 m, D1 50ost, LCX nl, RCA min irregs, EF 55-65%.  Generalized anxiety disorder 01/08/2007  Qualifier: Diagnosis of  By: Diona Browner MD, [name]    History of colonic polyps 07/25/2011  Macular degeneration of both eyes 01/21/2014  Nonmelanoma skin cancer 07/25/2011  Multiple lesions excised face/nose  Pericarditis   a. 09/2012 with effusion and tamponade,  s/p window.  b.  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 SCC IN SITU TX CX3  SCC (squamous cell carcinoma) 04/12/2005  RIGHT NECK SUP.  SCC IN SITU TX EXC  SCC (squamous cell carcinoma) 04/16/2013  LEFT TEMPLE SCC IN SITU TX CX3 5FU  SCC (squamous cell carcinoma) 04/16/2013  BELOW LEFT EYE SCC IN SITU TX CX3 5FU  SCC (squamous cell carcinoma) 04/16/2013  BELOW RIGHT EYE SCC IN SITU TX CX3 5FU  SCC (squamous cell carcinoma) 08/04/2014  LEFT CHEEK SCC IN SITU TX CX3 5FU  SCC (squamous cell carcinoma) 11/27/2018  LEFT TEMPLE SCC IN SITU TX WITH BX  SCC (squamous cell carcinoma)   SCC (squamous cell carcinoma)   SCC (squamous cell carcinoma)   SCC (squamous cell carcinoma) Well Diff 09/13/2016  Tip of Nose SCC WELL DIFF TX (MOH's), and RIGHT INNER EYE SUP. SCC IN SITU TX TO WATCH  SCC (squamous cell carcinoma) Well Diff 05/30/2017  Right Cheekbone (Cx3,5FU) and Under Left Eye (Cx3,5FU)  Squamous cell carcinoma in situ (SCCIS) 04/12/2005  Right Neck Inf (Cx3), Right Neck Sup (Exc), and Bulb of Nose (Cx3,Exc)  Squamous cell carcinoma in situ (SCCIS) 09/27/2005  Nose (Cx3,Exc)  Squamous cell carcinoma in situ (SCCIS) 02/18/2013  Below Left Eye (tx p bx)  Squamous cell carcinoma in situ (SCCIS) 04/16/2013  Left Temple (Cx3,5FU), Below Left Eye (Cx3,5FU), Below Right Eye (Cx3,5FU)  Squamous cell carcinoma in situ (SCCIS) 08/04/2014   Left Cheek (Cx3,5FU)  Squamous cell carcinoma in situ (SCCIS) 09/13/2016  Right Inner Eye Sup. 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  Type 2 diabetes mellitus with vascular disease ([place]) 05/21/2007  Qualifier: Diagnosis of  By: Diona Browner MD, [name]    Type 2 DM with CKD stage 3 and hypertension ([place]) 07/16/2015  Unspecified essential hypertension  Past Surgical History: Past Surgical History: Procedure Laterality Date  HERNIA REPAIR    LEFT HEART CATHETERIZATION WITH CORONARY ANGIOGRAM N/A 09/13/2012  Procedure: LEFT HEART CATHETERIZATION WITH CORONARY ANGIOGRAM;  Surgeon: Sherren Mocha, MD;  Location: [facility] CATH LAB;  Service: Cardiovascular;  Laterality: N/A;  LOBECTOMY  2001  upper left  PERICARDIAL TAP N/A 09/16/2012  Procedure: PERICARDIAL TAP;  Surgeon: Sinclair Grooms, MD;  Location: [facility] CATH LAB;  Service: Cardiovascular;  Laterality: N/A;  PROSTATECTOMY  2003  SUBXYPHOID PERICARDIAL WINDOW N/A 09/16/2012  Procedure: SUBXYPHOID PERICARDIAL WINDOW;  Surgeon: Rexene Alberts, MD;  Location: [facility] OR;  Service: Thoracic;  Laterality: N/A;  TONSILLECTOMY   HPI: Patient is an 87 y.o. male with PMH: Alzheimer's dementia, DM-2, essential HTN, stage IIIb chronic kidney disease who was admitted to [facility] on 12/4 with acute ischemic CVA after presenting from home to [facility] ED c/o left sided weakness. CXR negative for any active processes, CT head negative for acute intracranial abnormality but MRI brain showing small foci of acute ischemia within ventral right pons and left forntal periventricular white matter; no hemorrhage or mass; diffuse, severe atrophy worst in frontal and parietal lobes.  Subjective: pleasant, alert, sitting in chair in radiology suite   Recommendations for follow up therapy are one component of a multi-disciplinary discharge planning process, led by the attending physician.  Recommendations may be updated based on patient status, additional functional criteria and insurance authorization. Assessment / Plan / Recommendation Clinical Impressions 03/08/2021 Clinical Impression Patient presents with a mild oropharyngeal dysphagia as per  this MBS. He exhibited mildly delayed mastication of regular solids and reduced anterior to posterior transit of puree solids, barium tablet and regular solids. Swallow was initiated at level of vallecular sinus with puree solids, nectar thick liquids and regular solids and initiated at level of almost at pyriform sinus with thin liquids. Silent, trace aspiration occured during the swallow with each small sip of thin liquids (PAS 8) secondary to reduced airway protection. No aspiration or penetration observed with nectar thick liquids even with straw sips and taking barium tablet with nectar thick liquid sip. No significant amount of pharyngeal residuals remained post initial swallows. Appearance of prominent cricopharyngeal bar as well as questionable cervical osteophytes ( no radiologist present to confirm) resulted in mildly reduced transit but no retention of barium. No esophageal backflow observed and esophageal sweep did not reveal anything significant. SLP is recommending Dys 3 solids, nectar thick liquids at this time. SLP Visit Diagnosis Dysphagia, unspecified (R13.10) Attention and concentration deficit following -- Frontal lobe and executive function deficit following -- Impact on safety and function Mild aspiration risk;Moderate aspiration risk   Treatment Recommendations 03/08/2021 Treatment Recommendations Therapy as outlined in treatment plan below   Prognosis 03/08/2021 Prognosis for Safe Diet Advancement Good Barriers to Reach Goals Cognitive deficits Barriers/Prognosis Comment h/o Alzheimer's dementia  Diet Recommendations 03/08/2021 SLP Diet Recommendations Dysphagia 3 (Mech soft) solids;Nectar thick liquid Liquid Administration via Cup;Straw Medication Administration Whole meds with liquid Compensations Minimize environmental distractions;Slow rate;Small sips/bites Postural Changes --   Other Recommendations 03/08/2021 Recommended Consults -- Oral Care Recommendations Oral care BID Other Recommendations Order thickener from pharmacy;Prohibited food (jello, ice cream, thin soups);Remove water pitcher;Clarify dietary restrictions Follow Up Recommendations Acute inpatient rehab (3hours/day) Assistance recommended at discharge Frequent or constant Supervision/Assistance Functional Status Assessment Patient has had a recent decline in their functional status and demonstrates the ability to make significant improvements in function in a reasonable and predictable amount of time. Frequency and Duration  03/08/2021 Speech Therapy Frequency (ACUTE ONLY) min 2x/week Treatment Duration 1 week   Oral Phase 03/08/2021 Oral Phase Impaired Oral - Pudding Teaspoon -- Oral - Pudding Cup -- Oral - Honey Teaspoon -- Oral - Honey Cup -- Oral - Nectar Teaspoon -- Oral - Nectar Cup WFL Oral - Nectar Straw WFL Oral - Thin Teaspoon -- Oral - Thin Cup WFL Oral - Thin Straw -- Oral - Puree Delayed oral transit;Reduced posterior propulsion Oral - Mech Soft -- Oral - Regular Impaired mastication;Delayed oral transit;Reduced posterior propulsion Oral - Multi-Consistency -- Oral - Pill Delayed oral transit;Decreased bolus cohesion;Reduced posterior propulsion Oral Phase - Comment --  Pharyngeal Phase 03/08/2021 Pharyngeal Phase Impaired Pharyngeal- Pudding Teaspoon -- Pharyngeal -- Pharyngeal- Pudding Cup -- Pharyngeal -- Pharyngeal- Honey Teaspoon -- Pharyngeal -- Pharyngeal- Honey Cup -- Pharyngeal -- Pharyngeal- Nectar Teaspoon -- Pharyngeal -- Pharyngeal- Nectar Cup Delayed swallow initiation-vallecula Pharyngeal -- Pharyngeal- Nectar Straw  Delayed swallow initiation-vallecula Pharyngeal -- Pharyngeal- Thin Teaspoon -- Pharyngeal -- Pharyngeal- Thin Cup Delayed swallow initiation-vallecula;Delayed swallow initiation-pyriform sinuses;Reduced airway/laryngeal closure;Penetration/Aspiration during swallow;Trace aspiration Pharyngeal Material enters airway, passes BELOW cords without attempt by patient to eject out (silent aspiration) Pharyngeal- Thin Straw -- Pharyngeal -- Pharyngeal- Puree Delayed swallow initiation-vallecula Pharyngeal -- Pharyngeal- Mechanical Soft -- Pharyngeal -- Pharyngeal- Regular Delayed swallow initiation-vallecula Pharyngeal -- Pharyngeal- Multi-consistency -- Pharyngeal -- Pharyngeal- Pill WFL Pharyngeal -- Pharyngeal Comment --  Cervical Esophageal Phase  03/08/2021 Cervical Esophageal Phase Impaired Pudding Teaspoon -- Pudding Cup -- Honey Teaspoon -- Honey Cup -- Nectar Teaspoon --  Nectar Cup Prominent cricopharyngeal segment Nectar Straw Prominent cricopharyngeal segment Thin Teaspoon -- Thin Cup Prominent cricopharyngeal segment Thin Straw -- Puree Prominent cricopharyngeal segment Mechanical Soft -- Regular Prominent cricopharyngeal segment Multi-consistency -- Pill Prominent cricopharyngeal segment Cervical Esophageal Comment -- Sonia Baller, MA, CCC-SLP Speech Therapy                     ECHOCARDIOGRAM COMPLETE  Result Date: 03/08/2021    ECHOCARDIOGRAM REPORT   Patient Name:   [name] Date of Exam: 03/08/2021 Medical Rec #:  [id number]        Height:       69.0 in Accession #:    [id number]       Weight:       182.0 lb Date of Birth:  July 31, 1930        BSA:          1.985 m Patient Age:    59 years         BP:           171/84 mmHg Patient Gender: M                HR:           72 bpm. Exam Location:  Inpatient Procedure: 2D Echo, Cardiac Doppler and Color Doppler Indications:    stroke  History:        Patient has no prior history of Echocardiogram examinations.                 Stroke; Risk  Factors:Diabetes and Hypertension.  Sonographer:    Melissa Morford RDCS (AE, PE) Referring Phys: [id number] Rhetta Mura  Sonographer Comments: Technically difficult study due to poor echo windows. IMPRESSIONS  1. Left ventricular ejection fraction, by estimation, is 50 to 55%. The left ventricle has low normal function. The left ventricle has no regional wall motion abnormalities. There is moderate asymmetric left ventricular hypertrophy of the basal-septal segment. Left ventricular diastolic parameters are consistent with Grade I diastolic dysfunction (impaired relaxation).  2. Right ventricular systolic function is mildly reduced. The right ventricular size is normal.  3. The mitral valve is normal in structure. No evidence of mitral valve regurgitation. No evidence of mitral stenosis.  4. The aortic valve is tricuspid. Aortic valve regurgitation is trivial. No aortic stenosis is present.  5. The inferior vena cava is normal in size with greater than 50% respiratory variability, suggesting right atrial pressure of 3 mmHg. FINDINGS  Left Ventricle: Left ventricular ejection fraction, by estimation, is 50 to 55%. The left ventricle has low normal function. The left ventricle has no regional wall motion abnormalities. The left ventricular internal cavity size was normal in size. There is moderate asymmetric left ventricular hypertrophy of the basal-septal segment. Left ventricular diastolic parameters are consistent with Grade I diastolic dysfunction (impaired relaxation). Right Ventricle: The right ventricular size is normal. No increase in right ventricular wall thickness. Right ventricular systolic function is mildly reduced. The tricuspid regurgitant velocity is 1.68 m/s, and with an assumed right atrial pressure of 3 mmHg, the estimated right ventricular systolic pressure is 30.8 mmHg. Left Atrium: Left atrial size was normal in size. Right Atrium: Right atrial size was normal in size. Pericardium: There is  no evidence of pericardial effusion. Mitral Valve: The mitral valve is normal in structure. No evidence of mitral valve regurgitation. No evidence of mitral valve stenosis. Tricuspid Valve: The tricuspid valve is normal in structure. Tricuspid valve  regurgitation is trivial. Aortic Valve: The aortic valve is tricuspid. Aortic valve regurgitation is trivial. No aortic stenosis is present. Pulmonic Valve: The pulmonic valve was grossly normal. Pulmonic valve regurgitation is trivial. Aorta: The aortic root and ascending aorta are structurally normal, with no evidence of dilitation. Venous: The inferior vena cava is normal in size with greater than 50% respiratory variability, suggesting right atrial pressure of 3 mmHg. IAS/Shunts: The interatrial septum was not well visualized.  LEFT VENTRICLE PLAX 2D LVIDd:         4.60 cm   Diastology LVIDs:         3.40 cm   LV e' medial:    6.20 cm/s LV PW:         1.30 cm   LV E/e' medial:  5.0 LV IVS:        1.30 cm   LV e' lateral:   10.90 cm/s LVOT diam:     2.30 cm   LV E/e' lateral: 2.9 LV SV:         58 LV SV Index:   29 LVOT Area:     4.15 cm  RIGHT VENTRICLE RV S prime:     9.79 cm/s TAPSE (M-mode): 1.3 cm LEFT ATRIUM             Index        RIGHT ATRIUM           Index LA diam:        3.80 cm 1.91 cm/m   RA Area:     14.10 cm LA Vol (A2C):   46.3 ml 23.33 ml/m  RA Volume:   37.90 ml  19.09 ml/m LA Vol (A4C):   18.9 ml 9.52 ml/m LA Biplane Vol: 30.7 ml 15.47 ml/m  AORTIC VALVE LVOT Vmax:   94.80 cm/s LVOT Vmean:  61.600 cm/s LVOT VTI:    0.140 m  AORTA Ao Root diam: 3.80 cm Ao Asc diam:  3.30 cm MITRAL VALVE               TRICUSPID VALVE MV Area (PHT): 5.27 cm    TR Peak grad:   11.3 mmHg MV Decel Time: 144 msec    TR Vmax:        168.00 cm/s MV E velocity: 31.13 cm/s MV A velocity: 91.30 cm/s  SHUNTS MV E/A ratio:  0.34        Systemic VTI:  0.14 m                            Systemic Diam: 2.30 cm Oswaldo Milian MD Electronically signed by Oswaldo Milian MD Signature Date/Time: 03/08/2021/1:30:23 PM    Final    VAS US CAROTID  Result Date: 03/08/2021 Carotid Arterial Duplex Study Patient Name:  [name]  Date of Exam:   03/08/2021 Medical Rec #: [id number]         Accession #:    [id number] Date of Birth: Oct 21, 1930         Patient Gender: M Patient Age:   73 years Exam Location:  [facility] Procedure:      VAS US CAROTID Referring Phys: Babs Bertin --------------------------------------------------------------------------------  Indications:       CVA. Risk Factors:      Hypertension, hyperlipidemia, Diabetes, past history of                    smoking, coronary artery disease.  Other Factors:     CKD3. Comparison Study:  No previous exams Performing Technologist: Jody Hill RVT, RDMS  Examination Guidelines: A complete evaluation includes B-mode imaging, spectral Doppler, color Doppler, and power Doppler as needed of all accessible portions of each vessel. Bilateral testing is considered an integral part of a complete examination. Limited examinations for reoccurring indications may be performed as noted.  Right Carotid Findings: +----------+--------+--------+--------+------------------+------------------+           PSV cm/sEDV cm/sStenosisPlaque DescriptionComments           +----------+--------+--------+--------+------------------+------------------+ CCA Prox  62      10                                intimal thickening +----------+--------+--------+--------+------------------+------------------+ CCA Distal63      10                                                   +----------+--------+--------+--------+------------------+------------------+ ICA Prox  73      12              heterogenous                         +----------+--------+--------+--------+------------------+------------------+ ICA Distal61      11                                                    +----------+--------+--------+--------+------------------+------------------+ ECA       78      0                                                    +----------+--------+--------+--------+------------------+------------------+ +----------+--------+-------+----------------+-------------------+           PSV cm/sEDV cmsDescribe        Arm Pressure (mmHG) +----------+--------+-------+----------------+-------------------+ [id number]            Multiphasic, WNL                    +----------+--------+-------+----------------+-------------------+ +---------+--------+--+--------+-+---------+ VertebralPSV cm/s44EDV cm/s7Antegrade +---------+--------+--+--------+-+---------+  Left Carotid Findings: +----------+--------+--------+--------+------------------+------------------+           PSV cm/sEDV cm/sStenosisPlaque DescriptionComments           +----------+--------+--------+--------+------------------+------------------+ CCA Prox  89      13                                intimal thickening +----------+--------+--------+--------+------------------+------------------+ CCA Distal68      9                                                    +----------+--------+--------+--------+------------------+------------------+ ICA Prox  70      14              calcific                             +----------+--------+--------+--------+------------------+------------------+  ICA Distal76      16                                                   +----------+--------+--------+--------+------------------+------------------+ ECA       62      0                                 shadowing          +----------+--------+--------+--------+------------------+------------------+ +----------+--------+--------+----------------+-------------------+           PSV cm/sEDV cm/sDescribe        Arm Pressure (mmHG) +----------+--------+--------+----------------+-------------------+  [id number]              Multiphasic, WNL                    +----------+--------+--------+----------------+-------------------+ +---------+--------+--+--------+-+---------+ VertebralPSV cm/s46EDV cm/s9Antegrade +---------+--------+--+--------+-+---------+   Summary: Right Carotid: The extracranial vessels were near-normal with only minimal wall                thickening or plaque. Left Carotid: The extracranial vessels were near-normal with only minimal wall               thickening or plaque. Vertebrals:  Bilateral vertebral arteries demonstrate antegrade flow. Subclavians: Normal flow hemodynamics were seen in bilateral subclavian              arteries. *See table(s) above for measurements and observations.  Electronically signed by Antony Contras MD on 03/08/2021 at 1:10:33 PM.    Final        LOS: 1 day   [facility] Pager on [email or web address]  03/09/2021, 11:24 AM

## 2021-03-09 NOTE — Progress Notes (Signed)
STROKE TEAM PROGRESS NOTE   INTERVAL HISTORY Patient is seen in his room.  His neurological condition is unchanged she still has significant left hemiplegia.  Vital signs are stable.  Carotid ultrasound showed no significant extracranial stenosis.  Echocardiogram showed normal ejection fraction without cardiac source of embolism. Vitals:   03/09/21 0003 03/09/21 0528 03/09/21 0728 03/09/21 1306  BP: (!) 104/19 (!) 162/76 (!) 168/83 (!) 180/95  Pulse: 68 74 69 79  Resp: 20 19 18    Temp: 98.1 F (36.7 C) 98.3 F (36.8 C) 98.3 F (36.8 C) 98.5 F (36.9 C)  TempSrc: Oral Oral Oral Oral  SpO2: 93% 93% 95% 94%  Weight:      Height:       CBC:  Recent Labs  Lab 03/07/21 1928 03/08/21 0553 03/09/21 0037  WBC 7.6 6.2 6.6  NEUTROABS 5.4  --   --   HGB 13.1 11.7* 11.7*  HCT 39.1 35.6* 34.9*  MCV 100.8* 100.3* 100.0  PLT 121* 105* 937*   Basic Metabolic Panel:  Recent Labs  Lab 03/08/21 0424 03/08/21 1037 03/09/21 0037  NA 139 135 134*  K 2.8* 4.6 4.6  CL 119* 105 103  CO2 17* 23 23  GLUCOSE 145* 278* 231*  BUN 14 17 22   CREATININE 0.90 1.30* 1.60*  CALCIUM 5.6* 8.7* 8.4*  MG 1.4*  --   --    Lipid Panel:  Recent Labs  Lab 03/08/21 0424  CHOL 122  TRIG 109  HDL 17*  CHOLHDL 7.2  VLDL 22  LDLCALC 83   HgbA1c:  Recent Labs  Lab 03/08/21 0553  HGBA1C 8.3*   Urine Drug Screen:  Recent Labs  Lab 03/08/21 1036  LABOPIA NONE DETECTED  COCAINSCRNUR NONE DETECTED  LABBENZ POSITIVE*  AMPHETMU NONE DETECTED  THCU NONE DETECTED  LABBARB NONE DETECTED    Alcohol Level  Recent Labs  Lab 03/07/21 1950  ETH <10    IMAGING past 24 hours No results found.  PHYSICAL EXAM General:  Alert, well-developed, well-nourished elderly Caucasian male patient in no acute distress   NEURO:  Mental Status: AA&Ox3  Speech/Language: speech is without dysarthria or aphasia. Difficult to assess as patient is hard of hearing. Comprehension intact.  Cranial Nerves:  II:  PERRL. Visual fields full.  III, IV, VI: EOMI. Eyelids elevate symmetrically.  V: Sensation is intact to light touch and symmetrical to face.  VII: left sided facial droop VIII: hearing intact to voice. IX, X: Phonation is normal.  XII: left tongue deviation Motor: 5/5 strength to RUE and RLE, 3/5 strength in LUE, 4/5 strength in LLE, more difficulty moving last two fingers of left hand Sensation- Intact to light touch bilaterally. Extinction absent to light touch to DSS.  Coordination: FTN intact bilaterally, drift in LUE and LLE  Gait- deferred   ASSESSMENT/PLAN [name] is a 87 y.o. male with history of lung adenocarcinoma, Alzheimer's disease, CAD, multiple skin CA, HTN and CKD 3 presenting with left-sided weakness that had been present for a few days. HE was brought to the hospital by his family and found to have a right pontine stroke and small left frontal periventricular white matter stroke.  Stroke: Right ventral pontine and tiny left periventricular lacunar infarcts likely from small vessel disease CT head No acute abnormality.  Atrophy.  MRI  Right pontine stroke and small stroke in right periventricular white matter, diffuse, severe atrophy MRA  No intracranial LVO, Severe stenosis of right PCA Carotid doppler No  significant thickening or plaque 2D Echo EF 52-84%, grade 1 diastolic dysfunction, interatrial septum not well visualized LDL 83 HgbA1c 8.3 VTE prophylaxis - lovenox    Diet   DIET DYS 3 Room service appropriate? Yes with Assist; Fluid consistency: Nectar Thick   aspirin 81 mg daily prior to admission, now on aspirin 81 mg daily and clopidogrel 75 mg daily. Continue aspirin and Plavix for 3 weeks then aspirin alone Therapy recommendations:  CIR Disposition:  pending  Hypertension Home meds:  none Stable Keep SBP <180 Long-term BP goal normotensive  Hyperlipidemia Home meds:  none LDL 83, goal < 70 Add atorvastatin 20 mg daily  High intensity  statin deferred due to advanced age Continue statin at discharge  Diabetes type II Uncontrolled Home meds:  Glipzide 5 mg daily, metformin 500 mg daily HgbA1c 8.3, goal < 7.0 CBGs Recent Labs    03/08/21 1802 03/09/21 0731 03/09/21 1310  GLUCAP 198* 253* 219*   Diabetes coordinator consult SSI  Other Stroke Risk Factors Advanced Age >/= 71  Former cigarette smoker   Other Active Problems Alzheimer's disease Continue home Aricept CKD 3 Avoid contrast when possible Renally dose medications as appropriate  Hospital day # 1  Continue ongoing therapy.  Consider transfer to inpatient rehab if family willing [facility] is approved in the next few days.  Discussed with patient and Dr. [place] Pink.  Greater than 50% time during this 25-minute visit was spent in counseling and coordination of care about strokes and need for therapy.  Answered questions.  Stroke team will sign off.  Kindly call for questions.  Follow-up with outpatient sleep clinic in 2 months he Ethan  Antony Castillo, [place] [facility] Pager: [phone] 03/09/2021 2:09 PM    To contact Stroke Continuity provider, please refer to [email or web address] After hours, contact General Neurology

## 2021-03-09 NOTE — Progress Notes (Signed)
Speech Language Pathology Treatment: Dysphagia  Patient Details Name: [name] MRN: [id number] DOB: Sep 16, 1930 Today's Date: 03/09/2021 Time: 1215-1230 SLP Time Calculation (min) (ACUTE ONLY): 15 min  Assessment / Plan / Recommendation Clinical Impression  Reviewed results and recommendations from yesterday's MBS with [name] and his daughter, including the mechanism for dysphagia after stroke, his modified liquids (nectar) and their function as a temporary measure to help protect against aspiration during his acute hospitalization.  Pt consumed small boluses of ice chips (1-2), intentionally masticating on left side of mouth and generating an effortful swallow x 5.  Encouraged to have ice chips occasionally outside of meals (not during other PO intake) and to work on effortful swallow when alone in room.  Pt verbalized understanding. SLP will follow for dysphagia therapy.   HPI HPI: Patient is an 87 y.o. male with PMH: Alzheimer's dementia, DM-2, essential HTN, stage IIIb chronic kidney disease who was admitted to [facility] on 12/4 with acute ischemic CVA after presenting from home to [facility] ED c/o left sided weakness. CXR negative for any active processes, CT head negative for acute intracranial abnormality but MRI brain showing small foci of acute ischemia within ventral right pons and left forntal periventricular white matter; no hemorrhage or mass; diffuse, severe atrophy worst in frontal and parietal lobes.      SLP Plan  Continue with current plan of care      Recommendations for follow up therapy are one component of a multi-disciplinary discharge planning process, led by the attending physician.  Recommendations may be updated based on patient status, additional functional criteria and insurance authorization.    Recommendations  Diet recommendations: Dysphagia 3 (mechanical soft);Nectar-thick liquid;Other(comment) (may have ice chips between meals) Liquids provided via:  Cup;Straw Medication Administration: Whole meds with puree Supervision: Staff to assist with self feeding;Patient able to self feed Compensations: Minimize environmental distractions;Slow rate;Small sips/bites Postural Changes and/or Swallow Maneuvers: Seated upright 90 degrees                Oral Care Recommendations: Oral care BID;Staff/trained caregiver to provide oral care Follow Up Recommendations: Acute inpatient rehab (3hours/day) Assistance recommended at discharge: Frequent or constant Supervision/Assistance SLP Visit Diagnosis: Dysphagia, oropharyngeal phase (R13.12) Plan: Continue with current plan of care                     Dola Lunsford L. Tivis Ringer, [place] CCC/SLP Acute Rehabilitation Services Office number [phone] Pager [phone]   [name]  03/09/2021, 2:41 PM

## 2021-03-10 LAB — BASIC METABOLIC PANEL
Anion gap: 10 (ref 5–15)
BUN: 23 mg/dL (ref 8–23)
CO2: 24 mmol/L (ref 22–32)
Calcium: 8.6 mg/dL — ABNORMAL LOW (ref 8.9–10.3)
Chloride: 104 mmol/L (ref 98–111)
Creatinine, Ser: 1.54 mg/dL — ABNORMAL HIGH (ref 0.61–1.24)
GFR, Estimated: 43 mL/min — ABNORMAL LOW (ref >60)
Glucose, Bld: 186 mg/dL — ABNORMAL HIGH (ref 70–99)
Potassium: 4.7 mmol/L (ref 3.5–5.1)
Sodium: 138 mmol/L (ref 135–145)

## 2021-03-10 LAB — GLUCOSE, CAPILLARY
Glucose-Capillary: 211 mg/dL — ABNORMAL HIGH (ref 70–99)
Glucose-Capillary: 240 mg/dL — ABNORMAL HIGH (ref 70–99)
Glucose-Capillary: 172 mg/dL — ABNORMAL HIGH (ref 70–99)
Glucose-Capillary: 224 mg/dL — ABNORMAL HIGH (ref 70–99)

## 2021-03-10 LAB — CBC
HCT: 36.4 % — ABNORMAL LOW (ref 39.0–52.0)
Hemoglobin: 12.3 g/dL — ABNORMAL LOW (ref 13.0–17.0)
MCH: 33.7 pg (ref 26.0–34.0)
MCHC: 33.8 g/dL (ref 30.0–36.0)
MCV: 99.7 fL (ref 80.0–100.0)
Platelets: 113 10*3/uL — ABNORMAL LOW (ref 150–400)
RBC: 3.65 MIL/uL — ABNORMAL LOW (ref 4.22–5.81)
RDW: 12.2 % (ref 11.5–15.5)
WBC: 6.6 10*3/uL (ref 4.0–10.5)
nRBC: 0 % (ref 0.0–0.2)

## 2021-03-10 LAB — FOLATE: Folate: 12.9 ng/mL (ref >5.9)

## 2021-03-10 LAB — TSH: TSH: 1.789 u[IU]/mL (ref 0.350–4.500)

## 2021-03-10 LAB — FERRITIN: Ferritin: 42 ng/mL (ref 24–336)

## 2021-03-10 LAB — RETICULOCYTES
Immature Retic Fract: 18 % — ABNORMAL HIGH (ref 2.3–15.9)
RBC.: 3.6 MIL/uL — ABNORMAL LOW (ref 4.22–5.81)
Retic Count, Absolute: 87.5 10*3/uL (ref 19.0–186.0)
Retic Ct Pct: 2.4 % (ref 0.4–3.1)

## 2021-03-10 LAB — IRON AND TIBC
Iron: 77 ug/dL (ref 45–182)
Saturation Ratios: 23 % (ref 17.9–39.5)
TIBC: 342 ug/dL (ref 250–450)
UIBC: 265 ug/dL

## 2021-03-10 LAB — VITAMIN B12: Vitamin B-12: 179 pg/mL — ABNORMAL LOW (ref 180–914)

## 2021-03-10 MED ORDER — INSULIN GLARGINE-YFGN 100 UNIT/ML ~~LOC~~ SOLN
8.0000 [IU] | Freq: Every day | SUBCUTANEOUS | Status: DC
  Administered 2021-03-10 – 2021-03-11 (×2): 8 [IU] via SUBCUTANEOUS
  Filled 2021-03-10 (×2): qty 0.08

## 2021-03-10 MED ORDER — FERROUS SULFATE 325 (65 FE) MG PO TABS
325.0000 mg | ORAL_TABLET | Freq: Every day | ORAL | Status: DC
  Administered 2021-03-11 – 2021-03-12 (×2): 325 mg via ORAL
  Filled 2021-03-10 (×2): qty 1

## 2021-03-10 MED ORDER — CYANOCOBALAMIN 1000 MCG/ML IJ SOLN
1000.0000 ug | Freq: Every day | INTRAMUSCULAR | Status: DC
  Administered 2021-03-10 – 2021-03-12 (×3): 1000 ug via INTRAMUSCULAR
  Filled 2021-03-10 (×3): qty 1

## 2021-03-10 MED ORDER — POLYETHYLENE GLYCOL 3350 17 G PO PACK
17.0000 g | PACK | Freq: Every day | ORAL | Status: DC
  Administered 2021-03-10 – 2021-03-12 (×3): 17 g via ORAL
  Filled 2021-03-10 (×3): qty 1

## 2021-03-10 MED ORDER — SENNOSIDES-DOCUSATE SODIUM 8.6-50 MG PO TABS
2.0000 | ORAL_TABLET | Freq: Two times a day (BID) | ORAL | Status: DC
  Administered 2021-03-10 – 2021-03-12 (×4): 2 via ORAL
  Filled 2021-03-10 (×4): qty 2

## 2021-03-10 NOTE — Progress Notes (Deleted)
Subjective:   [name] is a 87 y.o. male who presents for Medicare Annual/Subsequent preventive examination.  I connected with [name] today by telephone and verified that I am speaking with the correct person using two identifiers. Location patient: home Location provider: work Persons participating in the virtual visit: patient, Marine scientist.    I discussed the limitations, risks, security and privacy concerns of performing an evaluation and management service by telephone and the availability of in person appointments. I also discussed with the patient that there may be a patient responsible charge related to this service. The patient expressed understanding and verbally consented to this telephonic visit.    Interactive audio and video telecommunications were attempted between this provider and patient, however failed, due to patient having technical difficulties OR patient did not have access to video capability.  We continued and completed visit with audio only.  Some vital signs may be absent or patient reported.   Time Spent with patient on telephone encounter: *** minutes  Review of Systems           Objective:    There were no vitals filed for this visit. There is no height or weight on file to calculate BMI.  Advanced Directives 03/09/2021 05/18/2020 09/03/2018 06/25/2018 08/30/2017 08/15/2016 07/30/2015  Does Patient Have a Medical Advance Directive? No No Yes No Yes Yes Yes  Type of Advance Directive - Public librarian;Living will - [place];Living will [place];Living will [place];Living will  Does patient want to make changes to medical advance directive? - - - - - - No - Patient declined  Copy of [place] in Chart? - - No - copy requested - No - copy requested No - copy requested No - copy requested  Would patient like information on creating a medical advance directive?  No - Patient declined No - Patient declined - No - Patient declined - - -  Pre-existing out of facility DNR order (yellow form or pink MOST form) - - - - - - -    Current Medications (verified) Facility-Administered Encounter Medications as of 03/15/2021  Medication   acetaminophen (TYLENOL) tablet 650 mg   Or   acetaminophen (TYLENOL) suppository 650 mg   aspirin EC tablet 81 mg   atorvastatin (LIPITOR) tablet 20 mg   clopidogrel (PLAVIX) tablet 75 mg   cyanocobalamin ((VITAMIN B-12)) injection 1,000 mcg   donepezil (ARICEPT) tablet 10 mg   enoxaparin (LOVENOX) injection 40 mg   food thickener (SIMPLYTHICK (NECTAR/LEVEL 2/MILDLY THICK)) 10 packet   insulin aspart (novoLOG) injection 0-9 Units   pantoprazole (PROTONIX) EC tablet 40 mg   sertraline (ZOLOFT) tablet 100 mg   Outpatient Encounter Medications as of 03/15/2021  Medication Sig   aspirin EC 81 MG tablet Take 81 mg by mouth daily as needed for moderate pain.   diphenhydrAMINE (BENADRYL) 25 MG tablet Take 25 mg by mouth daily.   donepezil (ARICEPT) 10 MG tablet TAKE 1 TABLET BY MOUTH EVERYDAY AT BEDTIME (Patient taking differently: Take 10 mg by mouth in the morning.)   glipiZIDE (GLUCOTROL) 5 MG tablet TAKE 1 TABLET BY MOUTH EVERY DAY BEFORE BREAKFAST   metFORMIN (GLUCOPHAGE-XR) 500 MG 24 hr tablet Take 500 mg by mouth in the morning and at bedtime.   Multiple Vitamins-Minerals (ICAPS LUTEIN & ZEAXANTHIN PO) Take 1 capsule by mouth in the morning and at bedtime. I - Caps   Omega-3 Fatty Acids (  FISH OIL) 1000 MG CAPS Take 1,200 mg by mouth in the morning and at bedtime.   Propylene Glycol (SYSTANE BALANCE) 0.6 % SOLN Apply 1 drop to eye 2 (two) times daily as needed (dry eyes).   sertraline (ZOLOFT) 100 MG tablet TAKE 1 TABLET BY MOUTH EVERY DAY    Allergies (verified) Sulfa antibiotics   History: Past Medical History:  Diagnosis Date   Adenocarcinoma of left lung, stage 1 ([place]) 2001   T1N0 stage I  adenoca left lung  resected 01/03/00    Alzheimer's disease ([place])    CAD (coronary artery disease) 2014   a. 09/2012 Cath: LM nl, LAD 50-60p, D1 60-70 m, D1 50ost, LCX nl, RCA min irregs, EF 55-65%.   Generalized anxiety disorder 01/08/2007   Qualifier: Diagnosis of  By: Diona Browner MD, [name]     History of colonic polyps 07/25/2011   Macular degeneration of both eyes 01/21/2014   Nonmelanoma skin cancer 07/25/2011   Multiple lesions excised face/nose   Pericarditis    a. 09/2012 with effusion and tamponade, s/p window.  b.  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 SCC IN SITU TX CX3   SCC (squamous cell carcinoma) 04/12/2005   RIGHT NECK SUP. SCC IN SITU TX EXC   SCC (squamous cell carcinoma) 04/16/2013   LEFT TEMPLE SCC IN SITU TX CX3 5FU   SCC (squamous cell carcinoma) 04/16/2013   BELOW LEFT EYE SCC IN SITU TX CX3 5FU   SCC (squamous cell carcinoma) 04/16/2013   BELOW RIGHT EYE SCC IN SITU TX CX3 5FU   SCC (squamous cell carcinoma) 08/04/2014   LEFT CHEEK SCC IN SITU TX CX3 5FU   SCC (squamous cell carcinoma) 11/27/2018   LEFT TEMPLE SCC IN SITU TX WITH BX   SCC (squamous cell carcinoma)    SCC (squamous cell carcinoma)    SCC (squamous cell carcinoma)    SCC (squamous cell carcinoma) Well Diff 09/13/2016   Tip of Nose SCC WELL DIFF TX (MOH's), and RIGHT INNER EYE SUP. 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/21/2007   Qualifier: Diagnosis of  By: Diona Browner MD, [name]     Type 2 DM with CKD stage 3 and hypertension ([place]) 07/16/2015   Unspecified essential hypertension    Past Surgical History:  Procedure Laterality Date   HERNIA REPAIR     LEFT HEART CATHETERIZATION WITH CORONARY ANGIOGRAM N/A 09/13/2012   Procedure: LEFT HEART CATHETERIZATION WITH CORONARY ANGIOGRAM;  Surgeon: Sherren Mocha, MD;  Location[facility] C[facility] LAB;  Service: Cardiovascular;  Laterality: N/A;   LOBECTOMY  2001   upper left   PERICARDIAL TAP N/A 09/16/2012   Procedure: PERICARDIAL TAP;  Surgeon: Sinclair Grooms, MD;  Location: [facility] CATH LAB;  Service: Cardiovascular;  Laterality: N/A;   PROSTATECTOMY  2003   SUBXYPHOID PERICARDIAL WINDOW N/A 09/16/2012   Procedure: SUBXYPHOID PERICARDIAL WINDOW;  Surgeon: Rexene Alberts, MD;  Location: [facility] OR;  Service: Thoracic;  Laterality: N/A;    TONSILLECTOMY     Family History  Problem Relation Age of Onset   Cancer Father        colon   Dementia Mother    Diabetes Mother    Cancer Brother        lung   Social History   Socioeconomic History   Marital status: Married    Spouse name: Not on file   Number of children: Not on file   Years of education: Not on file   Highest education level: Not on file  Occupational History   Occupation: retired  Tobacco Use   Smoking status: Former    Packs/day: 2.00    Years: 42.00    Pack years: 84.00    Types: Cigarettes    Start date: 02/25/1953    Quit date: 04/04/1984    Years since quitting: 36.9   Smokeless tobacco: Former    Types: Chew    Quit date: 07/27/1986   Tobacco comments:    quit tobacco 26 years ago  Vaping Use   Vaping Use: Never used  Substance and Sexual Activity   Alcohol use: No    Alcohol/week: 0.0 standard drinks   Drug use: No   Sexual activity: Not Currently  Other Topics Concern   Not on file  Social History Narrative   Lives in [place], [place]   Regular exercise--no, mowing grass   Diet: fruit and veggies   Social Determinants of Health   Financial Resource Strain: Low Risk    Difficulty of Paying Living Expenses: Not very hard  Food Insecurity: Not on file  Transportation Needs: Not on file  Physical Activity: Not on file  Stress: Not on file  Social Connections: Not on file    Tobacco Counseling Counseling given: Not Answered Tobacco comments: quit tobacco 26 years ago   Clinical Intake:                 Diabetes:  Is the patient diabetic?  Yes  If diabetic, was a CBG obtained today?  No , visit completed over the phone.  Did the  patient bring in their glucometer from home?  No , visit completed over the phone. How often do you monitor your CBG's? ***.   Financial Strains and Diabetes Management:  Are you having any financial strains with the device, your supplies or your medication? {YES/NO:21197}.  Does the  patient want to be seen by Chronic Care Management for management of their diabetes?  {YES/NO:21197} Would the patient like to be referred to a Nutritionist or for Diabetic Management?  {YES/NO:21197}  Diabetic Exams:  Diabetic Eye Exam: Completed ***. Overdue for diabetic eye exam. Pt has been advised about the importance in completing this exam. A referral has been placed today. Message sent to referral coordinator for scheduling purposes. Advised pt to expect a call from our office re: appt.  Diabetic Foot Exam: Completed 10/01/20.          Activities of Daily Living In your present state of health, do you have any difficulty performing the following activities: 03/09/2021 03/09/2021  Hearing? - N  Vision? - N  Difficulty concentrating or making decisions? - N  Walking or climbing stairs? - N  Dressing or bathing? - N  Doing errands, shopping? N -  Some recent data might be hidden    Patient Care Team: [name], MD as PCP - General (Family Medicine) Sherren Mocha, MD as PCP - Cardiology (Cardiology) [name], [facility] as Pharmacist (Pharmacist) Lavonna Monarch, MD as Consulting Physician (Dermatology)  Indicate any recent Medical Services you may have received from other than [facility] providers in the past year (date may be approximate).     Assessment:   This is a routine wellness examination for [place].  Hearing/Vision screen No results found.  Dietary issues and exercise activities discussed:     Goals Addressed   None    Depression Screen PHQ 2/9 Scores 12/26/2019 09/03/2018 08/30/2017 08/15/2016 01/15/2015  PHQ - 2 Score 1 0 0 0 0  PHQ- 9 Score - 0 0 - -    Fall Risk Fall Risk  12/26/2019 09/03/2018 08/30/2017 08/15/2016 01/15/2015  Falls in the past year? 0 0 No No No  Number falls in past yr: 0 - - - -    FALL RISK PREVENTION PERTAINING TO THE HOME:  Any stairs in or around the home? {YES/NO:21197} If so, are there any without handrails?  {YES/NO:21197} Home free of loose throw rugs in walkways, pet beds, electrical cords, etc? {YES/NO:21197} Adequate lighting in your home to reduce risk of falls? {YES/NO:21197}  ASSISTIVE DEVICES UTILIZED TO PREVENT FALLS:  Life alert? {YES/NO:21197} Use of a cane, walker or w/c? {YES/NO:21197} Grab bars in the bathroom? {YES/NO:21197} Shower chair or bench in shower? {YES/NO:21197} Elevated toilet seat or a handicapped toilet? {YES/NO:21197}  TIMED UP AND GO:  Was the test performed? No , visit completed over the phone.    Cognitive Function: MMSE - Mini Mental State Exam 09/03/2018 08/30/2017 08/15/2016  Not completed: (No Data) (No Data) -  Orientation to time - - 5  Orientation to Place - - 5  Registration - - 3  Attention/ Calculation - - 0  Recall - - 3  Language- name 2 objects - - 0  Language- repeat - - 1  Language- follow 3 step command - - 3  Language- read & follow direction - - 0  Write a sentence - - 0  Copy design - - 0  Total score - - 20        Immunizations Immunization History  Administered Date(s) Administered  Fluad Quad(high Dose 65+) 12/26/2019   Influenza Whole 01/03/2007   Influenza, High Dose Seasonal PF 01/19/2018, 03/06/2019   Influenza,inj,Quad PF,6+ Mos 02/06/2013, 01/20/2014, 01/15/2015, 01/29/2016   Influenza-Unspecified 01/02/2017   Moderna Sars-Covid-2 Vaccination 06/03/2019, 07/04/2019, 03/19/2020   Pneumococcal Conjugate-13 01/20/2014   Pneumococcal Polysaccharide-23 01/02/2001   Td 04/04/1994   Tdap 01/25/2013    TDAP status: Up to date  {Flu Vaccine status:2101806}  Pneumococcal vaccine status: Up to date  {Covid-19 vaccine status:2101808}  Qualifies for Shingles Vaccine? Yes   Zostavax completed No   {Shingrix Completed?:2101804}  Screening Tests Health Maintenance  Topic Date Due   Zoster Vaccines- Shingrix (1 of 2) Never done   OPHTHALMOLOGY EXAM  04/23/2020   COVID-19 Vaccine (4 - Booster for Moderna series)  05/14/2020   INFLUENZA VACCINE  11/02/2020   URINE MICROALBUMIN  12/19/2020   HEMOGLOBIN A1C  09/06/2021   FOOT EXAM  10/01/2021   TETANUS/TDAP  01/26/2023   Pneumonia Vaccine 13+ Years old  Completed   HPV VACCINES  Aged Out    Health Maintenance  Health Maintenance Due  Topic Date Due   Zoster Vaccines- Shingrix (1 of 2) Never done   OPHTHALMOLOGY EXAM  04/23/2020   COVID-19 Vaccine (4 - Booster for Moderna series) 05/14/2020   INFLUENZA VACCINE  11/02/2020   URINE MICROALBUMIN  12/19/2020    Colorectal cancer screening: No longer required.   Lung Cancer Screening: (Low Dose CT Chest recommended if Age 48-80 years, 30 pack-year currently smoking OR have quit w/in 15years.) does qualify.   Lung Cancer Screening Referral: ***  Additional Screening:  Hepatitis C Screening: does not qualify  Vision Screening: Recommended annual ophthalmology exams for early detection of glaucoma and other disorders of the eye. Is the patient up to date with their annual eye exam?  {YES/NO:21197} Who is the provider or what is the name of the office in which the patient attends annual eye exams? *** If pt is not established with a provider, would they like to be referred to a provider to establish care? {YES/NO:21197}.   Dental Screening: Recommended annual dental exams for proper oral hygiene  Community Resource Referral / Chronic Care Management: CRR required this visit?  {YES/NO:21197}  CCM required this visit?  {YES/NO:21197}     Plan:     I have personally reviewed and noted the following in the patient's chart:   Medical and social history Use of alcohol, tobacco or illicit drugs  Current medications and supplements including opioid prescriptions. {Opioid Prescriptions:[id number]} Functional ability and status Nutritional status Physical activity Advanced directives List of other physicians Hospitalizations, surgeries, and ER visits in previous 12  months Vitals Screenings to include cognitive, depression, and falls Referrals and appointments  In addition, I have reviewed and discussed with patient certain preventive protocols, quality metrics, and best practice recommendations. A written personalized care plan for preventive services as well as general preventive health recommendations were provided to patient.   Due to this being a telephonic visit, the after visit summary with patients personalized plan was offered to patient via mail or my-chart. ***Patient declined at this time./ Patient would like to access on my-chart/ per request, patient was mailed a copy of AVS./ Patient preferred to pick up at office at next visit.   Loma Messing, LPN   [id number]   Nurse Health Advisor   Nurse Notes: none

## 2021-03-10 NOTE — Progress Notes (Signed)
[facility] PROGRESS NOTE   [name]:[id number] DOB: Jun 18, 1930 DOA: 03/07/2021  PCP: [name], MD  Brief History/Interval Summary: [name] is a 87 y.o. male with medical history significant for Alzheimer dementia, type 2 diabetes mellitus, essential hypertension, stage IIIb chronic kidney disease with baseline creatinine 1.5-1.9,  who is admitted to [facility] on 03/07/2021 with acute ischemic CVA after presenting from home to [facility] ED complaining of left-sided weakness.    Consultants: Neurology  Procedures: Transthoracic echocardiogram.  Carotid Doppler.    Subjective/Interval History: Patient remains pleasantly confused.  Continues to have left-sided weakness though he mentioned that he is able to move his left arm little bit more today compared to yesterday.       Assessment/Plan:  Acute stroke Patient with left-sided hemiparesis and facial droop.  MRI confirmed stroke.  Seen by neurology. LDL is 83.  HbA1c 8.3.  Patient is on atorvastatin. Echocardiogram shows normal systolic function with grade 1 diastolic dysfunction.  No embolic source identified.   No significant stenosis noted on carotid Doppler. Physical therapy.  Inpatient rehabilitation is recommended.   Speech therapy evaluation.  Seen by speech therapy.  On dysphagia 3 diet with nectar thick liquids. Neurology recommended aspirin and Plavix for 3 weeks followed by aspirin alone. Allowing permissive hypertension.    History of dementia Pleasantly confused.  Continue Aricept.  Baseline functional status is not entirely clear.  Acute on chronic kidney disease stage IIIb Old labs reviewed.  Creatinine has been fluctuating over the last several months occasionally as high as 1.8 and as low as 0.9.   Presented with creatinine of 1.3.  Went up slightly to 1.6.  Noted to be better today.  This could all be just his chronic kidney disease.  We will stop IV fluids.  Encourage oral  intake.  Avoid nephrotoxic agents.  Monitor urine output   Macrocytic anemia/vitamin B12 deficiency Anemia panel showed a B12 level of 179.  Will start supplementation Ferritin was 42 with a TIBC of 342 and iron of 77.  There could be an element of iron deficiency as well.  Start iron supplementation as well.   TSH was normal.   Hemoglobin stable.    Diabetes mellitus type 2, uncontrolled with hyperglycemia Patient on glipizide at home.  HbA1c is 8.3.   Basal insulin to be initiated.  Continue SSI.  Mild thrombocytopenia Monitor periodically.  Stable.  History of generalized anxiety disorder Continue Zoloft.   DVT Prophylaxis: Initiate Lovenox Code Status: Full code Family Communication:  No family at bedside Disposition Plan: Inpatient rehabilitation is recommended.  Status is: Inpatient  Remains inpatient appropriate because: Acute stroke, need for rehabilitation      Medications: Scheduled:  aspirin EC  81 mg Oral Daily   atorvastatin  20 mg Oral q1800   clopidogrel  75 mg Oral Daily   cyanocobalamin  1,000 mcg Intramuscular Daily   donepezil  10 mg Oral QHS   enoxaparin (LOVENOX) injection  40 mg Subcutaneous Q24H   insulin aspart  0-9 Units Subcutaneous TID WC   insulin glargine-yfgn  8 Units Subcutaneous Daily   pantoprazole  40 mg Oral Daily   sertraline  100 mg Oral Daily   Continuous:  EXB:[id number] **OR** acetaminophen, food thickener  Antibiotics: Anti-infectives (From admission, onward)    None       Objective:  Vital Signs  Vitals:   03/09/21 1622 03/10/21 0000 03/10/21 0400 03/10/21 0720  BP: (!) 160/78 (!) 166/79  139/67 (!) 170/83  Pulse: 80 70 63 67  Resp:   18 20  Temp: 98.5 F (36.9 C) 98.7 F (37.1 C) 98.5 F (36.9 C) 98 F (36.7 C)  TempSrc: Oral Oral Oral Oral  SpO2: 95% 100% 95% 95%  Weight:      Height:        Intake/Output Summary (Last 24 hours) at 03/10/2021 0947 Last data filed at 03/10/2021 0530 Gross per 24  hour  Intake 1504.37 ml  Output 250 ml  Net 1254.37 ml    Filed Weights   03/07/21 1801  Weight: 82.6 kg    General appearance: Awake alert.  In no distress.  Pleasantly confused Resp: Clear to auscultation bilaterally.  Normal effort Cardio: S1-S2 is normal regular.  No S3-S4.  No rubs murmurs or bruit GI: Abdomen is soft.  Nontender nondistended.  Bowel sounds are present normal.  No masses organomegaly Extremities: No edema.  Full range of motion of lower extremities. Neurologic: left hemiparesis    Lab Results:  Data Reviewed: I have personally reviewed following labs and imaging studies  CBC: Recent Labs  Lab 03/07/21 1928 03/08/21 0553 03/09/21 0037 03/10/21 0057  WBC 7.6 6.2 6.6 6.6  NEUTROABS 5.4  --   --   --   HGB 13.1 11.7* 11.7* 12.3*  HCT 39.1 35.6* 34.9* 36.4*  MCV 100.8* 100.3* 100.0 99.7  PLT 121* 105* 117* 113*     Basic Metabolic Panel: Recent Labs  Lab 03/07/21 1928 03/08/21 0424 03/08/21 1037 03/09/21 0037 03/10/21 0057  NA 138 139 135 134* 138  K 5.2* 2.8* 4.6 4.6 4.7  CL 104 119* 105 103 104  CO2 25 17* 23 23 24   GLUCOSE 229* 145* 278* 231* 186*  BUN 20 14 17 22 23   CREATININE 1.50* 0.90 1.30* 1.60* 1.54*  CALCIUM 9.0 5.6* 8.7* 8.4* 8.6*  MG  --  1.4*  --   --   --      GFR: Estimated Creatinine Clearance: 33.2 mL/min (A) (by C-G formula based on SCr of 1.54 mg/dL (H)).  Liver Function Tests: Recent Labs  Lab 03/07/21 1928 03/08/21 0424  AST 11* 6*  ALT 11 6  ALKPHOS 81 48  BILITOT 0.6 0.6  PROT 6.9 3.8*  ALBUMIN 3.8 2.1*      Coagulation Profile: Recent Labs  Lab 03/07/21 1928  INR 1.1      HbA1C: Recent Labs    03/08/21 0553  HGBA1C 8.3*     CBG: Recent Labs  Lab 03/09/21 0731 03/09/21 1310 03/09/21 1623 03/09/21 2108 03/10/21 0724  GLUCAP 253* 219* 209* 209* 211*     Lipid Profile: Recent Labs    03/08/21 0424  CHOL 122  HDL 17*  LDLCALC 83  TRIG 109  CHOLHDL 7.2       Recent Results (from the past 240 hour(s))  Resp Panel by RT-PCR (Flu A&B, Covid) Nasopharyngeal Swab     Status: None   Collection Time: 03/07/21  7:24 PM   Specimen: Nasopharyngeal Swab; Nasopharyngeal(NP) swabs in vial transport medium  Result Value Ref Range Status   SARS Coronavirus 2 by RT PCR NEGATIVE NEGATIVE Final    Comment: (NOTE) SARS-CoV-2 target nucleic acids are NOT DETECTED.  The SARS-CoV-2 RNA is generally detectable in upper respiratory specimens during the acute phase of infection. The lowest concentration of SARS-CoV-2 viral copies this assay can detect is 138 copies/mL. A negative result does not preclude SARS-Cov-2 infection and should not be used  as the sole basis for treatment or other patient management decisions. A negative result may occur with  improper specimen collection/handling, submission of specimen other than nasopharyngeal swab, presence of viral mutation(s) within the areas targeted by this assay, and inadequate number of viral copies(<138 copies/mL). A negative result must be combined with clinical observations, patient history, and epidemiological information. The expected result is Negative.  Fact Sheet for Patients:  EntrepreneurPulse.com.au  Fact Sheet for Healthcare Providers:  IncredibleEmployment.be  This test is no t yet approved or cleared by the [place] FDA and  has been authorized for detection and/or diagnosis of SARS-CoV-2 by FDA under an Emergency Use Authorization (EUA). This EUA will remain  in effect (meaning this test can be used) for the duration of the COVID-19 declaration under Section 564(b)(1) of the Act, 21 U.S.C.section 360bbb-3(b)(1), unless the authorization is terminated  or revoked sooner.       Influenza A by PCR NEGATIVE NEGATIVE Final   Influenza B by PCR NEGATIVE NEGATIVE Final    Comment: (NOTE) The Xpert Xpress SARS-CoV-2/FLU/RSV plus assay is intended  as an aid in the diagnosis of influenza from Nasopharyngeal swab specimens and should not be used as a sole basis for treatment. Nasal washings and aspirates are unacceptable for Xpert Xpress SARS-CoV-2/FLU/RSV testing.  Fact Sheet for Patients: EntrepreneurPulse.com.au  Fact Sheet for Healthcare Providers: IncredibleEmployment.be  This test is not yet approved or cleared by the [place] FDA and has been authorized for detection and/or diagnosis of SARS-CoV-2 by FDA under an Emergency Use Authorization (EUA). This EUA will remain in effect (meaning this test can be used) for the duration of the COVID-19 declaration under Section 564(b)(1) of the Act, 21 U.S.C. section 360bbb-3(b)(1), unless the authorization is terminated or revoked.  Performed at [facility], [address], [place], [place] [address]        Radiology Studies: DG Swallowing Func-Speech Pathology  Result Date: 03/08/2021 Table formatting from the original result was not included. Objective Swallowing Evaluation: Type of Study: MBS-Modified Barium Swallow Study  Patient Details Name: TREMANE SPURGEON MRN: [id number] Date of Birth: [birth year] Today's Date: 03/08/2021 Time: SLP Start Time (ACUTE ONLY): 38 -SLP Stop Time (ACUTE ONLY): 1320 SLP Time Calculation (min) (ACUTE ONLY): 20 min Past Medical History: Past Medical History: Diagnosis Date  Adenocarcinoma of left lung, stage 1 ([place]) 2001  T1N0 stage I  adenoca left lung resected 01/03/00   Alzheimer's disease ([place])   CAD (coronary artery disease) 2014  a. 09/2012 Cath: LM nl, LAD 50-60p, D1 60-70 m, D1 50ost, LCX nl, RCA min irregs, EF 55-65%.  Generalized anxiety disorder 01/08/2007  Qualifier: Diagnosis of  By: Diona Browner MD, [name]    History of colonic polyps 07/25/2011  Macular degeneration of both eyes 01/21/2014  Nonmelanoma skin cancer 07/25/2011  Multiple lesions excised face/nose  Pericarditis   a. 09/2012 with effusion and  tamponade, s/p window.  b.  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 SCC IN SITU TX CX3  SCC (squamous cell carcinoma) 04/12/2005  RIGHT NECK SUP. SCC IN SITU TX EXC  SCC (squamous cell carcinoma) 04/16/2013  LEFT TEMPLE SCC IN SITU TX CX3 5FU  SCC (squamous cell carcinoma) 04/16/2013  BELOW LEFT EYE SCC IN SITU TX CX3 5FU  SCC (squamous cell carcinoma) 04/16/2013  BELOW RIGHT EYE SCC IN SITU TX CX3 5FU  SCC (squamous cell carcinoma) 08/04/2014  LEFT CHEEK SCC IN SITU TX CX3 5FU  SCC (squamous cell carcinoma) 11/27/2018  LEFT TEMPLE SCC IN SITU TX WITH BX  SCC (squamous cell carcinoma)   SCC (squamous cell carcinoma)   SCC (squamous cell carcinoma)   SCC (squamous cell carcinoma) Well Diff 09/13/2016  Tip of Nose SCC WELL DIFF TX (MOH's), and RIGHT INNER EYE SUP. SCC IN SITU TX TO WATCH  SCC (squamous cell carcinoma) Well Diff 05/30/2017  Right Cheekbone (Cx3,5FU) and Under Left Eye (Cx3,5FU)  Squamous cell carcinoma in situ (SCCIS) 04/12/2005  Right Neck Inf (Cx3), Right Neck Sup (Exc), and Bulb of Nose (Cx3,Exc)  Squamous cell carcinoma in situ (SCCIS) 09/27/2005  Nose (Cx3,Exc)  Squamous cell carcinoma in situ (SCCIS) 02/18/2013  Below Left Eye (tx p bx)  Squamous cell carcinoma in situ (SCCIS) 04/16/2013  Left Temple (Cx3,5FU), Below Left Eye (Cx3,5FU), Below Right Eye (Cx3,5FU)  Squamous cell carcinoma in situ (SCCIS)  08/04/2014  Left Cheek (Cx3,5FU)  Squamous cell carcinoma in situ (SCCIS) 09/13/2016  Right Inner Eye Sup. 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  Type 2 diabetes mellitus with vascular disease ([place]) 05/21/2007  Qualifier: Diagnosis of  By: Diona Browner MD, [name]    Type 2 DM with CKD stage 3 and hypertension ([place]) 07/16/2015  Unspecified essential hypertension  Past Surgical History: Past Surgical History: Procedure Laterality Date  HERNIA REPAIR    LEFT HEART CATHETERIZATION WITH CORONARY ANGIOGRAM N/A 09/13/2012  Procedure: LEFT HEART CATHETERIZATION WITH CORONARY ANGIOGRAM;  Surgeon: Sherren Mocha, MD;  Location: [facility] CATH LAB;  Service: Cardiovascular;  Laterality: N/A;  LOBECTOMY  2001  upper left  PERICARDIAL TAP N/A 09/16/2012  Procedure: PERICARDIAL TAP;  Surgeon: Sinclair Grooms, MD;  Location: [facility] CATH LAB;  Service: Cardiovascular;  Laterality: N/A;  PROSTATECTOMY  2003  SUBXYPHOID PERICARDIAL WINDOW N/A 09/16/2012  Procedure: SUBXYPHOID PERICARDIAL WINDOW;  Surgeon: Rexene Alberts, MD;  Location: [facility] OR;  Service: Thoracic;  Laterality: N/A;  TONSILLECTOMY   HPI: Patient is an 87 y.o. male with PMH: Alzheimer's dementia, DM-2, essential HTN, stage IIIb chronic kidney disease who was admitted to [facility] on 12/4 with acute ischemic CVA after presenting from home to [facility] ED c/o left sided weakness. CXR negative for any active processes, CT head negative for acute intracranial abnormality but MRI brain showing small foci of acute ischemia within ventral right pons and left forntal periventricular white matter; no hemorrhage or mass; diffuse, severe atrophy worst in frontal and parietal lobes.  Subjective: pleasant, alert, sitting in chair in  radiology suite  Recommendations for follow up therapy are one component of  a multi-disciplinary discharge planning process, led by the attending physician.  Recommendations may be updated based on patient status, additional functional criteria and insurance authorization. Assessment / Plan / Recommendation Clinical Impressions 03/08/2021 Clinical Impression Patient presents with a mild oropharyngeal dysphagia as per this MBS. He exhibited mildly delayed mastication of regular solids and reduced anterior to posterior transit of puree solids, barium tablet and regular solids. Swallow was initiated at level of vallecular sinus with puree solids, nectar thick liquids and regular solids and initiated at level of almost at pyriform sinus with thin liquids. Silent, trace aspiration occured during the swallow with each small sip of thin liquids (PAS 8) secondary to reduced airway protection. No aspiration or penetration observed with nectar thick liquids even with straw sips and taking barium tablet with nectar thick liquid sip. No significant amount of pharyngeal residuals remained post initial swallows. Appearance of prominent cricopharyngeal bar as well as questionable cervical osteophytes ( no radiologist present to confirm) resulted in mildly reduced transit but no retention of barium. No esophageal backflow observed and esophageal sweep did not reveal anything significant. SLP is recommending Dys 3 solids, nectar thick liquids at this time. SLP Visit Diagnosis Dysphagia, unspecified (R13.10) Attention and concentration deficit following -- Frontal lobe and executive function deficit following -- Impact on safety and function Mild aspiration risk;Moderate aspiration risk   Treatment Recommendations 03/08/2021 Treatment Recommendations Therapy as outlined in treatment plan below   Prognosis 03/08/2021 Prognosis for Safe Diet Advancement Good Barriers to Reach Goals Cognitive deficits Barriers/Prognosis Comment h/o  Alzheimer's dementia Diet Recommendations 03/08/2021 SLP Diet Recommendations Dysphagia 3 (Mech soft) solids;Nectar thick liquid Liquid Administration via Cup;Straw Medication Administration Whole meds with liquid Compensations Minimize environmental distractions;Slow rate;Small sips/bites Postural Changes --   Other Recommendations 03/08/2021 Recommended Consults -- Oral Care Recommendations Oral care BID Other Recommendations Order thickener from pharmacy;Prohibited food (jello, ice cream, thin soups);Remove water pitcher;Clarify dietary restrictions Follow Up Recommendations Acute inpatient rehab (3hours/day) Assistance recommended at discharge Frequent or constant Supervision/Assistance Functional Status Assessment Patient has had a recent decline in their functional status and demonstrates the ability to make significant improvements in function in a reasonable and predictable amount of time. Frequency and Duration  03/08/2021 Speech Therapy Frequency (ACUTE ONLY) min 2x/week Treatment Duration 1 week   Oral Phase 03/08/2021 Oral Phase Impaired Oral - Pudding Teaspoon -- Oral - Pudding Cup -- Oral - Honey Teaspoon -- Oral - Honey Cup -- Oral - Nectar Teaspoon -- Oral - Nectar Cup WFL Oral - Nectar Straw WFL Oral - Thin Teaspoon -- Oral - Thin Cup WFL Oral - Thin Straw -- Oral - Puree Delayed oral transit;Reduced posterior propulsion Oral - Mech Soft -- Oral - Regular Impaired mastication;Delayed oral transit;Reduced posterior propulsion Oral - Multi-Consistency -- Oral - Pill Delayed oral transit;Decreased bolus cohesion;Reduced posterior propulsion Oral Phase - Comment --  Pharyngeal Phase 03/08/2021 Pharyngeal Phase Impaired Pharyngeal- Pudding Teaspoon -- Pharyngeal -- Pharyngeal- Pudding Cup -- Pharyngeal -- Pharyngeal- Honey Teaspoon -- Pharyngeal -- Pharyngeal- Honey Cup -- Pharyngeal -- Pharyngeal- Nectar Teaspoon -- Pharyngeal -- Pharyngeal- Nectar Cup Delayed swallow initiation-vallecula Pharyngeal --  Pharyngeal- Nectar Straw Delayed swallow initiation-vallecula Pharyngeal -- Pharyngeal- Thin Teaspoon -- Pharyngeal -- Pharyngeal- Thin Cup Delayed swallow initiation-vallecula;Delayed swallow initiation-pyriform sinuses;Reduced airway/laryngeal closure;Penetration/Aspiration during swallow;Trace aspiration Pharyngeal Material enters airway, passes BELOW cords without attempt by patient to eject out (silent aspiration) Pharyngeal- Thin Straw -- Pharyngeal -- Pharyngeal- Puree Delayed swallow initiation-vallecula Pharyngeal -- Pharyngeal- Mechanical Soft -- Pharyngeal -- Pharyngeal-  Regular Delayed swallow initiation-vallecula Pharyngeal -- Pharyngeal- Multi-consistency -- Pharyngeal -- Pharyngeal- Pill WFL Pharyngeal -- Pharyngeal Comment --  Cervical Esophageal Phase  03/08/2021 Cervical Esophageal Phase Impaired Pudding Teaspoon -- Pudding Cup -- Honey Teaspoon -- Honey Cup -- Nectar Teaspoon -- Nectar Cup Prominent cricopharyngeal segment Nectar Straw Prominent cricopharyngeal segment Thin Teaspoon -- Thin Cup Prominent cricopharyngeal segment Thin Straw -- Puree Prominent cricopharyngeal segment Mechanical Soft -- Regular Prominent cricopharyngeal segment Multi-consistency -- Pill Prominent cricopharyngeal segment Cervical Esophageal Comment -- Sonia Baller, MA, CCC-SLP Speech Therapy                     ECHOCARDIOGRAM COMPLETE  Result Date: 03/08/2021    ECHOCARDIOGRAM REPORT   Patient Name:   EYDEN DOBIE Date of Exam: 03/08/2021 Medical Rec #:  [id number]        Height:       69.0 in Accession #:    [id number]       Weight:       182.0 lb Date of Birth:  June 25, 1930        BSA:          1.985 m Patient Age:    73 years         BP:           171/84 mmHg Patient Gender: M                HR:           72 bpm. Exam Location:  Inpatient Procedure: 2D Echo, Cardiac Doppler and Color Doppler Indications:    stroke  History:        Patient has no prior history of Echocardiogram examinations.                  Stroke; Risk Factors:Diabetes and Hypertension.  Sonographer:    Melissa Morford RDCS (AE, PE) Referring Phys: [id number] Rhetta Mura  Sonographer Comments: Technically difficult study due to poor echo windows. IMPRESSIONS  1. Left ventricular ejection fraction, by estimation, is 50 to 55%. The left ventricle has low normal function. The left ventricle has no regional wall motion abnormalities. There is moderate asymmetric left ventricular hypertrophy of the basal-septal segment. Left ventricular diastolic parameters are consistent with Grade I diastolic dysfunction (impaired relaxation).  2. Right ventricular systolic function is mildly reduced. The right ventricular size is normal.  3. The mitral valve is normal in structure. No evidence of mitral valve regurgitation. No evidence of mitral stenosis.  4. The aortic valve is tricuspid. Aortic valve regurgitation is trivial. No aortic stenosis is present.  5. The inferior vena cava is normal in size with greater than 50% respiratory variability, suggesting right atrial pressure of 3 mmHg. FINDINGS  Left Ventricle: Left ventricular ejection fraction, by estimation, is 50 to 55%. The left ventricle has low normal function. The left ventricle has no regional wall motion abnormalities. The left ventricular internal cavity size was normal in size. There is moderate asymmetric left ventricular hypertrophy of the basal-septal segment. Left ventricular diastolic parameters are consistent with Grade I diastolic dysfunction (impaired relaxation). Right Ventricle: The right ventricular size is normal. No increase in right ventricular wall thickness. Right ventricular systolic function is mildly reduced. The tricuspid regurgitant velocity is 1.68 m/s, and with an assumed right atrial pressure of 3 mmHg, the estimated right ventricular systolic pressure is 89.2 mmHg. Left Atrium: Left atrial size was normal in size. Right Atrium: Right atrial size was  normal in size.  Pericardium: There is no evidence of pericardial effusion. Mitral Valve: The mitral valve is normal in structure. No evidence of mitral valve regurgitation. No evidence of mitral valve stenosis. Tricuspid Valve: The tricuspid valve is normal in structure. Tricuspid valve regurgitation is trivial. Aortic Valve: The aortic valve is tricuspid. Aortic valve regurgitation is trivial. No aortic stenosis is present. Pulmonic Valve: The pulmonic valve was grossly normal. Pulmonic valve regurgitation is trivial. Aorta: The aortic root and ascending aorta are structurally normal, with no evidence of dilitation. Venous: The inferior vena cava is normal in size with greater than 50% respiratory variability, suggesting right atrial pressure of 3 mmHg. IAS/Shunts: The interatrial septum was not well visualized.  LEFT VENTRICLE PLAX 2D LVIDd:         4.60 cm   Diastology LVIDs:         3.40 cm   LV e' medial:    6.20 cm/s LV PW:         1.30 cm   LV E/e' medial:  5.0 LV IVS:        1.30 cm   LV e' lateral:   10.90 cm/s LVOT diam:     2.30 cm   LV E/e' lateral: 2.9 LV SV:         58 LV SV Index:   29 LVOT Area:     4.15 cm  RIGHT VENTRICLE RV S prime:     9.79 cm/s TAPSE (M-mode): 1.3 cm LEFT ATRIUM             Index        RIGHT ATRIUM           Index LA diam:        3.80 cm 1.91 cm/m   RA Area:     14.10 cm LA Vol (A2C):   46.3 ml 23.33 ml/m  RA Volume:   37.90 ml  19.09 ml/m LA Vol (A4C):   18.9 ml 9.52 ml/m LA Biplane Vol: 30.7 ml 15.47 ml/m  AORTIC VALVE LVOT Vmax:   94.80 cm/s LVOT Vmean:  61.600 cm/s LVOT VTI:    0.140 m  AORTA Ao Root diam: 3.80 cm Ao Asc diam:  3.30 cm MITRAL VALVE               TRICUSPID VALVE MV Area (PHT): 5.27 cm    TR Peak grad:   11.3 mmHg MV Decel Time: 144 msec    TR Vmax:        168.00 cm/s MV E velocity: 31.13 cm/s MV A velocity: 91.30 cm/s  SHUNTS MV E/A ratio:  0.34        Systemic VTI:  0.14 m                            Systemic Diam: 2.30 cm Oswaldo Milian MD Electronically  signed by Oswaldo Milian MD Signature Date/Time: 03/08/2021/1:30:23 PM    Final    VAS US CAROTID  Result Date: 03/08/2021 Carotid Arterial Duplex Study Patient Name:  MONTARIO ZILKA  Date of Exam:   03/08/2021 Medical Rec #: [id number]         Accession #:    [id number] Date of Birth: 1930/05/01         Patient Gender: M Patient Age:   82 years Exam [facility]  [facility] Procedure:      VAS US CAROTID Referring Phys: Babs Bertin --------------------------------------------------------------------------------  Indications:       CVA. Risk Factors:      Hypertension, hyperlipidemia, Diabetes, past history of                    smoking, coronary artery disease. Other Factors:     CKD3. Comparison Study:  No previous exams Performing Technologist: Jody Hill RVT, RDMS  Examination Guidelines: A complete evaluation includes B-mode imaging, spectral Doppler, color Doppler, and power Doppler as needed of all accessible portions of each vessel. Bilateral testing is considered an integral part of a complete examination. Limited examinations for reoccurring indications may be performed as noted.  Right Carotid Findings: +----------+--------+--------+--------+------------------+------------------+           PSV cm/sEDV cm/sStenosisPlaque DescriptionComments           +----------+--------+--------+--------+------------------+------------------+ CCA Prox  62      10                                intimal thickening +----------+--------+--------+--------+------------------+------------------+ CCA Distal63      10                                                   +----------+--------+--------+--------+------------------+------------------+ ICA Prox  73      12              heterogenous                         +----------+--------+--------+--------+------------------+------------------+ ICA Distal61      11                                                    +----------+--------+--------+--------+------------------+------------------+ ECA       78      0                                                    +----------+--------+--------+--------+------------------+------------------+ +----------+--------+-------+----------------+-------------------+           PSV cm/sEDV cmsDescribe        Arm Pressure (mmHG) +----------+--------+-------+----------------+-------------------+ [id number]            Multiphasic, WNL                    +----------+--------+-------+----------------+-------------------+ +---------+--------+--+--------+-+---------+ VertebralPSV cm/s44EDV cm/s7Antegrade +---------+--------+--+--------+-+---------+  Left Carotid Findings: +----------+--------+--------+--------+------------------+------------------+           PSV cm/sEDV cm/sStenosisPlaque DescriptionComments           +----------+--------+--------+--------+------------------+------------------+ CCA Prox  89      13                                intimal thickening +----------+--------+--------+--------+------------------+------------------+ CCA Distal68      9                                                    +----------+--------+--------+--------+------------------+------------------+  ICA Prox  70      14              calcific                             +----------+--------+--------+--------+------------------+------------------+ ICA Distal76      16                                                   +----------+--------+--------+--------+------------------+------------------+ ECA       62      0                                 shadowing          +----------+--------+--------+--------+------------------+------------------+ +----------+--------+--------+----------------+-------------------+           PSV cm/sEDV cm/sDescribe        Arm Pressure (mmHG) +----------+--------+--------+----------------+-------------------+  [id number]              Multiphasic, WNL                    +----------+--------+--------+----------------+-------------------+ +---------+--------+--+--------+-+---------+ VertebralPSV cm/s46EDV cm/s9Antegrade +---------+--------+--+--------+-+---------+   Summary: Right Carotid: The extracranial vessels were near-normal with only minimal wall                thickening or plaque. Left Carotid: The extracranial vessels were near-normal with only minimal wall               thickening or plaque. Vertebrals:  Bilateral vertebral arteries demonstrate antegrade flow. Subclavians: Normal flow hemodynamics were seen in bilateral subclavian              arteries. *See table(s) above for measurements and observations.  Electronically signed by Antony Contras MD on 03/08/2021 at 1:10:33 PM.    Final        LOS: 2 days   [facility] Pager on [email or web address]  03/10/2021, 9:47 AM

## 2021-03-10 NOTE — Progress Notes (Signed)
Occupational Therapy Treatment Patient Details Name: [name] MRN: [id number] DOB: 23-Dec-1930 Today's Date: 03/10/2021   History of present illness Pt is an 87 year old man admitted on 03/07/21 with L side weakness. MRI + for small foci ischemia R pons and L frontal periventricular white matter. PMH. Alzheimers dementia, DM, HTN, stage 3 CKD, lung ca, prostate ca, multiple skin cancers, CAD, anxiety, macular degeneration.   OT comments  Pt making good progress with functional goals. Pt sat EOB for L UE AAROM exercises, grooming and UB ADLs and sitting balance/tolerance activities. Pt/family hopeful to d/c to AIR; pt very cooperative and motivated. OT will continue to follow acutely tom maximize level of function and safety   Recommendations for follow up therapy are one component of a multi-disciplinary discharge planning process, led by the attending physician.  Recommendations may be updated based on patient status, additional functional criteria and insurance authorization.    Follow Up Recommendations  Acute inpatient rehab (3hours/day)    Assistance Recommended at Discharge Frequent or constant Supervision/Assistance  Equipment Recommendations  BSC/3in1;Wheelchair (measurements OT);Wheelchair cushion (measurements OT)    Recommendations for Other Services      Precautions / Restrictions Precautions Precautions: Fall Precaution Comments: son endorses a few sporadic falls Restrictions Weight Bearing Restrictions: No       Mobility Bed Mobility Overal bed mobility: Needs Assistance Bed Mobility: Supine to Sit Rolling: Mod assist Sidelying to sit: Mod assist;HOB elevated Supine to sit: Mod assist Sit to supine: Min assist   General bed mobility comments: assist to guide LEs over EOB and to raise trunk, no assist to scoot hips out to EOB and get feet on the floor    Transfers Overall transfer level: Needs assistance Equipment used: None Transfers: Sit to/from  Stand Sit to Stand: Mod assist           General transfer comment: assist to rise and steady; no need to block L knee.Stood from EOB x 3 reps     Balance Overall balance assessment: Needs assistance Sitting-balance support: Bilateral upper extremity supported Sitting balance-Leahy Scale: Poor Sitting balance - Comments: able to maintain midline with min guard, however one episode of LOB to hiR side, min A to correct Postural control: Posterior lean;Right lateral lean Standing balance support: Bilateral upper extremity supported Standing balance-Leahy Scale: Poor                             ADL either performed or assessed with clinical judgement   ADL                                              Extremity/Trunk Assessment Upper Extremity Assessment Upper Extremity Assessment: Generalized weakness LUE Deficits / Details: 3/5 LUE Sensation: decreased proprioception LUE Coordination: decreased fine motor;decreased gross motor       Cervical / Trunk Assessment Cervical / Trunk Exceptions: weakness, rotates trunk slightly toward the L in sitting    Vision Baseline Vision/History: 6 Macular Degeneration Patient Visual Report: No change from baseline     Perception     Praxis      Cognition Arousal/Alertness: Awake/alert Behavior During Therapy: WFL for tasks assessed/performed Overall Cognitive Status: History of cognitive impairments - at baseline  General Comments: Dementia at baseline          Exercises Exercises: General Lower Extremity;Other exercises General Exercises - Lower Extremity Ankle Circles/Pumps: PROM;AAROM;Left;10 reps (active PF) Heel Slides: AAROM;Left;10 reps;Supine (resisted extension) Hip ABduction/ADduction: AAROM;Left;10 reps Other Exercises Other Exercises: left leg 1/2 bridging x 10 reps   Shoulder Instructions       General Comments no transfer to chair  due to wife asleep in chair    Pertinent Vitals/ Pain       Pain Assessment: No/denies pain Faces Pain Scale: No hurt  Home Living                                          Prior Functioning/Environment              Frequency  Min 3X/week        Progress Toward Goals  OT Goals(current goals can now be found in the care plan section)  Progress towards OT goals: Progressing toward goals     Plan Discharge plan remains appropriate    Co-evaluation                 AM-PAC OT "6 Clicks" Daily Activity     Outcome Measure   Help from another person eating meals?: A Little Help from another person taking care of personal grooming?: A Little Help from another person toileting, which includes using toliet, bedpan, or urinal?: Total Help from another person bathing (including washing, rinsing, drying)?: A Lot Help from another person to put on and taking off regular upper body clothing?: A Lot Help from another person to put on and taking off regular lower body clothing?: Total 6 Click Score: 12    End of Session    OT Visit Diagnosis: Unsteadiness on feet (R26.81);Other abnormalities of gait and mobility (R26.89);Muscle weakness (generalized) (M62.81);Other symptoms and signs involving cognitive function;Hemiplegia and hemiparesis Hemiplegia - Right/Left: Left Hemiplegia - dominant/non-dominant: Non-Dominant Hemiplegia - caused by: Cerebral infarction   Activity Tolerance Patient tolerated treatment well   Patient Left in bed;with call bell/phone within reach;with family/visitor present;with bed alarm set   Nurse Communication          Time: 1610-9604 OT Time Calculation (min): 28 min  Charges: OT General Charges $OT Visit: 1 Visit OT Treatments $Self Care/Home Management : 8-22 mins $Therapeutic Activity: 8-22 mins    Britt Bottom 03/10/2021, 2:44 PM

## 2021-03-10 NOTE — Plan of Care (Signed)

## 2021-03-10 NOTE — Progress Notes (Signed)
Physical Therapy Treatment Patient Details Name: [name]: [id number] DOB: 04/14/1930 Today's Date: 03/10/2021   History of Present Illness Pt is an 87 year old man admitted on 03/07/21 with L side weakness. MRI + for small foci ischemia R pons and L frontal periventricular white matter. PMH. Alzheimers dementia, DM, HTN, stage 3 CKD, lung ca, prostate ca, multiple skin cancers, CAD, anxiety, macular degeneration.    PT Comments    Patient continues to progress and did a better job listening to cues given and not trying to anticipate what I wanted him to do. Did not have to block left knee during standing and pt was able to take small lateral steps to his left with mod assist. Patient will benefit from intensive rehab program.     Recommendations for follow up therapy are one component of a multi-disciplinary discharge planning process, led by the attending physician.  Recommendations may be updated based on patient status, additional functional criteria and insurance authorization.  Follow Up Recommendations  Acute inpatient rehab (3hours/day)     Assistance Recommended at Discharge Frequent or constant Supervision/Assistance  Equipment Recommendations  Wheelchair cushion (measurements PT);Wheelchair (measurements PT)    Recommendations for Other Services       Precautions / Restrictions Precautions Precautions: Fall Precaution Comments: son endorses a few sporadic falls     Mobility  Bed Mobility Overal bed mobility: Needs Assistance Bed Mobility: Supine to Sit     Supine to sit: Mod assist (exit to his left) Sit to supine: Min assist   General bed mobility comments: assist to guide LEs over EOB and to raise trunk, no assist to scoot hips out to EOB and get feet on the floor    Transfers Overall transfer level: Needs assistance Equipment used: None Transfers: Sit to/from Stand Sit to Stand: Mod assist           General transfer comment: assist to rise  and steady; no need to block L knee.Stood from EOB x 3 reps    Ambulation/Gait             Pre-gait activities: side step to left (toward [facility]) with mod assist for advancing LLE and maintaining balance. 3 steps with each foot     Stairs             Wheelchair Mobility    Modified Rankin (Stroke Patients Only)       Balance Overall balance assessment: Needs assistance Sitting-balance support: Bilateral upper extremity supported Sitting balance-Leahy Scale: Poor Sitting balance - Comments: able to maintain midline with closeguard, however one episode of LOB to his rt with mod assist to recover Postural control: Posterior lean Standing balance support: Bilateral upper extremity supported Standing balance-Leahy Scale: Poor                              Cognition Arousal/Alertness: Awake/alert Behavior During Therapy: WFL for tasks assessed/performed Overall Cognitive Status: History of cognitive impairments - at baseline                                 General Comments: Dementia at baseline        Exercises General Exercises - Lower Extremity Ankle Circles/Pumps: PROM;AAROM;Left;10 reps (active PF) Heel Slides: AAROM;Left;10 reps;Supine (resisted extension) Hip ABduction/ADduction: AAROM;Left;10 reps Other Exercises Other Exercises: left leg 1/2 bridging x 10 reps    General  Comments General comments (skin integrity, edema, etc.): no transfer to chair due to wife asleep in chair      Pertinent Vitals/Pain Pain Assessment: No/denies pain Faces Pain Scale: No hurt    Home Living                          Prior Function            PT Goals (current goals can now be found in the care plan section) Acute Rehab PT Goals Patient Stated Goal: to get better PT Goal Formulation: With patient Time For Goal Achievement: 03/22/21 Potential to Achieve Goals: Good Progress towards PT goals: Progressing toward goals     Frequency    Min 4X/week      PT Plan Current plan remains appropriate    Co-evaluation              AM-PAC PT "6 Clicks" Mobility   Outcome Measure  Help needed turning from your back to your side while in a flat bed without using bedrails?: A Lot Help needed moving from lying on your back to sitting on the side of a flat bed without using bedrails?: A Lot Help needed moving to and from a bed to a chair (including a wheelchair)?: A Lot Help needed standing up from a chair using your arms (e.g., wheelchair or bedside chair)?: A Lot Help needed to walk in hospital room?: Total Help needed climbing 3-5 steps with a railing? : Total 6 Click Score: 10    End of Session Equipment Utilized During Treatment: Gait belt Activity Tolerance: Patient tolerated treatment well Patient left: with call bell/phone within reach;with family/visitor present;in bed;with bed alarm set (on stretcher in ED)   PT Visit Diagnosis: Unsteadiness on feet (R26.81);Muscle weakness (generalized) (M62.81);Difficulty in walking, not elsewhere classified (R26.2)     Time: 8592-9244 PT Time Calculation (min) (ACUTE ONLY): 24 min  Charges:  $Neuromuscular Re-education: 23-37 mins                      [name], PT Acute Rehabilitation Services  Pager [phone] Office [phone]    [name] 03/10/2021, 11:00 AM

## 2021-03-11 LAB — BASIC METABOLIC PANEL
Anion gap: 5 (ref 5–15)
BUN: 22 mg/dL (ref 8–23)
CO2: 25 mmol/L (ref 22–32)
Calcium: 8.7 mg/dL — ABNORMAL LOW (ref 8.9–10.3)
Chloride: 105 mmol/L (ref 98–111)
Creatinine, Ser: 1.41 mg/dL — ABNORMAL HIGH (ref 0.61–1.24)
GFR, Estimated: 48 mL/min — ABNORMAL LOW (ref >60)
Glucose, Bld: 170 mg/dL — ABNORMAL HIGH (ref 70–99)
Potassium: 4.4 mmol/L (ref 3.5–5.1)
Sodium: 135 mmol/L (ref 135–145)

## 2021-03-11 LAB — GLUCOSE, CAPILLARY
Glucose-Capillary: 256 mg/dL — ABNORMAL HIGH (ref 70–99)
Glucose-Capillary: 190 mg/dL — ABNORMAL HIGH (ref 70–99)
Glucose-Capillary: 215 mg/dL — ABNORMAL HIGH (ref 70–99)
Glucose-Capillary: 201 mg/dL — ABNORMAL HIGH (ref 70–99)

## 2021-03-11 LAB — MAGNESIUM: Magnesium: 2 mg/dL (ref 1.7–2.4)

## 2021-03-11 MED ORDER — INSULIN GLARGINE-YFGN 100 UNIT/ML ~~LOC~~ SOLN
10.0000 [IU] | Freq: Every day | SUBCUTANEOUS | Status: DC
  Administered 2021-03-12: 10 [IU] via SUBCUTANEOUS
  Filled 2021-03-11 (×2): qty 0.1

## 2021-03-11 NOTE — Progress Notes (Signed)
Physical Therapy Treatment Patient Details Name: [name] MRN: [id number] DOB: October 29, 1930 Today's Date: 03/11/2021   History of Present Illness Pt is an 87 year old man admitted on 03/07/21 with L side weakness. MRI + for small foci ischemia R pons and L frontal periventricular white matter. PMH. Alzheimers dementia, DM, HTN, stage 3 CKD, lung ca, prostate ca, multiple skin cancers, CAD, anxiety, macular degeneration.    PT Comments    Patient continues to make progress with ability to ambulate 5 ft with +2 mod assist. Still slightly impulsive with moving prior to therapist cuing him to do so. Again emphasized that we have to work together to safely mobilize and he overall did do better with this today.     Recommendations for follow up therapy are one component of a multi-disciplinary discharge planning process, led by the attending physician.  Recommendations may be updated based on patient status, additional functional criteria and insurance authorization.  Follow Up Recommendations  Acute inpatient rehab (3hours/day)     Assistance Recommended at Discharge Frequent or constant Supervision/Assistance  Equipment Recommendations  Wheelchair cushion (measurements PT);Wheelchair (measurements PT)    Recommendations for Other Services       Precautions / Restrictions Precautions Precautions: Fall Precaution Comments: son endorses a few sporadic falls     Mobility  Bed Mobility Overal bed mobility: Needs Assistance Bed Mobility: Supine to Sit Rolling: Min assist Sidelying to sit: Mod assist;HOB elevated       General bed mobility comments: assist to guide LUE as rolling to right, LEs over EOB and to raise trunk, no assist to scoot hips out to EOB and get feet on the floor    Transfers Overall transfer level: Needs assistance Equipment used: None Transfers: Sit to/from Stand Sit to Stand: Mod assist     Step pivot transfers: Mod assist;+2 physical assistance;+2  safety/equipment     General transfer comment: assist to rise and steady; no need to block L knee.Stood from EOB x 3 reps    Ambulation/Gait Ambulation/Gait assistance: Mod assist;+2 physical assistance Gait Distance (Feet): 5 Feet Assistive device: 2 person hand held assist Gait Pattern/deviations: Step-to pattern;Decreased step length - left;Decreased weight shift to left;Knee flexed in stance - left;Narrow base of support       General Gait Details: pt able to advance LLE for small step forward, however requires assist to widen step (LLE tends to adduct); max cues for sequencing with pt getting impulsive and stepping prior to cues x 1   Stairs             Wheelchair Mobility    Modified Rankin (Stroke Patients Only) Modified Rankin (Stroke Patients Only) Pre-Morbid Rankin Score: No symptoms Modified Rankin: Moderately severe disability     Balance Overall balance assessment: Needs assistance Sitting-balance support: No upper extremity supported;Feet supported Sitting balance-Leahy Scale: Fair   Postural control: Posterior lean Standing balance support: Bilateral upper extremity supported Standing balance-Leahy Scale: Poor                              Cognition Arousal/Alertness: Awake/alert Behavior During Therapy: WFL for tasks assessed/performed Overall Cognitive Status: History of cognitive impairments - at baseline                                 General Comments: Dementia at baseline  Exercises General Exercises - Lower Extremity Heel Slides: AAROM;Left;10 reps;Supine (resisted extension)    General Comments        Pertinent Vitals/Pain Pain Assessment: No/denies pain Faces Pain Scale: No hurt    Home Living                          Prior Function            PT Goals (current goals can now be found in the care plan section) Acute Rehab PT Goals Patient Stated Goal: to get better PT Goal  Formulation: With patient Time For Goal Achievement: 03/22/21 Potential to Achieve Goals: Good Progress towards PT goals: Progressing toward goals    Frequency    Min 4X/week      PT Plan Current plan remains appropriate    Co-evaluation              AM-PAC PT "6 Clicks" Mobility   Outcome Measure  Help needed turning from your back to your side while in a flat bed without using bedrails?: A Little Help needed moving from lying on your back to sitting on the side of a flat bed without using bedrails?: A Lot Help needed moving to and from a bed to a chair (including a wheelchair)?: A Lot Help needed standing up from a chair using your arms (e.g., wheelchair or bedside chair)?: A Lot Help needed to walk in hospital room?: Total Help needed climbing 3-5 steps with a railing? : Total 6 Click Score: 11    End of Session Equipment Utilized During Treatment: Gait belt Activity Tolerance: Patient tolerated treatment well Patient left: with call bell/phone within reach;with family/visitor present;in chair;with chair alarm set (on stretcher in ED) Nurse Communication: Mobility status (transfer to his right) PT Visit Diagnosis: Unsteadiness on feet (R26.81);Muscle weakness (generalized) (M62.81);Difficulty in walking, not elsewhere classified (R26.2)     Time: 5465-6812 PT Time Calculation (min) (ACUTE ONLY): 19 min  Charges:  $Neuromuscular Re-education: 8-22 mins                      [name], PT Acute Rehabilitation Services  Pager [phone] Office [phone]    [name] 03/11/2021, 12:44 PM

## 2021-03-11 NOTE — Progress Notes (Signed)
[facility] PROGRESS NOTE   [name] JJH:[id number] DOB: 04-23-1930 DOA: 03/07/2021  PCP: [name], MD  Brief History/Interval Summary: [name] is a 87 y.o. male with medical history significant for Alzheimer dementia, type 2 diabetes mellitus, essential hypertension, stage IIIb chronic kidney disease with baseline creatinine 1.5-1.9,  who is admitted to [facility] on 03/07/2021 with acute ischemic CVA after presenting from home to [facility] ED complaining of left-sided weakness.    Consultants: Neurology  Procedures: Transthoracic echocardiogram.  Carotid Doppler.    Subjective/Interval History: Patient remains pleasantly confused.  However he does all answer all questions appropriately.  Denies any chest pain shortness of breath.  Has noticed improved mobility in his left upper extremity though it still remains weak along with his left lower extremity     Assessment/Plan:  Acute stroke Patient with left-sided hemiparesis and facial droop.  MRI confirmed stroke.  Seen by neurology. LDL is 83.  HbA1c 8.3.  Patient is on atorvastatin. Echocardiogram shows normal systolic function with grade 1 diastolic dysfunction.  No embolic source identified.   No significant stenosis noted on carotid Doppler. Seen by physical therapy.  Inpatient rehabilitation is recommended.   Seen by speech therapy.  On dysphagia 3 diet with nectar thick liquids. Neurology recommended aspirin and Plavix for 3 weeks followed by aspirin alone. Neurological status is stable.  Slightly improved strength noted in the left upper and lower extremities.  Waiting on inpatient rehabilitation.  History of dementia Pleasantly confused.  Continue Aricept.    Acute on chronic kidney disease stage IIIb Old labs reviewed.  Creatinine has been fluctuating over the last several months occasionally as high as 1.8 and as low as 0.9.   Presented with creatinine of 1.3.  Went up slightly to 1.6.   Creatinine continues to be stable.  This could all be just his chronic kidney disease.   IV fluids were discontinued.  Encourage oral intake Avoid nephrotoxic agents.  Monitor urine output.  Macrocytic anemia/vitamin B12 deficiency Anemia panel showed a B12 level of 179.  Will start supplementation Ferritin was 42 with a TIBC of 342 and iron of 77.  There could be an element of iron deficiency as well.  Started on iron supplementation as well.   TSH was normal.   Hemoglobin has been stable.  Diabetes mellitus type 2, uncontrolled with hyperglycemia Patient on glipizide at home.  HbA1c is 8.3.   Started on glargine yesterday.  We will increase the dose slightly due to elevated CBGs.  Continue SSI.  Mild thrombocytopenia Monitor periodically.  Stable.  History of generalized anxiety disorder Continue Zoloft.   DVT Prophylaxis: Initiate Lovenox Code Status: Full code Family Communication: Daughter was updated 2 days ago. Disposition Plan: Inpatient rehabilitation is recommended.  Status is: Inpatient  Remains inpatient appropriate because: Acute stroke, need for rehabilitation      Medications: Scheduled:  aspirin EC  81 mg Oral Daily   atorvastatin  20 mg Oral q1800   clopidogrel  75 mg Oral Daily   cyanocobalamin  1,000 mcg Intramuscular Daily   donepezil  10 mg Oral QHS   enoxaparin (LOVENOX) injection  40 mg Subcutaneous Q24H   ferrous sulfate  325 mg Oral Q breakfast   insulin aspart  0-9 Units Subcutaneous TID WC   insulin glargine-yfgn  8 Units Subcutaneous Daily   pantoprazole  40 mg Oral Daily   polyethylene glycol  17 g Oral Daily   senna-docusate  2 tablet Oral  BID   sertraline  100 mg Oral Daily   Continuous:  DPO:[id number] **OR** acetaminophen, food thickener  Antibiotics: Anti-infectives (From admission, onward)    None       Objective:  Vital Signs  Vitals:   03/10/21 2025 03/11/21 0022 03/11/21 0500 03/11/21 0809  BP: (!) 152/65 (!)  155/78 (!) 155/70 127/61  Pulse: 73 77 78 76  Resp: 17 20 15  (!) 23  Temp: 98.5 F (36.9 C) 97.9 F (36.6 C) 98 F (36.7 C) 97.9 F (36.6 C)  TempSrc: Oral Oral Oral Oral  SpO2: 95% 98% 95% 94%  Weight:   85.2 kg   Height:        Intake/Output Summary (Last 24 hours) at 03/11/2021 0951 Last data filed at 03/11/2021 0721 Gross per 24 hour  Intake 290 ml  Output 625 ml  Net -335 ml    Filed Weights   03/07/21 1801 03/11/21 0500  Weight: 82.6 kg 85.2 kg    General appearance: Awake alert.  In no distress Resp: Clear to auscultation bilaterally.  Normal effort Cardio: S1-S2 is normal regular.  No S3-S4.  No rubs murmurs or bruit GI: Abdomen is soft.  Nontender nondistended.  Bowel sounds are present normal.  No masses organomegaly Extremities: No edema.  Full range of motion of lower extremities. Neurologic: Left-sided hemiparesis slightly improved strength in the left upper extremity.    Lab Results:  Data Reviewed: I have personally reviewed following labs and imaging studies  CBC: Recent Labs  Lab 03/07/21 1928 03/08/21 0553 03/09/21 0037 03/10/21 0057  WBC 7.6 6.2 6.6 6.6  NEUTROABS 5.4  --   --   --   HGB 13.1 11.7* 11.7* 12.3*  HCT 39.1 35.6* 34.9* 36.4*  MCV 100.8* 100.3* 100.0 99.7  PLT 121* 105* 117* 113*     Basic Metabolic Panel: Recent Labs  Lab 03/08/21 0424 03/08/21 1037 03/09/21 0037 03/10/21 0057 03/11/21 0242  NA 139 135 134* 138 135  K 2.8* 4.6 4.6 4.7 4.4  CL 119* 105 103 104 105  CO2 17* 23 23 24 25   GLUCOSE 145* 278* 231* 186* 170*  BUN 14 17 22 23 22   CREATININE 0.90 1.30* 1.60* 1.54* 1.41*  CALCIUM 5.6* 8.7* 8.4* 8.6* 8.7*  MG 1.4*  --   --   --  2.0     GFR: Estimated Creatinine Clearance: 39.2 mL/min (A) (by C-G formula based on SCr of 1.41 mg/dL (H)).  Liver Function Tests: Recent Labs  Lab 03/07/21 1928 03/08/21 0424  AST 11* 6*  ALT 11 6  ALKPHOS 81 48  BILITOT 0.6 0.6  PROT 6.9 3.8*  ALBUMIN 3.8 2.1*       Coagulation Profile: Recent Labs  Lab 03/07/21 1928  INR 1.1     CBG: Recent Labs  Lab 03/10/21 0724 03/10/21 1158 03/10/21 1716 03/10/21 2023 03/11/21 0809  GLUCAP 211* 240* 172* 224* 256*        Recent Results (from the past 240 hour(s))  Resp Panel by RT-PCR (Flu A&B, Covid) Nasopharyngeal Swab     Status: None   Collection Time: 03/07/21  7:24 PM   Specimen: Nasopharyngeal Swab; Nasopharyngeal(NP) swabs in vial transport medium  Result Value Ref Range Status   SARS Coronavirus 2 by RT PCR NEGATIVE NEGATIVE Final    Comment: (NOTE) SARS-CoV-2 target nucleic acids are NOT DETECTED.  The SARS-CoV-2 RNA is generally detectable in upper respiratory specimens during the acute phase of infection. The lowest concentration of  SARS-CoV-2 viral copies this assay can detect is 138 copies/mL. A negative result does not preclude SARS-Cov-2 infection and should not be used as the sole basis for treatment or other patient management decisions. A negative result may occur with  improper specimen collection/handling, submission of specimen other than nasopharyngeal swab, presence of viral mutation(s) within the areas targeted by this assay, and inadequate number of viral copies(<138 copies/mL). A negative result must be combined with clinical observations, patient history, and epidemiological information. The expected result is Negative.  Fact Sheet for Patients:  EntrepreneurPulse.com.au  Fact Sheet for Healthcare Providers:  IncredibleEmployment.be  This test is no t yet approved or cleared by the [place] FDA and  has been authorized for detection and/or diagnosis of SARS-CoV-2 by FDA under an Emergency Use Authorization (EUA). This EUA will remain  in effect (meaning this test can be used) for the duration of the COVID-19 declaration under Section 564(b)(1) of the Act, 21 U.S.C.section 360bbb-3(b)(1), unless the  authorization is terminated  or revoked sooner.       Influenza A by PCR NEGATIVE NEGATIVE Final   Influenza B by PCR NEGATIVE NEGATIVE Final    Comment: (NOTE) The Xpert Xpress SARS-CoV-2/FLU/RSV plus assay is intended as an aid in the diagnosis of influenza from Nasopharyngeal swab specimens and should not be used as a sole basis for treatment. Nasal washings and aspirates are unacceptable for Xpert Xpress SARS-CoV-2/FLU/RSV testing.  Fact Sheet for Patients: EntrepreneurPulse.com.au  Fact Sheet for Healthcare Providers: IncredibleEmployment.be  This test is not yet approved or cleared by the [place] FDA and has been authorized for detection and/or diagnosis of SARS-CoV-2 by FDA under an Emergency Use Authorization (EUA). This EUA will remain in effect (meaning this test can be used) for the duration of the COVID-19 declaration under Section 564(b)(1) of the Act, 21 U.S.C. section 360bbb-3(b)(1), unless the authorization is terminated or revoked.  Performed at [facility], [place] [address], [place], [place] [address]        Radiology Studies: No results found.     LOS: 3 days   Gaye Scorza [employer] on [email or web address]  03/11/2021, 9:51 AM

## 2021-03-11 NOTE — Care Management Important Message (Signed)
Important Message  Patient Details  Name: [name] MRN: [id number] Date of Birth: 11-12-1930   Medicare Important Message Given:  Yes     Orbie Pyo 03/11/2021, 2:02 PM

## 2021-03-11 NOTE — Progress Notes (Signed)
Inpatient Rehabilitation Admissions Coordinator   I continue to await insurance approval for a CIR admit.  Danne Baxter, RN, MSN Rehab Admissions Coordinator [phone] 03/11/2021 8:35 AM

## 2021-03-11 NOTE — Progress Notes (Signed)
Inpatient Diabetes Program Recommendations  AACE/ADA: New Consensus Statement on Inpatient Glycemic Control (2015)  Target Ranges:  Prepandial:   less than 140 mg/dL      Peak postprandial:   less than 180 mg/dL (1-2 hours)      Critically ill patients:  140 - 180 mg/dL   Lab Results  Component Value Date   GLUCAP 256 (H) 03/11/2021   HGBA1C 8.3 (H) 03/08/2021    Review of Glycemic Control  Latest Reference Range & Units 03/10/21 17:16 03/10/21 20:23 03/11/21 08:09  Glucose-Capillary 70 - 99 mg/dL 172 (H) 224 (H) 256 (H)  (H): Data is abnormally high Diabetes history: Type 2 DM Outpatient Diabetes medications: Glipizide 5 mg QD, Metformin 500 mg BID Current orders for Inpatient glycemic control: Novolog 0-9 units TID, Semglee 8 units QD   Inpatient Diabetes Program Recommendations:     Could additionally consider adding Novolog 0-5 units QHs.   Thanks, Bronson Curb, MSN, RNC-OB Diabetes Coordinator [phone] (8a-5p)

## 2021-03-12 ENCOUNTER — Inpatient Hospital Stay (HOSPITAL_COMMUNITY)
Admission: RE | Admit: 2021-03-12 | Discharge: 2021-04-02 | DRG: 057 | Disposition: A | Discharge status: Home / Self Care | Payer: Medicare HMO | Source: Intra-hospital | Attending: Physical Medicine & Rehabilitation | Admitting: Physical Medicine & Rehabilitation

## 2021-03-12 ENCOUNTER — Encounter (HOSPITAL_COMMUNITY): Payer: Self-pay | Admitting: Physical Medicine & Rehabilitation

## 2021-03-12 ENCOUNTER — Other Ambulatory Visit: Payer: Self-pay

## 2021-03-12 DIAGNOSIS — N1831 Chronic kidney disease, stage 3a: Secondary | ICD-10-CM | POA: Diagnosis not present

## 2021-03-12 DIAGNOSIS — N1832 Chronic kidney disease, stage 3b: Secondary | ICD-10-CM | POA: Diagnosis not present

## 2021-03-12 DIAGNOSIS — H1089 Other conjunctivitis: Secondary | ICD-10-CM | POA: Diagnosis present

## 2021-03-12 DIAGNOSIS — Z7984 Long term (current) use of oral hypoglycemic drugs: Secondary | ICD-10-CM | POA: Diagnosis not present

## 2021-03-12 DIAGNOSIS — Z800 Family history of malignant neoplasm of digestive organs: Secondary | ICD-10-CM

## 2021-03-12 DIAGNOSIS — I6350 Cerebral infarction due to unspecified occlusion or stenosis of unspecified cerebral artery: Secondary | ICD-10-CM | POA: Diagnosis not present

## 2021-03-12 DIAGNOSIS — Z85118 Personal history of other malignant neoplasm of bronchus and lung: Secondary | ICD-10-CM | POA: Diagnosis not present

## 2021-03-12 DIAGNOSIS — Z85828 Personal history of other malignant neoplasm of skin: Secondary | ICD-10-CM | POA: Diagnosis not present

## 2021-03-12 DIAGNOSIS — E1122 Type 2 diabetes mellitus with diabetic chronic kidney disease: Secondary | ICD-10-CM | POA: Diagnosis present

## 2021-03-12 DIAGNOSIS — I69391 Dysphagia following cerebral infarction: Secondary | ICD-10-CM

## 2021-03-12 DIAGNOSIS — Z8546 Personal history of malignant neoplasm of prostate: Secondary | ICD-10-CM

## 2021-03-12 DIAGNOSIS — Z85818 Personal history of malignant neoplasm of other sites of lip, oral cavity, and pharynx: Secondary | ICD-10-CM | POA: Diagnosis not present

## 2021-03-12 DIAGNOSIS — R040 Epistaxis: Secondary | ICD-10-CM | POA: Diagnosis not present

## 2021-03-12 DIAGNOSIS — E119 Type 2 diabetes mellitus without complications: Secondary | ICD-10-CM | POA: Diagnosis not present

## 2021-03-12 DIAGNOSIS — Z833 Family history of diabetes mellitus: Secondary | ICD-10-CM | POA: Diagnosis not present

## 2021-03-12 DIAGNOSIS — I129 Hypertensive chronic kidney disease with stage 1 through stage 4 chronic kidney disease, or unspecified chronic kidney disease: Secondary | ICD-10-CM | POA: Diagnosis present

## 2021-03-12 DIAGNOSIS — G309 Alzheimer's disease, unspecified: Secondary | ICD-10-CM | POA: Diagnosis present

## 2021-03-12 DIAGNOSIS — K5900 Constipation, unspecified: Secondary | ICD-10-CM | POA: Diagnosis present

## 2021-03-12 DIAGNOSIS — Z7982 Long term (current) use of aspirin: Secondary | ICD-10-CM

## 2021-03-12 DIAGNOSIS — F411 Generalized anxiety disorder: Secondary | ICD-10-CM | POA: Diagnosis not present

## 2021-03-12 DIAGNOSIS — R531 Weakness: Secondary | ICD-10-CM | POA: Diagnosis not present

## 2021-03-12 DIAGNOSIS — Z87891 Personal history of nicotine dependence: Secondary | ICD-10-CM

## 2021-03-12 DIAGNOSIS — D509 Iron deficiency anemia, unspecified: Secondary | ICD-10-CM | POA: Diagnosis present

## 2021-03-12 DIAGNOSIS — R3915 Urgency of urination: Secondary | ICD-10-CM | POA: Diagnosis not present

## 2021-03-12 DIAGNOSIS — D539 Nutritional anemia, unspecified: Secondary | ICD-10-CM | POA: Diagnosis present

## 2021-03-12 DIAGNOSIS — F0284 Dementia in other diseases classified elsewhere, unspecified severity, with anxiety: Secondary | ICD-10-CM | POA: Diagnosis not present

## 2021-03-12 DIAGNOSIS — Z9079 Acquired absence of other genital organ(s): Secondary | ICD-10-CM

## 2021-03-12 DIAGNOSIS — E538 Deficiency of other specified B group vitamins: Secondary | ICD-10-CM | POA: Diagnosis present

## 2021-03-12 DIAGNOSIS — E1159 Type 2 diabetes mellitus with other circulatory complications: Secondary | ICD-10-CM | POA: Diagnosis not present

## 2021-03-12 DIAGNOSIS — Z23 Encounter for immunization: Secondary | ICD-10-CM

## 2021-03-12 DIAGNOSIS — Z79899 Other long term (current) drug therapy: Secondary | ICD-10-CM | POA: Diagnosis not present

## 2021-03-12 DIAGNOSIS — Z902 Acquired absence of lung [part of]: Secondary | ICD-10-CM

## 2021-03-12 DIAGNOSIS — I69354 Hemiplegia and hemiparesis following cerebral infarction affecting left non-dominant side: Secondary | ICD-10-CM | POA: Diagnosis not present

## 2021-03-12 DIAGNOSIS — I639 Cerebral infarction, unspecified: Secondary | ICD-10-CM | POA: Diagnosis not present

## 2021-03-12 DIAGNOSIS — I2510 Atherosclerotic heart disease of native coronary artery without angina pectoris: Secondary | ICD-10-CM | POA: Diagnosis not present

## 2021-03-12 DIAGNOSIS — H3530 Unspecified macular degeneration: Secondary | ICD-10-CM | POA: Diagnosis present

## 2021-03-12 DIAGNOSIS — Z801 Family history of malignant neoplasm of trachea, bronchus and lung: Secondary | ICD-10-CM

## 2021-03-12 DIAGNOSIS — R1312 Dysphagia, oropharyngeal phase: Secondary | ICD-10-CM | POA: Diagnosis present

## 2021-03-12 DIAGNOSIS — N179 Acute kidney failure, unspecified: Secondary | ICD-10-CM | POA: Diagnosis not present

## 2021-03-12 DIAGNOSIS — D6959 Other secondary thrombocytopenia: Secondary | ICD-10-CM | POA: Diagnosis present

## 2021-03-12 DIAGNOSIS — E785 Hyperlipidemia, unspecified: Secondary | ICD-10-CM | POA: Diagnosis present

## 2021-03-12 LAB — GLUCOSE, CAPILLARY
Glucose-Capillary: 175 mg/dL — ABNORMAL HIGH (ref 70–99)
Glucose-Capillary: 258 mg/dL — ABNORMAL HIGH (ref 70–99)
Glucose-Capillary: 184 mg/dL — ABNORMAL HIGH (ref 70–99)
Glucose-Capillary: 301 mg/dL — ABNORMAL HIGH (ref 70–99)

## 2021-03-12 MED ORDER — INSULIN GLARGINE-YFGN 100 UNIT/ML ~~LOC~~ SOLN
10.0000 [IU] | Freq: Every day | SUBCUTANEOUS | Status: DC
  Administered 2021-03-13 – 2021-03-17 (×5): 10 [IU] via SUBCUTANEOUS
  Filled 2021-03-12 (×5): qty 0.1

## 2021-03-12 MED ORDER — SERTRALINE HCL 100 MG PO TABS
100.0000 mg | ORAL_TABLET | Freq: Every day | ORAL | Status: DC
  Administered 2021-03-13 – 2021-04-02 (×21): 100 mg via ORAL
  Filled 2021-03-12 (×21): qty 1

## 2021-03-12 MED ORDER — INSULIN ASPART 100 UNIT/ML IJ SOLN
0.0000 [IU] | Freq: Three times a day (TID) | INTRAMUSCULAR | Status: DC
  Administered 2021-03-12 – 2021-03-14 (×5): 2 [IU] via SUBCUTANEOUS
  Administered 2021-03-14: 3 [IU] via SUBCUTANEOUS
  Administered 2021-03-14: 2 [IU] via SUBCUTANEOUS
  Administered 2021-03-15: 3 [IU] via SUBCUTANEOUS
  Administered 2021-03-15: 2 [IU] via SUBCUTANEOUS
  Administered 2021-03-15: 5 [IU] via SUBCUTANEOUS
  Administered 2021-03-16: 2 [IU] via SUBCUTANEOUS
  Administered 2021-03-16 – 2021-03-18 (×8): 3 [IU] via SUBCUTANEOUS
  Administered 2021-03-19: 5 [IU] via SUBCUTANEOUS
  Administered 2021-03-19: 2 [IU] via SUBCUTANEOUS
  Administered 2021-03-19: 3 [IU] via SUBCUTANEOUS
  Administered 2021-03-20 (×2): 2 [IU] via SUBCUTANEOUS
  Administered 2021-03-20: 3 [IU] via SUBCUTANEOUS
  Administered 2021-03-21 (×3): 2 [IU] via SUBCUTANEOUS
  Administered 2021-03-22: 13:00:00 1 [IU] via SUBCUTANEOUS
  Administered 2021-03-22: 17:00:00 2 [IU] via SUBCUTANEOUS
  Administered 2021-03-22: 08:00:00 3 [IU] via SUBCUTANEOUS
  Administered 2021-03-23 – 2021-03-24 (×4): 2 [IU] via SUBCUTANEOUS
  Administered 2021-03-24: 19:00:00 1 [IU] via SUBCUTANEOUS
  Administered 2021-03-25: 13:00:00 2 [IU] via SUBCUTANEOUS
  Administered 2021-03-25 (×2): 1 [IU] via SUBCUTANEOUS
  Administered 2021-03-26: 18:00:00 3 [IU] via SUBCUTANEOUS
  Administered 2021-03-26 – 2021-03-27 (×3): 2 [IU] via SUBCUTANEOUS
  Administered 2021-03-27 – 2021-03-28 (×2): 1 [IU] via SUBCUTANEOUS
  Administered 2021-03-28: 18:00:00 3 [IU] via SUBCUTANEOUS
  Administered 2021-03-29 – 2021-04-01 (×5): 2 [IU] via SUBCUTANEOUS
  Administered 2021-04-01 – 2021-04-02 (×3): 1 [IU] via SUBCUTANEOUS

## 2021-03-12 MED ORDER — DONEPEZIL HCL 10 MG PO TABS
10.0000 mg | ORAL_TABLET | Freq: Every day | ORAL | Status: DC
  Administered 2021-03-12 – 2021-04-01 (×21): 10 mg via ORAL
  Filled 2021-03-12 (×21): qty 1

## 2021-03-12 MED ORDER — PANTOPRAZOLE SODIUM 40 MG PO TBEC
40.0000 mg | DELAYED_RELEASE_TABLET | Freq: Every day | ORAL | Status: DC
  Administered 2021-03-13 – 2021-04-02 (×21): 40 mg via ORAL
  Filled 2021-03-12 (×21): qty 1

## 2021-03-12 MED ORDER — INFLUENZA VAC A&B SA ADJ QUAD 0.5 ML IM PRSY
0.5000 mL | PREFILLED_SYRINGE | INTRAMUSCULAR | Status: AC
  Administered 2021-03-13: 0.5 mL via INTRAMUSCULAR
  Filled 2021-03-12: qty 0.5

## 2021-03-12 MED ORDER — ACETAMINOPHEN 650 MG RE SUPP: 650.0000 mg | Freq: Four times a day (QID) | RECTAL | Status: DC | PRN

## 2021-03-12 MED ORDER — VITAMIN B-12 1000 MCG PO TABS: 1000.0000 ug | ORAL_TABLET | Freq: Every day | ORAL | Status: DC

## 2021-03-12 MED ORDER — ASPIRIN EC 81 MG PO TBEC
81.0000 mg | DELAYED_RELEASE_TABLET | Freq: Every day | ORAL | Status: DC
  Administered 2021-03-13 – 2021-04-02 (×21): 81 mg via ORAL
  Filled 2021-03-12 (×21): qty 1

## 2021-03-12 MED ORDER — ACETAMINOPHEN 325 MG PO TABS
650.0000 mg | ORAL_TABLET | Freq: Four times a day (QID) | ORAL | Status: DC | PRN
  Administered 2021-03-13 – 2021-03-21 (×6): 650 mg via ORAL
  Filled 2021-03-12 (×7): qty 2

## 2021-03-12 MED ORDER — FOOD THICKENER (SIMPLYTHICK): 10.0000 | ORAL | Status: DC | PRN

## 2021-03-12 MED ORDER — ENOXAPARIN SODIUM 40 MG/0.4ML IJ SOSY
40.0000 mg | PREFILLED_SYRINGE | INTRAMUSCULAR | Status: DC
  Administered 2021-03-12 – 2021-03-30 (×19): 40 mg via SUBCUTANEOUS
  Filled 2021-03-12 (×21): qty 0.4

## 2021-03-12 MED ORDER — ENOXAPARIN SODIUM 40 MG/0.4ML IJ SOSY: 40.0000 mg | PREFILLED_SYRINGE | INTRAMUSCULAR | Status: DC

## 2021-03-12 MED ORDER — FERROUS SULFATE 325 (65 FE) MG PO TABS
325.0000 mg | ORAL_TABLET | Freq: Every day | ORAL | Status: DC
  Administered 2021-03-13 – 2021-04-02 (×21): 325 mg via ORAL
  Filled 2021-03-12 (×21): qty 1

## 2021-03-12 MED ORDER — ATORVASTATIN CALCIUM 10 MG PO TABS
20.0000 mg | ORAL_TABLET | Freq: Every day | ORAL | Status: DC
  Administered 2021-03-12 – 2021-04-01 (×21): 20 mg via ORAL
  Filled 2021-03-12 (×22): qty 2

## 2021-03-12 MED ORDER — SENNOSIDES-DOCUSATE SODIUM 8.6-50 MG PO TABS
2.0000 | ORAL_TABLET | Freq: Two times a day (BID) | ORAL | Status: DC
  Administered 2021-03-12 – 2021-03-15 (×6): 2 via ORAL
  Administered 2021-03-15: 1 via ORAL
  Administered 2021-03-16 – 2021-03-19 (×8): 2 via ORAL
  Administered 2021-03-20: 1 via ORAL
  Administered 2021-03-21 – 2021-04-02 (×24): 2 via ORAL
  Filled 2021-03-12 (×41): qty 2

## 2021-03-12 MED ORDER — CLOPIDOGREL BISULFATE 75 MG PO TABS
75.0000 mg | ORAL_TABLET | Freq: Every day | ORAL | Status: DC
  Administered 2021-03-13 – 2021-03-30 (×18): 75 mg via ORAL
  Filled 2021-03-12 (×18): qty 1

## 2021-03-12 MED ORDER — VITAMIN B-12 1000 MCG PO TABS
1000.0000 ug | ORAL_TABLET | Freq: Every day | ORAL | Status: DC
  Administered 2021-03-15 – 2021-04-02 (×19): 1000 ug via ORAL
  Filled 2021-03-12 (×20): qty 1

## 2021-03-12 MED ORDER — POLYETHYLENE GLYCOL 3350 17 G PO PACK
17.0000 g | PACK | Freq: Every day | ORAL | Status: DC
  Administered 2021-03-13 – 2021-04-02 (×19): 17 g via ORAL
  Filled 2021-03-12 (×21): qty 1

## 2021-03-12 MED ORDER — CYANOCOBALAMIN 1000 MCG/ML IJ SOLN
1000.0000 ug | Freq: Every day | INTRAMUSCULAR | Status: AC
  Administered 2021-03-13 – 2021-03-14 (×2): 1000 ug via INTRAMUSCULAR
  Filled 2021-03-12 (×3): qty 1

## 2021-03-12 NOTE — H&P (Signed)
Physical Medicine and Rehabilitation Admission H&P        Chief Complaint  Patient presents with   Cerebrovascular Accident  : HPI: [name]. [name] is a 87 year old right-handed male with history of Alzheimer's dementia maintained on Aricept, type 2 diabetes mellitus, hypertension, CKD stage III, macular degeneration, adenocarcinoma of left lung with resection 2001, prostate cancer with prostatectom 2003 y, CAD, quit smoking 36 years ago..  Per chart review patient lives with spouse as well as a live-in caregiver who assist with meal preparation and ADLs.  Patient using rolling walker prior to admission.  1 level home with ramped entrance.  He also has good outside extended family.  Presented 03/07/2021 with left-sided weakness x2 to 3 days.  Cranial CT scan negative.  MRI identified small foci of acute ischemia within the ventral right pons and left frontal periventricular white matter.  No hemorrhage or mass-effect.  MRA with no intracranial large vessel occlusion.  Patient did not receive tPA.  Admission chemistries unremarkable except potassium 5.2 glucose 229 creatinine 1.50, alcohol negative, hemoglobin A1c 8.3, urinalysis negative nitrite.  Echocardiogram with ejection fraction of 50 to 55% no wall motion abnormalities grade 1 diastolic dysfunction.  Neurology follow-up maintained on aspirin 81 mg daily and Plavix 75 mg daily for CVA prophylaxis x3 weeks then aspirin alone.  Lovenox for DVT prophylaxis.  Dysphagia #3 nectar thick liquid diet.  Therapy evaluations completed due to patient's left-sided weakness decreased functional mobility was admitted for a comprehensive rehab program.   Review of Systems  Constitutional:  Negative for chills and fever.  HENT:  Positive for hearing loss.   Eyes:  Negative for blurred vision and double vision.  Respiratory:  Negative for cough and shortness of breath.   Cardiovascular:  Negative for chest pain, palpitations and leg swelling.   Gastrointestinal:  Positive for constipation. Negative for heartburn, nausea and vomiting.  Genitourinary:  Positive for urgency. Negative for dysuria, flank pain and hematuria.  Musculoskeletal:  Positive for joint pain and myalgias.  Skin:  Negative for rash.  Psychiatric/Behavioral:  Positive for depression and memory loss.   All other systems reviewed and are negative.     Past Medical History:  Diagnosis Date   Adenocarcinoma of left lung, stage 1 ([place]) 2001    T1N0 stage I  adenoca left lung resected 01/03/00    Alzheimer's disease ([place])     CAD (coronary artery disease) 2014    a. 09/2012 Cath: LM nl, LAD 50-60p, D1 60-70 m, D1 50ost, LCX nl, RCA min irregs, EF 55-65%.   Generalized anxiety disorder 01/08/2007    Qualifier: Diagnosis of  By: Diona Browner MD, [name]     History of colonic polyps 07/25/2011   Macular degeneration of both eyes 01/21/2014   Nonmelanoma skin cancer 07/25/2011    Multiple lesions excised face/nose   Pericarditis      a. 09/2012 with effusion and tamponade, s/p window.  b.  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 SCC IN SITU TX CX3   SCC (squamous cell carcinoma) 04/12/2005    RIGHT NECK SUP. SCC IN SITU TX EXC   SCC (squamous cell carcinoma) 04/16/2013    LEFT TEMPLE SCC IN SITU TX CX3 5FU   SCC (squamous cell carcinoma) 04/16/2013     BELOW LEFT EYE SCC IN SITU TX CX3 5FU   SCC (squamous cell carcinoma) 04/16/2013    BELOW RIGHT EYE SCC IN SITU TX CX3 5FU   SCC (squamous cell carcinoma) 08/04/2014    LEFT CHEEK SCC IN SITU TX CX3 5FU   SCC (squamous cell carcinoma) 11/27/2018    LEFT TEMPLE SCC IN SITU TX WITH BX   SCC (squamous cell carcinoma)     SCC (squamous cell carcinoma)     SCC (squamous cell carcinoma)     SCC (squamous cell carcinoma) Well Diff 09/13/2016    Tip of Nose SCC WELL DIFF TX (MOH's), and RIGHT INNER EYE SUP. SCC IN SITU TX TO WATCH   SCC (squamous cell carcinoma) Well Diff 05/30/2017    Right Cheekbone (Cx3,5FU) and Under Left Eye (Cx3,5FU)   Squamous cell carcinoma in situ (SCCIS) 04/12/2005    Right Neck Inf (Cx3), Right Neck Sup (Exc), and Bulb of Nose (Cx3,Exc)   Squamous cell carcinoma in situ (SCCIS) 09/27/2005    Nose (Cx3,Exc)   Squamous cell carcinoma in situ (SCCIS) 02/18/2013    Below Left Eye (tx p bx)   Squamous cell carcinoma in situ (SCCIS) 04/16/2013    Left Temple (Cx3,5FU), Below Left Eye (Cx3,5FU), Below Right Eye (Cx3,5FU)   Squamous cell carcinoma in situ (SCCIS) 08/04/2014    Left Cheek (Cx3,5FU)   Squamous cell carcinoma in situ (SCCIS) 09/13/2016    Right Inner Eye Sup. 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   Type 2 diabetes mellitus with vascular disease ([place]) 05/21/2007    Qualifier: Diagnosis of  By: Diona Browner MD, [name]     Type 2 DM with CKD stage 3 and hypertension ([place]) 07/16/2015   Unspecified essential hypertension           Past Surgical History:  Procedure Laterality Date   HERNIA REPAIR       LEFT HEART CATHETERIZATION WITH CORONARY ANGIOGRAM N/A 09/13/2012     Procedure: LEFT HEART CATHETERIZATION WITH CORONARY ANGIOGRAM;  Surgeon: Sherren Mocha, MD;  Location: [facility] CATH LAB;  Service: Cardiovascular;  Laterality: N/A;   LOBECTOMY   2001    upper left   PERICARDIAL TAP N/A 09/16/2012    Procedure: PERICARDIAL TAP;  Surgeon: Sinclair Grooms, MD;  Location: [facility] CATH LAB;  Service: Cardiovascular;  Laterality: N/A;   PROSTATECTOMY   2003   SUBXYPHOID PERICARDIAL WINDOW N/A 09/16/2012    Procedure: SUBXYPHOID PERICARDIAL WINDOW;  Surgeon: Rexene Alberts, MD;  Location: [place];  Service: Thoracic;  Laterality: N/A;  TONSILLECTOMY             Family History  Problem Relation Age of Onset   Cancer Father          colon   Dementia Mother     Diabetes Mother     Cancer Brother          lung    Social History:  reports that he quit smoking about 36 years ago. His smoking use included cigarettes. He started smoking about 68 years ago. He has a 84.00 pack-year smoking history. He quit smokeless tobacco use about 34 years ago.  His smokeless tobacco use included chew. He reports that he does not drink alcohol and does not use drugs. Allergies:      Allergies  Allergen Reactions   Sulfa Antibiotics Nausea Only          Medications Prior to Admission  Medication Sig Dispense Refill   aspirin EC 81 MG tablet Take 81 mg by mouth daily as needed for moderate pain.       diphenhydrAMINE (BENADRYL) 25 MG tablet Take 25 mg by mouth daily.       donepezil (ARICEPT) 10 MG tablet TAKE 1 TABLET BY MOUTH EVERYDAY AT BEDTIME (Patient taking differently: Take 10 mg by mouth in the morning.) 90 tablet 0   glipiZIDE (GLUCOTROL) 5 MG tablet TAKE 1 TABLET BY MOUTH EVERY DAY BEFORE BREAKFAST 90 tablet 3   metFORMIN (GLUCOPHAGE-XR) 500 MG 24 hr tablet Take 500 mg by mouth in the morning and at bedtime.       Multiple Vitamins-Minerals (ICAPS LUTEIN & ZEAXANTHIN PO) Take 1 capsule by mouth in the morning and at bedtime. I - Caps       Omega-3 Fatty Acids (FISH OIL) 1000  MG CAPS Take 1,200 mg by mouth in the morning and at bedtime.       Propylene Glycol (SYSTANE BALANCE) 0.6 % SOLN Apply 1 drop to eye 2 (two) times daily as needed (dry eyes).       sertraline (ZOLOFT) 100 MG tablet TAKE 1 TABLET BY MOUTH EVERY DAY 90 tablet 3      Drug Regimen Review Drug regimen was reviewed and remains appropriate with no significant issues identified   Home: Home Living Family/patient expects to be discharged to:: Private residence Living Arrangements: Spouse/significant other Available Help at Discharge: Family, Personal care attendant, Available 24 hours/day Type of Home: House Home Access: Ramped entrance Home Layout: One level Bathroom Shower/Tub: Multimedia programmer: Handicapped height Home Equipment: Conservation officer, nature (2 wheels), Hospital bed, Grab bars - tub/shower, Shower seat, Grab bars - toilet Additional Comments: Has a live in caregiver   Functional History: Prior Function Prior Level of Function : Independent/Modified Independent Mobility Comments: Recently started using RW ADLs Comments: Caregiver helps with meal prep, IADLs.   Functional Status:  Mobility: Bed Mobility Overal bed mobility: Needs Assistance Bed Mobility: Supine to Sit Rolling: Min assist Sidelying to sit: Mod assist, HOB elevated Supine to sit: Mod assist Sit to supine: Min assist General bed mobility comments: assist to guide LUE as rolling to right, LEs over EOB and to raise trunk, no assist to scoot hips out to EOB and get feet on the floor Transfers Overall transfer level: Needs assistance Equipment used: None Transfers: Sit to/from Stand Sit to Stand: Mod assist Bed to/from chair/wheelchair/BSC transfer type:: Step pivot Stand pivot transfers: Mod assist Step pivot transfers: Mod assist, +2 physical assistance, +2 safety/equipment General transfer comment: assist  to rise and steady; no need to block L knee.Stood from EOB x 3  reps Ambulation/Gait Ambulation/Gait assistance: Mod assist, +2 physical assistance Gait Distance (Feet): 5 Feet Assistive device: 2 person hand held assist Gait Pattern/deviations: Step-to pattern, Decreased step length - left, Decreased weight shift to left, Knee flexed in stance - left, Narrow base of support General Gait Details: pt able to advance LLE for small step forward, however requires assist to widen step (LLE tends to adduct); max cues for sequencing with pt getting impulsive and stepping prior to cues x 1 Pre-gait activities: side step to left (toward [facility]) with mod assist for advancing LLE and maintaining balance. 3 steps with each foot   ADL: ADL Overall ADL's : Needs assistance/impaired Eating/Feeding: Minimal assistance, Sitting Grooming: Minimal assistance, Sitting Upper Body Bathing: Moderate assistance, Sitting Lower Body Bathing: +2 for physical assistance, Total assistance, Sit to/from stand Upper Body Dressing : Moderate assistance, Sitting Lower Body Dressing: +2 for physical assistance, Sit to/from stand, Bed level, Total assistance Toileting- Clothing Manipulation and Hygiene: Total assistance, +2 for physical assistance, Bed level Toileting - Clothing Manipulation Details (indicate cue type and reason): changed soiled brief   Cognition: Cognition Overall Cognitive Status: History of cognitive impairments - at baseline Orientation Level: Oriented X4 Cognition Arousal/Alertness: Awake/alert Behavior During Therapy: WFL for tasks assessed/performed Overall Cognitive Status: History of cognitive impairments - at baseline General Comments: Dementia at baseline   Physical Exam: Blood pressure (!) 144/66, pulse 73, temperature (!) 97.4 F (36.3 C), temperature source Oral, resp. rate 20, height 5\' 9"  (1.753 m), weight 85.1 kg, SpO2 95 %. Physical Exam Constitutional:      General: He is not in acute distress. HENT:     Head: Normocephalic and atraumatic.      Right Ear: External ear normal.     Left Ear: External ear normal.     Nose: Nose normal.     Mouth/Throat:     Mouth: Mucous membranes are moist.     Pharynx: Oropharynx is clear.  Eyes:     Extraocular Movements: Extraocular movements intact.     Pupils: Pupils are equal, round, and reactive to light.  Cardiovascular:     Rate and Rhythm: Normal rate and regular rhythm.     Heart sounds: No murmur heard.   No gallop.  Pulmonary:     Effort: Pulmonary effort is normal. No respiratory distress.     Breath sounds: No wheezing or rales.  Abdominal:     General: There is no distension.     Palpations: Abdomen is soft.     Tenderness: There is no abdominal tenderness.  Musculoskeletal:     Cervical back: Normal range of motion and neck supple.     Comments: Full ROM, No pain with AROM or PROM in the neck, trunk, or extremities. Posture appropriate   Skin:    General: Skin is warm and dry.  Neurological:     Mental Status: He is alert.     Comments: Patient is alert.  Makes eye contact with examiner.  He was able to provide his name place, month, year, why he's here.  Follows basic commands. Left central 7 and tongue deviation. LUE 2/5 deltoid, 3/5 bicep, 2+ tricep, 1+ to 2- wrist/fingers. LLE 2+ to 3/5. RUE and RLE 4 to 4+/5 prox to distal. Sensory exam normal for light touch and pain in all 4 limbs. No limb ataxia or cerebellar signs. No abnormal tone appreciated.  No abnormal tone  Psychiatric:        Mood and Affect: Mood normal.        Behavior: Behavior normal.      Lab Results Last 48 Hours        Results for orders placed or performed during the hospital encounter of 03/07/21 (from the past 48 hour(s))  Glucose, capillary     Status: Abnormal    Collection Time: 03/10/21  7:24 AM  Result Value Ref Range    Glucose-Capillary 211 (H) 70 - 99 mg/dL      Comment: Glucose reference range applies only to samples taken after fasting for at least 8 hours.  Glucose, capillary      Status: Abnormal    Collection Time: 03/10/21 11:58 AM  Result Value Ref Range    Glucose-Capillary 240 (H) 70 - 99 mg/dL      Comment: Glucose reference range applies only to samples taken after fasting for at least 8 hours.  Glucose, capillary     Status: Abnormal    Collection Time: 03/10/21  5:16 PM  Result Value Ref Range    Glucose-Capillary 172 (H) 70 - 99 mg/dL      Comment: Glucose reference range applies only to samples taken after fasting for at least 8 hours.  Glucose, capillary     Status: Abnormal    Collection Time: 03/10/21  8:23 PM  Result Value Ref Range    Glucose-Capillary 224 (H) 70 - 99 mg/dL      Comment: Glucose reference range applies only to samples taken after fasting for at least 8 hours.  Basic metabolic panel     Status: Abnormal    Collection Time: 03/11/21  2:42 AM  Result Value Ref Range    Sodium 135 135 - 145 mmol/L    Potassium 4.4 3.5 - 5.1 mmol/L    Chloride 105 98 - 111 mmol/L    CO2 25 22 - 32 mmol/L    Glucose, Bld 170 (H) 70 - 99 mg/dL      Comment: Glucose reference range applies only to samples taken after fasting for at least 8 hours.    BUN 22 8 - 23 mg/dL    Creatinine, Ser 1.41 (H) 0.61 - 1.24 mg/dL    Calcium 8.7 (L) 8.9 - 10.3 mg/dL    GFR, Estimated 48 (L) >60 mL/min      Comment: (NOTE) Calculated using the CKD-EPI Creatinine Equation (2021)      Anion gap 5 5 - 15      Comment: Performed at [facility] [address] [place], [place] [address]  Magnesium     Status: None    Collection Time: 03/11/21  2:42 AM  Result Value Ref Range    Magnesium 2.0 1.7 - 2.4 mg/dL      Comment: Performed at [facility] [address] [place], [place] [address]  Glucose, capillary     Status: Abnormal    Collection Time: 03/11/21  8:09 AM  Result Value Ref Range    Glucose-Capillary 256 (H) 70 - 99 mg/dL      Comment: Glucose reference range applies only to samples taken after fasting for at least 8 hours.  Glucose,  capillary     Status: Abnormal    Collection Time: 03/11/21 12:47 PM  Result Value Ref Range    Glucose-Capillary 190 (H) 70 - 99 mg/dL      Comment: Glucose reference range applies only to samples taken after  fasting for at least 8 hours.  Glucose, capillary     Status: Abnormal    Collection Time: 03/11/21  4:58 PM  Result Value Ref Range    Glucose-Capillary 215 (H) 70 - 99 mg/dL      Comment: Glucose reference range applies only to samples taken after fasting for at least 8 hours.  Glucose, capillary     Status: Abnormal    Collection Time: 03/11/21  8:32 PM  Result Value Ref Range    Glucose-Capillary 201 (H) 70 - 99 mg/dL      Comment: Glucose reference range applies only to samples taken after fasting for at least 8 hours.      Imaging Results (Last 48 hours)  No results found.           Medical Problem List and Plan: 1.  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  Antithrombotics: -DVT/anticoagulation:  Pharmaceutical: Lovenox             -antiplatelet therapy: Aspirin 81 mg daily and Plavix 75 mg day x3 weeks then aspirin alone.  Plavix can be discontinued after last dose 03/29/2021 3. Pain Management: Tylenol as needed 4. Mood/dementia: Aricept 10 mg nightly, Zoloft 100 mg daily             -antipsychotic agents: N/A 5. Neuropsych: This patient is capable of making decisions on his own behalf. 6. Skin/Wound Care: Routine skin checks 7. Fluids/Electrolytes/Nutrition: Routine in and outs with follow-up chemistries 8.  Diabetes mellitus.  Hemoglobin A1c 8.3.  Semglee just adjusted to 10 units daily.  Check blood sugars before meals and at bedtime             -cbg's poorly controlled at present. Observe with recent semglee change 9.  Dysphagia.  Dysphagia #3 nectar thick liquid  diet.  Advance per speech therapy 10.  CKD stage IIIa.  Baseline creatinine 1.3-1.8. 11.  Macrocytic anemia/vitamin B12 deficiency.   -b12 injections x 5 days (thru 12/11)-->oral supplementation -Continue iron supplement for mild iron def anemia 12.  History of prostate cancer with prostatectomy.  Follow-up outpatient. 21.  History of adenocarcinoma left lung with resection 2001.  Follow-up outpatient 14.  Hyperlipidemia.  Lipitor 15.  Constipation.  Senokot S2 tabs twice daily, MiraLAX daily.   Lavon Paganini Angiulli, PA-C 03/12/2021   I have personally performed a face to face diagnostic evaluation of this patient and formulated the key components of the plan.  Additionally, I have personally reviewed laboratory data, imaging studies, as well as relevant notes and concur with the physician assistant's documentation above.  The patient's status has not changed from the original H&P.  Any changes in documentation from the acute care chart have been noted above.  Meredith Staggers, MD, Mell[facility]rown

## 2021-03-12 NOTE — Plan of Care (Signed)
Patient without significant changes this shift. No complaints voiced. All needs taken care of, will continue to monitor.   Problem: Safety: Goal: Ability to remain free from injury will improve Outcome: Progressing   Problem: Education: Goal: Knowledge of disease or condition will improve Outcome: Progressing   Problem: Coping: Goal: Will verbalize positive feelings about self Outcome: Progressing   Problem: Ischemic Stroke/TIA Tissue Perfusion: Goal: Complications of ischemic stroke/TIA will be minimized Outcome: Progressing

## 2021-03-12 NOTE — Discharge Summary (Signed)
[facility]  Physician Discharge Summary   Patient ID: [name] MRN: [id number] DOB/AGE: March 23, 1931 87 y.o.  Admit date: 03/07/2021 Discharge date:   03/12/2021   PCP: [name], MD  DISCHARGE DIAGNOSES:  Acute stroke History of dementia Chronic kidney disease stage IIIb Macrocytic anemia Vitamin B12 deficiency Diabetes mellitus type 2, uncontrolled with hyperglycemia Mild thrombocytopenia Generalized anxiety disorder   PATIENT BEING DISCHARGED TO [place] [facility]  RECOMMENDATIONS FOR OUTPATIENT FOLLOW UP: Plavix can be discontinued after the dose on December 26. Will need follow-up with neurology in 6 to 8 weeks.    Home Health: Going to [facility] Equipment/Devices: None  CODE STATUS: Full code  DISCHARGE CONDITION: fair  Diet recommendation:  Diet Orders (From admission, onward)     Start     Ordered   03/09/21 1239  DIET DYS 3 Room service appropriate? Yes with Assist; Fluid consistency: Nectar Thick  Diet effective now       Comments: May have ice chips between meals; meds whole with nectar thick liquids  Question Answer Comment  Room service appropriate? Yes with Assist   Fluid consistency: Nectar Thick      03/09/21 1239             INITIAL HISTORY: [name] is a 87 y.o. male with medical history significant for Alzheimer dementia, type 2 diabetes mellitus, essential hypertension, stage IIIb chronic kidney disease with baseline creatinine 1.5-1.9,  who is admitted to [facility] on 03/07/2021 with acute ischemic CVA after presenting from home to [facility] ED complaining of left-sided weakness.    Consultants: Neurology   Procedures: Transthoracic echocardiogram.  Carotid Doppler.    HOSPITAL COURSE:   Acute stroke Patient with left-sided hemiparesis and facial droop.  MRI confirmed stroke.  Seen by neurology. LDL is 83.  HbA1c 8.3.  Patient is on atorvastatin. Echocardiogram shows normal systolic  function with grade 1 diastolic dysfunction.  No embolic source identified.   No significant stenosis noted on carotid Doppler. Seen by physical therapy.  Inpatient rehabilitation is recommended.   Seen by speech therapy.  On dysphagia 3 diet with nectar thick liquids. Neurology recommended aspirin and Plavix for 3 weeks followed by aspirin alone. Neurological status is stable.  Left-sided deficits have slightly improved in the last 48 hours.   History of dementia Pleasantly confused.  Continue Aricept.     Acute on chronic kidney disease stage IIIb Old labs reviewed.  Creatinine has been fluctuating over the last several months occasionally as high as 1.8 and as low as 0.9.   Presented with creatinine of 1.3.  Went up slightly to 1.6.  Creatinine continues to be stable.  This could all be just his chronic kidney disease.  Renal function is stable.  Macrocytic anemia/vitamin B12 deficiency Anemia panel showed a B12 level of 179.  Started on cyanocobalamin injections, to be given for 5 days following which he will be on oral supplementation. Ferritin was 42 with a TIBC of 342 and iron of 77.  There could be an element of iron deficiency as well.  Started on iron supplementation as well.  Will need further work-up for iron deficiency in the outpatient setting. TSH was normal.   Hemoglobin has been stable.   Diabetes mellitus type 2, uncontrolled with hyperglycemia Patient on glipizide at home.  HbA1c is 8.3.  Patient was started on glargine.  Glargine dose was increased as of this morning.  Trends can be monitored and further dose adjustments  can be made accordingly.   Mild thrombocytopenia Monitor periodically.  Stable.   Generalized anxiety disorder Continue Zoloft.   Patient is stable.  Okay for discharge to inpatient rehabilitation.   PERTINENT LABS:  The results of significant diagnostics from this hospitalization (including imaging, microbiology, ancillary and laboratory) are  listed below for reference.    Microbiology: Recent Results (from the past 240 hour(s))  Resp Panel by RT-PCR (Flu A&B, Covid) Nasopharyngeal Swab     Status: None   Collection Time: 03/07/21  7:24 PM   Specimen: Nasopharyngeal Swab; Nasopharyngeal(NP) swabs in vial transport medium  Result Value Ref Range Status   SARS Coronavirus 2 by RT PCR NEGATIVE NEGATIVE Final    Comment: (NOTE) SARS-CoV-2 target nucleic acids are NOT DETECTED.  The SARS-CoV-2 RNA is generally detectable in upper respiratory specimens during the acute phase of infection. The lowest concentration of SARS-CoV-2 viral copies this assay can detect is 138 copies/mL. A negative result does not preclude SARS-Cov-2 infection and should not be used as the sole basis for treatment or other patient management decisions. A negative result may occur with  improper specimen collection/handling, submission of specimen other than nasopharyngeal swab, presence of viral mutation(s) within the areas targeted by this assay, and inadequate number of viral copies(<138 copies/mL). A negative result must be combined with clinical observations, patient history, and epidemiological information. The expected result is Negative.  Fact Sheet for Patients:  EntrepreneurPulse.com.au  Fact Sheet for Healthcare Providers:  IncredibleEmployment.be  This test is no t yet approved or cleared by the [place] FDA and  has been authorized for detection and/or diagnosis of SARS-CoV-2 by FDA under an Emergency Use Authorization (EUA). This EUA will remain  in effect (meaning this test can be used) for the duration of the COVID-19 declaration under Section 564(b)(1) of the Act, 21 U.S.C.section 360bbb-3(b)(1), unless the authorization is terminated  or revoked sooner.       Influenza A by PCR NEGATIVE NEGATIVE Final   Influenza B by PCR NEGATIVE NEGATIVE Final    Comment: (NOTE) The Xpert Xpress  SARS-CoV-2/FLU/RSV plus assay is intended as an aid in the diagnosis of influenza from Nasopharyngeal swab specimens and should not be used as a sole basis for treatment. Nasal washings and aspirates are unacceptable for Xpert Xpress SARS-CoV-2/FLU/RSV testing.  Fact Sheet for Patients: EntrepreneurPulse.com.au  Fact Sheet for Healthcare Providers: IncredibleEmployment.be  This test is not yet approved or cleared by the [place] FDA and has been authorized for detection and/or diagnosis of SARS-CoV-2 by FDA under an Emergency Use Authorization (EUA). This EUA will remain in effect (meaning this test can be used) for the duration of the COVID-19 declaration under Section 564(b)(1) of the Act, 21 U.S.C. section 360bbb-3(b)(1), unless the authorization is terminated or revoked.  Performed at [facility], [place] [address] [place], [place] [address]      Labs:  COVID-19 Labs  Recent Labs    03/10/21 0057  FERRITIN 42    Lab Results  Component Value Date   SARSCOV2NAA NEGATIVE 03/07/2021   SARSCOV2NAA NEGATIVE 05/18/2020   SARSCOV2NAA Detected (A) 02/11/2019      Basic Metabolic Panel: Recent Labs  Lab 03/08/21 0424 03/08/21 1037 03/09/21 0037 03/10/21 0057 03/11/21 0242  NA 139 135 134* 138 135  K 2.8* 4.6 4.6 4.7 4.4  CL 119* 105 103 104 105  CO2 17* 23 23 24 25   GLUCOSE 145* 278* 231* 186* 170*  BUN 14 17 22  23  22  CREATININE 0.90 1.30* 1.60* 1.54* 1.41*  CALCIUM 5.6* 8.7* 8.4* 8.6* 8.7*  MG 1.4*  --   --   --  2.0   Liver Function Tests: Recent Labs  Lab 03/07/21 1928 03/08/21 0424  AST 11* 6*  ALT 11 6  ALKPHOS 81 48  BILITOT 0.6 0.6  PROT 6.9 3.8*  ALBUMIN 3.8 2.1*    CBC: Recent Labs  Lab 03/07/21 1928 03/08/21 0553 03/09/21 0037 03/10/21 0057  WBC 7.6 6.2 6.6 6.6  NEUTROABS 5.4  --   --   --   HGB 13.1 11.7* 11.7* 12.3*  HCT 39.1 35.6* 34.9* 36.4*  MCV 100.8* 100.3* 100.0 99.7  PLT  121* 105* 117* 113*   CBG: Recent Labs  Lab 03/10/21 2023 03/11/21 0809 03/11/21 1247 03/11/21 1658 03/11/21 2032  GLUCAP 224* 256* 190* 215* 201*     IMAGING STUDIES CT Head Wo Contrast  Result Date: 03/07/2021 CLINICAL DATA:  Altered mental status stroke suspected. EXAM: CT HEAD WITHOUT CONTRAST TECHNIQUE: Contiguous axial images were obtained from the base of the skull through the vertex without intravenous contrast. COMPARISON:  No comparison imaging is available, reports from 2004 are noted and reports from 2001 as well. FINDINGS: Brain: No evidence of acute infarction, hemorrhage, hydrocephalus, extra-axial collection or mass lesion/mass effect. Severe cerebral atrophy. Atrophy reported on previous imaging as severe. Vascular: No hyperdense vessel or unexpected calcification. Skull: Normal. Negative for fracture or focal lesion. Sinuses/Orbits: Visualized paranasal sinuses and orbits without acute process. Other: None. IMPRESSION: No acute intracranial pathology. Severe cerebral atrophy. Electronically Signed   By: Zetta Bills M.D.   On: 03/07/2021 19:57   MR ANGIO HEAD WO CONTRAST  Result Date: 03/08/2021 CLINICAL DATA:  Follow-up stroke. Left-sided weakness and facial droop. Pint teen and left frontal infarctions. EXAM: MRA HEAD WITHOUT CONTRAST TECHNIQUE: Angiographic images of the Circle of Willis were acquired using MRA technique without intravenous contrast. COMPARISON:  MRI earlier same day FINDINGS: Anterior circulation: Both internal carotid arteries are widely patent through the skull base and siphon regions. The anterior and middle cerebral vessels are patent without proximal stenosis, aneurysm or vascular malformation. Posterior circulation: Both vertebral arteries are patent through the foramen magnum to the basilar. No V4 segment stenosis. The basilar artery is tortuous but widely patent. Both superior cerebellar arteries show flow. Both posterior cerebral arteries show  flow. The left posterior cerebral artery receives most of it supply from the anterior circulation with a small contribution from the basilar tip. The right posterior cerebral artery arises from the basilar tip and shows a severe stenosis 1.5 cm beyond its origin. Anatomic variants: None other significant. Other: None. IMPRESSION: No intracranial large vessel occlusion. Both vertebral arteries are patent to the basilar. No basilar stenosis. Severe stenosis of the right posterior cerebral artery 1.5 cm beyond its origin, but patent beyond that. Left PCA receives most of it supply from the anterior circulation but does have a small contribution from the basilar tip. No significant anterior circulation finding. Electronically Signed   By: Nelson Chimes M.D.   On: 03/08/2021 08:05   MR BRAIN WO CONTRAST  Result Date: 03/08/2021 CLINICAL DATA:  Left-sided weakness with facial droop EXAM: MRI HEAD WITHOUT CONTRAST TECHNIQUE: Multiplanar, multiecho pulse sequences of the brain and surrounding structures were obtained without intravenous contrast. COMPARISON:  None. FINDINGS: Brain: There is a small focus of abnormal diffusion restriction the ventral right pons. There is also a punctate focus of diffusion restriction in the  left frontal periventricular white matter. No acute or chronic hemorrhage. There is multifocal hyperintense T2-weighted signal within the white matter. Diffuse, severe atrophy, worst in the frontal and parietal lobes. Bilateral subdural hygromas. The midline structures are normal. Vascular: Major flow voids are preserved. Skull and upper cervical spine: Normal calvarium and skull base. Visualized upper cervical spine and soft tissues are normal. Sinuses/Orbits:No paranasal sinus fluid levels or advanced mucosal thickening. No mastoid or middle ear effusion. Normal orbits. IMPRESSION: 1. Small foci of acute ischemia within the ventral right pons and left frontal periventricular white matter. No  hemorrhage or mass effect. 2. Diffuse, severe atrophy, worst in the frontal and parietal lobes. 3. Bilateral subdural hygromas. Electronically Signed   By: Ulyses Jarred M.D.   On: 03/08/2021 00:30   DG Chest Portable 1 View  Result Date: 03/07/2021 CLINICAL DATA:  Shortness of breath. Left-sided weakness. Stroke presentation. EXAM: PORTABLE CHEST 1 VIEW COMPARISON:  05/18/2020 FINDINGS: The right lung is clear. There is probably been lobectomy on the left. There is chronic pleural and parenchymal scarring on the left. No sign of active infiltrate, mass, effusion or collapse. No acute bone finding. IMPRESSION: Status post lobectomy on the left with chronic scarring. No active process evident. Electronically Signed   By: Nelson Chimes M.D.   On: 03/07/2021 20:10   DG Swallowing Func-Speech Pathology  Result Date: 03/08/2021 Table formatting from the original result was not included. Objective Swallowing Evaluation: Type of Study: MBS-Modified Barium Swallow Study  Patient Details Name: ROLLAND STEINERT MRN: [id number] Date of Birth: 06-02-1930 Today's Date: 03/08/2021 Time: SLP Start Time (ACUTE ONLY): 37 -SLP Stop Time (ACUTE ONLY): 1320 SLP Time Calculation (min) (ACUTE ONLY): 20 min Past Medical History: Past Medical History: Diagnosis Date  Adenocarcinoma of left lung, stage 1 ([place]) 2001  T1N0 stage I  adenoca left lung resected 01/03/00   Alzheimer's disease ([place])   CAD (coronary artery disease) 2014  a. 09/2012 Cath: LM nl, LAD 50-60p, D1 60-70 m, D1 50ost, LCX nl, RCA min irregs, EF 55-65%.  Generalized anxiety disorder 01/08/2007  Qualifier: Diagnosis of  By: Diona Browner MD, [name]    History of colonic polyps 07/25/2011  Macular degeneration of both eyes 01/21/2014  Nonmelanoma skin cancer 07/25/2011  Multiple lesions excised face/nose  Pericarditis   a. 09/2012 with effusion and tamponade, s/p window.  b.  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 SCC IN SITU TX CX3  SCC (squamous cell carcinoma) 04/12/2005  RIGHT NECK SUP. SCC IN SITU TX EXC  SCC (squamous cell carcinoma) 04/16/2013  LEFT TEMPLE SCC IN SITU TX CX3 5FU  SCC (squamous cell carcinoma) 04/16/2013  BELOW LEFT EYE SCC IN SITU TX CX3 5FU  SCC (squamous cell carcinoma) 04/16/2013  BELOW RIGHT EYE SCC IN SITU TX CX3 5FU  SCC (squamous cell carcinoma) 08/04/2014  LEFT CHEEK SCC IN SITU TX CX3 5FU  SCC (squamous cell carcinoma) 11/27/2018  LEFT TEMPLE SCC IN SITU TX WITH BX  SCC (squamous cell carcinoma)   SCC (squamous cell carcinoma)   SCC (squamous cell carcinoma)   SCC (squamous cell carcinoma) Well Diff 09/13/2016  Tip of Nose SCC WELL DIFF TX (MOH's), and RIGHT INNER EYE SUP. 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  Type 2 diabetes mellitus with vascular disease ([place]) 05/21/2007  Qualifier: Diagnosis of  By: Diona Browner MD, [name]    Type 2 DM with CKD stage 3 and hypertension ([place]) 07/16/2015  Unspecified essential hypertension  Past Surgical History: Past Surgical History: Procedure Laterality Date  HERNIA REPAIR    LEFT HEART CATHETERIZATION WITH CORONARY ANGIOGRAM N/A 09/13/2012  Procedure: LEFT HEART CATHETERIZATION WITH CORONARY ANGIOGRAM;  Surgeon: Sherren Mocha, MD;  Location: [facility] CATH LAB;  Service: Cardiovascular;  Laterality: N/A;  LOBECTOMY  2001  upper left  PERICARDIAL TAP N/A 09/16/2012  Procedure: PERICARDIAL TAP;  Surgeon: Sinclair Grooms, MD;  Location: [facility] CATH LAB;  Service: Cardiovascular;  Laterality: N/A;  PROSTATECTOMY  2003  SUBXYPHOID PERICARDIAL WINDOW N/A 09/16/2012  Procedure: SUBXYPHOID PERICARDIAL WINDOW;  Surgeon: Rexene Alberts, MD;  Location: [facility] OR;  Service: Thoracic;  Laterality: N/A;  TONSILLECTOMY   HPI: Patient is an 87 y.o. male with PMH: Alzheimer's dementia, DM-2, essential HTN, stage IIIb chronic kidney disease who was admitted to [facility] [place] on 12/4 with acute ischemic CVA after presenting from home to [facility] ED c/o left sided weakness. CXR negative for any active processes, CT head negative for acute intracranial abnormality but MRI brain showing small foci of acute ischemia within ventral right pons and left forntal periventricular white matter; no hemorrhage or mass; diffuse, severe atrophy worst in frontal and parietal lobes.  Subjective: pleasant, alert, sitting in chair in radiology suite  Recommendations for follow up therapy are one component of a multi-disciplinary discharge planning process, led by  the attending physician.  Recommendations may be updated based on patient status, additional functional criteria and insurance authorization. Assessment / Plan / Recommendation Clinical Impressions 03/08/2021 Clinical Impression Patient presents with a mild oropharyngeal dysphagia as per this MBS. He exhibited mildly delayed mastication of regular solids and reduced anterior to posterior transit of puree solids, barium tablet and regular solids. Swallow was initiated at level of vallecular sinus with puree solids, nectar thick liquids and regular solids and initiated at level of almost at pyriform sinus with thin liquids.  Silent, trace aspiration occured during the swallow with each small sip of thin liquids (PAS 8) secondary to reduced airway protection. No aspiration or penetration observed with nectar thick liquids even with straw sips and taking barium tablet with nectar thick liquid sip. No significant amount of pharyngeal residuals remained post initial swallows. Appearance of prominent cricopharyngeal bar as well as questionable cervical osteophytes ( no radiologist present to confirm) resulted in mildly reduced transit but no retention of barium. No esophageal backflow observed and esophageal sweep did not reveal anything significant. SLP is recommending Dys 3 solids, nectar thick liquids at this time. SLP Visit Diagnosis Dysphagia, unspecified (R13.10) Attention and concentration deficit following -- Frontal lobe and executive function deficit following -- Impact on safety and function Mild aspiration risk;Moderate aspiration risk   Treatment Recommendations 03/08/2021 Treatment Recommendations Therapy as outlined in treatment plan below   Prognosis 03/08/2021 Prognosis for Safe Diet Advancement Good Barriers to Reach Goals Cognitive deficits Barriers/Prognosis Comment h/o Alzheimer's dementia Diet Recommendations 03/08/2021 SLP Diet Recommendations Dysphagia 3 (Mech soft) solids;Nectar thick liquid Liquid  Administration via Cup;Straw Medication Administration Whole meds with liquid Compensations Minimize environmental distractions;Slow rate;Small sips/bites Postural Changes --   Other Recommendations 03/08/2021 Recommended Consults -- Oral Care Recommendations Oral care BID Other Recommendations Order thickener from pharmacy;Prohibited food (jello, ice cream, thin soups);Remove water pitcher;Clarify dietary restrictions Follow Up Recommendations Acute inpatient rehab (3hours/day) Assistance recommended at discharge Frequent or constant Supervision/Assistance Functional Status Assessment Patient has had a recent decline in their functional status and demonstrates the ability to make significant improvements in function in a reasonable and predictable amount of time. Frequency and Duration  03/08/2021 Speech Therapy Frequency (ACUTE ONLY) min 2x/week Treatment Duration 1 week   Oral Phase 03/08/2021 Oral Phase Impaired Oral - Pudding Teaspoon -- Oral - Pudding Cup -- Oral - Honey Teaspoon -- Oral - Honey Cup -- Oral - Nectar Teaspoon -- Oral - Nectar Cup WFL Oral - Nectar Straw WFL Oral - Thin Teaspoon -- Oral - Thin Cup WFL Oral - Thin Straw -- Oral - Puree Delayed oral transit;Reduced posterior propulsion Oral - Mech Soft -- Oral - Regular Impaired mastication;Delayed oral transit;Reduced posterior propulsion Oral - Multi-Consistency -- Oral - Pill Delayed oral transit;Decreased bolus cohesion;Reduced posterior propulsion Oral Phase - Comment --  Pharyngeal Phase 03/08/2021 Pharyngeal Phase Impaired Pharyngeal- Pudding Teaspoon -- Pharyngeal -- Pharyngeal- Pudding Cup -- Pharyngeal -- Pharyngeal- Honey Teaspoon -- Pharyngeal -- Pharyngeal- Honey Cup -- Pharyngeal -- Pharyngeal- Nectar Teaspoon -- Pharyngeal -- Pharyngeal- Nectar Cup Delayed swallow initiation-vallecula Pharyngeal -- Pharyngeal- Nectar Straw Delayed swallow initiation-vallecula Pharyngeal -- Pharyngeal- Thin Teaspoon -- Pharyngeal -- Pharyngeal- Thin Cup  Delayed swallow initiation-vallecula;Delayed swallow initiation-pyriform sinuses;Reduced airway/laryngeal closure;Penetration/Aspiration during swallow;Trace aspiration Pharyngeal Material enters airway, passes BELOW cords without attempt by patient to eject out (silent aspiration) Pharyngeal- Thin Straw -- Pharyngeal -- Pharyngeal- Puree Delayed swallow initiation-vallecula Pharyngeal -- Pharyngeal- Mechanical Soft -- Pharyngeal -- Pharyngeal- Regular Delayed swallow initiation-vallecula Pharyngeal -- Pharyngeal- Multi-consistency -- Pharyngeal -- Pharyngeal- Pill WFL Pharyngeal -- Pharyngeal Comment --  Cervical Esophageal Phase  03/08/2021 Cervical Esophageal Phase Impaired Pudding Teaspoon -- Pudding Cup -- Honey Teaspoon -- Honey Cup -- Nectar Teaspoon -- Nectar Cup Prominent cricopharyngeal segment Nectar Straw Prominent cricopharyngeal segment Thin Teaspoon -- Thin Cup Prominent cricopharyngeal segment Thin Straw -- Puree Prominent cricopharyngeal segment Mechanical Soft -- Regular Prominent cricopharyngeal segment Multi-consistency -- Pill Prominent cricopharyngeal segment Cervical Esophageal Comment -- Sonia Baller, MA, CCC-SLP Speech Therapy  ECHOCARDIOGRAM COMPLETE  Result Date: 03/08/2021    ECHOCARDIOGRAM REPORT   Patient Name:   [name] Date of Exam: 03/08/2021 Medical Rec #:  [id number]        Height:       69.0 in Accession #:    [id number]       Weight:       182.0 lb Date of Birth:  [birth year]        BSA:          1.985 m Patient Age:    74 years         BP:           171/84 mmHg Patient Gender: M                HR:           72 bpm. Exam Location:  Inpatient Procedure: 2D Echo, Cardiac Doppler and Color Doppler Indications:    stroke  History:        Patient has no prior history of Echocardiogram examinations.                 Stroke; Risk Factors:Diabetes and Hypertension.  Sonographer:    Melissa Morford RDCS (AE, PE) Referring Phys: [id number] Rhetta Mura   Sonographer Comments: Technically difficult study due to poor echo windows. IMPRESSIONS  1. Left ventricular ejection fraction, by estimation, is 50 to 55%. The left ventricle has low normal function. The left ventricle has no regional wall motion abnormalities. There is moderate asymmetric left ventricular hypertrophy of the basal-septal segment. Left ventricular diastolic parameters are consistent with Grade I diastolic dysfunction (impaired relaxation).  2. Right ventricular systolic function is mildly reduced. The right ventricular size is normal.  3. The mitral valve is normal in structure. No evidence of mitral valve regurgitation. No evidence of mitral stenosis.  4. The aortic valve is tricuspid. Aortic valve regurgitation is trivial. No aortic stenosis is present.  5. The inferior vena cava is normal in size with greater than 50% respiratory variability, suggesting right atrial pressure of 3 mmHg. FINDINGS  Left Ventricle: Left ventricular ejection fraction, by estimation, is 50 to 55%. The left ventricle has low normal function. The left ventricle has no regional wall motion abnormalities. The left ventricular internal cavity size was normal in size. There is moderate asymmetric left ventricular hypertrophy of the basal-septal segment. Left ventricular diastolic parameters are consistent with Grade I diastolic dysfunction (impaired relaxation). Right Ventricle: The right ventricular size is normal. No increase in right ventricular wall thickness. Right ventricular systolic function is mildly reduced. The tricuspid regurgitant velocity is 1.68 m/s, and with an assumed right atrial pressure of 3 mmHg, the estimated right ventricular systolic pressure is 30.8 mmHg. Left Atrium: Left atrial size was normal in size. Right Atrium: Right atrial size was normal in size. Pericardium: There is no evidence of pericardial effusion. Mitral Valve: The mitral valve is normal in structure. No evidence of mitral valve  regurgitation. No evidence of mitral valve stenosis. Tricuspid Valve: The tricuspid valve is normal in structure. Tricuspid valve regurgitation is trivial. Aortic Valve: The aortic valve is tricuspid. Aortic valve regurgitation is trivial. No aortic stenosis is present. Pulmonic Valve: The pulmonic valve was grossly normal. Pulmonic valve regurgitation is trivial. Aorta: The aortic root and ascending aorta are structurally normal, with no evidence of dilitation. Venous: The inferior vena cava is normal in size with greater than 50% respiratory variability, suggesting right atrial pressure of 3  mmHg. IAS/Shunts: The interatrial septum was not well visualized.  LEFT VENTRICLE PLAX 2D LVIDd:         4.60 cm   Diastology LVIDs:         3.40 cm   LV e' medial:    6.20 cm/s LV PW:         1.30 cm   LV E/e' medial:  5.0 LV IVS:        1.30 cm   LV e' lateral:   10.90 cm/s LVOT diam:     2.30 cm   LV E/e' lateral: 2.9 LV SV:         58 LV SV Index:   29 LVOT Area:     4.15 cm  RIGHT VENTRICLE RV S prime:     9.79 cm/s TAPSE (M-mode): 1.3 cm LEFT ATRIUM             Index        RIGHT ATRIUM           Index LA diam:        3.80 cm 1.91 cm/m   RA Area:     14.10 cm LA Vol (A2C):   46.3 ml 23.33 ml/m  RA Volume:   37.90 ml  19.09 ml/m LA Vol (A4C):   18.9 ml 9.52 ml/m LA Biplane Vol: 30.7 ml 15.47 ml/m  AORTIC VALVE LVOT Vmax:   94.80 cm/s LVOT Vmean:  61.600 cm/s LVOT VTI:    0.140 m  AORTA Ao Root diam: 3.80 cm Ao Asc diam:  3.30 cm MITRAL VALVE               TRICUSPID VALVE MV Area (PHT): 5.27 cm    TR Peak grad:   11.3 mmHg MV Decel Time: 144 msec    TR Vmax:        168.00 cm/s MV E velocity: 31.13 cm/s MV A velocity: 91.30 cm/s  SHUNTS MV E/A ratio:  0.34        Systemic VTI:  0.14 m                            Systemic Diam: 2.30 cm Oswaldo Milian MD Electronically signed by Oswaldo Milian MD Signature Date/Time: 03/08/2021/1:30:23 PM    Final    VAS US CAROTID  Result Date: 03/08/2021 Carotid  Arterial Duplex Study Patient Name:  [name]  Date of Exam:   03/08/2021 Medical Rec #: [id number]         Accession #:    [id number] Date of Birth: [birth year]         Patient Gender: M Patient Age:   60 years Exam Location:  [facility] Procedure:      VAS US CAROTID Referring Phys: Babs Bertin --------------------------------------------------------------------------------  Indications:       CVA. Risk Factors:      Hypertension, hyperlipidemia, Diabetes, past history of                    smoking, coronary artery disease. Other Factors:     CKD3. Comparison Study:  No previous exams Performing Technologist: Jody Hill RVT, RDMS  Examination Guidelines: A complete evaluation includes B-mode imaging, spectral Doppler, color Doppler, and power Doppler as needed of all accessible portions of each vessel. Bilateral testing is considered an integral part of a complete examination. Limited examinations for reoccurring indications may be performed as noted.  Right Carotid Findings: +----------+--------+--------+--------+------------------+------------------+  PSV cm/sEDV cm/sStenosisPlaque DescriptionComments           +----------+--------+--------+--------+------------------+------------------+ CCA Prox  62      10                                intimal thickening +----------+--------+--------+--------+------------------+------------------+ CCA Distal63      10                                                   +----------+--------+--------+--------+------------------+------------------+ ICA Prox  73      12              heterogenous                         +----------+--------+--------+--------+------------------+------------------+ ICA Distal61      11                                                   +----------+--------+--------+--------+------------------+------------------+ ECA       78      0                                                     +----------+--------+--------+--------+------------------+------------------+ +----------+--------+-------+----------------+-------------------+           PSV cm/sEDV cmsDescribe        Arm Pressure (mmHG) +----------+--------+-------+----------------+-------------------+ [id number]            Multiphasic, WNL                    +----------+--------+-------+----------------+-------------------+ +---------+--------+--+--------+-+---------+ VertebralPSV cm/s44EDV cm/s7Antegrade +---------+--------+--+--------+-+---------+  Left Carotid Findings: +----------+--------+--------+--------+------------------+------------------+           PSV cm/sEDV cm/sStenosisPlaque DescriptionComments           +----------+--------+--------+--------+------------------+------------------+ CCA Prox  89      13                                intimal thickening +----------+--------+--------+--------+------------------+------------------+ CCA Distal68      9                                                    +----------+--------+--------+--------+------------------+------------------+ ICA Prox  70      14              calcific                             +----------+--------+--------+--------+------------------+------------------+ ICA Distal76      16                                                   +----------+--------+--------+--------+------------------+------------------+  ECA       62      0                                 shadowing          +----------+--------+--------+--------+------------------+------------------+ +----------+--------+--------+----------------+-------------------+           PSV cm/sEDV cm/sDescribe        Arm Pressure (mmHG) +----------+--------+--------+----------------+-------------------+ [id number]              Multiphasic, WNL                    +----------+--------+--------+----------------+-------------------+  +---------+--------+--+--------+-+---------+ VertebralPSV cm/s46EDV cm/s9Antegrade +---------+--------+--+--------+-+---------+   Summary: Right Carotid: The extracranial vessels were near-normal with only minimal wall                thickening or plaque. Left Carotid: The extracranial vessels were near-normal with only minimal wall               thickening or plaque. Vertebrals:  Bilateral vertebral arteries demonstrate antegrade flow. Subclavians: Normal flow hemodynamics were seen in bilateral subclavian              arteries. *See table(s) above for measurements and observations.  Electronically signed by Antony Contras MD on 03/08/2021 at 1:10:33 PM.    Final     DISCHARGE EXAMINATION: Vitals:   03/11/21 2035 03/12/21 0034 03/12/21 0500 03/12/21 0753  BP: (!) 144/66   (!) 150/66  Pulse: 73   70  Resp: 20   20  Temp: 98.4 F (36.9 C) (!) 97.4 F (36.3 C)  98.3 F (36.8 C)  TempSrc: Oral Oral  Oral  SpO2:    94%  Weight:   85.1 kg   Height:       General appearance: Awake alert.  In no distress Resp: Clear to auscultation bilaterally.  Normal effort Cardio: S1-S2 is normal regular.  No S3-S4.  No rubs murmurs or bruit GI: Abdomen is soft.  Nontender nondistended.  Bowel sounds are present normal.  No masses organomegaly Extremities: No edema.  Full range of motion of lower extremities. Neurologic: Left-sided hemiparesis   DISPOSITION: Inpatient rehabilitation      Current Inpatient Medications: Scheduled:  aspirin EC  81 mg Oral Daily   atorvastatin  20 mg Oral q1800   clopidogrel  75 mg Oral Daily   cyanocobalamin  1,000 mcg Intramuscular Daily   donepezil  10 mg Oral QHS   enoxaparin (LOVENOX) injection  40 mg Subcutaneous Q24H   ferrous sulfate  325 mg Oral Q breakfast   insulin aspart  0-9 Units Subcutaneous TID WC   insulin glargine-yfgn  10 Units Subcutaneous Daily   pantoprazole  40 mg Oral Daily   polyethylene glycol  17 g Oral Daily   senna-docusate  2 tablet  Oral BID   sertraline  100 mg Oral Daily   [START ON 03/15/2021] vitamin B-12  1,000 mcg Oral Daily   Continuous: [id number] **OR** acetaminophen, food thickener    TOTAL DISCHARGE TIME: 35 minutes  Mychael Smock [employer] on [email or web address]  03/12/2021, 10:17 AM

## 2021-03-12 NOTE — Progress Notes (Signed)
Inpatient Rehabilitation Admissions Coordinator   I have insurance approval and CIR bed to admit him to today. I met at bedside with patient and his son and they are in agreement. I have alerted acute team and TOC and will l make the arrangements to admit today.  Danne Baxter, RN, MSN Rehab Admissions Coordinator [phone] 03/12/2021 10:33 AM

## 2021-03-12 NOTE — Progress Notes (Signed)
Meredith Staggers, MD  Physician Physical Medicine and Rehabilitation PMR Pre-admission    Signed Date of Service:  03/12/2021 11:39 AM  Related encounter: ED to Hosp-Admission (Current) from 03/07/2021 in [facility]   Signed      Show:Clear all _0 Written_1 Templated_2 Copied  Added by: _3 Cristina Gong, RN_4 Meredith Staggers, MD  _5 Hover for details                                                                                                                                                                                                                                                                                                                                                                                                                                                          PMR Admission Coordinator Pre-Admission Assessment   Patient: [name] is an 87 y.o., male MRN: [id number] DOB: 25-Jan-1931 Height: _6  (175.3 cm) Weight: 85.1 kg   Insurance Information HMO:     PPO:      PCP:      IPA:      80/20:      OTHER:  PRIMARY: [employer] Medicare      Policy#: [id number]      Subscriber: pt CM Name: Lenna Sciara      Phone#: [phone]     Fax#:  [phone] Pre-Cert#: [id number] approved for 7 days with f/u with [facility] phone [phone] fax [phone]      Employer:  Benefits:  Phone #: [phone]     Name: 12/7 Eff. Date: 04/04/2020     Deduct: none      Out of Pocket Max: $4500      Life Max: none CIR: $295 co pay per day days 1 until 6      SNF: no copay days 1 until 20; $188 co pay per days 21 until 100 Outpatient: $35 per visit     Co-Pay: visits per medical neccesity Home Health: 100%      Co-Pay: visits per medical neccesity DME: 80%     Co-Pay: 20% Providers: in network  SECONDARY: none          Financial Counselor:       Phone#:    The Engineer, petroleum" for patients in Inpatient Rehabilitation Facilities with attached "Privacy Act [place] Records" was provided and verbally reviewed with: Patient and Family   Emergency Contact Information Contact Information       Name Relation Home Work Mobile    [place] Spouse [phone]        [facility] Daughter [phone]        Beryle Beams Daughter     [phone]    refael, fulop     [phone]         Current Medical History  Patient Admitting Diagnosis: CVA   History of Present Illness:  87 year old right-handed male with history of Alzheimer's dementia maintained on Aricept, type 2 diabetes mellitus, hypertension, CKD stage III, macular degeneration, adenocarcinoma of left lung with resection 2001, prostate cancer with prostatectomy 2003 y, CAD, quit smoking 36 years ago..  Per chart review patient lives with spouse as well as a live-in caregiver who assist with meal preparation and ADLs.   Presented 03/07/2021 with left-sided weakness x2 to 3 days.  Cranial CT scan negative.  MRI identified small foci of acute ischemia within the ventral right pons and left frontal periventricular white matter.  No hemorrhage or mass-effect.  MRA with no intracranial large vessel occlusion.  Patient did not receive tPA.  Admission chemistries unremarkable except potassium 5.2 glucose 229 creatinine 1.50, alcohol negative, hemoglobin A1c 8.3, urinalysis negative nitrite.  Echocardiogram with ejection fraction of 50 to 55% no wall motion abnormalities grade 1 diastolic dysfunction.  Neurology follow-up maintained on aspirin 81 mg daily and Plavix 75 mg daily for CVA prophylaxis x3 weeks then aspirin alone.  Lovenox for DVT prophylaxis.  Dysphagia #3 nectar thick liquid diet.     Complete NIHSS TOTAL: 2   Patient's medical record from [facility] has been reviewed by the rehabilitation  admission coordinator and physician.   Past Medical History      Past Medical History:  Diagnosis Date   Adenocarcinoma of left lung, stage 1 ([place]) 2001    T1N0 stage I  adenoca left lung resected 01/03/00    Alzheimer's disease ([place])     CAD (coronary artery disease) 2014    a. 09/2012 Cath: LM nl, LAD 50-60p, D1 60-70 m, D1 50ost, LCX nl, RCA min irregs, EF 55-65%.   Generalized anxiety disorder 01/08/2007    Qualifier: Diagnosis of  By: Diona Browner MD, [name]     History of colonic polyps 07/25/2011   Macular degeneration of both eyes 01/21/2014   Nonmelanoma skin cancer 07/25/2011    Multiple lesions excised face/nose   Pericarditis  a. 09/2012 with effusion and tamponade, s/p window.  b.  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 SCC IN SITU TX CX3   SCC (squamous cell carcinoma) 04/12/2005    RIGHT NECK SUP. SCC IN SITU TX EXC   SCC (squamous cell carcinoma) 04/16/2013    LEFT TEMPLE SCC IN SITU TX CX3 5FU   SCC (squamous cell carcinoma) 04/16/2013    BELOW LEFT EYE SCC IN SITU TX CX3 5FU   SCC (squamous cell carcinoma) 04/16/2013    BELOW RIGHT EYE SCC IN SITU TX CX3 5FU   SCC (squamous cell carcinoma) 08/04/2014    LEFT CHEEK SCC IN SITU TX CX3 5FU   SCC (squamous cell carcinoma) 11/27/2018    LEFT TEMPLE SCC IN SITU TX WITH BX   SCC (squamous cell carcinoma)      SCC (squamous cell carcinoma)     SCC (squamous cell carcinoma)     SCC (squamous cell carcinoma) Well Diff 09/13/2016    Tip of Nose SCC WELL DIFF TX (MOH's), and RIGHT INNER EYE SUP. SCC IN SITU TX TO WATCH   SCC (squamous cell carcinoma) Well Diff 05/30/2017    Right Cheekbone (Cx3,5FU) and Under Left Eye (Cx3,5FU)   Squamous cell carcinoma in situ (SCCIS) 04/12/2005    Right Neck Inf (Cx3), Right Neck Sup (Exc), and Bulb of Nose (Cx3,Exc)   Squamous cell carcinoma in situ (SCCIS) 09/27/2005    Nose (Cx3,Exc)   Squamous cell carcinoma in situ (SCCIS) 02/18/2013    Below Left Eye (tx p bx)   Squamous cell carcinoma in situ (SCCIS) 04/16/2013    Left Temple (Cx3,5FU), Below Left Eye (Cx3,5FU), Below Right Eye (Cx3,5FU)   Squamous cell carcinoma in situ (SCCIS) 08/04/2014    Left Cheek (Cx3,5FU)   Squamous cell carcinoma in situ (SCCIS) 09/13/2016    Right Inner Eye Sup. 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 No   Family History   family history includes Cancer in his brother and father;  Dementia in his mother; Diabetes in his mother.   Current Medications   Current Facility-Administered Medications:    acetaminophen (TYLENOL) tablet 650 mg, 650 mg, Oral, Q6H PRN **OR** acetaminophen (TYLENOL) suppository 650 mg, 650 mg, Rectal, Q6H PRN, Howerter, Justin B, DO   aspirin EC tablet 81 mg, 81 mg, Oral,  Daily, Howerter, Justin B, DO, 81 mg at 03/12/21 0959   atorvastatin (LIPITOR) tablet 20 mg, 20 mg, Oral, q1800, de Yolanda Manges, Cortney E, NP, 20 mg at 03/11/21 1719   clopidogrel (PLAVIX) tablet 75 mg, 75 mg, Oral, Daily, de La Torre, [place] E, NP, 75 mg at 03/12/21 2440   cyanocobalamin ((VITAMIN B-12)) injection 1,000 mcg, 1,000 mcg, Intramuscular, Daily, Bonnielee Haff, MD, 1,000 mcg at 03/12/21 0959   donepezil (ARICEPT) tablet 10 mg, 10 mg, Oral, QHS, Howerter, Justin B, DO, 10 mg at 03/11/21 2112   enoxaparin (LOVENOX) injection 40 mg, 40 mg, Subcutaneous, Q24H, Bonnielee Haff, MD, 40 mg at 03/11/21 1612   ferrous sulfate tablet 325 mg, 325 mg, Oral, Q breakfast, Bonnielee Haff, MD, 325 mg at 03/12/21 1027   food thickener (SIMPLYTHICK (NECTAR/LEVEL 2/MILDLY THICK)) 10 packet, 10 packet, Oral, PRN, Bonnielee Haff, MD   insulin aspart (novoLOG) injection 0-9 Units, 0-9 Units, Subcutaneous, TID WC, Howerter, Justin B, DO, 3 Units at 03/12/21 1003   insulin glargine-yfgn (SEMGLEE) injection 10 Units, 10 Units, Subcutaneous, Daily, Bonnielee Haff, MD   pantoprazole (PROTONIX) EC tablet 40 mg, 40 mg, Oral, Daily, Howerter, Justin B, DO, 40 mg at 03/12/21 0958   polyethylene glycol (MIRALAX / GLYCOLAX) packet 17 g, 17 g, Oral, Daily, Bonnielee Haff, MD, 17 g at 03/12/21 2536   senna-docusate (Senokot-S) tablet 2 tablet, 2 tablet, Oral, BID, Bonnielee Haff, MD, 2 tablet at 03/12/21 6440   sertraline (ZOLOFT) tablet 100 mg, 100 mg, Oral, Daily, Howerter, Justin B, DO, 100 mg at 03/12/21 0958   [START ON 03/15/2021] vitamin B-12 (CYANOCOBALAMIN) tablet 1,000 mcg, 1,000 mcg, Oral, Daily, Bonnielee Haff, MD   Patients Current Diet:  Diet Order                  DIET DYS 3 Room service appropriate? Yes with Assist; Fluid consistency: Nectar Thick  Diet effective now                       Precautions / Restrictions Precautions Precautions: Fall Precaution Comments: son endorses a few  sporadic falls Restrictions Weight Bearing Restrictions: No    Has the patient had 2 or more falls or a fall with injury in the past year? No   Prior Activity Level Community (5-7x/wk): mod I with AD as needed   Prior Functional Level Self Care: Did the patient need help bathing, dressing, using the toilet or eating? Independent   Indoor Mobility: Did the patient need assistance with walking from room to room (with or without device)? Independent   Stairs: Did the patient need assistance with internal or external stairs (with or without device)? Independent   Functional Cognition: Did the patient need help planning regular tasks such as shopping or remembering to take medications? Needed some help   Patient Information Are you of Hispanic, Latino/a,or Spanish origin?: A. No, not of Hispanic, Latino/a, or Spanish origin What is your race?: A. White Do you need or want an interpreter to communicate with a doctor or health care staff?: 0. No   Patient's Response To:  Health Literacy and Transportation Is the patient able  to respond to health literacy and transportation needs?: Yes Health Literacy - How often do you need to have someone help you when you read instructions, pamphlets, or other written material from your doctor or pharmacy?: Never In the past 12 months, has lack of transportation kept you from medical appointments or from getting medications?: No In the past 12 months, has lack of transportation kept you from meetings, work, or from getting things needed for daily living?: No   Development worker, international aid / [place] Devices/Equipment: None Home Equipment: Conservation officer, nature (2 wheels), Hospital bed, Grab bars - tub/shower, Shower seat, Grab bars - toilet   Prior Device Use: Indicate devices/aids used by the patient prior to current illness, exacerbation or injury? Walker   Current Functional Level Cognition   Overall Cognitive Status: History of cognitive  impairments - at baseline Orientation Level: Oriented X4 General Comments: Dementia at baseline    Extremity Assessment (includes Sensation/Coordination)   Upper Extremity Assessment: Generalized weakness LUE Deficits / Details: 3/5 LUE Sensation: decreased proprioception LUE Coordination: decreased fine motor, decreased gross motor  Lower Extremity Assessment: LLE deficits/detail LLE Deficits / Details: 2+/5 throughout. Buckling noted during functional mobility tasks.     ADLs   Overall ADL's : Needs assistance/impaired Eating/Feeding: Minimal assistance, Sitting Grooming: Minimal assistance, Sitting Upper Body Bathing: Moderate assistance, Sitting Lower Body Bathing: +2 for physical assistance, Total assistance, Sit to/from stand Upper Body Dressing : Moderate assistance, Sitting Lower Body Dressing: +2 for physical assistance, Sit to/from stand, Bed level, Total assistance Toileting- Clothing Manipulation and Hygiene: Total assistance, +2 for physical assistance, Bed level Toileting - Clothing Manipulation Details (indicate cue type and reason): changed soiled brief     Mobility   Overal bed mobility: Needs Assistance Bed Mobility: Supine to Sit Rolling: Min assist Sidelying to sit: Mod assist, HOB elevated Supine to sit: Mod assist Sit to supine: Min assist General bed mobility comments: assist to guide LUE as rolling to right, LEs over EOB and to raise trunk, no assist to scoot hips out to EOB and get feet on the floor     Transfers   Overall transfer level: Needs assistance Equipment used: None Transfers: Sit to/from Stand Sit to Stand: Mod assist Bed to/from chair/wheelchair/BSC transfer type:: Step pivot Stand pivot transfers: Mod assist Step pivot transfers: Mod assist, +2 physical assistance, +2 safety/equipment General transfer comment: assist to rise and steady; no need to block L knee.Stood from EOB x 3 reps     Ambulation / Gait / Stairs / Wheelchair  Mobility   Ambulation/Gait Ambulation/Gait assistance: Mod assist, +2 physical assistance Gait Distance (Feet): 5 Feet Assistive device: 2 person hand held assist Gait Pattern/deviations: Step-to pattern, Decreased step length - left, Decreased weight shift to left, Knee flexed in stance - left, Narrow base of support General Gait Details: pt able to advance LLE for small step forward, however requires assist to widen step (LLE tends to adduct); max cues for sequencing with pt getting impulsive and stepping prior to cues x 1 Pre-gait activities: side step to left (toward [facility]) with mod assist for advancing LLE and maintaining balance. 3 steps with each foot     Posture / Balance Dynamic Sitting Balance Sitting balance - Comments: able to maintain midline with min guard, however one episode of LOB to hiR side, min A to correct Balance Overall balance assessment: Needs assistance Sitting-balance support: No upper extremity supported, Feet supported Sitting balance-Leahy Scale: Fair Sitting balance - Comments: able to maintain  midline with min guard, however one episode of LOB to hiR side, min A to correct Postural control: Posterior lean Standing balance support: Bilateral upper extremity supported Standing balance-Leahy Scale: Poor     Special needs/care consideration Hgb A1c 8.3    Previous Home Environment  Living Arrangements: Spouse/significant other Curly Shores, is a live in caregiver for his wife, but she does do meal prep fo rhim)  Lives With: Spouse, Other (Comment) (wife's caregiver, Curly Shores, is a live in for over a year) Available Help at Discharge:  (3 children to help during the day, and Juanita, will be there at night) Type of Home: House Home Layout: One level Home Access: Ramped entrance Bathroom Shower/Tub: Multimedia programmer: Handicapped height Bathroom Accessibility: Yes How Accessible: Accessible via walker [place]: No Additional Comments:  Juanita, Live in caregiver is for his wife   Discharge Living Setting Plans for Discharge Living Setting: Patient's home, Lives with (comment) (wife and live in caregiver of wife's) Type of Home at Discharge: House Discharge Home Layout: One level Discharge Home Access: [place] entrance Discharge Bathroom Shower/Tub: Walk-in shower Discharge Bathroom Toilet: Handicapped height Discharge Bathroom Accessibility: Yes How Accessible: Accessible via walker Does the patient have any problems obtaining your medications?: No   Social/Family/Support Systems Patient Roles: Spouse, Partner Contact Information: daughter, Tammy Anticipated Caregiver: children during the day and then Juanits, wife's caregiver, during the night to asisst Anticipated Caregiver's Contact Information: see contacts Ability/Limitations of Caregiver: children to rotate asisst, Juanita, wife's caregiver is live in Caregiver Availability: 24/7 Discharge Plan Discussed with Primary Caregiver: Yes Is Caregiver In Agreement with Plan?: Yes Does Caregiver/Family have Issues with Lodging/Transportation while Pt is in Rehab?: No   Goals Patient/Family Goal for Rehab: supervision to min asisst with PT and OT, supervision to mod I with SLP Expected length of stay: ELOS 10 to 12 days Additional Information: Patient with his children make his decisions, not his wife Pt/Family Agrees to Admission and willing to participate: Yes Program Orientation Provided & Reviewed with Pt/Caregiver Including Roles  & Responsibilities: Yes   Decrease burden of Care through IP rehab admission: n/a   Possible need for SNF placement upon discharge: not anticipated   Patient Condition: I have reviewed medical records from [facility], spoken with CM, and patient, son, and daughter. I met with patient at the bedside for inpatient rehabilitation assessment.  Patient will benefit from ongoing PT, OT, and SLP, can actively participate in 3 hours  of therapy a day 5 days of the week, and can make measurable gains during the admission.  Patient will also benefit from the coordinated team approach during an Inpatient Acute Rehabilitation admission.  The patient will receive intensive therapy as well as Rehabilitation physician, nursing, social worker, and care management interventions.  Due to bladder management, bowel management, safety, skin/wound care, disease management, medication administration, pain management, and patient education the patient requires 24 hour a day rehabilitation nursing.  The patient is currently mod assist overall with mobility and basic ADLs.  Discharge setting and therapy post discharge at home with home health is anticipated.  Patient has agreed to participate in the Acute Inpatient Rehabilitation Program and will admit today.   Preadmission Screen Completed By:  Cleatrice Burke, 03/12/2021 11:40 AM ______________________________________________________________________   Discussed status with Dr. Naaman Plummer on 03/12/2021 at 1145 and received approval for admission today.   Admission Coordinator:  Cleatrice Burke, RN, time  3244 Date  03/12/2021    Assessment/Plan: Diagnosis:  right ventral pons, left frontal PVWM infarcts Does the need for close, 24 hr/day Medical supervision in concert with the patient's rehab needs make it unreasonable for this patient to be served in a less intensive setting? Yes Co-Morbidities requiring supervision/potential complications: alzheimer's dementia, ckdiii, dm, htn Due to bladder management, bowel management, safety, skin/wound care, disease management, medication administration, pain management, and patient education, does the patient require 24 hr/day rehab nursing? Yes Does the patient require coordinated care of a physician, rehab nurse, PT, OT, and SLP to address physical and functional deficits in the context of the above medical diagnosis(es)? Yes Addressing deficits  in the following areas: balance, endurance, locomotion, strength, transferring, bowel/bladder control, bathing, dressing, feeding, grooming, toileting, cognition, and psychosocial support Can the patient actively participate in an intensive therapy program of at least 3 hrs of therapy 5 days a week? Yes The potential for patient to make measurable gains while on inpatient rehab is excellent Anticipated functional outcomes upon discharge from inpatient rehab: supervision and min assist PT, supervision and min assist OT, modified independent SLP Estimated rehab length of stay to reach the above functional goals is: 10-12 days Anticipated discharge destination: Home 10. Overall Rehab/Functional Prognosis: excellent     MD Signature: Meredith Staggers, MD, [place] Director Rehabilitation Services 03/12/2021          Revision History                     Note Details  Author Meredith Staggers, MD File Time 03/12/2021 11:51 AM  Author Type Physician Status Signed  Last Editor Meredith Staggers, MD Service Physical Medicine and Rehabilitation

## 2021-03-12 NOTE — PMR Pre-admission (Signed)
PMR Admission Coordinator Pre-Admission Assessment  Patient: [name] is an 87 y.o., male MRN: [id number] DOB: Sep 01, 1930 Height: '5\' 9"'  (175.3 cm) Weight: 85.1 kg  Insurance Information HMO:     PPO:      PCP:      IPA:      80/20:      OTHER:  PRIMARY: [employer] Medicare      Policy#: [id number]      Subscriber: pt CM Name: Lenna Sciara      Phone#: [phone]     Fax#: [phone] Pre-Cert#: [id number] approved for 7 days with f/u with [facility] phone [phone] fax [phone]      Employer:  Benefits:  Phone #: [phone]     Name: 12/7 Eff. Date: 04/04/2020     Deduct: none      Out of Pocket Max: $4500      Life Max: none CIR: $295 co pay per day days 1 until 6      SNF: no copay days 1 until 20; $188 co pay per days 21 until 100 Outpatient: $35 per visit     Co-Pay: visits per medical neccesity Home Health: 100%      Co-Pay: visits per medical neccesity DME: 80%     Co-Pay: 20% Providers: in network  SECONDARY: none        Financial Counselor:       Phone#:   The Engineer, [employer]" for patients in Inpatient Rehabilitation Facilities with attached "Privacy Act [place] Records" was provided and verbally reviewed with: Patient and Family  Emergency Contact Information Contact Information     Name Relation Home Work Mobile   [place] Spouse [phone]     [facility] Daughter [phone]     Beryle Beams Daughter   [phone]   thien, berka   [phone]      Current Medical History  Patient Admitting Diagnosis: CVA  History of Present Illness:  87 year old right-handed male with history of Alzheimer's dementia maintained on Aricept, type 2 diabetes mellitus, hypertension, CKD stage III, macular degeneration, adenocarcinoma of left lung with resection 2001, prostate cancer with prostatectomy 2003 y, CAD, quit smoking 36 years ago..  Per chart review patient lives with spouse as well as a live-in caregiver who assist  with meal preparation and ADLs.   Presented 03/07/2021 with left-sided weakness x2 to 3 days.  Cranial CT scan negative.  MRI identified small foci of acute ischemia within the ventral right pons and left frontal periventricular white matter.  No hemorrhage or mass-effect.  MRA with no intracranial large vessel occlusion.  Patient did not receive tPA.  Admission chemistries unremarkable except potassium 5.2 glucose 229 creatinine 1.50, alcohol negative, hemoglobin A1c 8.3, urinalysis negative nitrite.  Echocardiogram with ejection fraction of 50 to 55% no wall motion abnormalities grade 1 diastolic dysfunction.  Neurology follow-up maintained on aspirin 81 mg daily and Plavix 75 mg daily for CVA prophylaxis x3 weeks then aspirin alone.  Lovenox for DVT prophylaxis.  Dysphagia #3 nectar thick liquid diet.     Complete NIHSS TOTAL: 2  Patient's medical record from [facility] has been reviewed by the rehabilitation admission coordinator and physician.  Past Medical History  Past Medical History:  Diagnosis Date   Adenocarcinoma of left lung, stage 1 ([place]) 2001   T1N0 stage I  adenoca left lung resected 01/03/00    Alzheimer's disease ([place])    CAD (coronary artery disease) 2014   a. 09/2012 Cath: LM nl, LAD 50-60p, D1 60-70 m,  D1 50ost, LCX nl, RCA min irregs, EF 55-65%.   Generalized anxiety disorder 01/08/2007   Qualifier: Diagnosis of  By: Diona Browner MD, [name]     History of colonic polyps 07/25/2011   Macular degeneration of both eyes 01/21/2014   Nonmelanoma skin cancer 07/25/2011   Multiple lesions excised face/nose   Pericarditis    a. 09/2012 with effusion and tamponade, s/p window.  b.  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 SCC IN SITU TX CX3   SCC (squamous cell carcinoma) 04/12/2005   RIGHT NECK SUP. SCC IN SITU TX EXC   SCC (squamous cell carcinoma) 04/16/2013   LEFT TEMPLE SCC IN SITU TX CX3 5FU   SCC (squamous cell carcinoma) 04/16/2013   BELOW LEFT EYE SCC IN SITU TX CX3 5FU   SCC (squamous cell carcinoma) 04/16/2013   BELOW RIGHT EYE SCC IN SITU TX CX3 5FU   SCC (squamous cell carcinoma) 08/04/2014   LEFT CHEEK SCC IN SITU TX CX3 5FU   SCC (squamous cell carcinoma) 11/27/2018   LEFT TEMPLE SCC IN SITU TX WITH BX   SCC (squamous cell carcinoma)    SCC (squamous cell carcinoma)    SCC (squamous cell carcinoma)    SCC (squamous cell carcinoma) Well Diff 09/13/2016   Tip of Nose SCC WELL DIFF TX (MOH's), and RIGHT INNER EYE SUP. SCC IN SITU TX TO WATCH   SCC (squamous cell carcinoma) Well Diff 05/30/2017   Right Cheekbone (Cx3,5FU) and Under Left Eye (Cx3,5FU)   Squamous cell carcinoma in situ (SCCIS) 04/12/2005   Right Neck Inf (Cx3), Right Neck Sup (Exc), and Bulb of Nose (Cx3,Exc)   Squamous cell carcinoma in situ (SCCIS) 09/27/2005   Nose (Cx3,Exc)   Squamous cell carcinoma in situ (SCCIS) 02/18/2013   Below Left Eye (tx p bx)   Squamous cell carcinoma in situ (SCCIS) 04/16/2013   Left Temple (Cx3,5FU), Below Left Eye (Cx3,5FU), Below Right Eye (Cx3,5FU)   Squamous cell carcinoma in situ (SCCIS) 08/04/2014   Left Cheek (Cx3,5FU)   Squamous cell carcinoma in situ (SCCIS) 09/13/2016   Right Inner Eye Sup. (Watch)   Squamous cell carcinoma in situ (SCCIS) Focal 08/24/2008   Right Outer Brow (tx p bx) and Below Left Eye (Cx3,5FU)   Squamous cell  carcinoma in situ (SCCIS) Hypertrophic 10/25/2006   Right Elbow (Cx3,5FU)   Squamous cell carcinoma of skin 09/27/2005   NOSE SCC IN SITU TC CX3 5FU   Tobacco abuse, 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/08/2013   Type 2 diabetes mellitus with vascular disease ([place]) 05/21/2007   Qualifier: Diagnosis of  By: Diona Browner MD, [name]     Type 2 DM with CKD stage 3 and hypertension ([place]) 07/16/2015   Unspecified essential hypertension  Has the patient had major surgery during 100 days prior to admission? No  Family History   family history includes Cancer in his brother and father; Dementia in his mother; Diabetes in his mother.  Current Medications  Current Facility-Administered Medications:    acetaminophen (TYLENOL) tablet 650 mg, 650 mg, Oral, Q6H PRN **OR** acetaminophen (TYLENOL) suppository 650 mg, 650 mg, Rectal, Q6H PRN, Howerter, Justin B, DO   aspirin EC tablet 81 mg, 81 mg, Oral, Daily, Howerter, Justin B, DO, 81 mg at 03/12/21 0959   atorvastatin (LIPITOR) tablet 20 mg, 20 mg, Oral, q1800, de Yolanda Manges, Cortney E, NP, 20 mg at 03/11/21 1719   clopidogrel (PLAVIX) tablet 75 mg, 75 mg, Oral, Daily, de La Torre, [place] E, NP, 75 mg at 03/12/21 4098   cyanocobalamin ((VITAMIN B-12)) injection 1,000 mcg, 1,000 mcg, Intramuscular, Daily, Bonnielee Haff, MD, 1,000 mcg at 03/12/21 0959   donepezil (ARICEPT) tablet 10 mg, 10 mg, Oral, QHS, Howerter, Justin B, DO, 10 mg at 03/11/21 2112   enoxaparin (LOVENOX) injection 40 mg, 40 mg, Subcutaneous, Q24H, Bonnielee Haff, MD, 40 mg at 03/11/21 1612   ferrous sulfate tablet 325 mg, 325 mg, Oral, Q breakfast, Bonnielee Haff, MD, 325 mg at 03/12/21 1191   food thickener (SIMPLYTHICK (NECTAR/LEVEL 2/MILDLY THICK)) 10 packet, 10 packet, Oral, PRN, Bonnielee Haff, MD   insulin aspart (novoLOG) injection 0-9 Units, 0-9 Units, Subcutaneous, TID WC, Howerter, Justin B, DO, 3 Units at 03/12/21 1003   insulin glargine-yfgn (SEMGLEE) injection 10 Units, 10 Units, Subcutaneous,  Daily, Bonnielee Haff, MD   pantoprazole (PROTONIX) EC tablet 40 mg, 40 mg, Oral, Daily, Howerter, Justin B, DO, 40 mg at 03/12/21 0958   polyethylene glycol (MIRALAX / GLYCOLAX) packet 17 g, 17 g, Oral, Daily, Bonnielee Haff, MD, 17 g at 03/12/21 4782   senna-docusate (Senokot-S) tablet 2 tablet, 2 tablet, Oral, BID, Bonnielee Haff, MD, 2 tablet at 03/12/21 9562   sertraline (ZOLOFT) tablet 100 mg, 100 mg, Oral, Daily, Howerter, Justin B, DO, 100 mg at 03/12/21 0958   [START ON 03/15/2021] vitamin B-12 (CYANOCOBALAMIN) tablet 1,000 mcg, 1,000 mcg, Oral, Daily, Bonnielee Haff, MD  Patients Current Diet:  Diet Order             DIET DYS 3 Room service appropriate? Yes with Assist; Fluid consistency: Nectar Thick  Diet effective now                  Precautions / Restrictions Precautions Precautions: Fall Precaution Comments: son endorses a few sporadic falls Restrictions Weight Bearing Restrictions: No   Has the patient had 2 or more falls or a fall with injury in the past year? No  Prior Activity Level Community (5-7x/wk): mod I with AD as needed  Prior Functional Level Self Care: Did the patient need help bathing, dressing, using the toilet or eating? Independent  Indoor Mobility: Did the patient need assistance with walking from room to room (with or without device)? Independent  Stairs: Did the patient need assistance with internal or external stairs (with or without device)? Independent  Functional Cognition: Did the patient need help planning regular tasks such as shopping or remembering to take medications? Needed some help  Patient Information Are you of Hispanic, Latino/a,or Spanish origin?: A. No, not of Hispanic, Latino/a, or Spanish origin What is your race?: A. White Do you need or want an interpreter to communicate with a doctor or health care staff?: 0. No  Patient's Response To:  Health Literacy and  Transportation Is the patient able to respond to  health literacy and transportation needs?: Yes Health Literacy - How often do you need to have someone help you when you read instructions, pamphlets, or other written material from your doctor or pharmacy?: Never In the past 12 months, has lack of transportation kept you from medical appointments or from getting medications?: No In the past 12 months, has lack of transportation kept you from meetings, work, or from getting things needed for daily living?: No  Development worker, international aid / [place] Devices/Equipment: None Home Equipment: Conservation officer, nature (2 wheels), Hospital bed, Grab bars - tub/shower, Shower seat, Grab bars - toilet  Prior Device Use: Indicate devices/aids used by the patient prior to current illness, exacerbation or injury? Walker  Current Functional Level Cognition  Overall Cognitive Status: History of cognitive impairments - at baseline Orientation Level: Oriented X4 General Comments: Dementia at baseline    Extremity Assessment (includes Sensation/Coordination)  Upper Extremity Assessment: Generalized weakness LUE Deficits / Details: 3/5 LUE Sensation: decreased proprioception LUE Coordination: decreased fine motor, decreased gross motor  Lower Extremity Assessment: LLE deficits/detail LLE Deficits / Details: 2+/5 throughout. Buckling noted during functional mobility tasks.    ADLs  Overall ADL's : Needs assistance/impaired Eating/Feeding: Minimal assistance, Sitting Grooming: Minimal assistance, Sitting Upper Body Bathing: Moderate assistance, Sitting Lower Body Bathing: +2 for physical assistance, Total assistance, Sit to/from stand Upper Body Dressing : Moderate assistance, Sitting Lower Body Dressing: +2 for physical assistance, Sit to/from stand, Bed level, Total assistance Toileting- Clothing Manipulation and Hygiene: Total assistance, +2 for physical assistance, Bed level Toileting - Clothing Manipulation Details (indicate cue type and  reason): changed soiled brief    Mobility  Overal bed mobility: Needs Assistance Bed Mobility: Supine to Sit Rolling: Min assist Sidelying to sit: Mod assist, HOB elevated Supine to sit: Mod assist Sit to supine: Min assist General bed mobility comments: assist to guide LUE as rolling to right, LEs over EOB and to raise trunk, no assist to scoot hips out to EOB and get feet on the floor    Transfers  Overall transfer level: Needs assistance Equipment used: None Transfers: Sit to/from Stand Sit to Stand: Mod assist Bed to/from chair/wheelchair/BSC transfer type:: Step pivot Stand pivot transfers: Mod assist Step pivot transfers: Mod assist, +2 physical assistance, +2 safety/equipment General transfer comment: assist to rise and steady; no need to block L knee.Stood from EOB x 3 reps    Ambulation / Gait / Stairs / Wheelchair Mobility  Ambulation/Gait Ambulation/Gait assistance: Mod assist, +2 physical assistance Gait Distance (Feet): 5 Feet Assistive device: 2 person hand held assist Gait Pattern/deviations: Step-to pattern, Decreased step length - left, Decreased weight shift to left, Knee flexed in stance - left, Narrow base of support General Gait Details: pt able to advance LLE for small step forward, however requires assist to widen step (LLE tends to adduct); max cues for sequencing with pt getting impulsive and stepping prior to cues x 1 Pre-gait activities: side step to left (toward [facility]) with mod assist for advancing LLE and maintaining balance. 3 steps with each foot    Posture / Balance Dynamic Sitting Balance Sitting balance - Comments: able to maintain midline with min guard, however one episode of LOB to hiR side, min A to correct Balance Overall balance assessment: Needs assistance Sitting-balance support: No upper extremity supported, Feet supported Sitting balance-Leahy Scale: Fair Sitting balance - Comments: able to maintain midline with min guard, however one  episode of  LOB to hiR side, min A to correct Postural control: Posterior lean Standing balance support: Bilateral upper extremity supported Standing balance-Leahy Scale: Poor    Special needs/care consideration Hgb A1c 8.3   Previous Home Environment  Living Arrangements: Spouse/significant other Curly Shores, is a live in caregiver for his wife, but she does do meal prep fo rhim)  Lives With: Spouse, Other (Comment) (wife's caregiver, Curly Shores, is a live in for over a year) Available Help at Discharge:  (3 children to help during the day, and Juanita, will be there at night) Type of Home: [place]: One level Home Access: Ramped entrance Bathroom Shower/Tub: Multimedia programmer: Handicapped height Bathroom Accessibility: Yes How Accessible: Accessible via walker [place]: No Additional Comments: Juanita, Live in caregiver is for his wife  Discharge Living Setting Plans for Discharge Living Setting: Patient's home, Lives with (comment) (wife and live in caregiver of wife's) Type of Home at Discharge: House Discharge Home Layout: One level Discharge Home Access: [place] entrance Discharge Bathroom Shower/Tub: Walk-in shower Discharge Bathroom Toilet: Handicapped height Discharge Bathroom Accessibility: Yes How Accessible: Accessible via walker Does the patient have any problems obtaining your medications?: No  Social/Family/Support Systems Patient Roles: Spouse, Partner Contact Information: daughter, Tammy Anticipated Caregiver: children during the day and then Juanits, wife's caregiver, during the night to asisst Anticipated Caregiver's Contact Information: see contacts Ability/Limitations of Caregiver: children to rotate asisst, Juanita, wife's caregiver is live in Caregiver Availability: 24/7 Discharge Plan Discussed with Primary Caregiver: Yes Is Caregiver In Agreement with Plan?: Yes Does Caregiver/Family have Issues with Lodging/Transportation  while Pt is in Rehab?: No  Goals Patient/Family Goal for Rehab: supervision to min asisst with PT and OT, supervision to mod I with SLP Expected length of stay: ELOS 10 to 12 days Additional Information: Patient with his children make his decisions, not his wife Pt/Family Agrees to Admission and willing to participate: Yes Program Orientation Provided & Reviewed with Pt/Caregiver Including Roles  & Responsibilities: Yes  Decrease burden of Care through IP rehab admission: n/a  Possible need for SNF placement upon discharge: not anticipated  Patient Condition: I have reviewed medical records from [facility], spoken with CM, and patient, son, and daughter. I met with patient at the bedside for inpatient rehabilitation assessment.  Patient will benefit from ongoing PT, OT, and SLP, can actively participate in 3 hours of therapy a day 5 days of the week, and can make measurable gains during the admission.  Patient will also benefit from the coordinated team approach during an Inpatient Acute Rehabilitation admission.  The patient will receive intensive therapy as well as Rehabilitation physician, nursing, social worker, and care management interventions.  Due to bladder management, bowel management, safety, skin/wound care, disease management, medication administration, pain management, and patient education the patient requires 24 hour a day rehabilitation nursing.  The patient is currently mod assist overall with mobility and basic ADLs.  Discharge setting and therapy post discharge at home with home health is anticipated.  Patient has agreed to participate in the Acute Inpatient Rehabilitation Program and will admit today.  Preadmission Screen Completed By:  Cleatrice Burke, 03/12/2021 11:40 AM ______________________________________________________________________   Discussed status with Dr. Naaman Plummer on 03/12/2021 at 1145 and received approval for admission today.  Admission  Coordinator:  Cleatrice Burke, RN, time  1145 Date  03/12/2021   Assessment/Plan: Diagnosis: right ventral pons, left frontal PVWM infarcts Does the need for close, 24 hr/day Medical supervision in concert with  the patient's rehab needs make it unreasonable for this patient to be served in a less intensive setting? Yes Co-Morbidities requiring supervision/potential complications: alzheimer's dementia, ckdiii, dm, htn Due to bladder management, bowel management, safety, skin/wound care, disease management, medication administration, pain management, and patient education, does the patient require 24 hr/day rehab nursing? Yes Does the patient require coordinated care of a physician, rehab nurse, PT, OT, and SLP to address physical and functional deficits in the context of the above medical diagnosis(es)? Yes Addressing deficits in the following areas: balance, endurance, locomotion, strength, transferring, bowel/bladder control, bathing, dressing, feeding, grooming, toileting, cognition, and psychosocial support Can the patient actively participate in an intensive therapy program of at least 3 hrs of therapy 5 days a week? Yes The potential for patient to make measurable gains while on inpatient rehab is excellent Anticipated functional outcomes upon discharge from inpatient rehab: supervision and min assist PT, supervision and min assist OT, modified independent SLP Estimated rehab length of stay to reach the above functional goals is: 10-12 days Anticipated discharge destination: Home 10. Overall Rehab/Functional Prognosis: excellent   MD Signature: Meredith Staggers, MD, [place] Director Rehabilitation Services 03/12/2021

## 2021-03-12 NOTE — Progress Notes (Signed)
Patient arrived to unit with family. Settled in and verbalizes agreement to call for assistance. Oriented to 4. Resting comfortably with call bell in place

## 2021-03-12 NOTE — Progress Notes (Signed)
Occupational Therapy Treatment Patient Details Name: [name] MRN: [id number] DOB: 05/03/1930 Today's Date: 03/12/2021   History of present illness Pt is an 87 year old man admitted on 03/07/21 with L side weakness. MRI + for small foci ischemia R pons and L frontal periventricular white matter. PMH. Alzheimers dementia, DM, HTN, stage 3 CKD, lung ca, prostate ca, multiple skin cancers, CAD, anxiety, macular degeneration.   OT comments  Pt sat EOB min A for L UE AAROM exercises, grooming/shaving with electric razor and UB ADLs simulated mod A and sitting balance/tolerance activities crossing midline. Pt very cooperative and motivated. OT will continue to follow acutely to maximize level of function and safety   Recommendations for follow up therapy are one component of a multi-disciplinary discharge planning process, led by the attending physician.  Recommendations may be updated based on patient status, additional functional criteria and insurance authorization.    Follow Up Recommendations  Acute inpatient rehab (3hours/day)    Assistance Recommended at Discharge Frequent or constant Supervision/Assistance  Equipment Recommendations  BSC/3in1;Wheelchair (measurements OT);Wheelchair cushion (measurements OT)    Recommendations for Other Services      Precautions / Restrictions Precautions Precautions: Fall Restrictions Weight Bearing Restrictions: No       Mobility Bed Mobility Overal bed mobility: Needs Assistance Bed Mobility: Supine to Sit;Sit to Supine     Supine to sit: Min assist Sit to supine: Min assist   General bed mobility comments: assist to guide LUE as rolling to right, LEs over EOB and to raise trunk, no assist to scoot hips out to EOB and get feet on the floor    Transfers                         Balance Overall balance assessment: Needs assistance Sitting-balance support: No upper extremity supported;Feet supported Sitting balance-Leahy  Scale: Fair                                     ADL either performed or assessed with clinical judgement   ADL Overall ADL's : Needs assistance/impaired Eating/Feeding: Set up;Supervision/ safety;Sitting   Grooming: Min guard;Wash/dry hands;Wash/dry face Grooming Details (indicate cue type and reason): pt used electric razor to shave face seated EOB with min guard A for balance/safety Upper Body Bathing: Moderate assistance;Sitting Upper Body Bathing Details (indicate cue type and reason): simulated seated EOB     Upper Body Dressing : Moderate assistance;Sitting                          Extremity/Trunk Assessment Upper Extremity Assessment Upper Extremity Assessment: Generalized weakness;LUE deficits/detail LUE Deficits / Details: 3-/5 LUE Sensation: decreased proprioception LUE Coordination: decreased fine motor;decreased gross motor            Vision Baseline Vision/History: 6 Macular Degeneration Patient Visual Report: No change from baseline     Perception     Praxis      Cognition Arousal/Alertness: Awake/alert Behavior During Therapy: WFL for tasks assessed/performed Overall Cognitive Status: History of cognitive impairments - at baseline                                 General Comments: Dementia at baseline          Exercises Other Exercises Other Exercises: [name]  L shoulder in all planes x 10 reps   Shoulder Instructions       General Comments      Pertinent Vitals/ Pain       Pain Assessment: No/denies pain Faces Pain Scale: No hurt  Home Living                                          Prior Functioning/Environment              Frequency  Min 3X/week        Progress Toward Goals  OT Goals(current goals can now be found in the care plan section)  Progress towards OT goals: Progressing toward goals     Plan Discharge plan remains appropriate    Co-evaluation                  AM-PAC OT "6 Clicks" Daily Activity     Outcome Measure   Help from another person eating meals?: A Little Help from another person taking care of personal grooming?: A Little Help from another person toileting, which includes using toliet, bedpan, or urinal?: Total Help from another person bathing (including washing, rinsing, drying)?: A Lot Help from another person to put on and taking off regular upper body clothing?: A Lot Help from another person to put on and taking off regular lower body clothing?: Total 6 Click Score: 12    End of Session    OT Visit Diagnosis: Unsteadiness on feet (R26.81);Other abnormalities of gait and mobility (R26.89);Muscle weakness (generalized) (M62.81);Other symptoms and signs involving cognitive function;Hemiplegia and hemiparesis Hemiplegia - Right/Left: Left Hemiplegia - dominant/non-dominant: Non-Dominant Hemiplegia - caused by: Cerebral infarction   Activity Tolerance Patient tolerated treatment well   Patient Left in bed;with call bell/phone within reach;with family/visitor present;with bed alarm set   Nurse Communication          Time: 3893-7342 OT Time Calculation (min): 29 min  Charges: OT General Charges $OT Visit: 1 Visit OT Treatments $Self Care/Home Management : 8-22 mins $Therapeutic Activity: 8-22 mins   Britt Bottom 03/12/2021, 1:07 PM

## 2021-03-12 NOTE — H&P (Signed)
Physical Medicine and Rehabilitation Admission H&P    Chief Complaint  Patient presents with   Cerebrovascular Accident  : HPI: [name]. Housey is a 87 year old right-handed male with history of Alzheimer's dementia maintained on Aricept, type 2 diabetes mellitus, hypertension, CKD stage III, macular degeneration, adenocarcinoma of left lung with resection 2001, prostate cancer with prostatectom 2003 y, CAD, quit smoking 36 years ago..  Per chart review patient lives with spouse as well as a live-in caregiver who assist with meal preparation and ADLs.  Patient using rolling walker prior to admission.  1 level home with ramped entrance.  He also has good outside extended family.  Presented 03/07/2021 with left-sided weakness x2 to 3 days.  Cranial CT scan negative.  MRI identified small foci of acute ischemia within the ventral right pons and left frontal periventricular white matter.  No hemorrhage or mass-effect.  MRA with no intracranial large vessel occlusion.  Patient did not receive tPA.  Admission chemistries unremarkable except potassium 5.2 glucose 229 creatinine 1.50, alcohol negative, hemoglobin A1c 8.3, urinalysis negative nitrite.  Echocardiogram with ejection fraction of 50 to 55% no wall motion abnormalities grade 1 diastolic dysfunction.  Neurology follow-up maintained on aspirin 81 mg daily and Plavix 75 mg daily for CVA prophylaxis x3 weeks then aspirin alone.  Lovenox for DVT prophylaxis.  Dysphagia #3 nectar thick liquid diet.  Therapy evaluations completed due to patient's left-sided weakness decreased functional mobility was admitted for a comprehensive rehab program.  Review of Systems  Constitutional:  Negative for chills and fever.  HENT:  Positive for hearing loss.   Eyes:  Negative for blurred vision and double vision.  Respiratory:  Negative for cough and shortness of breath.   Cardiovascular:  Negative for chest pain, palpitations and leg swelling.  Gastrointestinal:   Positive for constipation. Negative for heartburn, nausea and vomiting.  Genitourinary:  Positive for urgency. Negative for dysuria, flank pain and hematuria.  Musculoskeletal:  Positive for joint pain and myalgias.  Skin:  Negative for rash.  Psychiatric/Behavioral:  Positive for depression and memory loss.   All other systems reviewed and are negative. Past Medical History:  Diagnosis Date   Adenocarcinoma of left lung, stage 1 ([place]) 2001   T1N0 stage I  adenoca left lung resected 01/03/00    Alzheimer's disease ([place])    CAD (coronary artery disease) 2014   a. 09/2012 Cath: LM nl, LAD 50-60p, D1 60-70 m, D1 50ost, LCX nl, RCA min irregs, EF 55-65%.   Generalized anxiety disorder 01/08/2007   Qualifier: Diagnosis of  By: Diona Browner MD, [name]     History of colonic polyps 07/25/2011   Macular degeneration of both eyes 01/21/2014   Nonmelanoma skin cancer 07/25/2011   Multiple lesions excised face/nose   Pericarditis    a. 09/2012 with effusion and tamponade, s/p window.  b.  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 SCC IN SITU TX CX3   SCC (squamous cell carcinoma) 04/12/2005   RIGHT NECK SUP. SCC IN SITU TX EXC   SCC (squamous cell carcinoma) 04/16/2013   LEFT TEMPLE SCC IN SITU TX CX3 5FU   SCC (squamous cell carcinoma) 04/16/2013   BELOW LEFT EYE SCC IN SITU TX CX3 5FU   SCC  (squamous cell carcinoma) 04/16/2013   BELOW RIGHT EYE SCC IN SITU TX CX3 5FU   SCC (squamous cell carcinoma) 08/04/2014   LEFT CHEEK SCC IN SITU TX CX3 5FU   SCC (squamous cell carcinoma) 11/27/2018   LEFT TEMPLE SCC IN SITU TX WITH BX   SCC (squamous cell carcinoma)    SCC (squamous cell carcinoma)    SCC (squamous cell carcinoma)    SCC (squamous cell carcinoma) Well Diff 09/13/2016   Tip of Nose SCC WELL DIFF TX (MOH's), and RIGHT INNER EYE SUP. SCC IN SITU TX TO WATCH   SCC (squamous cell carcinoma) Well Diff 05/30/2017   Right Cheekbone (Cx3,5FU) and Under Left Eye (Cx3,5FU)   Squamous cell carcinoma in situ (SCCIS) 04/12/2005   Right Neck Inf (Cx3), Right Neck Sup (Exc), and Bulb of Nose (Cx3,Exc)   Squamous cell carcinoma in situ (SCCIS) 09/27/2005   Nose (Cx3,Exc)   Squamous cell carcinoma in situ (SCCIS) 02/18/2013   Below Left Eye (tx p bx)   Squamous cell carcinoma in situ (SCCIS) 04/16/2013   Left Temple (Cx3,5FU), Below Left Eye (Cx3,5FU), Below Right Eye (Cx3,5FU)   Squamous cell carcinoma in situ (SCCIS) 08/04/2014   Left Cheek (Cx3,5FU)   Squamous cell carcinoma in situ (SCCIS) 09/13/2016   Right Inner Eye Sup. 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   Type 2 diabetes mellitus with vascular disease ([place]) 05/21/2007   Qualifier: Diagnosis of  By: Diona Browner MD, [name]     Type 2 DM with CKD stage 3 and hypertension ([place]) 07/16/2015   Unspecified essential hypertension    Past Surgical History:  Procedure Laterality Date   HERNIA REPAIR     LEFT HEART CATHETERIZATION WITH CORONARY ANGIOGRAM N/A 09/13/2012   Procedure: LEFT HEART CATHETERIZATION WITH CORONARY ANGIOGRAM;  Surgeon:  Sherren Mocha, MD;  Location[facility] C[facility] LAB;  Service: Cardiovascular;  Laterality: N/A;   LOBECTOMY  2001   upper left   PERICARDIAL TAP N/A 09/16/2012   Procedure: PERICARDIAL TAP;  Surgeon: Sinclair Grooms, MD;  Location[facility] C[facility] LAB;  Service: Cardiovascular;  Laterality: N/A;   PROSTATECTOMY  2003   SUBXYPHOID PERICARDIAL WINDOW N/A 09/16/2012   Procedure: SUBXYPHOID PERICARDIAL WINDOW;  Surgeon: Rexene Alberts, MD;  Location: [facility] OR;  Service: Thoracic;  Laterality: N/A;   TONSILLECTOMY     Family History  Problem Relation Age of Onset   Cancer Father        colon   Dementia Mother    Diabetes Mother    Cancer Brother        lung   Social History:  reports that he quit  smoking about 36 years ago. His smoking use included cigarettes. He started smoking about 68 years ago. He has a 84.00 pack-year smoking history. He quit smokeless tobacco use about 34 years ago.  His smokeless tobacco use included chew. He reports that he does not drink alcohol and does not use drugs. Allergies:  Allergies  Allergen Reactions   Sulfa Antibiotics Nausea Only   Medications Prior to Admission  Medication Sig Dispense Refill   aspirin EC 81 MG tablet Take 81 mg by mouth daily as needed for moderate pain.     diphenhydrAMINE (BENADRYL) 25 MG tablet Take 25 mg by mouth daily.     donepezil (ARICEPT) 10 MG tablet TAKE 1 TABLET BY MOUTH EVERYDAY AT BEDTIME (Patient taking differently: Take 10 mg by mouth in the morning.) 90 tablet 0   glipiZIDE (GLUCOTROL) 5 MG tablet TAKE 1 TABLET BY MOUTH EVERY DAY BEFORE BREAKFAST 90 tablet 3   metFORMIN (GLUCOPHAGE-XR) 500 MG 24 hr tablet Take 500 mg by mouth in the morning and at bedtime.     Multiple Vitamins-Minerals (ICAPS LUTEIN & ZEAXANTHIN PO) Take 1 capsule by mouth in the morning and at bedtime. I - Caps     Omega-3 Fatty Acids (FISH OIL) 1000 MG CAPS Take 1,200 mg by mouth in the morning and at bedtime.     Propylene Glycol (SYSTANE BALANCE) 0.6 % SOLN  Apply 1 drop to eye 2 (two) times daily as needed (dry eyes).     sertraline (ZOLOFT) 100 MG tablet TAKE 1 TABLET BY MOUTH EVERY DAY 90 tablet 3    Drug Regimen Review Drug regimen was reviewed and remains appropriate with no significant issues identified  Home: Home Living Family/patient expects to be discharged to:: Private residence Living Arrangements: Spouse/significant other Available Help at Discharge: Family, Personal care attendant, Available 24 hours/day Type of Home: House Home Access: Ramped entrance Home Layout: One level Bathroom Shower/Tub: Multimedia programmer: Handicapped height Home Equipment: Conservation officer, nature (2 wheels), Hospital bed, Grab bars - tub/shower, Shower seat, Grab bars - toilet Additional Comments: Has a live in caregiver   Functional History: Prior Function Prior Level of Function : Independent/Modified Independent Mobility Comments: Recently started using RW ADLs Comments: Caregiver helps with meal prep, IADLs.  Functional Status:  Mobility: Bed Mobility Overal bed mobility: Needs Assistance Bed Mobility: Supine to Sit Rolling: Min assist Sidelying to sit: Mod assist, HOB elevated Supine to sit: Mod assist Sit to supine: Min assist General bed mobility comments: assist to guide LUE as rolling to right, LEs over EOB and to raise trunk, no assist to scoot hips out to EOB and get feet on the floor Transfers Overall transfer level: Needs assistance Equipment used: None Transfers: Sit to/from Stand Sit to Stand: Mod assist Bed to/from chair/wheelchair/BSC transfer type:: Step pivot Stand pivot transfers: Mod assist Step pivot transfers: Mod assist, +2 physical assistance, +2 safety/equipment General transfer comment: assist to rise and steady; no need to block L knee.Stood from EOB x 3 reps Ambulation/Gait Ambulation/Gait assistance: Mod assist, +2 physical assistance Gait Distance (Feet): 5 Feet Assistive device: 2 person hand held  assist Gait Pattern/deviations: Step-to pattern, Decreased step length - left, Decreased weight shift to left, Knee flexed in stance - left, Narrow base of support General Gait Details: pt able to advance LLE for small step forward, however requires assist to widen step (LLE tends to adduct); max cues for sequencing with pt getting impulsive and stepping prior to cues x 1 Pre-gait  activities: side step to left (toward [facility]) with mod assist for advancing LLE and maintaining balance. 3 steps with each foot    ADL: ADL Overall ADL's : Needs assistance/impaired Eating/Feeding: Minimal assistance, Sitting Grooming: Minimal assistance, Sitting Upper Body Bathing: Moderate assistance, Sitting Lower Body Bathing: +2 for physical assistance, Total assistance, Sit to/from stand Upper Body Dressing : Moderate assistance, Sitting Lower Body Dressing: +2 for physical assistance, Sit to/from stand, Bed level, Total assistance Toileting- Clothing Manipulation and Hygiene: Total assistance, +2 for physical assistance, Bed level Toileting - Clothing Manipulation Details (indicate cue type and reason): changed soiled brief  Cognition: Cognition Overall Cognitive Status: History of cognitive impairments - at baseline Orientation Level: Oriented X4 Cognition Arousal/Alertness: Awake/alert Behavior During Therapy: WFL for tasks assessed/performed Overall Cognitive Status: History of cognitive impairments - at baseline General Comments: Dementia at baseline  Physical Exam: Blood pressure (!) 144/66, pulse 73, temperature (!) 97.4 F (36.3 C), temperature source Oral, resp. rate 20, height 5\' 9"  (1.753 m), weight 85.1 kg, SpO2 95 %. Physical Exam Constitutional:      General: He is not in acute distress. HENT:     Head: Normocephalic and atraumatic.     Right Ear: External ear normal.     Left Ear: External ear normal.     Nose: Nose normal.     Mouth/Throat:     Mouth: Mucous membranes are moist.      Pharynx: Oropharynx is clear.  Eyes:     Extraocular Movements: Extraocular movements intact.     Pupils: Pupils are equal, round, and reactive to light.  Cardiovascular:     Rate and Rhythm: Normal rate and regular rhythm.     Heart sounds: No murmur heard.   No gallop.  Pulmonary:     Effort: Pulmonary effort is normal. No respiratory distress.     Breath sounds: No wheezing or rales.  Abdominal:     General: There is no distension.     Palpations: Abdomen is soft.     Tenderness: There is no abdominal tenderness.  Musculoskeletal:     Cervical back: Normal range of motion and neck supple.     Comments: Full ROM, No pain with AROM or PROM in the neck, trunk, or extremities. Posture appropriate   Skin:    General: Skin is warm and dry.  Neurological:     Mental Status: He is alert.     Comments: Patient is alert.  Makes eye contact with examiner.  He was able to provide his name place, month, year, why he's here.  Follows basic commands. Left central 7 and tongue deviation. LUE 2/5 deltoid, 3/5 bicep, 2+ tricep, 1+ to 2- wrist/fingers. LLE 2+ to 3/5. RUE and RLE 4 to 4+/5 prox to distal. Sensory exam normal for light touch and pain in all 4 limbs. No limb ataxia or cerebellar signs. No abnormal tone appreciated.  No abnormal tone  Psychiatric:        Mood and Affect: Mood normal.        Behavior: Behavior normal.    Results for orders placed or performed during the hospital encounter of 03/07/21 (from the past 48 hour(s))  Glucose, capillary     Status: Abnormal   Collection Time: 03/10/21  7:24 AM  Result Value Ref Range   Glucose-Capillary 211 (H) 70 - 99 mg/dL    Comment: Glucose reference range applies only to samples taken after fasting for at least 8 hours.  Glucose, capillary  Status: Abnormal   Collection Time: 03/10/21 11:58 AM  Result Value Ref Range   Glucose-Capillary 240 (H) 70 - 99 mg/dL    Comment: Glucose reference range applies only to samples taken after  fasting for at least 8 hours.  Glucose, capillary     Status: Abnormal   Collection Time: 03/10/21  5:16 PM  Result Value Ref Range   Glucose-Capillary 172 (H) 70 - 99 mg/dL    Comment: Glucose reference range applies only to samples taken after fasting for at least 8 hours.  Glucose, capillary     Status: Abnormal   Collection Time: 03/10/21  8:23 PM  Result Value Ref Range   Glucose-Capillary 224 (H) 70 - 99 mg/dL    Comment: Glucose reference range applies only to samples taken after fasting for at least 8 hours.  Basic metabolic panel     Status: Abnormal   Collection Time: 03/11/21  2:42 AM  Result Value Ref Range   Sodium 135 135 - 145 mmol/L   Potassium 4.4 3.5 - 5.1 mmol/L   Chloride 105 98 - 111 mmol/L   CO2 25 22 - 32 mmol/L   Glucose, Bld 170 (H) 70 - 99 mg/dL    Comment: Glucose reference range applies only to samples taken after fasting for at least 8 hours.   BUN 22 8 - 23 mg/dL   Creatinine, Ser 1.41 (H) 0.61 - 1.24 mg/dL   Calcium 8.7 (L) 8.9 - 10.3 mg/dL   GFR, Estimated 48 (L) >60 mL/min    Comment: (NOTE) Calculated using the CKD-EPI Creatinine Equation (2021)    Anion gap 5 5 - 15    Comment: Performed at [facility] [address], [place], [place] [address]  Magnesium     Status: None   Collection Time: 03/11/21  2:42 AM  Result Value Ref Range   Magnesium 2.0 1.7 - 2.4 mg/dL    Comment: Performed at [facility] [address] [place], [place] [address]  Glucose, capillary     Status: Abnormal   Collection Time: 03/11/21  8:09 AM  Result Value Ref Range   Glucose-Capillary 256 (H) 70 - 99 mg/dL    Comment: Glucose reference range applies only to samples taken after fasting for at least 8 hours.  Glucose, capillary     Status: Abnormal   Collection Time: 03/11/21 12:47 PM  Result Value Ref Range   Glucose-Capillary 190 (H) 70 - 99 mg/dL    Comment: Glucose reference range applies only to samples taken after fasting for at least 8 hours.   Glucose, capillary     Status: Abnormal   Collection Time: 03/11/21  4:58 PM  Result Value Ref Range   Glucose-Capillary 215 (H) 70 - 99 mg/dL    Comment: Glucose reference range applies only to samples taken after fasting for at least 8 hours.  Glucose, capillary     Status: Abnormal   Collection Time: 03/11/21  8:32 PM  Result Value Ref Range   Glucose-Capillary 201 (H) 70 - 99 mg/dL    Comment: Glucose reference range applies only to samples taken after fasting for at least 8 hours.   No results found.     Medical Problem List and Plan: 1.  Left-sided weakness functional deficits secondary to right ventral pontine and small left periventricular lacunar infarcts  -patient may shower  -ELOS/Goals: 10-12 days, supervision to min PT, OT and sup/mod I SLP 2.  Antithrombotics: -DVT/anticoagulation:  Pharmaceutical:  Lovenox  -antiplatelet therapy: Aspirin 81 mg daily and Plavix 75 mg day x3 weeks then aspirin alone.  Plavix can be discontinued after last dose 03/29/2021 3. Pain Management: Tylenol as needed 4. Mood/dementia: Aricept 10 mg nightly, Zoloft 100 mg daily  -antipsychotic agents: N/A 5. Neuropsych: This patient is capable of making decisions on his own behalf. 6. Skin/Wound Care: Routine skin checks 7. Fluids/Electrolytes/Nutrition: Routine in and outs with follow-up chemistries 8.  Diabetes mellitus.  Hemoglobin A1c 8.3.  Semglee just adjusted to 10 units daily.  Check blood sugars before meals and at bedtime  -cbg's poorly controlled at present. Observe with recent semglee change 9.  Dysphagia.  Dysphagia #3 nectar thick liquid diet.  Advance per speech therapy 10.  CKD stage IIIa.  Baseline creatinine 1.3-1.8. 11.  Macrocytic anemia/vitamin B12 deficiency.   -b12 injections x 5 days (thru 12/11)-->oral supplementation -Continue iron supplement for mild iron def anemia 12.  History of prostate cancer with prostatectomy.  Follow-up outpatient. 13.  History of  adenocarcinoma left lung with resection 2001.  Follow-up outpatient 14.  Hyperlipidemia.  Lipitor 15.  Constipation.  Senokot S2 tabs twice daily, MiraLAX daily.  Lavon Paganini Angiulli, PA-C 03/12/2021

## 2021-03-12 NOTE — Progress Notes (Signed)
Inpatient Rehabilitation Admission Medication Review by a Pharmacist  A complete drug regimen review was completed for this patient to identify any potential clinically significant medication issues.  High Risk Drug Classes Is patient taking? Indication by Medication  Antipsychotic No   Anticoagulant Yes Lovenox for DVT px  Antibiotic No   Opioid No   Antiplatelet Yes ASA/plavix - CVA  Hypoglycemics/insulin Yes Semglee - DM  Vasoactive Medication No   Chemotherapy No   Other Yes Zoloft - depression B12/ferrous sulfate - anemia Protonix - GERD Aricept - dementia Atorvastatin - HLD/CVA     Type of Medication Issue Identified Description of Issue Recommendation(s)  Drug Interaction(s) (clinically significant)     Duplicate Therapy     Allergy     No Medication Administration End Date     Incorrect Dose     Additional Drug Therapy Needed     Significant med changes from prior encounter (inform family/care partners about these prior to discharge).    Other       Clinically significant medication issues were identified that warrant physician communication and completion of prescribed/recommended actions by midnight of the next day:  No  Name of provider notified for urgent issues identified:   Provider Method of Notification:     Pharmacist comments:   Time spent performing this drug regimen review (minutes):  [address] PharmD, [place], AAHIVP, CPP Infectious Disease Pharmacist 03/12/2021 4:22 PM

## 2021-03-13 DIAGNOSIS — I6350 Cerebral infarction due to unspecified occlusion or stenosis of unspecified cerebral artery: Secondary | ICD-10-CM | POA: Diagnosis not present

## 2021-03-13 LAB — GLUCOSE, CAPILLARY
Glucose-Capillary: 172 mg/dL — ABNORMAL HIGH (ref 70–99)
Glucose-Capillary: 194 mg/dL — ABNORMAL HIGH (ref 70–99)
Glucose-Capillary: 198 mg/dL — ABNORMAL HIGH (ref 70–99)
Glucose-Capillary: 243 mg/dL — ABNORMAL HIGH (ref 70–99)

## 2021-03-13 NOTE — Plan of Care (Signed)
  Problem: RH Balance Goal: LTG Patient will maintain dynamic sitting balance (PT) Description: LTG:  Patient will maintain dynamic sitting balance with assistance during mobility activities (PT) Flowsheets (Taken 03/13/2021 1943) LTG: Pt will maintain dynamic sitting balance during mobility activities with:: Supervision/Verbal cueing Goal: LTG Patient will maintain dynamic standing balance (PT) Description: LTG:  Patient will maintain dynamic standing balance with assistance during mobility activities (PT) Flowsheets (Taken 03/13/2021 1943) LTG: Pt will maintain dynamic standing balance during mobility activities with:: Contact Guard/Touching assist   Problem: Sit to Stand Goal: LTG:  Patient will perform sit to stand with assistance level (PT) Description: LTG:  Patient will perform sit to stand with assistance level (PT) Flowsheets (Taken 03/13/2021 1943) LTG: PT will perform sit to stand in preparation for functional mobility with assistance level: Supervision/Verbal cueing   Problem: RH Bed Mobility Goal: LTG Patient will perform bed mobility with assist (PT) Description: LTG: Patient will perform bed mobility with assistance, with/without cues (PT). Flowsheets (Taken 03/13/2021 1943) LTG: Pt will perform bed mobility with assistance level of: Supervision/Verbal cueing   Problem: RH Bed to Chair Transfers Goal: LTG Patient will perform bed/chair transfers w/assist (PT) Description: LTG: Patient will perform bed to chair transfers with assistance (PT). Flowsheets (Taken 03/13/2021 1943) LTG: Pt will perform Bed to Chair Transfers with assistance level: Contact Guard/Touching assist   Problem: RH Car Transfers Goal: LTG Patient will perform car transfers with assist (PT) Description: LTG: Patient will perform car transfers with assistance (PT). Flowsheets (Taken 03/13/2021 1943) LTG: Pt will perform car transfers with assist:: Contact Guard/Touching assist   Problem: RH  Ambulation Goal: LTG Patient will ambulate in controlled environment (PT) Description: LTG: Patient will ambulate in a controlled environment, # of feet with assistance (PT). Flowsheets (Taken 03/13/2021 1943) LTG: Pt will ambulate in controlled environ  assist needed:: Contact Guard/Touching assist LTG: Ambulation distance in controlled environment: 153ft using LRAD Goal: LTG Patient will ambulate in home environment (PT) Description: LTG: Patient will ambulate in home environment, # of feet with assistance (PT). Flowsheets (Taken 03/13/2021 1943) LTG: Pt will ambulate in home environ  assist needed:: Contact Guard/Touching assist LTG: Ambulation distance in home environment: 51ft using LRAD   Problem: RH Stairs Goal: LTG Patient will ambulate up and down stairs w/assist (PT) Description: LTG: Patient will ambulate up and down # of stairs with assistance (PT) Flowsheets (Taken 03/13/2021 1943) LTG: Pt will ambulate up/down stairs assist needed:: Contact Guard/Touching assist LTG: Pt will  ambulate up and down number of stairs: 4 steps using B HRs

## 2021-03-13 NOTE — Progress Notes (Signed)
PROGRESS NOTE   Subjective/Complaints:  Asking for ice chip protocol- Denies pain including no HA.  LBM 12/9 per nursing- pt said 2 days ago- feels like needs to go again.  Has hx of lobectomy and reason that wheezes some- is chronic/normal for him.   ROS:  Pt denies SOB, abd pain, CP, N/V/C/D, and vision changes   Objective:   No results found. No results for input(s): WBC, HGB, HCT, PLT in the last 72 hours. Recent Labs    03/11/21 0242  NA 135  K 4.4  CL 105  CO2 25  GLUCOSE 170*  BUN 22  CREATININE 1.41*  CALCIUM 8.7*    Intake/Output Summary (Last 24 hours) at 03/13/2021 1015 Last data filed at 03/13/2021 0544 Gross per 24 hour  Intake 300 ml  Output 500 ml  Net -200 ml        Physical Exam: Vital Signs Blood pressure 125/63, pulse 67, temperature 97.9 F (36.6 C), resp. rate 16, height 5\' 9"  (1.753 m), weight 85.7 kg, SpO2 96 %.    General: awake, alert, appropriate, sitting up in bed; daughter at bedside; NAD HENT: conjugate gaze; oropharynx moist CV: regular rate; no JVD Pulmonary: upper airway sounds- rare wheezing heard- decreased at bases GI: soft, NT, ND, (+)BS Psychiatric: appropriate- interactive Neurological: alert Musculoskeletal:     Cervical back: Normal range of motion and neck supple.     Comments: Full ROM, No pain with AROM or PROM in the neck, trunk, or extremities. Posture appropriate   Skin:    General: Skin is warm and dry.  Neurological:     Mental Status: He is alert.     Comments: Patient is alert.  Makes eye contact with examiner.  He was able to provide his name place, month, year, why he's here.  Follows basic commands. Left central 7 and tongue deviation. LUE 2/5 deltoid, 3/5 bicep, 2+ tricep, 1+ to 2- wrist/fingers. LLE 2+ to 3/5. RUE and RLE 4 to 4+/5 prox to distal. Sensory exam normal for light touch and pain in all 4 limbs. No limb ataxia or cerebellar signs.  No abnormal tone appreciated.  No abnormal tone   Assessment/Plan: 1. Functional deficits which require 3+ hours per day of interdisciplinary therapy in a comprehensive inpatient rehab setting. Physiatrist is providing close team supervision and 24 hour management of active medical problems listed below. Physiatrist and rehab team continue to assess barriers to discharge/monitor patient progress toward functional and medical goals  Care Tool:  Bathing    Body parts bathed by patient: Left lower leg, Face, Left arm, Chest, Abdomen, Right upper leg, Buttocks, Front perineal area, Left upper leg, Right lower leg   Body parts bathed by helper: Buttocks, Right arm     Bathing assist Assist Level: Moderate Assistance - Patient 50 - 74%     Upper Body Dressing/Undressing Upper body dressing   What is the patient wearing?: Button up shirt, Hospital gown only    Upper body assist Assist Level: Moderate Assistance - Patient 50 - 74%    Lower Body Dressing/Undressing Lower body dressing      What is the patient wearing?: Incontinence brief, Pants  Lower body assist Assist for lower body dressing: Maximal Assistance - Patient 25 - 49%     Toileting Toileting    Toileting assist Assist for toileting: Dependent - Patient 0% (in stedy)     Transfers Chair/bed transfer  Transfers assist     Chair/bed transfer assist level: Maximal Assistance - Patient 25 - 49% (squat-pivot to his R)     Locomotion Ambulation   Ambulation assist              Walk 10 feet activity   Assist           Walk 50 feet activity   Assist           Walk 150 feet activity   Assist           Walk 10 feet on uneven surface  activity   Assist           Wheelchair     Assist               Wheelchair 50 feet with 2 turns activity    Assist            Wheelchair 150 feet activity     Assist          Blood pressure 125/63, pulse 67,  temperature 97.9 F (36.6 C), resp. rate 16, height 5\' 9"  (1.753 m), weight 85.7 kg, SpO2 96 %.  Medical Problem List and Plan: 1.  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  Antithrombotics: -DVT/anticoagulation:  Pharmaceutical: Lovenox             -antiplatelet therapy: Aspirin 81 mg daily and Plavix 75 mg day x3 weeks then aspirin alone.  Plavix can be discontinued after last dose 03/29/2021 3. Pain Management: Tylenol as needed 4. Mood/dementia: Aricept 10 mg nightly, Zoloft 100 mg daily             -antipsychotic agents: N/A 5. Neuropsych: This patient is capable of making decisions on his own behalf. 6. Skin/Wound Care: Routine skin checks 7. Fluids/Electrolytes/Nutrition: Routine in and outs with follow-up chemistries 8.  Diabetes mellitus.  Hemoglobin A1c 8.3.  Semglee just adjusted to 10 units daily.  Check blood sugars before meals and at bedtime             -cbg's poorly controlled at present.  12/10- CBGs 172-301 in last 24 hours- just had Insulin increased- will monitor for trend and change as required.  Observe with recent semglee change 9.  Dysphagia.  Dysphagia #3 nectar thick liquid diet.  Advance per speech therapy  12/10- make sure pt can get ice chips per prior protocol.  10.  CKD stage IIIa.  Baseline creatinine 1.3-1.8.  12/10- last Cr 1.41 down from 1.60 2 days prior- will monitor 11.  Macrocytic anemia/vitamin B12 deficiency.   -b12 injections x 5 days (thru 12/11)-->oral supplementation -Continue iron supplement for mild iron def anemia 12.  History of prostate cancer with prostatectomy.  Follow-up outpatient. 20.  History of adenocarcinoma left lung with resection 2001.  Follow-up outpatient  12/10- pt usually  wheezes per daughter- is chronic for him- s/p lobectomy.  14.  Hyperlipidemia.  Lipitor 15.  Constipation.  Senokot S2 tabs twice daily, MiraLAX daily.  12/10- last BM was documented 12/9- pt says 12/8- will con't regimen     LOS: 1 days A FACE TO FACE EVALUATION WAS PERFORMED  Lynessa Almanzar 03/13/2021, 10:15 AM

## 2021-03-13 NOTE — Plan of Care (Signed)
  Problem: RH Balance Goal: LTG Patient will maintain dynamic standing with ADLs (OT) Description: LTG:  Patient will maintain dynamic standing balance with assist during activities of daily living (OT)  Flowsheets (Taken 03/13/2021 1240) LTG: Pt will maintain dynamic standing balance during ADLs with: Contact Guard/Touching assist   Problem: Sit to Stand Goal: LTG:  Patient will perform sit to stand in prep for activites of daily living with assistance level (OT) Description: LTG:  Patient will perform sit to stand in prep for activites of daily living with assistance level (OT) Flowsheets (Taken 03/13/2021 1240) LTG: PT will perform sit to stand in prep for activites of daily living with assistance level: Supervision/Verbal cueing   Problem: RH Eating Goal: LTG Patient will perform eating w/assist, cues/equip (OT) Description: LTG: Patient will perform eating with assist, with/without cues using equipment (OT) Flowsheets (Taken 03/13/2021 1240) LTG: Pt will perform eating with assistance level of: Independent with assistive device    Problem: RH Grooming Goal: LTG Patient will perform grooming w/assist,cues/equip (OT) Description: LTG: Patient will perform grooming with assist, with/without cues using equipment (OT) Flowsheets (Taken 03/13/2021 1240) LTG: Pt will perform grooming with assistance level of: Supervision/Verbal cueing   Problem: RH Bathing Goal: LTG Patient will bathe all body parts with assist levels (OT) Description: LTG: Patient will bathe all body parts with assist levels (OT) Flowsheets (Taken 03/13/2021 1240) LTG: Pt will perform bathing with assistance level/cueing: Contact Guard/Touching assist   Problem: RH Dressing Goal: LTG Patient will perform upper body dressing (OT) Description: LTG Patient will perform upper body dressing with assist, with/without cues (OT). Flowsheets (Taken 03/13/2021 1240) LTG: Pt will perform upper body dressing with assistance  level of: Supervision/Verbal cueing Goal: LTG Patient will perform lower body dressing w/assist (OT) Description: LTG: Patient will perform lower body dressing with assist, with/without cues in positioning using equipment (OT) Flowsheets (Taken 03/13/2021 1240) LTG: Pt will perform lower body dressing with assistance level of: Supervision/Verbal cueing   Problem: RH Toileting Goal: LTG Patient will perform toileting task (3/3 steps) with assistance level (OT) Description: LTG: Patient will perform toileting task (3/3 steps) with assistance level (OT)  Flowsheets (Taken 03/13/2021 1240) LTG: Pt will perform toileting task (3/3 steps) with assistance level: Supervision/Verbal cueing   Problem: RH Functional Use of Upper Extremity Goal: LTG Patient will use RT/LT upper extremity as a (OT) Description: LTG: Patient will use right/left upper extremity as a stabilizer/gross assist/diminished/nondominant/dominant level with assist, with/without cues during functional activity (OT) Flowsheets (Taken 03/13/2021 1240) LTG: Use of upper extremity in functional activities: LUE as a stabilizer LTG: Pt will use upper extremity in functional activity with assistance level of: Supervision/Verbal cueing   Problem: RH Toilet Transfers Goal: LTG Patient will perform toilet transfers w/assist (OT) Description: LTG: Patient will perform toilet transfers with assist, with/without cues using equipment (OT) Flowsheets (Taken 03/13/2021 1240) LTG: Pt will perform toilet transfers with assistance level of: Contact Guard/Touching assist   Problem: RH Tub/Shower Transfers Goal: LTG Patient will perform tub/shower transfers w/assist (OT) Description: LTG: Patient will perform tub/shower transfers with assist, with/without cues using equipment (OT) Flowsheets (Taken 03/13/2021 1240) LTG: Pt will perform tub/shower stall transfers with assistance level of: Contact Guard/Touching assist

## 2021-03-13 NOTE — Evaluation (Addendum)
Speech Language Pathology Assessment and Plan  Patient Details  Name: [name] MRN: [id number] Date of Birth: [birth year]  SLP Diagnosis: Dysarthria;Dysphagia  Rehab Potential: Good ELOS: 3-3.5 weeks    Today's Date: 03/13/2021 SLP Individual Time: 1300-1400 SLP Individual Time Calculation (min): 47 min   Hospital Problem: Principal Problem:   Right pontine cerebrovascular accident [facility])  Past Medical History:  Past Medical History:  Diagnosis Date   Adenocarcinoma of left lung, stage 1 ([place]) 2001   T1N0 stage I  adenoca left lung resected 01/03/00    Alzheimer's disease ([place])    CAD (coronary artery disease) 2014   a. 09/2012 Cath: LM nl, LAD 50-60p, D1 60-70 m, D1 50ost, LCX nl, RCA min irregs, EF 55-65%.   Generalized anxiety disorder 01/08/2007   Qualifier: Diagnosis of  By: Diona Browner MD, [name]     History of colonic polyps 07/25/2011   Macular degeneration of both eyes 01/21/2014   Nonmelanoma skin cancer 07/25/2011   Multiple lesions excised face/nose   Pericarditis    a. 09/2012 with effusion and tamponade, s/p window.  b.  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 SCC IN SITU TX CX3   SCC (squamous cell carcinoma) 04/12/2005   RIGHT NECK SUP. SCC IN SITU TX EXC   SCC (squamous cell carcinoma) 04/16/2013   LEFT TEMPLE SCC IN SITU TX CX3 5FU   SCC  (squamous cell carcinoma) 04/16/2013   BELOW LEFT EYE SCC IN SITU TX CX3 5FU   SCC (squamous cell carcinoma) 04/16/2013   BELOW RIGHT EYE SCC IN SITU TX CX3 5FU   SCC (squamous cell carcinoma) 08/04/2014   LEFT CHEEK SCC IN SITU TX CX3 5FU   SCC (squamous cell carcinoma) 11/27/2018   LEFT TEMPLE SCC IN SITU TX WITH BX   SCC (squamous cell carcinoma)    SCC (squamous cell carcinoma)    SCC (squamous cell carcinoma)    SCC (squamous cell carcinoma) Well Diff 09/13/2016   Tip of Nose SCC WELL DIFF TX (MOH's), and RIGHT INNER EYE SUP. SCC IN SITU TX TO WATCH   SCC (squamous cell carcinoma) Well Diff 05/30/2017   Right Cheekbone (Cx3,5FU) and Under Left Eye (Cx3,5FU)   Squamous cell carcinoma in situ (SCCIS) 04/12/2005   Right Neck Inf (Cx3), Right Neck Sup (Exc), and Bulb of Nose (Cx3,Exc)   Squamous cell carcinoma in situ (SCCIS) 09/27/2005   Nose (Cx3,Exc)   Squamous cell carcinoma in situ (SCCIS) 02/18/2013   Below Left Eye (tx p bx)   Squamous cell carcinoma in situ (SCCIS) 04/16/2013   Left Temple (Cx3,5FU), Below Left Eye (Cx3,5FU), Below Right Eye (Cx3,5FU)   Squamous cell carcinoma in situ (SCCIS) 08/04/2014   Left Cheek (Cx3,5FU)  Squamous cell carcinoma in situ (SCCIS) 09/13/2016   Right Inner Eye Sup. 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   Type 2 diabetes mellitus with vascular disease ([place]) 05/21/2007   Qualifier: Diagnosis of  By: Diona Browner MD, [name]     Type 2 DM with CKD stage 3 and hypertension ([place]) 07/16/2015   Unspecified essential hypertension    Past Surgical History:  Past Surgical History:  Procedure Laterality Date   HERNIA REPAIR     LEFT HEART CATHETERIZATION WITH  CORONARY ANGIOGRAM N/A 09/13/2012   Procedure: LEFT HEART CATHETERIZATION WITH CORONARY ANGIOGRAM;  Surgeon: Sherren Mocha, MD;  [facility] C[facility] LAB;  Service: Cardiovascular;  Laterality: N/A;   LOBECTOMY  2001   upper left   PERICARDIAL TAP N/A 09/16/2012   Procedure: PERICARDIAL TAP;  Surgeon: Sinclair Grooms, MD;  Location: [facility] CATH LAB;  Service: Cardiovascular;  Laterality: N/A;   PROSTATECTOMY  2003   SUBXYPHOID PERICARDIAL WINDOW N/A 09/16/2012   Procedure: SUBXYPHOID PERICARDIAL WINDOW;  Surgeon: Rexene Alberts, MD;  Location: [place];  Service: Thoracic;  Laterality: N/A;   TONSILLECTOMY      Assessment / Plan / Recommendation Clinical Impression  [name] is a 87 year old right-handed male with history of Alzheimer's dementia maintained on Aricept, type 2 diabetes mellitus, hypertension, CKD stage III, macular degeneration, adenocarcinoma of left lung with resection 2001, prostate cancer with prostatectom 2003 y, CAD, quit smoking 36 years ago..  Per chart review patient lives with spouse as well as a live-in caregiver who assist with meal preparation and ADLs.  Patient using rolling walker prior to admission.  1 level home with ramped entrance.  He also has good outside extended family.  Presented 03/07/2021 with left-sided weakness x2 to 3 days.  Cranial CT scan negative.  MRI identified small foci of acute ischemia within the ventral right pons and left frontal periventricular white matter.  No hemorrhage or mass-effect.  MRA with no intracranial large vessel occlusion.  Patient did not receive tPA.  Admission chemistries unremarkable except potassium 5.2 glucose 229 creatinine 1.50, alcohol negative, hemoglobin A1c 8.3, urinalysis negative nitrite.  Echocardiogram with ejection fraction of 50 to 55% no wall motion abnormalities grade 1 diastolic dysfunction.  Neurology follow-up maintained on aspirin 81 mg daily and Plavix 75 mg daily for CVA prophylaxis x3 weeks then aspirin  alone.  Lovenox for DVT prophylaxis.  Dysphagia #3 nectar thick liquid diet.  Therapy evaluations completed due to patient's left-sided weakness decreased functional mobility was admitted for a comprehensive rehab program.  SLP evaluation completed on 03/13/2021 with results as follows:  Pt presents with delayed coughing following cup sips of thin liquids.  No overt s/s of aspiration were noted with nectar thick liquids, ice chips, or solid textures.  Pt reports that nectar thick liquids "go down smoother" than thin liquids.  Recommend that pt remain on dys 3 textures and nectar thick liquids with ice chips following oral care in between meals.  Given silent aspiration on previous MBS, pt may need repeat instrumental assessment prior to diet advancement.   Pt also presents with a mild dysarthria due to left sided weakness impacting articulatory  precision of consonants.  He was intelligible at the conversational level in a quiet environment and he denies difficulty being understood by staff or family members.  SLP provided skilled education regarding compensatory intelligibility strategies given pt's reports of increased effort needed for communication and would benefit from 1-2 additional sessions to reinforce strategies.  Pt, pt's wife, and pt's daughter all confirm that pt is at baseline for cognitive functioning so formal cognitive assessment was deferred at this time.  Given the abovementioned deficits, pt would benefit from skilled ST while inpatient in order to maximize functional independence and reduce burden of care prior to discharge.  Anticipate that pt may need ST follow up at next level of care depending on progress made while inpatient.    Skilled Therapeutic Interventions          Cognitive-linguistic evaluation completed with results and recommendations reviewed with family.    SLP Assessment  Patient will need skilled Speech Lanaguage Pathology Services during CIR admission     Recommendations  SLP Diet Recommendations: Dysphagia 3 (Mech soft);Ice chips PRN after oral care;Nectar Liquid Administration via: Cup Medication Administration: Whole meds with liquid Supervision: Patient able to self feed;Intermittent supervision to cue for compensatory strategies Compensations: Minimize environmental distractions;Slow rate;Small sips/bites Postural Changes and/or Swallow Maneuvers: Seated upright 90 degrees Oral Care Recommendations: Oral care BID;Staff/trained caregiver to provide oral care Patient destination: Home Follow up Recommendations: Other (comment) (to be determined) Equipment Recommended: To be determined    SLP Frequency 1 to 3 out of 7 days   SLP Duration  SLP Intensity  SLP Treatment/Interventions 3-3.5 weeks  Minumum of 1-2 x/day, 30 to 90 minutes  Cueing hierarchy;Dysphagia/aspiration precaution training;Internal/external aids;Speech/Language facilitation;Patient/family education;Functional tasks    Pain Pain Assessment Pain Scale: 0-10 Pain Score: 0-No pain  Prior Functioning Cognitive/Linguistic Baseline: Baseline deficits Baseline deficit details: hx of Alzheimer's dementia, did not drive but high functioning, helped around the house Type of Home: House  Lives With: Spouse;Other (Comment) (live in caregiver) Available Help at Discharge: Family;Personal care attendant;Available 24 hours/day;Available PRN/intermittently;Other (Comment) Vocation: Retired  Programmer, systems Overall Cognitive Status: History of cognitive impairments - at baseline Arousal/Alertness: Awake/alert Year: 2022 Month: December Day of Week: Correct Sustained Attention: Appears intact Memory:  (pt's daughter reports pt does not have memory impairments at baseline) Immediate Memory Recall: Sock;Blue;Bed Memory Recall Sock: Without Cue Memory Recall Blue: Without Cue Memory Recall Bed: Without Cue Problem Solving: Appears intact Safety/Judgment:  Impaired Comments: Daughter, wife, and pt all confirm that pt is at baseline level of cognitive functioning  Comprehension Auditory Comprehension Overall Auditory Comprehension: Appears within functional limits for tasks assessed Expression Expression Primary Mode of Expression: Verbal Verbal Expression Overall Verbal Expression: Appears within functional limits for tasks assessed Written Expression Dominant Hand: Right Oral Motor Oral Motor/Sensory Function Overall Oral Motor/Sensory Function: Moderate impairment Facial ROM: Reduced left;Suspected CN VII (facial) dysfunction Facial Symmetry: Abnormal symmetry left;Suspected CN VII (facial) dysfunction Facial Strength: Reduced left;Suspected CN VII (facial) dysfunction Lingual ROM: Within Functional Limits Lingual Symmetry: Abnormal symmetry left;Suspected CN XII (hypoglossal) dysfunction Motor Speech Overall Motor Speech: Impaired Respiration: Within functional limits Phonation: Normal Articulation: Impaired Level of Impairment: Conversation Intelligibility: Intelligible  Care Tool Care Tool Cognition Ability to hear (with hearing aid or hearing appliances if normally used Ability to hear (with hearing aid or hearing appliances if normally used): 2. Moderate difficulty - speaker has to increase volume and speak distinctly   Expression of Ideas and Wants Expression of Ideas and Wants:  4. Without difficulty (complex and basic) - expresses complex messages without difficulty and with speech that is clear and easy to understand   Understanding Verbal and Non-Verbal Content Understanding Verbal and Non-Verbal Content: 3. Usually understands - understands most conversations, but misses some part/intent of message. Requires cues at times to understand  Memory/Recall Ability Memory/Recall Ability : That he or she is in a hospital/hospital unit     Bedside Swallowing Assessment General Date of Onset: 03/07/21 Previous Swallow  Assessment: MBS 03/08/21 Diet Prior to this Study: Dysphagia 3 (soft);Nectar-thick liquids Temperature Spikes Noted: No Respiratory Status: Room air History of Recent Intubation: No Behavior/Cognition: Alert;Cooperative;Pleasant mood Oral Cavity - Dentition: Dentures, bottom;Dentures, top Self-Feeding Abilities: Able to feed self Vision: Functional for self-feeding Patient Positioning: Upright in bed Baseline Vocal Quality: Normal  Oral Care Assessment Does patient have any of the following "high(er) risk" factors?: None of the above Does patient have any of the following "at risk" factors?: Diet - patient on thickened liquids Patient is AT RISK: Order set for Adult Oral Care Protocol initiated -  "At Risk Patients" option selected (see row information) Patient is LOW RISK: Follow universal precautions (see row information) Ice Chips Ice chips: Within functional limits Thin Liquid Thin Liquid: Impaired Pharyngeal  Phase Impairments: Cough - Delayed Nectar Thick Nectar Thick Liquid: Within functional limits Honey Thick   Puree   Solid Solid: Impaired Oral Phase Impairments: Reduced lingual movement/coordination Oral Phase Functional Implications: Prolonged oral transit BSE Assessment Risk for Aspiration Impact on safety and function: Mild aspiration risk;Moderate aspiration risk Other Related Risk Factors: Deconditioning  Short Term Goals: Week 1: SLP Short Term Goal 1 (Week 1): Pt will consume therapeutic trials of thin liquids with minimal overt s/s of aspiration and mod I use of swallowing precautions over 2 consecutive sessions prior to repeat instrumental assessment. SLP Short Term Goal 2 (Week 1): Pt will utilize increased vocal intensity and overarticulation to achieve intelligibility at the conversational level with mod I  Refer to Care Plan for Long Term Goals  Recommendations for other services: None   Discharge Criteria: Patient will be discharged from SLP if  patient refuses treatment 3 consecutive times without medical reason, if treatment goals not met, if there is a change in medical status, if patient makes no progress towards goals or if patient is discharged from hospital.  The above assessment, treatment plan, treatment alternatives and goals were discussed and mutually agreed upon: by patient and by family  Nykia Turko, Selinda Orion 03/13/2021, 4:20 PM

## 2021-03-13 NOTE — Discharge Instructions (Addendum)
Inpatient Rehab Discharge Instructions  [name] Discharge date and time: No discharge date for patient encounter.   Activities/Precautions/ Functional Status: Activity: As tolerated Diet: Diabetic diet Wound Care: Routine skin checks Functional status:  ___ No restrictions     ___ Walk up steps independently ___ 24/7 supervision/assistance   ___ Walk up steps with assistance ___ Intermittent supervision/assistance  ___ Bathe/dress independently ___ Walk with walker     _x__ Bathe/dress with assistance ___ Walk Independently    ___ Shower independently ___ Walk with assistance    ___ Shower with assistance ___ No alcohol     ___ Return to work/school ________  COMMUNITY REFERRALS UPON DISCHARGE:    Outpatient: PT     OT    ST                Agency: [facility] POEUM:[phone]              Appointment Date/Time: TBD  Medical Equipment/Items Ordered: Conservation officer, nature and Wheelchair                                                 Agency/Supplier: [employer]  Special Instructions: No driving smoking or alcohol  Continue aspirin 81 mg daily and Plavix 75 mg daily until 03/29/2021 then aspirin alone   My questions have been answered and I understand these instructions. I will adhere to these goals and the provided educational materials after my discharge from the hospital.  Patient/Caregiver Signature _______________________________ Date __________  Clinician Signature _______________________________________ Date __________  Please bring this form and your medication list with you to all your follow-up doctor's appointments.  STROKE/TIA DISCHARGE INSTRUCTIONS SMOKING Cigarette smoking nearly doubles your risk of having a stroke & is the single most alterable risk factor  If you smoke or have smoked in the last 12 months, you are advised to quit smoking for your health. Most of the excess cardiovascular risk related to smoking disappears within a year of  stopping. Ask you doctor about anti-smoking medications [place] Quit Line: 1-800-QUIT NOW Free Smoking Cessation Classes [phone]  CHOLESTEROL Know your levels; limit fat & cholesterol in your diet  Lipid Panel     Component Value Date/Time   CHOL 122 03/08/2021 0424   TRIG 109 03/08/2021 0424   HDL 17 (L) 03/08/2021 0424   CHOLHDL 7.2 03/08/2021 0424   VLDL 22 03/08/2021 0424   LDLCALC 83 03/08/2021 0424     Many patients benefit from treatment even if their cholesterol is at goal. Goal: Total Cholesterol (CHOL) less than 160 Goal:  Triglycerides (TRIG) less than 150 Goal:  HDL greater than 40 Goal:  LDL (LDLCALC) less than 100   BLOOD PRESSURE American Stroke Association blood pressure target is less that 120/80 mm/Hg  Your discharge blood pressure is:  BP: (!) 143/69 Monitor your blood pressure Limit your salt and alcohol intake Many individuals will require more than one medication for high blood pressure  DIABETES (A1c is a blood sugar average for last 3 months) Goal HGBA1c is under 7% (HBGA1c is blood sugar average for last 3 months)  Diabetes:    Lab Results  Component Value Date   HGBA1C 8.3 (H) 03/08/2021    Your HGBA1c can be lowered with medications, healthy diet, and exercise. Check your blood sugar as directed by your physician Call  your physician if you experience unexplained or low blood sugars.  PHYSICAL ACTIVITY/REHABILITATION Goal is 30 minutes at least 4 days per week  Activity: Increase activity slowly, Therapies: Physical Therapy: Home Health Return to work:  Activity decreases your risk of heart attack and stroke and makes your heart stronger.  It helps control your weight and blood pressure; helps you relax and can improve your mood. Participate in a regular exercise program. Talk with your doctor about the best form of exercise for you (dancing, walking, swimming, cycling).  DIET/WEIGHT Goal is to maintain a healthy weight  Your discharge diet is:   Diet Order             DIET DYS 3 Room service appropriate? Yes with Assist; Fluid consistency: Nectar Thick  Diet effective now                   liquids Your height is:  Height: 5\' 9"  (175.3 cm) Your current weight is: Weight: 84.7 kg Your Body Mass Index (BMI) is:  BMI (Calculated): 27.56 Following the type of diet specifically designed for you will help prevent another stroke. Your goal weight range is:   Your goal Body Mass Index (BMI) is 19-24. Healthy food habits can help reduce 3 risk factors for stroke:  High cholesterol, hypertension, and excess weight.  RESOURCES Stroke/Support Group:  Call [phone]   STROKE EDUCATION PROVIDED/REVIEWED AND GIVEN TO PATIENT Stroke warning signs and symptoms How to activate emergency medical system (call 911). Medications prescribed at discharge. Need for follow-up after discharge. Personal risk factors for stroke. Pneumonia vaccine given: No Flu vaccine given: No My questions have been answered, the writing is legible, and I understand these instructions.  I will adhere to these goals & educational materials that have been provided to me after my discharge from the hospital.

## 2021-03-13 NOTE — Evaluation (Signed)
Physical Therapy Assessment and Plan  Patient Details  Name: [name] MRN: [id number] Date of Birth: [birth year]  PT Diagnosis: Abnormal posture, Abnormality of gait, Difficulty walking, Hemiparesis non-dominant, Impaired sensation, and Muscle weakness Rehab Potential: Good ELOS: 3-3.5 weeks   Today's Date: 03/13/2021 PT Individual Time: 1022-1133 PT Individual Time Calculation (min): 36 min    Hospital Problem: Principal Problem:   Right pontine cerebrovascular accident [facility])   Past Medical History:  Past Medical History:  Diagnosis Date   Adenocarcinoma of left lung, stage 1 ([place]) 2001   T1N0 stage I  adenoca left lung resected 01/03/00    Alzheimer's disease ([place])    CAD (coronary artery disease) 2014   a. 09/2012 Cath: LM nl, LAD 50-60p, D1 60-70 m, D1 50ost, LCX nl, RCA min irregs, EF 55-65%.   Generalized anxiety disorder 01/08/2007   Qualifier: Diagnosis of  By: Diona Browner MD, [name]     History of colonic polyps 07/25/2011   Macular degeneration of both eyes 01/21/2014   Nonmelanoma skin cancer 07/25/2011   Multiple lesions excised face/nose   Pericarditis    a. 09/2012 with effusion and tamponade, s/p window.  b.  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 SCC IN SITU TX CX3   SCC (squamous cell carcinoma) 04/12/2005   RIGHT NECK SUP. SCC IN SITU TX EXC   SCC  (squamous cell carcinoma) 04/16/2013   LEFT TEMPLE SCC IN SITU TX CX3 5FU   SCC (squamous cell carcinoma) 04/16/2013   BELOW LEFT EYE SCC IN SITU TX CX3 5FU   SCC (squamous cell carcinoma) 04/16/2013   BELOW RIGHT EYE SCC IN SITU TX CX3 5FU   SCC (squamous cell carcinoma) 08/04/2014   LEFT CHEEK SCC IN SITU TX CX3 5FU   SCC (squamous cell carcinoma) 11/27/2018   LEFT TEMPLE SCC IN SITU TX WITH BX   SCC (squamous cell carcinoma)    SCC (squamous cell carcinoma)    SCC (squamous cell carcinoma)    SCC (squamous cell carcinoma) Well Diff 09/13/2016   Tip of Nose SCC WELL DIFF TX (MOH's), and RIGHT INNER EYE SUP. SCC IN SITU TX TO WATCH   SCC (squamous cell carcinoma) Well Diff 05/30/2017   Right Cheekbone (Cx3,5FU) and Under Left Eye (Cx3,5FU)   Squamous cell carcinoma in situ (SCCIS) 04/12/2005   Right Neck Inf (Cx3), Right Neck Sup (Exc), and Bulb of Nose (Cx3,Exc)   Squamous cell carcinoma in situ (SCCIS) 09/27/2005   Nose (Cx3,Exc)   Squamous cell carcinoma in situ (SCCIS) 02/18/2013   Below Left Eye (tx p bx)   Squamous cell carcinoma in situ (SCCIS) 04/16/2013   Left Temple (Cx3,5FU), Below Left Eye (Cx3,5FU), Below Right Eye (Cx3,5FU)   Squamous  cell carcinoma in situ (SCCIS) 08/04/2014   Left Cheek (Cx3,5FU)   Squamous cell carcinoma in situ (SCCIS) 09/13/2016   Right Inner Eye Sup. 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   Type 2 diabetes mellitus with vascular disease ([place]) 05/21/2007   Qualifier: Diagnosis of  By: Diona Browner MD, [name]     Type 2 DM with CKD stage 3 and hypertension ([place]) 07/16/2015   Unspecified essential hypertension    Past Surgical History:  Past Surgical History:   Procedure Laterality Date   HERNIA REPAIR     LEFT HEART CATHETERIZATION WITH CORONARY ANGIOGRAM N/A 09/13/2012   Procedure: LEFT HEART CATHETERIZATION WITH CORONARY ANGIOGRAM;  Surgeon: Sherren Mocha, MD;  Location[facility] C[facility] LAB;  Service: Cardiovascular;  Laterality: N/A;   LOBECTOMY  2001   upper left   PERICARDIAL TAP N/A 09/16/2012   Procedure: PERICARDIAL TAP;  Surgeon: Sinclair Grooms, MD;  Location[facility] CATH LAB;  Service: Cardiovascular;  Laterality: N/A;   PROSTATECTOMY  2003   SUBXYPHOID PERICARDIAL WINDOW N/A 09/16/2012   Procedure: SUBXYPHOID PERICARDIAL WINDOW;  Surgeon: Rexene Alberts, MD;  Location: [facility] OR;  Service: Thoracic;  Laterality: N/A;   TONSILLECTOMY      Assessment & Plan Clinical Impression: Patient is a 87 y.o. right-handed male with history of Alzheimer's dementia maintained on Aricept, type 2 diabetes mellitus, hypertension, CKD stage III, macular degeneration, adenocarcinoma of left lung with resection 2001, prostate cancer with prostatectom 2003 y, CAD, quit smoking 36 years ago..  Per chart review patient lives with spouse as well as a live-in caregiver who assist with meal preparation and ADLs.  Patient using rolling walker prior to admission.  1 level home with ramped entrance.  He also has good outside extended family.  Presented 03/07/2021 with left-sided weakness x2 to 3 days.  Cranial CT scan negative.  MRI identified small foci of acute ischemia within the ventral right pons and left frontal periventricular white matter.  No hemorrhage or mass-effect.  MRA with no intracranial large vessel occlusion.  Patient did not receive tPA.  Admission chemistries unremarkable except potassium 5.2 glucose 229 creatinine 1.50, alcohol negative, hemoglobin A1c 8.3, urinalysis negative nitrite.  Echocardiogram with ejection fraction of 50 to 55% no wall motion abnormalities grade 1 diastolic dysfunction.  Neurology follow-up maintained on aspirin 81 mg daily and Plavix 75 mg  daily for CVA prophylaxis x3 weeks then aspirin alone.  Lovenox for DVT prophylaxis.  Dysphagia #3 nectar thick liquid diet.  Therapy evaluations completed due to patient's left-sided weakness decreased functional mobility was admitted for a comprehensive rehab program. Patient transferred to CIR on 03/12/2021 .   Patient currently requires mod assist with mobility secondary to muscle weakness and muscle paralysis, decreased cardiorespiratoy endurance, impaired timing and sequencing and unbalanced muscle activation, decreased midline orientation, and decreased sitting balance, decreased standing balance, decreased postural control, hemiplegia, and decreased balance strategies.  Prior to hospitalization, patient was independent  with mobility and lived with Spouse, Other (Comment) (live in caregiver) in a House home.  Home access is  Ramped entrance.  Patient will benefit from skilled PT intervention to maximize safe functional mobility, minimize fall risk,  and decrease caregiver burden for planned discharge home with 24 hour assist.  Anticipate patient will benefit from follow up [place] at discharge.  PT - End of Session Activity Tolerance: Tolerates 30+ min activity with multiple rests Endurance Deficit: Yes Endurance Deficit Description: requires seated rest breaks PT Assessment Rehab Potential (ACUTE/IP ONLY): Good PT Barriers to Discharge: Nutrition means PT Patient demonstrates impairments in the following area(s): Balance;Perception;Behavior;Safety;Edema;Sensory;Endurance;Skin Integrity;Motor;Nutrition;Pain PT Transfers Functional Problem(s): Bed Mobility;Bed to Chair;Car;Furniture PT Locomotion Functional Problem(s): Ambulation;Wheelchair Mobility;Stairs PT Plan PT Intensity: Minimum of 1-2 x/day ,45 to 90 minutes PT Frequency: 5 out of 7 days PT Duration Estimated Length of Stay: 3-3.5 weeks PT Treatment/Interventions: Ambulation/gait training;Community reintegration;DME/adaptive equipment  instruction;Neuromuscular re-education;Psychosocial support;Stair training;UE/LE Strength taining/ROM;Wheelchair propulsion/positioning;Balance/vestibular training;Discharge planning;Functional electrical stimulation;Pain management;Skin care/wound management;Therapeutic Activities;UE/LE Coordination activities;Cognitive remediation/compensation;Disease management/prevention;Functional mobility training;Patient/family education;Splinting/orthotics;Therapeutic Exercise;Visual/perceptual remediation/compensation PT Transfers Anticipated Outcome(s): CGA using LRAD PT Locomotion Anticipated Outcome(s): CGA using LRAD PT Recommendation Recommendations for Other Services: Therapeutic Recreation consult Therapeutic Recreation Interventions: Stress management Follow Up Recommendations: Home health PT;24 hour supervision/assistance Patient destination: Home Equipment Recommended: To be determined   PT Evaluation Precautions/Restrictions Precautions Precautions: Fall;Other (comment) Precaution Comments: L hemi, posterior bias Restrictions Weight Bearing Restrictions: No Pain Pain Assessment Pain Scale: 0-10 Pain Score: 0-No pain Pain Interference Pain Interference Pain Effect on Sleep: 0. Does not apply - I have not had any pain or hurting in the past 5 days Pain Interference with Therapy Activities: 1. Rarely or not at all Pain Interference with Day-to-Day Activities: 1. Rarely or not at all Home Living/Prior [place] Available Help at Discharge: Family;Personal care attendant;Available 24 hours/day;Available PRN/intermittently;Other (Comment) (family available PRN - live-in PCA available 24hr) Type of Home: House Home Access: Ramped entrance Home Layout: One level  Lives With: Spouse;Other (Comment) (wife's ([name]) caregiver, [place], is a live in for over a year who primarily assists with pt's wife but pt was completely independent - caregiver helps with household tasks & can  drive pt to/from appointments) Prior Function Level of Independence: Independent with transfers;Independent with gait;Independent with homemaking with ambulation (completely independent with functional mobility without using AD - walked dog 2-3x/day until it recently passed - caregiver would just help with tasks like mowing yard)  Able to Take Stairs?: Yes Driving: No (due to macular degeneration) 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, maintenance (IT) of [employer] to See in Adequate Light: 1 Impaired Perception Perception: Within Functional Limits Praxis Praxis: Intact  Cognition Overall Cognitive Status: History of cognitive impairments - at baseline (per chart pt w/ hx of dementia; however, family reports he has no true memory impairments) Arousal/Alertness: Awake/alert Orientation Level: Oriented X4 Year: 2022 Month: December Day of Week: Correct Attention: Focused;Sustained Focused Attention: Appears intact Sustained Attention: Appears intact Memory:  (pt's daughter reports pt does not have memory impairments at baseline) Safety/Judgment: Appears intact Comments: Daughter, wife, and pt all confirm that pt is at baseline level of cognitive functioning Sensation Sensation Light Touch: Appears Intact Hot/Cold: Not tested Proprioception: Impaired by gross assessment Stereognosis: Not tested Coordination Gross Motor Movements are Fluid and Coordinated: No Coordination and Movement Description: GM movements impaired due to L hemiplegia Motor  Motor Motor: Hemiplegia;Abnormal postural alignment and control Motor - Skilled Clinical Observations: L hemiplegia, posterior bias in stance   Trunk/Postural Assessment  Cervical Assessment Cervical Assessment: Exceptions to [facility] (forward head) Thoracic Assessment Thoracic Assessment: Exceptions to [facility] (rounded shoulders) Lumbar Assessment Lumbar Assessment: Exceptions to [facility]  (posterior pelvic tilt  in sitting) Postural Control Postural Control: Deficits on evaluation Postural Limitations: posterior bias in standing requiring assist to maintain upright  Balance Balance Balance Assessed: Yes Standardized Balance Assessment Standardized Balance Assessment: Berg Balance Test Berg Balance Test Sit to Stand: Needs moderate or maximal assist to stand Standing Unsupported: Needs several tries to stand 30 seconds unsupported Sitting with Back Unsupported but Feet Supported on Floor or Stool: Able to sit 2 minutes under supervision Stand to Sit: Needs assistance to sit Transfers: Needs two people to assist of supervise to be safe Standing Unsupported with Eyes Closed: Needs help to keep from falling Standing Ubsupported with Feet Together: Needs help to attain position and unable to hold for 15 seconds From Standing, Reach Forward with Outstretched Arm: Loses balance while trying/requires external support From Standing Position, Pick up Object from Floor: Unable to try/needs assist to keep balance From Standing Position, Turn to Look Behind Over each Shoulder: Needs assist to keep from losing balance and falling Turn 360 Degrees: Needs assistance while turning Standing Unsupported, Alternately Place Feet on Step/Stool: Needs assistance to keep from falling or unable to try Standing Unsupported, One Foot in Front: Loses balance while stepping or standing Standing on One Leg: Unable to try or needs assist to prevent fall Total Score: 4 Static Sitting Balance Static Sitting - Balance Support: Feet supported Static Sitting - Level of Assistance: 5: Stand by assistance Dynamic Sitting Balance Dynamic Sitting - Balance Support: Feet unsupported Dynamic Sitting - Level of Assistance: 4: Min assist Static Standing Balance Static Standing - Balance Support: During functional activity Static Standing - Level of Assistance: 3: Mod assist Dynamic Standing Balance Dynamic  Standing - Balance Support: During functional activity;Bilateral upper extremity supported Dynamic Standing - Level of Assistance: Other (comment) (+2 mod assist) Extremity Assessment      RLE Assessment RLE Assessment: Exceptions to [facility] Active Range of Motion (AROM) Comments: WFL RLE Strength Right Hip Flexion: 4+/5 Right Knee Flexion: 4+/5 Right Knee Extension: 4+/5 Right Ankle Dorsiflexion: 4/5 Right Ankle Plantar Flexion: 4/5 LLE Assessment LLE Assessment: Exceptions to [facility] Active Range of Motion (AROM) Comments: WFL LLE Strength Left Hip Flexion: 2+/5 Left Knee Flexion: 2-/5 Left Knee Extension: 2+/5 Left Ankle Dorsiflexion: 0/5 Left Ankle Plantar Flexion: 2-/5  Care Tool Care Tool Bed Mobility Roll left and right activity   Roll left and right assist level: Minimal Assistance - Patient > 75%    Sit to lying activity   Sit to lying assist level: Moderate Assistance - Patient 50 - 74%    Lying to sitting on side of bed activity   Lying to sitting on side of bed assist level: the ability to move from lying on the back to sitting on the side of the bed with no back support.: Moderate Assistance - Patient 50 - 74%     Care Tool Transfers Sit to stand transfer   Sit to stand assist level: Moderate Assistance - Patient 50 - 74%    Chair/bed transfer   Chair/bed transfer assist level: Moderate Assistance - Patient 50 - 74%     Psychologist, counselling transfer activity did not occur: Safety/medical concerns        Care Tool Locomotion Ambulation Ambulation activity did not occur: Safety/medical concerns        Walk 10 feet activity Walk 10 feet activity did not occur: Safety/medical concerns       Walk 50 feet with 2 turns  activity Walk 50 feet with 2 turns activity did not occur: Safety/medical concerns      Walk 150 feet activity Walk 150 feet activity did not occur: Safety/medical concerns      Walk 10 feet on uneven surfaces activity  Walk 10 feet on uneven surfaces activity did not occur: Safety/medical concerns      Stairs Stair activity did not occur: Safety/medical concerns        Walk up/down 1 step activity Walk up/down 1 step or curb (drop down) activity did not occur: Safety/medical concerns      Walk up/down 4 steps activity Walk up/down 4 steps activity did not occur: Safety/medical concerns      Walk up/down 12 steps activity Walk up/down 12 steps activity did not occur: Safety/medical concerns      Pick up small objects from floor   Pick up small object from the floor assist level: Dependent - Patient 0%    Wheelchair Is the patient using a wheelchair?: Yes Type of Wheelchair: Manual   Wheelchair assist level: Dependent - Patient 0%    Wheel 50 feet with 2 turns activity   Assist Level: Dependent - Patient 0%  Wheel 150 feet activity   Assist Level: Dependent - Patient 0%    Refer to Care Plan for Long Term Goals  SHORT TERM GOAL WEEK 1 PT Short Term Goal 1 (Week 1): Pt will perform supine<>sit with min assist PT Short Term Goal 2 (Week 1): Pt will perform sit<>stand with min assist using LRAD PT Short Term Goal 3 (Week 1): Pt will perform bed<>chair transfer using LRAD with min assist PT Short Term Goal 4 (Week 1): Pt will ambulate at least 47f using LRAD with mod assist of 1 PT Short Term Goal 5 (Week 1): Pt will demonstrate improvement in Berg Balance Test by at least 7 points  Recommendations for other services: Therapeutic Recreation  Stress management  Skilled Therapeutic Intervention Pt received sitting in recliner with his daughter, TLynelle Smoke present and pt agreeable to therapy session. Evaluation completed (see details above) with patient education regarding purpose of PT evaluation, PT POC and goals, therapy schedule, weekly team meetings, and other CIR information including safety plan and fall risk safety. Sit>stand recliner>STEDY with light mod assist for rising to stand and LUE  support. Stedy transfer to w/c as this recliner does not have a drop arm to allow squat pivots.  Transported to/from gym in w/c for time management and energy conservation. Squat pivot transfers w/c<>EOM with mod assist for lifting/pivoting hips - manual facilitation for increased L LE WBing and cuing for head/hips relationship. [facility] in [facility] demonstrates increased fall risk as noted by score of 4/56.  (<36= high risk for falls, close to 100%; 37-45 significant >80%; 46-51 moderate >50%; 52-55 lower >25%). Gait training 318fusing RW - therapist provided AD with L hand orthosis - with mod assist for balance and AD management with +2 w/c follow - pt able to advance L LE during swing without assist though has toe drag and scissoring - able to maintain L knee control during stance with guarding for safety. Supine<>sit on mat table with mod assist for L hemibody management and trunk control. Supine bridging 2x15 reps targeting L LE NMR and increased glute and hip abductor activation. Repeated sit<>stands to/from EOM x8 reps with progressively lighter mod assist as pt initially has posterior lean that improves- guarding L knee but no true buckling - mirror feedback and cuing  for improved midline as pt tends to compensate by shifting weight to R. At end of session pt agreeable to remain sitting in w/c for meal - left with needs in reach, L UE supported on pillow, and his daughter present.   Mobility Bed Mobility Bed Mobility: Supine to Sit;Sit to Supine Supine to Sit: Moderate Assistance - Patient 50-74% Sit to Supine: Moderate Assistance - Patient 50-74% Transfers Transfers: Sit to Stand;Stand to Sit;Squat Pivot Transfers Sit to Stand: Moderate Assistance - Patient 50-74% (R posterior lean upon rising) Stand to Sit: Moderate Assistance - Patient 50-74% Squat Pivot Transfers: Moderate Assistance - Patient 50-74% Transfer (Assistive device): None Locomotion  Gait Ambulation:  Yes Gait Assistance: 2 Helpers;Moderate Assistance - Patient 50-74% (mod assist of 1 and +2 w/c follow) Gait Distance (Feet): 30 Feet Assistive device: Rolling walker Gait Assistance Details: Verbal cues for sequencing;Verbal cues for safe use of DME/AE;Verbal cues for technique;Verbal cues for gait pattern;Tactile cues for placement;Tactile cues for weight shifting;Tactile cues for initiation;Tactile cues for sequencing;Tactile cues for posture;Tactile cues for weight beaing;Verbal cues for precautions/safety;Manual facilitation for placement;Manual facilitation for weight shifting (manual assist for AD management - manual assist for weight shifting (pt with R posterior lean) - guarding L knee but no buckling noted, but does scissor) Gait Gait: Yes Gait Pattern: Impaired Gait Pattern: Step-through pattern;Decreased step length - left;Decreased step length - right;Decreased stance time - left;Poor foot clearance - left;Scissoring (L LE scissors & has L toe drag during swing & L knee stays in full extension during stance) Stairs / Additional Locomotion Stairs: No Wheelchair Mobility Wheelchair Mobility: No   Discharge Criteria: Patient will be discharged from PT if patient refuses treatment 3 consecutive times without medical reason, if treatment goals not met, if there is a change in medical status, if patient makes no progress towards goals or if patient is discharged from hospital.  The above assessment, treatment plan, treatment alternatives and goals were discussed and mutually agreed upon: by patient and by family  Tawana Scale , PT, DPT, NCS, CSRS 03/13/2021, 7:54 AM

## 2021-03-13 NOTE — Progress Notes (Signed)
+/-   sleep. Continent using urinal. LBM 12/9. Removed NSL to left AC, patient reports tender to touch and erythema noted. Patient reports Discolored patches to RLE have been there > 1 year. [name] A

## 2021-03-13 NOTE — Evaluation (Signed)
Occupational Therapy Assessment and Plan  Patient Details  Name: [name] MRN: [id number] Date of Birth: 03-16-1931  OT Diagnosis: abnormal posture, blindness and low vision, hemiplegia affecting non-dominant side, muscle weakness (generalized), and decreased postural control, dynamic standing and static standing balance, decreased activity tolerance Rehab Potential: Rehab Potential (ACUTE ONLY): Good ELOS: 3-3.5 weeks   Today's Date: 03/13/2021 OT Individual Time: 0801-0900 OT Individual Time Calculation (min): 59 min     Hospital Problem: Principal Problem:   Right pontine cerebrovascular accident [facility])   Past Medical History:  Past Medical History:  Diagnosis Date   Adenocarcinoma of left lung, stage 1 ([place]) 2001   T1N0 stage I  adenoca left lung resected 01/03/00    Alzheimer's disease ([place])    CAD (coronary artery disease) 2014   a. 09/2012 Cath: LM nl, LAD 50-60p, D1 60-70 m, D1 50ost, LCX nl, RCA min irregs, EF 55-65%.   Generalized anxiety disorder 01/08/2007   Qualifier: Diagnosis of  By: Diona Browner MD, [name]     History of colonic polyps 07/25/2011   Macular degeneration of both eyes 01/21/2014   Nonmelanoma skin cancer 07/25/2011   Multiple lesions excised face/nose   Pericarditis    a. 09/2012 with effusion and tamponade, s/p window.  b.  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 SCC IN SITU TX CX3   SCC (squamous cell carcinoma) 04/12/2005   RIGHT NECK SUP. SCC IN SITU TX EXC   SCC (squamous cell carcinoma) 04/16/2013   LEFT TEMPLE SCC IN SITU TX CX3 5FU   SCC (squamous cell carcinoma) 04/16/2013   BELOW LEFT EYE SCC IN SITU TX CX3 5FU   SCC (squamous cell carcinoma) 04/16/2013   BELOW RIGHT EYE SCC IN SITU TX CX3 5FU   SCC (squamous cell carcinoma) 08/04/2014   LEFT CHEEK SCC IN SITU TX CX3 5FU   SCC (squamous cell carcinoma) 11/27/2018   LEFT TEMPLE SCC IN SITU TX WITH BX   SCC (squamous cell carcinoma)    SCC (squamous cell carcinoma)    SCC (squamous cell carcinoma)    SCC (squamous cell carcinoma) Well Diff 09/13/2016   Tip of Nose SCC WELL DIFF TX (MOH's), and RIGHT INNER EYE SUP. SCC IN SITU TX TO WATCH   SCC (squamous cell carcinoma) Well Diff 05/30/2017   Right Cheekbone (Cx3,5FU) and Under Left Eye (Cx3,5FU)   Squamous cell carcinoma in situ (SCCIS) 04/12/2005   Right Neck Inf (Cx3), Right Neck Sup (Exc), and Bulb of Nose (Cx3,Exc)   Squamous cell carcinoma in situ (SCCIS) 09/27/2005   Nose (Cx3,Exc)   Squamous cell carcinoma in situ (SCCIS) 02/18/2013   Below Left Eye (tx p bx)   Squamous cell carcinoma in situ (SCCIS)  04/16/2013   Left Temple (Cx3,5FU), Below Left Eye (Cx3,5FU), Below Right Eye (Cx3,5FU)   Squamous cell carcinoma in situ (SCCIS) 08/04/2014   Left Cheek (Cx3,5FU)   Squamous cell carcinoma in situ (SCCIS) 09/13/2016   Right Inner Eye Sup. 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   Type 2 diabetes mellitus with vascular disease ([place]) 05/21/2007   Qualifier: Diagnosis of  By: Diona Browner MD, [name]     Type 2 DM with CKD  stage 3 and hypertension ([place]) 07/16/2015   Unspecified essential hypertension    Past Surgical History:  Past Surgical History:  Procedure Laterality Date   HERNIA REPAIR     LEFT HEART CATHETERIZATION WITH CORONARY ANGIOGRAM N/A 09/13/2012   Procedure: LEFT HEART CATHETERIZATION WITH CORONARY ANGIOGRAM;  Surgeon: Sherren Mocha, MD;  Location: [facility] CATH LAB;  Service: Cardiovascular;  Laterality: N/A;   LOBECTOMY  2001   upper left   PERICARDIAL TAP N/A 09/16/2012   Procedure: PERICARDIAL TAP;  Surgeon: Sinclair Grooms, MD;  Location[facility] CATH LAB;  Service: Cardiovascular;  Laterality: N/A;   PROSTATECTOMY  2003   SUBXYPHOID PERICARDIAL WINDOW N/A 09/16/2012   Procedure: SUBXYPHOID PERICARDIAL WINDOW;  Surgeon: Rexene Alberts, MD;  Location: [facility] OR;  Service: Thoracic;  Laterality: N/A;   TONSILLECTOMY      Assessment & Plan Clinical Impression: Patient is a 87 y.o. year old male with  history of Alzheimer's dementia maintained on Aricept, type 2 diabetes mellitus, hypertension, CKD stage III, macular degeneration, adenocarcinoma of left lung with resection 2001, prostate cancer with prostatectom 2003 y, CAD, quit smoking 36 years ago..  Per chart review patient lives with spouse as well as a live-in caregiver who assist with meal preparation and ADLs.  Patient using rolling walker prior to admission.  1 level home with ramped entrance.  He also has good outside extended family.  Presented 03/07/2021 with left-sided weakness x2 to 3 days.  Cranial CT scan negative.  MRI identified small foci of acute ischemia within the ventral right pons and left frontal periventricular white matter.  No hemorrhage or mass-effect.  MRA with no intracranial large vessel occlusion.  Patient did not receive tPA.  Admission chemistries unremarkable except potassium 5.2 glucose 229 creatinine 1.50, alcohol negative, hemoglobin A1c 8.3, urinalysis negative nitrite.  Echocardiogram with ejection fraction of 50 to 55% no  wall motion abnormalities grade 1 diastolic dysfunction.  Neurology follow-up maintained on aspirin 81 mg daily and Plavix 75 mg daily for CVA prophylaxis x3 weeks then aspirin alone.  Lovenox for DVT prophylaxis.  Dysphagia #3 nectar thick liquid diet.  Therapy evaluations completed due to patient's left-sided weakness decreased functional mobility was admitted for a comprehensive rehab program. Patient transferred to CIR on 03/12/2021 .    Patient currently requires  mod to max  with basic self-care skills secondary to muscle weakness, decreased cardiorespiratoy endurance, impaired timing and sequencing, abnormal tone, unbalanced muscle activation, decreased coordination, and decreased motor planning, decreased visual acuity, and decreased standing balance, decreased postural control, hemiplegia, and decreased balance strategies.  Prior to hospitalization, patient could complete BADL/functional mobility with independent .  Patient will benefit from skilled intervention to  decrease level of assist with basic self-care skills and increase independence with basic self-care skills prior to discharge home with care partner.  Anticipate patient will require intermittent supervision and follow up home health and follow up outpatient.  OT - End of Session Activity Tolerance: Tolerates 10 - 20 min activity with multiple rests Endurance Deficit: Yes OT Assessment Rehab Potential (ACUTE ONLY): Good OT Barriers to Discharge: Inaccessible home environment;Home environment access/layout;Insurance for SNF coverage OT Patient demonstrates impairments in the following area(s): Warehouse manager;Endurance;Motor;Safety;Perception;Vision OT Basic ADL's Functional Problem(s): Eating;Grooming;Bathing;Dressing;Toileting OT Transfers Functional Problem(s): Toilet;Tub/Shower OT Additional Impairment(s): Fuctional Use of Upper Extremity OT Plan OT Intensity: Minimum of 1-2 x/day, 45 to 90 minutes OT Frequency: 5 out  of 7 days OT Duration/Estimated Length of Stay: 3-3.5 weeks OT Treatment/Interventions: Balance/vestibular training;Disease mangement/prevention;Neuromuscular re-education;Self Care/advanced ADL retraining;Therapeutic Exercise;Wheelchair propulsion/positioning;UE/LE Strength taining/ROM;Skin care/wound managment;Pain management;DME/adaptive equipment instruction;Cognitive remediation/compensation;Community reintegration;Functional electrical stimulation;Patient/family education;Splinting/orthotics;UE/LE Coordination activities;Visual/perceptual remediation/compensation;Therapeutic Activities;Psychosocial support;Functional mobility training;Discharge planning OT Self Feeding Anticipated Outcome(s): mod I OT Basic Self-Care Anticipated Outcome(s): S OT Toileting Anticipated Outcome(s): S OT Bathroom Transfers Anticipated Outcome(s): CGA OT Recommendation Patient destination: Home Follow Up Recommendations: Home health OT;Outpatient OT Equipment Recommended: To be determined   OT Evaluation Precautions/Restrictions  Precautions Precaution Comments: L hemi, posterior bias Restrictions Weight Bearing Restrictions: No General Chart Reviewed: Yes Family/Caregiver Present: Yes (daughter)  Pain Pain Assessment Pain Scale: 0-10 Pain Score: 0-No pain Home Living/Prior Functioning Home Living Family/patient expects to be discharged to:: Private residence Living Arrangements: Spouse/significant other, Other (Comment) (care giver) Available Help at Discharge: Family, Personal care attendant, Available 24 hours/day, Available PRN/intermittently, Other (Comment) Type of Home: House Home Access: Ramped entrance Home Layout: One level Bathroom Shower/Tub: Multimedia programmer: Handicapped height Bathroom Accessibility: Yes Additional Comments: Juanita, Live in caregiver is for his wife  Lives With: Spouse, Other (Comment) (wife's ([name]) caregiver, [place], is a live in for over a  year who primarily assists with pt's wife but pt was completely independent - caregiver helps with household tasks & can drive pt to/from appointments) IADL History Homemaking Responsibilities: No Occupation: Retired Tax adviser: enjoyed walking his dog Honey before she passed away Prior Function Level of Independence: Independent with transfers, Independent with gait, Independent with homemaking with ambulation, Independent with basic ADLs (completely independent with functional mobility without using AD - walked dog 2-3x/day until it recently passed - caregiver would just help with tasks like mowing yard)  Able to Take Stairs?: Yes Driving: No (due to macular degeneration) 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, maintenance (IT) of medical Vision Baseline Vision/History: 6 Macular Degeneration;1 Wears glasses (bifocals) Ability to See in Adequate Light: 1 Impaired Patient Visual Report: Other (comment);Blurring of vision (reports he has had a change in his vision since the CVA) Vision Assessment?: Yes Eye Alignment: Within Functional Limits Ocular Range of Motion: Within Functional Limits Alignment/Gaze Preference: Within Defined Limits Tracking/Visual Pursuits: Requires cues, head turns, or add eye shifts to track Saccades: Additional head turns occurred during testing;Decreased speed of saccadic movement Convergence: Impaired (comment) Visual Fields: No apparent deficits Perception  Perception: Within Functional Limits Praxis Praxis: Intact Cognition Overall Cognitive Status: History of cognitive impairments - at baseline Arousal/Alertness: Awake/alert Orientation Level: Person;Place;Situation Person: Oriented Place: Oriented Situation: Oriented Year: 2022 Month: December Day of Week: Correct Memory:  (pt's daughter reports pt does not have memory impairments at baseline) Immediate Memory Recall: Sock;Blue;Bed Memory Recall Sock: Without Cue Memory  Recall Blue: Without Cue Memory Recall Bed: Without Cue Attention:  Focused;Sustained Focused Attention: Appears intact Sustained Attention: Appears intact Problem Solving: Appears intact Safety/Judgment: Impaired Comments: Required cues to wait for therapist assist prior to attempt transfer. Sensation Sensation Light Touch: Appears Intact Hot/Cold: Not tested Proprioception: Impaired by gross assessment Stereognosis: Impaired by gross assessment Additional Comments: L hemiplegia Coordination Gross Motor Movements are Fluid and Coordinated: No Fine Motor Movements are Fluid and Coordinated: No Coordination and Movement Description: L hemiplegia Finger Nose Finger Test: unable to complete on L Motor  Motor Motor: Hemiplegia Motor - Skilled Clinical Observations: L hemiplegia, posterior bias in stance  Trunk/Postural Assessment  Cervical Assessment Cervical Assessment: Exceptions to [facility] (forward head) Thoracic Assessment Thoracic Assessment: Exceptions to [facility] (kyphotic) Lumbar Assessment Lumbar Assessment: Exceptions to [facility] (posterior pelvic tilt) Postural Control Postural Control: Deficits on evaluation (posterior bias in stance)  Balance Balance Balance Assessed: Yes Standardized Balance Assessment Standardized Balance Assessment: Berg Balance Test Berg Balance Test Sit to Stand: Needs moderate or maximal assist to stand Standing Unsupported: Needs several tries to stand 30 seconds unsupported Sitting with Back Unsupported but Feet Supported on Floor or Stool: Able to sit 2 minutes under supervision Stand to Sit: Needs assistance to sit Transfers: Needs two people to assist of supervise to be safe Standing Unsupported with Eyes Closed: Needs help to keep from falling Standing Ubsupported with Feet Together: Needs help to attain position and unable to hold for 15 seconds From Standing, Reach Forward with Outstretched Arm: Loses balance while trying/requires external  support From Standing Position, Pick up Object from Floor: Unable to try/needs assist to keep balance From Standing Position, Turn to Look Behind Over each Shoulder: Needs assist to keep from losing balance and falling Turn 360 Degrees: Needs assistance while turning Standing Unsupported, Alternately Place Feet on Step/Stool: Needs assistance to keep from falling or unable to try Standing Unsupported, One Foot in Front: Loses balance while stepping or standing Standing on One Leg: Unable to try or needs assist to prevent fall Total Score: 4 Static Sitting Balance Static Sitting - Balance Support: Feet supported Static Sitting - Level of Assistance: 5: Stand by assistance Dynamic Sitting Balance Dynamic Sitting - Balance Support: Feet unsupported Dynamic Sitting - Level of Assistance: 4: Min assist Static Standing Balance Static Standing - Balance Support: Right upper extremity supported Static Standing - Level of Assistance: 3: Mod assist Dynamic Standing Balance Dynamic Standing - Level of Assistance: Not tested (comment) Extremity/Trunk Assessment RUE Assessment RUE Assessment: Within Functional Limits LUE Assessment LUE Assessment: Exceptions to [facility] Active Range of Motion (AROM) Comments: >90 degrees shoulder flexion General Strength Comments: UE 2/5 deltoid, 3/5 bicep, 2+ tricep, 1+ to 2- wrist/fingers. LLE 2+ to 3/5 LUE Body System: Neuro Brunstrum levels for arm and hand: Arm;Hand Brunstrum level for arm: Stage II Synergy is developing Brunstrum level for hand: Stage II Synergy is developing  Care Tool Care Tool Self Care Eating   Eating Assist Level: Supervision/Verbal cueing    Oral Care    Oral Care Assist Level: Supervision/Verbal cueing    Bathing   Body parts bathed by patient: Left lower leg;Face;Left arm;Chest;Abdomen;Right upper leg;Buttocks;Front perineal area;Left upper leg;Right lower leg Body parts bathed by helper: Buttocks;Right arm   Assist Level:  Moderate Assistance - Patient 50 - 74%    Upper Body Dressing(including orthotics)   What is the patient wearing?: Button up shirt;Hospital gown only   Assist Level: Moderate Assistance - Patient 50 - 74%    Lower Body Dressing (excluding footwear)   What is  the patient wearing?: Incontinence brief;Pants Assist for lower body dressing: Maximal Assistance - Patient 25 - 49%    Putting on/Taking off footwear   What is the patient wearing?: Non-skid slipper socks Assist for footwear: Total Assistance - Patient < 25%       Care Tool Toileting Toileting activity   Assist for toileting: Dependent - Patient 0% (in stedy)     Care Tool Bed Mobility Roll left and right activity        Sit to lying activity        Lying to sitting on side of bed activity   Lying to sitting on side of bed assist level: the ability to move from lying on the back to sitting on the side of the bed with no back support.: Minimal Assistance - Patient > 75%     Care Tool Transfers Sit to stand transfer   Sit to stand assist level: Moderate Assistance - Patient 50 - 74%    Chair/bed transfer   Chair/bed transfer assist level: Maximal Assistance - Patient 25 - 49% (squat-pivot to his R)     Toilet transfer   Assist Level: Dependent - Patient 0% (stedy)     Care Tool Cognition  Expression of Ideas and Wants Expression of Ideas and Wants: 4. Without difficulty (complex and basic) - expresses complex messages without difficulty and with speech that is clear and easy to understand  Understanding Verbal and Non-Verbal Content Understanding Verbal and Non-Verbal Content: 3. Usually understands - understands most conversations, but misses some part/intent of message. Requires cues at times to understand   Memory/Recall Ability Memory/Recall Ability : Current season;That he or she is in a hospital/hospital unit   Refer to Care Plan for [place] 1 OT Short Term Goal 1 (Week 1):  Pt will complete grooming task in stance with min A. OT Short Term Goal 2 (Week 1): Pt will don pants with mod A. OT Short Term Goal 3 (Week 1): Pt will don shirt with min A. OT Short Term Goal 4 (Week 1): Pt will independently state 1 method for therapuetic positiong on LUE with no more than min VCs.  Recommendations for other services: None    Skilled Therapeutic Intervention ADL ADL Eating: Supervision/safety;Set up Where Assessed-Eating: Bed level;Wheelchair Grooming: Supervision/safety;Setup Where Assessed-Grooming: Sitting at sink Upper Body Bathing: Minimal assistance Where Assessed-Upper Body Bathing: Sitting at sink Lower Body Bathing: Moderate assistance Where Assessed-Lower Body Bathing: Standing at sink Upper Body Dressing: Moderate assistance Where Assessed-Upper Body Dressing: Sitting at sink Lower Body Dressing: Maximal assistance Where Assessed-Lower Body Dressing: Standing at sink Toileting: Maximal assistance Where Assessed-Toileting: Bedside Commode Toilet Transfer: Maximal assistance Toilet Transfer Method: Squat pivot Toilet Transfer Equipment: Drop arm bedside commode Tub/Shower Transfer: Not assessed Social research officer, government: Not assessed Mobility  Bed Mobility Bed Mobility: Supine to Sit Supine to Sit: Minimal Assistance - Patient > 75% Sit to Supine: Moderate Assistance - Patient 50-74% Transfers Sit to Stand: Moderate Assistance - Patient 50-74% Stand to Sit: Moderate Assistance - Patient 50-74%  Session Note : Pt received semi-reclined in bed starting breakfast with daughter and RN present, denies pain and agreeable to OT eval. MD in/out morning assessment.  Reviewed role of CIR OT, evaluation process, ADL/func mobility retraining, goals for therapy, and safety plan. Evaluation completed as documented above. Came to sitting EOB with min A to progress LLE off bed and to lift trunk. CGA for static sitting balance and B  feet on floor. Sit to stand with  mod A due to posterior lean and to block L knee. Squat-pivot to his R > w/c with max A to lift hips and for RUE/LLE placement. Completed sponge bathing at sink with mod A for standing balance to reach periarea. Donned shirt with mod A to thread LUE and to button up. Max A to don brief/pants, sit to stand x3 at sink improving to min to mod A due to posterior/R lean. Completed oral care seated with S and set-up of materials. Issued soft tan theraputty and sponge to facilitaite functional L grasp, pt very motivated to complete. Pt left seated in w/c with daughter present with safety belt alarm engaged, call bell in reach, and all immediate needs met.   Discharge Criteria: Patient will be discharged from OT if patient refuses treatment 3 consecutive times without medical reason, if treatment goals not met, if there is a change in medical status, if patient makes no progress towards goals or if patient is discharged from hospital.  The above assessment, treatment plan, treatment alternatives and goals were discussed and mutually agreed upon: by patient and by family  Volanda Napoleon MS, OTR/L  03/13/2021, 12:53 PM

## 2021-03-14 DIAGNOSIS — I6350 Cerebral infarction due to unspecified occlusion or stenosis of unspecified cerebral artery: Secondary | ICD-10-CM | POA: Diagnosis not present

## 2021-03-14 LAB — GLUCOSE, CAPILLARY
Glucose-Capillary: 185 mg/dL — ABNORMAL HIGH (ref 70–99)
Glucose-Capillary: 217 mg/dL — ABNORMAL HIGH (ref 70–99)
Glucose-Capillary: 185 mg/dL — ABNORMAL HIGH (ref 70–99)
Glucose-Capillary: 179 mg/dL — ABNORMAL HIGH (ref 70–99)

## 2021-03-14 NOTE — Progress Notes (Signed)
At [place], patient complained of LLE "jerking". No problems with this prior to tonight. PRN tylenol given at [place] and repositioned patient's legs. No further complaint of. Expiratory wheeze heard throughout. Reports intermittent wheezing since lobectomy. Hasn't used MDI or Neb treatments, PTA,  usually goes away after rest. Denies feeling SOB. O2 sats 97% RA. Spilled urinal vs incontinent  of urine Wears incontinence brief from home. Encouraged fluids. Likes thickened juices, spoke with patient about decreasing  juices and increasing nectar water to help better manage his blood sugars. Patient agreeable. [name] A

## 2021-03-14 NOTE — Progress Notes (Signed)
PROGRESS NOTE   Subjective/Complaints:  Pt reports doing "fair"- sleepy- slept well.  2 good BM's yesterday.  L leg jumpy- which is new.  Walked 30 ft min A with RW with therapy.   ROS:  Pt denies increased SOB, abd pain, CP, N/V/C/D, and vision changes   Objective:   No results found. No results for input(s): WBC, HGB, HCT, PLT in the last 72 hours. No results for input(s): NA, K, CL, CO2, GLUCOSE, BUN, CREATININE, CALCIUM in the last 72 hours.   Intake/Output Summary (Last 24 hours) at 03/14/2021 1213 Last data filed at 03/14/2021 0506 Gross per 24 hour  Intake 720 ml  Output 300 ml  Net 420 ml        Physical Exam: Vital Signs Blood pressure 137/69, pulse 70, temperature 97.9 F (36.6 C), resp. rate 17, height 5\' 9"  (1.753 m), weight 85.7 kg, SpO2 98 %.     General: awake, alert, appropriate, laying in bed; has multiple juices at bedside; son at bedside; NAD HENT: conjugate gaze; oropharynx moist CV: regular rate; no JVD Pulmonary: decreased at bases and LUL- however a few wheezes heard- adequate air movement GI: soft, NT, ND, (+)BS Psychiatric: appropriate- interactive Neurological: alert- no LLE jerking seen, but describes spasticity?  Musculoskeletal:     Cervical back: Normal range of motion and neck supple.     Comments: Full ROM, No pain with AROM or PROM in the neck, trunk, or extremities. Posture appropriate   Skin:    General: Skin is warm and dry.  Neurological:     Mental Status: He is alert.     Comments: Patient is alert.  Makes eye contact with examiner.  He was able to provide his name place, month, year, why he's here.  Follows basic commands. Left central 7 and tongue deviation. LUE 2/5 deltoid, 3/5 bicep, 2+ tricep, 1+ to 2- wrist/fingers. LLE 2+ to 3/5. RUE and RLE 4 to 4+/5 prox to distal. Sensory exam normal for light touch and pain in all 4 limbs. No limb ataxia or cerebellar  signs. No abnormal tone appreciated.  No abnormal tone   Assessment/Plan: 1. Functional deficits which require 3+ hours per day of interdisciplinary therapy in a comprehensive inpatient rehab setting. Physiatrist is providing close team supervision and 24 hour management of active medical problems listed below. Physiatrist and rehab team continue to assess barriers to discharge/monitor patient progress toward functional and medical goals  Care Tool:  Bathing    Body parts bathed by patient: Left lower leg, Face, Left arm, Chest, Abdomen, Right upper leg, Buttocks, Front perineal area, Left upper leg, Right lower leg   Body parts bathed by helper: Buttocks, Right arm     Bathing assist Assist Level: Moderate Assistance - Patient 50 - 74%     Upper Body Dressing/Undressing Upper body dressing   What is the patient wearing?: Button up shirt, Hospital gown only    Upper body assist Assist Level: Moderate Assistance - Patient 50 - 74%    Lower Body Dressing/Undressing Lower body dressing      What is the patient wearing?: Underwear/pull up, Pants     Lower body assist Assist  for lower body dressing: Moderate Assistance - Patient 50 - 74%     Toileting Toileting    Toileting assist Assist for toileting: Dependent - Patient 0% (incontient X 1)     Transfers Chair/bed transfer  Transfers assist     Chair/bed transfer assist level: Moderate Assistance - Patient 50 - 74%     Locomotion Ambulation   Ambulation assist   Ambulation activity did not occur: Safety/medical concerns          Walk 10 feet activity   Assist  Walk 10 feet activity did not occur: Safety/medical concerns        Walk 50 feet activity   Assist Walk 50 feet with 2 turns activity did not occur: Safety/medical concerns         Walk 150 feet activity   Assist Walk 150 feet activity did not occur: Safety/medical concerns         Walk 10 feet on uneven surface   activity   Assist Walk 10 feet on uneven surfaces activity did not occur: Safety/medical concerns         Wheelchair     Assist Is the patient using a wheelchair?: Yes Type of Wheelchair: Manual    Wheelchair assist level: Dependent - Patient 0%      Wheelchair 50 feet with 2 turns activity    Assist        Assist Level: Dependent - Patient 0%   Wheelchair 150 feet activity     Assist      Assist Level: Dependent - Patient 0%   Blood pressure 137/69, pulse 70, temperature 97.9 F (36.6 C), resp. rate 17, height 5\' 9"  (1.753 m), weight 85.7 kg, SpO2 98 %.  Medical Problem List and Plan: 1.  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  Antithrombotics: -DVT/anticoagulation:  Pharmaceutical: Lovenox             -antiplatelet therapy: Aspirin 81 mg daily and Plavix 75 mg day x3 weeks then aspirin alone.  Plavix can be discontinued after last dose 03/29/2021 3. Pain Management: Tylenol as needed 4. Mood/dementia: Aricept 10 mg nightly, Zoloft 100 mg daily             -antipsychotic agents: N/A 5. Neuropsych: This patient is capable of making decisions on his own behalf. 6. Skin/Wound Care: Routine skin checks 7. Fluids/Electrolytes/Nutrition: Routine in and outs with follow-up chemistries 8.  Diabetes mellitus.  Hemoglobin A1c 8.3.  Semglee just adjusted to 10 units daily.  Check blood sugars before meals and at bedtime             -cbg's poorly controlled at present.  12/10- CBGs 172-301 in last 24 hours- just had Insulin increased- will monitor for trend and change as required.  12/11- per nursing, drinking a lot of juices- will decrease juice usage.  Observe with recent semglee change 9.  Dysphagia.  Dysphagia #3 nectar thick  liquid diet.  Advance per speech therapy  12/10- make sure pt can get ice chips per prior protocol.  10.  CKD stage IIIa.  Baseline creatinine 1.3-1.8.  12/10- last Cr 1.41 down from 1.60 2 days prior- will monitor  12/11- labs in AM 11.  Macrocytic anemia/vitamin B12 deficiency.   -b12 injections x 5 days (thru 12/11)-->oral supplementation -Continue iron supplement for mild iron def anemia 12.  History of prostate cancer with prostatectomy.  Follow-up outpatient. 20.  History of adenocarcinoma left lung with resection 2001.  Follow-up outpatient  12/10- pt usually wheezes per daughter- is chronic for him- s/p lobectomy.   12/11- sounds stable- decreased in LUL-  14.  Hyperlipidemia.  Lipitor 15.  Constipation.  Senokot S2 tabs twice daily, MiraLAX daily.  12/10- last BM was documented 12/9- pt says 12/8- will con't regimen  12/11- 2 good BM's yesterday- con't current regimen    LOS: 2 days A FACE TO FACE EVALUATION WAS PERFORMED  Rumaysa Sabatino 03/14/2021, 12:13 PM

## 2021-03-15 ENCOUNTER — Ambulatory Visit: Payer: Medicare HMO

## 2021-03-15 DIAGNOSIS — I6350 Cerebral infarction due to unspecified occlusion or stenosis of unspecified cerebral artery: Secondary | ICD-10-CM | POA: Diagnosis not present

## 2021-03-15 LAB — CBC WITH DIFFERENTIAL/PLATELET
Abs Immature Granulocytes: 0.37 10*3/uL — ABNORMAL HIGH (ref 0.00–0.07)
Basophils Absolute: 0 10*3/uL (ref 0.0–0.1)
Basophils Relative: 0 %
Eosinophils Absolute: 0.1 10*3/uL (ref 0.0–0.5)
Eosinophils Relative: 2 %
HCT: 36.5 % — ABNORMAL LOW (ref 39.0–52.0)
Hemoglobin: 12.2 g/dL — ABNORMAL LOW (ref 13.0–17.0)
Immature Granulocytes: 7 %
Lymphocytes Relative: 11 %
Lymphs Abs: 0.6 10*3/uL — ABNORMAL LOW (ref 0.7–4.0)
MCH: 33.9 pg (ref 26.0–34.0)
MCHC: 33.4 g/dL (ref 30.0–36.0)
MCV: 101.4 fL — ABNORMAL HIGH (ref 80.0–100.0)
Monocytes Absolute: 1.2 10*3/uL — ABNORMAL HIGH (ref 0.1–1.0)
Monocytes Relative: 22 %
Neutro Abs: 3.2 10*3/uL (ref 1.7–7.7)
Neutrophils Relative %: 58 %
Platelets: 110 10*3/uL — ABNORMAL LOW (ref 150–400)
RBC: 3.6 MIL/uL — ABNORMAL LOW (ref 4.22–5.81)
RDW: 12.4 % (ref 11.5–15.5)
WBC: 5.4 10*3/uL (ref 4.0–10.5)
nRBC: 0 % (ref 0.0–0.2)

## 2021-03-15 LAB — COMPREHENSIVE METABOLIC PANEL
ALT: 19 U/L (ref 0–44)
AST: 16 U/L (ref 15–41)
Albumin: 3.4 g/dL — ABNORMAL LOW (ref 3.5–5.0)
Alkaline Phosphatase: 70 U/L (ref 38–126)
Anion gap: 8 (ref 5–15)
BUN: 25 mg/dL — ABNORMAL HIGH (ref 8–23)
CO2: 27 mmol/L (ref 22–32)
Calcium: 8.7 mg/dL — ABNORMAL LOW (ref 8.9–10.3)
Chloride: 104 mmol/L (ref 98–111)
Creatinine, Ser: 1.43 mg/dL — ABNORMAL HIGH (ref 0.61–1.24)
GFR, Estimated: 47 mL/min — ABNORMAL LOW (ref >60)
Glucose, Bld: 213 mg/dL — ABNORMAL HIGH (ref 70–99)
Potassium: 4 mmol/L (ref 3.5–5.1)
Sodium: 139 mmol/L (ref 135–145)
Total Bilirubin: 0.6 mg/dL (ref 0.3–1.2)
Total Protein: 5.8 g/dL — ABNORMAL LOW (ref 6.5–8.1)

## 2021-03-15 LAB — GLUCOSE, CAPILLARY
Glucose-Capillary: 198 mg/dL — ABNORMAL HIGH (ref 70–99)
Glucose-Capillary: 224 mg/dL — ABNORMAL HIGH (ref 70–99)
Glucose-Capillary: 274 mg/dL — ABNORMAL HIGH (ref 70–99)
Glucose-Capillary: 142 mg/dL — ABNORMAL HIGH (ref 70–99)

## 2021-03-15 NOTE — Progress Notes (Signed)
Occupational Therapy Session Note  Patient Details  Name: [name] MRN: [id number] Date of Birth: 01-30-1931  Today's Date: 03/15/2021 OT Individual Time: 9381-8299 OT Individual Time Calculation (min): 59 min    Short Term Goals: Week 1:  OT Short Term Goal 1 (Week 1): Pt will complete grooming task in stance with min A. OT Short Term Goal 2 (Week 1): Pt will don pants with mod A. OT Short Term Goal 3 (Week 1): Pt will don shirt with min A. OT Short Term Goal 4 (Week 1): Pt will independently state 1 method for therapuetic positiong on LUE with no more than min VCs.  Skilled Therapeutic Interventions/Progress Updates:    Session 1:  ([id number])  Pt in bed with nursing present to start.  He was agreeable to completion of bathing and dressing tasks sit to stand at the sink.  Min assist for supine to sit with mod assist for stand pivot transfer to the wheelchair.  He was able to complete UB bathing at mod assist with LB bathing at mod assist sit to stand.  Min facilitation to cross the LLE over the right knee and maintain overall for washing the left foot or donning LB clothing.  Mod assist for sit to stand as well when washing LB or pulling garments over his hips.  Mod assist needed for integration of the LUE for holding objects such as his soap, deodorant, and toothpaste as well as when applying them as appropriate.  He also needed mod assist for donning a button up shirt following hemi technique.  After discussion, he feels pullover shirts may be easier as well. Therapist assisted with donning TEDs and then he was able to donn his slip on shoes with setup.  Finished session with pt completing oral hygiene with supervision and then pt eating ice per water protocol, which was documented on the wall of the pt's room.  No coughing of difficulty noted.  Finished session with pt in the wheelchair and with the call button and phone in reach.    Session 2:  [id number])  Pt in wheelchair to start  with transport down to the therapy gym for session.  He was able to complete stand pivot transfer with max assist from the wheelchair to the therapy mat.  Began having him work on scooting forward and backwards on the left side with integrated use of the LUE in weightbearing.  He was able to complete scooting with supervision.  Progressed to having him complete functional reach to knee level to pick up foam block therapist was holding and then bring up to the therapy mat beside of him.  Min assist to complete secondary to decreased thumb and digit extension as well as synergy pattern in the arm.  Educated pt on self AAROM exercises for the LUE with pt completing small range of AAROM shoulder flexion to less than 60 degrees to protect the left shoulder.  He was able to then complete elbow flexion/extension as well as wrist and digit/thumb extension for 3-5 reps with mod demonstrational cueing.  Educated pt on not leaning to the right when attempting movements and to avoid hiking the left shoulder with elbow and shoulder flexion.  Had him complete squat pivot transfer back to the wheelchair at mod assist to the left with return to the room.  He was left sitting up with the call button and safety belt in place.  Gave him a washcloth to continue working on AROM exercises, washing the table  with the LUE on the half lap tray.    Therapy Documentation Precautions:  Precautions Precautions: Fall, Other (comment) Precaution Comments: L hemi, posterior bias Restrictions Weight Bearing Restrictions: No  Pain: Pain Assessment Pain Scale: Faces Pain Score: 0-No pain ADL: See Care Tool Section for some details of mobility and selfcare   Therapy/Group: Individual Therapy  Israel Wunder,Ramell OTR/L 03/15/2021, 12:09 PM

## 2021-03-15 NOTE — Progress Notes (Signed)
Speech Language Pathology Daily Session Note  Patient Details  Name: [name] MRN: [id number] Date of Birth: 1930/06/21  Today's Date: 03/15/2021 SLP Individual Time: 5521-7471 SLP Individual Time Calculation (min): 47 min  Short Term Goals: Week 1: SLP Short Term Goal 1 (Week 1): Pt will consume therapeutic trials of thin liquids with minimal overt s/s of aspiration and mod I use of swallowing precautions over 2 consecutive sessions prior to repeat instrumental assessment. SLP Short Term Goal 2 (Week 1): Pt will utilize increased vocal intensity and overarticulation to achieve intelligibility at the conversational level with mod I  Skilled Therapeutic Interventions: Pt seen for skilled ST with focus on speech and swallow goals, pt upright in wheelchair and ready for therapeutic tasks. Pt able to recall 3/3 speech intelligibility strategies from evaluation, benefits from Supervision A cues to utilize at conversation level. Pt states he spoke with wife on phone this AM and she had no difficulty understanding him which he was pleased with. SLP facilitating cup sips thin liquid by providing Supervision A cues for use of swallow precautions. Pt reports no dislike of nectar thick liquids, educated on goal for upgrade to thins which he is agreeable to. Pt with no overt s/s aspiration with cup sips thin this date, no change in vocal quality. Pt encouraged to ask staff for ice chips between meals via Water (Ice) Protocol. Pt left upright in wheelchair with alarm belt present and all needs within reach. Cont ST POC.   Pain Pain Assessment Pain Scale: 0-10 Pain Score: 0-No pain  Therapy/Group: Individual Therapy  Dewaine Conger 03/15/2021, 2:47 PM

## 2021-03-15 NOTE — Progress Notes (Signed)
Patient ID: [name], male   DOB: [birth year], 87 y.o.   MRN: [id number] Met with the patient to introduce self and role. Reviewed medical status, rehab routine, team conference and plan of care. Discussed medication management, dietary modification recommendations and nectar thickened liquids. Patient noted caregiver sets up pill box, wife manages cooking and he manages insulin administration. Reviewed secondary risks including HTn, HLD, (LDL 83), DM (A1C 8.3). Encouraged drinking H2O or coffee as at home instead of juices. Discussed options for increasing protein in diet as well. Continue to follow along to discharge to address educational needs and facilitate preparation for discharge. Margarito Liner

## 2021-03-15 NOTE — Plan of Care (Signed)
  Problem: RH BOWEL ELIMINATION Goal: RH STG MANAGE BOWEL WITH ASSISTANCE Description: STG Manage Bowel with mod I Assistance. Outcome: Not Progressing; due laxatives given LBM 12/10

## 2021-03-15 NOTE — Progress Notes (Signed)
PROGRESS NOTE   Subjective/Complaints:  No issues overnite  ROS:  Pt denies increased SOB, abd pain, CP, N/V/C/D, and vision changes   Objective:   No results found. Recent Labs    03/15/21 0538  WBC 5.4  HGB 12.2*  HCT 36.5*  PLT 110*   Recent Labs    03/15/21 0538  NA 139  K 4.0  CL 104  CO2 27  GLUCOSE 213*  BUN 25*  CREATININE 1.43*  CALCIUM 8.7*     Intake/Output Summary (Last 24 hours) at 03/15/2021 2992 Last data filed at 03/14/2021 2300 Gross per 24 hour  Intake 720 ml  Output 100 ml  Net 620 ml         Physical Exam: Vital Signs Blood pressure (!) 143/69, pulse 76, temperature 97.6 F (36.4 C), temperature source Oral, resp. rate 16, height 5\' 9"  (1.753 m), weight 84.7 kg, SpO2 97 %.    General: No acute distress Mood and affect are appropriate Heart: Regular rate and rhythm no rubs murmurs or extra sounds Lungs: Clear to auscultation, breathing unlabored, no rales or wheezes Abdomen: Positive bowel sounds, soft nontender to palpation, nondistended Extremities: No clubbing, cyanosis, or edema Skin: No evidence of breakdown, no evidence of rash  Musculoskeletal:     Cervical back: Normal range of motion and neck supple.     Comments: Full ROM, No pain with AROM or PROM in the neck, trunk, or extremities. Posture appropriate   Skin:    General: Skin is warm and dry.  Neurological:     Mental Status: He is alert.     Comments: Patient is alert.  Makes eye contact with examiner.  He was able to provide his name place, month, year, why he's here.  Follows basic commands. Left central 7 and tongue deviation. LUE 2/5 deltoid, 3/5 bicep, 2+ tricep, 1+ to 2- wrist/fingers. LLE 2+ to 3/5. RUE and RLE 4 to 4+/5 prox to distal. Sensory exam normal for light touch and pain in all 4 limbs. No limb ataxia or cerebellar signs. No abnormal tone appreciated.  No abnormal tone   Assessment/Plan: 1.  Functional deficits which require 3+ hours per day of interdisciplinary therapy in a comprehensive inpatient rehab setting. Physiatrist is providing close team supervision and 24 hour management of active medical problems listed below. Physiatrist and rehab team continue to assess barriers to discharge/monitor patient progress toward functional and medical goals  Care Tool:  Bathing    Body parts bathed by patient: Left lower leg, Face, Left arm, Chest, Abdomen, Right upper leg, Buttocks, Front perineal area, Left upper leg, Right lower leg   Body parts bathed by helper: Buttocks, Right arm     Bathing assist Assist Level: Moderate Assistance - Patient 50 - 74%     Upper Body Dressing/Undressing Upper body dressing   What is the patient wearing?: Button up shirt, Hospital gown only    Upper body assist Assist Level: Moderate Assistance - Patient 50 - 74%    Lower Body Dressing/Undressing Lower body dressing      What is the patient wearing?: Underwear/pull up, Pants     Lower body assist Assist for lower  body dressing: Moderate Assistance - Patient 50 - 74%     Toileting Toileting    Toileting assist Assist for toileting: Dependent - Patient 0% (w/ stedy)     Transfers Chair/bed transfer  Transfers assist     Chair/bed transfer assist level: Moderate Assistance - Patient 50 - 74%     Locomotion Ambulation   Ambulation assist   Ambulation activity did not occur: Safety/medical concerns          Walk 10 feet activity   Assist  Walk 10 feet activity did not occur: Safety/medical concerns        Walk 50 feet activity   Assist Walk 50 feet with 2 turns activity did not occur: Safety/medical concerns         Walk 150 feet activity   Assist Walk 150 feet activity did not occur: Safety/medical concerns         Walk 10 feet on uneven surface  activity   Assist Walk 10 feet on uneven surfaces activity did not occur: Safety/medical  concerns         Wheelchair     Assist Is the patient using a wheelchair?: Yes Type of Wheelchair: Manual    Wheelchair assist level: Dependent - Patient 0%      Wheelchair 50 feet with 2 turns activity    Assist        Assist Level: Dependent - Patient 0%   Wheelchair 150 feet activity     Assist      Assist Level: Dependent - Patient 0%   Blood pressure (!) 143/69, pulse 76, temperature 97.6 F (36.4 C), temperature source Oral, resp. rate 16, height 5\' 9"  (1.753 m), weight 84.7 kg, SpO2 97 %.  Medical Problem List and Plan: 1.  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  Antithrombotics: -DVT/anticoagulation:  Pharmaceutical: Lovenox             -antiplatelet therapy: Aspirin 81 mg daily and Plavix 75 mg day x3 weeks then aspirin alone.  Plavix can be discontinued after last dose 03/29/2021 3. Pain Management: Tylenol as needed 4. Mood/dementia: Aricept 10 mg nightly, Zoloft 100 mg daily             -antipsychotic agents: N/A 5. Neuropsych: This patient is capable of making decisions on his own behalf. 6. Skin/Wound Care: Routine skin checks 7. Fluids/Electrolytes/Nutrition: Routine in and outs with follow-up chemistries 8.  Diabetes mellitus.  Hemoglobin A1c 8.3.  Semglee just adjusted to 10 units daily.  Check blood sugars before meals and at bedtime             -cbg's poorly controlled at present.  12/10- CBGs 172-301 in last 24 hours- just had Insulin increased- will monitor for trend and change as required.  12/11- per nursing, drinking a lot of juices- will decrease juice usage.  Observe with recent semglee change 9.  Dysphagia.  Dysphagia #3 nectar thick liquid diet.  Advance per speech therapy  12/10- make sure pt can  get ice chips per prior protocol.  10.  CKD stage IIIa.  Baseline creatinine 1.3-1.8.  12/10- last Cr 1.41 down from 1.60 2 days prior- will  monitor   BMP Latest Ref Rng & Units 03/15/2021 03/11/2021 03/10/2021  Glucose 70 - 99 mg/dL 213(H) 170(H) 186(H)  BUN 8 - 23 mg/dL 25(H) 22 23  Creatinine 0.61 - 1.24 mg/dL 1.43(H) 1.41(H) 1.54(H)  Sodium 135 - 145 mmol/L 139 135 138  Potassium 3.5 - 5.1 mmol/L 4.0 4.4 4.7  Chloride 98 - 111 mmol/L 104 105 104  CO2 22 - 32 mmol/L 27 25 24   Calcium 8.9 - 10.3 mg/dL 8.7(L) 8.7(L) 8.6(L)   03/15/21 stable  11.  Macrocytic anemia/vitamin B12 deficiency.   -b12 injections x 5 days (thru 12/11)-->oral supplementation -Continue iron supplement for mild iron def anemia 12.  History of prostate cancer with prostatectomy.  Follow-up outpatient. 17.  History of adenocarcinoma left lung with resection 2001.  Follow-up outpatient  12/10- pt usually wheezes per daughter- is chronic for him- s/p lobectomy.   12/11- sounds stable- decreased in LUL-  14.  Hyperlipidemia.  Lipitor 15.  Constipation.  Senokot S2 tabs twice daily, MiraLAX daily.  12/10- last BM was documented 12/9- pt says 12/8- will con't regimen  12/11- 2 good BM's yesterday- con't current regimen   16.  Mild thrombocytopenia- stable no signs of hemorrhage , monitor while on enoxaparin  CBC Latest Ref Rng & Units 03/15/2021 03/10/2021 03/09/2021  WBC 4.0 - 10.5 K/uL 5.4 6.6 6.6  Hemoglobin 13.0 - 17.0 g/dL 12.2(L) 12.3(L) 11.7(L)  Hematocrit 39.0 - 52.0 % 36.5(L) 36.4(L) 34.9(L)  Platelets 150 - 400 K/uL 110(L) 113(L) 117(L)    LOS: 3 days A FACE TO FACE EVALUATION WAS PERFORMED  Charlett Blake 03/15/2021, 9:28 AM

## 2021-03-15 NOTE — Progress Notes (Signed)
Physical Therapy Session Note  Patient Details  Name: [name] MRN: [id number] Date of Birth: June 23, 1930  Today's Date: 03/15/2021 PT Individual Time: 1115-1200 PT Individual Time Calculation (min): 45 min   Short Term Goals: Week 1:  PT Short Term Goal 1 (Week 1): Pt will perform supine<>sit with min assist PT Short Term Goal 2 (Week 1): Pt will perform sit<>stand with min assist using LRAD PT Short Term Goal 3 (Week 1): Pt will perform bed<>chair transfer using LRAD with min assist PT Short Term Goal 4 (Week 1): Pt will ambulate at least 19ft using LRAD with mod assist of 1 PT Short Term Goal 5 (Week 1): Pt will demonstrate improvement in Berg Balance Test by at least 7 points  Skilled Therapeutic Interventions/Progress Updates:    Patient received reclined in bed, agreeable to PT. He denies pain. Patient able to come sit edge of bed with MinA and use of bed features. Stedy used to transfer to [employer] for efficiency with CGA to stand. PT transporting patient in wc to therapy gym for time management and energy conservation. MinA to stand with RW + hand grip, marching in place with MinA. Patient ambulating 2x10 ft with RW + MinA. Scissoring gait with foot flat contact L >R with compensatory use of L adductors to advance L LE through swing phase, L genu recurtavum in terminal stance. Added 2# ankle weight and patient ambulated 2x42ft with RW + MinA with decreased evidence of scissoring gait. PT adding Sprystep AFO to L LE to assist with minimizing L knee hyperextension and improving heel contact L LE with good improvement. Patient ambulating 2x13ft with RW and MinA with overall improvement in gait mechanics noted. Patient remaining up in wc, seatbelt alarm on, call light within reach.   Therapy Documentation Precautions:  Precautions Precautions: Fall, Other (comment) Precaution Comments: L hemi, posterior bias Restrictions Weight Bearing Restrictions: No    Therapy/Group: Individual  Therapy  Karoline Caldwell, PT, DPT, CBIS  03/15/2021, 7:33 AM

## 2021-03-15 NOTE — Progress Notes (Signed)
Inpatient Rehabilitation  Patient information reviewed and entered into eRehab system by Maribeth Jiles Martinez Boxx, OTR/L.   Information including medical coding, functional ability and quality indicators will be reviewed and updated through discharge.    

## 2021-03-15 NOTE — Progress Notes (Signed)
[place] Individual Statement of Services  Patient Name:  [name]  Date:  03/15/2021  Welcome to the [facility].  Our goal is to provide you with an individualized program based on your diagnosis and situation, designed to meet your specific needs.  With this comprehensive rehabilitation program, you will be expected to participate in at least 3 hours of rehabilitation therapies Monday-Friday, with modified therapy programming on the weekends.  Your rehabilitation program will include the following services:  Physical Therapy (PT), Occupational Therapy (OT), Speech Therapy (ST), 24 hour per day rehabilitation nursing, Therapeutic Recreaction (TR), Neuropsychology, Care Coordinator, Rehabilitation Medicine, Nutrition Services, Pharmacy Services, and Other  Weekly team conferences will be held on Wednesdays to discuss your progress.  Your Inpatient Rehabilitation Care Coordinator will talk with you frequently to get your input and to update you on team discussions.  Team conferences with you and your family in attendance may also be held.  Expected length of stay: 10-12 Days  Overall anticipated outcome:  Supervision to Min A  Depending on your progress and recovery, your program may change. Your Inpatient Rehabilitation Care Coordinator will coordinate services and will keep you informed of any changes. Your Inpatient Rehabilitation Care Coordinator's name and contact numbers are listed  below.  The following services may also be recommended but are not provided by the [facility]:   [place] will be made to provide these services after discharge if needed.  Arrangements include referral to agencies that provide these services.  Your insurance has been verified to be:  [employer] Your primary doctor is:  [name], MD  Pertinent  information will be shared with your doctor and your insurance company.  Inpatient Rehabilitation Care Coordinator:  Erlene Quan, [place] or [phone]  Information discussed with and copy given to patient by: Dyanne Iha, 03/15/2021, 12:01 PM

## 2021-03-15 NOTE — Progress Notes (Signed)
Inpatient Rehabilitation Care Coordinator Assessment and Plan Patient Details  Name: [name] MRN: [id number] Date of Birth: October 03, 1930  Today's Date: 03/15/2021  Hospital Problems: Principal Problem:   Right pontine cerebrovascular accident [facility])  Past Medical History:  Past Medical History:  Diagnosis Date   Adenocarcinoma of left lung, stage 1 ([place]) 2001   T1N0 stage I  adenoca left lung resected 01/03/00    Alzheimer's disease ([place])    CAD (coronary artery disease) 2014   a. 09/2012 Cath: LM nl, LAD 50-60p, D1 60-70 m, D1 50ost, LCX nl, RCA min irregs, EF 55-65%.   Generalized anxiety disorder 01/08/2007   Qualifier: Diagnosis of  By: Diona Browner MD, [name]     History of colonic polyps 07/25/2011   Macular degeneration of both eyes 01/21/2014   Nonmelanoma skin cancer 07/25/2011   Multiple lesions excised face/nose   Pericarditis    a. 09/2012 with effusion and tamponade, s/p window.  b.  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 SCC IN SITU TX CX3   SCC (squamous cell carcinoma) 04/12/2005   RIGHT NECK SUP. SCC IN SITU TX EXC   SCC (squamous cell carcinoma) 04/16/2013   LEFT TEMPLE SCC IN SITU TX CX3 5FU   SCC (squamous cell carcinoma) 04/16/2013   BELOW LEFT EYE SCC IN SITU TX CX3 5FU   SCC (squamous cell carcinoma) 04/16/2013   BELOW RIGHT EYE SCC IN SITU TX  CX3 5FU   SCC (squamous cell carcinoma) 08/04/2014   LEFT CHEEK SCC IN SITU TX CX3 5FU   SCC (squamous cell carcinoma) 11/27/2018   LEFT TEMPLE SCC IN SITU TX WITH BX   SCC (squamous cell carcinoma)    SCC (squamous cell carcinoma)    SCC (squamous cell carcinoma)    SCC (squamous cell carcinoma) Well Diff 09/13/2016   Tip of Nose SCC WELL DIFF TX (MOH's), and RIGHT INNER EYE SUP. SCC IN SITU TX TO WATCH   SCC (squamous cell carcinoma) Well Diff 05/30/2017   Right Cheekbone (Cx3,5FU) and Under Left Eye (Cx3,5FU)   Squamous cell carcinoma in situ (SCCIS) 04/12/2005   Right Neck Inf (Cx3), Right Neck Sup (Exc), and Bulb of Nose (Cx3,Exc)   Squamous cell carcinoma in situ (SCCIS) 09/27/2005   Nose (Cx3,Exc)   Squamous cell carcinoma in situ (SCCIS) 02/18/2013   Below Left Eye (tx p bx)   Squamous cell carcinoma in situ (SCCIS) 04/16/2013   Left Temple (Cx3,5FU), Below Left Eye (Cx3,5FU), Below Right Eye (Cx3,5FU)   Squamous cell carcinoma in situ (SCCIS) 08/04/2014   Left Cheek (Cx3,5FU)   Squamous cell carcinoma in situ (SCCIS) 09/13/2016   Right Inner Eye Sup. 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 10/25/2006   Right Elbow (Cx3,5FU)   Squamous cell carcinoma of skin 09/27/2005   NOSE SCC IN SITU TC CX3 5FU   Tobacco abuse, 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/08/2013   Type 2 diabetes mellitus with vascular disease ([place]) 05/21/2007   Qualifier: Diagnosis of  By: Diona Browner MD, [name]     Type 2 DM with CKD stage 3 and hypertension ([place]) 07/16/2015   Unspecified essential hypertension    Past Surgical History:  Past Surgical History:  Procedure Laterality Date   HERNIA REPAIR     LEFT HEART CATHETERIZATION WITH CORONARY ANGIOGRAM N/A 09/13/2012   Procedure: LEFT HEART CATHETERIZATION WITH CORONARY ANGIOGRAM;  Surgeon: Sherren Mocha, MD;  [facility] LAB;   Service: Cardiovascular;  Laterality: N/A;   LOBECTOMY  2001   upper left   PERICARDIAL TAP N/A 09/16/2012   Procedure: PERICARDIAL TAP;  Surgeon: Sinclair Grooms, MD;  Location: [facility] CATH LAB;  Service: Cardiovascular;  Laterality: N/A;   PROSTATECTOMY  2003   SUBXYPHOID PERICARDIAL WINDOW N/A 09/16/2012   Procedure: SUBXYPHOID PERICARDIAL WINDOW;  Surgeon: Rexene Alberts, MD;  Location: [facility];  Service: Thoracic;  Laterality: N/A;   TONSILLECTOMY     Social History:  reports that he quit smoking about 36 years ago. His smoking use included cigarettes. He started smoking about 68 years ago. He has a 84.00 pack-year smoking history. He quit smokeless tobacco use about 34 years ago.  His smokeless tobacco use included chew. He reports that he does not drink alcohol and does not use drugs.  Family / Support Systems Patient Roles: Spouse Spouse/Significant Other: [name] Children: Investment banker, corporate (Daughter), Tammy (Daughter), Excell (Son) Other Supports: [place] (live in care giver for spouse) Anticipated Caregiver: Juanita to assit at night, children to assist during the day Ability/Limitations of Caregiver: none Caregiver Availability: 24/7 Family Dynamics: spouse, daughters, sons, caregiver support  Social History Preferred language: English Religion: [employer] - How often do you need to have someone help you when you read instructions, pamphlets, or other written material from your doctor or pharmacy?: Always Employment Status: Retired Public relations account executive Issues: N/a Guardian/Conservator: Tammy   Abuse/Neglect Abuse/Neglect Assessment Can Be Completed: Unable to assess, patient is non-responsive or altered mental status Physical Abuse: Denies Verbal Abuse: Denies Sexual Abuse: Denies Exploitation of patient/patient's resources: Denies Self-Neglect: Denies  Patient response to: Social Isolation - How often do you feel lonely or isolated from those around you?: Patient  unable to respond  Emotional Status Recent Psychosocial Issues: n/a Psychiatric History: n/a Substance Abuse History: n/a  Patient / Family Perceptions, Expectations & Goals Pt/Family understanding of illness & functional limitations: yes Premorbid pt/family roles/activities: MOD I with AD as needed. Require cogntive assist Anticipated changes in roles/activities/participation: caregiver and family able to assist Pt/family expectations/goals: Suspervision to [employer] Premorbid Home Care/DME Agencies: Other (Comment) [facility] Bed, Civil engineer, contracting) Transportation available at discharge: family able to transport Is the patient able to respond to transportation needs?: Yes In the past 12 months, has lack of transportation kept you from medical appointments or from getting medications?: No In the past 12 months, has lack of transportation kept you from meetings, work, or from getting things needed for daily living?: No  Discharge Planning Living Arrangements: Spouse/significant other, Other relatives Support Systems: Spouse/significant other, Children, Home care staff Type of Residence: Private residence Insurance Resources: Private  Insurance (specify) Doctor, general practice) Financial Resources: SSD, Radio broadcast assistant Screen Referred: No Living Expenses: Lives with family Money Management: Patient, Spouse Does the patient have any problems obtaining your medications?: No Home Management: Patient recives assistance with money and meds Patient/Family Preliminary Plans: family to continue Care Coordinator Barriers to Discharge: Insurance for SNF coverage Care Coordinator Anticipated Follow Up Needs: HH/OP Expected length of stay: 10-12 Days  Clinical Impression SW spoke with pt spouse. Spouse will be patients primary contact. Patient to d/c home with spouse, children and home care staff to provide assistance. No additional questions or concerns. SW will cont to follow  up.  Dyanne Iha 03/15/2021, 1:12 PM

## 2021-03-15 NOTE — IPOC Note (Signed)
Overall Plan of Care [facility]) Patient Details Name: [name] MRN: [id number] DOB: 05-Apr-1930  Admitting Diagnosis: Right pontine cerebrovascular accident [facility])  Hospital Problems: Principal Problem:   Right pontine cerebrovascular accident [facility])     Functional Problem List: Nursing Medication Management, Safety, Endurance, Bowel  PT Balance, Perception, Behavior, Safety, Edema, Sensory, Endurance, Skin Integrity, Motor, Nutrition, Pain  OT Balance, Skin Integrity, Endurance, Motor, Safety, Perception, Vision  SLP Linguistic, Motor  TR         Basic ADL's: OT Eating, Grooming, Bathing, Dressing, Toileting     Advanced  ADL's: OT       Transfers: PT Bed Mobility, Bed to Chair, Car, Manufacturing systems engineer, Metallurgist: PT Ambulation, Emergency planning/management officer, Stairs     Additional Impairments: OT Fuctional Use of Upper Extremity  SLP Swallowing, Communication expression    TR      Anticipated Outcomes Item Anticipated Outcome  Self Feeding mod I  Swallowing  mod I   Basic self-care  S  Toileting  S   Bathroom Transfers CGA  Bowel/Bladder  manage bowel w mod I assist  Transfers  CGA using LRAD  Locomotion  CGA using LRAD  Communication  mod I  Cognition     Pain  n/a  Safety/Judgment  manage w cues/reminders   Therapy Plan: PT Intensity: Minimum of 1-2 x/day ,45 to 90 minutes PT Frequency: 5 out of 7 days PT Duration Estimated Length of Stay: 3-3.5 weeks OT Intensity: Minimum of 1-2 x/day, 45 to 90 minutes OT Frequency: 5 out of 7 days OT Duration/Estimated Length of Stay: 3-3.5 weeks SLP Intensity: Minumum of 1-2 x/day, 30 to 90 minutes SLP Frequency: 1 to 3 out of 7 days SLP Duration/Estimated Length of Stay: 3-3.5 weeks   Due to the current state of emergency, patients may not be receiving their 3-hours of Medicare-mandated therapy.   Team Interventions: Nursing Interventions Disease Management/Prevention, Medication  Management, Discharge Planning, Bowel Management, Patient/Family Education, Dysphagia/Aspiration Precaution Training  PT interventions Ambulation/gait training, Community reintegration, DME/adaptive equipment instruction, Neuromuscular re-education, Psychosocial support, Stair training, UE/LE Strength taining/ROM, Wheelchair propulsion/positioning, Training and development officer, Discharge planning, Functional electrical stimulation, Pain management, Skin care/wound management, Therapeutic Activities, UE/LE Coordination activities, Cognitive remediation/compensation, Disease management/prevention, Functional mobility training, Patient/family education, Splinting/orthotics, Therapeutic Exercise, Visual/perceptual remediation/compensation  OT Interventions Balance/vestibular training, Disease mangement/prevention, Neuromuscular re-education, Self Care/advanced ADL retraining, Therapeutic Exercise, Wheelchair propulsion/positioning, UE/LE Strength taining/ROM, Skin care/wound managment, Pain management, DME/adaptive equipment instruction, Cognitive remediation/compensation, Community reintegration, Functional electrical stimulation, Patient/family education, Splinting/orthotics, UE/LE Coordination activities, Visual/perceptual remediation/compensation, Therapeutic Activities, Psychosocial support, Functional mobility training, Discharge planning  SLP Interventions Cueing hierarchy, Dysphagia/aspiration precaution training, Internal/external aids, Speech/Language facilitation, Patient/family education, Functional tasks  TR Interventions    SW/CM Interventions     Barriers to Discharge MD  Medical stability  Nursing Decreased caregiver support 1 level ramped entry w spouse(has live in caregiver), daughter assists during the day  PT Nutrition means    OT Inaccessible home environment, Home environment Child psychotherapist, Insurance underwriter for SNF coverage    SLP      SW       Team Discharge Planning: Destination:  PT-Home ,OT- Home , SLP-Home Projected Follow-up: PT-Home health PT, 24 hour supervision/assistance, OT-  Home health OT, Outpatient OT, SLP-Other (comment) (to be determined) Projected Equipment Needs: PT-To be determined, OT- To be determined, SLP-To be determined Equipment Details: PT- , OT-  Patient/family involved in discharge planning: PT- Patient, Family member/caregiver,  OT-Patient, Family member/caregiver,  SLP-Patient, Family member/caregiver  MD ELOS: 10-12d Medical Rehab Prognosis:  Good Assessment: 87 year old right-handed male with history of Alzheimer's dementia maintained on Aricept, type 2 diabetes mellitus, hypertension, CKD stage III, macular degeneration, adenocarcinoma of left lung with resection 2001, prostate cancer with prostatectom 2003 y, CAD, quit smoking 36 years ago..  Per chart review patient lives with spouse as well as a live-in caregiver who assist with meal preparation and ADLs.  Patient using rolling walker prior to admission.  1 level home with ramped entrance.  He also has good outside extended family.  Presented 03/07/2021 with left-sided weakness x2 to 3 days.  Cranial CT scan negative.  MRI identified small foci of acute ischemia within the ventral right pons and left frontal periventricular white matter.  No hemorrhage or mass-effect.  MRA with no intracranial large vessel occlusion.  Patient did not receive tPA.  Admission chemistries unremarkable except potassium 5.2 glucose 229 creatinine 1.50, alcohol negative, hemoglobin A1c 8.3, urinalysis negative nitrite.  Echocardiogram with ejection fraction of 50 to 55% no wall motion abnormalities grade 1 diastolic dysfunction.  Neurology follow-up maintained on aspirin 81 mg daily and Plavix 75 mg daily for CVA prophylaxis x3 weeks then aspirin alone.  Lovenox for DVT prophylaxis.  Dysphagia #3 nectar thick liquid diet    See Team Conference Notes for weekly updates to the plan of care

## 2021-03-16 DIAGNOSIS — I6350 Cerebral infarction due to unspecified occlusion or stenosis of unspecified cerebral artery: Secondary | ICD-10-CM | POA: Diagnosis not present

## 2021-03-16 LAB — GLUCOSE, CAPILLARY
Glucose-Capillary: 182 mg/dL — ABNORMAL HIGH (ref 70–99)
Glucose-Capillary: 220 mg/dL — ABNORMAL HIGH (ref 70–99)
Glucose-Capillary: 206 mg/dL — ABNORMAL HIGH (ref 70–99)
Glucose-Capillary: 226 mg/dL — ABNORMAL HIGH (ref 70–99)

## 2021-03-16 NOTE — Progress Notes (Signed)
Speech Language Pathology Daily Session Note  Patient Details  Name: [name] MRN: [id number] Date of Birth: 05-Feb-1931  Today's Date: 03/16/2021 SLP Individual Time: 7867-5449 SLP Individual Time Calculation (min): 42 min  Short Term Goals: Week 1: SLP Short Term Goal 1 (Week 1): Pt will consume therapeutic trials of thin liquids with minimal overt s/s of aspiration and mod I use of swallowing precautions over 2 consecutive sessions prior to repeat instrumental assessment. SLP Short Term Goal 2 (Week 1): Pt will utilize increased vocal intensity and overarticulation to achieve intelligibility at the conversational level with mod I  Skilled Therapeutic Interventions: Skilled ST treatment focused on swallowing and speech goals. Pt completed oral care with set-up assist prior to therapeutic PO trials. Patient consumed ice chips and single cup sips of thin liquids (water) without overt s/sx of aspiration and clear vocal quality post swallows with sup A verbal cues for adherence of swallow precautions including modifying positioning in chair, keeping head in neutral position. Continue water protocol with ice chips only. Patient recalled and implemented speech intelligibility strategies with modified independence at conversation level to achieve 100% intelligibility. Patient was left in recliner with alarm activated and immediate needs within reach at end of session. Continue per current plan of care.       Pain Pain Assessment Pain Scale: 0-10  Therapy/Group: Individual Therapy  Patty Sermons 03/16/2021, 4:17 PM

## 2021-03-16 NOTE — Progress Notes (Signed)
Occupational Therapy Session Note  Patient Details  Name: [name] MRN: [id number] Date of Birth: [birth year]  Today's Date: 03/16/2021 OT Individual Time: 5631-4970 OT Individual Time Calculation (min): 62 min    Short Term Goals: Week 1:  OT Short Term Goal 1 (Week 1): Pt will complete grooming task in stance with min A. OT Short Term Goal 2 (Week 1): Pt will don pants with mod A. OT Short Term Goal 3 (Week 1): Pt will don shirt with min A. OT Short Term Goal 4 (Week 1): Pt will independently state 1 method for therapuetic positiong on LUE with no more than min VCs. Week 2:    Week 3:     Skilled Therapeutic Interventions/Progress Updates:    The pt was seated at w/c LOF upon arrival.  The pt completed sit to stand with his LLE and ankle orthotic device in place,with ModA, he was able to maintain static standing balance for a count of 10 x3 with rest breaks as needed.  The pt was able to come from sit to stand at the sink with [place] for initiation while weight bearing through  BUE and BLE by shifting his weight to improve communication to the extremities for gains in active engagement.as a prime mover or assist.  The pt was able to stand at the sink and wash bilateral hands, face, brush his teeth, and comb his hair with ModA incorporating bilateral UE,with the LUE requiring hand over hand assistance during active performance. The pt returned to the w/c with ModA and complete a resistive exercise while grasping a 2lb dowel directed in various planes for opposition. The pt used a  2lb dumb bell for LUE strengthening for bicep curls, shld flexion, and horizontal abduction.  The exercise was guided , but permitted opportunity for active participation. The pt returned to the recliner with hsi bedside table , alarm and call light in reach, all additional needs were addressed. The patient had no c/o pain at the time of treatment.     Therapy Documentation Precautions:   Precautions Precautions: Fall, Other (comment) Precaution Comments: L hemi, posterior bias Restrictions Weight Bearing Restrictions: No General:   Vital Signs:  Pain: Pain Assessment Pain Scale: Faces Pain Score: 0-No pain ADL: Vision   Perception    Praxis   Balance   Exercises:   Other Treatments:     Therapy/Group: Individual Therapy  Yvonne Kendall 03/16/2021, 1:30 PM

## 2021-03-16 NOTE — Progress Notes (Signed)
PROGRESS NOTE   Subjective/Complaints:   No issues overnite, Pt without pain, had BM  recently   ROS:  Pt denies increased SOB, abd pain, CP, N/V/C/D, and vision changes   Objective:   No results found. Recent Labs    03/15/21 0538  WBC 5.4  HGB 12.2*  HCT 36.5*  PLT 110*    Recent Labs    03/15/21 0538  NA 139  K 4.0  CL 104  CO2 27  GLUCOSE 213*  BUN 25*  CREATININE 1.43*  CALCIUM 8.7*      Intake/Output Summary (Last 24 hours) at 03/16/2021 0920 Last data filed at 03/15/2021 2300 Gross per 24 hour  Intake 360 ml  Output --  Net 360 ml         Physical Exam: Vital Signs Blood pressure (!) 150/69, pulse 66, temperature (!) 97.3 F (36.3 C), resp. rate 16, height 5\' 9"  (1.753 m), weight 81.5 kg, SpO2 95 %.    General: No acute distress Mood and affect are appropriate Heart: Regular rate and rhythm no rubs murmurs or extra sounds Lungs: Clear to auscultation, breathing unlabored, no rales or wheezes Abdomen: Positive bowel sounds, soft nontender to palpation, nondistended Extremities: No clubbing, cyanosis, or edema Skin: No evidence of breakdown, no evidence of rash  Musculoskeletal:     Cervical back: Normal range of motion and neck supple.     Comments: Full ROM, No pain with AROM or PROM in the neck, trunk, or extremities. Posture appropriate   Skin:    General: Skin is warm and dry.  Neurological:     Mental Status: He is alert.     Comments: Patient is alert.  Makes eye contact with examiner.  He was able to provide his name place, month, year, why he's here.  Follows basic commands. Left central 7 and tongue deviation. LUE 2/5 deltoid, 3/5 bicep, 2+ tricep, 1+ to 2- wrist/fingers. LLE 2+ to 3/5. RUE and RLE 4 to 4+/5 prox to distal. Sensory exam normal for light touch and pain in all 4 limbs. No limb ataxia or cerebellar signs. No abnormal tone appreciated.  No abnormal tone    Assessment/Plan: 1. Functional deficits which require 3+ hours per day of interdisciplinary therapy in a comprehensive inpatient rehab setting. Physiatrist is providing close team supervision and 24 hour management of active medical problems listed below. Physiatrist and rehab team continue to assess barriers to discharge/monitor patient progress toward functional and medical goals  Care Tool:  Bathing    Body parts bathed by patient: Left lower leg, Face, Left arm, Chest, Abdomen, Right upper leg, Buttocks, Front perineal area, Left upper leg, Right lower leg   Body parts bathed by helper: Buttocks, Right arm     Bathing assist Assist Level: Moderate Assistance - Patient 50 - 74%     Upper Body Dressing/Undressing Upper body dressing   What is the patient wearing?: Button up shirt, Hospital gown only    Upper body assist Assist Level: Moderate Assistance - Patient 50 - 74%    Lower Body Dressing/Undressing Lower body dressing      What is the patient wearing?: Underwear/pull up, Pants  Lower body assist Assist for lower body dressing: Moderate Assistance - Patient 50 - 74%     Toileting Toileting    Toileting assist Assist for toileting: Dependent - Patient 0% (w/ stedy)     Transfers Chair/bed transfer  Transfers assist     Chair/bed transfer assist level: Moderate Assistance - Patient 50 - 74%     Locomotion Ambulation   Ambulation assist   Ambulation activity did not occur: Safety/medical concerns          Walk 10 feet activity   Assist  Walk 10 feet activity did not occur: Safety/medical concerns        Walk 50 feet activity   Assist Walk 50 feet with 2 turns activity did not occur: Safety/medical concerns         Walk 150 feet activity   Assist Walk 150 feet activity did not occur: Safety/medical concerns         Walk 10 feet on uneven surface  activity   Assist Walk 10 feet on uneven surfaces activity did not  occur: Safety/medical concerns         Wheelchair     Assist Is the patient using a wheelchair?: Yes Type of Wheelchair: Manual    Wheelchair assist level: Dependent - Patient 0%      Wheelchair 50 feet with 2 turns activity    Assist        Assist Level: Dependent - Patient 0%   Wheelchair 150 feet activity     Assist      Assist Level: Dependent - Patient 0%   Blood pressure (!) 150/69, pulse 66, temperature (!) 97.3 F (36.3 C), resp. rate 16, height 5\' 9"  (1.753 m), weight 81.5 kg, SpO2 95 %.  Medical Problem List and Plan: 1.  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  Antithrombotics: -DVT/anticoagulation:  Pharmaceutical: Lovenox             -antiplatelet therapy: Aspirin 81 mg daily and Plavix 75 mg day x3 weeks then aspirin alone.  Plavix can be discontinued after last dose 03/29/2021 3. Pain Management: Tylenol as needed 4. Mood/dementia: Aricept 10 mg nightly, Zoloft 100 mg daily             -antipsychotic agents: N/A 5. Neuropsych: This patient is capable of making decisions on his own behalf. 6. Skin/Wound Care: Routine skin checks 7. Fluids/Electrolytes/Nutrition: Routine in and outs with follow-up chemistries 8.  Diabetes mellitus.  Hemoglobin A1c 8.3.  Semglee just adjusted to 10 units daily.  Check blood sugars before meals and at bedtime             -cbg's poorly controlled at present.  CBG (last 3)  Recent Labs    03/15/21 1633 03/15/21 2122 03/16/21 0629  GLUCAP 274* 142* 182*  Some lability , monitor prior to dose change  9.  Dysphagia.  Dysphagia #3 nectar thick liquid diet.  Advance per speech therapy  12/10- make sure pt can get ice chips per  prior protocol.  10.  CKD stage IIIa.  Baseline creatinine 1.3-1.8.  12/10-  last Cr 1.41 down from 1.60 2 days prior- will monitor   BMP Latest Ref Rng & Units 03/15/2021 03/11/2021 03/10/2021  Glucose 70 - 99 mg/dL 213(H) 170(H) 186(H)  BUN 8 - 23 mg/dL 25(H) 22 23  Creatinine 0.61 - 1.24 mg/dL 1.43(H) 1.41(H) 1.54(H)  Sodium 135 - 145 mmol/L 139 135 138  Potassium 3.5 - 5.1 mmol/L 4.0 4.4 4.7  Chloride 98 - 111 mmol/L 104 105 104  CO2 22 - 32 mmol/L 27 25 24   Calcium 8.9 - 10.3 mg/dL 8.7(L) 8.7(L) 8.6(L)   03/16/21 stable  11.  Macrocytic anemia/vitamin B12 deficiency.   -b12 injections x 5 days (thru 12/11)-->oral supplementation -Continue iron supplement for mild iron def anemia 12.  History of prostate cancer with prostatectomy.  Follow-up outpatient. 37.  History of adenocarcinoma left lung with resection 2001.  Follow-up outpatient  12/10- pt usually wheezes per daughter- is chronic for him- s/p lobectomy.   12/11- sounds stable- decreased in LUL-  14.  Hyperlipidemia.  Lipitor 15.  Constipation.  Senokot S2 tabs twice daily, MiraLAX daily.  12/10- last BM was documented 12/9- pt says 12/8- will con't regimen  12/11- 2 good BM's yesterday- con't current regimen   16.  Mild thrombocytopenia- stable no signs of hemorrhage , monitor while on enoxaparin  CBC Latest Ref Rng & Units 03/15/2021 03/10/2021 03/09/2021  WBC 4.0 - 10.5 K/uL 5.4 6.6 6.6  Hemoglobin 13.0 - 17.0 g/dL 12.2(L) 12.3(L) 11.7(L)  Hematocrit 39.0 - 52.0 % 36.5(L) 36.4(L) 34.9(L)  Platelets 150 - 400 K/uL 110(L) 113(L) 117(L)    LOS: 4 days A FACE TO FACE EVALUATION WAS PERFORMED  Charlett Blake 03/16/2021, 9:20 AM

## 2021-03-16 NOTE — Progress Notes (Signed)
Physical Therapy Session Note  Patient Details  Name: [name] MRN: [id number] Date of Birth: 1930/08/27  Today's Date: 03/16/2021 PT Individual Time: 1009-1036 PT Individual Time Calculation (min): 27 min   Short Term Goals: Week 1:  PT Short Term Goal 1 (Week 1): Pt will perform supine<>sit with min assist PT Short Term Goal 2 (Week 1): Pt will perform sit<>stand with min assist using LRAD PT Short Term Goal 3 (Week 1): Pt will perform bed<>chair transfer using LRAD with min assist PT Short Term Goal 4 (Week 1): Pt will ambulate at least 77ft using LRAD with mod assist of 1 PT Short Term Goal 5 (Week 1): Pt will demonstrate improvement in Berg Balance Test by at least 7 points  Skilled Therapeutic Interventions/Progress Updates:    Pt received sitting in w/c and agreeable to therapy session. Pt already wearing L LE PLS AFO.  Transported to/from gym in w/c for time management and energy conservation. Sit<>stands to/from RW with light min assist for balance and hand-over-hand assist for L hand placement on/off RW orthosis - when turning to sit cuing for smaller, more controlled steps to ensure balance safety. Gait training 167ft +112ft +158ft (seated break between each) using RW with min assist for balance throughout - demos intermittent L foot drag during swing that worsens with fatigue resulting in decreased L LE step length and intermittent L LE scissoring causing moderate postural instability - able to achieve reciprocal stepping pattern but often taking too large of steps causing decreased balance - cuing for improvement throughout. Transported back to room and pt left seated in w/c with needs in reach, L UE supported on 1/2 lap tray, and seat belt alarm on.  Therapy Documentation Precautions:  Precautions Precautions: Fall, Other (comment) Precaution Comments: L hemi, posterior bias Restrictions Weight Bearing Restrictions: No   Pain:  No reports of pain throughout  session.   Therapy/Group: Individual Therapy  Tawana Scale , PT, DPT, NCS, CSRS  03/16/2021, 8:04 AM

## 2021-03-16 NOTE — Progress Notes (Signed)
Occupational Therapy Session Note  Patient Details  Name: [name] MRN: [id number] Date of Birth: January 08, 1931  Today's Date: 03/16/2021 OT Individual Time: 9532-0233 OT Individual Time Calculation (min): 69 min    Short Term Goals: Week 1:  OT Short Term Goal 1 (Week 1): Pt will complete grooming task in stance with min A. OT Short Term Goal 2 (Week 1): Pt will don pants with mod A. OT Short Term Goal 3 (Week 1): Pt will don shirt with min A. OT Short Term Goal 4 (Week 1): Pt will independently state 1 method for therapuetic positiong on LUE with no more than min VCs.  Skilled Therapeutic Interventions/Progress Updates:    Pt in bed to start with transfer to the EOB with mod assist.  He then work on donning his shorts, TEDs, and shoes for transfer to the toilet per request.  Mod assist needed for threading and pulling shorts over his hips sit to stand.  Total assist for donning TEDs and then max assist for donning left shoe and AFO with right shoe at min assist level.  He ambulated to the bathroom with max assist and no device where he completed clothing management and toilet hygiene at mod assist level.  Therapist provided total assist for doffing dirty brief and donning new brief with max assist for re-donning his shorts as well.  He was able to complete ambulation back to the wheelchair in front of the sink with max assist and no device.  He washed his hands with setup and then rinsed his dentures at the same level.  Next, therapist took him down to the dayroom where he worked on LUE neuromuscular re-education from the edge of the wheelchair at the high/low table.  He was able to complete towel slides on the table to various targets with min assist overall.  Therapist applied NMES to the left digit extensors for work on wrist and digit extension in conjunction with towel slides forward.  Intensity was set at level 30 and with PPS at 35 and pulse width at 300.  On/off time 10/5 with 2 sec  ramp.  He was able to tolerate 12 mins of active stimulation without any adverse reactions, while working on shoulder flexion and elbow extension.  Finished session with return to the room where he was left sitting up in the wheelchair with the call button and phone in reach and safety alarm belt in place.    Therapy Documentation Precautions:  Precautions Precautions: Fall, Other (comment) Precaution Comments: L hemi, posterior bias Restrictions Weight Bearing Restrictions: No  Pain: Pain Assessment Pain Scale: Faces Pain Score: 0-No pain ADL: See Care Tool Section for some details of mobility and selfcare  Therapy/Group: Individual Therapy  Cauy Melody,Josiyah OTR/L 03/16/2021, 12:13 PM

## 2021-03-17 LAB — GLUCOSE, CAPILLARY
Glucose-Capillary: 222 mg/dL — ABNORMAL HIGH (ref 70–99)
Glucose-Capillary: 230 mg/dL — ABNORMAL HIGH (ref 70–99)
Glucose-Capillary: 219 mg/dL — ABNORMAL HIGH (ref 70–99)
Glucose-Capillary: 206 mg/dL — ABNORMAL HIGH (ref 70–99)

## 2021-03-17 MED ORDER — INSULIN GLARGINE-YFGN 100 UNIT/ML ~~LOC~~ SOLN
15.0000 [IU] | Freq: Every day | SUBCUTANEOUS | Status: DC
  Administered 2021-03-18 – 2021-03-20 (×3): 15 [IU] via SUBCUTANEOUS
  Filled 2021-03-17 (×3): qty 0.15

## 2021-03-17 NOTE — Progress Notes (Signed)
Patient ID: [name], male   DOB: Jun 23, 1930, 87 y.o.   MRN: [id number]  Team Conference Report to Patient/Family  Team Conference discussion was reviewed with the patient and caregiver, including goals, any changes in plan of care and target discharge date.  Patient and caregiver express understanding and are in agreement.  The patient has a target discharge date of 04/02/21.  Sw met with patient spouse. Provided conference updates. Patient spouse is in a wheelchair and unable to provide physical assistance. Pt spouse reports they are not interested in SNF and patient will have assistance at home.  Dyanne Iha 03/17/2021, 2:51 PM

## 2021-03-17 NOTE — Patient Care Conference (Signed)
Inpatient RehabilitationTeam Conference and Plan of Care Update Date: 03/17/2021   Time: 10:30 AM    Patient Name: [name]      Medical Record Number: [id number]  Date of Birth: [birth year] Sex: Male         Room/Bed: 4W09C/4W09C-01 Payor Info: Payor: [employer] MEDICARE / Plan: [employer] MEDICARE HMO/PPO / Product Type: *No Product type* /    Admit Date/Time:  03/12/2021  3:40 PM  Primary Diagnosis:  Right pontine cerebrovascular accident [facility])  Hospital Problems: Principal Problem:   Right pontine cerebrovascular accident [facility])    Expected Discharge Date: Expected Discharge Date: 04/02/21  Team Members Present: Physician leading conference: Dr. Alysia Penna Social Worker Present: Erlene Quan, BSW Nurse Present: Dorien Chihuahua, RN PT Present: Page Spiro, PT OT Present: Clyda Greener, OT SLP Present: Sherren Kerns, SLP PPS Coordinator present : Gunnar Fusi, SLP     Current Status/Progress Goal Weekly Team Focus  Bowel/Bladder   Continent of Bladder/Bowel  Maintain Continence      Swallow/Nutrition/ Hydration   Dys 3, NTL. Ice protocol  Mod I for least restrictive diet (regular/thin likely)  trials of thin, repeat MBS   ADL's   Min assist for UB bathing with mod assist for UB dressing.  Mod assist for LB dressing sit to stand as well.  Brunnstrum stage IV in the left arm and hand with mod assist for integration into functional tasks.  supervision overall  selfcare retraining, transfer training, DME education, neuromuscular re-education, therapeutic exercise   Mobility   min assist bed mobility, min/mod assist sit<>stand and stand pivot transfers using RW vs without AD, steady min assist gait up to 141ft using RW  supervision overall at ambulatory level  activity tolerance, L LE NMR, dynamic standing balance, dynamic gait training, pt/family education, transfer training, DME training   Communication   SPV speech intelligibility strategies  Mod I  utilizing  speech strategies during conversation   Safety/Cognition/ Behavioral Observations            Pain   No complains of pain  Remain pain free      Skin   Wrong Entry, Hx of Lobectomy (2014). Minor abrasions on arms,  No new skin breakdowns,        Discharge Planning:  D/c home with children to assist during the day, spouse assisting at night   Team Discussion: Patient post ventral pontine infarct. CKD WNL and CBGs labile per MD. DAPT through 03/29/21; them ASA solo.  Patient on target to meet rehab goals: yes, currently needs min assist for upper body bathing and mod assist for upper body dressing. Needs max assist for management of TEDs and AFO. Left LE weakness limits function; needs mod assist to integrate into a functional task. Completes stand pivot transfers with min - mod assist with posterior loss of balance. Able to ambulate using a rolling walker and min assist. Goals for discharge set for supervision - CGA.   *See Care Plan and progress notes for long and short-term goals.   Revisions to Treatment Plan:  AFO consult   Teaching Needs: Safety, aspiration precautions, speech strategies, diet restrictions, medication management, dietary modifications, secondary risk management, etc.  Current Barriers to Discharge: Decreased caregiver support  Possible Resolutions to Barriers: Family education DME: walker     Medical Summary Current Status: BP with mild lability, chronic renal failure stable, DM uncontrolled  Barriers to Discharge: Medical stability   Possible Resolutions to [employer] Focus: further DM management needed, monitor renal  status   Continued Need for Acute Rehabilitation Level of Care: The patient requires daily medical management by a physician with specialized training in physical medicine and rehabilitation for the following reasons: Direction of a multidisciplinary physical rehabilitation program to maximize functional independence : Yes Medical  management of patient stability for increased activity during participation in an intensive rehabilitation regime.: Yes Analysis of laboratory values and/or radiology reports with any subsequent need for medication adjustment and/or medical intervention. : Yes   I attest that I was present, lead the team conference, and concur with the assessment and plan of the team.   Dorien Chihuahua B 03/17/2021, 2:32 PM

## 2021-03-17 NOTE — Progress Notes (Signed)
PROGRESS NOTE   Subjective/Complaints:  Discussed d/c date with pt  No issues overnite,poor sitting balance with PT, will need AFO   ROS:  Pt denies increased SOB, abd pain, CP, N/V/C/D, and vision changes   Objective:   No results found. Recent Labs    03/15/21 0538  WBC 5.4  HGB 12.2*  HCT 36.5*  PLT 110*    Recent Labs    03/15/21 0538  NA 139  K 4.0  CL 104  CO2 27  GLUCOSE 213*  BUN 25*  CREATININE 1.43*  CALCIUM 8.7*      Intake/Output Summary (Last 24 hours) at 03/17/2021 1034 Last data filed at 03/17/2021 0855 Gross per 24 hour  Intake 598 ml  Output --  Net 598 ml         Physical Exam: Vital Signs Blood pressure (!) 145/84, pulse 66, temperature 98.1 F (36.7 C), resp. rate 16, height _0  (1.753 m), weight 82.3 kg, SpO2 97 %.  General: No acute distress Mood and affect are appropriate Heart: Regular rate and rhythm no rubs murmurs or extra sounds Lungs: Clear to auscultation, breathing unlabored, no rales or wheezes Abdomen: Positive bowel sounds, soft nontender to palpation, nondistended Extremities: No clubbing, cyanosis, or edema Skin: No evidence of breakdown, no evidence of rash  Musculoskeletal:     Cervical back: Normal range of motion and neck supple.     Comments: Full ROM, No pain with AROM or PROM in the neck, trunk, or extremities. Posture appropriate   Skin:    General: Skin is warm and dry.  Neurological:     Mental Status: He is alert.     Comments: Patient is alert.  Makes eye contact with examiner.  He was able to provide his name place, month, year, why he's here.  Follows basic commands. Left central 7 and tongue deviation. LUE 2/5 deltoid, 3/5 bicep, 2+ tricep, 1+ to 2- wrist/fingers. LLE 2+ to 3/5. RUE and RLE 4 to 4+/5 prox to distal. Sensory exam normal for light touch and pain in all 4 limbs. No limb ataxia or cerebellar signs. No abnormal tone  appreciated.  No abnormal tone   Assessment/Plan: 1. Functional deficits which require 3+ hours per day of interdisciplinary therapy in a comprehensive inpatient rehab setting. Physiatrist is providing close team supervision and 24 hour management of active medical problems listed below. Physiatrist and rehab team continue to assess barriers to discharge/monitor patient progress toward functional and medical goals  Care Tool:  Bathing    Body parts bathed by patient: Left lower leg, Face, Left arm, Chest, Abdomen, Right upper leg, Buttocks, Front perineal area, Left upper leg, Right lower leg   Body parts bathed by helper: Buttocks, Right arm     Bathing assist Assist Level: Moderate Assistance - Patient 50 - 74%     Upper Body Dressing/Undressing Upper body dressing   What is the patient wearing?: Button up shirt, Hospital gown only    Upper body assist Assist Level: Moderate Assistance - Patient 50 - 74%    Lower Body Dressing/Undressing Lower body dressing      What is the patient wearing?: Underwear/pull up, Pants  Lower body assist Assist for lower body dressing: Moderate Assistance - Patient 50 - 74%     Toileting Toileting    Toileting assist Assist for toileting: Dependent - Patient 0% (w/ stedy)     Transfers Chair/bed transfer  Transfers assist     Chair/bed transfer assist level: Minimal Assistance - Patient > 75% Chair/bed transfer assistive device: Walker, Clinical biochemist   Ambulation assist   Ambulation activity did not occur: Safety/medical concerns  Assist level: Minimal Assistance - Patient > 75% Assistive device: Walker-rolling Max distance: 17f   Walk 10 feet activity   Assist  Walk 10 feet activity did not occur: Safety/medical concerns        Walk 50 feet activity   Assist Walk 50 feet with 2 turns activity did not occur: Safety/medical concerns         Walk 150 feet activity   Assist Walk  150 feet activity did not occur: Safety/medical concerns         Walk 10 feet on uneven surface  activity   Assist Walk 10 feet on uneven surfaces activity did not occur: Safety/medical concerns         Wheelchair     Assist Is the patient using a wheelchair?: Yes Type of Wheelchair: Manual    Wheelchair assist level: Dependent - Patient 0%      Wheelchair 50 feet with 2 turns activity    Assist        Assist Level: Dependent - Patient 0%   Wheelchair 150 feet activity     Assist      Assist Level: Dependent - Patient 0%   Blood pressure (!) 145/84, pulse 66, temperature 98.1 F (36.7 C), resp. rate 16, height _0  (1.753 m), weight 82.3 kg, SpO2 97 %.  Medical Problem List and Plan: 1.  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 Formulized individual treatment plan based on medical history, underlying problem and comorbidities.    Continue CIR- PT, OT and SLP 2.  Antithrombotics: -DVT/anticoagulation:  Pharmaceutical: Lovenox             -antiplatelet therapy: Aspirin 81 mg daily and Plavix 75 mg day x3 weeks then aspirin alone.  Plavix can be discontinued after last dose 03/29/2021 3. Pain Management: Tylenol as needed 4. Mood/dementia: Aricept 10 mg nightly, Zoloft 100 mg daily             -antipsychotic agents: N/A 5. Neuropsych: This patient is capable of making decisions on his own behalf. 6. Skin/Wound Care: Routine skin checks 7. Fluids/Electrolytes/Nutrition: Routine in and outs with follow-up chemistries 8.  Diabetes mellitus.  Hemoglobin A1c 8.3.  Semglee just adjusted to 10 units daily.  Check blood sugars before meals and at bedtime              -cbg's poorly controlled at present.  CBG (last 3)  Recent Labs    03/16/21 1636 03/16/21 2125 03/17/21 0620  GLUCAP 206* 226* 222*   Elevated , increase Semglee to 15U  9.  Dysphagia.  Dysphagia #3 nectar thick liquid diet.  Advance per speech therapy  12/14 should be  ready for repeat swallow study  10.  CKD stage IIIa.  Baseline creatinine 1.3-1.8.  12/10- last Cr 1.41 down from 1.60 2 days prior- will monitor   BMP Latest Ref Rng & Units 03/15/2021 03/11/2021 03/10/2021  Glucose 70 - 99 mg/dL 213(H) 170(H) 186(H)  BUN 8 - 23 mg/dL 25(H) 22 23  Creatinine 0.61 - 1.24 mg/dL 1.43(H) 1.41(H) 1.54(H)  Sodium 135 - 145 mmol/L 139 135 138  Potassium 3.5 - 5.1 mmol/L 4.0 4.4 4.7  Chloride 98 - 111 mmol/L 104 105 104  CO2 22 - 32 mmol/L _0 Calcium 8.9 - 10.3 mg/dL 8.7(L) 8.7(L) 8.6(L)   03/17/21 stable  11.  Macrocytic anemia/vitamin B12 deficiency.   -b12 injections x 5 days (thru 12/11)-->oral supplementation -Continue iron supplement for mild iron def anemia 12.  History of prostate cancer with prostatectomy.  Follow-up outpatient. 40.  History of adenocarcinoma left lung with resection 2001.  Follow-up outpatient  12/10- pt usually wheezes per daughter- is chronic for him- s/p lobectomy.   12/11- sounds stable- decreased in LUL-  14.  Hyperlipidemia.  Lipitor 15.  Constipation.  Senokot S2 tabs twice daily, MiraLAX daily.     16.  Mild thrombocytopenia- stable no signs of hemorrhage , monitor while on enoxaparin  CBC Latest Ref Rng & Units 03/15/2021 03/10/2021 03/09/2021  WBC 4.0 - 10.5 K/uL 5.4 6.6 6.6  Hemoglobin 13.0 - 17.0 g/dL 12.2(L) 12.3(L) 11.7(L)  Hematocrit 39.0 - 52.0 % 36.5(L) 36.4(L) 34.9(L)  Platelets 150 - 400 K/uL 110(L) 113(L) 117(L)    LOS: 5 days A FACE TO FACE EVALUATION WAS PERFORMED  Charlett Blake 03/17/2021, 10:34 AM

## 2021-03-17 NOTE — Progress Notes (Signed)
Orthopedic Tech Progress Note Patient Details:  [name] 1930-10-21 [id number]  Called in order to [facility] for an AFO CONSULT   Patient ID: [name], male   DOB: 1930-04-15, 87 y.o.   MRN: [id number]  [name] 03/17/2021, 11:37 AM

## 2021-03-17 NOTE — Progress Notes (Signed)
Occupational Therapy Session Note  Patient Details  Name: [name] MRN: [id number] Date of Birth: 08-09-1930  Today's Date: 03/17/2021 OT Individual Time: 5638-9373 OT Individual Time Calculation (min): 74 min    Short Term Goals: Week 1:  OT Short Term Goal 1 (Week 1): Pt will complete grooming task in stance with min A. OT Short Term Goal 2 (Week 1): Pt will don pants with mod A. OT Short Term Goal 3 (Week 1): Pt will don shirt with min A. OT Short Term Goal 4 (Week 1): Pt will independently state 1 method for therapuetic positiong on LUE with no more than min VCs.  Skilled Therapeutic Interventions/Progress Updates:    Pt in wheelchair to start agreeable to completion of toileting tasks and showering.  He was able to ambulate to the 3:1 over the toilet with mod assist using the RW with hand splint on the left side for support.  Mod assist for clothing management with min assist for toilet hygiene in sitting with lateral leans.  He needed mod assist for removal of LB clothing in preparation for transfer to the shower.  Mod assist was needed for transfer over to the shower seat with use of the RW.  Once sitting on the seat, he was able to complete bathing with mod instructional cueing to sequence as he initially just ran the water over his body and did not try to use soap or the washcloth.  Once cued he was able to integrate the LUE at a gross assist for holding the soap and pouring it on the washcloth with min facilitation.  Mod assist needed for using the LUE to wash the right arm and shoulder.  Mod assist for LB bathing sit to stand.  Once he dried off, he completed  transfer over to the wheelchair stand pivot with mod assist.  He needed max assist for donning his button up shirt with mod assist for donning brief and pants, with pt putting the RLE in the same opening as the left on both attempts.  He needed total assist for TEDs and left shoe with AFO.  He donned the right shoe with min  assist.  Finished session with completion of oral hygiene with setup from the wheelchair.  Finished session with pt transferring back to the bed at mod assist stand pivot with transition to supine at min assist level.  Call button and phone in reach with safety alarm in place.    Therapy Documentation Precautions:  Precautions Precautions: Fall, Other (comment) Precaution Comments: L hemi, posterior bias Restrictions Weight Bearing Restrictions: No  Pain: Pain Assessment Pain Scale: Faces Pain Score: 0-No pain ADL: See Care Tool Section for some details of mobility and selfcare   Therapy/Group: Individual Therapy  Boruch Manuele,[place] OTR/L 03/17/2021, 10:34 AM

## 2021-03-17 NOTE — Progress Notes (Signed)
Physical Therapy Session Note  Patient Details  Name: [name] MRN: [id number] Date of Birth: [birth year]  Today's Date: 03/17/2021 PT Individual Time: 0804-0906 PT Individual Time Calculation (min): 62 min   Short Term Goals: Week 1:  PT Short Term Goal 1 (Week 1): Pt will perform supine<>sit with min assist PT Short Term Goal 2 (Week 1): Pt will perform sit<>stand with min assist using LRAD PT Short Term Goal 3 (Week 1): Pt will perform bed<>chair transfer using LRAD with min assist PT Short Term Goal 4 (Week 1): Pt will ambulate at least 69ft using LRAD with mod assist of 1 PT Short Term Goal 5 (Week 1): Pt will demonstrate improvement in Berg Balance Test by at least 7 points  Skilled Therapeutic Interventions/Progress Updates:    Pt received supine, asleep in bed and upon awakening agreeable to therapy session. Supine>sitting L EOB, using HOB and bedrail features as pt has hospital bed at home, with min assist for trunk upright. Sitting EOB with close supervision due to pt having 1x total posterior LOB and 1x moderate R LOB while threading LB clothing with max assist - donned tennis shoes with L LE [place] walk-on AFO total assist for time management. Sit>stand EOB>RW with min assist for rising to stand and balance - min assist in standing while pulling pants up over hips min assist for L side. Short distance ~48ft ambulatory transfer to w/c using RW with min assist for balance.    Transported to/from gym in w/c for time management and energy conservation.  Sit<>stands w/c<>RW with lighter min assist at this time - reiterating cuing to push up with R UE - educated pt on managing L hand placement on/off RW orthosis with pt carrying through during session with verbal cuing.  Gait training ~183ft overground using RW with min assist or balance - continues to have poor L LE foot clearance that worsens with fatigue as well as overall increased balance instability - reciprocal stepping  pattern achieved. HR 82bpm and SpO2 97%  Stepped on/off treadmill using B UE support on litegait rails with min assist for balance and cuing for sequencing to step-up with R LE and down with L LE.   Participated in high intensity gait training on litegait using B UE support and in harness for safety but not for BWS. - Trial 1: 68min41sec at 0.70mph totaling 84 steps - max facilitation for increased L LE foot clearance and step length due to significant toe drag and max verbal and visual cuing to increase R step length as pt with very short L stance phase having a quick, short forward R step  - HR 90bpm and SpO2 96% - Trial 2: 76min32sec at 0.20mph totaling 27ft - mod assist for L LE advancement during swing due to lack of foot clearance and improving reciprocal pattern until ~72min when pt becomes fatigued and starts having shorter L stance time with quick R forward step-to pattern with heavy R foot landing  - Trial 3: 76min13sec at 0.20mph totaling 171ft - mod assist for L LE advancement during swing, cuing for L hip/knee extension during L stance and facilitating L weight shift to prolong stance and then increased R step length for reciprocal pattern though inconsistent - continues to report R UE fatigue demoing pt compensating significantly with that UE for balance and weight shifting   Doffed harness and stepped off treadmill as described above. Transported back to room and pt agreeable to remain sitting up in wheelchair. Left with  needs in reach, LUE supported on 1/2 lap tray, and seat belt alarm on.  Therapy Documentation Precautions:  Precautions Precautions: Fall, Other (comment) Precaution Comments: L hemi, posterior bias Restrictions Weight Bearing Restrictions: No   Pain:  No reports of pain throughout session.    Therapy/Group: Individual Therapy  Tawana Scale , PT, DPT, NCS, CSRS  03/17/2021, 7:43 AM

## 2021-03-17 NOTE — Progress Notes (Signed)
Occupational Therapy Session Note  Patient Details  Name: [name] MRN: [id number] Date of Birth: [birth year]  Today's Date: 03/17/2021 OT Individual Time: 1779-3903 OT Individual Time Calculation (min): 54 min    Short Term Goals: Week 1:  OT Short Term Goal 1 (Week 1): Pt will complete grooming task in stance with min A. OT Short Term Goal 2 (Week 1): Pt will don pants with mod A. OT Short Term Goal 3 (Week 1): Pt will don shirt with min A. OT Short Term Goal 4 (Week 1): Pt will independently state 1 method for therapuetic positiong on LUE with no more than min VCs.   Skilled Therapeutic Interventions/Progress Updates:    Pt greeted at time of session bed level with wife present, no pain reported. Pt wife with several questions stating she was supposed to meet with SW at 78 and wanted to speak with SW. Relayed to SW that family wanted to have meeting and SW quickly presented self to answer questions. Pt supine > sit Min A and Stedy bed > wheelchair for time and energy. Sit > stands x2 at sink level with Min A, no knee buckling noted and able to lateral weight shift. Wheelchair transport to gym and sit > stand at high/low table and focused on LUE NMR for grasping cones, crossing midline, and stacking on opposite side, hand over hand for full grasp/release pattern and distal support at wrist for task, all performed in standing. Seated weight bearing 2x10 on large therapy ball for NMR, chest press with large therapy ball 2x10 as well with distal support for symmetry and bimanual tasks, 1x10 ball toss to again encourage bimanual task with difficulty keeping LUE at same level as RUE. Transported back to room, stedy wheelchair > bed. Alarm on call bell in reach.     Therapy Documentation Precautions:  Precautions Precautions: Fall, Other (comment) Precaution Comments: L hemi, posterior bias Restrictions Weight Bearing Restrictions: No     Therapy/Group: Individual  Therapy  Viona Gilmore 03/17/2021, 3:30 PM

## 2021-03-18 LAB — GLUCOSE, CAPILLARY
Glucose-Capillary: 212 mg/dL — ABNORMAL HIGH (ref 70–99)
Glucose-Capillary: 244 mg/dL — ABNORMAL HIGH (ref 70–99)
Glucose-Capillary: 207 mg/dL — ABNORMAL HIGH (ref 70–99)
Glucose-Capillary: 194 mg/dL — ABNORMAL HIGH (ref 70–99)

## 2021-03-18 NOTE — Progress Notes (Signed)
Occupational Therapy Weekly Progress Note  Patient Details  Name: [name] MRN: [id number] Date of Birth: [birth year]  Beginning of progress report period: March 13, 2021 End of progress report period: March 18, 2021  Today's Date: 03/18/2021 OT Individual Time: 2725-3664 OT Individual Time Calculation (min): 57 min    Patient has met 3 of 4 short term goals.  He is making steady progress with OT at this time.  Overall, he is able to complete UB bathing at min assist with UB dressing at max assist for donning a button up shirt.  Feel like this will improve dramatically with use of a pullover.  He is able to complete LB bathing at min assist level with LB dressing at mod assist level sit to stand.  He needs min instructional cueing for sequencing and following hemidressing techniques.  Transfers are mod assist approaching min assist with use of the RW and AFO on the left foot.  Mod assist is needed for stand pivots without an assistive device.  He is able to use the LUE at  a gross assist level with supervision for holding items to be opened with mod assist for use at a diminished level with regards to washing his RUE.  Feel overall he is making steady progress with OT at this time and is on target to meed supervision to contact guard goals for home.  Recommend continued CIR level therapy to progress to this level with ongoing family/pt education as appropriate.    Patient continues to demonstrate the following deficits: muscle weakness and muscle paralysis, impaired timing and sequencing, unbalanced muscle activation, and decreased coordination, decreased memory, and decreased sitting balance, decreased standing balance, decreased postural control, hemiplegia, and decreased balance strategies and therefore will continue to benefit from skilled OT intervention to enhance overall performance with BADL, iADL, and Reduce care partner burden.  Patient progressing toward long term goals..   Continue plan of care.  OT Short Term Goals Week 2:  OT Short Term Goal 1 (Week 2): Pt will don shirt with min A. OT Short Term Goal 2 (Week 2): Pt will donn his LB clothing at min assist level excluding AFO. OT Short Term Goal 3 (Week 2): Pt will use the LUE at a diminshed level for washing the RUE with min facilitation. OT Short Term Goal 4 (Week 2): Pt will complete toilet transfers and toileting tasks with min assist sit to stand.  Skilled Therapeutic Interventions/Progress Updates:    Session 1: [phone])  Pt completed supine to sit EOB with mod assist, rolling to the left and pushing up off of the hemi arm.  He then completed stand pivot transfer to the wheelchair in order to work on washing and grooming tasks at the sink.  He was able to complete sit to stand at the sink with mod instructional cueing for scooting forward to the edge of the chair and pushing off of the surface, instead of pulling on the sink.  He was able to doff his old brief and wash his front and back peri area with min assist.  He then sat and donned his new brief and clean pants with mod assist.  Therapist assisted with donning TEDs and then he donned the right shoe with supervision and the left shoe with max assist including the AFO.  Next, he stood with min assist to complete oral hygiene with integration of the LUE for holding the toothpaste to remove the cap and for holding the toothbrush when  applying the past, both at min assist level.  He also sat in his wheelchair to complete shaving with the LUE and electric shaver with mod facilitation for control and active movement.  He finished session sitting in the wheelchair with therapist applying NMES to the dorsal forearm for facilitation of wrist and digit extensors.  He was able to tolerate 13 mins of active stimulation without any adverse reactions.  While stimulation was active, he was instructed to assist with opening the hand and extending the wrist as well.  Intensity  at level 32 using Empi with on time at 10 seconds/off time at 5 seconds with 2 sec ramp.  PPS at 35 with pulse width at 300.  Once this was complete, he was left sitting up in the wheelchair with the call button and phone in reach with safety alarm belt in place.    Session 2: (1450-1535)  Pt in wheelchair to start with his spouse present.  He was taken down to the therapy gym where he focused on LUE weightbearing and functional use.  Began with standing at the high/low table with the LUE placed in weightbearing and pt having to reach across his body to pick up and place pegs.  Min assist needed for balance to complete several intervals of standing and reaching for up to 4 mins.  He then worked in sitting on reaching forward to knee level with the LUE to remove pegs from the board with overall min assist.  Therapist then issued small foam piece with demonstration on picking them up and placing them into a cup without shoulder hike compensation.  He returned to the room where he finished session with mod assist stand pivot transfer to the bed and then to supine at min assist level.  Call button and phone in reach with safety alarm on and family still present.   Therapy Documentation Precautions:  Precautions Precautions: Fall, Other (comment) Precaution Comments: L hemi, posterior bias Restrictions Weight Bearing Restrictions: No  Pain: Pain Assessment Pain Scale: Faces Pain Score: 0-No pain ADL: See Care Tool Section for some details of mobility and selfcare   Therapy/Group: Individual Therapy  Lorelai Huyser,[place] OTR/L 03/18/2021, 4:29 PM

## 2021-03-18 NOTE — Progress Notes (Signed)
Recreational Therapy Assessment and Plan  Patient Details  Name: [name] MRN: [id number] Date of Birth: September 18, 1930 Today's Date: 03/18/2021  Rehab Potential:  Good ELOS:   d/c 12/30  Assessment Hospital Problem: Principal Problem:   Right pontine cerebrovascular accident [facility])     Past Medical History:      Past Medical History:  Diagnosis Date   Adenocarcinoma of left lung, stage 1 ([place]) 2001    T1N0 stage I  adenoca left lung resected 01/03/00    Alzheimer's disease ([place])     CAD (coronary artery disease) 2014    a. 09/2012 Cath: LM nl, LAD 50-60p, D1 60-70 m, D1 50ost, LCX nl, RCA min irregs, EF 55-65%.   Generalized anxiety disorder 01/08/2007    Qualifier: Diagnosis of  By: Diona Browner MD, [name]     History of colonic polyps 07/25/2011   Macular degeneration of both eyes 01/21/2014   Nonmelanoma skin cancer 07/25/2011    Multiple lesions excised face/nose   Pericarditis      a. 09/2012 with effusion and tamponade, s/p window.  b.  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 SCC IN SITU TX CX3   SCC (squamous cell carcinoma) 04/12/2005    RIGHT NECK SUP. SCC IN SITU TX EXC   SCC (squamous cell carcinoma) 04/16/2013    LEFT TEMPLE SCC IN SITU TX CX3 5FU   SCC (squamous cell carcinoma) 04/16/2013    BELOW LEFT EYE SCC IN SITU TX CX3 5FU   SCC (squamous  cell carcinoma) 04/16/2013    BELOW RIGHT EYE SCC IN SITU TX CX3 5FU   SCC (squamous cell carcinoma) 08/04/2014    LEFT CHEEK SCC IN SITU TX CX3 5FU   SCC (squamous cell carcinoma) 11/27/2018    LEFT TEMPLE SCC IN SITU TX WITH BX   SCC (squamous cell carcinoma)     SCC (squamous cell carcinoma)     SCC (squamous cell carcinoma)     SCC (squamous cell carcinoma) Well Diff 09/13/2016    Tip of Nose SCC WELL DIFF TX (MOH's), and RIGHT INNER EYE SUP. 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   Type 2 diabetes mellitus with vascular disease ([place]) 05/21/2007    Qualifier: Diagnosis of  By: Diona Browner MD, [name]     Type 2 DM with CKD stage 3 and hypertension ([place]) 07/16/2015   Unspecified essential hypertension      Past Surgical History:       Past Surgical History:  Procedure Laterality Date   HERNIA REPAIR       LEFT HEART CATHETERIZATION WITH CORONARY ANGIOGRAM N/A 09/13/2012    Procedure: LEFT HEART  CATHETERIZATION WITH CORONARY ANGIOGRAM;  Surgeon: Sherren Mocha, MD;  Location: [facility] CATH LAB;  Service: Cardiovascular;  Laterality: N/A;   LOBECTOMY   2001    upper left   PERICARDIAL TAP N/A 09/16/2012    Procedure: PERICARDIAL TAP;  Surgeon: Sinclair Grooms, MD;  Location: [facility] CATH LAB;  Service: Cardiovascular;  Laterality: N/A;   PROSTATECTOMY   2003   SUBXYPHOID PERICARDIAL WINDOW N/A 09/16/2012    Procedure: SUBXYPHOID PERICARDIAL WINDOW;  Surgeon: Rexene Alberts, MD;  Location: [facility] OR;  Service: Thoracic;  Laterality: N/A;   TONSILLECTOMY          Assessment & Plan Clinical Impression: Patient is a 87 y.o. right-handed male with history of Alzheimer's dementia maintained on Aricept, type 2 diabetes mellitus, hypertension, CKD stage III, macular degeneration, adenocarcinoma of left lung with resection 2001, prostate cancer with prostatectom 2003 y, CAD, quit smoking 36 years ago..  Per chart review patient lives with spouse as well as a live-in caregiver who assist with meal preparation and ADLs.  Patient using rolling walker prior to admission.  1 level home with ramped entrance.  He also has good outside extended family.  Presented 03/07/2021 with left-sided weakness x2 to 3 days.  Cranial CT scan negative.  MRI identified small foci of acute ischemia within the ventral right pons and left frontal periventricular white matter.  No hemorrhage or mass-effect.  MRA with no intracranial large vessel occlusion.  Patient did not receive tPA.  Admission chemistries unremarkable except potassium 5.2 glucose 229 creatinine 1.50, alcohol negative, hemoglobin A1c 8.3, urinalysis negative nitrite.  Echocardiogram with ejection fraction of 50 to 55% no wall motion abnormalities grade 1 diastolic dysfunction.  Neurology follow-up maintained on aspirin 81 mg daily and Plavix 75 mg daily for CVA prophylaxis x3 weeks then aspirin alone.  Lovenox for DVT prophylaxis.  Dysphagia #3 nectar thick liquid diet.   Therapy evaluations completed due to patient's left-sided weakness decreased functional mobility was admitted for a comprehensive rehab program. Patient transferred to CIR on 03/12/2021 .    Met with pt today to discuss leisure interests, leisure education, activity analysis/modifications.  Pt presents with decreased activity tolerance, decreased functional mobility, decreased balance Limiting pt's independence with leisure/community pursuits.  Plan  Min 1 TR session during LOS  Recommendations for other services: None   Discharge Criteria: Patient will be discharged from TR  if patient refuses treatment 3 consecutive times without medical reason.  If treatment goals not met, if there is a change in medical status, if patient makes no progress towards goals or if patient is discharged from hospital.  The above assessment, treatment plan, treatment alternatives and goals were discussed and mutually agreed upon: by patient  [place] 03/18/2021, 3:59 PM

## 2021-03-18 NOTE — Progress Notes (Signed)
PROGRESS NOTE   Subjective/Complaints:  Working with OT , discussed E stim to wrist extensors, did supported treadmnill training yesterday   ROS:  Pt denies increased SOB, abd pain, CP, N/V/C/D, and vision changes   Objective:   No results found. No results for input(s): WBC, HGB, HCT, PLT in the last 72 hours.  No results for input(s): NA, K, CL, CO2, GLUCOSE, BUN, CREATININE, CALCIUM in the last 72 hours.    Intake/Output Summary (Last 24 hours) at 03/18/2021 0949 Last data filed at 03/18/2021 0100 Gross per 24 hour  Intake 236 ml  Output 200 ml  Net 36 ml         Physical Exam: Vital Signs Blood pressure 137/69, pulse 64, temperature 98.2 F (36.8 C), resp. rate 15, height 5\' 9"  (1.753 m), weight 81.9 kg, SpO2 97 %.  General: No acute distress Mood and affect are appropriate Heart: Regular rate and rhythm no rubs murmurs or extra sounds Lungs: Clear to auscultation, breathing unlabored, no rales or wheezes Abdomen: Positive bowel sounds, soft nontender to palpation, nondistended Extremities: No clubbing, cyanosis, or edema Skin: No evidence of breakdown, no evidence of rash  Musculoskeletal:     Cervical back: Normal range of motion and neck supple.     Comments: Full ROM, No pain with AROM or PROM in the neck, trunk, or extremities. Posture appropriate   Skin:    General: Skin is warm and dry.  Neurological:     Mental Status: He is alert.     Comments: Patient is alert.  Makes eye contact with examiner.  He was able to provide his name place, month, year, why he's here.  Follows basic commands. Left central 7 and tongue deviation. LUE 2/5 deltoid, 3/5 bicep, 2+ tricep, 1+ to 2- wrist/fingers. LLE 2+ to 3/5. RUE and RLE 4 to 4+/5 prox to distal. Sensory exam normal for light touch and pain in all 4 limbs. No limb ataxia or cerebellar signs. No abnormal tone appreciated.  No abnormal tone    Assessment/Plan: 1. Functional deficits which require 3+ hours per day of interdisciplinary therapy in a comprehensive inpatient rehab setting. Physiatrist is providing close team supervision and 24 hour management of active medical problems listed below. Physiatrist and rehab team continue to assess barriers to discharge/monitor patient progress toward functional and medical goals  Care Tool:  Bathing    Body parts bathed by patient: Left lower leg, Face, Left arm, Chest, Abdomen, Right upper leg, Buttocks, Front perineal area, Left upper leg, Right lower leg   Body parts bathed by helper: Buttocks, Right arm     Bathing assist Assist Level: Moderate Assistance - Patient 50 - 74%     Upper Body Dressing/Undressing Upper body dressing   What is the patient wearing?: Button up shirt, Hospital gown only    Upper body assist Assist Level: Moderate Assistance - Patient 50 - 74%    Lower Body Dressing/Undressing Lower body dressing      What is the patient wearing?: Underwear/pull up, Pants     Lower body assist Assist for lower body dressing: Moderate Assistance - Patient 50 - 74%     Toileting Toileting  Toileting assist Assist for toileting: Dependent - Patient 0% (w/ stedy)     Transfers Chair/bed transfer  Transfers assist     Chair/bed transfer assist level: Minimal Assistance - Patient > 75% Chair/bed transfer assistive device: Walker, Clinical biochemist   Ambulation assist   Ambulation activity did not occur: Safety/medical concerns  Assist level: Minimal Assistance - Patient > 75% Assistive device: Walker-rolling Max distance: 143ft   Walk 10 feet activity   Assist  Walk 10 feet activity did not occur: Safety/medical concerns        Walk 50 feet activity   Assist Walk 50 feet with 2 turns activity did not occur: Safety/medical concerns         Walk 150 feet activity   Assist Walk 150 feet activity did not occur:  Safety/medical concerns         Walk 10 feet on uneven surface  activity   Assist Walk 10 feet on uneven surfaces activity did not occur: Safety/medical concerns         Wheelchair     Assist Is the patient using a wheelchair?: Yes Type of Wheelchair: Manual    Wheelchair assist level: Dependent - Patient 0%      Wheelchair 50 feet with 2 turns activity    Assist        Assist Level: Dependent - Patient 0%   Wheelchair 150 feet activity     Assist      Assist Level: Dependent - Patient 0%   Blood pressure 137/69, pulse 64, temperature 98.2 F (36.8 C), resp. rate 15, height 5\' 9"  (1.753 m), weight 81.9 kg, SpO2 97 %.  Medical Problem List and Plan: 1.  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  Antithrombotics: -DVT/anticoagulation:  Pharmaceutical: Lovenox             -antiplatelet therapy: Aspirin 81 mg daily and Plavix 75 mg day x3 weeks then aspirin alone.  Plavix can be discontinued after last dose 03/29/2021 3. Pain Management: Tylenol as needed 4. Mood/dementia: Aricept 10 mg nightly, Zoloft 100 mg daily             -antipsychotic agents: N/A 5. Neuropsych: This patient is capable of making decisions on his own behalf. 6. Skin/Wound Care: Routine skin checks 7. Fluids/Electrolytes/Nutrition: Routine in and outs with follow-up chemistries 8.  Diabetes mellitus.  Hemoglobin A1c 8.3.  Semglee just adjusted to 10 units daily.  Check blood sugars before meals and at bedtime             -cbg's poorly controlled at present.  CBG (last 3)  Recent Labs    03/17/21 1654 03/17/21 2136 03/18/21 0610  GLUCAP 219* 206* 212*   Elevated , increase Semglee to 20U   9.  Dysphagia.  Dysphagia #3 nectar thick liquid diet.  Advance per speech therapy  12/14 should  be ready for repeat swallow study  10.  CKD stage IIIa.  Baseline creatinine 1.3-1.8.  12/10- last Cr 1.41 down from 1.60 2 days prior- will monitor   BMP Latest Ref Rng & Units 03/15/2021 03/11/2021 03/10/2021  Glucose 70 - 99 mg/dL 213(H) 170(H) 186(H)  BUN 8 - 23 mg/dL 25(H) 22  23  Creatinine 0.61 - 1.24 mg/dL 1.43(H) 1.41(H) 1.54(H)  Sodium 135 - 145 mmol/L 139 135 138  Potassium 3.5 - 5.1 mmol/L 4.0 4.4 4.7  Chloride 98 - 111 mmol/L 104 105 104  CO2 22 - 32 mmol/L 27 25 24   Calcium 8.9 - 10.3 mg/dL 8.7(L) 8.7(L) 8.6(L)   03/17/21 stable  11.  Macrocytic anemia/vitamin B12 deficiency.   -b12 injections x 5 days (thru 12/11)-->oral supplementation -Continue iron supplement for mild iron def anemia 12.  History of prostate cancer with prostatectomy.  Follow-up outpatient. 53.  History of adenocarcinoma left lung with resection 2001.  Follow-up outpatient  12/10- pt usually wheezes per daughter- is chronic for him- s/p lobectomy.   12/11- sounds stable- decreased in LUL-  14.  Hyperlipidemia.  Lipitor 15.  Constipation.  Senokot S2 tabs twice daily, MiraLAX daily.     16.  Mild thrombocytopenia- stable no signs of hemorrhage , monitor while on enoxaparin  CBC Latest Ref Rng & Units 03/15/2021 03/10/2021 03/09/2021  WBC 4.0 - 10.5 K/uL 5.4 6.6 6.6  Hemoglobin 13.0 - 17.0 g/dL 12.2(L) 12.3(L) 11.7(L)  Hematocrit 39.0 - 52.0 % 36.5(L) 36.4(L) 34.9(L)  Platelets 150 - 400 K/uL 110(L) 113(L) 117(L)    LOS: 6 days A FACE TO FACE EVALUATION WAS PERFORMED  Charlett Blake 03/18/2021, 9:49 AM

## 2021-03-18 NOTE — Progress Notes (Addendum)
Speech Language Pathology Weekly Progress and Session Note  Patient Details  Name: [name] MRN: [id number] Date of Birth: June 12, 1930  Beginning of progress report period: March 13, 2021 End of progress report period: March 18, 2021  Today's Date: 03/18/2021 SLP Individual Time: 1115-1200 SLP Individual Time Calculation (min): 45 min  Short Term Goals: Week 1: SLP Short Term Goal 1 (Week 1): Pt will consume therapeutic trials of thin liquids with minimal overt s/s of aspiration and mod I use of swallowing precautions over 2 consecutive sessions prior to repeat instrumental assessment. SLP Short Term Goal 1 - Progress (Week 1): Progressing toward goal SLP Short Term Goal 2 (Week 1): Pt will utilize increased vocal intensity and overarticulation to achieve intelligibility at the conversational level with mod I SLP Short Term Goal 2 - Progress (Week 1): Met  New Short Term Goals: Week 2: SLP Short Term Goal 1 (Week 2): Pt will consume therapeutic trials of thin liquids with minimal overt s/s of aspiration and mod I use of swallowing precautions over 2 consecutive sessions prior to repeat instrumental assessment.  Weekly Progress Updates: Patient has made steady gains and has met 1 of 2 STGs this reporting period. Currently, patient demonstrates improved tolerance of PO trials (regular solids, thin liquids - water only) with minimal s/sx of aspiration and adherence to safe swallowing precautions with supervision assist verbal cues. He is currently consuming a dysphagia 3 diet and nectar thick liquids and has been approved for water protocol between meals (ice-chips only) following oral care in which pt is compliant with. Anticipate pt will be appropriate for repeat instrumental swallow assessment next week if he continues to tolerate thin liquid trials with minimal overt s/sx of aspiration. Patient has also demonstrated improvement with speech intelligibility and implements beneficial  strategies to improve speech clarity to achieve 100% intelligibility at conversation level with modified independence. Pt has met speech goals. Patient and family education is ongoing. Patient would benefit from continued skilled SLP intervention to maximize swallow functioning and safety.  Intensity: Minumum of 1-2 x/day, 30 to 90 minutes Frequency: 1 to 3 out of 7 days Duration/Length of Stay: 12/30 Treatment/Interventions: English as a second language teacher;Dysphagia/aspiration precaution training;Internal/external aids;Speech/Language facilitation;Patient/family education;Functional tasks  Daily Session Skilled Therapeutic Interventions: Skilled ST treatment focused on swallowing goals. Pt completed oral care with set-up A at sink prior to consumption of thin liquid trials. Pt consumed thin liquid (water) cup sips while implementing effortful swallow with supervision assist fading to modified independent for maintaining neutral head positioning. Pt with tendency to look down. Pt exhibited immediate cough x2, and occasional throat clearing with thin liquid trials. Also assessed tolerance with regular solids. Pt consumed with timely and effective mastication, oral clearance, and no overt s/sx of aspiration. Pt appears appropriate for regular trial tray as therapy schedule allows prior to diet advancement. Continue current diet at this time and continuation of ice chip protocol between meals. Pt continues to implement speech intelligibility strategies with modified independence to achieve 100% intelligiblity at conversation level. Speech ST and LTG met. Plan to primarily focus on swallowing goals at this time. Patient was left in his wheelchair with alarm activated and immediate needs within reach at end of session. Continue per current plan of care.      General    Pain  No pain  Therapy/Group: Individual Therapy  Patty Sermons 03/18/2021, 12:44 PM

## 2021-03-19 LAB — GLUCOSE, CAPILLARY
Glucose-Capillary: 188 mg/dL — ABNORMAL HIGH (ref 70–99)
Glucose-Capillary: 216 mg/dL — ABNORMAL HIGH (ref 70–99)
Glucose-Capillary: 275 mg/dL — ABNORMAL HIGH (ref 70–99)
Glucose-Capillary: 343 mg/dL — ABNORMAL HIGH (ref 70–99)

## 2021-03-19 MED ORDER — ALBUTEROL SULFATE (2.5 MG/3ML) 0.083% IN NEBU
2.5000 mg | INHALATION_SOLUTION | Freq: Four times a day (QID) | RESPIRATORY_TRACT | Status: DC | PRN
  Administered 2021-03-19: 2.5 mg via RESPIRATORY_TRACT
  Filled 2021-03-19: qty 3

## 2021-03-19 MED ORDER — INSULIN ASPART 100 UNIT/ML IJ SOLN
5.0000 [IU] | Freq: Once | INTRAMUSCULAR | Status: AC
Start: 2021-03-19 — End: 2021-03-19
  Administered 2021-03-19: 5 [IU] via SUBCUTANEOUS

## 2021-03-19 NOTE — Progress Notes (Signed)
PROGRESS NOTE   Subjective/Complaints:  Patient was noted to have some wheezing this morning per nursing but currently denies.  The patient did use a inhaler on a as needed basis at home. No pain complaints today no coughing.  ROS:  Pt denies increased SOB, abd pain, CP, N/V/C/D,    Objective:   No results found. No results for input(s): WBC, HGB, HCT, PLT in the last 72 hours.  No results for input(s): NA, K, CL, CO2, GLUCOSE, BUN, CREATININE, CALCIUM in the last 72 hours.    Intake/Output Summary (Last 24 hours) at 03/19/2021 1328 Last data filed at 03/19/2021 0720 Gross per 24 hour  Intake 118 ml  Output 800 ml  Net -682 ml         Physical Exam: Vital Signs Blood pressure 136/71, pulse 72, temperature 98.1 F (36.7 C), temperature source Oral, resp. rate 14, height 5\' 9"  (1.753 m), weight 81.9 kg, SpO2 97 %.  General: No acute distress Mood and affect are appropriate Heart: Regular rate and rhythm no rubs murmurs or extra sounds Lungs: Clear to auscultation, breathing unlabored, no rales or wheezes Abdomen: Positive bowel sounds, soft nontender to palpation, nondistended Extremities: No clubbing, cyanosis, or edema Skin: No evidence of breakdown, no evidence of rash  Musculoskeletal:     Cervical back: Normal range of motion and neck supple.     Comments: Full ROM, No pain with AROM or PROM in the neck, trunk, or extremities. Posture appropriate   Skin:    General: Skin is warm and dry.  Neurological:     Mental Status: He is alert.     Comments: Patient is alert.  Makes eye contact with examiner.  He was able to provide his name place, month, year, why he's here.  Follows basic commands. Left central 7 and tongue deviation. LUE 2/5 deltoid, 3/5 bicep, 2+ tricep, 1+ to 2- wrist/fingers. LLE 2+ to 3/5. RUE and RLE 4 to 4+/5 prox to distal. Sensory exam normal for light touch and pain in all 4 limbs. No  limb ataxia or cerebellar signs. No abnormal tone appreciated.  No abnormal tone   Assessment/Plan: 1. Functional deficits which require 3+ hours per day of interdisciplinary therapy in a comprehensive inpatient rehab setting. Physiatrist is providing close team supervision and 24 hour management of active medical problems listed below. Physiatrist and rehab team continue to assess barriers to discharge/monitor patient progress toward functional and medical goals  Care Tool:  Bathing    Body parts bathed by patient: Left lower leg, Face, Left arm, Chest, Abdomen, Right upper leg, Buttocks, Front perineal area, Left upper leg, Right lower leg   Body parts bathed by helper: Buttocks, Right arm     Bathing assist Assist Level: Moderate Assistance - Patient 50 - 74%     Upper Body Dressing/Undressing Upper body dressing   What is the patient wearing?: Button up shirt, Hospital gown only    Upper body assist Assist Level: Moderate Assistance - Patient 50 - 74%    Lower Body Dressing/Undressing Lower body dressing      What is the patient wearing?: Underwear/pull up, Pants     Lower body  assist Assist for lower body dressing: Moderate Assistance - Patient 50 - 74%     Toileting Toileting    Toileting assist Assist for toileting: Dependent - Patient 0% (w/ stedy)     Transfers Chair/bed transfer  Transfers assist     Chair/bed transfer assist level: Minimal Assistance - Patient > 75% Chair/bed transfer assistive device: Walker, Clinical biochemist   Ambulation assist   Ambulation activity did not occur: Safety/medical concerns  Assist level: Minimal Assistance - Patient > 75% Assistive device: Walker-rolling Max distance: 112ft   Walk 10 feet activity   Assist  Walk 10 feet activity did not occur: Safety/medical concerns        Walk 50 feet activity   Assist Walk 50 feet with 2 turns activity did not occur: Safety/medical concerns          Walk 150 feet activity   Assist Walk 150 feet activity did not occur: Safety/medical concerns         Walk 10 feet on uneven surface  activity   Assist Walk 10 feet on uneven surfaces activity did not occur: Safety/medical concerns         Wheelchair     Assist Is the patient using a wheelchair?: Yes Type of Wheelchair: Manual    Wheelchair assist level: Dependent - Patient 0%      Wheelchair 50 feet with 2 turns activity    Assist        Assist Level: Dependent - Patient 0%   Wheelchair 150 feet activity     Assist      Assist Level: Dependent - Patient 0%   Blood pressure 136/71, pulse 72, temperature 98.1 F (36.7 C), temperature source Oral, resp. rate 14, height 5\' 9"  (1.753 m), weight 81.9 kg, SpO2 97 %.  Medical Problem List and Plan: 1.  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  Antithrombotics: -DVT/anticoagulation:  Pharmaceutical: Lovenox             -antiplatelet therapy: Aspirin 81 mg daily and Plavix 75 mg day x3 weeks then aspirin alone.  Plavix can be discontinued after last dose 03/29/2021 3. Pain Management: Tylenol as needed 4. Mood/dementia: Aricept 10 mg nightly, Zoloft 100 mg daily             -antipsychotic agents: N/A 5. Neuropsych: This patient is capable of making decisions on his own behalf. 6. Skin/Wound Care: Routine skin checks 7. Fluids/Electrolytes/Nutrition: Routine in and outs with follow-up chemistries 8.  Diabetes mellitus.  Hemoglobin A1c 8.3.  Semglee just adjusted to 10 units daily.  Check blood sugars before meals and at bedtime             -cbg's poorly controlled at present.  CBG (last 3)  Recent Labs    03/18/21 2137 03/19/21 0618 03/19/21 1202  GLUCAP 194* 188* 216*   Elevated , increase Semglee to  20U   9.  Dysphagia.  Dysphagia #3 nectar thick liquid diet.  Advance per speech therapy  12/14 should be ready for repeat swallow study  10.  CKD stage IIIa.  Baseline creatinine 1.3-1.8.  12/10- last Cr 1.41 down from 1.60 2 days prior- will monitor   BMP  Latest Ref Rng & Units 03/15/2021 03/11/2021 03/10/2021  Glucose 70 - 99 mg/dL 213(H) 170(H) 186(H)  BUN 8 - 23 mg/dL 25(H) 22 23  Creatinine 0.61 - 1.24 mg/dL 1.43(H) 1.41(H) 1.54(H)  Sodium 135 - 145 mmol/L 139 135 138  Potassium 3.5 - 5.1 mmol/L 4.0 4.4 4.7  Chloride 98 - 111 mmol/L 104 105 104  CO2 22 - 32 mmol/L 27 25 24   Calcium 8.9 - 10.3 mg/dL 8.7(L) 8.7(L) 8.6(L)   03/18/21 stable  11.  Macrocytic anemia/vitamin B12 deficiency.   -b12 injections x 5 days (thru 12/11)-->oral supplementation -Continue iron supplement for mild iron def anemia 12.  History of prostate cancer with prostatectomy.  Follow-up outpatient. 38.  History of adenocarcinoma left lung with resection 2001.  Follow-up outpatient  12/10- pt usually wheezes per daughter- is chronic for him- s/p lobectomy.   12/11- sounds stable- decreased in LUL-  14.  Hyperlipidemia.  Lipitor 15.  Constipation.  Senokot S2 tabs twice daily, MiraLAX daily.     16.  Mild thrombocytopenia- stable no signs of hemorrhage , monitor while on enoxaparin  CBC Latest Ref Rng & Units 03/15/2021 03/10/2021 03/09/2021  WBC 4.0 - 10.5 K/uL 5.4 6.6 6.6  Hemoglobin 13.0 - 17.0 g/dL 12.2(L) 12.3(L) 11.7(L)  Hematocrit 39.0 - 52.0 % 36.5(L) 36.4(L) 34.9(L)  Platelets 150 - 400 K/uL 110(L) 113(L) 117(L)    LOS: 7 days A FACE TO FACE EVALUATION WAS PERFORMED  Charlett Blake 03/19/2021, 1:28 PM

## 2021-03-19 NOTE — Progress Notes (Addendum)
Occupational Therapy Session Note  Patient Details  Name: [name] MRN: [id number] Date of Birth: [birth year]  Today's Date: 03/19/2021 OT Individual Time: 0900-0930 OT Individual Time Calculation (min): 30 min    Short Term Goals: Week 2:  OT Short Term Goal 1 (Week 2): Pt will don shirt with min A. OT Short Term Goal 2 (Week 2): Pt will donn his LB clothing at min assist level excluding AFO. OT Short Term Goal 3 (Week 2): Pt will use the LUE at a diminshed level for washing the RUE with min facilitation. OT Short Term Goal 4 (Week 2): Pt will complete toilet transfers and toileting tasks with min assist sit to stand.  Skilled Therapeutic Interventions/Progress Updates:    Pt sleeping in bed upon arrival but easily aroused. OT intervention with focus on dressing with sit<>stand from w/c at sink. Supine>sit EOB with supervision. Sit<>stand with min A. Pt requested use of Stedy for transfer to w/c. Min A for LB dressing. Supervision for UB dressing. Pt remained in w/c with all needs within reach. Belt alarm activated. Half lap tray in place.  Therapy Documentation Precautions:  Precautions Precautions: Fall, Other (comment) Precaution Comments: L hemi, posterior bias Restrictions Weight Bearing Restrictions: No   Pain:  Pt denies pain this morning   Therapy/Group: Individual Therapy  Leroy Libman 03/19/2021, 9:36 AM

## 2021-03-19 NOTE — Progress Notes (Signed)
Physical Therapy Weekly Progress Note  Patient Details  Name: [name] MRN: [id number] Date of Birth: 08-02-1930  Beginning of progress report period: March 13, 2021 End of progress report period: March 20, 2021  Patient has met 4 of 4 short term goals. [name] is progressing well with therapy demonstrating increasing independence with functional mobility. He is performing supine<>sit with min assist, sit<>stand and stand pivot transfers using RW with min assist, and ambulating up to 17f using RW with min assist while wearing AFO. Pt continues to demonstrate L hemiplegia, impaired balance,and impaired coordination contributing to his need for assistance. Pt will benefit from continued CIR level therapies to further progress his functional mobility to reach supervision/CGA level goals prior to D/C home with 24hr support.  Patient continues to demonstrate the following deficits muscle weakness and muscle paralysis, decreased cardiorespiratoy endurance, impaired timing and sequencing, abnormal tone, unbalanced muscle activation, and decreased coordination, decreased motor planning, and decreased standing balance, decreased postural control, hemiplegia, and decreased balance strategies and therefore will continue to benefit from skilled PT intervention to increase functional independence with mobility.  Patient progressing toward long term goals..  Continue plan of care.  PT Short Term Goals Week 1:  PT Short Term Goal 1 (Week 1): Pt will perform supine<>sit with min assist PT Short Term Goal 1 - Progress (Week 1): Met PT Short Term Goal 2 (Week 1): Pt will perform sit<>stand with min assist using LRAD PT Short Term Goal 2 - Progress (Week 1): Met PT Short Term Goal 3 (Week 1): Pt will perform bed<>chair transfer using LRAD with min assist PT Short Term Goal 3 - Progress (Week 1): Met PT Short Term Goal 4 (Week 1): Pt will ambulate at least 561fusing LRAD with mod assist of  1 PT Short Term Goal 4 - Progress (Week 1): Met PT Short Term Goal 5 (Week 1): Pt will demonstrate improvement in Berg Balance Test by at least 7 points PT Short Term Goal 5 - Progress (Week 1): Met Week 2:  PT Short Term Goal 1 (Week 2): Patient will complete bed mobility with CGA PT Short Term Goal 2 (Week 2): Patient will improve Berg score by at least 7 pointts PT Short Term Goal 3 (Week 2): Patient will ambulate >5071fith MinA  Skilled Therapeutic Interventions/Progress Updates:  Ambulation/gait training;Community reintegration;DME/adaptive equipment instruction;Neuromuscular re-education;Psychosocial support;Stair training;UE/LE Strength taining/ROM;Wheelchair propulsion/positioning;Balance/vestibular training;Discharge planning;Functional electrical stimulation;Pain management;Skin care/wound management;Therapeutic Activities;UE/LE Coordination activities;Cognitive remediation/compensation;Disease management/prevention;Functional mobility training;Patient/family education;Splinting/orthotics;Therapeutic Exercise;Visual/perceptual remediation/compensation    Therapy Documentation Precautions:  Precautions Precautions: Fall, Other (comment) Precaution Comments: L hemi, posterior bias Restrictions Weight Bearing Restrictions: No    Therapy/Group: Individual Therapy  Carly M PFrancis DowsePT, DPT, NCS, CSRS JenDebbora DusT, DPT, CBIS  03/19/2021, 1:04 PM

## 2021-03-19 NOTE — Progress Notes (Signed)
Physical Therapy Session Note  Patient Details  Name: [name] MRN: [id number] Date of Birth: [birth year]  Today's Date: 03/19/2021 PT Individual Time: 1751-0258 PT Individual Time Calculation (min): 61 min   Short Term Goals: Week 1:  PT Short Term Goal 1 (Week 1): Pt will perform supine<>sit with min assist PT Short Term Goal 1 - Progress (Week 1): Met PT Short Term Goal 2 (Week 1): Pt will perform sit<>stand with min assist using LRAD PT Short Term Goal 2 - Progress (Week 1): Met PT Short Term Goal 3 (Week 1): Pt will perform bed<>chair transfer using LRAD with min assist PT Short Term Goal 3 - Progress (Week 1): Met PT Short Term Goal 4 (Week 1): Pt will ambulate at least 53f using LRAD with mod assist of 1 PT Short Term Goal 4 - Progress (Week 1): Met PT Short Term Goal 5 (Week 1): Pt will demonstrate improvement in Berg Balance Test by at least 7 points PT Short Term Goal 5 - Progress (Week 1): Other (comment)  Skilled Therapeutic Interventions/Progress Updates:  Pt received supine in bed with his son present and pt agreeable to therapy session. Supine>sitting L EOB, HOB partially elevated and using bedrails as pt has hospital bed at home, with GCA for safety. Sitting EOB donned tennis shoes and L LE AFO total assist for time (no trunk LOB during this). Reinforced education from prior PT to family requesting regular laced tennis shoes in 1/2 size larger for improved AFO fit. Sit>stand EOB>RW with min assist to rise to stand and balance - pt placing L hand on RW orthosis without cuing or assist (ready to trial without it in future sessions due to improving grasp strength). R stand pivot to w/c using RW with min assist for balance - pt able to manage AD.  Transported to/from gym in w/c for time management and energy conservation.   Pt agreeable to gait training on treadmill with min encouragement stating it really wore him out last time.   Stepped on/off treadmill using B UE  support on litegait rails with min assist for balance - cuing for stepping up with RLE and down with L LE. Standing with B UE support on litegait and CGA for steadying, donned harness.   Gait training on treadmill in litegait harness to provide balance support but no true body weight support using B UE support for the trials as follows:  - 1st: 113m29 sec at 0.4m22mincreased to 0.7mp59motaling 85ft7fherapist facilitating R/L weight shifting with verbal and visual cuing for increased R step length as pt has short L stance time causing quick, short R swing phase advancement - pt able to advance L LE without assist today! - 2nd: 1min331mc at 0.7mph i45meased to 0.9mph to25ming 112ft - r42fres more facilitation for R/L weight shift onto stance limb at this increased speed as well as facilitation at L hip for increased hip flexion to clear foot and advance L LE during swing - starts to have increased toe drag during swing especially towards end with fatigue  - 3rd: 1min30sec32m 0.9 increased to 1.0mph total75m 125ft - requ22f mod assist to advance L LE >75% of the time this trial due to fatigue and decreasing ability to clear L foot - continued to provide facilitation for R/L weight shifting onto stance limb as able - continued to require repeated verbal cuing to increase R LE step length to achieve a reciprocal pattern as opposed to a step-to  Pt becomes fatigued at ~51mn30sec during gait training on treadmill requiring seated rest breaks between trials. Pt reports overall he is less fatigued after this session of gait training on treadmill compared to 2 days ago despite ambulating at faster speeds and longer distances.  Doffed harness and stepped down off treadmill as described above.   Transported back to room and left seated in w/c with needs in reach, seat belt alarm on, family present, and nurse present to assume care of patient.  Therapy Documentation Precautions:  Precautions Precautions:  Fall, Other (comment) Precaution Comments: L hemi, posterior bias Restrictions Weight Bearing Restrictions: No   Pain: Denies pain during session.    Therapy/Group: Individual Therapy  CTawana Scale, PT, DPT, NCS, CSRS  03/19/2021, 5:12 PM

## 2021-03-19 NOTE — Progress Notes (Incomplete)
Patient remains stable. Had a quite night. No new changes to report. CBG monitored as per protocol. Safety ensured at all times

## 2021-03-19 NOTE — Progress Notes (Signed)
Physical Therapy Session Note  Patient Details  Name: [name] MRN: [id number] Date of Birth: Jul 08, 1930  Today's Date: 03/19/2021 PT Individual Time: 1330-1425 PT Individual Time Calculation (min): 55 min   Short Term Goals: Week 1:  PT Short Term Goal 1 (Week 1): Pt will perform supine<>sit with min assist PT Short Term Goal 2 (Week 1): Pt will perform sit<>stand with min assist using LRAD PT Short Term Goal 3 (Week 1): Pt will perform bed<>chair transfer using LRAD with min assist PT Short Term Goal 4 (Week 1): Pt will ambulate at least 44ft using LRAD with mod assist of 1 PT Short Term Goal 5 (Week 1): Pt will demonstrate improvement in Berg Balance Test by at least 7 points  Skilled Therapeutic Interventions/Progress Updates:    Patient received sitting up in bed, family at bedside, agreeable to PT. He denies pain. Patient able to come sit edge of bed with supervision and HOB elevated. PT donning shoes + AFO TotalA for time. Discussed with patient and family using sneakers with laces instead of slip on house shoes for increased stability and full benefit from use of AFO. Family verbalized understanding and noted that he would bring in sneakers at some point. MinA stand pivot to wc. PT transporting patient in wc to therapy gym for time management and energy conservation. He ambulated 2x80 ft with RW and MinA. Noted ataxia in R LE, intermittent scissoring, decreased R foot clearance. Responsive to verbal cues to modify gait, but limited in endurance for maintaining modifications. Orthotist present for AFO consult- in agreement that patient would benefit from use of AFO and sneakers. Patient ambulating additional 2x28ft with RW and 3# ankle weight to R LE for improved proprioceptive input. Slight improvement in ataxia. Would benefit from toe cap to help prevent tripping due to decreased R foot clearance through swing phase. Modified single leg stance task with U UE support matching playing  cards- MinA provided. L LE with poor endurance for maintaining posture- able to remain standing for ~2 mins before needing to sit. Patient ambulating final 147ft with RW and MinA to his room. Supervision to return supine. Bed alarm on, call light within reach.   Therapy Documentation Precautions:  Precautions Precautions: Fall, Other (comment) Precaution Comments: L hemi, posterior bias Restrictions Weight Bearing Restrictions: No    Therapy/Group: Individual Therapy  Karoline Caldwell, PT, DPT, CBIS  03/19/2021, 7:31 AM

## 2021-03-19 NOTE — Progress Notes (Signed)
Occupational Therapy Session Note  Patient Details  Name: [name] MRN: [id number] Date of Birth: 1930-11-06  Today's Date: 03/19/2021 OT Individual Time: 1100-1201 OT Individual Time Calculation (min): 61 min    Short Term Goals: Week 2:  OT Short Term Goal 1 (Week 2): Pt will don shirt with min A. OT Short Term Goal 2 (Week 2): Pt will donn his LB clothing at min assist level excluding AFO. OT Short Term Goal 3 (Week 2): Pt will use the LUE at a diminshed level for washing the RUE with min facilitation. OT Short Term Goal 4 (Week 2): Pt will complete toilet transfers and toileting tasks with min assist sit to stand.  Skilled Therapeutic Interventions/Progress Updates:  Pt greeted seated in w/c  agreeable to OT intervention. Session focus on  functional mobility, LUE AROM and increasing overall activity tolerance. Pt transported to gym with total A where session focused on [facility] with LUE and functional reach. Pt  completed grasp and release therapeutic activity with compliant cubes with LUE with pt using intermittent hand over hand assist to grasp all 10 cubes with LUE and place in cup positioned on pts L side. Pt mostly using first and thumb to grasp items. Graded task up and worked grip strength with theraputty with pt able to roll out putty, pinch putty with first and second digit as well as flatten putty with LUE. Encouraged pt to work on grasping cubes as well as theraputty in his room.  Worked on [employer] and GM movement with LUE with OTA placing 3lb weighted ball in pts L hand with R hand positioned on top of it. Pt instructed to supinate/pronate wrist to allow for improved GM coordination as well as NMR to LUE. Pt completed 2x10 reps as well as chest presses with hand over hand assist for 2x10 reps. Worked on dynamic standing balance with pt able to stand with MIN A from EOM and reach dynamically with LUE to retrieve clothespins, horseshoes and cones from table in front with pt needing  intermittent MIN A   for functional grasp ( most assist needed for clothespins). Pt completed additional stand pivot back to w/c with [name] with RW. Pt transported back to room with totalA where pt request need to toilet, MIN A for stand pivot from w/c>toilet with use of grab bars. MOD A for 3/3 toileting tasks needing assist for clothing mgmt and balance assist in standing while pt completed posterior pericare in standing. Pt transfer back to w/c with MIN A. Hand hygiene completed at sink MODI. MIN A for stand pivot back to bed with rw. Sit>supine with CGA.          pt left  supine  in bed with all needs within reach.                      Therapy Documentation Precautions:  Precautions Precautions: Fall, Other (comment) Precaution Comments: L hemi, posterior bias Restrictions Weight Bearing Restrictions: No  Pain: no pain reported during session     Therapy/Group: Individual Therapy  Precious Haws 03/19/2021, 12:28 PM

## 2021-03-20 LAB — GLUCOSE, CAPILLARY
Glucose-Capillary: 163 mg/dL — ABNORMAL HIGH (ref 70–99)
Glucose-Capillary: 196 mg/dL — ABNORMAL HIGH (ref 70–99)
Glucose-Capillary: 227 mg/dL — ABNORMAL HIGH (ref 70–99)
Glucose-Capillary: 181 mg/dL — ABNORMAL HIGH (ref 70–99)

## 2021-03-20 MED ORDER — INSULIN GLARGINE-YFGN 100 UNIT/ML ~~LOC~~ SOLN
20.0000 [IU] | Freq: Every day | SUBCUTANEOUS | Status: DC
  Administered 2021-03-21 – 2021-03-22 (×2): 20 [IU] via SUBCUTANEOUS
  Filled 2021-03-20 (×2): qty 0.2

## 2021-03-20 MED ORDER — INSULIN ASPART 100 UNIT/ML IJ SOLN
3.0000 [IU] | Freq: Three times a day (TID) | INTRAMUSCULAR | Status: DC
Start: 2021-03-20 — End: 2021-03-23
  Administered 2021-03-20 – 2021-03-23 (×9): 3 [IU] via SUBCUTANEOUS

## 2021-03-20 NOTE — Progress Notes (Signed)
Speech Language Pathology Daily Session Note  Patient Details  Name: [name] MRN: [id number] Date of Birth: Feb 13, 1931  Today's Date: 03/20/2021 SLP Individual Time: 1445-1530 SLP Individual Time Calculation (min): 45 min  Short Term Goals: Week 2: SLP Short Term Goal 1 (Week 2): Pt will consume therapeutic trials of thin liquids with minimal overt s/s of aspiration and mod I use of swallowing precautions over 2 consecutive sessions prior to repeat instrumental assessment.  Skilled Therapeutic Interventions:  Patient seen for skilled ST session focused on swallow function goals. He was agreeable to consuming ice chips which he consumed with minimal intensity of throat clearing but no other overt s/s aspiration or penetration. Voice remained clear and strong. Patient also demonstrating very good recall and carryover of learned swallow and speech strategies and is not impulsive but is cautious when consuming ice chips. His daughter and granddaughter arrived during session and daughter had some questions regarding his diet. After discussion with daughter and review of patient's previous ST session notes, this SLP in agreement with upgrading patient's solid food textures from Dys 3 to regular. In addition, SLP indicated in writing in diet order as well as swallow safety signs in room that patient's family is cleared to be able to thicken his liquids to nectar thick consistency. Patient himself has been reportedly good about telling his daughter or staff if liquids are too thick or not thick enough. Patient left in bed with family in room and all needs within reach. He continues to benefit from skilled SLP intervention to maximize swallow function prior to discharge.  Pain Pain Assessment Pain Scale: 0-10 Pain Score: 0-No pain  Therapy/Group: Individual Therapy  Sonia Baller, MA, CCC-SLP Speech Therapy

## 2021-03-20 NOTE — Progress Notes (Signed)
Physical Therapy Session Note  Patient Details  Name: [name] MRN: [id number] Date of Birth: 1930-08-17  Today's Date: 03/20/2021 PT Individual Time: 1448-1856; 1340-1410 PT Individual Time Calculation (min): 25 min and 30 mins  Short Term Goals: Week 1:  PT Short Term Goal 1 (Week 1): Pt will perform supine<>sit with min assist PT Short Term Goal 1 - Progress (Week 1): Met PT Short Term Goal 2 (Week 1): Pt will perform sit<>stand with min assist using LRAD PT Short Term Goal 2 - Progress (Week 1): Met PT Short Term Goal 3 (Week 1): Pt will perform bed<>chair transfer using LRAD with min assist PT Short Term Goal 3 - Progress (Week 1): Met PT Short Term Goal 4 (Week 1): Pt will ambulate at least 52f using LRAD with mod assist of 1 PT Short Term Goal 4 - Progress (Week 1): Met PT Short Term Goal 5 (Week 1): Pt will demonstrate improvement in Berg Balance Test by at least 7 points PT Short Term Goal 5 - Progress (Week 1): Met   Skilled Therapeutic Interventions/Progress Updates:    Session 1: Patient received supine in bed, agreeable to PT. He denies pain. Able to come sit edge of bed with MinA. Donning pants with MinA. Standing with grossly ModA due to posterior bias. Patient initially with difficulty obtaining balance. MinA stand pivot to wc with no AD. PT transporting patient in wc to therapy gym for time management and energy conservation. He completed the Berg balance scale and scored a 17/56 indicating a high risk for falling and benefit from use of AD. Discussed findings with patient and he vebralized understanding. He was able to ambulate back to his room with RW and MinA/ModA. L LE ataxia and poor power modulation noted sometimes kicking L front wheel. Patient returning to bed. Bed alarm on, call light within reach.   Session 2: Patient received supine in bed, agreeable to PT. He denies pain. Able to come sit edge of bed with MinA. PT donning sneakers + AFO TotalA for time.  MinA stand pivot to wc. PT transporting patient in wc to therapy gym for time management and energy conservation. He completed 4x2 mins on NuStep with extended rest break between due to fatigue and SOB. Wheezing noted upon exertion, but would subside with rest. O2 98-100% on RA, HR 88-100BPM. Patient returning to room in wc, [place] back to bed. Bed alarm on, call light within reach.   Therapy Documentation Precautions:  Precautions Precautions: Fall, Other (comment) Precaution Comments: L hemi, posterior bias Restrictions Weight Bearing Restrictions: No   Therapy/Group: Individual Therapy  JKaroline Caldwell PT, DPT, CBIS  03/20/2021, 7:33 AM

## 2021-03-20 NOTE — Progress Notes (Signed)
HS CBG=343. Paged Dr. Letta Pate, orders to give 5 units of novolog and change diet to carb modified. PRN tylenol given at 2035 for generalized discomfort. Bilateral heels elevated off bed with pillows. LUE elevated on pillow. [name] A

## 2021-03-20 NOTE — Progress Notes (Signed)
PROGRESS NOTE   Subjective/Complaints:  Called by nursing regarding blood sugars, no breathing issues today Patient's daughter is at bedside no concerns Discussed changes to diabetic management. ROS:  Pt denies increased SOB, abd pain, CP, N/V/C/D,    Objective:   No results found. No results for input(s): WBC, HGB, HCT, PLT in the last 72 hours.  No results for input(s): NA, K, CL, CO2, GLUCOSE, BUN, CREATININE, CALCIUM in the last 72 hours.    Intake/Output Summary (Last 24 hours) at 03/20/2021 1154 Last data filed at 03/20/2021 6433 Gross per 24 hour  Intake 594 ml  Output 600 ml  Net -6 ml         Physical Exam: Vital Signs Blood pressure (!) 146/65, pulse 69, temperature 97.9 F (36.6 C), temperature source Oral, resp. rate 16, height 5\' 9"  (1.753 m), weight 88.4 kg, SpO2 97 %.   General: No acute distress Mood and affect are appropriate Heart: Regular rate and rhythm no rubs murmurs or extra sounds Lungs: Clear to auscultation, breathing unlabored, no rales or wheezes Abdomen: Positive bowel sounds, soft nontender to palpation, nondistended Extremities: No clubbing, cyanosis, or edema Skin: No evidence of breakdown, no evidence of rash   Musculoskeletal:     Cervical back: Normal range of motion and neck supple.     Comments: Full ROM, No pain with AROM or PROM in the neck, trunk, or extremities. Posture appropriate   Skin:    General: Skin is warm and dry.  Neurological:     Mental Status: He is alert.     Comments: Patient is alert.  Makes eye contact with examiner.  He was able to provide his name place, month, year, why he's here.  Follows basic commands. Left central 7 and tongue deviation. LUE 2/5 deltoid, 3/5 bicep, 2+ tricep, 1+ to 2- wrist/fingers. LLE 2+ to 3/5. RUE and RLE 4 to 4+/5 prox to distal. Sensory exam normal for light touch and pain in all 4 limbs. No limb ataxia or cerebellar  signs. No abnormal tone appreciated.  No abnormal tone   Assessment/Plan: 1. Functional deficits which require 3+ hours per day of interdisciplinary therapy in a comprehensive inpatient rehab setting. Physiatrist is providing close team supervision and 24 hour management of active medical problems listed below. Physiatrist and rehab team continue to assess barriers to discharge/monitor patient progress toward functional and medical goals  Care Tool:  Bathing    Body parts bathed by patient: Left lower leg, Face, Left arm, Chest, Abdomen, Right upper leg, Buttocks, Front perineal area, Left upper leg, Right lower leg   Body parts bathed by helper: Buttocks, Right arm     Bathing assist Assist Level: Moderate Assistance - Patient 50 - 74%     Upper Body Dressing/Undressing Upper body dressing   What is the patient wearing?: Button up shirt, Hospital gown only    Upper body assist Assist Level: Moderate Assistance - Patient 50 - 74%    Lower Body Dressing/Undressing Lower body dressing      What is the patient wearing?: Underwear/pull up, Pants     Lower body assist Assist for lower body dressing: Moderate Assistance - Patient  92 - 74%     Toileting Toileting    Toileting assist Assist for toileting: Dependent - Patient 0% (w/ stedy)     Transfers Chair/bed transfer  Transfers assist     Chair/bed transfer assist level: Minimal Assistance - Patient > 75% Chair/bed transfer assistive device: Armrests, Walker   Locomotion Ambulation   Ambulation assist   Ambulation activity did not occur: Safety/medical concerns  Assist level: Moderate Assistance - Patient 50 - 74% Assistive device: Lite Gait Max distance: 141ft   Walk 10 feet activity   Assist  Walk 10 feet activity did not occur: Safety/medical concerns        Walk 50 feet activity   Assist Walk 50 feet with 2 turns activity did not occur: Safety/medical concerns         Walk 150 feet  activity   Assist Walk 150 feet activity did not occur: Safety/medical concerns         Walk 10 feet on uneven surface  activity   Assist Walk 10 feet on uneven surfaces activity did not occur: Safety/medical concerns         Wheelchair     Assist Is the patient using a wheelchair?: Yes Type of Wheelchair: Manual    Wheelchair assist level: Dependent - Patient 0%      Wheelchair 50 feet with 2 turns activity    Assist        Assist Level: Dependent - Patient 0%   Wheelchair 150 feet activity     Assist      Assist Level: Dependent - Patient 0%   Blood pressure (!) 146/65, pulse 69, temperature 97.9 F (36.6 C), temperature source Oral, resp. rate 16, height 5\' 9"  (1.753 m), weight 88.4 kg, SpO2 97 %.  Medical Problem List and Plan: 1.  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  Antithrombotics: -DVT/anticoagulation:  Pharmaceutical: Lovenox             -antiplatelet therapy: Aspirin 81 mg daily and Plavix 75 mg day x3 weeks then aspirin alone.  Plavix can be discontinued after last dose 03/29/2021 3. Pain Management: Tylenol as needed 4. Mood/dementia: Aricept 10 mg nightly, Zoloft 100 mg daily             -antipsychotic agents: N/A 5. Neuropsych: This patient is capable of making decisions on his own behalf. 6. Skin/Wound Care: Routine skin checks 7. Fluids/Electrolytes/Nutrition: Routine in and outs with follow-up chemistries 8.  Diabetes mellitus.  Hemoglobin A1c 8.3.  Semglee just adjusted to 10 units daily.  Check blood sugars before meals and at bedtime             -cbg's poorly controlled at present.  CBG (last 3)  Recent Labs    03/19/21 2034 03/20/21 0610 03/20/21 1147  GLUCAP 343* 163* 196*   Elevated , increase Semglee to 20U  Patient on  metformin at home however elevated creatinine limits usage.  We will start low-dose NovoLog with meals  9.  Dysphagia.  Dysphagia #3 nectar thick liquid diet.  Advance per speech therapy  12/14 should be ready for repeat swallow study  10.  CKD stage IIIa.  Baseline creatinine 1.3-1.8.  12/10- last Cr 1.41  down from 1.60 2 days prior- will monitor   BMP Latest Ref Rng & Units 03/15/2021 03/11/2021 03/10/2021  Glucose 70 - 99 mg/dL 213(H) 170(H) 186(H)  BUN 8 - 23 mg/dL 25(H) 22 23  Creatinine 0.61 - 1.24 mg/dL 1.43(H) 1.41(H) 1.54(H)  Sodium 135 - 145 mmol/L 139 135 138  Potassium 3.5 - 5.1 mmol/L 4.0 4.4 4.7  Chloride 98 - 111 mmol/L 104 105 104  CO2 22 - 32 mmol/L 27 25 24   Calcium 8.9 - 10.3 mg/dL 8.7(L) 8.7(L) 8.6(L)   03/18/21 stable  11.  Macrocytic anemia/vitamin B12 deficiency.   -b12 injections x 5 days (thru 12/11)-->oral supplementation -Continue iron supplement for mild iron def anemia 12.  History of prostate cancer with prostatectomy.  Follow-up outpatient. 51.  History of adenocarcinoma left lung with resection 2001.  Follow-up outpatient  12/10- pt usually wheezes per daughter- is chronic for him- s/p lobectomy.   12/11- sounds stable- decreased in LUL-  14.  Hyperlipidemia.  Lipitor 15.  Constipation.  Senokot S2 tabs twice daily, MiraLAX daily.     16.  Mild thrombocytopenia- stable no signs of hemorrhage , monitor while on enoxaparin  CBC Latest Ref Rng & Units 03/15/2021 03/10/2021 03/09/2021  WBC 4.0 - 10.5 K/uL 5.4 6.6 6.6  Hemoglobin 13.0 - 17.0 g/dL 12.2(L) 12.3(L) 11.7(L)  Hematocrit 39.0 - 52.0 % 36.5(L) 36.4(L) 34.9(L)  Platelets 150 - 400 K/uL 110(L) 113(L) 117(L)    LOS: 8 days A FACE TO FACE EVALUATION WAS PERFORMED  Charlett Blake 03/20/2021, 11:54 AM

## 2021-03-21 LAB — GLUCOSE, CAPILLARY
Glucose-Capillary: 187 mg/dL — ABNORMAL HIGH (ref 70–99)
Glucose-Capillary: 170 mg/dL — ABNORMAL HIGH (ref 70–99)
Glucose-Capillary: 180 mg/dL — ABNORMAL HIGH (ref 70–99)
Glucose-Capillary: 255 mg/dL — ABNORMAL HIGH (ref 70–99)

## 2021-03-22 ENCOUNTER — Encounter: Payer: Medicare HMO | Admitting: Family Medicine

## 2021-03-22 DIAGNOSIS — N1831 Chronic kidney disease, stage 3a: Secondary | ICD-10-CM

## 2021-03-22 LAB — GLUCOSE, CAPILLARY
Glucose-Capillary: 231 mg/dL — ABNORMAL HIGH (ref 70–99)
Glucose-Capillary: 144 mg/dL — ABNORMAL HIGH (ref 70–99)
Glucose-Capillary: 189 mg/dL — ABNORMAL HIGH (ref 70–99)
Glucose-Capillary: 150 mg/dL — ABNORMAL HIGH (ref 70–99)

## 2021-03-22 MED ORDER — INSULIN GLARGINE-YFGN 100 UNIT/ML ~~LOC~~ SOLN
5.0000 [IU] | Freq: Once | SUBCUTANEOUS | Status: AC
  Administered 2021-03-22: 13:00:00 5 [IU] via SUBCUTANEOUS
  Filled 2021-03-22: qty 0.05

## 2021-03-22 MED ORDER — TAMSULOSIN HCL 0.4 MG PO CAPS
0.4000 mg | ORAL_CAPSULE | Freq: Every day | ORAL | Status: DC
  Administered 2021-03-22 – 2021-04-01 (×11): 0.4 mg via ORAL
  Filled 2021-03-22 (×11): qty 1

## 2021-03-22 MED ORDER — INSULIN GLARGINE-YFGN 100 UNIT/ML ~~LOC~~ SOLN
25.0000 [IU] | Freq: Every day | SUBCUTANEOUS | Status: DC
  Administered 2021-03-23 – 2021-04-02 (×11): 25 [IU] via SUBCUTANEOUS
  Filled 2021-03-22 (×11): qty 0.25

## 2021-03-22 NOTE — Progress Notes (Signed)
Slept good. Incontinent of urine X 1 this shift. Decreased dexterity to left hand, has difficulty manipulating brief and placing urinal. PRN tylenol given at 2103. [name] A

## 2021-03-22 NOTE — Progress Notes (Signed)
Physical Therapy Session Note  Patient Details  Name: [name] MRN: [id number] Date of Birth: [birth year]  Today's Date: 03/22/2021 PT Individual Time: 1100-1155 PT Individual Time Calculation (min): 55 min   Short Term Goals: Week 2:  PT Short Term Goal 1 (Week 2): Patient will complete bed mobility with CGA PT Short Term Goal 2 (Week 2): Patient will improve Berg score by at least 7 pointts PT Short Term Goal 3 (Week 2): Patient will ambulate >75ft with MinA  Skilled Therapeutic Interventions/Progress Updates:    Patient received reclined in be, agreeable to PT. He denies pain. Able to come sit edge of bed with supervision. PT donning socks/shoes/AFO TotalA for time. He was able to transfer to wc via stand pivot with MinA. PT transporting patient in wc to therapy gym for time management and energy conservation. He ambulated 35ft with RW and MinA. Noted L LE ataxia and uncontrolled inversion/eversion of L foot. Added 3# ankle weight and ambulate 110ft with MinA. Noted improvement of ataxia. He remains with poor foot clearance L LE requiring verbal cues to increase L step height and stride length. He reports 6/10 dyspnea after each bout of gait. O2 94-98% on RA. 125ft with RW and MinA + 3# ankle weight again, then 161ft with RW and CGA/MInA, no ankle weight. Patient completing step ups onto 6" step with B UE support, 3x12. Patient completing 2x5 mins on Kinetron 40cm/s. Noted decreased endurance. Patient returning to room in wc. Seatbelt alarm on, call light within reach.   Therapy Documentation Precautions:  Precautions Precautions: Fall, Other (comment) Precaution Comments: L hemi, posterior bias Restrictions Weight Bearing Restrictions: No     Therapy/Group: Individual Therapy  Karoline Caldwell, PT, DPT, CBIS  03/22/2021, 7:27 AM

## 2021-03-22 NOTE — Progress Notes (Addendum)
PROGRESS NOTE   Subjective/Complaints:  Pt without problems last night. Has experienced decreased urine flow and asked if he could use flomax.   ROS: Patient denies fever, rash, sore throat, blurred vision, nausea, vomiting, diarrhea, cough, shortness of breath or chest pain, joint or back pain, headache, or mood change.    Objective:   No results found. No results for input(s): WBC, HGB, HCT, PLT in the last 72 hours.  No results for input(s): NA, K, CL, CO2, GLUCOSE, BUN, CREATININE, CALCIUM in the last 72 hours.    Intake/Output Summary (Last 24 hours) at 03/22/2021 1022 Last data filed at 03/22/2021 0900 Gross per 24 hour  Intake 598 ml  Output 150 ml  Net 448 ml        Physical Exam: Vital Signs Blood pressure (!) 167/74, pulse 68, temperature (!) 97.4 F (36.3 C), temperature source Oral, resp. rate 16, height 5\' 9"  (1.753 m), weight 83.7 kg, SpO2 99 %.   Constitutional: No distress . Vital signs reviewed. HEENT: NCAT, EOMI, oral membranes moist Neck: supple Cardiovascular: RRR without murmur. No JVD    Respiratory/Chest: CTA Bilaterally without wheezes or rales. Normal effort    GI/Abdomen: BS +, non-tender, non-distended Ext: no clubbing, cyanosis, or edema Psych: pleasant and cooperative  Skin: No evidence of breakdown, no evidence of rash Musculoskeletal:     Full ROM, No pain with AROM or PROM in the neck, trunk, or extremities. Posture appropriate  Skin:    General: Skin is warm and dry.  Neurological:     Mental Status: He is alert.     Comments: Patient is alert.  Makes eye contact with examiner.  He was able to provide his name place, month, year, why he's here.  Follows basic commands. Left central 7 and tongue deviation. LUE 2/5 deltoid, 3/5 bicep, 2+ tricep,   2- wrist/fingers. LLE 2+ to 3/5. RUE and RLE 4 to 4+/5 prox to distal--stable. Sensory exam normal for light touch and pain in all 4  limbs. No limb ataxia or cerebellar signs. No abnormal tone appreciated.    Assessment/Plan: 1. Functional deficits which require 3+ hours per day of interdisciplinary therapy in a comprehensive inpatient rehab setting. Physiatrist is providing close team supervision and 24 hour management of active medical problems listed below. Physiatrist and rehab team continue to assess barriers to discharge/monitor patient progress toward functional and medical goals  Care Tool:  Bathing    Body parts bathed by patient: Left lower leg, Face, Left arm, Chest, Abdomen, Right upper leg, Buttocks, Front perineal area, Left upper leg, Right lower leg   Body parts bathed by helper: Buttocks, Right arm     Bathing assist Assist Level: Moderate Assistance - Patient 50 - 74%     Upper Body Dressing/Undressing Upper body dressing   What is the patient wearing?: Button up shirt, Hospital gown only    Upper body assist Assist Level: Moderate Assistance - Patient 50 - 74%    Lower Body Dressing/Undressing Lower body dressing      What is the patient wearing?: Underwear/pull up, Pants     Lower body assist Assist for lower body dressing: Moderate Assistance -  Patient 50 - 74%     Toileting Toileting    Toileting assist Assist for toileting: Dependent - Patient 0% (w/ stedy)     Transfers Chair/bed transfer  Transfers assist     Chair/bed transfer assist level: Minimal Assistance - Patient > 75% Chair/bed transfer assistive device: Armrests, Walker   Locomotion Ambulation   Ambulation assist   Ambulation activity did not occur: Safety/medical concerns  Assist level: Moderate Assistance - Patient 50 - 74% Assistive device: Lite Gait Max distance: 113ft   Walk 10 feet activity   Assist  Walk 10 feet activity did not occur: Safety/medical concerns        Walk 50 feet activity   Assist Walk 50 feet with 2 turns activity did not occur: Safety/medical concerns          Walk 150 feet activity   Assist Walk 150 feet activity did not occur: Safety/medical concerns         Walk 10 feet on uneven surface  activity   Assist Walk 10 feet on uneven surfaces activity did not occur: Safety/medical concerns         Wheelchair     Assist Is the patient using a wheelchair?: Yes Type of Wheelchair: Manual    Wheelchair assist level: Dependent - Patient 0%      Wheelchair 50 feet with 2 turns activity    Assist        Assist Level: Dependent - Patient 0%   Wheelchair 150 feet activity     Assist      Assist Level: Dependent - Patient 0%   Blood pressure (!) 167/74, pulse 68, temperature (!) 97.4 F (36.3 C), temperature source Oral, resp. rate 16, height 5\' 9"  (1.753 m), weight 83.7 kg, SpO2 99 %.  Medical Problem List and Plan: 1.  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  Antithrombotics: -DVT/anticoagulation:  Pharmaceutical: Lovenox             -antiplatelet therapy: Aspirin 81 mg daily and Plavix 75 mg day x3 weeks then aspirin alone.  Plavix can be discontinued after last dose 03/29/2021 3. Pain Management: Tylenol as needed 4. Mood/dementia: Aricept 10 mg nightly, Zoloft 100 mg daily             -antipsychotic agents: N/A 5. Neuropsych: This patient is capable of making decisions on his own behalf. 6. Skin/Wound Care: Routine skin checks 7. Fluids/Electrolytes/Nutrition: Routine in and outs with follow-up chemistries 8.  Diabetes mellitus.  Hemoglobin A1c 8.3.  Semglee just adjusted to 10 units daily.  Check blood sugars before meals and at bedtime             -cbg's poorly controlled at present.  CBG (last 3)  Recent Labs    03/21/21 1638 03/21/21 2056 03/22/21 0600  GLUCAP 180* 255* 231*   Semglee  20U  Patient on  metformin at home however elevated creatinine limits usage.  started low-dose NovoLog with meals 12/19 remains elevated. Increase semglee to 25 u daily (12/20) 9.  Dysphagia.  Dysphagia #3 nectar thick liquid diet.  Advance per speech therapy  12/14 should be ready for repeat swallow study  10.  CKD stage IIIa.  Baseline creatinine  1.3-1.8.  12/10- last Cr 1.41 down from 1.60 2 days prior- will monitor   BMP Latest Ref Rng & Units 03/15/2021 03/11/2021 03/10/2021  Glucose 70 - 99 mg/dL 213(H) 170(H) 186(H)  BUN 8 - 23 mg/dL 25(H) 22 23  Creatinine 0.61 - 1.24 mg/dL 1.43(H) 1.41(H) 1.54(H)  Sodium 135 - 145 mmol/L 139 135 138  Potassium 3.5 - 5.1 mmol/L 4.0 4.4 4.7  Chloride 98 - 111 mmol/L 104 105 104  CO2 22 - 32 mmol/L 27 25 24   Calcium 8.9 - 10.3 mg/dL 8.7(L) 8.7(L) 8.6(L)   03/15/21 stable --follow up this 12/20 11.  Macrocytic anemia/vitamin B12 deficiency.   -b12 injections x 5 days (thru 12/11)-->oral supplementation -Continue iron supplement for mild iron def anemia 12.  History of prostate cancer with prostatectomy.  Follow-up outpatient.  -12/19 added flomax for urine flow---bp can tolerate 13.  History of adenocarcinoma left lung with resection 2001.  Follow-up outpatient  12/10- pt usually wheezes per daughter- is chronic for him- s/p lobectomy.    14.  Hyperlipidemia.  Lipitor 15.  Constipation.  Senokot S2 tabs twice daily, MiraLAX daily.   +BM 12/18   16.  Mild thrombocytopenia- stable no signs of hemorrhage , monitor while on enoxaparin  CBC Latest Ref Rng & Units 03/15/2021 03/10/2021 03/09/2021  WBC 4.0 - 10.5 K/uL 5.4 6.6 6.6  Hemoglobin 13.0 - 17.0 g/dL 12.2(L) 12.3(L) 11.7(L)  Hematocrit 39.0 - 52.0 % 36.5(L) 36.4(L) 34.9(L)  Platelets 150 - 400 K/uL 110(L) 113(L) 117(L)    LOS: 10 days A FACE TO FACE EVALUATION WAS PERFORMED  Meredith Staggers 03/22/2021, 10:22 AM

## 2021-03-22 NOTE — Plan of Care (Signed)
Problem: RH Expression Communication Goal: LTG Patient will increase speech intelligibility (SLP) Description: LTG: Patient will increase speech intelligibility at word/phrase/conversation level with cues, % of the time (SLP) Outcome: Completed/Met   

## 2021-03-22 NOTE — Progress Notes (Signed)
Occupational Therapy Session Note  Patient Details  Name: [name] MRN: [id number] Date of Birth: October 17, 1930  Today's Date: 03/22/2021 OT Individual Time: 1311-1430 OT Individual Time Calculation (min): 79 min    Short Term Goals: Week 2:  OT Short Term Goal 1 (Week 2): Pt will don shirt with min A. OT Short Term Goal 2 (Week 2): Pt will donn his LB clothing at min assist level excluding AFO. OT Short Term Goal 3 (Week 2): Pt will use the LUE at a diminshed level for washing the RUE with min facilitation. OT Short Term Goal 4 (Week 2): Pt will complete toilet transfers and toileting tasks with min assist sit to stand.  Skilled Therapeutic Interventions/Progress Updates:    Pt in wheelchair to start session.  He was able to complete functional mobility to the bathroom for use of the toilet with mod assist and RW.  AFO in place as well.  He was then able to complete clothing management and toilet hygiene with min assist sit to stand and use of the RW for support.  Mod assist for removal of LB clothing sit to stand with supervision for UB, in order to transfer over to the tub bench for showering.  He was able to complete bathing at min assist level sit to stand with use of the grab bar for support.  LUE was incorporated for washing the RUE with mod assist level.  He was able to use it for washing both upper legs with supervision.  Next, had him transition out to the wheelchair at [facility] assist level for dressing tasks.  He was able to complete UB dressing with mod assist and then LB dressing at mod assist for brief and pants.  Therapist assisted with TEDs and then he donned the right shoe and fastened it with setup.  Mod assist for donning the left shoe with AFO.  Once ADL tasks were complete, he was taken in the wheelchair down to the ortho gym where he worked on LUE neuromuscular re-education with use of the UE ergonometer.  Resistance placed on level 6 with completion of BUE strengthening for 2  mins.  He then progressed to use of the LUE only for 2 sets of 2.5 mins with min facilitation and use of the ace bandage to help maintain hand on the left handle.  Returned back to the room with transfer to the bed at mod assist stand pivot.  He was able to remove his shoes and AFO on the LLE with min assist and transfer to supine.  Call button and phone in reach with safety alarm in place and with the call button in reach.  Family also present at end of session with daughter asking about progress and if current ELOS is appropriate.    Therapy Documentation Precautions:  Precautions Precautions: Fall, Other (comment) Precaution Comments: L hemi, posterior bias Restrictions Weight Bearing Restrictions: No  Pain: Pain Assessment Pain Scale: Faces Pain Score: 0-No pain ADL: See Care Tool Section for some details of mobility and selfcare   Therapy/Group: Individual Therapy  Arminta Gamm,[email or web address] 03/22/2021, 3:30 PM

## 2021-03-22 NOTE — Progress Notes (Signed)
Speech Language Pathology Daily Session Note  Patient Details  Name: [name] MRN: [id number] Date of Birth: 1930/10/05  Today's Date: 03/22/2021 SLP Individual Time: 0800-0900 SLP Individual Time Calculation (min): 60 min  Short Term Goals: Week 2: SLP Short Term Goal 1 (Week 2): Pt will consume therapeutic trials of thin liquids with minimal overt s/s of aspiration and mod I use of swallowing precautions over 2 consecutive sessions prior to repeat instrumental assessment.  Skilled Therapeutic Interventions: Skilled ST treatment focused on swallowing goals. Patient performed oral care with set-up assist. Patient consumed ice chips without overt s/sx of aspiration. He consumed thin liquid (water) cup sips using effortful swallow maneuver with delayed cough response x1. No further s/sx observed with further thin liquid trials. Pt denied any difficulty consuming regular solids since diet advancement on 12/17. Pt continues to demonstrate good recall and carry over of safe swallow precautions and strategies and implements with mod I-to-sup A verbal cues. He is very cautious and takes his time with PO intake and while consuming trials. Continue regular texture diet, nectar thick liquids. Continue water protocol with ice chips only. Pt does not require staff supervision while consuming ice chips, however will require set-up assist to complete oral car in which patient has been educated on and verbalized understanding through teach back. Patient was left in bed with alarm activated and immediate needs within reach at end of session. Continue per current plan of care.      Pain Pain Assessment Pain Scale: 0-10 Pain Score: 0-No pain  Therapy/Group: Individual Therapy  [name] 03/22/2021, 8:15 AM

## 2021-03-22 NOTE — Progress Notes (Signed)
Wife and caregiver in room with pt. Discussed insulin and checking blood sugar at home. Wife reported that checks blood sugar 3x a day and gives insulin at home. Caregiver reported that that is incorrect. Caregiver reported that wife gives her self injections and checks her blood sugar 3x a day but at end of conversation this nurse was confused. Wife reported that was taking glipizide at home and found some metformin and started taking that as well?? Notified nurse coordinator that wife is confused about medications but will have a caretaker to assist with medications, pt son may also be able to assist as he use to be a paramedic according to pt wife.

## 2021-03-23 LAB — GLUCOSE, CAPILLARY
Glucose-Capillary: 194 mg/dL — ABNORMAL HIGH (ref 70–99)
Glucose-Capillary: 185 mg/dL — ABNORMAL HIGH (ref 70–99)
Glucose-Capillary: 181 mg/dL — ABNORMAL HIGH (ref 70–99)
Glucose-Capillary: 114 mg/dL — ABNORMAL HIGH (ref 70–99)

## 2021-03-23 NOTE — Progress Notes (Signed)
Occupational Therapy Session Note  Patient Details  Name: [name] MRN: [id number] Date of Birth: [birth year]  Today's Date: 03/23/2021 OT Individual Time: 1001-1101 OT Individual Time Calculation (min): 60 min    Short Term Goals: Week 2:  OT Short Term Goal 1 (Week 2): Pt will don shirt with min A. OT Short Term Goal 2 (Week 2): Pt will donn his LB clothing at min assist level excluding AFO. OT Short Term Goal 3 (Week 2): Pt will use the LUE at a diminshed level for washing the RUE with min facilitation. OT Short Term Goal 4 (Week 2): Pt will complete toilet transfers and toileting tasks with min assist sit to stand.  Skilled Therapeutic Interventions/Progress Updates:    Session 1: (1001-1101)  Pt in wheelchair to start session with no report of pain but request to complete toileting.  Min assist for transfer from the wheelchair at sink side to the 3:1 over the toilet with min assist using the RW for support.  Pt at times with too big of a step length on the left with slight adduction noted.  One LOB posteriorly when attempting to step up the small incline into the bathroom.  He was able to completed toilet hygiene and clothing management in standing at min assist level, with decreased efficiency with pulling up pants on the left side and therapist assisting him with this.  He was then able to ambulate back out to the sink with min assist for washing his hands while standing.  Therapist took him down to the therapy gym where he transferred to the therapy mat with mod assist stand pivot. Increased posterior lean with mod demonstrational cueing for scooting forward to the edge of the chair, pushing off the surface, while flexing his trunk forward.  Once on the mat, had him work in quadriped with weightbearing over the LUE.  Mod facilitation at times to maintain left elbow extension while reaching with the LUE to pick up and place clothespins as well as when washing the mat table using the  LUE.  He transitioned to left sidelying to rest secondary to fatigue.  Max assist was needed for transition back to quadriped after resting in sidelying.  Transitioned to supine with work on bilateral shoulder flexion with use of a therapy ball.  Mod facilitation needed to maintain digit extension and elbow extension to hold the ball at approximately 90 degrees shoulder flexion.  Transitioned to working on rolling to both the right and left with activation of the LUE and abdominal muscles to assist with activity.  He was able to roll with min assist to the right and supervision to the left.  Mod demonstrational cueing was needed for technique to complete.  Min assist was also needed for transition from left sidelying to sitting with emphasis on left trunk shortening and activation of the left elbow extensors.  Finished session with arm on body activation using a tilted stool for shoulder flexion with weightbearing surface.  He was able to push the stool forward and maintain throughout with mod instructional cueing.  Returned to the wheelchair at min assist stand pivot with return to the room and pt left sitting up with the call button and phone in reach and safety alarm belt in place.    Session 2: (1062-6948)  Pt in wheelchair to start session with no report of pain.  He reported wanting to go to the bathroom so transfer was completed ambulating with the RW to the 3:1 over  the toilet.  He was able to complete transfer at min assist level, taking too large of steps at times and needing cueing to make them smaller and to slow down.  He then completed toilet hygiene and clothing management at min assist level sit to stand with therapist setting him up with washcloths for thoroughness.  He was able to transfer out to the sink at min assist level for washing his hands.  Once complete, therapist took him down to the therapy gym where he transferred to the therapy mat for focus on LUE activation and functional use.   NMES was applied to the dorsal forearm digit extensors for activation of digit and wrist extension.  Intensity was placed on level 32 with initial on/off time at 10/5.  He tolerated 10 mins of active stimulation while working on opening hand and extending wrist along with the unit.  The switch was used for the last 4 mins to help facilitate release for functional grasp.  He was able to reach for foam blocks with the LUE with therapist providing stimulation to help with opening hand in preparation to grasp the block as well as for releasing it after picking it up.  Min facilitation at the shoulder and elbow needed for reaching to knee level.  He tolerated 14 mins total without any adverse reactions or pain noted.  He finished session with return to the wheelchair at min assist level and then to the bed.  He removed his shoes and AFO at min assist level with total assist for TEDs.  Min assist for sit to supine to conclude session with call button and phone in reach.  Safety alarm in place as well.          Therapy Documentation Precautions:  Precautions Precautions: Fall, Other (comment) Precaution Comments: L hemi, posterior bias Restrictions Weight Bearing Restrictions: No  Pain: Pain Assessment Pain Scale: Faces Pain Score: 0-No pain ADL: See Care Tool Section for some details for mobility and selfcare   Therapy/Group: Individual Therapy  Treasure Ochs,Marky OTR/L 03/23/2021, 3:44 PM

## 2021-03-23 NOTE — Progress Notes (Signed)
Speech Language Pathology Daily Session Note  Patient Details  Name: [name] MRN: [id number] Date of Birth: January 13, 1931  Today's Date: 03/23/2021 SLP Individual Time: 1115-1200 SLP Individual Time Calculation (min): 45 min  Short Term Goals: Week 2: SLP Short Term Goal 1 (Week 2): Pt will consume therapeutic trials of thin liquids with minimal overt s/s of aspiration and mod I use of swallowing precautions over 2 consecutive sessions prior to repeat instrumental assessment.  Skilled Therapeutic Interventions: Skilled ST treatment focused on swallowing goals. Patient independently completed oral care at sink prior to therapeutic PO trials. Pt accepted ice chips without overt s/sx of aspiration. He consumed thin liquids by cup with immediate cough response x1 with 4oz of thin liquid (water only). Patient executed effortful swallow effectively with thin liquid cup sips. He required initial verbal cue to keep head in neutral position and maintained this throughout session. Patient's daughter arrived at end of session. Educated daughter on thickened liquids, provided demonstration on using thickening agent to achieve nectar thick consistency, and safe swallow precautions. Also informed both patient and daughter of plans for modified barium swallow study scheduled tomorrow. Both verbalized understanding and agreement. Patient was left in wheelchair with alarm activated and immediate needs within reach at end of session. Continue per current plan of care.      Pain Pain Assessment Pain Scale: 0-10 Pain Score: 0-No pain  Therapy/Group: Individual Therapy  Patty Sermons 03/23/2021, 12:47 PM

## 2021-03-23 NOTE — Progress Notes (Signed)
PROGRESS NOTE   Subjective/Complaints:  Discussed diabetic management with patient.  He has no other new complaints today.  ROS: Patient denies CP, SOB, N/V/D   Objective:   No results found. No results for input(s): WBC, HGB, HCT, PLT in the last 72 hours.  No results for input(s): NA, K, CL, CO2, GLUCOSE, BUN, CREATININE, CALCIUM in the last 72 hours.    Intake/Output Summary (Last 24 hours) at 03/23/2021 0926 Last data filed at 03/23/2021 0700 Gross per 24 hour  Intake 720 ml  Output 250 ml  Net 470 ml         Physical Exam: Vital Signs Blood pressure 129/64, pulse 79, temperature 97.9 F (36.6 C), resp. rate 14, height 5\' 9"  (1.753 m), weight 85.3 kg, SpO2 95 %.    General: No acute distress Mood and affect are appropriate Heart: Regular rate and rhythm no rubs murmurs or extra sounds Lungs: Clear to auscultation, breathing unlabored, no rales or wheezes Abdomen: Positive bowel sounds, soft nontender to palpation, nondistended Extremities: No clubbing, cyanosis, or edema Skin: No evidence of breakdown, no evidence of rash  Musculoskeletal:     Full ROM, No pain with AROM or PROM in the neck, trunk, or extremities. Posture appropriate  Skin:    General: Skin is warm and dry.  Neurological:     Mental Status: He is alert.     Comments: Patient is alert.  Makes eye contact with examiner.  He was able to provide his name place, month, year, why he's here.  Follows basic commands. Left central 7 and tongue deviation. LUE 2/5 deltoid, 3/5 bicep, 2+ tricep,   2- wrist/fingers. LLE 2+ to 3/5. RUE and RLE 4 to 4+/5 prox to distal--stable. Sensory exam normal for light touch and pain in all 4 limbs. No limb ataxia or cerebellar signs. No abnormal tone appreciated.    Assessment/Plan: 1. Functional deficits which require 3+ hours per day of interdisciplinary therapy in a comprehensive inpatient rehab  setting. Physiatrist is providing close team supervision and 24 hour management of active medical problems listed below. Physiatrist and rehab team continue to assess barriers to discharge/monitor patient progress toward functional and medical goals  Care Tool:  Bathing    Body parts bathed by patient: Left lower leg, Face, Left arm, Chest, Abdomen, Right upper leg, Buttocks, Front perineal area, Left upper leg, Right lower leg   Body parts bathed by helper: Right arm     Bathing assist Assist Level: Moderate Assistance - Patient 50 - 74%     Upper Body Dressing/Undressing Upper body dressing   What is the patient wearing?: Pull over shirt    Upper body assist Assist Level: Moderate Assistance - Patient 50 - 74%    Lower Body Dressing/Undressing Lower body dressing      What is the patient wearing?: Underwear/pull up, Pants     Lower body assist Assist for lower body dressing: Moderate Assistance - Patient 50 - 74%     Toileting Toileting    Toileting assist Assist for toileting: Dependent - Patient 0% (w/ stedy)     Transfers Chair/bed transfer  Transfers assist     Chair/bed transfer  assist level: Minimal Assistance - Patient > 75% Chair/bed transfer assistive device: Armrests, Walker   Locomotion Ambulation   Ambulation assist   Ambulation activity did not occur: Safety/medical concerns  Assist level: Moderate Assistance - Patient 50 - 74% Assistive device: Lite Gait Max distance: 18ft   Walk 10 feet activity   Assist  Walk 10 feet activity did not occur: Safety/medical concerns        Walk 50 feet activity   Assist Walk 50 feet with 2 turns activity did not occur: Safety/medical concerns         Walk 150 feet activity   Assist Walk 150 feet activity did not occur: Safety/medical concerns         Walk 10 feet on uneven surface  activity   Assist Walk 10 feet on uneven surfaces activity did not occur: Safety/medical  concerns         Wheelchair     Assist Is the patient using a wheelchair?: Yes Type of Wheelchair: Manual    Wheelchair assist level: Dependent - Patient 0%      Wheelchair 50 feet with 2 turns activity    Assist        Assist Level: Dependent - Patient 0%   Wheelchair 150 feet activity     Assist      Assist Level: Dependent - Patient 0%   Blood pressure 129/64, pulse 79, temperature 97.9 F (36.6 C), resp. rate 14, height 5\' 9"  (1.753 m), weight 85.3 kg, SpO2 95 %.  Medical Problem List and Plan: 1.  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  Continue CIR- PT, OT and SLP 2.  Antithrombotics: -DVT/anticoagulation:  Pharmaceutical: Lovenox             -antiplatelet therapy: Aspirin 81 mg daily and Plavix 75 mg day x3 weeks then aspirin alone.  Plavix can be discontinued after last dose 03/29/2021 3. Pain Management: Tylenol as needed 4. Mood/dementia: Aricept 10 mg nightly, Zoloft 100 mg daily             -antipsychotic agents: N/A 5. Neuropsych: This patient is capable of making decisions on his own behalf. 6. Skin/Wound Care: Routine skin checks 7. Fluids/Electrolytes/Nutrition: Routine in and outs with follow-up chemistries 8.  Diabetes mellitus.  Hemoglobin A1c 8.3.  Semglee just adjusted to 10 units daily.  Check blood sugars before meals and at bedtime             -cbg's poorly controlled at present.  CBG (last 3)  Recent Labs    03/22/21 1642 03/22/21 2105 03/23/21 0630  GLUCAP 189* 150* 194*    Semglee  20U  Patient on metformin at home however elevated creatinine limits usage.  started low-dose NovoLog with meals 12/19 remains elevated. Increase semglee to 25 u daily (12/20) Per wife was on glipizide at home may resume and d/c novalog Creat too hi for  metformin 9.  Dysphagia.  Dysphagia #3 nectar thick liquid diet.  Advance per speech therapy  12/14 should be ready for repeat swallow study  10.  CKD stage IIIa.  Baseline creatinine 1.3-1.8.  12/10- last Cr 1.41 down from 1.60 2 days prior- will monitor   BMP Latest Ref Rng & Units 03/15/2021 03/11/2021  03/10/2021  Glucose 70 - 99 mg/dL 213(H) 170(H) 186(H)  BUN 8 - 23 mg/dL 25(H) 22 23  Creatinine 0.61 - 1.24 mg/dL 1.43(H) 1.41(H) 1.54(H)  Sodium 135 - 145 mmol/L 139 135 138  Potassium 3.5 - 5.1 mmol/L 4.0 4.4 4.7  Chloride 98 - 111 mmol/L 104 105 104  CO2 22 - 32 mmol/L 27 25 24   Calcium 8.9 - 10.3 mg/dL 8.7(L) 8.7(L) 8.6(L)   03/15/21 stable --follow up this 12/20 11.  Macrocytic anemia/vitamin B12 deficiency.   -b12 injections x 5 days (thru 12/11)-->oral supplementation -Continue iron supplement for mild iron def anemia 12.  History of prostate cancer with prostatectomy.  Follow-up outpatient.  -12/19 added flomax for urine flow---bp can tolerate 13.  History of adenocarcinoma left lung with resection 2001.  Follow-up outpatient  12/10- pt usually wheezes per daughter- is chronic for him- s/p lobectomy.    14.  Hyperlipidemia.  Lipitor 15.  Constipation.  Senokot S2 tabs twice daily, MiraLAX daily.   +BM 12/18   16.  Mild thrombocytopenia- stable no signs of hemorrhage , monitor while on enoxaparin  CBC Latest Ref Rng & Units 03/15/2021 03/10/2021 03/09/2021  WBC 4.0 - 10.5 K/uL 5.4 6.6 6.6  Hemoglobin 13.0 - 17.0 g/dL 12.2(L) 12.3(L) 11.7(L)  Hematocrit 39.0 - 52.0 % 36.5(L) 36.4(L) 34.9(L)  Platelets 150 - 400 K/uL 110(L) 113(L) 117(L)    LOS: 11 days A FACE TO FACE EVALUATION WAS PERFORMED  Charlett Blake 03/23/2021, 9:26 AM

## 2021-03-23 NOTE — Progress Notes (Signed)
Physical Therapy Session Note  Patient Details  Name: [name] MRN: [id number] Date of Birth: 1931-02-24  Today's Date: 03/23/2021 PT Individual Time: 0807-0905 PT Individual Time Calculation (min): 58 min   Short Term Goals: Week 2:  PT Short Term Goal 1 (Week 2): Patient will complete bed mobility with CGA PT Short Term Goal 2 (Week 2): Patient will improve Berg score by at least 7 pointts PT Short Term Goal 3 (Week 2): Patient will ambulate >52ft with MinA  Skilled Therapeutic Interventions/Progress Updates:    Pt received supine in bed and agreeable to therapy session. Donned L ear hearing aid with set-up assist. Sit>supine L EOB, HOB slightly elevated and using bedrails as pt has hospital bed at home, with close supervision and 1x min assist due to posterior LOB - requires verbal cuing for sequencing L LE positioning to increase pt independence with this task. Sit<>stand to/from EOB using RW with CGA/light min assist - doffed dirty briefs min assist - in sitting threaded clean LB clothing with assist to position L LE into figure 4 and min assist for managing clothes. Donned B shoes and L LE [place] Walk-on AFO with max assist for time management. Educated pt on need to perform daily skin assessments when wearing an AFO with plan for AFO consultation prior to his D/C.   Gait training ~177ft to main therapy gym using RW (no longer using L UE hand orthosis with no signs of difficulty sustaining grasp on RW) with CGA/light min assist due to intermittent L toe catching during swing resulting in minor anterior LOB - continues to have impaired L LE coordination during swing.   Dynamic gait training using RW with CGA/light min assist for balance while navigating around obstacles in environment such as chairs, tables, and cones working on AD management in more of a home environment - also targeting AD management and turning in different directions and in smaller spaces.   Dynamic gait  training using RW with CGA/light min assist for balance with focus on dual-task challenge of locating and grasping numbered disks with L UE - this is targeting pt's priority on his balance during this task and ensuring L hand placed safely back on AD prior to initiating moving. Continues to have intermittent L toe catching during swing and intermittent L knee snapping into hyperextension when he doesn't get a full L step length.    Gait training back to room using RW with a more consistent min assist at this time due to fatigue resulting in more frequent L toe catching during swing with more frequent anterior LOB. Pt left seated in w/c with needs in reach, seat belt alarm on, and L UE supported on half lap tray.   Therapy Documentation Precautions:  Precautions Precautions: Fall, Other (comment) Precaution Comments: L hemi, posterior bias Restrictions Weight Bearing Restrictions: No   Pain: Reports R eye burning - nurse notified. Denies any other pain.    Therapy/Group: Individual Therapy  Tawana Scale , PT, DPT, NCS, CSRS  03/23/2021, 7:48 AM

## 2021-03-24 ENCOUNTER — Inpatient Hospital Stay (HOSPITAL_COMMUNITY): Payer: Medicare HMO

## 2021-03-24 LAB — GLUCOSE, CAPILLARY
Glucose-Capillary: 115 mg/dL — ABNORMAL HIGH (ref 70–99)
Glucose-Capillary: 188 mg/dL — ABNORMAL HIGH (ref 70–99)
Glucose-Capillary: 122 mg/dL — ABNORMAL HIGH (ref 70–99)
Glucose-Capillary: 161 mg/dL — ABNORMAL HIGH (ref 70–99)

## 2021-03-24 MED ORDER — TOBRAMYCIN 0.3 % OP SOLN
1.0000 [drp] | Freq: Four times a day (QID) | OPHTHALMIC | Status: AC
  Administered 2021-03-25 – 2021-03-31 (×26): 1 [drp] via OPHTHALMIC
  Filled 2021-03-24 (×2): qty 5

## 2021-03-24 NOTE — Patient Care Conference (Signed)
Inpatient RehabilitationTeam Conference and Plan of Care Update Date: 03/24/2021   Time: 10:42 AM    Patient Name: [name]      Medical Record Number: [id number]  Date of Birth: September 13, 1930 Sex: Male         Room/Bed: 4W09C/4W09C-01 Payor Info: Payor: [employer] MEDICARE / Plan: [place] MEDICARE HMO/PPO / Product Type: *No Product type* /    Admit Date/Time:  03/12/2021  3:40 PM  Primary Diagnosis:  Right pontine cerebrovascular accident [facility])  Hospital Problems: Principal Problem:   Right pontine cerebrovascular accident [facility])    Expected Discharge Date: Expected Discharge Date: 04/02/21  Team Members Present: Physician leading conference: Dr. Alysia Penna Social Worker Present: Erlene Quan, BSW Nurse Present: Dorien Chihuahua, RN PT Present: Page Spiro, PT OT Present: Clyda Greener, OT SLP Present: Sherren Kerns, SLP PPS Coordinator present : Gunnar Fusi, SLP     Current Status/Progress Goal Weekly Team Focus  Bowel/Bladder   Pt is continent of bowel/bladder  Pt will remain continent of bowel/bladder  Will assess qshift and PRN   Swallow/Nutrition/ Hydration   Regular diet, NTL, ice protocol, trials of thin liquids (water only) with SLP  Mod I for least restrictive diet (regular/thin likely)  thin liquid trials, MBS this week (likely Wednesday 12/21)   ADL's   Min assist for UB bathing with mod assist for UB dressing.  Mod assist for LB dressing with min assist for bathing.  Min to mod assist for toielt transfers as well.  Uses the LUE at a gross assist level with mod assist to integrate at a diminshed level.  contact guard to supervision overall  selfcare retraining, transfer training, DME education, neuromuscular re-education, therapeutic exercise   Mobility   CGA bed mobility, CGA/min assist sit<>stand and stand pivot transfers using RW, min assist gait up to 135f using RW wearing L LE AFO  supervision overall at ambulatory level  activity tolerance, L LE  NMR, dynamic standing balance, dynamic gait training, pt/family education, transfer training, DME training   Communication   mod I - goal met. No longer addressing  mod I      Safety/Cognition/ Behavioral Observations            Pain   Pt is currently pain free  Pt will remain pain free  Will assess qshift and PRN   Skin   Pt's skin is intact  Pt's skin will remain intact  Will assess qshift and PRN     Discharge Planning:  D/c home with children to assist during the day, spouse assisting at night   Team Discussion: Patient with hemiparesis post right pontine CVA. MD monitoring low grade temp today and adjusted DM medications. Patient has a uncoordinated walking pattern with posterior loss of balance.  Patient on target to meet rehab goals: yes, currently min assist for upper body care and mod assist for lower body bathing and dressing. Goals for discharge set for min - mod assist.  *See Care Plan and progress notes for long and short-term goals.   Revisions to Treatment Plan:  AFO ordered   Teaching Needs: Safety, medication management, secondary risk management, toileting, transfers, etc.  Current Barriers to Discharge: Decreased caregiver support; wife has a caregiver  Possible Resolutions to Barriers: Family education DME: RW and W/C OP follow up services recommended; HDonaldsonwill be fine if transportation is an issue     Medical Summary Current Status: DM still with poor control, just received AFO  Barriers to Discharge: Medical  stability;Neurogenic Bowel & Bladder  Barriers to Discharge Comments: Chronic urinary incont Possible Resolutions to Barriers/Weekly Focus: Monitor for asp PNA on upgraded diet   Continued Need for Acute Rehabilitation Level of Care: The patient requires daily medical management by a physician with specialized training in physical medicine and rehabilitation for the following reasons: Direction of a multidisciplinary physical rehabilitation  program to maximize functional independence : Yes Medical management of patient stability for increased activity during participation in an intensive rehabilitation regime.: Yes Analysis of laboratory values and/or radiology reports with any subsequent need for medication adjustment and/or medical intervention. : Yes   I attest that I was present, lead the team conference, and concur with the assessment and plan of the team.   Dorien Chihuahua B 03/24/2021, 2:55 PM

## 2021-03-24 NOTE — Progress Notes (Signed)
Physical Therapy Session Note  Patient Details  Name: [name] MRN: [id number] Date of Birth: 08-May-1930  Today's Date: 03/24/2021 PT Individual Time: 6381-7711 PT Individual Time Calculation (min): 41 min   Short Term Goals: Week 2:  PT Short Term Goal 1 (Week 2): Patient will complete bed mobility with CGA PT Short Term Goal 2 (Week 2): Patient will improve Berg score by at least 7 pointts PT Short Term Goal 3 (Week 2): Patient will ambulate >58ft with MinA  Skilled Therapeutic Interventions/Progress Updates:    Pt received supine in bed with Delaney from [employer] present to deliver pt's personal [place] Walk-on AFO. Pt agreeable to therapy session. Supine>sitting L EOB, HOB elevated and using bedrail as pt has hospital bed at home, with CGA for safety - no instances of trunk LOB this AM. Sit<>stand EOB<>RW with CGA/light min assist for balance/steadying - continued cuing to remember to bring L foot backwards underneath BOS prior to coming to stand and cuing to push up with R hand from bed (requires these 2 cues to be repeated throughout session). Standing with 1 UE support on RW with CGA/min assist due to minor posterior lean while pt doffed dirty LB clothing and pulled up clean LB clothing with cuing and assist to engage L UE in task (therapist pulls waist of pants away from side to allow pt to get a grasp with his L hand). Sitting EOB, with supervision for trunk control - pt threaded clean LB clothing over his feet with only set-up assist and without assist to bring L LE into figure 4 position today. Donned shoes and L LE AFO max assist for time management.   Pt reports feeling like R LE is weaker as of yesterday evening after therapies - no difference in strength or mobility noted - notified MD.  Gait training ~173ft 2x to/from main therapy gym using RW with light min assist - continues to have a quick "whipping" movement of bilateral lower legs during swing phase resulting in quick,  unstable appearing initial contact - cuing to control this movement with improvement noted temporarily. Pt noted to have increased ease of L limb advancement when tired and not fully clearing foot now that toe cap is on his shoe. Sit>supine using bedrails with supervision. Pt left supine in bed with needs in reach and bed alarm on in preparation for swallow study.   Therapist provided pt with printed handout on AFO education.    Therapy Documentation Precautions:  Precautions Precautions: Fall, Other (comment) Precaution Comments: L hemi, posterior bias Restrictions Weight Bearing Restrictions: No   Pain:  No reports of pain throughout session.   Therapy/Group: Individual Therapy  Tawana Scale , PT, DPT, NCS, CSRS  03/24/2021, 7:46 AM

## 2021-03-24 NOTE — Progress Notes (Signed)
Modified Barium Swallow Progress Note  Patient Details  Name: [name] MRN: [id number] Date of Birth: 11-09-1930  Today's Date: 03/24/2021  Modified Barium Swallow completed.  Full report located under Chart Review in the Imaging Section.  Brief recommendations include the following:  Clinical Impression  Patient presents with improved swallow function as compared to MBS completed on 12/5. He exhibited a mild oral phase dysphagia with reduced anterior to posterior transit of puree and regular solid textures as well as decreased oral containment with thin liquids, leading to premature spillage into vallecular sinus. During pharyngeal phase of swallow, patient exhibited swallow initiation delays to level of vallecular sinus with nectar thick liquids, puree solids, regular solids. With thin liquids, patient exhibited trace flash penetration above vocal cords (PAS 2) when drinking via cup sips, but with no instances of aspiration and with full clearance of penetrate from laryngeal vestibule. No significant difference observed when using chin tuck posture versus head neutral posture. When taking straw sip of thin liquids, patient exhibited swallow initiation delay at level of pyriform sinus and sensed aspiration (PAS 7) of mild-moderate amount which occured during the swallow. He did have a strong cough response and although no barium observed in trachea following aspiration event, unable to determine if he coughed it out or if it traveled deeper. When taking bite of puree solids, patient did exhibit an instance of very trace penetration of what is suspected to have been vallecular sinus residuals from previous thin liquid sip. SLP instructed patient to take cup sip of thin liquids while still masticating graham cracker and he did have an incident of sensed trace aspiration during the swallow (PAS 7). Barium tablet taken whole with nectar thick liquids transisted well orally and pharyngeally, however  it did become briefly lodged in upper thoracic portion of esophagus. SLP gave patient a spoonful of puree barium and this helped to fully transit barium tablet. SLP is recommending to upgrade patient to thin liquids and continue with regular solids. SLP is recommending no straws and no mixed consistencies of thin liquids and regular solids (broth soups are ok, but not soups with pieces of solids like vegetables, etc.) If patient would like mixed consistencies, recommend thickening broth to nectar thick consistency.   Swallow Evaluation Recommendations       SLP Diet Recommendations: No mixed consistencies   Liquid Administration via: Cup;No straw   Medication Administration: Whole meds with puree   Supervision: Patient able to self feed   Compensations: Minimize environmental distractions;Slow rate;Small sips/bites;Other (Comment) (swallow solids prior to any liquid consumption)       Oral Care Recommendations: Oral care BID        Sonia Baller, MA, CCC-SLP Speech Therapy

## 2021-03-24 NOTE — Progress Notes (Signed)
Patient ID: [name], male   DOB: November 04, 1930, 87 y.o.   MRN: [id number]  Sw made attempt to call patient spouse to provide conference updates. SW will continue to follow up.

## 2021-03-24 NOTE — Progress Notes (Signed)
Speech Language Pathology Weekly Progress and Session Note  Patient Details  Name: [name] MRN: [id number] Date of Birth: 1931/03/10  Beginning of progress report period: March 19, 2021 End of progress report period: March 24, 2021  Short Term Goals: Week 2: SLP Short Term Goal 1 (Week 2): Pt will consume therapeutic trials of thin liquids with minimal overt s/s of aspiration and mod I use of swallowing precautions over 2 consecutive sessions prior to repeat instrumental assessment. SLP Short Term Goal 1 - Progress (Week 2): Met  New Short Term Goals: Week 3: SLP Short Term Goal 1 (Week 3): STG=LTG due to ELOS  Weekly Progress Updates: Patient has made excellent gains and has met 1 of 1 STGs this reporting period. Patient demonstrates improved oropharyngeal swallow function as evidenced by clinical observations at bedside via recent diet advancement to regular textures, and consumption of thin liquids (water only) with minimal overt s/s of aspiration. Improvement in oropharyngeal swallow was further evidenced by repeat modified barium swallow study (MBS) obtained today demonstrating improved airway protection with thin liquids as compared to initial MBS on 03/08/21. Diet was advanced to regular textures, thin liquids. Meds whole in puree. Precautions include NO straws or mixed consistencies (swallow all solids first prior to swallowing liquid), as well as general swallowing precautions such as upright positioning with all PO intake, small bites/sips.  Patient and family education is ongoing. Patient would benefit from continued skilled SLP intervention for follow up to assess diet and thin liquid tolerance and implementation of safe swallowing precautions/strategies at modified independent level prior to signing off for swallowing.   Intensity: Minumum of 1-2 x/day, 30 to 90 minutes Frequency: 1 to 3 out of 7 days Duration/Length of Stay: 12/30 Treatment/Interventions: Oncologist;Dysphagia/aspiration precaution training;Patient/family education;Internal/external aids  Patty Sermons 03/24/2021, 12:48 PM

## 2021-03-24 NOTE — Progress Notes (Signed)
PROGRESS NOTE   Subjective/Complaints: Itchy eyes, has some tearing and crusting, has had this in past an it responded to antibotic eye drops Just obtained his AFO this am for LLE Per RN was incont of bladder   ROS: Patient denies CP, SOB, N/V/D   Objective:   No results found. No results for input(s): WBC, HGB, HCT, PLT in the last 72 hours.  No results for input(s): NA, K, CL, CO2, GLUCOSE, BUN, CREATININE, CALCIUM in the last 72 hours.    Intake/Output Summary (Last 24 hours) at 03/24/2021 1037 Last data filed at 03/24/2021 0045 Gross per 24 hour  Intake 240 ml  Output 195 ml  Net 45 ml         Physical Exam: Vital Signs Blood pressure (!) 127/55, pulse 67, temperature 99.3 F (37.4 C), resp. rate 16, height '5\' 9"'  (1.753 m), weight 84.6 kg, SpO2 95 %. HEENT- some lacrimation RIght eye , non injected General: No acute distress Mood and affect are appropriate Heart: Regular rate and rhythm no rubs murmurs or extra sounds Lungs: Clear to auscultation, breathing unlabored, no rales or wheezes Abdomen: Positive bowel sounds, soft nontender to palpation, nondistended Extremities: No clubbing, cyanosis, or edema Skin: No evidence of breakdown, no evidence of rash  Musculoskeletal:     Full ROM, No pain with AROM or PROM in the neck, trunk, or extremities. Posture appropriate  Skin:    General: Skin is warm and dry.  Neurological:     Mental Status: He is alert.     Comments: Patient is alert.  Makes eye contact with examiner.  He was able to provide his name place, month, year, why he's here.  Follows basic commands. Left central 7 and tongue deviation. LUE 2/5 deltoid, 3/5 bicep, 2+ tricep,   2- wrist/fingers. LLE 2+ to 3/5. RUE and RLE 4 to 4+/5 prox to distal--stable. Sensory exam normal for light touch and pain in all 4 limbs. No limb ataxia or cerebellar signs. No abnormal tone appreciated.     Assessment/Plan: 1. Functional deficits which require 3+ hours per day of interdisciplinary therapy in a comprehensive inpatient rehab setting. Physiatrist is providing close team supervision and 24 hour management of active medical problems listed below. Physiatrist and rehab team continue to assess barriers to discharge/monitor patient progress toward functional and medical goals  Care Tool:  Bathing    Body parts bathed by patient: Left lower leg, Face, Left arm, Chest, Abdomen, Right upper leg, Buttocks, Front perineal area, Left upper leg, Right lower leg   Body parts bathed by helper: Right arm     Bathing assist Assist Level: Moderate Assistance - Patient 50 - 74%     Upper Body Dressing/Undressing Upper body dressing   What is the patient wearing?: Pull over shirt    Upper body assist Assist Level: Moderate Assistance - Patient 50 - 74%    Lower Body Dressing/Undressing Lower body dressing      What is the patient wearing?: Underwear/pull up, Pants     Lower body assist Assist for lower body dressing: Moderate Assistance - Patient 50 - 74%     Chartered loss adjuster  assist Assist for toileting: Dependent - Patient 0% (w/ stedy)     Transfers Chair/bed transfer  Transfers assist     Chair/bed transfer assist level: Minimal Assistance - Patient > 75% Chair/bed transfer assistive device: Programmer, multimedia   Ambulation assist   Ambulation activity did not occur: Safety/medical concerns  Assist level: Minimal Assistance - Patient > 75% Assistive device: Walker-rolling Max distance: 143f   Walk 10 feet activity   Assist  Walk 10 feet activity did not occur: Safety/medical concerns        Walk 50 feet activity   Assist Walk 50 feet with 2 turns activity did not occur: Safety/medical concerns         Walk 150 feet activity   Assist Walk 150 feet activity did not occur: Safety/medical concerns          Walk 10 feet on uneven surface  activity   Assist Walk 10 feet on uneven surfaces activity did not occur: Safety/medical concerns         Wheelchair     Assist Is the patient using a wheelchair?: Yes Type of Wheelchair: Manual    Wheelchair assist level: Dependent - Patient 0%      Wheelchair 50 feet with 2 turns activity    Assist        Assist Level: Dependent - Patient 0%   Wheelchair 150 feet activity     Assist      Assist Level: Dependent - Patient 0%   Blood pressure (!) 127/55, pulse 67, temperature 99.3 F (37.4 C), resp. rate 16, height '5\' 9"'  (1.753 m), weight 84.6 kg, SpO2 95 %.  Medical Problem List and Plan: 1.  Left-sided weakness functional deficits secondary to right ventral pontine and small left periventricular lacunar infarcts             -patient may shower             -ELOS/Goals: 12/30, CGA goals, Team conference today please see physician documentation under team conference tab, met with team  to discuss problems,progress, and goals. Formulized individual treatment plan based on medical history, underlying problem and comorbidities.   Continue CIR- PT, OT and SLP 2.  Antithrombotics: -DVT/anticoagulation:  Pharmaceutical: Lovenox             -antiplatelet therapy: Aspirin 81 mg daily and Plavix 75 mg day x3 weeks then aspirin alone.  Plavix can be discontinued after last dose 03/29/2021 3. Pain Management: Tylenol as needed 4. Mood/dementia: Aricept 10 mg nightly, Zoloft 100 mg daily             -antipsychotic agents: N/A 5. Neuropsych: This patient is capable of making decisions on his own behalf. 6. Skin/Wound Care: Routine skin checks 7. Fluids/Electrolytes/Nutrition: Routine in and outs with follow-up chemistries 8.  Diabetes mellitus.  Hemoglobin A1c 8.3.  Semglee just adjusted to 10 units daily.  Check blood sugars before meals and at bedtime             -cbg's poorly controlled at present.  CBG (last 3)  Recent Labs     03/23/21 1629 03/23/21 2118 03/24/21 0620  GLUCAP 181* 114* 115*    Improved CBG on Semglee  25U , off Novalog Patient on metformin at home however elevated creatinine limits usage.  started low-dose NovoLog with meals 12/19 remains elevated. Increase semglee to 25 u daily (12/20) Per wife was on glipizide at home may resume if CBG up Creat too hi for  metformin 9.  Dysphagia.  Dysphagia #3 nectar thick liquid diet.  Advance per speech therapy  12/14 should be ready for repeat swallow study  10.  CKD stage IIIa.  Baseline creatinine 1.3-1.8.  12/10- last Cr 1.41 down from 1.60 2 days prior- will monitor   BMP Latest Ref Rng & Units 03/15/2021 03/11/2021 03/10/2021  Glucose 70 - 99 mg/dL 213(H) 170(H) 186(H)  BUN 8 - 23 mg/dL 25(H) 22 23  Creatinine 0.61 - 1.24 mg/dL 1.43(H) 1.41(H) 1.54(H)  Sodium 135 - 145 mmol/L 139 135 138  Potassium 3.5 - 5.1 mmol/L 4.0 4.4 4.7  Chloride 98 - 111 mmol/L 104 105 104  CO2 22 - 32 mmol/L '27 25 24  ' Calcium 8.9 - 10.3 mg/dL 8.7(L) 8.7(L) 8.6(L)   03/15/21 stable --follow up this 12/20 11.  Macrocytic anemia/vitamin B12 deficiency.   -b12 injections x 5 days (thru 12/11)-->oral supplementation -Continue iron supplement for mild iron def anemia 12.  History of prostate cancer with prostatectomy.  Follow-up outpatient.  -12/19 added flomax for urine flow---bp can tolerate 13.  History of adenocarcinoma left lung with resection 2001.  Follow-up outpatient  12/10- pt usually wheezes per daughter- is chronic for him- s/p lobectomy.    14.  Hyperlipidemia.  Lipitor 15.  Constipation.  Senokot S2 tabs twice daily, MiraLAX daily.   +BM 12/18   16.  Mild thrombocytopenia- stable no signs of hemorrhage , monitor while on enoxaparin  CBC Latest Ref Rng & Units 03/15/2021 03/10/2021 03/09/2021  WBC 4.0 - 10.5 K/uL 5.4 6.6 6.6  Hemoglobin 13.0 - 17.0 g/dL 12.2(L) 12.3(L) 11.7(L)  Hematocrit 39.0 - 52.0 % 36.5(L) 36.4(L) 34.9(L)  Platelets 150 - 400 K/uL  110(L) 113(L) 117(L)    17.  Mild conjunctivitis- add tobra eyedrops 1 gtt QID to OD LOS: 12 days A FACE TO FACE EVALUATION WAS PERFORMED  Charlett Blake 03/24/2021, 10:37 AM

## 2021-03-24 NOTE — Progress Notes (Signed)
Occupational Therapy Session Note  Patient Details  Name: [name] MRN: [id number] Date of Birth: [birth year]  Today's Date: 03/24/2021 OT Individual Time: 9509-3267 OT Individual Time Calculation (min): 54 min    Short Term Goals: Week 2:  OT Short Term Goal 1 (Week 2): Pt will don shirt with min A. OT Short Term Goal 2 (Week 2): Pt will donn his LB clothing at min assist level excluding AFO. OT Short Term Goal 3 (Week 2): Pt will use the LUE at a diminshed level for washing the RUE with min facilitation. OT Short Term Goal 4 (Week 2): Pt will complete toilet transfers and toileting tasks with min assist sit to stand.  Skilled Therapeutic Interventions/Progress Updates:    Session 1: [phone]) Pt with no report of pain to start session.  He was able to complete showering and dressing sit to stand during session.  Min assist for transfer to the walk-in shower to sit on the shower seat at min assist level with use of the RW and left AFO.  He was able to complete doffing clothing in sit to stand at min assist level except for TEDs which were max assist.  Pt completed showering with min assist sit to stand and min hand over hand for use of the LUE to wash the right arm and for some of the left and right LEs.  Once complete, he was able to transfer out to the wheelchair at the sink with min assist using the RW for dressing tasks.  Increased time and min assist for donning pullover shirt with mod assist for donning LB clothing sit to stand.  He was able to donn his shoes and shoe with AFO on the left at mod assist level.  Finished session with work on shaving using the Copy and the LUE in sitting with overall mod assist.  He was left sitting in the wheelchair with the call button and phone in reach and safety alarm belt in place.  Family in room as well.    Session 2:  (8338-2505)  Pt with no report of pain during session.  He was taken down to the ortho gym for work on Marketing executive.  Began with use of the UE ergonometer for 4 sets.  LUE was ace bandaged to the handle to assist with grip.  All sets were completed on level 6 resistance with the first being completed using BUEs for 3 mins.  The final 3 sets were completed with isolated use of the LUE only for intervals of 2 mins each.  He was able to complete with min facilitation to avoid shoulder hike compensation for each interval.  Once complete, he was able to transition to the therapy mat in the gym with mod assist stand pivot.  Continued working on LUE neuromuscular re-ed with sustained activation of the LUE on sliding board with therapist holding the board at different heights and positions.  He was instructed to maintain hand on the board while therapist moved it toward and away from pt as well as across his body and to the left side.  He was able to then progress to functional reach to pick up and place clothespins with the LUE while NMES was directed to the digit extensors with use of a switch.  He was able to tolerate stimulation to the wrist and digit extensors at level 35 intensity for 15 mins non-continuous without any adverse reactions.  Finished session with return to the wheelchair  at [facility] assist stand pivot with transfer to the bed at the same level.  He was able to then transition to supine with min assist.  Call button and phone in reach with safety alarm in place.    Therapy Documentation Precautions:  Precautions Precautions: Fall, Other (comment) Precaution Comments: L hemi, posterior bias Restrictions Weight Bearing Restrictions: No  Pain: Pain Assessment Pain Scale: Faces Pain Score: 0-No pain ADL: See Care Tool Section for some details of mobility and selfcare  Therapy/Group: Individual Therapy  Trever Streater,R[place]zen OTR/L 03/24/2021, 12:04 PM

## 2021-03-25 LAB — GLUCOSE, CAPILLARY
Glucose-Capillary: 130 mg/dL — ABNORMAL HIGH (ref 70–99)
Glucose-Capillary: 160 mg/dL — ABNORMAL HIGH (ref 70–99)
Glucose-Capillary: 140 mg/dL — ABNORMAL HIGH (ref 70–99)
Glucose-Capillary: 180 mg/dL — ABNORMAL HIGH (ref 70–99)

## 2021-03-25 NOTE — Progress Notes (Signed)
Physical Therapy Session Note  Patient Details  Name: [name] MRN: [id number] Date of Birth: Nov 18, 1930  Today's Date: 03/25/2021 PT Individual Time: 1120-1200 PT Individual Time Calculation (min): 40 min   Short Term Goals: Week 1:  PT Short Term Goal 1 (Week 1): Pt will perform supine<>sit with min assist PT Short Term Goal 1 - Progress (Week 1): Met PT Short Term Goal 2 (Week 1): Pt will perform sit<>stand with min assist using LRAD PT Short Term Goal 2 - Progress (Week 1): Met PT Short Term Goal 3 (Week 1): Pt will perform bed<>chair transfer using LRAD with min assist PT Short Term Goal 3 - Progress (Week 1): Met PT Short Term Goal 4 (Week 1): Pt will ambulate at least 76f using LRAD with mod assist of 1 PT Short Term Goal 4 - Progress (Week 1): Met PT Short Term Goal 5 (Week 1): Pt will demonstrate improvement in Berg Balance Test by at least 7 points PT Short Term Goal 5 - Progress (Week 1): Met Week 2:  PT Short Term Goal 1 (Week 2): Patient will complete bed mobility with CGA PT Short Term Goal 2 (Week 2): Patient will improve Berg score by at least 7 pointts PT Short Term Goal 3 (Week 2): Patient will ambulate >52fwith MinA Week 3:     Skilled Therapeutic Interventions/Progress Updates:   Pt received sitting in WC and agreeable to PT. [facility]  Gait training with RW x 10046fith min assist with cues for activation of the L hip and decreased trunkal compensation through the R side. No LOB noted.   Dynamic gait training with BUE supported on Parallel bars. Forward/reverse side stepping R and L performed 8ft51fch x sets x 2. Pt then performed forward reverse gait with 1x4board between feet to force improved step width on the LLE.   Nustep reciprocal movement and endurance training x 6 min level 5; cues for attention to the LUE when fatigued for the last 2 minutes. Pt noted to have increased breath rate to 22 per min requiring 5 minute  rest break to return to baseline   [facility] returned to [facility] and [facility] and all needs met.        Therapy Documentation Precautions:  Precautions Precautions: Fall, Other (comment) Precaution Comments: L hemi, posterior bias Restrictions Weight Bearing Restrictions: No    Pain:   denies    Therapy/Group: Individual Therapy  AustLorie Phenix22/2022, 12:46 PM

## 2021-03-25 NOTE — Progress Notes (Signed)
PROGRESS NOTE   Subjective/Complaints:  Pt reports no issues except for being thirsty- will help him get water- no straws allowed, but on thin liquids.   ROS:  Pt denies SOB, abd pain, CP, N/V/C/D, and vision changes   Objective:   DG Swallowing Func-Speech Pathology  Result Date: 03/24/2021 Table formatting from the original result was not included. Objective Swallowing Evaluation: Type of Study: MBS-Modified Barium Swallow Study  Patient Details Name: [name] MRN: [id number] Date of Birth: Sep 06, 1930 Today's Date: 03/24/2021 Time: SLP Start Time (ACUTE ONLY): 1215 -SLP Stop Time (ACUTE ONLY): 2671 SLP Time Calculation (min) (ACUTE ONLY): 15 min Past Medical History: Past Medical History: Diagnosis Date  Adenocarcinoma of left lung, stage 1 ([place]) 2001  T1N0 stage I  adenoca left lung resected 01/03/00   Alzheimer's disease ([place])   CAD (coronary artery disease) 2014  a. 09/2012 Cath: LM nl, LAD 50-60p, D1 60-70 m, D1 50ost, LCX nl, RCA min irregs, EF 55-65%.  Generalized anxiety disorder 01/08/2007  Qualifier: Diagnosis of  By: Diona Browner MD, [name]    History of colonic polyps 07/25/2011  Macular degeneration of both eyes 01/21/2014  Nonmelanoma skin cancer 07/25/2011  Multiple lesions excised face/nose  Pericarditis   a. 09/2012 with effusion and tamponade, s/p window.  b.  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 SCC IN SITU TX CX3  SCC (squamous cell carcinoma) 04/12/2005  RIGHT NECK SUP. SCC IN SITU TX EXC  SCC (squamous cell carcinoma) 04/16/2013  LEFT TEMPLE SCC IN SITU TX CX3 5FU  SCC (squamous cell carcinoma) 04/16/2013  BELOW LEFT EYE SCC IN SITU TX CX3 5FU  SCC (squamous cell carcinoma) 04/16/2013  BELOW RIGHT EYE SCC IN SITU TX CX3 5FU  SCC (squamous cell carcinoma) 08/04/2014  LEFT CHEEK SCC IN SITU TX CX3 5FU  SCC (squamous cell carcinoma) 11/27/2018  LEFT TEMPLE SCC IN SITU TX WITH BX  SCC (squamous cell carcinoma)   SCC (squamous cell carcinoma)   SCC (squamous cell carcinoma)   SCC (squamous cell carcinoma) Well Diff 09/13/2016  Tip of Nose SCC WELL DIFF TX (MOH's), and RIGHT INNER EYE SUP. SCC IN SITU TX TO WATCH  SCC (squamous cell carcinoma) Well Diff 05/30/2017  Right Cheekbone (Cx3,5FU) and Under Left Eye (Cx3,5FU)  Squamous cell carcinoma in situ (SCCIS) 04/12/2005  Right Neck Inf (Cx3), Right Neck Sup (Exc), and Bulb of Nose (Cx3,Exc)  Squamous cell carcinoma in situ (SCCIS) 09/27/2005  Nose (Cx3,Exc)  Squamous cell carcinoma  in situ (SCCIS) 02/18/2013  Below Left Eye (tx p bx)  Squamous cell carcinoma in situ (SCCIS) 04/16/2013  Left Temple (Cx3,5FU), Below Left Eye (Cx3,5FU), Below Right Eye (Cx3,5FU)  Squamous cell carcinoma in situ (SCCIS) 08/04/2014  Left Cheek (Cx3,5FU)  Squamous cell carcinoma in situ (SCCIS) 09/13/2016  Right Inner Eye Sup. 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  Type 2 diabetes mellitus with vascular disease ([place]) 05/21/2007  Qualifier: Diagnosis of  By: Diona Browner MD, [name]    Type 2 DM with CKD stage 3 and hypertension ([place]) 07/16/2015   Unspecified essential hypertension  Past Surgical History: Past Surgical History: Procedure Laterality Date  HERNIA REPAIR    LEFT HEART CATHETERIZATION WITH CORONARY ANGIOGRAM N/A 09/13/2012  Procedure: LEFT HEART CATHETERIZATION WITH CORONARY ANGIOGRAM;  Surgeon: Sherren Mocha, MD;  [facility] C[facility] LAB;  Service: Cardiovascular;  Laterality: N/A;  LOBECTOMY  2001  upper left  PERICARDIAL TAP N/A 09/16/2012  Procedure: PERICARDIAL TAP;  Surgeon: Sinclair Grooms, MD;  Location: [facility] CATH LAB;  Service: Cardiovascular;  Laterality: N/A;  PROSTATECTOMY  2003  SUBXYPHOID PERICARDIAL WINDOW N/A 09/16/2012  Procedure: SUBXYPHOID PERICARDIAL WINDOW;  Surgeon: Rexene Alberts, MD;  Location: [facility] OR;  Service: Thoracic;  Laterality: N/A;  TONSILLECTOMY   HPI: Patient is an 87 y.o. male with PMH: Alzheimer's dementia, DM-2, essential HTN, stage IIIb chronic kidney disease who was admitted to [facility] on 12/4 with acute ischemic CVA after presenting from home to [facility] ED c/o left sided weakness. CXR negative for any active processes, CT head negative for acute intracranial abnormality but MRI brain showing small foci of acute ischemia within ventral right pons and left forntal periventricular white matter; no hemorrhage or mass; diffuse, severe atrophy worst in frontal and parietal lobes.  Subjective: pleasant, alert, sitting in chair in radiology suite  Recommendations for follow up therapy are one component of a multi-disciplinary discharge planning process, led by the attending physician.  Recommendations may be updated based on patient status, additional functional criteria and insurance authorization. Assessment / Plan / Recommendation Clinical Impressions 03/24/2021 Clinical Impression Patient presents with improved swallow function as compared to MBS completed on 12/5. He exhibited a mild oral phase dysphagia with reduced anterior to posterior transit of puree and regular solid textures as well as decreased oral containment  with thin liquids, leading to premature spillage into vallecular sinus. During pharyngeal phase of swallow, patient exhibited swallow initiation delays to level of vallecular sinus with nectar thick liquids, puree solids, regular solids. With thin liquids, patient exhibited trace flash penetration above vocal cords (PAS 2) when drinking via cup sips, but with no instances of aspiration and with full clearance of penetrate from laryngeal vestibule. No significant difference observed when using chin tuck posture versus head neutral posture. When taking straw sip of thin liquids, patient exhibited swallow initiation delay at level of pyriform sinus and sensed aspiration (PAS 7) of mild-moderate amount which occured during the swallow. He did have a strong cough response and although no barium observed in trachea following aspiration event, unable to determine if he coughed it out or if it traveled deeper. When taking bite of puree solids, patient did exhibit an instance of very trace  penetration of what is suspected to have been vallecular sinus residuals from previous thin liquid sip. SLP instructed patient to take cup sip of thin liquids while still masticating graham cracker and he did have an incident of sensed trace aspiration during the swallow (PAS 7). Barium tablet taken whole with nectar thick liquids transisted well orally and pharyngeally, however it did become briefly lodged in upper thoracic portion of esophagus. SLP gave patient a spoonful of puree barium and this helped to fully transit barium tablet. SLP is recommending to upgrade patient to thin liquids and continue with regular solids. SLP is recommending no straws and no mixed consistencies of thin liquids and regular solids (broth soups are ok, but not soups with pieces of solids like vegetables, etc.) If patient would like mixed consistencies, recommend thickening broth to nectar thick consistency. SLP Visit Diagnosis -- Attention and concentration  deficit following -- Frontal lobe and executive function deficit following -- Impact on safety and function --   Treatment Recommendations 03/08/2021 Treatment Recommendations Therapy as outlined in treatment plan below   Prognosis 03/24/2021 Prognosis for Safe Diet Advancement Good Barriers to Reach Goals -- Barriers/Prognosis Comment -- Diet Recommendations 03/24/2021 SLP Diet Recommendations -- Liquid Administration via -- Medication Administration -- Compensations Minimize environmental distractions;Slow rate;Small sips/bites;Other (Comment) Postural Changes --   Other Recommendations 03/24/2021 Recommended Consults -- Oral Care Recommendations Oral care BID Other Recommendations -- Follow Up Recommendations -- Assistance recommended at discharge -- Functional Status Assessment -- Frequency and Duration  03/08/2021 Speech Therapy Frequency (ACUTE ONLY) min 2x/week Treatment Duration 1 week   Oral Phase 03/24/2021 Oral Phase -- Oral - Pudding Teaspoon -- Oral - Pudding Cup -- Oral - Honey Teaspoon -- Oral - Honey Cup -- Oral - Nectar Teaspoon -- Oral - Nectar Cup WFL Oral - Nectar Straw NT Oral - Thin Teaspoon -- Oral - Thin Cup Premature spillage Oral - Thin Straw -- Oral - Puree Reduced posterior propulsion Oral - Mech Soft -- Oral - Regular Impaired mastication;Reduced posterior propulsion Oral - Multi-Consistency -- Oral - Pill Reduced posterior propulsion;Other (Comment) Oral Phase - Comment --  Pharyngeal Phase 03/24/2021 Pharyngeal Phase Impaired Pharyngeal- Pudding Teaspoon -- Pharyngeal -- Pharyngeal- Pudding Cup -- Pharyngeal -- Pharyngeal- Honey Teaspoon -- Pharyngeal -- Pharyngeal- Honey Cup -- Pharyngeal -- Pharyngeal- Nectar Teaspoon -- Pharyngeal -- Pharyngeal- Nectar Cup Delayed swallow initiation-vallecula Pharyngeal -- Pharyngeal- Nectar Straw NT Pharyngeal -- Pharyngeal- Thin Teaspoon -- Pharyngeal -- Pharyngeal- Thin Cup Delayed swallow initiation-pyriform sinuses;Reduced airway/laryngeal  closure;Penetration/Aspiration during swallow;Pharyngeal residue - valleculae Pharyngeal Material enters airway, remains ABOVE vocal cords then ejected out Pharyngeal- Thin Straw Delayed swallow initiation-pyriform sinuses;Reduced airway/laryngeal closure;Penetration/Aspiration during swallow;Moderate aspiration Pharyngeal Material enters airway, passes BELOW cords and not ejected out despite cough attempt by patient Pharyngeal- Puree Delayed swallow initiation-vallecula;Other (Comment) Pharyngeal -- Pharyngeal- Mechanical Soft -- Pharyngeal -- Pharyngeal- Regular Delayed swallow initiation-vallecula Pharyngeal -- Pharyngeal- Multi-consistency -- Pharyngeal -- Pharyngeal- Pill Delayed swallow initiation-vallecula Pharyngeal -- Pharyngeal Comment --  Cervical Esophageal Phase  03/24/2021 Cervical Esophageal Phase Impaired Pudding Teaspoon -- Pudding Cup -- Honey Teaspoon -- Honey Cup -- Nectar Teaspoon -- Nectar Cup Prominent cricopharyngeal segment Nectar Straw Prominent cricopharyngeal segment Thin Teaspoon -- Thin Cup Prominent cricopharyngeal segment Thin Straw Prominent cricopharyngeal segment Puree Prominent cricopharyngeal segment Mechanical Soft -- Regular Prominent cricopharyngeal segment Multi-consistency -- Pill Prominent cricopharyngeal segment Cervical Esophageal Comment appearance of cervical osteophytes (no radiologist present to confirm) which did not impede bolus transit Sonia Baller, MA, CCC-SLP Speech Therapy  No results for input(s): WBC, HGB, HCT, PLT in the last 72 hours.  No results for input(s): NA, K, CL, CO2, GLUCOSE, BUN, CREATININE, CALCIUM in the last 72 hours.    Intake/Output Summary (Last 24 hours) at 03/25/2021 1006 Last data filed at 03/25/2021 0745 Gross per 24 hour  Intake 480 ml  Output 375 ml  Net 105 ml        Physical Exam: Vital Signs Blood pressure 138/62, pulse 77, temperature 98.4 F (36.9 C), temperature source Oral, resp. rate 20,  height 5\' 9"  (1.753 m), weight 82.4 kg, SpO2 98 %.   General: awake, alert, appropriate, sleeping initially- woke to stimuli; NAD HENT: conjugate gaze; oropharynx moist- no crustiness around eyes today- wearing eyeglasses CV: regular rate; no JVD Pulmonary: CTA B/L; no W/R/R- good air movement GI: soft, NT, ND, (+)BS Psychiatric: appropriate Neurological: alert  Musculoskeletal:     Full ROM, No pain with AROM or PROM in the neck, trunk, or extremities. Posture appropriate  Skin:    General: Skin is warm and dry.  Neurological:     Mental Status: He is alert.     Comments: Patient is alert.  Makes eye contact with examiner.  He was able to provide his name place, month, year, why he's here.  Follows basic commands. Left central 7 and tongue deviation. LUE 2/5 deltoid, 3/5 bicep, 2+ tricep,   2- wrist/fingers. LLE 2+ to 3/5. RUE and RLE 4 to 4+/5 prox to distal--stable. Sensory exam normal for light touch and pain in all 4 limbs. No limb ataxia or cerebellar signs. No abnormal tone appreciated.    Assessment/Plan: 1. Functional deficits which require 3+ hours per day of interdisciplinary therapy in a comprehensive inpatient rehab setting. Physiatrist is providing close team supervision and 24 hour management of active medical problems listed below. Physiatrist and rehab team continue to assess barriers to discharge/monitor patient progress toward functional and medical goals  Care Tool:  Bathing    Body parts bathed by patient: Left lower leg, Face, Left arm, Chest, Abdomen, Right upper leg, Buttocks, Front perineal area, Left upper leg, Right lower leg   Body parts bathed by helper: Right arm     Bathing assist Assist Level: Moderate Assistance - Patient 50 - 74%     Upper Body Dressing/Undressing Upper body dressing   What is the patient wearing?: Pull over shirt    Upper body assist Assist Level: Moderate Assistance - Patient 50 - 74%    Lower Body  Dressing/Undressing Lower body dressing      What is the patient wearing?: Underwear/pull up, Pants     Lower body assist Assist for lower body dressing: Moderate Assistance - Patient 50 - 74%     Toileting Toileting    Toileting assist Assist for toileting: Dependent - Patient 0% (w/ stedy)     Transfers Chair/bed transfer  Transfers assist     Chair/bed transfer assist level: Minimal Assistance - Patient > 75% Chair/bed transfer assistive device: Programmer, multimedia   Ambulation assist   Ambulation activity did not occur: Safety/medical concerns  Assist level: Minimal Assistance - Patient > 75% Assistive device: Walker-rolling Max distance: 143ft   Walk 10 feet activity   Assist  Walk 10 feet activity did not occur: Safety/medical concerns        Walk 50 feet activity   Assist Walk 50 feet with 2 turns activity did not occur: Safety/medical concerns  Walk 150 feet activity   Assist Walk 150 feet activity did not occur: Safety/medical concerns         Walk 10 feet on uneven surface  activity   Assist Walk 10 feet on uneven surfaces activity did not occur: Safety/medical concerns         Wheelchair     Assist Is the patient using a wheelchair?: Yes Type of Wheelchair: Manual    Wheelchair assist level: Dependent - Patient 0%      Wheelchair 50 feet with 2 turns activity    Assist        Assist Level: Dependent - Patient 0%   Wheelchair 150 feet activity     Assist      Assist Level: Dependent - Patient 0%   Blood pressure 138/62, pulse 77, temperature 98.4 F (36.9 C), temperature source Oral, resp. rate 20, height 5\' 9"  (1.753 m), weight 82.4 kg, SpO2 98 %.  Medical Problem List and Plan: 1.  Left-sided weakness functional deficits secondary to right ventral pontine and small left periventricular lacunar infarcts             -patient may shower             -ELOS/Goals: 12/30, CGA  goals,   Continue CIR- PT, OT and SLP 2.  Antithrombotics: -DVT/anticoagulation:  Pharmaceutical: Lovenox             -antiplatelet therapy: Aspirin 81 mg daily and Plavix 75 mg day x3 weeks then aspirin alone.  Plavix can be discontinued after last dose 03/29/2021 3. Pain Management: Tylenol as needed 4. Mood/dementia: Aricept 10 mg nightly, Zoloft 100 mg daily             -antipsychotic agents: N/A 5. Neuropsych: This patient is capable of making decisions on his own behalf. 6. Skin/Wound Care: Routine skin checks 7. Fluids/Electrolytes/Nutrition: Routine in and outs with follow-up chemistries 8.  Diabetes mellitus.  Hemoglobin A1c 8.3.  Semglee just adjusted to 10 units daily.  Check blood sugars before meals and at bedtime             -cbg's poorly controlled at present.  CBG (last 3)  Recent Labs    03/24/21 1707 03/24/21 2101 03/25/21 0628  GLUCAP 122* 161* 130*   Improved CBG on Semglee  25U , off Novalog Patient on metformin at home however elevated creatinine limits usage.  started low-dose NovoLog with meals 12/19 remains elevated. Increase semglee to 25 u daily (12/20) Per wife was on glipizide at home may resume if CBG up Creat too hi for metformin  12/22- CBGs looking good- con't regimen- will need insulin teaching prior to d/c, Ordered for qshift 9.  Dysphagia.  Dysphagia #3 nectar thick liquid diet.  Advance per speech therapy  12/14 should be ready for repeat swallow study  10.  CKD stage IIIa.  Baseline creatinine 1.3-1.8.  12/10- last Cr 1.41 down from 1.60 2 days prior- will monitor   BMP Latest Ref Rng & Units 03/15/2021 03/11/2021 03/10/2021  Glucose 70 - 99 mg/dL 213(H) 170(H) 186(H)  BUN 8 - 23 mg/dL 25(H) 22 23  Creatinine 0.61 - 1.24 mg/dL 1.43(H) 1.41(H) 1.54(H)  Sodium 135 - 145 mmol/L 139 135 138  Potassium 3.5 - 5.1 mmol/L 4.0 4.4 4.7  Chloride 98 - 111 mmol/L 104 105 104  CO2 22 - 32 mmol/L 27 25 24   Calcium 8.9 - 10.3 mg/dL 8.7(L) 8.7(L) 8.6(L)    03/15/21 stable --follow up this  12/20 11.  Macrocytic anemia/vitamin B12 deficiency.   -b12 injections x 5 days (thru 12/11)-->oral supplementation -Continue iron supplement for mild iron def anemia 12.  History of prostate cancer with prostatectomy.  Follow-up outpatient.  -12/19 added flomax for urine flow---bp can tolerate 13.  History of adenocarcinoma left lung with resection 2001.  Follow-up outpatient  12/10- pt usually wheezes per daughter- is chronic for him- s/p lobectomy.    14.  Hyperlipidemia.  Lipitor 15.  Constipation.  Senokot S2 tabs twice daily, MiraLAX daily.   +BM 12/18   16.  Mild thrombocytopenia- stable no signs of hemorrhage , monitor while on enoxaparin  CBC Latest Ref Rng & Units 03/15/2021 03/10/2021 03/09/2021  WBC 4.0 - 10.5 K/uL 5.4 6.6 6.6  Hemoglobin 13.0 - 17.0 g/dL 12.2(L) 12.3(L) 11.7(L)  Hematocrit 39.0 - 52.0 % 36.5(L) 36.4(L) 34.9(L)  Platelets 150 - 400 K/uL 110(L) 113(L) 117(L)    17.  Mild conjunctivitis- add tobra eyedrops 1 gtt QID to OD  12/22- looking better on exam today LOS: 13 days A FACE TO FACE EVALUATION WAS PERFORMED  Lazar Tierce 03/25/2021, 10:06 AM

## 2021-03-25 NOTE — Progress Notes (Signed)
Patient ID: [name], male   DOB: [birth year], 87 y.o.   MRN: [id number]  [place] and Wheelchair ordered through [employer].

## 2021-03-25 NOTE — Progress Notes (Signed)
Physical Therapy Session Note  Patient Details  Name: [name] MRN: [id number] Date of Birth: 09-12-1930  Today's Date: 03/25/2021 PT Individual Time: 0955-1100 PT Individual Time Calculation (min): 65 min   Short Term Goals: Week 2:  PT Short Term Goal 1 (Week 2): Patient will complete bed mobility with CGA PT Short Term Goal 2 (Week 2): Patient will improve Berg score by at least 7 pointts PT Short Term Goal 3 (Week 2): Patient will ambulate >66ft with MinA  Skilled Therapeutic Interventions/Progress Updates:    Patient received supine in bed, agreeable to PT. He denies pain. Patient coming to sit edge of bed with supervision. Donning pants with MinA and PT donning shoes + AFO TotalA for time. CGA to stand with RW and finish pulling up pants. CGA stand pivot to wc with RW. Completing morning ADLs seated at sink with set up A + supervision. PT transporting patient in wc to therapy gym for time management and energy conservation. He ambulated 158ft with RW and CGA. B LE ataxia L>R noted, but improving since previous therapy sessions. Patient participating in unit christmas carol sing along. Noted dysdiadochokinesia in B LE, L>R. Difficulty completing bimanual tasks, such as clapping and required verbal cues to engage L UE. Patient returning to room in wc, seatbelt alarm on, call light within reach.   Therapy Documentation Precautions:  Precautions Precautions: Fall, Other (comment) Precaution Comments: L hemi, posterior bias Restrictions Weight Bearing Restrictions: No     Therapy/Group: Individual Therapy  Karoline Caldwell, PT, DPT, CBIS  03/25/2021, 7:30 AM

## 2021-03-25 NOTE — Progress Notes (Signed)
Occupational Therapy Session Note  Patient Details  Name: [name] MRN: [id number] Date of Birth: January 21, 1931  Today's Date: 03/25/2021 OT Individual Time: 1420-1535 OT Individual Time Calculation (min): 75 min    Short Term Goals: Week 2:  OT Short Term Goal 1 (Week 2): Pt will don shirt with min A. OT Short Term Goal 2 (Week 2): Pt will donn his LB clothing at min assist level excluding AFO. OT Short Term Goal 3 (Week 2): Pt will use the LUE at a diminshed level for washing the RUE with min facilitation. OT Short Term Goal 4 (Week 2): Pt will complete toilet transfers and toileting tasks with min assist sit to stand.  Skilled Therapeutic Interventions/Progress Updates:    Pt in bed to start with completion of transfer from supine to sit at the EOB with min assist on the left side.  He then worked on donning his shoes, with setup for the right and min assist for the left with AFO in place.  Stand pivot transfer to the wheelchair was completed next at min assist level using the RW for support.  He was taken to the therapy apartment where he practiced simulated walk-in shower transfers.  He was then able to complete posterior/anterior transfer with use of the RW for support at min assist level based on simulated setup at home.  He reports already having a shower seat that he can use as well, and grab bars in the shower too.  Next, took him to the therapy gym where he transferred to the therapy mat at min assist level using the RW for support.  Worked on transitions from supine to sit on the left side with min guard assist and increased activation of the core and LUE.  He then transitioned to supine with completion of bilateral shoulder flexion movements while holding a therapy ball.  Ace bandage was used to help with maintaining left palm on the ball during movements of shoulder flexion and extension.  Had him work in a plane of 50-110 degrees in supine with focus on maintaining elbow  extension.  Mod facilitation needed at times as he would exhibit difficulty keeping the elbow extended through the movements.   Next had him progress to reaching with the LUE only to pick up a ping pong ball placed in different locations emphasizing shoulder and elbow movements.  Min to mod assist was needed to complete as he fatigued with decreased digit extension noted as well.  Therapist placed NMES to the left forearm for facilitation of full digit and wrist extension.  While in supine pt was instructed to reach toward the ceiling with his hand and maintain elbow extension as stimulation was active on level 35 with on/off time at 10/5.  Min facilitation was needed to complete and maintain elbow extension.  Rest breaks were given throughout the active 10 mins of stimulation where he was just focusing on wrist and digit extension without shoulder movement.  No adverse reactions or pain noted to stimulation.  He completed stand pivot transfer back to the wheelchair from the mat at mod assist level to return to the room.  Decreased forward trunk flexion noted during sit to stand attempt resulting in posterior bias.  He was left sitting up in the wheelchair with the call button and phone in reach and safety alarm belt in place.   Therapy Documentation Precautions:  Precautions Precautions: Fall, Other (comment) Precaution Comments: L hemi, posterior bias Restrictions Weight Bearing Restrictions: No  Pain: Pain Assessment Pain Scale: Faces Pain Score: 0-No pain   Therapy/Group: Individual Therapy  [name],Rollie OTR/L 03/25/2021, 3:58 PM

## 2021-03-26 LAB — GLUCOSE, CAPILLARY
Glucose-Capillary: 151 mg/dL — ABNORMAL HIGH (ref 70–99)
Glucose-Capillary: 174 mg/dL — ABNORMAL HIGH (ref 70–99)
Glucose-Capillary: 215 mg/dL — ABNORMAL HIGH (ref 70–99)
Glucose-Capillary: 239 mg/dL — ABNORMAL HIGH (ref 70–99)

## 2021-03-26 NOTE — Progress Notes (Signed)
Physical Therapy Weekly Progress Note  Patient Details  Name: [name] MRN: [id number] Date of Birth: [birth year]  Beginning of progress report period: March 20, 2021 End of progress report period: March 26, 2021  Today's Date: 03/26/2021 PT Individual Time: 0800-0900 PT Individual Time Calculation (min): 60 min   Patient has met 3 of 3 short term goals.  Patient making slow progress toward his goals. He remains grossly CGA/MinA for functional mobility due to poor balance strategies, L LE ataxia, truncal instability and limited endurance. He is able to ambulate >152f with RW and CGA/MinA. Transfers remain grossly CGA with RW.   Patient continues to demonstrate the following deficits muscle weakness, decreased cardiorespiratoy endurance, decreased initiation, decreased attention, decreased awareness, decreased problem solving, decreased safety awareness, decreased memory, and delayed processing, and decreased sitting balance, decreased standing balance, decreased postural control, hemiplegia, and decreased balance strategies and therefore will continue to benefit from skilled PT intervention to increase functional independence with mobility.  Patient progressing toward long term goals..  Continue plan of care.  PT Short Term Goals Week 2:  PT Short Term Goal 1 (Week 2): Patient will complete bed mobility with CGA PT Short Term Goal 1 - Progress (Week 2): Met PT Short Term Goal 2 (Week 2): Patient will improve Berg score by at least 7 pointts PT Short Term Goal 2 - Progress (Week 2): Met PT Short Term Goal 3 (Week 2): Patient will ambulate >576fwith MinA PT Short Term Goal 3 - Progress (Week 2): Met Week 3:  PT Short Term Goal 1 (Week 3): STG= LTG based on ELOS  Skilled Therapeutic Interventions/Progress Updates:    Patient received supine in bed, agreeable to PT. He denies pain. Able to come sit edge of bed with CGA. Intermittent LOB posteriorly with noted poor righting  reactions. ModA from PT to assist in regaining balance. Donning pants seated with MinA and shoes TotalA. MD in/out for assessment. Patient transferring to wc via stand pivot with RW and CGA. PT transporting patient in wc to therapy gym for time management and energy conservation. He ambulated 8874fith RW and CGA. Noted B ataxia, L >R and general truncal instability. Anterior ball roll out coupled with TA activation pushing down on ball. Blocked practice sit <> stand with BUE support + CGA with noted poor carryover of anterior weight shift for this transition. Patient still with posterior bias when coming to stand. He completed the berg balance scale scoring a 24/56 indicating a statistically significant improvement from prior score, but remains a high fall risk. Discussed findings with patient and he verbalized understanding. Patient ambulating [id number] dayroom. Participating in dynamic standing balance task laying out a table cloth and various crafts to assist in setting up holiday party. Patient returning to room in wc, completing morning ADLs at sink with set up A and supervision. Seatbelt alarm on, call light within reach.    Therapy Documentation Precautions:  Precautions Precautions: Fall, Other (comment) Precaution Comments: L hemi, posterior bias Restrictions Weight Bearing Restrictions: No    Therapy/Group: Individual Therapy  JennDebbora Dus23/2022, 7:29 AM

## 2021-03-26 NOTE — Progress Notes (Signed)
Recreational Therapy Session Note  Patient Details  Name: [name] MRN: [id number] Date of Birth: 04/14/1930 Today's Date: 03/26/2021  Pain: no c/o, c/o eye drainage, MD aware Skilled Therapeutic Interventions/Progress Updates: Session focused dynamic standing balance and BUE use to unfold a plastic tablecloth.  Pt then applied the tablecloth to the tabletop and arranged various craft activities in preparation for the unit holiday activities today.  Pt required contact guard assist.  Therapy/Group: Co-Treatment Zitlaly Malson 03/26/2021, 1:32 PM

## 2021-03-26 NOTE — Progress Notes (Signed)
PROGRESS NOTE   Subjective/Complaints:  Pt reports doing wlel- no drainage from eyes- with drops and feels better.  L hearing aid is dead- asked OT to help charge.   ROS:   Pt denies SOB, abd pain, CP, N/V/C/D, and vision changes    Objective:   No results found. No results for input(s): WBC, HGB, HCT, PLT in the last 72 hours.  No results for input(s): NA, K, CL, CO2, GLUCOSE, BUN, CREATININE, CALCIUM in the last 72 hours.    Intake/Output Summary (Last 24 hours) at 03/26/2021 1005 Last data filed at 03/26/2021 0700 Gross per 24 hour  Intake 60 ml  Output --  Net 60 ml        Physical Exam: Vital Signs Blood pressure 134/69, pulse 74, temperature 98 F (36.7 C), resp. rate 14, height 5\' 9"  (1.753 m), weight 82.4 kg, SpO2 99 %.    General: awake, alert, appropriate, sitting EOB, wearing L AFO- OT in room; NAD HENT: conjugate gaze- no drainage from either eye; oropharynx moist; HOH CV: regular rate; no JVD Pulmonary: CTA B/L; no W/R/R- good air movement GI: soft, NT, ND, (+)BS Psychiatric: appropriate- interactive Neurological: alert-   Musculoskeletal:     Full ROM, No pain with AROM or PROM in the neck, trunk, or extremities. Posture appropriate  Skin:    General: Skin is warm and dry.  Neurological:     Mental Status: He is alert.     Comments: Patient is alert.  Makes eye contact with examiner.  He was able to provide his name place, month, year, why he's here.  Follows basic commands. Left central 7 and tongue deviation. LUE 2/5 deltoid, 3/5 bicep, 2+ tricep,   2- wrist/fingers. LLE 2+ to 3/5. RUE and RLE 4 to 4+/5 prox to distal--stable. Sensory exam normal for light touch and pain in all 4 limbs. No limb ataxia or cerebellar signs. No abnormal tone appreciated.    Assessment/Plan: 1. Functional deficits which require 3+ hours per day of interdisciplinary therapy in a comprehensive inpatient rehab  setting. Physiatrist is providing close team supervision and 24 hour management of active medical problems listed below. Physiatrist and rehab team continue to assess barriers to discharge/monitor patient progress toward functional and medical goals  Care Tool:  Bathing    Body parts bathed by patient: Left lower leg, Face, Left arm, Chest, Abdomen, Right upper leg, Buttocks, Front perineal area, Left upper leg, Right lower leg   Body parts bathed by helper: Right arm     Bathing assist Assist Level: Moderate Assistance - Patient 50 - 74%     Upper Body Dressing/Undressing Upper body dressing   What is the patient wearing?: Pull over shirt    Upper body assist Assist Level: Moderate Assistance - Patient 50 - 74%    Lower Body Dressing/Undressing Lower body dressing      What is the patient wearing?: Underwear/pull up, Pants     Lower body assist Assist for lower body dressing: Moderate Assistance - Patient 50 - 74%     Toileting Toileting    Toileting assist Assist for toileting: Dependent - Patient 0% (w/ stedy)  Transfers Chair/bed transfer  Transfers assist     Chair/bed transfer assist level: Minimal Assistance - Patient > 75% Chair/bed transfer assistive device: Programmer, multimedia   Ambulation assist   Ambulation activity did not occur: Safety/medical concerns  Assist level: Minimal Assistance - Patient > 75% Assistive device: Walker-rolling Max distance: 145ft   Walk 10 feet activity   Assist  Walk 10 feet activity did not occur: Safety/medical concerns        Walk 50 feet activity   Assist Walk 50 feet with 2 turns activity did not occur: Safety/medical concerns         Walk 150 feet activity   Assist Walk 150 feet activity did not occur: Safety/medical concerns         Walk 10 feet on uneven surface  activity   Assist Walk 10 feet on uneven surfaces activity did not occur: Safety/medical concerns          Wheelchair     Assist Is the patient using a wheelchair?: Yes Type of Wheelchair: Manual    Wheelchair assist level: Dependent - Patient 0%      Wheelchair 50 feet with 2 turns activity    Assist        Assist Level: Dependent - Patient 0%   Wheelchair 150 feet activity     Assist      Assist Level: Dependent - Patient 0%   Blood pressure 134/69, pulse 74, temperature 98 F (36.7 C), resp. rate 14, height 5\' 9"  (1.753 m), weight 82.4 kg, SpO2 99 %.  Medical Problem List and Plan: 1.  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  Antithrombotics: -DVT/anticoagulation:  Pharmaceutical: Lovenox             -antiplatelet therapy: Aspirin 81 mg daily and Plavix 75 mg day x3 weeks then aspirin alone.  Plavix can be discontinued after last dose 03/29/2021 3. Pain Management: Tylenol as needed 4. Mood/dementia: Aricept 10 mg nightly, Zoloft 100 mg daily             -antipsychotic agents: N/A 5. Neuropsych: This patient is capable of making decisions on his own behalf. 6. Skin/Wound Care: Routine skin checks 7. Fluids/Electrolytes/Nutrition: Routine in and outs with follow-up chemistries 8.  Diabetes mellitus.  Hemoglobin A1c 8.3.  Semglee just adjusted to 10 units daily.  Check blood sugars before meals and at bedtime             -cbg's poorly controlled at present.  CBG (last 3)  Recent Labs    03/25/21 1636 03/25/21 2213 03/26/21 0609  GLUCAP 140* 180* 151*   Improved CBG on Semglee  25U , off Novalog Patient on metformin at home however elevated creatinine limits usage.  started low-dose NovoLog with meals 12/19 remains elevated. Increase semglee to 25 u daily (12/20) Per wife was on glipizide at home may resume if CBG up Creat too hi for metformin  12/22- CBGs  looking good- con't regimen- will need insulin teaching prior to d/c, Ordered for qshift  12/23- CBGs overall controlled- con't regimen 9.  Dysphagia.  Dysphagia #3 nectar thick liquid diet.  Advance per speech therapy  12/14 should be ready for repeat swallow  study   12/23- no straws by regular and thin diet.  10.  CKD stage IIIa.  Baseline creatinine 1.3-1.8.  12/10- last Cr 1.41 down from 1.60 2 days prior- will monitor   BMP Latest Ref Rng & Units 03/15/2021 03/11/2021 03/10/2021  Glucose 70 - 99 mg/dL 213(H) 170(H) 186(H)  BUN 8 - 23 mg/dL 25(H) 22 23  Creatinine 0.61 - 1.24 mg/dL 1.43(H) 1.41(H) 1.54(H)  Sodium 135 - 145 mmol/L 139 135 138  Potassium 3.5 - 5.1 mmol/L 4.0 4.4 4.7  Chloride 98 - 111 mmol/L 104 105 104  CO2 22 - 32 mmol/L 27 25 24   Calcium 8.9 - 10.3 mg/dL 8.7(L) 8.7(L) 8.6(L)   03/15/21 stable --follow up this 12/20 11.  Macrocytic anemia/vitamin B12 deficiency.   -b12 injections x 5 days (thru 12/11)-->oral supplementation -Continue iron supplement for mild iron def anemia 12.  History of prostate cancer with prostatectomy.  Follow-up outpatient.  -12/19 added flomax for urine flow---bp can tolerate 13.  History of adenocarcinoma left lung with resection 2001.  Follow-up outpatient  12/10- pt usually wheezes per daughter- is chronic for him- s/p lobectomy.    14.  Hyperlipidemia.  Lipitor 15.  Constipation.  Senokot S2 tabs twice daily, MiraLAX daily.   +BM 12/18   16.  Mild thrombocytopenia- stable no signs of hemorrhage , monitor while on enoxaparin  CBC Latest Ref Rng & Units 03/15/2021 03/10/2021 03/09/2021  WBC 4.0 - 10.5 K/uL 5.4 6.6 6.6  Hemoglobin 13.0 - 17.0 g/dL 12.2(L) 12.3(L) 11.7(L)  Hematocrit 39.0 - 52.0 % 36.5(L) 36.4(L) 34.9(L)  Platelets 150 - 400 K/uL 110(L) 113(L) 117(L)    17.  Mild conjunctivitis- add tobra eyedrops 1 gtt QID to OD  12/23- con't drops, but resolved   LOS: 14 days A FACE TO [place] 03/26/2021, 10:05 AM

## 2021-03-26 NOTE — Progress Notes (Signed)
Occupational Therapy Weekly Progress Note  Patient Details  Name: [name] MRN: [id number] Date of Birth: 1930-10-08  Beginning of progress report period: March 19, 2021 End of progress report period: March 26, 2021  Today's Date: 03/26/2021 OT Individual Time: 8325-4982 OT Individual Time Calculation (min): 79 min    Patient has met 1 of 4 short term goals.  [name] continues to make steady progress with OT.  He currently completes all bathing sit to stand at shower level with UB dressing at mod assist and LB dressing at mod assist sit to stand.  He demonstrates increased LUE function to a gross assist level with supervision and diminished level with mod assist for pulling up garments or washing the RUE when bathing.  Gross digit flexion is present at 95% with extension at around 60%.  The index and middle fingers exhibit increased extension with decreased active extension in the ring and little finger.  He continues to progress with transfers and is currently at a min assist level for shower and toilet transfers with use of the RW for support.  Mod assist is needed for stand pivot transfers without an assistive device.  Slight ataxic movement is noted with transfers in the trunk and LEs with pt demonstrating decreased spatial awareness and balance with increased posterior lean present.  Feel overall he is progressing well, however he may need more min to min guard assist for transfers and selfcare at expected discharge on 12/30.  Family education scheduled for 12/28 in preparation for discharge.    Patient continues to demonstrate the following deficits: muscle weakness and muscle paralysis, impaired timing and sequencing, unbalanced muscle activation, and decreased coordination, decreased problem solving and decreased memory, and decreased standing balance, decreased postural control, hemiplegia, and decreased balance strategies and therefore will continue to benefit from skilled  OT intervention to enhance overall performance with BADL and Reduce care partner burden.  Patient not progressing toward long term goals.  See goal revision..  Continue plan of care.  OT Short Term Goals Week 3:  OT Short Term Goal 1 (Week 3): Pt will don shirt with min A. OT Short Term Goal 2 (Week 3): Pt will donn his LB clothing at min assist level excluding AFO. OT Short Term Goal 3 (Week 3): Pt will use the LUE at a diminshed level for washing the RUE with min facilitation.  Skilled Therapeutic Interventions/Progress Updates:    Pt worked on bathing and dressing to begin session.  He was able to transfer to the EOB with min guard and then complete ambulation to the shower bench with min assist using the RW.  He was able to remove clothing with min assist sit to stand prior to shower with completion of bathing at overall min assist.  Mod assist was needed for using the LUE to wash the right arm and shoulder.  He transferred over to the toilet seat for urinating at min assist before transferring over to the wheelchair at min assist level.  He needed mod assist with min instructional cueing for donning a pullover shirt as he initially was trying to place the LUE in the right arm opening with the shirt backwards.  He was able to donn his brief and shorts at mod assist level.  Therapist donned TEDs and then he donned his velcro shoes and AFO at mod assist.  Next, he was taken down to the therapy gym with transfer to the therapy mat at mod assist level stand pivot.  He then worked on LUE digit extension and shoulder flexion on a slanted board.  NMES was applied to the wrist and digit extensors and he was instructed to push his hand up the board when stimulation was active and maintain for 10 seconds.  He was able to complete with supervision and avoidance of shoulder hike.  No adverse reactions to stimulation with intensity at level 40 and on/off at 10/5.  He tolerated 10 mins of stimulation.  Finished  session with return to the room where he remained up in the wheelchair with his son present and call button and phone in reach.  Safety alarm belt in place as well.    Therapy Documentation Precautions:  Precautions Precautions: Fall, Other (comment) Precaution Comments: L hemi, posterior bias Restrictions Weight Bearing Restrictions: No  Pain: Pain Assessment Pain Scale: Faces Pain Score: 0-No pain ADL: See Care Tool Section for some details of mobility and selfcare   Therapy/Group: Individual Therapy  Tamiki Kuba,Lacy OTR/L 03/26/2021, 4:10 PM

## 2021-03-26 NOTE — Progress Notes (Signed)
Speech Language Pathology Daily Session Note  Patient Details  Name: [name] MRN: [id number] Date of Birth: 02/02/1931  Today's Date: 03/26/2021 SLP Individual Time: 8677-3736 SLP Individual Time Calculation (min): 41 min  Short Term Goals: Week 3: SLP Short Term Goal 1 (Week 3): STG=LTG due to ELOS  Skilled Therapeutic Interventions: Skilled ST treatment focused on swallowing goals. Patient required sup A verbal cues for recall of safe swallow strategies and precautions per MBS results, including no mixed consistencies, and swallowing all solids prior to consuming liquids. Pt independently recalled no straw precautions and verbalized appropriate reasoning for why this was recommended due to aspiration event with straw during MBS. SLP discussed each precaution in detail and provided patient with handout emphasizing diet recommendations and precautions. Patient returned education through teach back. Additional copies were provided for family. Patient consumed single sips of thin liquid without overt s/sx of aspiration and clear vocal quality post swallows. Recommend additional visit to assess recall and implementation of safe swallowing precautions prior to signing off on swallowing. Pt in agreement. Patient was left in chair with alarm activated and immediate needs within reach at end of session. Continue per current plan of care.      Pain Pain Assessment Pain Scale: 0-10 Pain Score: 0-No pain  Therapy/Group: Individual Therapy  Patty Sermons 03/26/2021, 9:14 AM

## 2021-03-27 LAB — GLUCOSE, CAPILLARY
Glucose-Capillary: 175 mg/dL — ABNORMAL HIGH (ref 70–99)
Glucose-Capillary: 136 mg/dL — ABNORMAL HIGH (ref 70–99)
Glucose-Capillary: 61 mg/dL — ABNORMAL LOW (ref 70–99)
Glucose-Capillary: 73 mg/dL (ref 70–99)
Glucose-Capillary: 126 mg/dL — ABNORMAL HIGH (ref 70–99)
Glucose-Capillary: 185 mg/dL — ABNORMAL HIGH (ref 70–99)

## 2021-03-27 MED ORDER — GLIPIZIDE 5 MG PO TABS
5.0000 mg | ORAL_TABLET | Freq: Every day | ORAL | Status: DC
  Administered 2021-03-27 – 2021-04-02 (×7): 5 mg via ORAL
  Filled 2021-03-27 (×7): qty 1

## 2021-03-27 NOTE — Significant Event (Signed)
Hypoglycemic Event  CBG: 61  Treatment: 4 oz juice/soda  Symptoms: Pale  Follow-up CBG: Time:  17:08, 1733 CBG Result:73, 126  Possible Reasons for Event: Unknown      [name]

## 2021-03-27 NOTE — Plan of Care (Signed)
Problem: RH Swallowing Goal: LTG Patient will consume least restrictive diet using compensatory strategies with assistance (SLP) Description: LTG:  Patient will consume least restrictive diet using compensatory strategies with assistance (SLP) Outcome: Completed/Met Goal: LTG Patient will participate in dysphagia therapy to increase swallow function with assistance (SLP) Description: LTG:  Patient will participate in dysphagia therapy to increase swallow function with assistance (SLP) Outcome: Completed/Met Goal: LTG Pt will demonstrate functional change in swallow as evidenced by bedside/clinical objective assessment (SLP) Description: LTG: Patient will demonstrate functional change in swallow as evidenced by bedside/clinical objective assessment (SLP) Outcome: Completed/Met   

## 2021-03-27 NOTE — Progress Notes (Signed)
Physical Therapy Session Note  Patient Details  Name: [name] MRN: [id number] Date of Birth: November 12, 1930  Today's Date: 03/27/2021 PT Individual Time: 0867-6195 PT Individual Time Calculation (min): 73 min   Short Term Goals: Week 2:  PT Short Term Goals - Week 2 PT Short Term Goal 1 (Week 2): Patient will complete bed mobility with CGA PT Short Term Goal 1 - Progress (Week 2): Met PT Short Term Goal 2 (Week 2): Patient will improve Berg score by at least 7 pointts PT Short Term Goal 2 - Progress (Week 2): Met PT Short Term Goal 3 (Week 2): Patient will ambulate >11f with MinA PT Short Term Goal 3 - Progress (Week 2): Met PT Short Term Goals - Week 3 PT Short Term Goal 1 (Week 3): STG= LTG based on ELOS  Skilled Therapeutic Interventions/Progress Updates:  Patient seated EOB with son-in-law in room and assisting pt with clothing change on entrance to room. Patient alert and agreeable to PT session.   Patient with no pain complaint throughout session.  Assisted pt with donning of TED hose/shoes with AFO to L foot, requiring Max A.   Therapeutic Activity: Transfers: Patient performed sit<>stand and stand pivot transfers throughout session with CGA/ Min A initially and improving to CGA. Provided verbal cues for forward weight shift to improve balance transfer.  Gait Training:  Patient ambulated >165 ft using RW with CGA. Demonstrated intermittent slide of Ltoe without catch d/t recent capping of toe of shoe. Good heel strike noted with AFO donned, but reduced control of rotation of LE upon heel strike. Provided vc/ tc for increased step height with LLE.  Neuromuscular Re-ed: NMR facilitated during session with focus on standing balance, problem solving, decision making,  safety awareness. Pt guided in bean bag toss to target requiring dynamic balance and stabilization. Pt picks up bags with L hand requiring focus for pinch grip and then transfers to R hand for toss. Good  proactive and reactive balance strategies, good focus to task..  Guided pt in weighted ball retrieval from around therapy gym. Pt is able to collect 7/ 10 balls with minimal cueing for location prior to request for rest break. Final three require extensive vc with pt even going to ball he sees that is not there. Good choices made for safe approach/ exit to each. NMR performed for improvements in motor control and coordination, balance, sequencing, judgement, and self confidence/ efficacy in performing all aspects of mobility at highest level of independence.   Patient seated upright  in w/c at end of session with brakes locked, belt alarm set, and all needs within reach. Pt's grandson in room.         Therapy Documentation Precautions:  Precautions Precautions: Fall, Other (comment) Precaution Comments: L hemi, posterior bias Restrictions Weight Bearing Restrictions: No General:   Pain: Pain Assessment Pain Scale: 0-10 Pain Score: 0-No pain  Therapy/Group: Individual Therapy  JAlger SimonsPT, DPT 03/27/2021, 12:38 PM

## 2021-03-27 NOTE — Progress Notes (Signed)
Speech Language Pathology Discharge Summary  Patient Details  Name: [name] MRN: [id number] Date of Birth: 1930-07-02  Today's Date: 03/27/2021 SLP Individual Time: 1282-0813 SLP Individual Time Calculation (min): 42 min  Skilled Therapeutic Interventions: Skilled ST treatment focused on swallowing goals. SLP assessed pt's recall and implementation of safe swallowing precautions in which patient independently recalled precautions with 100% accuracy, and implemented strategies with modified independence. Patient also verbalized reasoning for WHY such recommendations have been made to minimize risk for aspiration (i.e., no straws, swallow solids prior to consuming liquids, avoid mixed consistencies). Patient consumed single cup sips of thin liquids without overt s/sx of aspiration and has consistently demonstrated cautious PO consumption with strict adherence and compliance to safe swallowing precautions. Recommend continuation of regular textures, thin liquids, and meds whole in puree. All speech and swallowing goals have been achieved. There are no additional needs identified at this time. Plan to sign off from SLP services. Patient verbalized understanding and agreement. Patient was left in bed with alarm activated and immediate needs within reach at end of session.   Patient has met 4 of 4 long term goals.  Patient to discharge at overall Modified Independent level.  Reasons goals not met: N/A   Clinical Impression/Discharge Summary: Patient has made excellent gains and has met 4 of 4 long-term goals this admission due to improved speech intelligibility and oropharyngeal swallow function. Patient consistently implements speech intelligibility strategies to achieve 100% intelligibility at conversation level with modified independence to speak loud, slow, and over exaggerate mouth movements for effective speech. Improvement in oropharyngeal swallow function is clinically supported by repeat  MBS obtained on 03/24/21 as well as tolerance of diet advancement to regular diet and thin liquids with minimal-to-no overt s/sx of aspiration. Pt demonstrates implementation of safe swallowing precautions with modified independence. Patient was able to successfully recall and teach back safe swallowing precautions to minimize aspiration risk with independence and has been provided with handouts for reinforcement of such strategies. Patient and family education is complete and patient to discharge at overall mod I level for speech and swallowing. Patient's care partner is independent to provide the necessary cognitive assistance at discharge. Follow up speech services are not clinically indicated at this time.   Care Partner:  Caregiver Able to Provide Assistance: Yes  Type of Caregiver Assistance: Cognitive  Recommendation:  None     Equipment: None   Reasons for discharge: Treatment goals met   Patient/Family Agrees with Progress Made and Goals Achieved: Yes    Brandii Lakey T Latiana Tomei 03/27/2021, 10:11 AM

## 2021-03-27 NOTE — Progress Notes (Addendum)
PROGRESS NOTE   Subjective/Complaints:  No new issues this morning. Is comfortable. Sleeping well  ROS: Patient denies fever, rash, sore throat, blurred vision, nausea, vomiting, diarrhea, cough, shortness of breath or chest pain, joint or back pain, headache, or mood change.    Objective:   No results found. No results for input(s): WBC, HGB, HCT, PLT in the last 72 hours.  No results for input(s): NA, K, CL, CO2, GLUCOSE, BUN, CREATININE, CALCIUM in the last 72 hours.    Intake/Output Summary (Last 24 hours) at 03/27/2021 1003 Last data filed at 03/27/2021 0700 Gross per 24 hour  Intake 440 ml  Output --  Net 440 ml        Physical Exam: Vital Signs Blood pressure (!) 124/45, pulse 74, temperature 98.2 F (36.8 C), resp. rate 14, height 5\' 9"  (1.753 m), weight 82.4 kg, SpO2 97 %.   Constitutional: No distress . Vital signs reviewed. HEENT: NCAT, EOMI, oral membranes moist Neck: supple Cardiovascular: RRR without murmur. No JVD    Respiratory/Chest: CTA Bilaterally without wheezes or rales. Normal effort    GI/Abdomen: BS +, non-tender, non-distended Ext: no clubbing, cyanosis, or edema Psych: pleasant and cooperative  Musculoskeletal:     Full ROM, No pain with AROM or PROM in the neck, trunk, or extremities. Posture appropriate  Skin:    General: Skin is warm and dry.  Neurological:        Comments: alert, oriented. Follows commands. Fair insight and awareness.  Left central 7 and tongue deviation. LUE 2/5 deltoid, 3/5 bicep, 2+ tricep,   2 wrist/fingers. LLE 3- to 3/5. RUE and RLE 4 to 4+/5 prox to distal--stable. Sensory exam normal for light touch and pain in all 4 limbs. No limb ataxia or cerebellar signs. No abnormal tone appreciated.     Assessment/Plan: 1. Functional deficits which require 3+ hours per day of interdisciplinary therapy in a comprehensive inpatient rehab setting. Physiatrist is  providing close team supervision and 24 hour management of active medical problems listed below. Physiatrist and rehab team continue to assess barriers to discharge/monitor patient progress toward functional and medical goals  Care Tool:  Bathing    Body parts bathed by patient: Left lower leg, Face, Left arm, Chest, Abdomen, Right upper leg, Buttocks, Front perineal area, Left upper leg, Right lower leg   Body parts bathed by helper: Right arm     Bathing assist Assist Level: Minimal Assistance - Patient > 75%     Upper Body Dressing/Undressing Upper body dressing   What is the patient wearing?: Pull over shirt    Upper body assist Assist Level: Moderate Assistance - Patient 50 - 74%    Lower Body Dressing/Undressing Lower body dressing      What is the patient wearing?: Underwear/pull up, Pants     Lower body assist Assist for lower body dressing: Moderate Assistance - Patient 50 - 74%     Toileting Toileting    Toileting assist Assist for toileting: Minimal Assistance - Patient > 75%     Transfers Chair/bed transfer  Transfers assist     Chair/bed transfer assist level: Minimal Assistance - Patient > 75% Chair/bed transfer assistive  device: Museum/gallery exhibitions officer assist   Ambulation activity did not occur: Safety/medical concerns  Assist level: Minimal Assistance - Patient > 75% Assistive device: Walker-rolling Max distance: 157ft   Walk 10 feet activity   Assist  Walk 10 feet activity did not occur: Safety/medical concerns        Walk 50 feet activity   Assist Walk 50 feet with 2 turns activity did not occur: Safety/medical concerns         Walk 150 feet activity   Assist Walk 150 feet activity did not occur: Safety/medical concerns         Walk 10 feet on uneven surface  activity   Assist Walk 10 feet on uneven surfaces activity did not occur: Safety/medical concerns          Wheelchair     Assist Is the patient using a wheelchair?: Yes Type of Wheelchair: Manual    Wheelchair assist level: Dependent - Patient 0%      Wheelchair 50 feet with 2 turns activity    Assist        Assist Level: Dependent - Patient 0%   Wheelchair 150 feet activity     Assist      Assist Level: Dependent - Patient 0%   Blood pressure (!) 124/45, pulse 74, temperature 98.2 F (36.8 C), resp. rate 14, height 5\' 9"  (1.753 m), weight 82.4 kg, SpO2 97 %.  Medical Problem List and Plan: 1.  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  Antithrombotics: -DVT/anticoagulation:  Pharmaceutical: Lovenox             -antiplatelet therapy: Aspirin 81 mg daily and Plavix 75 mg day x3 weeks then aspirin alone.  Plavix can be discontinued after last dose 03/29/2021 3. Pain Management: Tylenol as needed 4. Mood/dementia: Aricept 10 mg nightly, Zoloft 100 mg daily             -antipsychotic agents: N/A 5. Neuropsych: This patient is capable of making decisions on his own behalf. 6. Skin/Wound Care: Routine skin checks 7. Fluids/Electrolytes/Nutrition: Routine in and outs with follow-up chemistries 8.  Diabetes mellitus.  Hemoglobin A1c 8.3.  Semglee just adjusted to 10 units daily.  Check blood sugars before meals and at bedtime             -   CBG (last 3)  Recent Labs    03/26/21 1638 03/26/21 2153 03/27/21 0618  GLUCAP 215* 239* 175*   Improved CBG on Semglee  25U , off Novalog Patient on metformin at home however elevated creatinine limits usage.  started low-dose NovoLog with meals 12/19 remains elevated. Increase semglee to 25 u daily (12/20) Per wife was on glipizide at home may resume if CBG up Creat too hi for metformin  12/22- CBGs looking good-  con't regimen- will need insulin teaching prior to d/c, Ordered for qshift 12/24 fair control until last 24 hours  -add glipizide 5mg  daily and observe   9.  Dysphagia.  Dysphagia #3 nectar thick liquid diet.  Advance per speech therapy  12/14 should be ready for repeat swallow study   12/23- no straws by regular and thin diet.  10.  CKD stage IIIa.  Baseline creatinine 1.3-1.8.  12/10- last Cr 1.41 down from 1.60 2 days prior- will monitor   BMP Latest Ref Rng & Units 03/15/2021 03/11/2021 03/10/2021  Glucose 70 - 99 mg/dL 213(H) 170(H) 186(H)  BUN 8 - 23 mg/dL 25(H) 22 23  Creatinine 0.61 - 1.24 mg/dL 1.43(H) 1.41(H) 1.54(H)  Sodium 135 - 145 mmol/L 139 135 138  Potassium 3.5 - 5.1 mmol/L 4.0 4.4 4.7  Chloride 98 - 111 mmol/L 104 105 104  CO2 22 - 32 mmol/L 27 25 24   Calcium 8.9 - 10.3 mg/dL 8.7(L) 8.7(L) 8.6(L)   03/15/21 stable --follow up this 12/20 11.  Macrocytic anemia/vitamin B12 deficiency.   -b12 injections x 5 days (thru 12/11)-->oral supplementation -Continue iron supplement for mild iron def anemia 12.  History of prostate cancer with prostatectomy.  Follow-up outpatient.  -12/19 added flomax for urine flow---bp can tolerate 13.  History of adenocarcinoma left lung with resection 2001.  Follow-up outpatient  12/10- pt usually wheezes per daughter- is chronic for him- s/p lobectomy.    14.  Hyperlipidemia.  Lipitor 15.  Constipation.  Senokot S2 tabs twice daily, MiraLAX daily.   +BM 12/22   16.  Mild thrombocytopenia- stable no signs of hemorrhage , monitor while on enoxaparin  CBC Latest Ref Rng & Units 03/15/2021 03/10/2021 03/09/2021  WBC 4.0 - 10.5 K/uL 5.4 6.6 6.6  Hemoglobin 13.0 - 17.0 g/dL 12.2(L) 12.3(L) 11.7(L)  Hematocrit 39.0 - 52.0 % 36.5(L) 36.4(L) 34.9(L)  Platelets 150 - 400 K/uL 110(L) 113(L) 117(L)    17.  Mild conjunctivitis- add tobra eyedrops 1 gtt QID to OD  12/23- con't drops, but resolved   LOS: 15 days A FACE TO [place] 03/27/2021, 10:03 AM

## 2021-03-28 LAB — GLUCOSE, CAPILLARY
Glucose-Capillary: 147 mg/dL — ABNORMAL HIGH (ref 70–99)
Glucose-Capillary: 99 mg/dL (ref 70–99)
Glucose-Capillary: 237 mg/dL — ABNORMAL HIGH (ref 70–99)
Glucose-Capillary: 207 mg/dL — ABNORMAL HIGH (ref 70–99)
Glucose-Capillary: 179 mg/dL — ABNORMAL HIGH (ref 70–99)

## 2021-03-29 LAB — GLUCOSE, CAPILLARY
Glucose-Capillary: 166 mg/dL — ABNORMAL HIGH (ref 70–99)
Glucose-Capillary: 105 mg/dL — ABNORMAL HIGH (ref 70–99)
Glucose-Capillary: 194 mg/dL — ABNORMAL HIGH (ref 70–99)
Glucose-Capillary: 151 mg/dL — ABNORMAL HIGH (ref 70–99)

## 2021-03-29 NOTE — Progress Notes (Signed)
PROGRESS NOTE   Subjective/Complaints: Pt reports likes his radio because cannot focus on TV at the distance.  Slept well- LBM yesterday   No more hypoglycemia per pt/nursing.  No issues.    ROS:  Pt denies SOB, abd pain, CP, N/V/C/D, and vision changes   Objective:   No results found. No results for input(s): WBC, HGB, HCT, PLT in the last 72 hours.  No results for input(s): NA, K, CL, CO2, GLUCOSE, BUN, CREATININE, CALCIUM in the last 72 hours.    Intake/Output Summary (Last 24 hours) at 03/29/2021 1037 Last data filed at 03/29/2021 0800 Gross per 24 hour  Intake 720 ml  Output 500 ml  Net 220 ml        Physical Exam: Vital Signs Blood pressure (!) 128/56, pulse 66, temperature 98 F (36.7 C), resp. rate 14, height 5\' 9"  (1.753 m), weight 84.5 kg, SpO2 96 %.    General: awake, alert, appropriate, laying supine in bed; NAD HENT: conjugate gaze; oropharynx moist CV: regular rate; no JVD Pulmonary: CTA B/L; no W/R/R- good air movement GI: soft, NT, ND, (+)BS Psychiatric: appropriate; bright affect Neurological: alert  Ext: no clubbing, cyanosis, or edema Psych: pleasant and cooperative  Musculoskeletal:     Full ROM, No pain with AROM or PROM in the neck, trunk, or extremities. Posture appropriate  Skin:    General: Skin is warm and dry. Ecchymoses on LUE/RUE Neurological:        Comments: alert, oriented. Follows commands. Fair insight and awareness.  Left central 7 and tongue deviation. LUE 2/5 deltoid, 3/5 bicep, 2+ tricep,   2 wrist/fingers. LLE 3- to 3/5. RUE and RLE 4 to 4+/5 prox to distal--stable. Sensory exam normal for light touch and pain in all 4 limbs. No limb ataxia or cerebellar signs. No abnormal tone appreciated.     Assessment/Plan: 1. Functional deficits which require 3+ hours per day of interdisciplinary therapy in a comprehensive inpatient rehab setting. Physiatrist is providing  close team supervision and 24 hour management of active medical problems listed below. Physiatrist and rehab team continue to assess barriers to discharge/monitor patient progress toward functional and medical goals  Care Tool:  Bathing    Body parts bathed by patient: Left lower leg, Face, Left arm, Chest, Abdomen, Right upper leg, Buttocks, Front perineal area, Left upper leg, Right lower leg   Body parts bathed by helper: Right arm     Bathing assist Assist Level: Minimal Assistance - Patient > 75%     Upper Body Dressing/Undressing Upper body dressing   What is the patient wearing?: Pull over shirt    Upper body assist Assist Level: Moderate Assistance - Patient 50 - 74%    Lower Body Dressing/Undressing Lower body dressing      What is the patient wearing?: Underwear/pull up, Pants     Lower body assist Assist for lower body dressing: Moderate Assistance - Patient 50 - 74%     Toileting Toileting    Toileting assist Assist for toileting: Minimal Assistance - Patient > 75%     Transfers Chair/bed transfer  Transfers assist     Chair/bed transfer assist level: Minimal Assistance -  Patient > 75% Chair/bed transfer assistive device: Museum/gallery exhibitions officer assist   Ambulation activity did not occur: Safety/medical concerns  Assist level: Minimal Assistance - Patient > 75% Assistive device: Walker-rolling Max distance: 175ft   Walk 10 feet activity   Assist  Walk 10 feet activity did not occur: Safety/medical concerns        Walk 50 feet activity   Assist Walk 50 feet with 2 turns activity did not occur: Safety/medical concerns         Walk 150 feet activity   Assist Walk 150 feet activity did not occur: Safety/medical concerns         Walk 10 feet on uneven surface  activity   Assist Walk 10 feet on uneven surfaces activity did not occur: Safety/medical concerns         Wheelchair     Assist Is  the patient using a wheelchair?: Yes Type of Wheelchair: Manual    Wheelchair assist level: Dependent - Patient 0%      Wheelchair 50 feet with 2 turns activity    Assist        Assist Level: Dependent - Patient 0%   Wheelchair 150 feet activity     Assist      Assist Level: Dependent - Patient 0%   Blood pressure (!) 128/56, pulse 66, temperature 98 F (36.7 C), resp. rate 14, height 5\' 9"  (1.753 m), weight 84.5 kg, SpO2 96 %.  Medical Problem List and Plan: 1.  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  Antithrombotics: -DVT/anticoagulation:  Pharmaceutical: Lovenox             -antiplatelet therapy: Aspirin 81 mg daily and Plavix 75 mg day x3 weeks then aspirin alone.  Plavix can be discontinued after last dose 03/29/2021 3. Pain Management: Tylenol as needed 4. Mood/dementia: Aricept 10 mg nightly, Zoloft 100 mg daily             -antipsychotic agents: N/A 5. Neuropsych: This patient is capable of making decisions on his own behalf. 6. Skin/Wound Care: Routine skin checks 7. Fluids/Electrolytes/Nutrition: Routine in and outs with follow-up chemistries 8.  Diabetes mellitus.  Hemoglobin A1c 8.3.  Semglee just adjusted to 10 units daily.  Check blood sugars before meals and at bedtime             -   CBG (last 3)  Recent Labs    03/28/21 1620 03/28/21 2141 03/29/21 0628  GLUCAP 207* 179* 166*   Improved CBG on Semglee  25U , off Novalog Patient on metformin at home however elevated creatinine limits usage.  started low-dose NovoLog with meals 12/19 remains elevated. Increase semglee to 25 u daily (12/20) Per wife was on glipizide at home may resume if CBG up Creat too hi for metformin  12/22- CBGs looking good- con't regimen- will need insulin teaching prior to d/c,  Ordered for qshift 12/24 fair control until last 24 hours  -add glipizide 5mg  daily and observe    12/26- CBGs doing better again after glipizide added yesterday.  9.  Dysphagia.  Dysphagia #3 nectar thick liquid diet.  Advance per speech therapy  12/14 should be ready for repeat  swallow study   12/23- no straws by regular and thin diet.  10.  CKD stage IIIa.  Baseline creatinine 1.3-1.8.  12/10- last Cr 1.41 down from 1.60 2 days prior- will monitor  12/26- will recheck labs in AM   BMP Latest Ref Rng & Units 03/15/2021 03/11/2021 03/10/2021  Glucose 70 - 99 mg/dL 213(H) 170(H) 186(H)  BUN 8 - 23 mg/dL 25(H) 22 23  Creatinine 0.61 - 1.24 mg/dL 1.43(H) 1.41(H) 1.54(H)  Sodium 135 - 145 mmol/L 139 135 138  Potassium 3.5 - 5.1 mmol/L 4.0 4.4 4.7  Chloride 98 - 111 mmol/L 104 105 104  CO2 22 - 32 mmol/L 27 25 24   Calcium 8.9 - 10.3 mg/dL 8.7(L) 8.7(L) 8.6(L)   03/15/21 stable --follow up this 12/20 11.  Macrocytic anemia/vitamin B12 deficiency.   -b12 injections x 5 days (thru 12/11)-->oral supplementation -Continue iron supplement for mild iron def anemia 12.  History of prostate cancer with prostatectomy.  Follow-up outpatient.  -12/19 added flomax for urine flow---bp can tolerate 13.  History of adenocarcinoma left lung with resection 2001.  Follow-up outpatient  12/10- pt usually wheezes per daughter- is chronic for him- s/p lobectomy.    14.  Hyperlipidemia.  Lipitor 15.  Constipation.  Senokot S2 tabs twice daily, MiraLAX daily.   +BM 12/22   16.  Mild thrombocytopenia- stable no signs of hemorrhage , monitor while on enoxaparin  CBC Latest Ref Rng & Units 03/15/2021 03/10/2021 03/09/2021  WBC 4.0 - 10.5 K/uL 5.4 6.6 6.6  Hemoglobin 13.0 - 17.0 g/dL 12.2(L) 12.3(L) 11.7(L)  Hematocrit 39.0 - 52.0 % 36.5(L) 36.4(L) 34.9(L)  Platelets 150 - 400 K/uL 110(L) 113(L) 117(L)    12/26- will recheck labs in AM 17.  Mild conjunctivitis- add tobra eyedrops 1 gtt QID to OD  12/23- con't  drops, but resolved   LOS: 17 days A FACE TO [place] 03/29/2021, 10:37 AM

## 2021-03-29 NOTE — Progress Notes (Addendum)
Occupational Therapy Session Note  Patient Details  Name: [name] MRN: [id number] Date of Birth: 10-Nov-1930  Today's Date: 03/29/2021 OT Individual Time: 7062-3762 session 1 OT Individual Time Calculation (min): 74 min  Session 2: 8315-1761 ( 34 mins)   Short Term Goals: Week 3:  OT Short Term Goal 1 (Week 3): Pt will don shirt with min A. OT Short Term Goal 2 (Week 3): Pt will donn his LB clothing at min assist level excluding AFO. OT Short Term Goal 3 (Week 3): Pt will use the LUE at a diminshed level for washing the RUE with min facilitation.  Skilled Therapeutic Interventions/Progress Updates:  Session 1: Pt greeted  supine in bed    agreeable to OT intervention. Session focus on BADL reeducation, functional mobility, LUE AROM and coordination and decreasing overall burden of care.pt completed supine>sit with supervision with pt exiting to L side of bed with increased use of bed features. Pt completed sit<>stand from EOB with Rw and MIN A, amulatory toilet tranfser with rw and MIN A, +BM ( documented in flowsheet), CGA for posterior pericare in standing with set- up of wash cloths. Pt completed ambulaoty tranfser to walkin shower with MIN A. Pt completed bathing from sitting on seat with overall supervision. Pt request to don underwear from shower seat. MIN A to don from shower seat. Ambulaoty transfer from shower>w/c at sink with MIN A with RW. Pt completed UB dressing with set- up and LB dressing with CGA to pull pants up to waist line. Pt completed seated grooming tasks at sink MODI using compensatory stratgies to open ADL items with good carryover. Pt transported to gym with total A for time mgmt where remainder of session to focus LUE coordination, grasp and in hand manipulation skills. Pt completed therapeutic activity where pt was instructed to engage in matching game with LUE with pt instructed to flip over blocks to find matches to facilitate improved LUE grasp and coordination  for higher level ADL participation. Pt using shifting, rotation, translation to turn over blocks and find matches with LUE with overall supervision. Pt transported back to room with total A where pt left up in w/c with alarm belt activated and all needs within reach.                 Session 2: Pt greeted supine in bed reporting fatigue but agreeable to OT intervention. Session focus on BADL reeducation, functional mobility, LUE [place] and functional grasp and decreasing overall burden of care. Pt completed below therex from bed level: Functional grasp and release of compliant cubes with emphasis on 4th and 5th finger activation. Pt also able to work on in Recruitment consultant with ADL items such as translation, shifting, and rotation needing assist from [place] as needed.  Education provided on using tenodesis grasp as needed as present with deficits getting 4th and 5th finger to transition into full flexion, but has good wrist extension. pt able to return demo tenodesis grasp with good carryover. Pt reports need to void bladder, MIN A for functional ambulation from EOB>toilet with rw. CGA for 3/3 toileting tasks with pt returning to EOB with same [place] with RW.  pt left supine in bed with bed alarm activated and all needs within reach.                                       Therapy Documentation  Precautions:  Precautions Precautions: Fall, Other (comment) Precaution Comments: L hemi, posterior bias Restrictions Weight Bearing Restrictions: No  Session 1: Unrated HA reported, no pain intervention needed.  Session 2: no pain reported during session    Therapy/Group: Individual Therapy  Corinne Ports [facility] 03/29/2021, 12:15 PM

## 2021-03-29 NOTE — Progress Notes (Signed)
Physical Therapy Session Note  Patient Details  Name: [name] MRN: [id number] Date of Birth: July 10, 1930  Today's Date: 03/29/2021 PT Individual Time: 1305-1403 PT Individual Time Calculation (min): 58 min   Short Term Goals: Week 3:  PT Short Term Goal 1 (Week 3): STG= LTG based on ELOS  Skilled Therapeutic Interventions/Progress Updates:    Pt received sitting in w/c with his daughter, [name], present and pt agreeable to therapy session. Pt already wearing L LE AFO and shoes. Sit>stand w/c>RW with CGA for steadying. Gait training ~12ft to main therapy gym using RW with CGA for steadying/safety - pt demos improving coordination of L LE with decreased appearance of a "whip" forward in the lower leg, but also has decreased foot clearance with toe drag during swing - improving but still present L hip abductor weakness during stance.  Stair navigation training ascending/descending 4 (6" height) steps 2x using B HRs with cuing to perform reciprocal pattern on ascent targeting L LE NMR and then step-to leading with L LE on descent for safety- cuing to keep L LE abducted (prevent it from scissoring) on descent - requires increased time/effort to bring L foot up onto step for reciprocal pattern on ascent without knee instability when powering up.  Gait training ~159ft to dayroom using RW with CGA - continues to demo gait deviations and improvements as noted above with pt also demoing improving endurance. Pt reports that he already has a wheelchair and RW and does not need these - will speak with SW and pt's family to confirm.  Sit<>stands using RW throughout session with CGA and pt demoing good recall/carryover to push up with R hand from seat without cuing.  Standing with UE support on litegait donned harness. Gait training overground in litegait harness for balance support but not body weight support without UE support while working on resisted walking with pt having to "drag" the litegait  machine to target improved hip stability and increased glute activation 2x164ft - therapist managing the litegait and only providing verbal cuing to patient - with fatigue, pt has worsening L LE foot clearance starting to drag it, continues to have L hip abductor weakness with hip drop during L stance phase and overall impaired balance. Doffed harness.  Gait training ~74ft overground with only L HHA to target balance and L LE NMR with light mod assist for balance - therapist facilitating increased L hip abductor and extensor activation to decrease trendelenburg but minimal improvement - continues to have poor L LE foot clearance during swing.  Gait training ~120ft back to room using RW with CGA - continues to demo impaired L LE foot clearance during swing with more of a toe drag; however, improved coordination. Doffed shoes and AFO and assessed L lower leg with no redness or signs of skin irritation from the brace. Sit>supine using bed features supervision. Pt left supine in bed with needs in reach and bed alarm on.  Therapy Documentation Precautions:  Precautions Precautions: Fall, Other (comment) Precaution Comments: L hemi, posterior bias Restrictions Weight Bearing Restrictions: No   Pain:  Denies pain during session.  Therapy/Group: Individual Therapy  Tawana Scale , PT, DPT, NCS, CSRS  03/29/2021, 12:18 PM

## 2021-03-30 LAB — COMPREHENSIVE METABOLIC PANEL
ALT: 12 U/L (ref 0–44)
AST: 11 U/L — ABNORMAL LOW (ref 15–41)
Albumin: 3 g/dL — ABNORMAL LOW (ref 3.5–5.0)
Alkaline Phosphatase: 65 U/L (ref 38–126)
Anion gap: 5 (ref 5–15)
BUN: 23 mg/dL (ref 8–23)
CO2: 26 mmol/L (ref 22–32)
Calcium: 8.5 mg/dL — ABNORMAL LOW (ref 8.9–10.3)
Chloride: 108 mmol/L (ref 98–111)
Creatinine, Ser: 1.51 mg/dL — ABNORMAL HIGH (ref 0.61–1.24)
GFR, Estimated: 44 mL/min — ABNORMAL LOW (ref >60)
Glucose, Bld: 170 mg/dL — ABNORMAL HIGH (ref 70–99)
Potassium: 4.4 mmol/L (ref 3.5–5.1)
Sodium: 139 mmol/L (ref 135–145)
Total Bilirubin: 0.6 mg/dL (ref 0.3–1.2)
Total Protein: 5.7 g/dL — ABNORMAL LOW (ref 6.5–8.1)

## 2021-03-30 LAB — CBC WITH DIFFERENTIAL/PLATELET
Abs Immature Granulocytes: 0.13 10*3/uL — ABNORMAL HIGH (ref 0.00–0.07)
Basophils Absolute: 0 10*3/uL (ref 0.0–0.1)
Basophils Relative: 0 %
Eosinophils Absolute: 0 10*3/uL (ref 0.0–0.5)
Eosinophils Relative: 1 %
HCT: 31.3 % — ABNORMAL LOW (ref 39.0–52.0)
Hemoglobin: 10.3 g/dL — ABNORMAL LOW (ref 13.0–17.0)
Immature Granulocytes: 2 %
Lymphocytes Relative: 9 %
Lymphs Abs: 0.5 10*3/uL — ABNORMAL LOW (ref 0.7–4.0)
MCH: 33.8 pg (ref 26.0–34.0)
MCHC: 32.9 g/dL (ref 30.0–36.0)
MCV: 102.6 fL — ABNORMAL HIGH (ref 80.0–100.0)
Monocytes Absolute: 1.2 10*3/uL — ABNORMAL HIGH (ref 0.1–1.0)
Monocytes Relative: 22 %
Neutro Abs: 3.8 10*3/uL (ref 1.7–7.7)
Neutrophils Relative %: 66 %
Platelets: 120 10*3/uL — ABNORMAL LOW (ref 150–400)
RBC: 3.05 MIL/uL — ABNORMAL LOW (ref 4.22–5.81)
RDW: 12.8 % (ref 11.5–15.5)
WBC: 5.7 10*3/uL (ref 4.0–10.5)
nRBC: 0 % (ref 0.0–0.2)

## 2021-03-30 LAB — GLUCOSE, CAPILLARY
Glucose-Capillary: 155 mg/dL — ABNORMAL HIGH (ref 70–99)
Glucose-Capillary: 71 mg/dL (ref 70–99)
Glucose-Capillary: 120 mg/dL — ABNORMAL HIGH (ref 70–99)
Glucose-Capillary: 173 mg/dL — ABNORMAL HIGH (ref 70–99)

## 2021-03-30 NOTE — Progress Notes (Signed)
Occupational Therapy Session Note  Patient Details  Name: [name] MRN: [id number] Date of Birth: September 29, 1930  Today's Date: 03/30/2021 OT Individual Time: 1302-1400 OT Individual Time Calculation (min): 58 min    Short Term Goals: Week 3:  OT Short Term Goal 1 (Week 3): Pt will don shirt with min A. OT Short Term Goal 2 (Week 3): Pt will donn his LB clothing at min assist level excluding AFO. OT Short Term Goal 3 (Week 3): Pt will use the LUE at a diminshed level for washing the RUE with min facilitation.  Skilled Therapeutic Interventions/Progress Updates:  Patient met seated in wc in agreement with OT treatment session. 0/10 pain reported at rest and with activity. Patient denied need for ADLs. Total A for wc transport hospital room <> rehab gym. Session with focus on LUE NMR, therapeutic activity and functional mobility with and without RW. Sit to stand from wc and functional mobility from wc <> mat table with CGA to Min A. NMES applied to L wrist extensors with patient tolerating 10 min on level level 40 and on/off at 10/5. No s/s adverse reaction to stimulation. Focus then shifted to gross grasp/release and three-jaw-chuck with blue foam blocks and cone respectively. Patient then instructed to grasp cup with L hand x10 trials and bring to mouth in prep for self-feeding tasks. 1-step verbal commands required to complete theraputty HEP with soft (tan) putty. In standing, patient able to hold playing cards in L hand and utilize 1st digit (IP flexion/extension and abduction to separate cards. Non-affected RUE used to velcro cards on board. Continued difficulty extending 1st, 4th and 5th digits during all tasks. Session concluded with patient lying supine in bed with call bell within reach, bed alarm activated and all needs met.   Therapy Documentation Precautions:  Precautions Precautions: Fall, Other (comment) Precaution Comments: L hemi, posterior bias Restrictions Weight Bearing  Restrictions: No General:    Therapy/Group: Individual Therapy  Kaislyn Gulas R [place] 03/30/2021, 12:44 PM

## 2021-03-30 NOTE — Progress Notes (Signed)
Patient ID: [name], male   DOB: Apr 18, 1930, 87 y.o.   MRN: [id number]  Referral faxed to Neuro OP

## 2021-03-30 NOTE — Progress Notes (Signed)
PROGRESS NOTE   Subjective/Complaints:  Pt reports no low BG's- Doing well- wants the hospital phone at bedside and hearing aid- dropped on floor, but both in good condition- gave ot pt.   ROS:   Pt denies SOB, abd pain, CP, N/V/C/D, and vision changes    Objective:   No results found. Recent Labs    03/30/21 0551  WBC 5.7  HGB 10.3*  HCT 31.3*  PLT 120*    Recent Labs    03/30/21 0551  NA 139  K 4.4  CL 108  CO2 26  GLUCOSE 170*  BUN 23  CREATININE 1.51*  CALCIUM 8.5*      Intake/Output Summary (Last 24 hours) at 03/30/2021 1020 Last data filed at 03/30/2021 0746 Gross per 24 hour  Intake 480 ml  Output 550 ml  Net -70 ml        Physical Exam: Vital Signs Blood pressure (!) 129/54, pulse 69, temperature 98 F (36.7 C), resp. rate 14, height 5\' 9"  (1.753 m), weight 84.5 kg, SpO2 95 %.     General: awake, alert, appropriate, sitting up in bed- radio batteries dead per pt; NAD HENT: conjugate gaze; oropharynx moist- very HOH- wearing hearing aids CV: regular rate; no JVD Pulmonary: CTA B/L; no W/R/R- good air movement GI: soft, NT, ND, (+)BS Psychiatric: appropriate Neurological: alert- Ext: no clubbing, cyanosis, or edema Psych: pleasant and cooperative  Musculoskeletal:     Full ROM, No pain with AROM or PROM in the neck, trunk, or extremities. Posture appropriate  Skin:    General: Skin is warm and dry. Ecchymoses on LUE/RUE Neurological:        Comments: alert, oriented. Follows commands. Fair insight and awareness.  Left central 7 and tongue deviation. LUE 2/5 deltoid, 3/5 bicep, 2+ tricep,   2 wrist/fingers. LLE 3- to 3/5. RUE and RLE 4 to 4+/5 prox to distal--stable. Sensory exam normal for light touch and pain in all 4 limbs. No limb ataxia or cerebellar signs. No abnormal tone appreciated.     Assessment/Plan: 1. Functional deficits which require 3+ hours per day of  interdisciplinary therapy in a comprehensive inpatient rehab setting. Physiatrist is providing close team supervision and 24 hour management of active medical problems listed below. Physiatrist and rehab team continue to assess barriers to discharge/monitor patient progress toward functional and medical goals  Care Tool:  Bathing    Body parts bathed by patient: Left lower leg, Face, Left arm, Chest, Abdomen, Right upper leg, Buttocks, Front perineal area, Left upper leg, Right lower leg, Right arm   Body parts bathed by helper: Right arm     Bathing assist Assist Level: Supervision/Verbal cueing     Upper Body Dressing/Undressing Upper body dressing   What is the patient wearing?: Pull over shirt    Upper body assist Assist Level: Set up assist    Lower Body Dressing/Undressing Lower body dressing      What is the patient wearing?: Underwear/pull up, Pants     Lower body assist Assist for lower body dressing: Minimal Assistance - Patient > 75%     Toileting Toileting    Toileting assist Assist for toileting: Contact  Guard/Touching assist     Transfers Chair/bed transfer  Transfers assist     Chair/bed transfer assist level: Contact Guard/Touching assist Chair/bed transfer assistive device: Armrests, Programmer, multimedia   Ambulation assist   Ambulation activity did not occur: Safety/medical concerns  Assist level: Contact Guard/Touching assist Assistive device: Walker-rolling Max distance: 172ft   Walk 10 feet activity   Assist  Walk 10 feet activity did not occur: Safety/medical concerns        Walk 50 feet activity   Assist Walk 50 feet with 2 turns activity did not occur: Safety/medical concerns         Walk 150 feet activity   Assist Walk 150 feet activity did not occur: Safety/medical concerns         Walk 10 feet on uneven surface  activity   Assist Walk 10 feet on uneven surfaces activity did not occur:  Safety/medical concerns         Wheelchair     Assist Is the patient using a wheelchair?: Yes Type of Wheelchair: Manual    Wheelchair assist level: Dependent - Patient 0%      Wheelchair 50 feet with 2 turns activity    Assist        Assist Level: Dependent - Patient 0%   Wheelchair 150 feet activity     Assist      Assist Level: Dependent - Patient 0%   Blood pressure (!) 129/54, pulse 69, temperature 98 F (36.7 C), resp. rate 14, height 5\' 9"  (1.753 m), weight 84.5 kg, SpO2 95 %.  Medical Problem List and Plan: 1.  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  Antithrombotics: -DVT/anticoagulation:  Pharmaceutical: Lovenox             -antiplatelet therapy: Aspirin 81 mg daily and Plavix 75 mg day x3 weeks then aspirin alone.  Plavix can be discontinued after last dose 03/29/2021 3. Pain Management: Tylenol as needed 4. Mood/dementia: Aricept 10 mg nightly, Zoloft 100 mg daily             -antipsychotic agents: N/A 5. Neuropsych: This patient is capable of making decisions on his own behalf. 6. Skin/Wound Care: Routine skin checks 7. Fluids/Electrolytes/Nutrition: Routine in and outs with follow-up chemistries 8.  Diabetes mellitus.  Hemoglobin A1c 8.3.  Semglee just adjusted to 10 units daily.  Check blood sugars before meals and at bedtime             -   CBG (last 3)  Recent Labs    03/29/21 1621 03/29/21 2114 03/30/21 0623  GLUCAP 194* 151* 155*   Improved CBG on Semglee  25U , off Novalog Patient on metformin at home however elevated creatinine limits usage.  started low-dose NovoLog with meals 12/19 remains elevated. Increase semglee to 25 u daily (12/20) Per wife was on glipizide at home may resume if CBG up Creat too hi for metformin  12/22- CBGs  looking good- con't regimen- will need insulin teaching prior to d/c, Ordered for qshift 12/24 fair control until last 24 hours  -add glipizide 5mg  daily and observe   12/27- CBGs overall a little better- will monitor  9.  Dysphagia.  Dysphagia #3  nectar thick liquid diet.  Advance per speech therapy  12/14 should be ready for repeat swallow study   12/23- no straws by regular and thin diet.  10.  CKD stage IIIa.  Baseline creatinine 1.3-1.8.  12/10- last Cr 1.41 down from 1.60 2 days prior- will monitor  12/26- will recheck labs in AM   BMP Latest Ref Rng & Units 03/30/2021 03/15/2021 03/11/2021  Glucose 70 - 99 mg/dL 170(H) 213(H) 170(H)  BUN 8 - 23 mg/dL 23 25(H) 22  Creatinine 0.61 - 1.24 mg/dL 1.51(H) 1.43(H) 1.41(H)  Sodium 135 - 145 mmol/L 139 139 135  Potassium 3.5 - 5.1 mmol/L 4.4 4.0 4.4  Chloride 98 - 111 mmol/L 108 104 105  CO2 22 - 32 mmol/L 26 27 25   Calcium 8.9 - 10.3 mg/dL 8.5(L) 8.7(L) 8.7(L)   12/27- Cr 1.51 and Bun 23- overall stable- con't to monitor 11.  Macrocytic anemia/vitamin B12 deficiency.   -b12 injections x 5 days (thru 12/11)-->oral supplementation -Continue iron supplement for mild iron def anemia 12.  History of prostate cancer with prostatectomy.  Follow-up outpatient.  -12/19 added flomax for urine flow---bp can tolerate 13.  History of adenocarcinoma left lung with resection 2001.  Follow-up outpatient  12/10- pt usually wheezes per daughter- is chronic for him- s/p lobectomy.    14.  Hyperlipidemia.  Lipitor 15.  Constipation.  Senokot S2 tabs twice daily, MiraLAX daily.  12/27- LBM this AM- doing better     16.  Mild thrombocytopenia- stable no signs of hemorrhage , monitor while on enoxaparin  CBC Latest Ref Rng & Units 03/30/2021 03/15/2021 03/10/2021  WBC 4.0 - 10.5 K/uL 5.7 5.4 6.6  Hemoglobin 13.0 - 17.0 g/dL 10.3(L) 12.2(L) 12.3(L)  Hematocrit 39.0 - 52.0 % 31.3(L) 36.5(L) 36.4(L)  Platelets 150 - 400 K/uL 120(L) 110(L) 113(L)    1/27-  up to 120k- stable- con't to monitor 17.  Mild conjunctivitis- add tobra eyedrops 1 gtt QID to OD  12/23- con't drops, but resolved   LOS: 18 days A FACE TO FACE EVALUATION WAS PERFORMED  [name] 03/30/2021, 10:20 AM

## 2021-03-30 NOTE — Progress Notes (Signed)
Patient ID: [name], male   DOB: 26-Jan-1931, 87 y.o.   MRN: [id number]  Family education scheduled on Thursday,  12/29 1-4 PM

## 2021-03-30 NOTE — Progress Notes (Signed)
Physical Therapy Session Note  Patient Details  Name: KOICHI PLATTE MRN: [id number] Date of Birth: [birth year]  Today's Date: 03/30/2021 PT Individual Time: 0905-1001 and 1630-1700 PT Individual Time Calculation (min): 56 min and 30 min    Short Term Goals: Week 3:  PT Short Term Goal 1 (Week 3): STG= LTG based on ELOS  Skilled Therapeutic Interventions/Progress Updates:    Session 1: Pt received supine in bed and agreeable to therapy session. Supine>sitting L EOB, HOB partially elevated and using bedrail per home set-up, with supervision. Sitting EOB doffed socks, which pulled a scab off dorsal surface of pt's foot causing it to bleed due to his dry skin - placed band aid and notified the nurse - therapist applied lotion to pt's lower legs in other areas to maintain good skin integrity. Donned TED hose on L LE and L LE AFO and B shoes max assist for time. Sit>stand EOB>RW with CGA for steadying - slight LOB but pt able to recover without increased assist.   Gait training ~115ft to main therapy gym using RW with CGA - continues to have slightly longer step lengths with decreased L LE foot clearance though slides through well with toe cap.  Sit>supine with supervision. Supine bridging 2x15reps adding green theraband resistance for increased hip abductor activation on 2nd set - pt with some decreased smoothness and control over the movement but achieves sufficient hip clearance and is able to maintain knees abducted.  Resisted sit>stands from elevated mat using green theraband around waist x10reps with B UE support on RW for balance - transitioned to a lower mat height and pt started to compensate by a significant windswept positioning of hips with R hip anteriorly rotated compared to L hip - removed resistance, provided mirror for visual feedback and manual facilitation for improved hip alignment then repeated x10 reps with B UE support on RW progressed to B HHA to challenge balance with pt  demoing improved midline orientation.   Gait training ~7ft + ~10ft, no AD or UE support, with light mod assist for balance and therapist providing strong facilitation at L hip to prevent trendelenburg during L stance phase, which improves his upright posture (because otherwise has minor anterior trunk flexion).  Gait training back to room using RW as described above. Doffed shoes. Sit>supine supervision. Pt left supine in bed with needs in reach and bed alarm on.   Session 2: Pt received supine in bed and agreeable to therapy session. Supine>sit L EOB, HOB partially elevated and using bedrail, with supervision and verbal cuing for sequencing to increase pt independence via logroll technique. Sitting EOB donned shoes and L LE AFO total assist for time management. R stand pivot to w/c using RW CGA for steadying.  Transported to/from gym in w/c for time management and energy conservation. Simulated ambulatory car transfer, small SUV height, using RW with CGA for steadying balance and min A for managing L LE in/out due to floor height of the simulator. Gait training ~15ft ascending/descending ramp using RW with CGA - pt has good control over AD on this unlevel surface. Dynamic gait training in // bars via R/L side stepping progressed to only L UE support with therapist manually facilitation improved L hip alignment for increased L hip abductor activation. Transported back to room and pt left seated in w/c with needs in reach and seat belt alarm on.   Therapy Documentation Precautions:  Precautions Precautions: Fall, Other (comment) Precaution Comments: L hemi, posterior bias Restrictions Weight Bearing  Restrictions: No   Pain:  Session 1: Denies pain during session, but does report some muscle soreness from yesterday's therapy session.  Session 2: No reports of pain throughout session.   Therapy/Group: Individual Therapy  Tawana Scale , PT, DPT, NCS, CSRS  03/30/2021, 7:48 AM

## 2021-03-30 NOTE — Progress Notes (Signed)
Inpatient Rehabilitation Care Coordinator Discharge Note   Patient Details  Name: [name] MRN: [id number] Date of Birth: Apr 29, 1930   Discharge location: Home  Length of Stay: 21 Days  Discharge activity level: Sup/Min  Home/community participation: spouse and children  Patient response XB:DZHGDJ Literacy - How often do you need to have someone help you when you read instructions, pamphlets, or other written material from your doctor or pharmacy?: Often  Patient response ME:QASTMH Isolation - How often do you feel lonely or isolated from those around you?: Never  Services provided included: SW, Pharmacy, TR, CM, RN, SLP, OT, RD, PT, MD  Financial Services:  Financial Services Utilized: [employer]  Choices offered to/list presented to: patient spouse  Follow-up services arranged:  Outpatient    Outpatient Servicies: [facility]      Patient response to transportation need: Is the patient able to respond to transportation needs?: Yes In the past 12 months, has lack of transportation kept you from medical appointments or from getting medications?: No In the past 12 months, has lack of transportation kept you from meetings, work, or from getting things needed for daily living?: No    Comments (or additional information):  Patient/Family verbalized understanding of follow-up arrangements:  Yes  Individual responsible for coordination of the follow-up plan: spouse or children  Confirmed correct DME delivered: Dyanne Iha 03/30/2021    Dyanne Iha

## 2021-03-31 LAB — GLUCOSE, CAPILLARY
Glucose-Capillary: 171 mg/dL — ABNORMAL HIGH (ref 70–99)
Glucose-Capillary: 57 mg/dL — ABNORMAL LOW (ref 70–99)
Glucose-Capillary: 124 mg/dL — ABNORMAL HIGH (ref 70–99)
Glucose-Capillary: 92 mg/dL (ref 70–99)
Glucose-Capillary: 110 mg/dL — ABNORMAL HIGH (ref 70–99)

## 2021-03-31 LAB — CBC
HCT: 31.1 % — ABNORMAL LOW (ref 39.0–52.0)
Hemoglobin: 10.1 g/dL — ABNORMAL LOW (ref 13.0–17.0)
MCH: 33.6 pg (ref 26.0–34.0)
MCHC: 32.5 g/dL (ref 30.0–36.0)
MCV: 103.3 fL — ABNORMAL HIGH (ref 80.0–100.0)
Platelets: 122 10*3/uL — ABNORMAL LOW (ref 150–400)
RBC: 3.01 MIL/uL — ABNORMAL LOW (ref 4.22–5.81)
RDW: 12.8 % (ref 11.5–15.5)
WBC: 6.2 10*3/uL (ref 4.0–10.5)
nRBC: 0 % (ref 0.0–0.2)

## 2021-03-31 NOTE — Progress Notes (Signed)
Physical Therapy Session Note  Patient Details  Name: [name] MRN: [id number] Date of Birth: [birth year]  Today's Date: 03/31/2021 PT Missed Time: 45 min and 60 min Missed Time Reason: Patient ill (Pt with episode of continuous bloody sputum from mouth early this AM); Patient fatigue   Short Term Goals: Week 2:  PT Short Term Goal 1 (Week 2): Patient will complete bed mobility with CGA PT Short Term Goal 1 - Progress (Week 2): Met PT Short Term Goal 2 (Week 2): Patient will improve Berg score by at least 7 pointts PT Short Term Goal 2 - Progress (Week 2): Met PT Short Term Goal 3 (Week 2): Patient will ambulate >59f with MinA PT Short Term Goal 3 - Progress (Week 2): Met Week 3:  PT Short Term Goal 1 (Week 3): STG= LTG based on ELOS  Skilled Therapeutic Interventions/Progress Updates:  Pt missed 45 min and 60 min of skilled therapy due to fatigue and illness 2/2 experiencing bloody sputum from mouth early this AM. Will re-attempt as schedule and pt availability permits.     Therapy Documentation Precautions:  Precautions Precautions: Fall, Other (comment) Precaution Comments: L hemi, posterior bias Restrictions Weight Bearing Restrictions: No General:    Pain:  No pain complaint this session, however pt does c/o fatigue from lack of sleep.  Therapy/Group: Individual Therapy  JAlger SimonsPT, DPT 03/31/2021, 9:28 PM

## 2021-03-31 NOTE — Patient Care Conference (Signed)
Inpatient RehabilitationTeam Conference and Plan of Care Update Date: 03/31/2021   Time: 10:32 AM    Patient Name: [name]      Medical Record Number: [id number]  Date of Birth: 1930/12/31 Sex: Male         Room/Bed: 4W09C/4W09C-01 Payor Info: Payor: [employer] MEDICARE / Plan: [place] MEDICARE HMO/PPO / Product Type: *No Product type* /    Admit Date/Time:  03/12/2021  3:40 PM  Primary Diagnosis:  Right pontine cerebrovascular accident [facility])  Hospital Problems: Principal Problem:   Right pontine cerebrovascular accident [facility])    Expected Discharge Date: Expected Discharge Date: 04/02/21  Team Members Present: Physician leading conference: Dr. Leeroy Cha Social Worker Present: Erlene Quan, BSW Nurse Present: Dorien Chihuahua, RN PT Present: Alden Hipp, PT OT Present: Clyda Greener, OT SLP Present: Sherren Kerns, SLP     Current Status/Progress Goal Weekly Team Focus  Bowel/Bladder   Pt is continent of bowel and bladder  Pt remain contintent of bowel and bladder  will assess qshift and PRN   Swallow/Nutrition/ Hydration             ADL's   supervision for bathing from shower level, set- up for UB dressing with OH shirts, MIN A for LB dressing with assistance needed to pull pants up to waist line on L side, toileting with MIN A with assistance needed for clothing mgmt, CGA for transsfers with Rw  contact guard to supervision overall  family ed, BADL reeducation, NMR, E stim for LUE, functional mobility   Mobility   CGA/spv bed mob, CGA STS, CGA transfers, CGA gait up to 174ft RW + L AFO  supervision overall at ambulatory level  gait progressions, L NMR, dynaimc standing, transfers, pt/fam ed   Communication             Safety/Cognition/ Behavioral Observations            Pain   Pt denies any pain  Pt will remain free of pain  will asses qshift and PRN   Skin   Pt skin is intact  Pt skin will remain intact  skin assessments qshift and PRN     Discharge  Planning:  D/c home with children to assist during the day, spouse assisting at night   Team Discussion: Doing well until last night; episode of bleeding in mouth. Some nausea this morning with fatigue. Neuro consulted; plavix discontinued. No issues with pain, continent of bowel and bladder.  Patient on target to meet rehab goals: yes, currently need min assist for upper body /lower body care. Able to transfers with min assist wearing a left AFO and using a RW. Ambulates up to 150' with CGA using a RW.   *See Care Plan and progress notes for long and short-term goals.   Revisions to Treatment Plan:  SLP services discontinued   Teaching Needs: Safety, medications, secondary risk management, transfers, toileting, etc.   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, nausea Possible Resolutions to [employer]: rapid response called, lovenox stopped, transition off of plavix, continue aspirin, provide dietary and exercise counseling, monitor spasticity, continue AFO   Continued  Need for Acute Rehabilitation Level of Care: The patient requires daily medical management by a physician with specialized training in physical medicine and rehabilitation for the following reasons: Direction of a multidisciplinary physical rehabilitation program to maximize functional independence : Yes Medical management of patient stability for increased activity during participation in an intensive rehabilitation regime.: Yes Analysis of laboratory values and/or radiology reports with any  subsequent need for medication adjustment and/or medical intervention. : Yes   I attest that I was present, lead the team conference, and concur with the assessment and plan of the team.   Dorien Chihuahua B 03/31/2021, 2:58 PM

## 2021-03-31 NOTE — Progress Notes (Signed)
Occupational Therapy Session Note  Patient Details  Name: [name] MRN: [id number] Date of Birth: 1930/06/14  Today's Date: 03/31/2021 OT Individual Time: 9485-4627 OT Individual Time Calculation (min): 35 min    Short Term Goals: Week 3:  OT Short Term Goal 1 (Week 3): Pt will don shirt with min A. OT Short Term Goal 2 (Week 3): Pt will donn his LB clothing at min assist level excluding AFO. OT Short Term Goal 3 (Week 3): Pt will use the LUE at a diminshed level for washing the RUE with min facilitation.  Skilled Therapeutic Interventions/Progress Updates:    Pt in bed with son present.  Pt reports that he was up earlier today to go to the bathroom and his nose started bleeding again.  He declined OOB this session but was agreeable to working on LUE strengthening tasks in supine.  Had him complete 2 sets of 7-10 reps for left shoulder flexion with HOB flat and min facilitation to maintain external rotation through the movement.  After he gets up to around 80-90 degrees, he wants to internally rotate the arm with slight elbow flexion.  Also incorporated some functional reach from this position with having him reach to grasp something from therapist's hand.  Decreased gross digit extension noted in digits 4-5.  Min facilitation to adduct the arm across his body to target as well.  Finished session with pt resting in the bed and with the call button and phone in reach and safety alarm belt in place.    Therapy Documentation Precautions:  Precautions Precautions: Fall, Other (comment) Precaution Comments: L hemi, posterior bias Restrictions Weight Bearing Restrictions: No  Pain: Pain Assessment Pain Scale: Faces Pain Score: 0-No pain     Therapy/Group: Individual Therapy  Ziyah Cordoba,Manjot OTR/L 03/31/2021, 3:58 PM

## 2021-03-31 NOTE — Progress Notes (Addendum)
Physical Therapy Discharge Summary  Patient Details  Name: [name] MRN: [id number] Date of Birth: 1930/11/21  Today's Date: 04/02/2021    Patient has met 9 of 9 long term goals due to improved activity tolerance, improved balance, improved postural control, increased strength, ability to compensate for deficits, functional use of  left upper extremity and left lower extremity, improved attention, improved awareness, and improved coordination.  Patient to discharge at an ambulatory level  CGA .   Patient's care partner requires assistance to provide the necessary physical and cognitive assistance at discharge, with education performed with wife and daughters to manage pt min assist level ambulatory with RW and AFO.   Reasons goals not met: all PT goals me.t   Recommendation:  Patient will benefit from ongoing skilled PT services in outpatient setting to continue to advance safe functional mobility, address ongoing impairments in balance, coordination, endurance, strength and minimize fall risk.  Equipment: No equipment provided  Reasons for discharge: treatment goals met and discharge from hospital  Patient/family agrees with progress made and goals achieved: Yes  PT Discharge Precautions/Restrictions Precautions Precautions: Fall;Other (comment) Precaution Comments: L hemi, posterior bias Restrictions Weight Bearing Restrictions: No  Pain Pain Assessment Pain Scale: 0-10 Pain Score: 0-No pain Pain Interference  Pain interference Pain Effect on Sleep 1, Pain Interference with Therapy Activities 1, Pain Interference with Day-to-Day activities 1 Vision/Perception  Vision - History Ability to See in Adequate Light: 1 Impaired Perception Perception: Within Functional Limits Praxis Praxis: Intact  Cognition Overall Cognitive Status: History of cognitive impairments - at baseline Arousal/Alertness: Awake/alert Orientation Level: Oriented X4 Focused Attention: Appears  intact Sustained Attention: Appears intact Memory: Impaired Awareness: Appears intact Problem Solving: Appears intact Safety/Judgment: Appears intact Sensation Sensation Light Touch: Appears Intact Hot/Cold: Appears Intact Proprioception: Impaired by gross assessment Stereognosis: Appears Intact Coordination Gross Motor Movements are Fluid and Coordinated: No Fine Motor Movements are Fluid and Coordinated: No Coordination and Movement Description: GM movements impaired due to L hemiplegia Heel Shin Test: dysmetric L LE Motor  Motor Motor: Hemiplegia;Abnormal postural alignment and control Motor - Skilled Clinical Observations: L hemiplegia, posterior bias in stance Motor - Discharge Observations: L hemi UE> LE  Mobility Bed Mobility Bed Mobility: Rolling Right;Rolling Left;Sit to Supine;Supine to Sit Rolling Right: Supervision/verbal cueing Rolling Left: Supervision/Verbal cueing Supine to Sit: Supervision/Verbal cueing Sit to Supine: Supervision/Verbal cueing Transfers Transfers: Sit to Stand;Stand to Sit;Stand Pivot Transfers Sit to Stand: Supervision/Verbal cueing Stand to Sit: Supervision/Verbal cueing Stand Pivot Transfers: Contact Guard/Touching assist Transfer (Assistive device): Rolling walker Locomotion  Gait Ambulation: Yes Gait Assistance: Contact Guard/Touching assist Gait Distance (Feet): 150 Feet Assistive device: Rolling walker Gait Gait: Yes Gait Pattern: Impaired Gait Pattern: Step-through pattern;Decreased stance time - left;Poor foot clearance - left;Scissoring Stairs / Additional Locomotion Stairs: Yes Stairs Assistance: Contact Guard/Touching assist Stair Management Technique: Two rails Number of Stairs: 4 Height of Stairs: 6 Wheelchair Mobility Wheelchair Mobility: No  Trunk/Postural Assessment  Cervical Assessment Cervical Assessment: Exceptions to [facility] Thoracic Assessment Thoracic Assessment: Exceptions to [facility] Lumbar Assessment Lumbar  Assessment: Exceptions to [facility] Postural Control Postural Control: Deficits on evaluation  Balance Balance Balance Assessed: Yes Standardized Balance Assessment Standardized Balance Assessment: Berg Balance Test Static Sitting Balance Static Sitting - Balance Support: Feet supported Static Sitting - Level of Assistance: 5: Stand by assistance Dynamic Sitting Balance Dynamic Sitting - Balance Support: Feet supported Dynamic Sitting - Level of Assistance: 5: Stand by assistance Static Standing Balance Static Standing - Balance Support: During  functional activity;Bilateral upper extremity supported Static Standing - Level of Assistance: 5: Stand by assistance Dynamic Standing Balance Dynamic Standing - Balance Support: During functional activity;Bilateral upper extremity supported Dynamic Standing - Level of Assistance:  (CGA) Extremity Assessment      RLE Assessment RLE Assessment: Exceptions to [facility] General Strength Comments: grossly 4+/5 LLE Assessment LLE Assessment: Exceptions to [facility] General Strength Comments: grossly 3+/5    [name] 03/31/2021, 12:59 PM

## 2021-03-31 NOTE — Significant Event (Signed)
Hypoglycemic Event  CBG: 57  Treatment: 8 oz juice/soda  Symptoms: Pale and Sweaty  Follow-up CBG: Time:1231 CBG Result:124  Possible Reasons for Event: Inadequate meal intake  Comments/MD notified:Per protocol     Davinia Riccardi B Macgregor Aeschliman

## 2021-03-31 NOTE — Progress Notes (Signed)
Patient ID: [name], male   DOB: 1931/02/11, 87 y.o.   MRN: [id number]  Team Conference Report to Patient/Family  Team Conference discussion was reviewed with the patient and caregiver, including goals, any changes in plan of care and target discharge date.  Patient and caregiver express understanding and are in agreement.  The patient has a target discharge date of 04/02/21.  Sw met with patient and family providing conference updates. Family will be present for family edu on tomorrow. No additional questions or concerns.  Dyanne Iha 03/31/2021, 1:54 PM

## 2021-03-31 NOTE — Progress Notes (Signed)
Occupational Therapy Note  Patient Details  Name: [name] MRN: [id number] Date of Birth: 12-09-1930  Pt refused OT treatment secondary to not feeling well from episodes earlier this am and being nauseas earlier.  He did not want to try and get up at this time as he wanted to see if what he just ate would stay down.  Pt missed 45 mins of OT this session.  Will try again later today.  He was in the bed with his daughter in the room as well.    Cheyenne Bordeaux,Pascal OTR/L 03/31/2021, 10:21 AM

## 2021-03-31 NOTE — Progress Notes (Signed)
PROGRESS NOTE   Subjective/Complaints: Had some bleeding in mouth around 5am- Lovenox was stopped, discussed that this should not impact his discharge plans. He is nervous to get up now inc case bleeding resumes  ROS:   Pt denies SOB, abd pain, CP, N/V/C/D, and vision changes, +bleeding in mouth    Objective:   No results found. Recent Labs    03/30/21 0551 03/31/21 0553  WBC 5.7 6.2  HGB 10.3* 10.1*  HCT 31.3* 31.1*  PLT 120* 122*    Recent Labs    03/30/21 0551  NA 139  K 4.4  CL 108  CO2 26  GLUCOSE 170*  BUN 23  CREATININE 1.51*  CALCIUM 8.5*      Intake/Output Summary (Last 24 hours) at 03/31/2021 1035 Last data filed at 03/31/2021 0700 Gross per 24 hour  Intake 336 ml  Output 350 ml  Net -14 ml        Physical Exam: Vital Signs Blood pressure 129/62, pulse 69, temperature 98 F (36.7 C), temperature source Oral, resp. rate 16, height 5\' 9"  (1.753 m), weight 96.6 kg, SpO2 97 %. Gen: no distress, normal appearing HEENT: oral mucosa pink and moist, NCAT Cardio: Reg rate Chest: normal effort, normal rate of breathing Abd: soft, non-distended Ext: no clubbing, cyanosis, or edema Psych: pleasant and cooperative  Musculoskeletal:     Full ROM, No pain with AROM or PROM in the neck, trunk, or extremities. Posture appropriate  Skin:    General: Skin is warm and dry. Ecchymoses on LUE/RUE Neurological:        Comments: alert, oriented. Follows commands. Fair insight and awareness.  Left central 7 and tongue deviation. LUE 2/5 deltoid, 3/5 bicep, 2+ tricep,   2 wrist/fingers. LLE 3- to 3/5. RUE and RLE 4 to 4+/5 prox to distal--stable. Sensory exam normal for light touch and pain in all 4 limbs. No limb ataxia or cerebellar signs. No abnormal tone appreciated.     Assessment/Plan: 1. Functional deficits which require 3+ hours per day of interdisciplinary therapy in a comprehensive inpatient rehab  setting. Physiatrist is providing close team supervision and 24 hour management of active medical problems listed below. Physiatrist and rehab team continue to assess barriers to discharge/monitor patient progress toward functional and medical goals  Care Tool:  Bathing    Body parts bathed by patient: Left lower leg, Face, Left arm, Chest, Abdomen, Right upper leg, Buttocks, Front perineal area, Left upper leg, Right lower leg, Right arm   Body parts bathed by helper: Right arm     Bathing assist Assist Level: Supervision/Verbal cueing     Upper Body Dressing/Undressing Upper body dressing   What is the patient wearing?: Pull over shirt    Upper body assist Assist Level: Set up assist    Lower Body Dressing/Undressing Lower body dressing      What is the patient wearing?: Underwear/pull up, Pants     Lower body assist Assist for lower body dressing: Minimal Assistance - Patient > 75%     Toileting Toileting    Toileting assist Assist for toileting: Contact Guard/Touching assist     Transfers Chair/bed transfer  Transfers assist  Chair/bed transfer assist level: Contact Guard/Touching assist Chair/bed transfer assistive device: Armrests, Programmer, multimedia   Ambulation assist   Ambulation activity did not occur: Safety/medical concerns  Assist level: Contact Guard/Touching assist Assistive device: Walker-rolling Max distance: 146ft   Walk 10 feet activity   Assist  Walk 10 feet activity did not occur: Safety/medical concerns        Walk 50 feet activity   Assist Walk 50 feet with 2 turns activity did not occur: Safety/medical concerns         Walk 150 feet activity   Assist Walk 150 feet activity did not occur: Safety/medical concerns         Walk 10 feet on uneven surface  activity   Assist Walk 10 feet on uneven surfaces activity did not occur: Safety/medical concerns         Wheelchair     Assist Is  the patient using a wheelchair?: Yes Type of Wheelchair: Manual    Wheelchair assist level: Dependent - Patient 0%      Wheelchair 50 feet with 2 turns activity    Assist        Assist Level: Dependent - Patient 0%   Wheelchair 150 feet activity     Assist      Assist Level: Dependent - Patient 0%   Blood pressure 129/62, pulse 69, temperature 98 F (36.7 C), temperature source Oral, resp. rate 16, height 5\' 9"  (1.753 m), weight 96.6 kg, SpO2 97 %.  Medical Problem List and Plan: 1.  Left-sided weakness functional deficits secondary to right ventral pontine and small left periventricular lacunar infarcts             -patient may shower             -ELOS/Goals: 12/30, CGA goals,   Continue CIR- PT, OT and SLP  Team conference today 2.  Impaired mobility: d/c Lovenox given bleeding from mouth             -antiplatelet therapy: Aspirin 81 mg daily and Plavix 75 mg day x3 weeks then aspirin alone.  Plavix can be discontinued after last dose 03/29/2021 3. Pain Management: Tylenol as needed 4. Mood/dementia: Aricept 10 mg nightly, Zoloft 100 mg daily             -antipsychotic agents: N/A 5. Neuropsych: This patient is capable of making decisions on his own behalf. 6. Skin/Wound Care: Routine skin checks 7. Fluids/Electrolytes/Nutrition: Routine in and outs with follow-up chemistries 8.  Diabetes mellitus.  Hemoglobin A1c 8.3.  Semglee just adjusted to 10 units daily.  Check blood sugars before meals and at bedtime             -   CBG (last 3)  Recent Labs    03/30/21 1619 03/30/21 2158 03/31/21 0508  GLUCAP 120* 173* 171*   Improved CBG on Semglee  25U , off Novalog Patient on metformin at home however elevated creatinine limits usage.  started low-dose NovoLog with meals 12/19 remains elevated. Increase semglee to 25 u daily (12/20) Per wife was on glipizide at home may resume if CBG up Creat too hi for metformin  12/22- CBGs looking good- con't regimen- will  need insulin teaching prior to d/c, Ordered for qshift 12/24 fair control until last 24 hours  -add glipizide 5mg  daily and observe   12/27- CBGs overall a little better- will monitor  9.  Dysphagia.  Dysphagia #3 nectar thick liquid diet.  Advance per  speech therapy  12/14 should be ready for repeat swallow study   12/23- no straws by regular and thin diet.  10.  CKD stage IIIa.  Baseline creatinine 1.3-1.8.  12/10- last Cr 1.41 down from 1.60 2 days prior- will monitor  12/26- will recheck labs in AM   BMP Latest Ref Rng & Units 03/30/2021 03/15/2021 03/11/2021  Glucose 70 - 99 mg/dL 170(H) 213(H) 170(H)  BUN 8 - 23 mg/dL 23 25(H) 22  Creatinine 0.61 - 1.24 mg/dL 1.51(H) 1.43(H) 1.41(H)  Sodium 135 - 145 mmol/L 139 139 135  Potassium 3.5 - 5.1 mmol/L 4.4 4.0 4.4  Chloride 98 - 111 mmol/L 108 104 105  CO2 22 - 32 mmol/L 26 27 25   Calcium 8.9 - 10.3 mg/dL 8.5(L) 8.7(L) 8.7(L)   12/27- Cr 1.51 and Bun 23- overall stable- con't to monitor 11.  Macrocytic anemia/vitamin B12 deficiency.   -b12 injections x 5 days (thru 12/11)-->oral supplementation -Continue iron supplement for mild iron def anemia 12.  History of prostate cancer with prostatectomy.  Follow-up outpatient.  -12/19 added flomax for urine flow---bp can tolerate 13.  History of adenocarcinoma left lung with resection 2001.  Follow-up outpatient  12/10- pt usually wheezes per daughter- is chronic for him- s/p lobectomy.    14.  Hyperlipidemia.  Lipitor 15.  Constipation.  Senokot S2 tabs twice daily, MiraLAX daily.  12/27- LBM this AM- doing better     16.  Mild thrombocytopenia- stable no signs of hemorrhage , monitor while on enoxaparin  CBC Latest Ref Rng & Units 03/31/2021 03/30/2021 03/15/2021  WBC 4.0 - 10.5 K/uL 6.2 5.7 5.4  Hemoglobin 13.0 - 17.0 g/dL 10.1(L) 10.3(L) 12.2(L)  Hematocrit 39.0 - 52.0 % 31.1(L) 31.3(L) 36.5(L)  Platelets 150 - 400 K/uL 122(L) 120(L) 110(L)    1/27- up to 120k- stable- con't to  monitor 17.  Mild conjunctivitis- continue tobra eyedrops 1 gtt QID to OD 18. Bleeding from mouth: rapid response called. Lovenox stopped. Hgb repeated and stable.    LOS: 19 days A FACE TO FACE EVALUATION WAS PERFORMED  Martha Clan P Trevonn Hallum 03/31/2021, 10:35 AM

## 2021-03-31 NOTE — Progress Notes (Signed)
Pt was rounded on around 5:15 am. Pt was found spitting more blood with what looked like blood clots into a cup at bedside. RR was contacted again. PA arrived to unit a few minutes later and evaluated pt chart. PA ordered new labs, D/C Lovenox (Plavix D/C 12/26) and RR consulted with PA. Patient is stable, cup at bedside, call light within reach, denies any pain or discomfort.

## 2021-03-31 NOTE — Discharge Summary (Signed)
Physician Discharge Summary  Patient ID: [name] MRN: [id number] DOB/AGE: 85-Dec-1932 85 y.o.  Admit date: 03/12/2021 Discharge date: 04/02/2021  Discharge Diagnoses:  Principal Problem:   Right pontine cerebrovascular accident [facility]) DVT prophylaxis Diabetes mellitus Dysphagia CKD stage III Macrocytic anemia History of prostate cancer with prostatectomy History of adenocarcinoma left lung with resection Hyperlipidemia Mild thrombocytopenia Dementia Epistaxis   Discharged Condition: Stable  Significant Diagnostic Studies: CT Head Wo Contrast  Result Date: 03/07/2021 CLINICAL DATA:  Altered mental status stroke suspected. EXAM: CT HEAD WITHOUT CONTRAST TECHNIQUE: Contiguous axial images were obtained from the base of the skull through the vertex without intravenous contrast. COMPARISON:  No comparison imaging is available, reports from 2004 are noted and reports from 2001 as well. FINDINGS: Brain: No evidence of acute infarction, hemorrhage, hydrocephalus, extra-axial collection or mass lesion/mass effect. Severe cerebral atrophy. Atrophy reported on previous imaging as severe. Vascular: No hyperdense vessel or unexpected calcification. Skull: Normal. Negative for fracture or focal lesion. Sinuses/Orbits: Visualized paranasal sinuses and orbits without acute process. Other: None. IMPRESSION: No acute intracranial pathology. Severe cerebral atrophy. Electronically Signed   By: Zetta Bills M.D.   On: 03/07/2021 19:57   MR ANGIO HEAD WO CONTRAST  Result Date: 03/08/2021 CLINICAL DATA:  Follow-up stroke. Left-sided weakness and facial droop. Pint teen and left frontal infarctions. EXAM: MRA HEAD WITHOUT CONTRAST TECHNIQUE: Angiographic images of the Circle of Willis were acquired using MRA technique without intravenous contrast. COMPARISON:  MRI earlier same day FINDINGS: Anterior circulation: Both internal carotid arteries are widely patent through the skull base and siphon  regions. The anterior and middle cerebral vessels are patent without proximal stenosis, aneurysm or vascular malformation. Posterior circulation: Both vertebral arteries are patent through the foramen magnum to the basilar. No V4 segment stenosis. The basilar artery is tortuous but widely patent. Both superior cerebellar arteries show flow. Both posterior cerebral arteries show flow. The left posterior cerebral artery receives most of it supply from the anterior circulation with a small contribution from the basilar tip. The right posterior cerebral artery arises from the basilar tip and shows a severe stenosis 1.5 cm beyond its origin. Anatomic variants: None other significant. Other: None. IMPRESSION: No intracranial large vessel occlusion. Both vertebral arteries are patent to the basilar. No basilar stenosis. Severe stenosis of the right posterior cerebral artery 1.5 cm beyond its origin, but patent beyond that. Left PCA receives most of it supply from the anterior circulation but does have a small contribution from the basilar tip. No significant anterior circulation finding. Electronically Signed   By: Nelson Chimes M.D.   On: 03/08/2021 08:05   MR BRAIN WO CONTRAST  Result Date: 03/08/2021 CLINICAL DATA:  Left-sided weakness with facial droop EXAM: MRI HEAD WITHOUT CONTRAST TECHNIQUE: Multiplanar, multiecho pulse sequences of the brain and surrounding structures were obtained without intravenous contrast. COMPARISON:  None. FINDINGS: Brain: There is a small focus of abnormal diffusion restriction the ventral right pons. There is also a punctate focus of diffusion restriction in the left frontal periventricular white matter. No acute or chronic hemorrhage. There is multifocal hyperintense T2-weighted signal within the white matter. Diffuse, severe atrophy, worst in the frontal and parietal lobes. Bilateral subdural hygromas. The midline structures are normal. Vascular: Major flow voids are preserved. Skull  and upper cervical spine: Normal calvarium and skull base. Visualized upper cervical spine and soft tissues are normal. Sinuses/Orbits:No paranasal sinus fluid levels or advanced mucosal thickening. No mastoid or middle ear effusion. Normal orbits. IMPRESSION:  1. Small foci of acute ischemia within the ventral right pons and left frontal periventricular white matter. No hemorrhage or mass effect. 2. Diffuse, severe atrophy, worst in the frontal and parietal lobes. 3. Bilateral subdural hygromas. Electronically Signed   By: Ulyses Jarred M.D.   On: 03/08/2021 00:30   DG Chest Portable 1 View  Result Date: 03/07/2021 CLINICAL DATA:  Shortness of breath. Left-sided weakness. Stroke presentation. EXAM: PORTABLE CHEST 1 VIEW COMPARISON:  05/18/2020 FINDINGS: The right lung is clear. There is probably been lobectomy on the left. There is chronic pleural and parenchymal scarring on the left. No sign of active infiltrate, mass, effusion or collapse. No acute bone finding. IMPRESSION: Status post lobectomy on the left with chronic scarring. No active process evident. Electronically Signed   By: Nelson Chimes M.D.   On: 03/07/2021 20:10   DG Swallowing Func-Speech Pathology  Result Date: 03/24/2021 Table formatting from the original result was not included. Objective Swallowing Evaluation: Type of Study: MBS-Modified Barium Swallow Study  Patient Details Name: [name] MRN: [id number] Date of Birth: 08/09/1930 Today's Date: 03/24/2021 Time: SLP Start Time (ACUTE ONLY): 1215 -SLP Stop Time (ACUTE ONLY): 7782 SLP Time Calculation (min) (ACUTE ONLY): 15 min Past Medical History: Past Medical History: Diagnosis Date  Adenocarcinoma of left lung, stage 1 ([place]) 2001  T1N0 stage I  adenoca left lung resected 01/03/00   Alzheimer's disease ([place])   CAD (coronary artery disease) 2014  a. 09/2012 Cath: LM nl, LAD 50-60p, D1 60-70 m, D1 50ost, LCX nl, RCA min irregs, EF 55-65%.  Generalized anxiety disorder 01/08/2007   Qualifier: Diagnosis of  By: Diona Browner MD, [name]    History of colonic polyps 07/25/2011  Macular degeneration of both eyes 01/21/2014  Nonmelanoma skin cancer 07/25/2011  Multiple lesions excised face/nose  Pericarditis   a. 09/2012 with effusion and tamponade, s/p window.  b.  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 SCC IN SITU TX CX3  SCC (squamous cell carcinoma) 04/12/2005  RIGHT NECK SUP. SCC IN SITU TX EXC  SCC (squamous cell carcinoma) 04/16/2013  LEFT TEMPLE SCC IN SITU TX CX3 5FU  SCC (squamous cell carcinoma) 04/16/2013  BELOW LEFT EYE SCC IN SITU TX CX3 5FU  SCC (squamous cell carcinoma) 04/16/2013  BELOW RIGHT EYE SCC IN SITU TX CX3 5FU  SCC (squamous cell carcinoma) 08/04/2014  LEFT CHEEK SCC IN SITU TX CX3 5FU  SCC (squamous cell carcinoma) 11/27/2018  LEFT TEMPLE SCC IN SITU TX WITH BX  SCC (squamous cell carcinoma)   SCC (squamous cell carcinoma)   SCC (squamous cell carcinoma)   SCC (squamous cell carcinoma) Well Diff 09/13/2016  Tip of Nose SCC WELL DIFF TX (MOH's), and RIGHT INNER EYE SUP. 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  Type 2 diabetes mellitus with vascular disease ([place]) 05/21/2007  Qualifier: Diagnosis of  By: Diona Browner MD, [name]    Type 2 DM with CKD stage 3 and hypertension ([place]) 07/16/2015  Unspecified essential hypertension  Past Surgical History: Past Surgical History: Procedure Laterality Date  HERNIA REPAIR    LEFT HEART CATHETERIZATION WITH CORONARY ANGIOGRAM N/A 09/13/2012  Procedure: LEFT HEART CATHETERIZATION WITH CORONARY ANGIOGRAM;  Surgeon: Sherren Mocha, MD;  Location: [facility] C[facility] LAB;  Service: Cardiovascular;  Laterality: N/A;  LOBECTOMY  2001  upper left  PERICARDIAL TAP N/A 09/16/2012  Procedure: PERICARDIAL TAP;  Surgeon: Sinclair Grooms, MD;  Location: [facility] CATH LAB;  Service: Cardiovascular;  Laterality: N/A;  PROSTATECTOMY  2003  SUBXYPHOID PERICARDIAL WINDOW N/A 09/16/2012  Procedure: SUBXYPHOID PERICARDIAL WINDOW;  Surgeon: Rexene Alberts, MD;  Location: [facility] OR;  Service: Thoracic;  Laterality: N/A;  TONSILLECTOMY   HPI: Patient is an 87 y.o. male with PMH: Alzheimer's dementia, DM-2, essential HTN, stage IIIb chronic kidney disease who was admitted to [facility] on 12/4 with acute ischemic CVA after presenting from home to [facility] ED c/o left sided weakness. CXR negative for any active processes, CT head negative for acute intracranial abnormality but MRI brain  showing small foci of acute ischemia within ventral right pons and left forntal periventricular white matter; no hemorrhage or mass; diffuse, severe atrophy worst in frontal and parietal lobes.  Subjective: pleasant, alert, sitting in chair in radiology suite  Recommendations for follow up therapy are one component of a multi-disciplinary discharge planning process, led by the attending physician.  Recommendations may be updated based on patient status, additional functional criteria and insurance authorization. Assessment / Plan / Recommendation Clinical Impressions 03/24/2021 Clinical Impression Patient presents with improved swallow function as compared to MBS completed on 12/5. He exhibited a mild oral phase dysphagia with reduced anterior to posterior transit of puree and regular solid textures as well as decreased oral containment with thin liquids, leading to premature spillage into vallecular sinus. During pharyngeal phase of swallow, patient exhibited swallow initiation delays to level of vallecular sinus with nectar thick liquids, puree solids, regular solids. With thin liquids, patient exhibited trace flash penetration above vocal cords (PAS 2) when drinking via cup sips, but with no instances of aspiration and with full clearance of penetrate from laryngeal vestibule. No significant difference observed when using chin tuck posture versus head neutral posture. When taking straw sip of thin liquids, patient exhibited swallow initiation delay at level of pyriform sinus and sensed aspiration (PAS 7) of mild-moderate amount which occured  during the swallow. He did have a strong cough response and although no barium observed in trachea following aspiration event, unable to determine if he coughed it out or if it traveled deeper. When taking bite of puree solids, patient did exhibit an instance of very trace penetration of what is suspected to have been vallecular sinus residuals from previous thin liquid sip.  SLP instructed patient to take cup sip of thin liquids while still masticating graham cracker and he did have an incident of sensed trace aspiration during the swallow (PAS 7). Barium tablet taken whole with nectar thick liquids transisted well orally and pharyngeally, however it did become briefly lodged in upper thoracic portion of esophagus. SLP gave patient a spoonful of puree barium and this helped to fully transit barium tablet. SLP is recommending to upgrade patient to thin liquids and continue with regular solids. SLP is recommending no straws and no mixed consistencies of thin liquids and regular solids (broth soups are ok, but not soups with pieces of solids like vegetables, etc.) If patient would like mixed consistencies, recommend thickening broth to nectar thick consistency. SLP Visit Diagnosis -- Attention and concentration deficit following -- Frontal lobe and executive function deficit following -- Impact on safety and function --   Treatment Recommendations 03/08/2021 Treatment Recommendations Therapy as outlined in treatment plan below   Prognosis 03/24/2021 Prognosis for Safe Diet Advancement Good Barriers to Reach Goals -- Barriers/Prognosis Comment -- Diet Recommendations 03/24/2021 SLP Diet Recommendations -- Liquid Administration via -- Medication Administration -- Compensations Minimize environmental distractions;Slow rate;Small sips/bites;Other (Comment) Postural Changes --   Other Recommendations 03/24/2021 Recommended Consults -- Oral Care Recommendations Oral care BID Other Recommendations -- Follow Up Recommendations -- Assistance recommended at discharge -- Functional Status Assessment -- Frequency and Duration  03/08/2021 Speech Therapy Frequency (ACUTE ONLY) min 2x/week Treatment Duration 1 week   Oral Phase 03/24/2021 Oral Phase -- Oral - Pudding Teaspoon -- Oral - Pudding Cup -- Oral - Honey Teaspoon -- Oral - Honey Cup -- Oral - Nectar Teaspoon -- Oral - Nectar Cup WFL Oral - Nectar  Straw NT Oral - Thin Teaspoon -- Oral - Thin Cup Premature spillage Oral - Thin Straw -- Oral - Puree Reduced posterior propulsion Oral - Mech Soft -- Oral - Regular Impaired mastication;Reduced posterior propulsion Oral - Multi-Consistency -- Oral - Pill Reduced posterior propulsion;Other (Comment) Oral Phase - Comment --  Pharyngeal Phase 03/24/2021 Pharyngeal Phase Impaired Pharyngeal- Pudding Teaspoon -- Pharyngeal -- Pharyngeal- Pudding Cup -- Pharyngeal -- Pharyngeal- Honey Teaspoon -- Pharyngeal -- Pharyngeal- Honey Cup -- Pharyngeal -- Pharyngeal- Nectar Teaspoon -- Pharyngeal -- Pharyngeal- Nectar Cup Delayed swallow initiation-vallecula Pharyngeal -- Pharyngeal- Nectar Straw NT Pharyngeal -- Pharyngeal- Thin Teaspoon -- Pharyngeal -- Pharyngeal- Thin Cup Delayed swallow initiation-pyriform sinuses;Reduced airway/laryngeal closure;Penetration/Aspiration during swallow;Pharyngeal residue - valleculae Pharyngeal Material enters airway, remains ABOVE vocal cords then ejected out Pharyngeal- Thin Straw Delayed swallow initiation-pyriform sinuses;Reduced airway/laryngeal closure;Penetration/Aspiration during swallow;Moderate aspiration Pharyngeal Material enters airway, passes BELOW cords and not ejected out despite cough attempt by patient Pharyngeal- Puree Delayed swallow initiation-vallecula;Other (Comment) Pharyngeal -- Pharyngeal- Mechanical Soft -- Pharyngeal -- Pharyngeal- Regular Delayed swallow initiation-vallecula Pharyngeal -- Pharyngeal- Multi-consistency -- Pharyngeal -- Pharyngeal- Pill Delayed swallow initiation-vallecula Pharyngeal -- Pharyngeal Comment --  Cervical Esophageal Phase  03/24/2021 Cervical Esophageal Phase Impaired Pudding Teaspoon -- Pudding Cup -- Honey Teaspoon -- Honey Cup -- Nectar Teaspoon -- Nectar Cup Prominent cricopharyngeal segment Nectar Straw Prominent cricopharyngeal segment Thin Teaspoon -- Thin Cup Prominent cricopharyngeal segment  Thin Straw Prominent  cricopharyngeal segment Puree Prominent cricopharyngeal segment Mechanical Soft -- Regular Prominent cricopharyngeal segment Multi-consistency -- Pill Prominent cricopharyngeal segment Cervical Esophageal Comment appearance of cervical osteophytes (no radiologist present to confirm) which did not impede bolus transit Sonia Baller, MA, CCC-SLP Speech Therapy                     DG Swallowing Func-Speech Pathology  Result Date: 03/08/2021 Table formatting from the original result was not included. Objective Swallowing Evaluation: Type of Study: MBS-Modified Barium Swallow Study  Patient Details Name: [name] MRN: [id number] Date of Birth: 1930-04-27 Today's Date: 03/08/2021 Time: SLP Start Time (ACUTE ONLY): 73 -SLP Stop Time (ACUTE ONLY): 1320 SLP Time Calculation (min) (ACUTE ONLY): 20 min Past Medical History: Past Medical History: Diagnosis Date  Adenocarcinoma of left lung, stage 1 ([place]) 2001  T1N0 stage I  adenoca left lung resected 01/03/00   Alzheimer's disease ([place])   CAD (coronary artery disease) 2014  a. 09/2012 Cath: LM nl, LAD 50-60p, D1 60-70 m, D1 50ost, LCX nl, RCA min irregs, EF 55-65%.  Generalized anxiety disorder 01/08/2007  Qualifier: Diagnosis of  By: Diona Browner MD, [name]    History of colonic polyps 07/25/2011  Macular degeneration of both eyes 01/21/2014  Nonmelanoma skin cancer 07/25/2011  Multiple lesions excised face/nose  Pericarditis   a. 09/2012 with effusion and tamponade, s/p window.  b.  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 SCC IN SITU TX CX3  SCC (squamous cell carcinoma) 04/12/2005  RIGHT NECK SUP. SCC IN SITU TX EXC  SCC (squamous cell carcinoma) 04/16/2013  LEFT TEMPLE SCC IN SITU TX CX3 5FU  SCC (squamous cell carcinoma) 04/16/2013  BELOW LEFT EYE SCC IN SITU TX CX3 5FU  SCC (squamous cell carcinoma) 04/16/2013  BELOW RIGHT EYE SCC IN SITU TX CX3 5FU  SCC (squamous cell carcinoma) 08/04/2014  LEFT CHEEK SCC IN SITU TX CX3 5FU  SCC (squamous cell carcinoma) 11/27/2018  LEFT TEMPLE SCC IN SITU TX WITH BX  SCC (squamous cell carcinoma)   SCC (squamous cell carcinoma)   SCC (squamous cell carcinoma)   SCC (squamous cell carcinoma) Well Diff 09/13/2016  Tip of Nose SCC WELL DIFF TX (MOH's), and RIGHT INNER EYE SUP. 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  Type 2 diabetes mellitus with vascular disease ([place]) 05/21/2007  Qualifier: Diagnosis of  By:  Diona Browner MD, [name]    Type 2 DM with CKD stage 3 and hypertension ([place]) 07/16/2015  Unspecified essential hypertension  Past Surgical History: Past Surgical History: Procedure Laterality Date  HERNIA REPAIR    LEFT HEART CATHETERIZATION WITH CORONARY ANGIOGRAM N/A 09/13/2012  Procedure: LEFT HEART CATHETERIZATION WITH CORONARY ANGIOGRAM;  Surgeon: Sherren Mocha, MD;  Location[facility] CATH LAB;  Service: Cardiovascular;  Laterality: N/A;  LOBECTOMY  2001  upper left  PERICARDIAL TAP N/A 09/16/2012  Procedure: PERICARDIAL TAP;  Surgeon: Sinclair Grooms, MD;  Location: [facility] CATH LAB;  Service: Cardiovascular;  Laterality: N/A;  PROSTATECTOMY  2003  SUBXYPHOID PERICARDIAL WINDOW N/A 09/16/2012  Procedure: SUBXYPHOID PERICARDIAL WINDOW;  Surgeon: Rexene Alberts, MD;  Location: [facility] OR;  Service: Thoracic;  Laterality: N/A;  TONSILLECTOMY   HPI: Patient is an 87 y.o. male with PMH: Alzheimer's dementia, DM-2, essential HTN, stage IIIb chronic kidney disease who was admitted to [facility] on 12/4 with acute ischemic CVA after presenting from home to [facility] ED c/o left sided weakness. CXR negative for any active processes, CT head negative for acute intracranial abnormality but MRI brain showing small foci of acute ischemia within ventral right pons and left forntal periventricular white matter; no hemorrhage or mass; diffuse, severe atrophy worst in frontal and parietal lobes.  Subjective: pleasant, alert, sitting in chair in radiology suite  Recommendations for follow up therapy are one component of a multi-disciplinary discharge planning process, led by the attending physician.  Recommendations may be updated based on patient status, additional functional criteria and insurance authorization. Assessment / Plan / Recommendation Clinical Impressions 03/08/2021 Clinical Impression Patient presents with a mild oropharyngeal dysphagia as per this MBS. He exhibited mildly delayed mastication of regular solids and reduced anterior to posterior  transit of puree solids, barium tablet and regular solids. Swallow was initiated at level of vallecular sinus with puree solids, nectar thick liquids and regular solids and initiated at level of almost at pyriform sinus with thin liquids. Silent, trace aspiration occured during the swallow with each small sip of thin liquids (PAS 8) secondary to reduced airway protection. No aspiration or penetration observed with nectar thick liquids even with straw sips and taking barium tablet with nectar thick liquid sip. No significant amount of pharyngeal residuals remained post initial swallows. Appearance of prominent cricopharyngeal bar as well as questionable cervical osteophytes ( no radiologist present to confirm) resulted in mildly reduced transit but no retention of barium. No esophageal backflow observed and esophageal sweep did not reveal anything significant. SLP is recommending Dys 3 solids, nectar thick liquids at this time. SLP Visit Diagnosis Dysphagia, unspecified (R13.10) Attention and concentration deficit following -- Frontal lobe and executive function deficit following -- Impact on safety and function Mild aspiration risk;Moderate aspiration risk   Treatment Recommendations 03/08/2021 Treatment Recommendations Therapy as outlined in treatment plan below   Prognosis 03/08/2021 Prognosis for Safe Diet Advancement Good Barriers to Reach  Goals Cognitive deficits Barriers/Prognosis Comment h/o Alzheimer's dementia Diet Recommendations 03/08/2021 SLP Diet Recommendations Dysphagia 3 (Mech soft) solids;Nectar thick liquid Liquid Administration via Cup;Straw Medication Administration Whole meds with liquid Compensations Minimize environmental distractions;Slow rate;Small sips/bites Postural Changes --   Other Recommendations 03/08/2021 Recommended Consults -- Oral Care Recommendations Oral care BID Other Recommendations Order thickener from pharmacy;Prohibited food (jello, ice cream, thin soups);Remove water  pitcher;Clarify dietary restrictions Follow Up Recommendations Acute inpatient rehab (3hours/day) Assistance recommended at discharge Frequent or constant Supervision/Assistance Functional Status Assessment Patient has had a recent decline in their functional status and demonstrates the ability to make significant improvements in function in a reasonable and predictable amount of time. Frequency and Duration  03/08/2021 Speech Therapy Frequency (ACUTE ONLY) min 2x/week Treatment Duration 1 week   Oral Phase 03/08/2021 Oral Phase Impaired Oral - Pudding Teaspoon -- Oral - Pudding Cup -- Oral - Honey Teaspoon -- Oral - Honey Cup -- Oral - Nectar Teaspoon -- Oral - Nectar Cup WFL Oral - Nectar Straw WFL Oral - Thin Teaspoon -- Oral - Thin Cup WFL Oral - Thin Straw -- Oral - Puree Delayed oral transit;Reduced posterior propulsion Oral - Mech Soft -- Oral - Regular Impaired mastication;Delayed oral transit;Reduced posterior propulsion Oral - Multi-Consistency -- Oral - Pill Delayed oral transit;Decreased bolus cohesion;Reduced posterior propulsion Oral Phase - Comment --  Pharyngeal Phase 03/08/2021 Pharyngeal Phase Impaired Pharyngeal- Pudding Teaspoon -- Pharyngeal -- Pharyngeal- Pudding Cup -- Pharyngeal -- Pharyngeal- Honey Teaspoon -- Pharyngeal -- Pharyngeal- Honey Cup -- Pharyngeal -- Pharyngeal- Nectar Teaspoon -- Pharyngeal -- Pharyngeal- Nectar Cup Delayed swallow initiation-vallecula Pharyngeal -- Pharyngeal- Nectar Straw Delayed swallow initiation-vallecula Pharyngeal -- Pharyngeal- Thin Teaspoon -- Pharyngeal -- Pharyngeal- Thin Cup Delayed swallow initiation-vallecula;Delayed swallow initiation-pyriform sinuses;Reduced airway/laryngeal closure;Penetration/Aspiration during swallow;Trace aspiration Pharyngeal Material enters airway, passes BELOW cords without attempt by patient to eject out (silent aspiration) Pharyngeal- Thin Straw -- Pharyngeal -- Pharyngeal- Puree Delayed swallow initiation-vallecula  Pharyngeal -- Pharyngeal- Mechanical Soft -- Pharyngeal -- Pharyngeal- Regular Delayed swallow initiation-vallecula Pharyngeal -- Pharyngeal- Multi-consistency -- Pharyngeal -- Pharyngeal- Pill WFL Pharyngeal -- Pharyngeal Comment --  Cervical Esophageal Phase  03/08/2021 Cervical Esophageal Phase Impaired Pudding Teaspoon -- Pudding Cup -- Honey Teaspoon -- Honey Cup -- Nectar Teaspoon -- Nectar Cup Prominent cricopharyngeal segment Nectar Straw Prominent cricopharyngeal segment Thin Teaspoon -- Thin Cup Prominent cricopharyngeal segment Thin Straw -- Puree Prominent cricopharyngeal segment Mechanical Soft -- Regular Prominent cricopharyngeal segment Multi-consistency -- Pill Prominent cricopharyngeal segment Cervical Esophageal Comment -- Sonia Baller, MA, CCC-SLP Speech Therapy                     ECHOCARDIOGRAM COMPLETE  Result Date: 03/08/2021    ECHOCARDIOGRAM REPORT   Patient Name:   [name] Date of Exam: 03/08/2021 Medical Rec #:  [id number]        Height:       69.0 in Accession #:    [id number]       Weight:       182.0 lb Date of Birth:  07-17-1930        BSA:          1.985 m Patient Age:    33 years         BP:           171/84 mmHg Patient Gender: M                HR:  72 bpm. Exam Location:  Inpatient Procedure: 2D Echo, Cardiac Doppler and Color Doppler Indications:    stroke  History:        Patient has no prior history of Echocardiogram examinations.                 Stroke; Risk Factors:Diabetes and Hypertension.  Sonographer:    Melissa Morford RDCS (AE, PE) Referring Phys: [id number] Rhetta Mura  Sonographer Comments: Technically difficult study due to poor echo windows. IMPRESSIONS  1. Left ventricular ejection fraction, by estimation, is 50 to 55%. The left ventricle has low normal function. The left ventricle has no regional wall motion abnormalities. There is moderate asymmetric left ventricular hypertrophy of the basal-septal segment. Left ventricular diastolic  parameters are consistent with Grade I diastolic dysfunction (impaired relaxation).  2. Right ventricular systolic function is mildly reduced. The right ventricular size is normal.  3. The mitral valve is normal in structure. No evidence of mitral valve regurgitation. No evidence of mitral stenosis.  4. The aortic valve is tricuspid. Aortic valve regurgitation is trivial. No aortic stenosis is present.  5. The inferior vena cava is normal in size with greater than 50% respiratory variability, suggesting right atrial pressure of 3 mmHg. FINDINGS  Left Ventricle: Left ventricular ejection fraction, by estimation, is 50 to 55%. The left ventricle has low normal function. The left ventricle has no regional wall motion abnormalities. The left ventricular internal cavity size was normal in size. There is moderate asymmetric left ventricular hypertrophy of the basal-septal segment. Left ventricular diastolic parameters are consistent with Grade I diastolic dysfunction (impaired relaxation). Right Ventricle: The right ventricular size is normal. No increase in right ventricular wall thickness. Right ventricular systolic function is mildly reduced. The tricuspid regurgitant velocity is 1.68 m/s, and with an assumed right atrial pressure of 3 mmHg, the estimated right ventricular systolic pressure is 29.9 mmHg. Left Atrium: Left atrial size was normal in size. Right Atrium: Right atrial size was normal in size. Pericardium: There is no evidence of pericardial effusion. Mitral Valve: The mitral valve is normal in structure. No evidence of mitral valve regurgitation. No evidence of mitral valve stenosis. Tricuspid Valve: The tricuspid valve is normal in structure. Tricuspid valve regurgitation is trivial. Aortic Valve: The aortic valve is tricuspid. Aortic valve regurgitation is trivial. No aortic stenosis is present. Pulmonic Valve: The pulmonic valve was grossly normal. Pulmonic valve regurgitation is trivial. Aorta: The  aortic root and ascending aorta are structurally normal, with no evidence of dilitation. Venous: The inferior vena cava is normal in size with greater than 50% respiratory variability, suggesting right atrial pressure of 3 mmHg. IAS/Shunts: The interatrial septum was not well visualized.  LEFT VENTRICLE PLAX 2D LVIDd:         4.60 cm   Diastology LVIDs:         3.40 cm   LV e' medial:    6.20 cm/s LV PW:         1.30 cm   LV E/e' medial:  5.0 LV IVS:        1.30 cm   LV e' lateral:   10.90 cm/s LVOT diam:     2.30 cm   LV E/e' lateral: 2.9 LV SV:         58 LV SV Index:   29 LVOT Area:     4.15 cm  RIGHT VENTRICLE RV S prime:     9.79 cm/s TAPSE (M-mode): 1.3 cm LEFT ATRIUM  Index        RIGHT ATRIUM           Index LA diam:        3.80 cm 1.91 cm/m   RA Area:     14.10 cm LA Vol (A2C):   46.3 ml 23.33 ml/m  RA Volume:   37.90 ml  19.09 ml/m LA Vol (A4C):   18.9 ml 9.52 ml/m LA Biplane Vol: 30.7 ml 15.47 ml/m  AORTIC VALVE LVOT Vmax:   94.80 cm/s LVOT Vmean:  61.600 cm/s LVOT VTI:    0.140 m  AORTA Ao Root diam: 3.80 cm Ao Asc diam:  3.30 cm MITRAL VALVE               TRICUSPID VALVE MV Area (PHT): 5.27 cm    TR Peak grad:   11.3 mmHg MV Decel Time: 144 msec    TR Vmax:        168.00 cm/s MV E velocity: 31.13 cm/s MV A velocity: 91.30 cm/s  SHUNTS MV E/A ratio:  0.34        Systemic VTI:  0.14 m                            Systemic Diam: 2.30 cm Oswaldo Milian MD Electronically signed by Oswaldo Milian MD Signature Date/Time: 03/08/2021/1:30:23 PM    Final    VAS US CAROTID  Result Date: 03/08/2021 Carotid Arterial Duplex Study Patient Name:  [name]  Date of Exam:   03/08/2021 Medical Rec #: [id number]         Accession #:    [id number] Date of Birth: 1930-09-06         Patient Gender: M Patient Age:   66 years Exam Location:  [facility] Procedure:      VAS US CAROTID Referring Phys: Babs Bertin  --------------------------------------------------------------------------------  Indications:       CVA. Risk Factors:      Hypertension, hyperlipidemia, Diabetes, past history of                    smoking, coronary artery disease. Other Factors:     CKD3. Comparison Study:  No previous exams Performing Technologist: Jody Hill RVT, RDMS  Examination Guidelines: A complete evaluation includes B-mode imaging, spectral Doppler, color Doppler, and power Doppler as needed of all accessible portions of each vessel. Bilateral testing is considered an integral part of a complete examination. Limited examinations for reoccurring indications may be performed as noted.  Right Carotid Findings: +----------+--------+--------+--------+------------------+------------------+             PSV cm/s EDV cm/s Stenosis Plaque Description Comments            +----------+--------+--------+--------+------------------+------------------+  CCA Prox   62       10                                   intimal thickening  +----------+--------+--------+--------+------------------+------------------+  CCA Distal 63       10                                                       +----------+--------+--------+--------+------------------+------------------+  ICA Prox   73  12                heterogenous                           +----------+--------+--------+--------+------------------+------------------+  ICA Distal 61       11                                                       +----------+--------+--------+--------+------------------+------------------+  ECA        78       0                                                        +----------+--------+--------+--------+------------------+------------------+ +----------+--------+-------+----------------+-------------------+             PSV cm/s EDV cms Describe         Arm Pressure (mmHG)  +----------+--------+-------+----------------+-------------------+  Subclavian 107               Multiphasic, WNL                      +----------+--------+-------+----------------+-------------------+ +---------+--------+--+--------+-+---------+  Vertebral PSV cm/s 44 EDV cm/s 7 Antegrade  +---------+--------+--+--------+-+---------+  Left Carotid Findings: +----------+--------+--------+--------+------------------+------------------+             PSV cm/s EDV cm/s Stenosis Plaque Description Comments            +----------+--------+--------+--------+------------------+------------------+  CCA Prox   89       13                                   intimal thickening  +----------+--------+--------+--------+------------------+------------------+  CCA Distal 68       9                                                        +----------+--------+--------+--------+------------------+------------------+  ICA Prox   70       14                calcific                               +----------+--------+--------+--------+------------------+------------------+  ICA Distal 76       16                                                       +----------+--------+--------+--------+------------------+------------------+  ECA        62       0  shadowing           +----------+--------+--------+--------+------------------+------------------+ +----------+--------+--------+----------------+-------------------+             PSV cm/s EDV cm/s Describe         Arm Pressure (mmHG)  +----------+--------+--------+----------------+-------------------+  Subclavian 96                Multiphasic, WNL                      +----------+--------+--------+----------------+-------------------+ +---------+--------+--+--------+-+---------+  Vertebral PSV cm/s 46 EDV cm/s 9 Antegrade  +---------+--------+--+--------+-+---------+   Summary: Right Carotid: The extracranial vessels were near-normal with only minimal wall                thickening or plaque. Left Carotid: The extracranial vessels were near-normal with only  minimal wall               thickening or plaque. Vertebrals:  Bilateral vertebral arteries demonstrate antegrade flow. Subclavians: Normal flow hemodynamics were seen in bilateral subclavian              arteries. *See table(s) above for measurements and observations.  Electronically signed by Antony Contras MD on 03/08/2021 at 1:10:33 PM.    Final     Labs:  Basic Metabolic Panel: Recent Labs  Lab 03/30/21 0551  NA 139  K 4.4  CL 108  CO2 26  GLUCOSE 170*  BUN 23  CREATININE 1.51*  CALCIUM 8.5*    CBC: Recent Labs  Lab 03/30/21 0551 03/31/21 0553  WBC 5.7 6.2  NEUTROABS 3.8  --   HGB 10.3* 10.1*  HCT 31.3* 31.1*  MCV 102.6* 103.3*  PLT 120* 122*    CBG: Recent Labs  Lab 03/31/21 1705 03/31/21 2118 04/01/21 0526 04/01/21 1154 04/01/21 1701  GLUCAP 92 110* 128* 139* 172*   Family history.  Father with colon cancer mother with dementia as well as diabetes.  Brother with lung cancer.  Denies any esophageal cancer or rectal cancer  Brief HPI:   [name] is a 87 y.o. 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  Per chart review lives with spouse as well as a live-in caregiver.  Presented 03/07/2021 left-sided weakness x2 to 3 days.  Cranial CT scan negative.  MRI identified small foci of acute ischemia within the ventral right pons and left frontal periventricular white matter.  No hemorrhage or mass-effect.  MRA with no intracranial large vessel occlusion.  Patient did not receive tPA.  Admission chemistries unremarkable except potassium 5.2 glucose 229 creatinine 1.50 hemoglobin A1c 8.3 urinalysis negative nitrite.  Echocardiogram with ejection fraction of 50 to 55% no wall motion abnormalities grade 1 diastolic dysfunction.  Neurology follow-up maintained on  aspirin 81 mg daily and Plavix 75 mg daily for CVA prophylaxis x3 weeks and aspirin alone.  Lovenox for DVT prophylaxis.  Dysphagia #3 nectar thick liquid diet.  Therapy evaluations completed due to patient's left-sided weakness was admitted for a comprehensive rehab program.   Hospital Course: [name] was admitted to rehab 03/12/2021 for inpatient therapies to consist of PT, ST and OT at least three hours five days a week. Past admission physiatrist, therapy team and rehab RN have worked together to provide customized collaborative inpatient rehab.  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  Noted history of dementia mood stabilization continue on Aricept as well as Zoloft.  Blood sugars overall controlled hemoglobin A1c 8.3 and his Glucotrol was resumed back at low-dose Glucophage held due to some mild renal insufficiency and could be followed up as outpatient with latest creatinine 1.51.  Patient had been on low-dose insulin therapy during his hospital stay heavily requested not to continue on discharge discussed at length monitoring blood sugars and follow-up with PCP.  His diet was slowly advanced.  Noted history of macrocytic anemia vitamin B12 deficiency monitoring of hemoglobin hematocrit as well as history of prostate cancer prostatectomy added Flomax monitoring of blood pressure.  History of adenocarcinoma left lung with resection follow-up outpatient.  Lipitor ongoing for hyperlipidemia.  Patient did have episode of epistaxis Lovenox discontinued follow-up CBC stable his Plavix had been discontinued 03/29/2021.  A nasal Rhino Rocket had been inserted to help with nosebleed.  Mild thrombocytopenia platelets 113 110 120 monitoring for bleeding episodes.  He continued on his eyedrops for macular  degeneration.   Blood pressures were monitored on TID basis and soft and monitored  Diabetes has been monitored with ac/hs CBG checks and SSI was use prn for tighter BS control.    Rehab course: During patient's stay in rehab weekly team conferences were held to monitor patient's progress, set goals and discuss barriers to discharge. At admission, patient required moderate assist 5 feet 2 person hand-held assist moderate assist sit to stand  Physical exam.  Blood pressure 144/66 pulse 73 temperature 97.4 respirations 20 oxygen saturation 95% room air Constitutional.  No acute distress HEENT Head.  Normocephalic and atraumatic Eyes.  Pupils round and reactive to light no discharge without nystagmus Neck.  Supple nontender no JVD without thyromegaly Cardiac regular rate rhythm any extra sounds or murmur heard Abdomen.  Soft nontender positive bowel sounds without rebound Respiratory effort normal no respiratory distress without wheeze Skin.  Warm and dry Musculoskeletal.  Normal range of motion Neurologic.  Alert makes eye contact with examiner.  Provides name age and month.  Follows basic commands.  Left central 7 and tongue deviation.  Left upper extremity 2/5 deltoid, 3/5 bicep, 2+ tricep, 1+ to 2 - wrist/fingers, left lower extremity 2+ to 3/5.  Right upper extremity right lower extremity 4-4+/5 proximal to distal.  He/She  has had improvement in activity tolerance, balance, postural control as well as ability to compensate for deficits. He/She has had improvement in functional use RUE/LUE  and RLE/LLE as well as improvement in awareness.  Sit to stand rolling walker contact-guard ambulated 150 feet rolling walker contact-guard wearing left lower extremity AFO.  Stair negotiation ascending descending with some cues contact-guard.  He is able to gather belongings for activities of daily living and homemaking.  Completed upper body dressing with set up.  Bathing from sitting seat with overall  supervision.  It was discussed with family need for supervision for safety.       Disposition: Discharge to home   Diet: Carb modified  Special Instructions: No driving smoking or alcohol  Medications at discharge 1.  Tylenol as needed 2.  Aspirin 81 mg p.o. daily 3.  Lipitor 20 mg daily 4.  Aricept 10 mg nightly 5.  Ferrous sulfate 325 mg daily 6.  Protonix 40 mg p.o. daily 7.  Glucotrol 5 mg daily 8.  MiraLAX daily 9.  Senokot S2 tablets twice daily 10.  Zoloft 100 mg p.o. daily 11.  Flomax 0.4 mg daily after supper 12.  Vitamin B12 1000 mcg p.o. daily  30-35 minutes were spent completing discharge summary and discharge planning Discharge Instructions     Ambulatory referral to Neurology   Complete by: As directed    An appointment is requested in approximately: 4 weeks right ventral pontine infarct as well as small lacunar infarct   Ambulatory referral to Physical Medicine Rehab   Complete by: As directed    Moderate complexity follow-up 1 to 2 weeks right ventral pontine infarction        Follow-up Information     Kirsteins, Luanna Salk, MD Follow up.   Specialty: Physical Medicine and Rehabilitation Why: Office to call for appointment Contact information: [place] [address] [phone]                 Signed: Cathlyn Parsons 04/02/2021, 5:16 AM

## 2021-03-31 NOTE — Progress Notes (Signed)
Pt called for Nurse around 3:30 a.m. Upon arrival this nurse found patient with a mouth full of blood. Nurse rinsed patients mouth out with small bit of water to assess oral cavity for bleeding site. Patient stated that he "woke up to feeling of something sliding down his throat". Charge Nurse was notified about bloody mouth since nurse was unable to find the source. Charge nurse Katharine Look assessed patient as well and advised this nurse to call rapid response since patient had no prior history of GI issues and only labs on file 12/27 with no other comparison. Pt mouth eventually quit bleeding after RR was called, RR was unable to assess at the time of call and was advised to contact pt MD. Pt denies any pain or discomfort, pt was left sitting up in bed, with call light in reach and cup to spit bloody sputum in for record. Will continue to monitor and notify MD of event in morning. Vitals were taken prior to event, pt is stable at this time.

## 2021-03-31 NOTE — Progress Notes (Signed)
Physical Therapy Session Note  Patient Details  Name: RITHY MANDLEY MRN: [id number] Date of Birth: [birth year]  Today's Date: 03/31/2021 PT Missed Time: 34 Minutes Missed Time Reason: Patient ill (Comment);Patient fatigue  Short Term Goals: Week 3:  PT Short Term Goal 1 (Week 3): STG= LTG based on ELOS  Skilled Therapeutic Interventions/Progress Updates:    Patient received reclined in bed, requesting to rest due to fatigue and frequent bleeding this AM. Bed alarm on, call light within reach.   Therapy Documentation Precautions:  Precautions Precautions: Fall, Other (comment) Precaution Comments: L hemi, posterior bias Restrictions Weight Bearing Restrictions: No    Therapy/Group: Individual Therapy  Karoline Caldwell, PT, DPT, CBIS  03/31/2021, 7:49 AM

## 2021-04-01 ENCOUNTER — Other Ambulatory Visit (HOSPITAL_COMMUNITY): Payer: Self-pay

## 2021-04-01 LAB — GLUCOSE, CAPILLARY
Glucose-Capillary: 128 mg/dL — ABNORMAL HIGH (ref 70–99)
Glucose-Capillary: 139 mg/dL — ABNORMAL HIGH (ref 70–99)
Glucose-Capillary: 172 mg/dL — ABNORMAL HIGH (ref 70–99)

## 2021-04-01 MED ORDER — GLIPIZIDE 5 MG PO TABS
5.0000 mg | ORAL_TABLET | Freq: Every day | ORAL | 0 refills | Status: DC
  Filled 2021-04-01: qty 30, 30d supply, fill #0

## 2021-04-01 MED ORDER — POLYETHYLENE GLYCOL 3350 17 G PO PACK: 17.0000 g | PACK | Freq: Every day | ORAL | 0 refills | Status: DC

## 2021-04-01 MED ORDER — TAMSULOSIN HCL 0.4 MG PO CAPS
0.4000 mg | ORAL_CAPSULE | Freq: Every day | ORAL | 0 refills | Status: DC
  Filled 2021-04-01: qty 30, 30d supply, fill #0

## 2021-04-01 MED ORDER — ACETAMINOPHEN 325 MG PO TABS: 650.0000 mg | ORAL_TABLET | Freq: Four times a day (QID) | ORAL | Status: DC | PRN

## 2021-04-01 MED ORDER — FERROUS SULFATE 325 (65 FE) MG PO TABS
325.0000 mg | ORAL_TABLET | Freq: Every day | ORAL | 0 refills | Status: DC
  Filled 2021-04-01: qty 30, 30d supply, fill #0

## 2021-04-01 MED ORDER — SERTRALINE HCL 100 MG PO TABS
100.0000 mg | ORAL_TABLET | Freq: Every day | ORAL | 0 refills | Status: DC
  Filled 2021-04-01: qty 30, 30d supply, fill #0

## 2021-04-01 MED ORDER — ATORVASTATIN CALCIUM 20 MG PO TABS
20.0000 mg | ORAL_TABLET | Freq: Every day | ORAL | 0 refills | Status: DC
  Filled 2021-04-01: qty 30, 30d supply, fill #0

## 2021-04-01 MED ORDER — CYANOCOBALAMIN 1000 MCG PO TABS
1000.0000 ug | ORAL_TABLET | Freq: Every day | ORAL | 0 refills | Status: DC
  Filled 2021-04-01: qty 30, 30d supply, fill #0

## 2021-04-01 MED ORDER — PANTOPRAZOLE SODIUM 40 MG PO TBEC
40.0000 mg | DELAYED_RELEASE_TABLET | Freq: Every day | ORAL | 0 refills | Status: DC
  Filled 2021-04-01: qty 30, 30d supply, fill #0

## 2021-04-01 MED ORDER — FISH OIL 1000 MG PO CAPS
1200.0000 mg | ORAL_CAPSULE | Freq: Two times a day (BID) | ORAL | 0 refills | Status: DC
  Filled 2021-04-01: qty 30, 15d supply, fill #0

## 2021-04-01 MED ORDER — SENNOSIDES-DOCUSATE SODIUM 8.6-50 MG PO TABS: 2.0000 | ORAL_TABLET | Freq: Two times a day (BID) | ORAL | Status: DC

## 2021-04-01 NOTE — Progress Notes (Addendum)
Occupational Therapy Discharge Summary  Patient Details  Name: LEONDRE TAUL MRN: [id number] Date of Birth: 06/11/1930  Today's Date: 04/01/2021 OT Individual Time: 1157-2620 OT Individual Time Calculation (min): 68 min   Session Note:  Pt worked on Dance movement psychotherapist with his daughter and his spouse present.  His daughter Tammy participated on hands on treatment as his spouse needs use of a rollator as well as her own caregiver.  Pt was able to removed shoes, AFO, and socks with supervision and then donned gripper socks for ambulation to the shower bench.  He was able to then use the RW for support to ambulate to the shower where he removed the rest of his clothing sit to stand with min guard assist.  [employer] was utilized for bathing with therapist assisting with placement on the left hand and then pt completing 75% of bathing task using the LUE.  He did need min facilitation to wash the right shoulder and underarm.  He was able to stand with min guard assist using the rail for support when washing the front and back peri area.  Pt's daughter assisted with ambulation out to the wheelchair with min guard assist using the RW for support.  He was able to then complete dressing tasks at mod assist for donning a pullover shirt and min assist for donning brief and pants sit to stand.  Therapist had assisted with gripper socks secondary to decreased time prior to pt coming out of the shower.  He was able to complete transfer to the bed at min guard with the RW and his daughter assisting.  Supervision for supine to sit.  Provided handouts to daughter for coordination tasks and LUE AROM exercises to be completed daily.  Pt is familiar with most of these exercises, but did not review them with his specifically during this session.  Pt's daughter voices understanding and can assist at home along with follow-up OT services via outpatient.  Patient has met 7 of 11 long term goals due to improved activity  tolerance, improved balance, postural control, ability to compensate for deficits, functional use of  LEFT upper extremity, improved awareness, and improved coordination.  Patient to discharge at [facility] Assist level.  Patient's care partner is independent to provide the necessary physical and cognitive assistance at discharge.    Reasons goals not met: He continues to need min guard assist for transfers as well as mod assist for UB dressing and min to mod for LB dressing.  Recommendation:  Patient will benefit from ongoing skilled OT services in outpatient setting to continue to advance functional skills in the area of BADL, iADL, and Reduce care partner burden.  Pt will continue to benefit from outpatient OT to address current min to mod assist level for selfcare tasks as well as min guard level for transfers with the RW.  Also feel he will benefit from continued neuromuscular re-education to help increase independence with LUE functional use so that he can get back to at least a non-dominant level with selfcare tasks.    Equipment: No equipment provided  Reasons for discharge: treatment goals met and discharge from hospital  Patient/family agrees with progress made and goals achieved: Yes  OT Discharge Precautions/Restrictions  Precautions Precautions: Fall;Other (comment) Precaution Comments: L hemi, posterior bias Restrictions Weight Bearing Restrictions: No  Pain Pain Assessment Pain Scale: Faces Pain Score: 0-No pain ADL ADL Eating: Modified independent Where Assessed-Eating: Wheelchair Grooming: Modified independent Where Assessed-Grooming: Wheelchair Upper Body Bathing: Minimal  assistance Where Assessed-Upper Body Bathing: Chair, Shower Lower Body Bathing: Contact guard Where Assessed-Lower Body Bathing: Shower, Chair Upper Body Dressing: Moderate assistance Where Assessed-Upper Body Dressing: Wheelchair Lower Body Dressing: Moderate assistance Where Assessed-Lower  Body Dressing: Wheelchair, Sitting at sink, Standing at sink Toileting: Minimal assistance Where Assessed-Toileting: Bedside Commode Toilet Transfer: Therapist, music Method: Counselling psychologist: Radiographer, therapeutic: Not assessed Social research officer, government: Curator Method: Heritage manager: Civil engineer, contracting with back Vision Baseline Vision/History: 6 Macular Degeneration;1 Wears glasses Eye Alignment: Within Functional Limits Ocular Range of Motion: Within Functional Limits Alignment/Gaze Preference: Within Defined Limits Visual Fields: No apparent deficits Additional Comments: No significant visual deficits noted in treatment over his stay with functional tasks with pt wearing his bifocals most of the time. Therapist was not able to formally assess on discharge. Perception  Perception: Within Functional Limits Praxis Praxis: Intact Cognition Overall Cognitive Status: History of cognitive impairments - at baseline Arousal/Alertness: Awake/alert Orientation Level: Oriented X4 Year: 2022 Month: December Day of Week: Correct Attention: Focused;Sustained Focused Attention: Appears intact Sustained Attention: Appears intact Memory: Impaired Memory Impairment: Decreased recall of new information Immediate Memory Recall: Sock;Blue;Bed Memory Recall Sock: Without Cue Memory Recall Blue: Without Cue Memory Recall Bed: Without Cue Awareness: Appears intact Problem Solving: Appears intact Safety/Judgment: Appears intact Sensation Sensation Light Touch: Appears Intact Hot/Cold: Appears Intact Proprioception: Appears Intact Stereognosis: Not tested Coordination Gross Motor Movements are Fluid and Coordinated: No Fine Motor Movements are Fluid and Coordinated: No Coordination and Movement Description: Pt currently Brunnstrum stage IV-V in the left hand and arm.  He uses the UE functionally at  gross assist level with supervision and a diminshed level with mod assist. Motor  Motor Motor: Hemiplegia;Abnormal postural alignment and control Motor - Discharge Observations: Pt still with left hemiparesis Mobility  Bed Mobility Bed Mobility: Supine to Sit;Sit to Supine Supine to Sit: Supervision/Verbal cueing Sit to Supine: Supervision/Verbal cueing Transfers Sit to Stand: Contact Guard/Touching assist  Trunk/Postural Assessment  Cervical Assessment Cervical Assessment: Exceptions to [facility] (forward cervical protraction) Thoracic Assessment Thoracic Assessment: Exceptions to [facility] (slight thoracic kyphosist) Lumbar Assessment Lumbar Assessment: Exceptions to [facility] (lumbar flexion with posterior pelvic tilt in sitting.)  Balance Balance Balance Assessed: Yes Static Sitting Balance Static Sitting - Balance Support: Feet supported Static Sitting - Level of Assistance: 7: Independent Dynamic Sitting Balance Dynamic Sitting - Balance Support: Feet supported Dynamic Sitting - Level of Assistance: 5: Stand by assistance Static Standing Balance Static Standing - Balance Support: During functional activity;Bilateral upper extremity supported Static Standing - Level of Assistance: 5: Stand by assistance Dynamic Standing Balance Dynamic Standing - Balance Support: During functional activity Dynamic Standing - Level of Assistance: Other (comment) (contact guard) Extremity/Trunk Assessment RUE Assessment RUE Assessment: Within Functional Limits LUE Assessment LUE Assessment: Exceptions to [facility] Passive Range of Motion (PROM) Comments: WFLS for all joints Active Range of Motion (AROM) Comments: Brunnstrum stage IV-V level currently with slight synergy pattern still present with shoulder flexion as well as with elbow flexion/extension. General Strength Comments: Uses functionally at a gross assist level currently with mod assist needed for integration at a diminshed level.  He demonstrates gross  digit flexion with 50% extension with the biggest impairments noted in the 4th and 5th digits. LUE Body System: Neuro Brunstrum levels for arm and hand: Arm;Hand Brunstrum level for arm: Stage IV Movement is deviating from synergy Brunstrum level for hand: Stage IV Movements deviating from synergies  Zaydan Papesh,Jameek OTR/L 04/01/2021, 5:35 PM

## 2021-04-01 NOTE — Progress Notes (Signed)
Inpatient Rehabilitation Discharge Medication Review by a Pharmacist  A complete drug regimen review was completed for this patient to identify any potential clinically significant medication issues.  High Risk Drug Classes Is patient taking? Indication by Medication  Antipsychotic No   Anticoagulant No   Antibiotic No   Opioid No   Antiplatelet Yes ASA- CVA  Hypoglycemics/insulin Yes Glipizide- T2DM  Vasoactive Medication No   Chemotherapy No   Other Yes Zoloft - depression B12/ferrous sulfate - anemia Protonix - GERD Aricept - dementia Atorvastatin - HLD/CVA     Type of Medication Issue Identified Description of Issue Recommendation(s)  Drug Interaction(s) (clinically significant)     Duplicate Therapy     Allergy     No Medication Administration End Date     Incorrect Dose     Additional Drug Therapy Needed     Significant med changes from prior encounter (inform family/care partners about these prior to discharge).    Other       Clinically significant medication issues were identified that warrant physician communication and completion of prescribed/recommended actions by midnight of the next day:  No   Time spent performing this drug regimen review (minutes):  15   Callista Hoh BS, PharmD, BCPS Clinical Pharmacist 04/01/2021 11:50 AM

## 2021-04-01 NOTE — Progress Notes (Signed)
Physical Therapy Session Note  Patient Details  Name: [name] MRN: [id number] Date of Birth: Feb 07, 1931  Today's Date: 04/01/2021 PT Individual Time: 1302-1357 PT Individual Time Calculation (min): 55 min   Short Term Goals:  Week 3:  PT Short Term Goal 1 (Week 3): STG= LTG based on ELOS   Skilled Therapeutic Interventions/Progress Updates:   Pt received supine in bed and agreeable to PT. Supine>sit transfer with supervision assist and  cues for awareness of the LLE.   Family present for education donnign shoes with assist from PT for time management. Stand pivot transfer to [facility] with supervision assist. Pt transported to rehab gym in [facility]. Gait training 2 x 123f with CGA for safety with cues for family on save guarding technique and awareness of the LLE and AD management in turns. Gait  training over ramp to simulate access to home with CGA for safety with cues for improved step height to prevent foot drag.    Car transfer training with min assist -CGA from PT and then family with RW. Cues for sit>pivot technique to reduce fall risk and limit time in SLS.   PT instructed [place] balance program with hand out provided. Min assist from PT for safety while in standing for safety and BUE support on RW.   [facility] and all needs met.        Therapy Documentation Precautions:  Precautions Precautions: Fall, Other (comment) Precaution Comments: L hemi, posterior bias Restrictions Weight Bearing Restrictions: No    Vital Signs: Therapy Vitals Temp: 98.2 F (36.8 C) Pulse Rate: 90 Resp: 20 BP: 118/64 Oxygen Therapy SpO2: 91 % O2 Device: Room Air Pain:  denies    Therapy/Group: Individual Therapy  ALorie Phenix12/29/2022, 1:57 PM

## 2021-04-01 NOTE — Progress Notes (Signed)
PROGRESS NOTE   Subjective/Complaints:  Pt had epistaxis last night- moderate amount- however nasal rocket worked- and hasn't had any more bleeding- scared this will impact his d/c- will get out today and monitor, but hopefully, will still d/c tomorrow.    ROS:   Pt denies SOB, abd pain, CP, N/V/C/D, and vision changes   Objective:   No results found. Recent Labs    03/30/21 0551 03/31/21 0553  WBC 5.7 6.2  HGB 10.3* 10.1*  HCT 31.3* 31.1*  PLT 120* 122*    Recent Labs    03/30/21 0551  NA 139  K 4.4  CL 108  CO2 26  GLUCOSE 170*  BUN 23  CREATININE 1.51*  CALCIUM 8.5*      Intake/Output Summary (Last 24 hours) at 04/01/2021 0912 Last data filed at 04/01/2021 0520 Gross per 24 hour  Intake 300 ml  Output 400 ml  Net -100 ml        Physical Exam: Vital Signs Blood pressure 133/67, pulse 80, temperature 98 F (36.7 C), temperature source Oral, resp. rate 18, height 5\' 9"  (1.753 m), weight 86.7 kg, SpO2 97 %.    General: awake, alert, appropriate, NAD HENT: conjugate gaze; oropharynx moist; nasal rocket in nares-no signs of current bleeding  CV: regular rate; no JVD Pulmonary: CTA B/L; no W/R/R- good air movement GI: soft, NT, ND, (+)BS Psychiatric: appropriate Neurological: alert Musculoskeletal:     Full ROM, No pain with AROM or PROM in the neck, trunk, or extremities. Posture appropriate  Skin:    General: Skin is warm and dry. Ecchymoses on LUE/RUE Neurological:        Comments: alert, oriented. Follows commands. Fair insight and awareness.  Left central 7 and tongue deviation. LUE 2/5 deltoid, 3/5 bicep, 2+ tricep,   2 wrist/fingers. LLE 3- to 3/5. RUE and RLE 4 to 4+/5 prox to distal--stable. Sensory exam normal for light touch and pain in all 4 limbs. No limb ataxia or cerebellar signs. No abnormal tone appreciated.     Assessment/Plan: 1. Functional deficits which require 3+ hours  per day of interdisciplinary therapy in a comprehensive inpatient rehab setting. Physiatrist is providing close team supervision and 24 hour management of active medical problems listed below. Physiatrist and rehab team continue to assess barriers to discharge/monitor patient progress toward functional and medical goals  Care Tool:  Bathing    Body parts bathed by patient: Left lower leg, Face, Left arm, Chest, Abdomen, Right upper leg, Buttocks, Front perineal area, Left upper leg, Right lower leg, Right arm   Body parts bathed by helper: Right arm     Bathing assist Assist Level: Supervision/Verbal cueing     Upper Body Dressing/Undressing Upper body dressing   What is the patient wearing?: Pull over shirt    Upper body assist Assist Level: Set up assist    Lower Body Dressing/Undressing Lower body dressing      What is the patient wearing?: Underwear/pull up, Pants     Lower body assist Assist for lower body dressing: Minimal Assistance - Patient > 75%     Toileting Toileting    Toileting assist Assist for toileting: Contact Guard/Touching  assist     Transfers Chair/bed transfer  Transfers assist     Chair/bed transfer assist level: Contact Guard/Touching assist Chair/bed transfer assistive device: Armrests, Programmer, multimedia   Ambulation assist   Ambulation activity did not occur: Safety/medical concerns  Assist level: Contact Guard/Touching assist Assistive device: Walker-rolling Max distance: 131ft   Walk 10 feet activity   Assist  Walk 10 feet activity did not occur: Safety/medical concerns  Assist level: Contact Guard/Touching assist Assistive device: Walker-rolling   Walk 50 feet activity   Assist Walk 50 feet with 2 turns activity did not occur: Safety/medical concerns  Assist level: Contact Guard/Touching assist Assistive device: Walker-rolling    Walk 150 feet activity   Assist Walk 150 feet activity did not  occur: Safety/medical concerns  Assist level: Contact Guard/Touching assist Assistive device: Walker-rolling    Walk 10 feet on uneven surface  activity   Assist Walk 10 feet on uneven surfaces activity did not occur: Safety/medical concerns   Assist level: Contact Guard/Touching assist (ramp) Assistive device: Walker-rolling   Wheelchair     Assist Is the patient using a wheelchair?: Yes Type of Wheelchair: Manual    Wheelchair assist level: Dependent - Patient 0%      Wheelchair 50 feet with 2 turns activity    Assist        Assist Level: Dependent - Patient 0%   Wheelchair 150 feet activity     Assist      Assist Level: Dependent - Patient 0%   Blood pressure 133/67, pulse 80, temperature 98 F (36.7 C), temperature source Oral, resp. rate 18, height 5\' 9"  (1.753 m), weight 86.7 kg, SpO2 97 %.  Medical Problem List and Plan: 1.  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  Impaired mobility: d/c Lovenox given bleeding from mouth             -antiplatelet therapy: Aspirin 81 mg daily and Plavix 75 mg day x3 weeks then aspirin alone.  Plavix can be discontinued after last dose 03/29/2021  12/29- Lovenox stopped and done with Plavix- only on ASA due to epistaxis/mouth bleeding.  3. Pain Management: Tylenol as needed 4. Mood/dementia: Aricept 10 mg nightly, Zoloft 100 mg daily             -antipsychotic agents: N/A 5. Neuropsych: This patient is capable of making decisions on his own behalf. 6. Skin/Wound Care: Routine skin checks 7. Fluids/Electrolytes/Nutrition: Routine in and outs with follow-up chemistries 8.  Diabetes mellitus.  Hemoglobin A1c 8.3.  Semglee just adjusted to 10 units daily.  Check blood sugars before meals and at bedtime              -   CBG (last 3)  Recent Labs    03/31/21 1705 03/31/21 2118 04/01/21 0526  GLUCAP 92 110* 128*   Improved CBG on Semglee  25U , off Novalog Patient on metformin at home however elevated creatinine limits usage.  started low-dose NovoLog with meals 12/19 remains elevated. Increase semglee to 25 u daily (12/20) Per wife was on glipizide at home may resume if CBG up Creat too hi for metformin  12/22- CBGs looking good- con't regimen- will need  insulin teaching prior to d/c, Ordered for qshift 12/24 fair control until last 24 hours  -add glipizide 5mg  daily and observe   12/27- CBGs overall a little better- will monitor   12/29- will need education on insulin, DM, etc- BG's controlled- looking good.  9.  Dysphagia.  Dysphagia #3 nectar thick liquid diet.  Advance per speech therapy  12/14 should be ready for repeat swallow study   12/23- no straws by regular and thin diet.  10.  CKD stage IIIa.  Baseline creatinine 1.3-1.8.  12/10- last Cr 1.41 down from 1.60 2 days prior- will monitor  12/26- will recheck labs in AM   BMP Latest Ref Rng & Units 03/30/2021 03/15/2021 03/11/2021  Glucose 70 - 99 mg/dL 170(H) 213(H) 170(H)  BUN 8 - 23 mg/dL 23 25(H) 22  Creatinine 0.61 - 1.24 mg/dL 1.51(H) 1.43(H) 1.41(H)  Sodium 135 - 145 mmol/L 139 139 135  Potassium 3.5 - 5.1 mmol/L 4.4 4.0 4.4  Chloride 98 - 111 mmol/L 108 104 105  CO2 22 - 32 mmol/L 26 27 25   Calcium 8.9 - 10.3 mg/dL 8.5(L) 8.7(L) 8.7(L)   12/27- Cr 1.51 and Bun 23- overall stable- con't to monitor 11.  Macrocytic anemia/vitamin B12 deficiency.   -b12 injections x 5 days (thru 12/11)-->oral supplementation -Continue iron supplement for mild iron def anemia 12.  History of prostate cancer with prostatectomy.  Follow-up outpatient.  -12/19 added flomax for urine flow---bp can tolerate 13.  History of adenocarcinoma left lung with resection 2001.  Follow-up outpatient  12/10- pt usually wheezes per daughter- is chronic for him-  s/p lobectomy.    14.  Hyperlipidemia.  Lipitor 15.  Constipation.  Senokot S2 tabs twice daily, MiraLAX daily.  12/27- LBM this AM- doing better     16.  Mild thrombocytopenia- stable no signs of hemorrhage , monitor while on enoxaparin  CBC Latest Ref Rng & Units 03/31/2021 03/30/2021 03/15/2021  WBC 4.0 - 10.5 K/uL 6.2 5.7 5.4  Hemoglobin 13.0 - 17.0 g/dL 10.1(L) 10.3(L) 12.2(L)  Hematocrit 39.0 - 52.0 % 31.1(L) 31.3(L) 36.5(L)  Platelets 150 - 400 K/uL 122(L) 120(L) 110(L)    1/27- up to 120k- stable- con't to monitor 17.  Mild conjunctivitis- continue tobra eyedrops 1 gtt QID to OD 18. Bleeding from mouth/nares: rapid response called. Lovenox stopped. Hgb repeated and stable.   12/29- stopped- will remove nasal rocket   LOS: 20 days A FACE TO FACE EVALUATION WAS PERFORMED  Jamaira Sherk 04/01/2021, 9:12 AM

## 2021-04-01 NOTE — Progress Notes (Addendum)
Patient ID: [name], male   DOB: 1930-06-01, 87 y.o.   MRN: [id number]  Patient spouse reports that the patient has a owned wheelchair. [employer] contact information provided to pt spouse again in reference to patient DME.    Erlene Quan, BSW

## 2021-04-01 NOTE — Progress Notes (Signed)
Physical Therapy Session Note  Patient Details  Name: [name] MRN: [id number] Date of Birth: 02-18-1931  Today's Date: 04/01/2021 PT Individual Time: 0832-0929 PT Individual Time Calculation (min): 57 min   Short Term Goals: Week 3:  PT Short Term Goal 1 (Week 3): STG= LTG based on ELOS  Skilled Therapeutic Interventions/Progress Updates:     Pt received supine in bed and agrees to therapy. No complaint of pain. Supine to sit with bed features and cues for positioning at EOB. Pt performs stand pivot transfer to [facility] with RW and CGA, with cues for hand placement and anterior weight transition. WC transport to gym for time management. Pt performs BERG balance test, as detailed below, scoring 31/56, up from 24/56 on previous attempt 6 days prior.  Following seated rest break, pt performs sit to stand with RW and cues for hand placement. Pt ambulates x100' with close supervision, with cues to decrease WB through R upper extremity for energy conservation and to prevent muscular imbalance, as well as increasing step height with L leg, especially as pt fatigues. Pt asked if he can perform "another lap" and pt requests to take seated rest break due to fatigue. Following rest break, pt ambulates additional 100' with same cueing and assist. WC transport back to room. Stand step transfer back to bed with CGA. Pt left supine with alarm intact and all needs within reach.  Therapy Documentation Precautions:  Precautions Precautions: Fall, Other (comment) Precaution Comments: L hemi, posterior bias Restrictions Weight Bearing Restrictions: No Balance: Balance Balance Assessed: Yes Standardized Balance Assessment Standardized Balance Assessment: Berg Balance Test Berg Balance Test Sit to Stand: Able to stand using hands after several tries Standing Unsupported: Able to stand 2 minutes with supervision Sitting with Back Unsupported but Feet Supported on Floor or Stool: Able to sit safely and  securely 2 minutes Stand to Sit: Controls descent by using hands Transfers: Needs one person to assist Standing Unsupported with Eyes Closed: Able to stand 10 seconds with supervision Standing Ubsupported with Feet Together: Able to place feet together independently and stand for 1 minute with supervision From Standing, Reach Forward with Outstretched Arm: Can reach forward >5 cm safely (2") From Standing Position, Pick up Object from Floor: Able to pick up shoe, needs supervision From Standing Position, Turn to Look Behind Over each Shoulder: Looks behind one side only/other side shows less weight shift Turn 360 Degrees: Needs assistance while turning Standing Unsupported, Alternately Place Feet on Step/Stool: Able to complete >2 steps/needs minimal assist Standing Unsupported, One Foot in Front: Able to take small step independently and hold 30 seconds Standing on One Leg: Tries to lift leg/unable to hold 3 seconds but remains standing independently Total Score: 31 Static Sitting Balance Static Sitting - Balance Support: Feet supported Static Sitting - Level of Assistance: 7: Independent Dynamic Sitting Balance Dynamic Sitting - Balance Support: Feet supported Dynamic Sitting - Level of Assistance: 5: Stand by assistance Static Standing Balance Static Standing - Balance Support: During functional activity;Bilateral upper extremity supported Static Standing - Level of Assistance: 5: Stand by assistance Dynamic Standing Balance Dynamic Standing - Balance Support: During functional activity;Bilateral upper extremity supported Dynamic Standing - Level of Assistance: 5: Stand by assistance  Therapy/Group: Individual Therapy  Breck Coons, PT, DPT 04/01/2021, 4:14 PM

## 2021-04-02 ENCOUNTER — Other Ambulatory Visit (HOSPITAL_COMMUNITY): Payer: Self-pay

## 2021-04-02 LAB — GLUCOSE, CAPILLARY: Glucose-Capillary: 143 mg/dL — ABNORMAL HIGH (ref 70–99)

## 2021-04-02 NOTE — Plan of Care (Signed)
Problem: RH Balance °Goal: LTG Patient will maintain dynamic sitting balance (PT) °Description: LTG:  Patient will maintain dynamic sitting balance with assistance during mobility activities (PT) °Outcome: Completed/Met °Goal: LTG Patient will maintain dynamic standing balance (PT) °Description: LTG:  Patient will maintain dynamic standing balance with assistance during mobility activities (PT) °Outcome: Completed/Met °  °Problem: Sit to Stand °Goal: LTG:  Patient will perform sit to stand with assistance level (PT) °Description: LTG:  Patient will perform sit to stand with assistance level (PT) °Outcome: Completed/Met °  °Problem: RH Bed Mobility °Goal: LTG Patient will perform bed mobility with assist (PT) °Description: LTG: Patient will perform bed mobility with assistance, with/without cues (PT). °Outcome: Completed/Met °  °Problem: RH Bed to Chair Transfers °Goal: LTG Patient will perform bed/chair transfers w/assist (PT) °Description: LTG: Patient will perform bed to chair transfers with assistance (PT). °Outcome: Completed/Met °  °Problem: RH Car Transfers °Goal: LTG Patient will perform car transfers with assist (PT) °Description: LTG: Patient will perform car transfers with assistance (PT). °Outcome: Completed/Met °  °Problem: RH Ambulation °Goal: LTG Patient will ambulate in controlled environment (PT) °Description: LTG: Patient will ambulate in a controlled environment, # of feet with assistance (PT). °Outcome: Completed/Met °Goal: LTG Patient will ambulate in home environment (PT) °Description: LTG: Patient will ambulate in home environment, # of feet with assistance (PT). °Outcome: Completed/Met °  °Problem: RH Stairs °Goal: LTG Patient will ambulate up and down stairs w/assist (PT) °Description: LTG: Patient will ambulate up and down # of stairs with assistance (PT) °Outcome: Completed/Met °  °

## 2021-04-02 NOTE — Progress Notes (Signed)
Patient discharged to home, accompanied by his family.

## 2021-04-02 NOTE — Progress Notes (Signed)
PROGRESS NOTE   Subjective/Complaints:  Pt has had no more bleeding- nasal or via mouth.etc.   Also ready for d/c- spoke with PA/PCP and pt will NOT go home on insulin- due to his request and age, don't want to confuse pt.   Will send home on oral agents and get f/u with PCP ASAP to titrate DM meds.   ROS:   Pt denies SOB, abd pain, CP, N/V/C/D, and vision changes    Objective:   No results found. Recent Labs    03/31/21 0553  WBC 6.2  HGB 10.1*  HCT 31.1*  PLT 122*    No results for input(s): NA, K, CL, CO2, GLUCOSE, BUN, CREATININE, CALCIUM in the last 72 hours.     Intake/Output Summary (Last 24 hours) at 04/02/2021 0819 Last data filed at 04/01/2021 2113 Gross per 24 hour  Intake 180 ml  Output 150 ml  Net 30 ml        Physical Exam: Vital Signs Blood pressure (!) 125/58, pulse 79, temperature 98.1 F (36.7 C), temperature source Oral, resp. rate 14, height 5\' 9"  (1.753 m), weight 82.6 kg, SpO2 99 %.    General: awake, alert, appropriate, sitting up in bed; nasal rocket out; NAD HENT: conjugate gaze; oropharynx moist CV: regular rate; no JVD Pulmonary: CTA B/L; no W/R/R- good air movement GI: soft, NT, ND, (+)BS Psychiatric: appropriate; interactive Neurological: alert Musculoskeletal:     Full ROM, No pain with AROM or PROM in the neck, trunk, or extremities. Posture appropriate  Skin:    General: Skin is warm and dry. Ecchymoses on LUE/RUE Neurological:        Comments: alert, oriented. Follows commands. Fair insight and awareness.  Left central 7 and tongue deviation. LUE 2/5 deltoid, 3/5 bicep, 2+ tricep,   2 wrist/fingers. LLE 3- to 3/5. RUE and RLE 4 to 4+/5 prox to distal--stable. Sensory exam normal for light touch and pain in all 4 limbs. No limb ataxia or cerebellar signs. No abnormal tone appreciated.     Assessment/Plan: 1. Functional deficits which require 3+ hours per day of  interdisciplinary therapy in a comprehensive inpatient rehab setting. Physiatrist is providing close team supervision and 24 hour management of active medical problems listed below. Physiatrist and rehab team continue to assess barriers to discharge/monitor patient progress toward functional and medical goals  Care Tool:  Bathing    Body parts bathed by patient: Left lower leg, Face, Left arm, Chest, Abdomen, Right upper leg, Buttocks, Front perineal area, Left upper leg, Right lower leg, Right arm   Body parts bathed by helper: Right arm     Bathing assist Assist Level: Minimal Assistance - Patient > 75%     Upper Body Dressing/Undressing Upper body dressing   What is the patient wearing?: Pull over shirt    Upper body assist Assist Level: Moderate Assistance - Patient 50 - 74%    Lower Body Dressing/Undressing Lower body dressing      What is the patient wearing?: Underwear/pull up, Pants     Lower body assist Assist for lower body dressing: Minimal Assistance - Patient > 75%     Toileting Toileting  Toileting assist Assist for toileting: Contact Guard/Touching assist     Transfers Chair/bed transfer  Transfers assist     Chair/bed transfer assist level: Supervision/Verbal cueing Chair/bed transfer assistive device: Programmer, multimedia   Ambulation assist   Ambulation activity did not occur: Safety/medical concerns  Assist level: Contact Guard/Touching assist Assistive device: Walker-rolling Max distance: 150   Walk 10 feet activity   Assist  Walk 10 feet activity did not occur: Safety/medical concerns  Assist level: Contact Guard/Touching assist Assistive device: Walker-rolling   Walk 50 feet activity   Assist Walk 50 feet with 2 turns activity did not occur: Safety/medical concerns  Assist level: Contact Guard/Touching assist Assistive device: Walker-rolling    Walk 150 feet activity   Assist Walk 150 feet activity did  not occur: Safety/medical concerns  Assist level: Contact Guard/Touching assist Assistive device: Walker-rolling    Walk 10 feet on uneven surface  activity   Assist Walk 10 feet on uneven surfaces activity did not occur: Safety/medical concerns   Assist level: Contact Guard/Touching assist Assistive device: Walker-rolling   Wheelchair     Assist Is the patient using a wheelchair?: No Type of Wheelchair: Manual    Wheelchair assist level: Minimal Assistance - Patient > 75% Max wheelchair distance: 150    Wheelchair 50 feet with 2 turns activity    Assist        Assist Level: Minimal Assistance - Patient > 75%   Wheelchair 150 feet activity     Assist      Assist Level: Minimal Assistance - Patient > 75%   Blood pressure (!) 125/58, pulse 79, temperature 98.1 F (36.7 C), temperature source Oral, resp. rate 14, height 5\' 9"  (1.753 m), weight 82.6 kg, SpO2 99 %.  Medical Problem List and Plan: 1.  Left-sided weakness functional deficits secondary to right ventral pontine and small left periventricular lacunar infarcts             -patient may shower             -ELOS/Goals: 12/30, CGA goals,   D/c today- will NOT go home on insulin per pt request 2.  Impaired mobility: d/c Lovenox given bleeding from mouth             -antiplatelet therapy: Aspirin 81 mg daily and Plavix 75 mg day x3 weeks then aspirin alone.  Plavix can be discontinued after last dose 03/29/2021  12/29- Lovenox stopped and done with Plavix- only on ASA due to epistaxis/mouth bleeding.  3. Pain Management: Tylenol as needed 4. Mood/dementia: Aricept 10 mg nightly, Zoloft 100 mg daily             -antipsychotic agents: N/A 5. Neuropsych: This patient is capable of making decisions on his own behalf. 6. Skin/Wound Care: Routine skin checks 7. Fluids/Electrolytes/Nutrition: Routine in and outs with follow-up chemistries 8.  Diabetes mellitus.  Hemoglobin A1c 8.3.  Semglee just adjusted  to 10 units daily.  Check blood sugars before meals and at bedtime             -   CBG (last 3)  Recent Labs    04/01/21 1154 04/01/21 1701 04/02/21 0615  GLUCAP 139* 172* 143*   Improved CBG on Semglee  25U , off Novalog Patient on metformin at home however elevated creatinine limits usage.  started low-dose NovoLog with meals 12/19 remains elevated. Increase semglee to 25 u daily (12/20) Per wife was on glipizide at home may resume if  CBG up Creat too hi for metformin  12/22- CBGs looking good- con't regimen- will need insulin teaching prior to d/c, Ordered for qshift 12/24 fair control until last 24 hours  -add glipizide 5mg  daily and observe   12/30- will go home on oral meds- per PCP and PA- and will f/u with PCP ASAP to titrate meds.  9.  Dysphagia.  Dysphagia #3 nectar thick liquid diet.  Advance per speech therapy  12/14 should be ready for repeat swallow study   12/23- no straws by regular and thin diet.  10.  CKD stage IIIa.  Baseline creatinine 1.3-1.8.  12/10- last Cr 1.41 down from 1.60 2 days prior- will monitor  12/26- will recheck labs in AM   BMP Latest Ref Rng & Units 03/30/2021 03/15/2021 03/11/2021  Glucose 70 - 99 mg/dL 170(H) 213(H) 170(H)  BUN 8 - 23 mg/dL 23 25(H) 22  Creatinine 0.61 - 1.24 mg/dL 1.51(H) 1.43(H) 1.41(H)  Sodium 135 - 145 mmol/L 139 139 135  Potassium 3.5 - 5.1 mmol/L 4.4 4.0 4.4  Chloride 98 - 111 mmol/L 108 104 105  CO2 22 - 32 mmol/L 26 27 25   Calcium 8.9 - 10.3 mg/dL 8.5(L) 8.7(L) 8.7(L)   12/27- Cr 1.51 and Bun 23- overall stable- con't to monitor 11.  Macrocytic anemia/vitamin B12 deficiency.   -b12 injections x 5 days (thru 12/11)-->oral supplementation -Continue iron supplement for mild iron def anemia 12.  History of prostate cancer with prostatectomy.  Follow-up outpatient.  -12/19 added flomax for urine flow---bp can tolerate 13.  History of adenocarcinoma left lung with resection 2001.  Follow-up outpatient  12/10- pt usually  wheezes per daughter- is chronic for him- s/p lobectomy.    14.  Hyperlipidemia.  Lipitor 15.  Constipation.  Senokot S2 tabs twice daily, MiraLAX daily.  12/27- LBM this AM- doing better     16.  Mild thrombocytopenia- stable no signs of hemorrhage , monitor while on enoxaparin  CBC Latest Ref Rng & Units 03/31/2021 03/30/2021 03/15/2021  WBC 4.0 - 10.5 K/uL 6.2 5.7 5.4  Hemoglobin 13.0 - 17.0 g/dL 10.1(L) 10.3(L) 12.2(L)  Hematocrit 39.0 - 52.0 % 31.1(L) 31.3(L) 36.5(L)  Platelets 150 - 400 K/uL 122(L) 120(L) 110(L)    1/27- up to 120k- stable- con't to monitor 17.  Mild conjunctivitis- continue tobra eyedrops 1 gtt QID to OD 18. Bleeding from mouth/nares: rapid response called. Lovenox stopped. Hgb repeated and stable.   12/29- stopped- will remove nasal rocket  12/30- no more bleeding.    LOS: 21 days A FACE TO FACE EVALUATION WAS PERFORMED  Tychelle Purkey 04/02/2021, 8:19 AM

## 2021-04-12 ENCOUNTER — Encounter: Payer: Self-pay | Admitting: Family Medicine

## 2021-04-12 DIAGNOSIS — E1159 Type 2 diabetes mellitus with other circulatory complications: Secondary | ICD-10-CM

## 2021-04-13 ENCOUNTER — Encounter: Payer: Medicare HMO | Attending: Registered Nurse | Admitting: Registered Nurse

## 2021-04-13 ENCOUNTER — Encounter: Payer: Self-pay | Admitting: Registered Nurse

## 2021-04-13 ENCOUNTER — Other Ambulatory Visit: Payer: Self-pay

## 2021-04-13 VITALS — BP 153/65 | HR 66 | Temp 97.9°F | Ht 69.0 in

## 2021-04-13 DIAGNOSIS — I6350 Cerebral infarction due to unspecified occlusion or stenosis of unspecified cerebral artery: Secondary | ICD-10-CM | POA: Diagnosis not present

## 2021-04-13 DIAGNOSIS — E119 Type 2 diabetes mellitus without complications: Secondary | ICD-10-CM | POA: Diagnosis not present

## 2021-04-13 NOTE — Progress Notes (Signed)
Subjective:    Patient ID: [name], male    DOB: [birth year], 88 y.o.   MRN: [id number]  HPI: [name] is a 88 y.o. male who is here for [facility] appointment for follow up of his Right Pontine Cerebrovascular Accident and Type 2 DM,non insulin dependent. He presented to [facility] on 03/07/2021, for left sided weakness.   Dr. Eugenia Pancoast H&P: 03/07/2021 Chief Complaint: Left-sided weakness   HPI: [name] is a 88 y.o. male with medical history significant for Alzheimer dementia, type 2 diabetes mellitus, essential hypertension, stage IIIb chronic kidney disease with baseline creatinine 1.5-1.9,  who is admitted to [facility] on 03/07/2021 with acute ischemic CVA after presenting from home to [facility] ED complaining of left-sided weakness.    History provided by the patient, patient's family, discussions with the EDP, and via chart review.  Neurology was consulted.  CT Head: WO Contrast:  IMPRESSION: No acute intracranial pathology.  MR Brain: WO Contrast:  IMPRESSION: 1. Small foci of acute ischemia within the ventral right pons and left frontal periventricular white matter. No hemorrhage or mass effect. 2. Diffuse, severe atrophy, worst in the frontal and parietal lobes. 3. Bilateral subdural hygromas.  MR Angio:  IMPRESSION: No intracranial large vessel occlusion.   Both vertebral arteries are patent to the basilar. No basilar stenosis. Severe stenosis of the right posterior cerebral artery 1.5 cm beyond its origin, but patent beyond that. Left PCA receives most of it supply from the anterior circulation but does have a small contribution from the basilar tip.   No significant anterior circulation finding.  [name] was maintained on aspirin 81 mg daily and Plavix 75 mg daily x 3 weeks for CVA Prophylaxis then aspirin alone.   [name] was admitted to inpatient Rehabilitation on 03/12/2021 and discharged home on 04/02/2021. He has a  scheduled appointment with outpatient therapy at [facility]habilitation. He denies any pain. He rates his pain 0. Also states his appetite is fair.   [name] daughter states Mr. Cambell blood sugars have been running high, she sent a My-Chart message to his PCP on 04/12/2021. [name] responded to her message. She will call Dr Lorelei Pont office today to schedule a HFU appointment.   [name] requested not to continue the low dose insulin he was receiving while hospitalized. Daughter and son will speak with Dr Lorelei Pont regarding the above, and stated if he needs to be on insulin they will assist.    Pain Inventory Average Pain 0 Pain Right Now 0 My pain is  No pain  LOCATION OF PAIN   No pain, Weakness in left arm, left hand & left leg  BOWEL Number of stools per week: 3 Oral laxative use Yes  Type of laxative Miralax Enema or suppository use No  History of colostomy No  Incontinent No   BLADDER Normal and Pads In and out cath, frequency N/A Able to self cath No  Bladder incontinence Yes  Frequent urination Yes  Leakage with coughing Yes  Difficulty starting stream No  Incomplete bladder emptying No    Mobility walk with assistance use a walker how many minutes can you walk? uknown ability to climb steps?  no do you drive?  no use a wheelchair needs help with transfers Do you have any goals in this area?  yes  Function retired  Neuro/Psych bladder control problems weakness tremor trouble walking dizziness  Prior Studies Any changes since last visit?  no  Physicians involved in your care Any changes since last visit?  no   Family History  Problem Relation Age of Onset   Cancer Father        colon   Dementia Mother    Diabetes Mother    Cancer Brother        lung   Social History   Socioeconomic History   Marital status: Married    Spouse name: Not on file   Number of children: Not on file   Years of education: Not on file   Highest  education level: Not on file  Occupational History   Occupation: retired  Tobacco Use   Smoking status: Former    Packs/day: 2.00    Years: 42.00    Pack years: 84.00    Types: Cigarettes    Start date: 02/25/1953    Quit date: 04/04/1984    Years since quitting: 37.0   Smokeless tobacco: Former    Types: Chew    Quit date: 07/27/1986   Tobacco comments:    quit tobacco 26 years ago  Vaping Use   Vaping Use: Never used  Substance and Sexual Activity   Alcohol use: No    Alcohol/week: 0.0 standard drinks   Drug use: No   Sexual activity: Not Currently  Other Topics Concern   Not on file  Social History Narrative   Lives in [place], [place]   Regular exercise--no, mowing grass   Diet: fruit and veggies   Social Determinants of Health   Financial Resource Strain: Low Risk    Difficulty of Paying Living Expenses: Not very hard  Food Insecurity: Not on file  Transportation Needs: Not on file  Physical Activity: Not on file  Stress: Not on file  Social Connections: Not on file   Past Surgical History:  Procedure Laterality Date   [place] N/A 09/13/2012   Procedure: [place];  Surgeon: Sherren Mocha, MD;  Location: [facility] CATH LAB;  Service: Cardiovascular;  Laterality: N/A;   LOBECTOMY  2001   upper left   PERICARDIAL TAP N/A 09/16/2012   Procedure: PERICARDIAL TAP;  Surgeon: Sinclair Grooms, MD;  Location: [facility] CATH LAB;  Service: Cardiovascular;  Laterality: N/A;   PROSTATECTOMY  2003   SUBXYPHOID PERICARDIAL WINDOW N/A 09/16/2012   Procedure: SUBXYPHOID PERICARDIAL WINDOW;  Surgeon: Rexene Alberts, MD;  Location: [facility];  Service: Thoracic;  Laterality: N/A;   TONSILLECTOMY     Past Medical History:  Diagnosis Date   Adenocarcinoma of left lung, stage 1 ([place]) 2001   T1N0 stage I  adenoca left lung resected 01/03/00    Alzheimer's disease ([place])    CAD (coronary artery  disease) 2014   a. 09/2012 Cath: LM nl, LAD 50-60p, D1 60-70 m, D1 50ost, LCX nl, RCA min irregs, EF 55-65%.   Generalized anxiety disorder 01/08/2007   Qualifier: Diagnosis of  By: Diona Browner MD, [name]     History of colonic polyps 07/25/2011   Macular degeneration of both eyes 01/21/2014   Nonmelanoma skin cancer 07/25/2011   Multiple lesions excised face/nose   Pericarditis    a. 09/2012 with effusion and tamponade, s/p window.  b.  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 SCC IN SITU TX CX3   SCC (squamous cell carcinoma) 04/12/2005   RIGHT NECK SUP. SCC IN SITU TX EXC   SCC (squamous cell carcinoma) 04/16/2013   LEFT TEMPLE SCC IN SITU TX CX3 5FU   SCC (squamous cell carcinoma) 04/16/2013   BELOW LEFT EYE SCC IN SITU TX CX3 5FU   SCC (squamous cell carcinoma) 04/16/2013   BELOW RIGHT EYE SCC IN SITU TX CX3 5FU   SCC (squamous cell carcinoma) 08/04/2014   LEFT CHEEK SCC IN SITU TX CX3 5FU   SCC (squamous cell carcinoma) 11/27/2018   LEFT TEMPLE SCC IN SITU TX WITH BX   SCC (squamous cell carcinoma)    SCC (squamous cell carcinoma)    SCC (squamous cell carcinoma)    SCC (squamous cell carcinoma) Well Diff 09/13/2016   Tip of Nose SCC WELL DIFF TX (MOH's), and RIGHT INNER EYE SUP. 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  Opioid Risk Score:   Fall Risk Score:  `1  Depression screen PHQ 2/9  Depression screen [facility] 2/9 12/26/2019 09/03/2018 08/30/2017 08/15/2016 01/15/2015  Decreased Interest 0 0 0 0 0  Down, Depressed, Hopeless 1 0 0 0 0  PHQ - 2 Score 1 0 0 0 0  Altered sleeping - 0 0 - -  Tired, decreased energy - 0 0 - -  Change in appetite - 0 0 - -  Feeling bad or failure about yourself  - 0 0 - -  Trouble concentrating - 0 0 - -  Moving slowly or fidgety/restless - 0 0 - -  Suicidal thoughts - 0 0 - -  PHQ-9 Score - 0 0 - -  Difficult doing work/chores - Not difficult at all Not difficult at all - -  Some recent data might be hidden    Review of Systems  Respiratory:  Positive for shortness of breath.   Genitourinary:   Positive for frequency.       Incontinence  Musculoskeletal:  Positive for gait problem.  Neurological:  Positive for dizziness, tremors and weakness.       Weakness in left arm, hand & leg  All other systems reviewed and are negative.     Objective:   Physical Exam Vitals and nursing note reviewed.  Constitutional:      Appearance: Normal appearance.  Cardiovascular:     Rate and Rhythm: Normal rate and regular rhythm.     Pulses: Normal pulses.     Heart sounds: Normal heart sounds.  Pulmonary:     Effort: Pulmonary effort is normal.     Breath sounds: Normal breath sounds.  Musculoskeletal:     Cervical back: Normal range of motion and neck supple.     Comments: Normal Muscle Bulk and Muscle Testing Reveals:  Upper Extremities: Right: Full ROM and Muscle Strength 5/5 Left Upper Extremity: Decreased ROM 90 Degrees and Muscle Strength 4/5 Lower Extremities: Right: Full ROM and Muscle Strength 5/5 Left Lower Extremity: Decreased ROM and Muscle Strength 4/5 wearing AFO Arrived in wheelchair     Skin:    General: Skin is warm and dry.  Neurological:     Mental Status: He is alert and oriented to person, place, and time.  Psychiatric:        Mood and Affect: Mood normal.        Behavior: Behavior normal.         Assessment & Plan:  Right Pontine Cerebrovascular Accident: he has a scheduled appointment with Dr Leonie Man. He has a scheduled appointment with [facility] Outpatient Therapy. Continue current medication regimen.   Type 2 DM,non insulin dependent.Continue Glipizide. Mr. Soy daughter keeping blood sugar log, she sent a My-Chart to Dr Lorelei Pont with reading she states. She will call Dr Lorelei Pont office today to schedule HFU appointment, she verbalizes understanding.   F/U with Dr Letta Pate in 4- 6 weeks

## 2021-04-14 ENCOUNTER — Telehealth: Payer: Self-pay

## 2021-04-14 ENCOUNTER — Ambulatory Visit: Payer: Medicare HMO | Attending: Physician Assistant | Admitting: Physical Therapy

## 2021-04-14 ENCOUNTER — Encounter: Payer: Self-pay | Admitting: Occupational Therapy

## 2021-04-14 ENCOUNTER — Ambulatory Visit: Payer: Medicare HMO

## 2021-04-14 ENCOUNTER — Encounter: Payer: Self-pay | Admitting: Physical Therapy

## 2021-04-14 ENCOUNTER — Ambulatory Visit: Payer: Medicare HMO | Admitting: Occupational Therapy

## 2021-04-14 DIAGNOSIS — R262 Difficulty in walking, not elsewhere classified: Secondary | ICD-10-CM | POA: Insufficient documentation

## 2021-04-14 DIAGNOSIS — R41840 Attention and concentration deficit: Secondary | ICD-10-CM | POA: Insufficient documentation

## 2021-04-14 DIAGNOSIS — R2681 Unsteadiness on feet: Secondary | ICD-10-CM | POA: Insufficient documentation

## 2021-04-14 DIAGNOSIS — R471 Dysarthria and anarthria: Secondary | ICD-10-CM | POA: Diagnosis not present

## 2021-04-14 DIAGNOSIS — R2689 Other abnormalities of gait and mobility: Secondary | ICD-10-CM | POA: Diagnosis not present

## 2021-04-14 DIAGNOSIS — M6281 Muscle weakness (generalized): Secondary | ICD-10-CM

## 2021-04-14 DIAGNOSIS — I6350 Cerebral infarction due to unspecified occlusion or stenosis of unspecified cerebral artery: Secondary | ICD-10-CM | POA: Insufficient documentation

## 2021-04-14 DIAGNOSIS — R278 Other lack of coordination: Secondary | ICD-10-CM

## 2021-04-14 DIAGNOSIS — R41842 Visuospatial deficit: Secondary | ICD-10-CM | POA: Diagnosis not present

## 2021-04-14 DIAGNOSIS — R41841 Cognitive communication deficit: Secondary | ICD-10-CM | POA: Diagnosis not present

## 2021-04-14 DIAGNOSIS — I69354 Hemiplegia and hemiparesis following cerebral infarction affecting left non-dominant side: Secondary | ICD-10-CM

## 2021-04-14 NOTE — Chronic Care Management (AMB) (Addendum)
Chronic Care Management Pharmacy Assistant   Name: [name]  MRN: [id number] DOB: 1931-03-25  Reason for Encounter: Adherence Review   Recent office visits:  04/15/21 - PCP - Patient presented for follow up after hospitalization. Labs ordered. Follow up 1 month with BG log.  Recent consult visits:  04/13/21 - Physical Medicine and Rehabilitation - Patient presented for hospital follow up after CVA. No medication changes, continue Neuro OP rehab. 02/11/21 - Dermatology - Patient presented for skin cancer spots on face. Electrodesiccation and curettage-wash area soap and water, apply vaseline to area twice daily-return in 7-10 days for suture removal.  Hospital visits:  Medication Reconciliation was completed by comparing discharge summary, patients EMR and Pharmacy list, and upon discussion with patient.  Admitted to the hospital on 03/12/21 due to CVA. Discharge date was 04/02/21. Discharged from [place]?Medications Started at [facility] Discharge:?? -started -acetaminophen (TYLENOL) atorvastatin (LIPITOR) cyanocobalamin ferrous sulfate pantoprazole (PROTONIX) polyethylene glycol (MIRALAX / GLYCOLAX) senna-docusate (Senokot-S) tamsulosin (FLOMAX)  Medication Changes at Hospital Discharge: -Changed  Fish oil                    Glipizide  Medications Discontinued at Hospital Discharge: -Stopped metformin XR 500mg                  Benadryl   Medications that remain the same after Hospital Discharge:??  -All other medications will remain the same.    Medication Reconciliation was completed by comparing discharge summary, patients EMR and Pharmacy list, and upon discussion with patient.  Admitted to the hospital on 03/07/21 due to Stroke. Discharge date was 03/12/21. Discharged from [place]?Medications Started at [facility] Discharge:?? -started Glargine   Medications Discontinued at Hospital Discharge: -Stopped Plavix after  dose on 03/29/21  Medications that remain the same after Hospital Discharge:??  -All other medications will remain the same.     Medications: Outpatient Encounter Medications as of 04/14/2021  Medication Sig   acetaminophen (TYLENOL) 325 MG tablet Take 2 tablets (650 mg total) by mouth every 6 (six) hours as needed for mild pain (or Fever >/= 101).   aspirin EC 81 MG tablet Take 81 mg by mouth daily as needed for moderate pain.   atorvastatin (LIPITOR) 20 MG tablet Take 1 tablet (20 mg total) by mouth daily at 6 PM.   cyanocobalamin 1000 MCG tablet Take 1 tablet (1,000 mcg total) by mouth daily.   donepezil (ARICEPT) 10 MG tablet TAKE 1 TABLET BY MOUTH EVERYDAY AT BEDTIME (Patient taking differently: Take 10 mg by mouth in the morning.)   ferrous sulfate 325 (65 FE) MG tablet Take 1 tablet (325 mg total) by mouth daily with breakfast.   glipiZIDE (GLUCOTROL) 5 MG tablet Take 1 tablet (5 mg total) by mouth daily before breakfast.   Multiple Vitamins-Minerals (ICAPS LUTEIN & ZEAXANTHIN PO) Take 1 capsule by mouth in the morning and at bedtime. I - Caps   Multiple Vitamins-Minerals (ICAPS) CAPS Take by mouth.   Omega-3 Fatty Acids (FISH OIL) 1000 MG CAPS Take 1 capsule (1,000 mg total) by mouth in the morning and at bedtime.   pantoprazole (PROTONIX) 40 MG tablet Take 1 tablet (40 mg total) by mouth daily.   polyethylene glycol (MIRALAX / GLYCOLAX) 17 g packet Take 17 g by mouth daily.   Propylene Glycol (SYSTANE BALANCE) 0.6 % SOLN Apply 1 drop to eye 2 (two) times daily as needed (  dry eyes).   senna-docusate (SENOKOT-S) 8.6-50 MG tablet Take 2 tablets by mouth 2 (two) times daily.   sertraline (ZOLOFT) 100 MG tablet Take 1 tablet (100 mg total) by mouth daily.   tamsulosin (FLOMAX) 0.4 MG CAPS capsule Take 1 capsule (0.4 mg total) by mouth daily after supper.   No facility-administered encounter medications on file as of 04/14/2021.      Contacted [name] on 04/22/21 for general  disease state and medication adherence call. The patient was sleeping and the wife spoke on his behalf.  Patient is not more than 5 days past due for refill on the following medications per chart history:  [facility] Medications: Medication Name/mg Last Fill Days Supply Glipizide 5mg    04/15/21 90 Atorvastatin 20mg   04/02/21 30   What concerns do you have about your medications? The wife discusses that 5 medications will need to be called in to [facility] [phone], these 5 were given at discharge from [place] and [facility] that Dr.Copland will have to give new RX's for the following medications:  Vitamin B12 1053mcg -1 tablet daily   Atorvastatin 20mg -take 1 tablet 6pm  Ferrous sulfate 325 mg -take 1 tablet breakfast   Pantoprazole 40mg  -take 1 tablet daily   Tamsulosin 0.4mg -take 1 tablet after supper  The patient denies side effects with their medications.   How often do you forget or accidentally miss a dose? Never  Do you use a pillbox? Yes  Are you having any problems getting your medications from your pharmacy? No The patient uses [facility] [phone] at this time   Has the cost of your medications been a concern? No  Since last visit with CPP, the following interventions have been made. Patient is now on Glipizide 5mg  - 04/15/21-PCP increased Glipizide 5mg  to twice daily   The patient has had an ED visit since last contact. 03/12/21 ED to [facility] from 03/12/21 thru 04/02/21 due to stroke.  The patient denies problems with their health.   Patient denies concerns or questions for [name], PharmD at this time.   Counseled patient on:  Importance of taking medication daily without missed doses, Benefits of adherence packaging or a pillbox, and Access to CCM team for any cost, medication or pharmacy concerns.   Care Gaps: Annual wellness visit in last year? No Most Recent BP reading: 124/70  72-P  04/15/21  If Diabetic: Most recent  A1C reading: 8.3%  03/08/21 Last eye exam / retinopathy screening: overdue Last diabetic foot exam: 10/01/20  Upcoming appointments: Several neurology therapy treatments upcoming   [name], CPP notified  Avel Sensor, [place] Assistant [phone]  I have reviewed the care management and care coordination activities outlined in this encounter and I am certifying that I agree with the content of this note. Sent refill requests.  [name], PharmD Clinical Pharmacist [facility] [phone]

## 2021-04-14 NOTE — Therapy (Signed)
[facility] [address] [place], [place], [address] Phone: [phone]   Fax:  [phone]  Physical Therapy Evaluation  Patient Details  Name: [name] MRN: [id number] Date of Birth: July 03, 1930 Referring Provider (PT): Cathlyn Parsons, PA-C   Encounter Date: 04/14/2021   PT End of Session - 04/14/21 1507     Visit Number 1    Number of Visits 16    Date for PT Re-Evaluation 06/09/21    Authorization Type [employer] Medicare    PT Start Time 1400    PT Stop Time 1017    PT Time Calculation (min) 45 min    Equipment Utilized During Treatment Gait belt    Activity Tolerance Patient tolerated treatment well    Behavior During Therapy [facility] for tasks assessed/performed             Past Medical History:  Diagnosis Date   Adenocarcinoma of left lung, stage 1 ([place]) 2001   T1N0 stage I  adenoca left lung resected 01/03/00    Alzheimer's disease ([place])    CAD (coronary artery disease) 2014   a. 09/2012 Cath: LM nl, LAD 50-60p, D1 60-70 m, D1 50ost, LCX nl, RCA min irregs, EF 55-65%.   Generalized anxiety disorder 01/08/2007   Qualifier: Diagnosis of  By: Diona Browner MD, [name]     History of colonic polyps 07/25/2011   Macular degeneration of both eyes 01/21/2014   Nonmelanoma skin cancer 07/25/2011   Multiple lesions excised face/nose   Pericarditis    a. 09/2012 with effusion and tamponade, s/p window.  b.  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 SCC IN SITU TX CX3   SCC (squamous cell carcinoma) 04/12/2005   RIGHT NECK SUP. SCC IN SITU TX EXC   SCC (squamous cell carcinoma) 04/16/2013   LEFT TEMPLE SCC IN SITU TX CX3 5FU   SCC (squamous cell carcinoma) 04/16/2013   BELOW LEFT EYE SCC IN SITU TX CX3 5FU   SCC (squamous cell carcinoma) 04/16/2013   BELOW RIGHT EYE SCC IN SITU TX CX3 5FU   SCC (squamous cell carcinoma) 08/04/2014   LEFT CHEEK SCC IN SITU TX CX3 5FU   SCC (squamous cell carcinoma) 11/27/2018   LEFT TEMPLE SCC IN SITU TX WITH BX   SCC (squamous cell carcinoma)    SCC (squamous cell carcinoma)    SCC (squamous cell carcinoma)    SCC (squamous cell carcinoma) Well Diff 09/13/2016   Tip of Nose SCC WELL DIFF TX (MOH's), and RIGHT INNER EYE SUP. SCC IN SITU TX TO WATCH   SCC (squamous cell carcinoma) Well Diff 05/30/2017   Right Cheekbone (Cx3,5FU) and Under Left Eye (Cx3,5FU)   Squamous cell carcinoma  in situ (SCCIS) 04/12/2005   Right Neck Inf (Cx3), Right Neck Sup (Exc), and Bulb of Nose (Cx3,Exc)   Squamous cell carcinoma in situ (SCCIS) 09/27/2005   Nose (Cx3,Exc)   Squamous cell carcinoma in situ (SCCIS) 02/18/2013   Below Left Eye (tx p bx)   Squamous cell carcinoma in situ (SCCIS) 04/16/2013   Left [place] (Cx3,5FU), Below Left Eye (Cx3,5FU), Below Right Eye (Cx3,5FU)   Squamous cell carcinoma in situ (SCCIS) 08/04/2014   Left Cheek (Cx3,5FU)   Squamous cell carcinoma in situ (SCCIS) 09/13/2016   Right Inner Eye Sup. 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   Type 2 diabetes  mellitus with vascular disease ([place]) 05/21/2007   Qualifier: Diagnosis of  By: Diona Browner MD, [name]     Type 2 DM with CKD stage 3 and hypertension ([place]) 07/16/2015   Unspecified essential hypertension     Past Surgical History:  Procedure Laterality Date   HERNIA REPAIR     LEFT HEART CATHETERIZATION WITH CORONARY ANGIOGRAM N/A 09/13/2012   Procedure: LEFT HEART CATHETERIZATION WITH CORONARY ANGIOGRAM;  Surgeon: Sherren Mocha, MD;  Location[facility] C[facility] LAB;  Service: Cardiovascular;  Laterality: N/A;   LOBECTOMY  2001   upper left   PERICARDIAL TAP N/A 09/16/2012   Procedure: PERICARDIAL TAP;  Surgeon: Sinclair Grooms, MD;  Location: [facility] CATH LAB;  Service: Cardiovascular;  Laterality: N/A;   PROSTATECTOMY  2003   SUBXYPHOID PERICARDIAL WINDOW N/A 09/16/2012   Procedure: SUBXYPHOID PERICARDIAL WINDOW;  Surgeon: Rexene Alberts, MD;  Location: [facility];  Service: Thoracic;  Laterality: N/A;   TONSILLECTOMY      There were no vitals filed for this visit.    Subjective Assessment - 04/14/21 1406     Subjective Pt reports prior to CVA he was independent and did not need to use a RW. Family notes pt was independent with all his home activities except due to his memory issues he is not safe to do his own cooking. Pt's wife has a caregiver who assists with her care and assists with meals. Pt also notes that at baseline he has decreased visition from macular degeneration and increased SHOB after he had a lung resection. Pt is currently 2 weeks s/p inpatient rehab and family notes a decrease in his function.    Patient is accompained by: Family member    Pertinent History R pontine CVA 03/07/21. History of Alzheimer's dementia maintained on Aricept type 2 diabetes mellitus hypertension CKD stage III macular degeneration adenocarcinoma left lung with upper lobe resection 2001 prostate cancer with prostatectomy    Limitations Walking;Standing;House hold activities    How long can you sit comfortably? n/a     How long can you stand comfortably? As long as holding on to something    How long can you walk comfortably? Mostly in the house    Patient Stated Goals Pt: walk again without RW. Family: pt amb ind to bathroom (currently requires assist for safety)    Currently in Pain? No/denies                [facility] PT Assessment - 04/14/21 0001  Assessment   Medical Diagnosis R Pontine CVA    Referring Provider (PT) Cathlyn Parsons, PA-C    Onset Date/Surgical Date 03/07/21    Prior Therapy Inpatient rehab 03/12/21 to 04/02/21      Precautions   Precautions Fall      Restrictions   Weight Bearing Restrictions No      Balance Screen   Has the patient fallen in the past 6 months No      [place] residence    Living Arrangements Spouse/significant other   wife has a caregiver who assists   Available Help at Discharge Family    Type of [place] entrance    [place] One level    [place] - 2 wheels;Wheelchair - manual    Additional Comments raised BSC and lift chair      Prior Function   Vocation Retired    Leisure Walk around the block      Observation/Other Assessments   Focus on Therapeutic Outcomes (FOTO)  49 (risk adjusted 58); predicted 65      Sensation   Light Touch Appears Intact      Coordination   Gross Motor Movements are Fluid and Coordinated No      Tone   Assessment Location Left Lower Extremity      Strength   Right Hip Flexion 4+/5    Left Hip Flexion 3/5    Right Knee Flexion 4+/5    Right Knee Extension 4+/5    Left Knee Flexion 4/5    Left Knee Extension 4/5    Right Ankle Dorsiflexion 4/5    Right Ankle Plantar Flexion 4/5    Left Ankle Dorsiflexion 2/5    Left Ankle Plantar Flexion 3/5      Bed Mobility   Supine to Sit Supervision/Verbal cueing    Sit to Supine Supervision/Verbal cueing      Transfers   Five time sit to stand comments  15.68 sec       Ambulation/Gait   Ambulation Distance (Feet) 200 Feet    Assistive device Rolling walker   posterior L ottobock   Gait Pattern Step-through pattern;Decreased dorsiflexion - left;Decreased stance time - left;Lateral trunk lean to right;Poor foot clearance - left    Ambulation Surface Level;Indoor      Standardized Balance Assessment   Standardized Balance Assessment Berg Balance Test;Timed Up and Go Test;Dynamic Gait Index      Berg Balance Test   Sit to Stand Able to stand  independently using hands    Standing Unsupported Able to stand 2 minutes with supervision    Sitting with Back Unsupported but Feet Supported on Floor or Stool Able to sit safely and securely 2 minutes    Stand to Sit Controls descent by using hands    Transfers Able to transfer safely, definite need of hands    Standing Unsupported with Eyes Closed Able to stand 10 seconds with supervision    Standing Unsupported with Feet Together Able to place feet together independently and stand for 1 minute with supervision    From Standing, Reach Forward with Outstretched Arm Can reach confidently >25 cm (10")    From Standing Position, Pick up Object from Floor Able to pick up shoe safely and easily    From Standing Position, Turn to Look Behind Over each Shoulder Looks behind one side only/other side shows less weight shift    Turn  360 Degrees Needs close supervision or verbal cueing    Standing Unsupported, Alternately Place Feet on Step/Stool Needs assistance to keep from falling or unable to try    Standing Unsupported, One Foot in [place] to take small step independently and hold 30 seconds    Standing on One Leg Unable to try or needs assist to prevent fall    Total Score 36    Berg comment: 36/56      Dynamic Gait Index   Level Surface Mild Impairment    Change in Gait Speed Mild Impairment    Gait with Horizontal Head Turns Mild Impairment    Gait with Vertical Head Turns Mild Impairment    Gait and Pivot Turn  Mild Impairment    Step Over Obstacle Severe Impairment    Step Around Obstacles Mild Impairment    Steps Moderate Impairment    Total Score 13    DGI comment: 13/24      Timed Up and Go Test   Normal TUG (seconds) 25.09      LLE Tone   LLE Tone Modified Ashworth      LLE Tone   Modified Ashworth Scale for Grading Hypertonia LLE Slight increase in muscle tone, manifested by a catch, followed by minimal resistance throughout the remainder (less than half) of the ROM                        Objective measurements completed on examination: See above findings.                PT Education - 04/14/21 1507     Education Details Exam findings, POC    Person(s) Educated Patient;Child(ren)    Methods Explanation;Demonstration;Tactile cues;Verbal cues    Comprehension Returned demonstration;Verbalized understanding;Verbal cues required;Tactile cues required              PT Short Term Goals - 04/14/21 1512       PT SHORT TERM GOAL #1   Title Pt will perform exercises at home at least 3x/wk with supervision as needed    Time 4    Period Weeks    Status New    Target Date 05/12/21      PT SHORT TERM GOAL #2   Title Pt will have improved 5x STS to </=13 sec    Baseline 15.28 sec on eval    Time 4    Period Weeks    Status New    Target Date 05/12/21      PT SHORT TERM GOAL #3   Title Pt will be mod independent with ambulating to/from bathroom for safer home mobility    Time 4    Period Weeks    Status New    Target Date 05/12/21      PT SHORT TERM GOAL #4   Title Pt will have improved Berg Balance Score to at least 42/56    Baseline 36/56    Time 4    Period Weeks    Status New    Target Date 05/12/21      PT SHORT TERM GOAL #5   Title Pt will demo improved TUG score to </=20 sec for decreased fall risk    Baseline 25.09 sec on eval    Time 4    Period Weeks    Status New    Target Date 05/12/21               PT Long  Term  Goals - 04/14/21 1515       PT LONG TERM GOAL #1   Title Pt will be independent with maintaining exercises at home    Time 8    Period Weeks    Status New    Target Date 06/09/21      PT LONG TERM GOAL #2   Title Pt will be able to amb with LRAD mod I x 800' for safe limited community amb    Time 8    Period Weeks    Status New    Target Date 06/09/21      PT LONG TERM GOAL #3   Title Pt will demo improved Berg Balance Score of at least 45/56 for decreased fall risk    Time 8    Period Weeks    Status New    Target Date 06/09/21      PT LONG TERM GOAL #4   Title Pt will have improved DGI to at least 19/24 to demo decreased fall risk    Baseline 13/24    Time 8    Period Weeks    Status New    Target Date 06/09/21      PT LONG TERM GOAL #5   Title Pt will have improved FOTO score of 65    Baseline 49    Time 8    Period Weeks    Status New    Target Date 06/09/21                    Plan - 04/14/21 1507     Clinical Impression Statement [name] is an 88 y/o M presenting to OPPT s/p R pontine CVA. Pt was [facility] to [facility] from 12/9 to 12/30. Pt has been at home x 2 weeks. PMH significnat for macular degeneration, HOH, and left lung resection. Assessment today is significant for L>R LE weakness, decreased L LE coordination, decreased static and dynamic balance with a high risk of falls, and mild L extensor tone. Pt would highly benefit from PT to address his mobility issues to improve safety with home tasks.    Personal Factors and Comorbidities Age;Fitness;Time since onset of injury/illness/exacerbation    Examination-Activity Limitations Locomotion Level;Bathing;Squat;Stairs;Stand;Toileting;Transfers;Bed Mobility;Caring for Others    Examination-Participation Restrictions Meal Prep;Cleaning;Community Activity;Laundry;Shop    Stability/Clinical Decision Making Evolving/Moderate complexity    Clinical Decision Making Moderate    Rehab Potential Good    PT  Frequency 2x / week    PT Duration 8 weeks    PT Treatment/Interventions ADLs/Self Care Home Management;Aquatic Therapy;Electrical Stimulation;Moist Heat;DME Instruction;Gait training;Stair training;Functional mobility training;Therapeutic activities;Therapeutic exercise;Balance training;Neuromuscular re-education;Patient/family education;Orthotic Fit/Training;Manual techniques;Passive range of motion;Dry needling;Taping    PT Next Visit Plan Initiate strengthening and balance program for home. Work on gait and LE coordination. Focus on weight shifting    Consulted and Agree with Plan of Care Patient;Family member/caregiver             Patient will benefit from skilled therapeutic intervention in order to improve the following deficits and impairments:  Abnormal gait, Decreased coordination, Impaired tone, Decreased endurance, Impaired UE functional use, Decreased activity tolerance, Decreased balance, Improper body mechanics, Decreased mobility, Decreased strength, Postural dysfunction  Visit Diagnosis: Right pontine cerebrovascular accident [facility])  Muscle weakness (generalized)  Difficulty in walking, not elsewhere classified  Unsteadiness on feet     Problem List Patient Active Problem List   Diagnosis Date Noted   Right pontine cerebrovascular accident ([place]) 03/12/2021  Acute ischemic stroke ([place]) 03/08/2021   Hyperkalemia 03/08/2021   Hypomagnesemia 03/08/2021   GERD (gastroesophageal reflux disease) 03/08/2021   Acute CVA (cerebrovascular accident) ([place]) 03/08/2021   COVID-19 01/01/2021   Sensorineural hearing loss (SNHL) of both ears 05/15/2017   Tinnitus, bilateral 05/15/2017   Cough 06/08/2015   Macular degeneration of both eyes 01/21/2014   Tobacco abuse, 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/08/2013   CAD (coronary artery disease)    Pericardial tamponade 09/17/2012   Acute pericarditis, unspecified 09/13/2012   Adenocarcinoma of left lung, stage 1 ([place]) 07/25/2011   Cancer of  prostate w/med recur risk (T2b-c or Gleason 7 or PSA 10-20) ([place]) 07/25/2011   Nonmelanoma skin cancer 07/25/2011   History of colonic polyps 07/25/2011   Type 2 diabetes mellitus with vascular disease ([place]) 05/21/2007   HYPERCHOLESTEROLEMIA 05/21/2007   CARCINOMA, SKIN, SQUAMOUS CELL 01/08/2007   Generalized anxiety disorder 01/08/2007   ALZHEIMER'S DISEASE, MILD 01/08/2007   Essential hypertension 01/08/2007    Shatonia Hoots April Gordy Levan, PT, DPT 04/14/2021, 3:19 PM  [facility] [address] [place], [place], [address] Phone: [phone]   Fax:  [phone]  Name: [name] MRN: [id number] Date of Birth: 08-May-1930

## 2021-04-15 ENCOUNTER — Other Ambulatory Visit: Payer: Self-pay

## 2021-04-15 ENCOUNTER — Encounter: Payer: Self-pay | Admitting: Family Medicine

## 2021-04-15 ENCOUNTER — Ambulatory Visit (INDEPENDENT_AMBULATORY_CARE_PROVIDER_SITE_OTHER): Payer: Medicare HMO | Admitting: Family Medicine

## 2021-04-15 VITALS — BP 124/70 | HR 72 | Temp 97.7°F | Ht 69.0 in

## 2021-04-15 DIAGNOSIS — E1159 Type 2 diabetes mellitus with other circulatory complications: Secondary | ICD-10-CM

## 2021-04-15 DIAGNOSIS — I639 Cerebral infarction, unspecified: Secondary | ICD-10-CM

## 2021-04-15 DIAGNOSIS — E7800 Pure hypercholesterolemia, unspecified: Secondary | ICD-10-CM

## 2021-04-15 DIAGNOSIS — E1122 Type 2 diabetes mellitus with diabetic chronic kidney disease: Secondary | ICD-10-CM | POA: Diagnosis not present

## 2021-04-15 DIAGNOSIS — I10 Essential (primary) hypertension: Secondary | ICD-10-CM | POA: Diagnosis not present

## 2021-04-15 DIAGNOSIS — N183 Chronic kidney disease, stage 3 unspecified: Secondary | ICD-10-CM | POA: Diagnosis not present

## 2021-04-15 LAB — CBC WITH DIFFERENTIAL/PLATELET
Basophils Absolute: 0 10*3/uL (ref 0.0–0.1)
Basophils Relative: 0.3 % (ref 0.0–3.0)
Eosinophils Absolute: 0 10*3/uL (ref 0.0–0.7)
Eosinophils Relative: 0.5 % (ref 0.0–5.0)
HCT: 33.7 % — ABNORMAL LOW (ref 39.0–52.0)
Hemoglobin: 11 g/dL — ABNORMAL LOW (ref 13.0–17.0)
Lymphocytes Relative: 7.2 % — ABNORMAL LOW (ref 12.0–46.0)
Lymphs Abs: 0.5 10*3/uL — ABNORMAL LOW (ref 0.7–4.0)
MCHC: 32.7 g/dL (ref 30.0–36.0)
MCV: 98.8 fl (ref 78.0–100.0)
Monocytes Absolute: 1 10*3/uL (ref 0.1–1.0)
Monocytes Relative: 15.5 % — ABNORMAL HIGH (ref 3.0–12.0)
Neutro Abs: 5 10*3/uL (ref 1.4–7.7)
Neutrophils Relative %: 76.5 % (ref 43.0–77.0)
Platelets: 160 10*3/uL (ref 150.0–400.0)
RBC: 3.41 Mil/uL — ABNORMAL LOW (ref 4.22–5.81)
RDW: 13.1 % (ref 11.5–15.5)
WBC: 6.5 10*3/uL (ref 4.0–10.5)

## 2021-04-15 LAB — HEPATIC FUNCTION PANEL
ALT: 9 U/L (ref 0–53)
AST: 10 U/L (ref 0–37)
Albumin: 4 g/dL (ref 3.5–5.2)
Alkaline Phosphatase: 85 U/L (ref 39–117)
Bilirubin, Direct: 0.1 mg/dL (ref 0.0–0.3)
Total Bilirubin: 0.4 mg/dL (ref 0.2–1.2)
Total Protein: 6.7 g/dL (ref 6.0–8.3)

## 2021-04-15 LAB — BASIC METABOLIC PANEL
BUN: 20 mg/dL (ref 6–23)
CO2: 30 mEq/L (ref 19–32)
Calcium: 9.4 mg/dL (ref 8.4–10.5)
Chloride: 103 mEq/L (ref 96–112)
Creatinine, Ser: 1.54 mg/dL — ABNORMAL HIGH (ref 0.40–1.50)
GFR: 40.03 mL/min — ABNORMAL LOW (ref >60.00)
Glucose, Bld: 254 mg/dL — ABNORMAL HIGH (ref 70–99)
Potassium: 5.2 mEq/L — ABNORMAL HIGH (ref 3.5–5.1)
Sodium: 138 mEq/L (ref 135–145)

## 2021-04-15 MED ORDER — GLIPIZIDE 5 MG PO TABS: 5.0000 mg | ORAL_TABLET | Freq: Two times a day (BID) | ORAL | 3 refills | Status: DC

## 2021-04-15 NOTE — Progress Notes (Signed)
Kelwin Gibler T. Avery Klingbeil, MD, [place] at [facility] [place], [address]  Phone: [phone]   FAX: [phone]  GAGANDEEP PETTET - 88 y.o. male   MRN [id number]   Date of Birth: 09-11-1930  Date: 04/15/2021   PCP: [name], MD   Referral: [name], MD  Chief Complaint  Patient presents with   Hospitalization Follow-up   Hyperglycemia    This visit occurred during the SARS-CoV-2 public health emergency.  Safety protocols were in place, including screening questions prior to the visit, additional usage of staff PPE, and extensive cleaning of exam room while observing appropriate contact time as indicated for disinfecting solutions.   Subjective:   [name] is a 88 y.o. very pleasant male patient with Body mass index is 26.89 kg/m. who presents with the following:  Admit date: 03/12/2021 Discharge date: 04/02/2021 This is when he was admitted with inpatient rehab.  03/07/2021 Admit date: 03/07/2021 Discharge date:   03/12/2021 Initial hospitalization.  Patient presents today with presumed follow-up with diabetes, however he actually is here and has had a extensive hospitalization from the above dates.  This is after a right pontine stroke.  He presented to the hospital on March 07, 2021, that point did have some left-sided weakness with a normal cranial CT and MRI showed ischemia in the ventral right pons and left frontal periventricular matter.  Neurology was involved, and he was started on daily aspirin 81 mg as well as Plavix.  This was done for 3 weeks, then discontinue and aspirin 81 mg alone.  At the hospital, his blood sugars were elevated and his A1c was 8.3.  Metformin was held due to some increased creatinine.  He was managed in inpatient rehab,.  And he was also seen last on April 13, 2021 with physical medicine and rehab.  He is now on glipizide 5 mg before breakfast  BP  124/70  All > 200  Seeing Dr. Leonie Man and Dr. Letta Pate.   At the beginning, could not move at all on the L side.   Lab Review:  CBC EXTENDED Latest Ref Rng & Units 03/31/2021 03/30/2021 03/15/2021  WBC 4.0 - 10.5 K/uL 6.2 5.7 5.4  RBC 4.22 - 5.81 MIL/uL 3.01(L) 3.05(L) 3.60(L)  HGB 13.0 - 17.0 g/dL 10.1(L) 10.3(L) 12.2(L)  HCT 39.0 - 52.0 % 31.1(L) 31.3(L) 36.5(L)  PLT 150 - 400 K/uL 122(L) 120(L) 110(L)  NEUTROABS 1.7 - 7.7 K/uL - 3.8 3.2  LYMPHSABS 0.7 - 4.0 K/uL - 0.5(L) 0.6(L)    BMP Latest Ref Rng & Units 03/30/2021 03/15/2021 03/11/2021  Glucose 70 - 99 mg/dL 170(H) 213(H) 170(H)  BUN 8 - 23 mg/dL 23 25(H) 22  Creatinine 0.61 - 1.24 mg/dL 1.51(H) 1.43(H) 1.41(H)  Sodium 135 - 145 mmol/L 139 139 135  Potassium 3.5 - 5.1 mmol/L 4.4 4.0 4.4  Chloride 98 - 111 mmol/L 108 104 105  CO2 22 - 32 mmol/L '26 27 25  ' Calcium 8.9 - 10.3 mg/dL 8.5(L) 8.7(L) 8.7(L)    Hepatic Function Latest Ref Rng & Units 03/30/2021 03/15/2021 03/08/2021  Total Protein 6.5 - 8.1 g/dL 5.7(L) 5.8(L) 3.8(L)  Albumin 3.5 - 5.0 g/dL 3.0(L) 3.4(L) 2.1(L)  AST 15 - 41 U/L 11(L) 16 6(L)  ALT 0 - 44 U/L '12 19 6  ' Alk Phosphatase 38 - 126 U/L 65 70 48  Total Bilirubin 0.3 - 1.2 mg/dL 0.6 0.6 0.6  Bilirubin, Direct 0.0 - 0.3  mg/dL - - -    Lab Results  Component Value Date   CHOL 122 03/08/2021   Lab Results  Component Value Date   HDL 17 (L) 03/08/2021   Lab Results  Component Value Date   LDLCALC 83 03/08/2021   Lab Results  Component Value Date   TRIG 109 03/08/2021   Lab Results  Component Value Date   CHOLHDL 7.2 03/08/2021   No results for input(s): PSA in the last 72 hours. No results found for: HCVAB No results found for: VD25OH   Lab Results  Component Value Date   HGBA1C 8.3 (H) 03/08/2021   HGBA1C 7.3 (H) 05/11/2020   HGBA1C 7.0 (H) 12/20/2019   Lab Results  Component Value Date   MICROALBUR 7.8 (H) 12/20/2019   LDLCALC 83 03/08/2021   CREATININE 1.51 (H) 03/30/2021       Review of Systems is noted in the HPI, as appropriate  Objective:   BP 124/70    Pulse 72    Temp 97.7 F (36.5 C) (Temporal)    Ht '5\' 9"'  (1.753 m)    SpO2 99%    BMI 26.89 kg/m   GEN: No acute distress; alert,appropriate. PULM: Breathing comfortably in no respiratory distress PSYCH: Normally interactive.  CV: RRR, no m/g/r  PULM: Normal respiratory rate, no accessory muscle use. No wheezes, crackles or rhonchi  Neuro: He is able to move his left side some and extend his left knee, he is wearing an AFO brace.  Compared to his prior exams, this is dramatically altered and weak.  He is in a wheelchair.  Laboratory and Imaging Data:  Assessment and Plan:     ICD-10-CM   1. Acute CVA (cerebrovascular accident) ([place])  I63.9 CBC with Differential/Platelet    Hepatic function panel    2. Type 2 diabetes mellitus with vascular disease (HCC)  E11.59 CBC with Differential/Platelet    glipiZIDE (GLUCOTROL) 5 MG tablet    3. HYPERCHOLESTEROLEMIA  E78.00 CBC with Differential/Platelet    Hepatic function panel    4. Essential hypertension  I10 CBC with Differential/Platelet    5. CKD stage 3 due to type 2 diabetes mellitus (HCC)  V78.58 Basic metabolic panel   I50.27 CBC with Differential/Platelet     Total encounter time: 40 minutes. This includes total time spent on the day of encounter.  A great deal of additional time is spent an extensive review of the patient's medical record.  Also reviewed the case in terms of diabetes with one of my partners.  For now I am going to increase his glipizide to twice daily and then have him record his blood sugars and his wife will bring them in with him in 1 months.  We may need to tailor this even more.  Ideally, he will have lower blood sugars given his recent stroke.  Really encouraged him to continue with his neuro rehab.  Meds ordered this encounter  Medications   glipiZIDE (GLUCOTROL) 5 MG tablet    Sig: Take 1 tablet (5 mg total) by  mouth 2 (two) times daily before a meal.    Dispense:  60 tablet    Refill:  3   Medications Discontinued During This Encounter  Medication Reason   glipiZIDE (GLUCOTROL) 5 MG tablet    Orders Placed This Encounter  Procedures   Basic metabolic panel   CBC with Differential/Platelet   Hepatic function panel    Follow-up: Return in about 1 month (around 05/16/2021) for Dr  Raychell Holcomb, diabetes recheck.  Dragon [employer] One speech-to-text software was used for transcription in this dictation.  Possible transcriptional errors can occur using Editor, commissioning.   Signed,  [name]. Kaisley Stiverson, MD   Outpatient Encounter Medications as of 04/15/2021  Medication Sig   acetaminophen (TYLENOL) 325 MG tablet Take 2 tablets (650 mg total) by mouth every 6 (six) hours as needed for mild pain (or Fever >/= 101).   aspirin EC 81 MG tablet Take 81 mg by mouth daily as needed for moderate pain.   atorvastatin (LIPITOR) 20 MG tablet Take 1 tablet (20 mg total) by mouth daily at 6 PM.   cyanocobalamin 1000 MCG tablet Take 1 tablet (1,000 mcg total) by mouth daily.   donepezil (ARICEPT) 10 MG tablet TAKE 1 TABLET BY MOUTH EVERYDAY AT BEDTIME   ferrous sulfate 325 (65 FE) MG tablet Take 1 tablet (325 mg total) by mouth daily with breakfast.   Multiple Vitamins-Minerals (ICAPS LUTEIN & ZEAXANTHIN PO) Take 1 capsule by mouth in the morning and at bedtime. I - Caps   Multiple Vitamins-Minerals (ICAPS) CAPS Take by mouth.   Omega-3 Fatty Acids (FISH OIL) 1000 MG CAPS Take 1 capsule (1,000 mg total) by mouth in the morning and at bedtime.   pantoprazole (PROTONIX) 40 MG tablet Take 1 tablet (40 mg total) by mouth daily.   polyethylene glycol (MIRALAX / GLYCOLAX) 17 g packet Take 17 g by mouth daily.   Propylene Glycol (SYSTANE BALANCE) 0.6 % SOLN Apply 1 drop to eye 2 (two) times daily as needed (dry eyes).   senna-docusate (SENOKOT-S) 8.6-50 MG tablet Take 2 tablets by mouth 2 (two) times daily.   sertraline  (ZOLOFT) 100 MG tablet Take 1 tablet (100 mg total) by mouth daily.   tamsulosin (FLOMAX) 0.4 MG CAPS capsule Take 1 capsule (0.4 mg total) by mouth daily after supper.   [DISCONTINUED] glipiZIDE (GLUCOTROL) 5 MG tablet Take 1 tablet (5 mg total) by mouth daily before breakfast.   glipiZIDE (GLUCOTROL) 5 MG tablet Take 1 tablet (5 mg total) by mouth 2 (two) times daily before a meal.   No facility-administered encounter medications on file as of 04/15/2021.

## 2021-04-15 NOTE — Therapy (Signed)
[facility] [address] [place], [place], [address] Phone: [phone]   Fax:  [phone]  Occupational Therapy Evaluation  Patient Details  Name: [name] MRN: [id number] Date of Birth: 1931/02/14 No data recorded  Encounter Date: 04/14/2021   OT End of Session - 04/15/21 1545     Visit Number 1    Number of Visits 25    Date for OT Re-Evaluation 07/08/21    Authorization Type [employer]    Authorization - Visit Number 1    Progress Note Due on Visit 10    OT Start Time 1450    OT Stop Time 8768    OT Time Calculation (min) 40 min             Past Medical History:  Diagnosis Date   Adenocarcinoma of left lung, stage 1 ([place]) 2001   T1N0 stage I  adenoca left lung resected 01/03/00    Alzheimer's disease ([place])    CAD (coronary artery disease) 2014   a. 09/2012 Cath: LM nl, LAD 50-60p, D1 60-70 m, D1 50ost, LCX nl, RCA min irregs, EF 55-65%.   Generalized anxiety disorder 01/08/2007   Qualifier: Diagnosis of  By: Diona Browner MD, [name]     History of colonic polyps 07/25/2011   Macular degeneration of both eyes 01/21/2014   Nonmelanoma skin cancer 07/25/2011   Multiple lesions excised face/nose   Pericarditis    a. 09/2012 with effusion and tamponade, s/p window.  b.  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 SCC IN SITU TX CX3   SCC (squamous cell carcinoma) 04/12/2005   RIGHT NECK SUP. SCC IN SITU TX EXC   SCC (squamous cell carcinoma) 04/16/2013   LEFT TEMPLE SCC IN SITU TX CX3 5FU   SCC (squamous cell carcinoma) 04/16/2013   BELOW LEFT EYE SCC IN SITU TX CX3 5FU   SCC (squamous cell carcinoma) 04/16/2013   BELOW RIGHT EYE SCC IN SITU TX CX3 5FU   SCC (squamous cell carcinoma) 08/04/2014   LEFT CHEEK SCC IN SITU TX CX3 5FU   SCC (squamous cell carcinoma) 11/27/2018   LEFT TEMPLE SCC IN SITU TX WITH BX   SCC (squamous cell carcinoma)    SCC (squamous cell carcinoma)    SCC (squamous cell carcinoma)    SCC (squamous cell carcinoma) Well Diff 09/13/2016   Tip of Nose SCC WELL DIFF TX (MOH's), and RIGHT INNER EYE SUP. 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   Type 2 diabetes mellitus with vascular disease ([place]) 05/21/2007   Qualifier: Diagnosis of  By: Diona Browner MD, [name]     Type 2 DM with CKD stage 3 and  hypertension ([place]) 07/16/2015   Unspecified essential hypertension     Past Surgical History:  Procedure Laterality Date   HERNIA REPAIR     LEFT HEART CATHETERIZATION WITH CORONARY ANGIOGRAM N/A 09/13/2012   Procedure: LEFT HEART CATHETERIZATION WITH CORONARY ANGIOGRAM;  Surgeon: Sherren Mocha, MD;  Location: [facility] CATH LAB;  Service: Cardiovascular;  Laterality: N/A;   LOBECTOMY  2001   upper left   PERICARDIAL TAP N/A 09/16/2012   Procedure: PERICARDIAL TAP;  Surgeon: Sinclair Grooms, MD;  Location: [facility] CATH LAB;  Service: Cardiovascular;  Laterality: N/A;   PROSTATECTOMY  2003   SUBXYPHOID PERICARDIAL WINDOW N/A 09/16/2012   Procedure: SUBXYPHOID PERICARDIAL WINDOW;  Surgeon: Rexene Alberts, MD;  Location: [facility];  Service: Thoracic;  Laterality: N/A;   TONSILLECTOMY      There were no vitals filed for this visit.   Subjective Assessment - 04/14/21 1453     Subjective  Pt wants to walk better    Pertinent History Pt will verbalize understanding of adapted strategies to maximize safety and I with ADLs/ IADLs .    Limitations Pt is an 88 year old man admitted on 03/07/21 with L side weakness. MRI + for small foci ischemia R pons and L frontal periventricular white matter. PMH. Alzheimers dementia, DM, HTN, stage 3 CKD, lung ca, prostate ca, multiple skin cancers, CAD, anxiety, macular degeneration. Pt lives with wife who has hired caregiver, children currently Artist by [name].    Patient Stated Goals get left  arm working, increase independence    Currently in Pain? No/denies               [facility] OT Assessment - 04/14/21 1456       Assessment   Medical Diagnosis R Pontine CVA    Onset Date/Surgical Date 03/07/21    Prior Therapy Inpatient rehab 03/12/21 to 04/02/21      Precautions   Precautions Fall      Balance Screen   Has the patient fallen in the past 6 months No    Has the patient had a decrease in activity level because of a fear of falling?   No    Is the patient reluctant to leave their home because of a fear of falling?  No      Home  Environment   Family/patient expects to be discharged to: Private residence    Living  Arrangements Spouse/significant other   children   Lives With Spouse;Other (Comment)   caregiver     Prior Function   Leisure Walk around the block      ADL   Eating/Feeding Needs assist with cutting food    Grooming Modified independent    Upper Body Bathing Minimal assistance    Lower Body Bathing Supervision/safety    Upper Body Dressing Minimal assistance    Lower Body Dressing Moderate assistance    Toilet Transfer Minimal assistance    Toileting -  Hygiene Minimal assistance    Tub/Shower Transfer Minimal assistance      Mobility   Mobility Status Needs assist   RW     Written Expression   Dominant Hand Right      Vision - History   Visual History Macular degeneration    Patient Visual Report Central vision impairment      Vision Assessment   Vision Assessment Vision impaired  _ to be further tested in functional context      Cognition   Overall Cognitive Status Impaired/Different from baseline    Memory Impaired    Memory Impairment Decreased short term memory    Cognition Comments oriented to month and year not date, day of the week      Sensation   Light Touch Appears Intact    Hot/Cold Appears Intact      Coordination   Gross Motor Movements are Fluid and Coordinated No    Fine Motor Movements are Fluid and Coordinated No    9 Hole Peg Test Right;Left    Right 9 Hole Peg Test 43.91    Left 9 Hole Peg Test 3 pegs in 2 mins    Box and Blocks R 43, L 28      ROM / Strength   AROM / PROM / Strength AROM      AROM   Overall AROM  Deficits    Overall AROM Comments RUE WFLS, LUE sh.flexion 80, with min-mod compensation, elbow flexion 125, extensionWFLs, 75% supination, wrist extension 35*,95% composite finger flexion, 80% finger extension, difficulty extending ring and small  finger      Hand Function   Right Hand Grip (lbs) 65.9    Left Hand Grip (lbs) 33.7                                OT Short Term Goals - 04/15/21 1556       OT SHORT TERM GOAL #1   Title I with inital HEP.    Time 4    Period Weeks    Status New    Target Date 05/13/21      OT SHORT TERM GOAL #2   Title Pt will donn shirt with set up and supervision and pants with min A.    Time 4    Period Weeks    Status New      OT SHORT TERM GOAL #3   Title Pt will demonstrate 90* shoulder flexion in prep for functional reach with LUE with no more than min compensations.    Time 4    Period Weeks    Status New      OT SHORT TERM GOAL #4   Title Pt will verbalize understanding of memory compensation strategies.    Time 4    Period Weeks    Status New      OT SHORT TERM GOAL #5  Title Pt will demonstrate improved LUE functional use as evidenced by increasing box/ blocks score by 5 blocks    Baseline R 43 blocks, LUE 28 blocks    Time 4    Period Weeks    Status New      Additional Short Term Goals   Additional Short Term Goals Yes      OT SHORT TERM GOAL #6   Title Pt will verbalize understanding of adapted strategies to maximize safety and I with ADLs/ IADLs .    (YJ:EHUDJSH food)    Time 4    Period Weeks    Status New               OT Long Term Goals - 04/15/21 1605       OT LONG TERM GOAL #1   Title Pt will perform all basic ADLS with supervision.    Time 12    Period Weeks    Status New    Target Date 07/08/21      OT LONG TERM GOAL #2   Title Pt will demonstrate ability to retrieve a lightweight object at 100* with LUE    Time 12    Period Weeks    Status New      OT LONG TERM GOAL #3   Title Pt will demonstrate improved fine motor coordination for ADLs  as evidenced by placing 9 pegs in 90 secs or less for 9 hole peg test.    Time 12    Period Weeks    Status New      OT LONG TERM GOAL #4   Title Pt will increase LUE  grip strength by 5 lbs for increased LUE functional use.    Baseline RUE 65.9 LUE 33.7    Time 12    Period Weeks    Status New      OT LONG TERM GOAL #5   Title Pt will perform simple beverage prep with supervision.    Time 12    Period Weeks    Status New                   Plan - 04/15/21 1546     Clinical Impression Statement Pt is an 88 year old man hospitalize on 03/07/21 with L side weakness. MRI + for small foci ischemia R pons and L frontal periventricular white matter. PMH. Alzheimers dementia, DM, HTN, stage 3 CKD, lung ca, prostate ca, multiple skin cancers, CAD, anxiety, macular degeneration. Pt presents with the following deficits: decreased LUE strength, decreased coordination, decreased LUE functional use, visual deficits, cognitive deficits, decreased functional mobility, decreased balance which impedes performance of ADLs/IADLs. Pt can benefit from skilled occupational therapy to address these deficits in order to maximize pt's safety and I with daily activities. Pt lives with hsi wife who has a hired Futures trader her. Pt's children are currently assisting him.    OT Occupational Profile and History Detailed Assessment- Review of Records and additional review of physical, cognitive, psychosocial history related to current functional performance    Occupational performance deficits (Please refer to evaluation for details): ADL's;IADL's;Rest and Sleep;Leisure;Social Participation    Body Structure / Function / Physical Skills ADL;UE functional use;Endurance;Balance;Flexibility;Pain;Vision;FMC;Gait;ROM;GMC;Coordination;Decreased knowledge of precautions;Decreased knowledge of use of DME;IADL;Strength;Dexterity;Mobility    Rehab Potential Good    Clinical Decision Making Several treatment options, min-mod task modification necessary   visual and cognitive deficits, decrease coordiantion   Comorbidities Affecting Occupational Performance: May have comorbidities  impacting  occupational performance   Alzheimer's, macular degeneration, CVA   Modification or Assistance to Complete Evaluation  Min-Moderate modification of tasks or assist with assess necessary to complete eval   visual deficits, extra cueing and  assist required   OT Frequency 2x / week    OT Duration 12 weeks    OT Treatment/Interventions Self-care/ADL training;Ultrasound;Visual/perceptual remediation/compensation;Patient/family education;DME and/or AE instruction;Aquatic Therapy;Paraffin;Passive range of motion;Balance training;Gait Training;Stair Training;Fluidtherapy;Cryotherapy;Electrical Stimulation;Therapist, nutritional;Therapeutic activities;Manual Therapy;Therapeutic exercise;Splinting;Moist Heat;Neuromuscular education;Cognitive remediation/compensation    Plan initiate HEP for LUE, supine closed cahin, weightbearing, light functional use LUE    Consulted and Agree with Plan of Care Patient;Family member/caregiver    Family Member Consulted dtr and son             Patient will benefit from skilled therapeutic intervention in order to improve the following deficits and impairments:   Body Structure / Function / Physical Skills: ADL, UE functional use, Endurance, Balance, Flexibility, Pain, Vision, FMC, Gait, ROM, GMC, Coordination, Decreased knowledge of precautions, Decreased knowledge of use of DME, IADL, Strength, Dexterity, Mobility       Visit Diagnosis: Hemiplegia and hemiparesis following cerebral infarction affecting left non-dominant side (HCC) - Plan: Ot plan of care cert/re-cert  Muscle weakness (generalized) - Plan: Ot plan of care cert/re-cert  Unsteadiness on feet - Plan: Ot plan of care cert/re-cert  Other abnormalities of gait and mobility - Plan: Ot plan of care cert/re-cert  Visuospatial deficit - Plan: Ot plan of care cert/re-cert  Other lack of coordination - Plan: Ot plan of care cert/re-cert  Attention and concentration deficit - Plan: Ot  plan of care cert/re-cert    Problem List Patient Active Problem List   Diagnosis Date Noted   Right pontine cerebrovascular accident ([place]) 03/12/2021   Acute ischemic stroke ([facility]) 03/08/2021   Hyperkalemia 03/08/2021   Hypomagnesemia 03/08/2021   GERD (gastroesophageal reflux disease) 03/08/2021   Acute CVA (cerebrovascular accident) ([place]) 03/08/2021   COVID-19 01/01/2021   Sensorineural hearing loss (SNHL) of both ears 05/15/2017   Tinnitus, bilateral 05/15/2017   Cough 06/08/2015   Macular degeneration of both eyes 01/21/2014   Tobacco abuse, 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/08/2013   CAD (coronary artery disease)    Pericardial tamponade 09/17/2012   Acute pericarditis, unspecified 09/13/2012   Adenocarcinoma of left lung, stage 1 ([place]) 07/25/2011   Cancer of prostate w/med recur risk (T2b-c or Gleason 7 or PSA 10-20) ([place]) 07/25/2011   Nonmelanoma skin cancer 07/25/2011   History of colonic polyps 07/25/2011   Type 2 diabetes mellitus with vascular disease ([place]) 05/21/2007   HYPERCHOLESTEROLEMIA 05/21/2007   CARCINOMA, SKIN, SQUAMOUS CELL 01/08/2007   Generalized anxiety disorder 01/08/2007   ALZHEIMER'S DISEASE, MILD 01/08/2007   Essential hypertension 01/08/2007    Kaelan Amble, OT 04/15/2021, 4:19 PM Theone Murdoch, OTR/L [phone] Phone: [phone] 4:19 PM 04/15/21  [facility] [address] [place], [place], [address] Phone: [phone]   Fax:  [phone]  Name: [name] MRN: [id number] Date of Birth: 05-Mar-1931

## 2021-04-15 NOTE — Therapy (Signed)
[name] [address] [place], [place], [address] Phone: [phone]   Fax:  [phone]  Speech Language Pathology Evaluation  Patient Details  Name: [name] MRN: [id number] Date of Birth: [birth year] Referring Provider (SLP): Cathlyn Parsons, PA-C   Encounter Date: 04/14/2021   End of Session - 04/14/21 1614     Visit Number 1    Number of Visits 1    SLP Start Time 1530    SLP Stop Time  4401    SLP Time Calculation (min) 40 min    Activity Tolerance Patient tolerated treatment well             Past Medical History:  Diagnosis Date   Adenocarcinoma of left lung, stage 1 ([place]) 2001   T1N0 stage I  adenoca left lung resected 01/03/00    Alzheimer's disease ([place])    CAD (coronary artery disease) 2014   a. 09/2012 Cath: LM nl, LAD 50-60p, D1 60-70 m, D1 50ost, LCX nl, RCA min irregs, EF 55-65%.   Generalized anxiety disorder 01/08/2007   Qualifier: Diagnosis of  By: [name], [name]     History of colonic polyps 07/25/2011   Macular degeneration of both eyes 01/21/2014   Nonmelanoma skin cancer 07/25/2011   Multiple lesions excised face/nose   Pericarditis    a. 09/2012 with effusion and tamponade, s/p window.  b.  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 SCC IN SITU TX CX3   SCC  (squamous cell carcinoma) 04/12/2005   RIGHT NECK SUP. SCC IN SITU TX EXC   SCC (squamous cell carcinoma) 04/16/2013   LEFT TEMPLE SCC IN SITU TX CX3 5FU   SCC (squamous cell carcinoma) 04/16/2013   BELOW LEFT EYE SCC IN SITU TX CX3 5FU   SCC (squamous cell carcinoma) 04/16/2013   BELOW RIGHT EYE SCC IN SITU TX CX3 5FU   SCC (squamous cell carcinoma) 08/04/2014   LEFT CHEEK SCC IN SITU TX CX3 5FU   SCC (squamous cell carcinoma) 11/27/2018   LEFT TEMPLE SCC IN SITU TX WITH BX   SCC (squamous cell carcinoma)    SCC (squamous cell carcinoma)    SCC (squamous cell carcinoma)    SCC (squamous cell carcinoma) Well Diff 09/13/2016   Tip of Nose SCC WELL DIFF TX (MOH's), and RIGHT INNER EYE SUP. SCC IN SITU TX TO WATCH   SCC (squamous cell carcinoma) Well Diff 05/30/2017   Right Cheekbone (Cx3,5FU) and Under Left Eye (Cx3,5FU)   Squamous cell carcinoma in situ (SCCIS) 04/12/2005   Right Neck Inf (Cx3), Right Neck Sup (Exc), and Bulb of Nose (Cx3,Exc)   Squamous cell carcinoma in situ (SCCIS) 09/27/2005   Nose (Cx3,Exc)  Squamous cell carcinoma in situ (SCCIS) 02/18/2013   Below Left Eye (tx p bx)   Squamous cell carcinoma in situ (SCCIS) 04/16/2013   Left Temple (Cx3,5FU), Below Left Eye (Cx3,5FU), Below Right Eye (Cx3,5FU)   Squamous cell carcinoma in situ (SCCIS) 08/04/2014   Left Cheek (Cx3,5FU)   Squamous cell carcinoma in situ (SCCIS) 09/13/2016   Right Inner Eye Sup. 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   Type 2 diabetes mellitus with vascular disease ([place]) 05/21/2007   Qualifier: Diagnosis of  By: [name], [name]     Type 2 DM with CKD stage 3 and hypertension ([place]) 07/16/2015   Unspecified  essential hypertension     Past Surgical History:  Procedure Laterality Date   HERNIA REPAIR     LEFT HEART CATHETERIZATION WITH CORONARY ANGIOGRAM N/A 09/13/2012   Procedure: LEFT HEART CATHETERIZATION WITH CORONARY ANGIOGRAM;  Surgeon: Ethan Mocha, Castillo;  Location: [facility] CATH LAB;  Service: Cardiovascular;  Laterality: N/A;   LOBECTOMY  2001   upper left   PERICARDIAL TAP N/A 09/16/2012   Procedure: PERICARDIAL TAP;  Surgeon: Ethan Grooms, Castillo;  Location: [facility] CATH LAB;  Service: Cardiovascular;  Laterality: N/A;   PROSTATECTOMY  2003   SUBXYPHOID PERICARDIAL WINDOW N/A 09/16/2012   Procedure: SUBXYPHOID PERICARDIAL WINDOW;  Surgeon: Ethan Alberts, Castillo;  Location: [facility];  Service: Thoracic;  Laterality: N/A;   TONSILLECTOMY      There were no vitals filed for this visit.       SLP Evaluation [facility] - 04/14/21 1539       SLP Visit Information   SLP Received On 03/30/21    Referring Provider (SLP) Cathlyn Parsons, PA-C    Onset Date 03-07-21    Medical Diagnosis Cerebral infarction due to unspecified occlusion or stenosis of unspecified cerebral artery      Subjective   Subjective "I'm doing well"      Pain Assessment   Currently in Pain? No/denies      General Information   HPI Patient is an 88 y.o. male with PMH: Alzheimer's dementia, DM-2, essential HTN, stage IIIb chronic kidney disease who was admitted to [facility] on 12/4 with acute ischemic CVA after presenting from home to [facility] ED c/o left sided weakness. CXR negative for any active processes, CT head negative for acute intracranial abnormality but MRI brain showing small foci of acute ischemia within ventral right pons and left forntal periventricular white matter; no hemorrhage or mass; diffuse, severe atrophy worst in frontal and parietal lobes.      Balance Screen   Has the patient fallen in the past 6 months No    Has the patient had a decrease in activity level because of a fear of falling?  No    Is the patient  reluctant to leave their home because of a fear of falling?  No      Prior Functional Status   Cognitive/Linguistic Baseline Baseline deficits    Baseline deficit details hx of Alzheimer's dementia, did not drive but high functioning, helped around the house    Type of Home House     Lives With Spouse;Other (Comment) caregiver  Available Support Family    Education 6th grade    Vocation Retired      Associate Professor   Overall Cognitive Status History of cognitive impairments - at baseline   Alzheimer's; managed on Aricept. No further concerns reported   Memory Impaired    Memory Impairment Decreased short term memory      Auditory Comprehension   Overall Auditory Comprehension Appears within functional limits for tasks assessed      Verbal Expression   Overall Verbal Expression Appears within functional limits for tasks assessed      Oral Motor/Sensory Function   Overall Oral Motor/Sensory Function Impaired -mild    Labial ROM Within Functional Limits    Labial Symmetry Abnormal symmetry left    Labial Strength Within Functional Limits    Labial Coordination WFL    Lingual ROM Within Functional Limits    Lingual Symmetry Within Functional Limits    Lingual Strength Within Functional Limits    Lingual Coordination Reduced   minimal   Facial ROM Within Functional Limits    Facial Symmetry Left droop   mild   Facial Coordination WFL      Motor Speech   Overall Motor Speech Appears within functional limits for tasks assessed    Respiration Within functional limits    Phonation Normal    Articulation Within functional limitis    Intelligibility Intelligible                             SLP Education - 04/15/21 0759     Education Details eval results, may request another script if changes or decline observed    Person(s) Educated Patient;Child(ren)    Methods Explanation    Comprehension Verbalized understanding                  Plan - 04/14/21 1615      Clinical Impression Statement "JC" was referred to OP ST following cerebral infarction in early December 2022. Pt was accompanied by his children, [place] and Ulice Dash. Pt completed CIR targeting dysphagia and dysarthria. Pt recalled addressing swallowing and speech, which have both significantly improved per patient and his children. Pt was discharged on regular texture and thin liquid diet (no straws and no mixed consistencies). Pt and family denied any further swallowing difficulty with no coughing/choking episodes reported. Pt is following compensatory measures diligently. No s/sx of aspiration PNA indicated by patient or family. Pt was 100% intelligible in 30+ minute conversation. Oral motor coordination revealed mild left facial droop and minimally reduced lingual coordination. This deficits did not impact overall intelligilbity or clarity of conversational speech. Pt and family denied any difficulty being understood by family members, friends, or medical professionals. Pt has baseline hx of Alzheimer's which is being well managed by Aricept. Pt and family denied any overt cognitive changes and indicated he is at cognitive linguistic baseline. Pt does not manage medications, bills, finances, or household tasks at this time. SLP assessed cognitive skills via modified version of SLUMS (written tasks deferred due to impaired vision), in which pt scored 14/24 which was consistent with dementia. Pt recalled 3/5 words after short delay and answered 2/4 questions about story accurately. Daughter felt hearing (only wearing 1 hearing aid) impacted his performance on test (despite use of clear mask for lip reading). Pt and family denied need for cognitive ST intervention at this time as pt is at baseline. SLP educated patient and family on ability to request  additional script for repeat ST evaluation if any changes or decline in cognition, speech, or swallowing indicated. All verbalized understanding. No ST intervention  to be provided at this time.    Speech Therapy Frequency One time visit    Potential to Achieve Goals Good    Consulted and Agree with Plan of Care Patient;Family member/caregiver             Patient will benefit from skilled therapeutic intervention in order to improve the following deficits and impairments:   Dysarthria and anarthria  Cognitive communication deficit    Problem List Patient Active Problem List   Diagnosis Date Noted   Right pontine cerebrovascular accident ([place]) 03/12/2021   Acute ischemic stroke ([place]) 03/08/2021   Hyperkalemia 03/08/2021   Hypomagnesemia 03/08/2021   GERD (gastroesophageal reflux disease) 03/08/2021   Acute CVA (cerebrovascular accident) ([place]) 03/08/2021   COVID-19 01/01/2021   Sensorineural hearing loss (SNHL) of both ears 05/15/2017   Tinnitus, bilateral 05/15/2017   Cough 06/08/2015   Macular degeneration of both eyes 01/21/2014   Tobacco abuse, 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/08/2013   CAD (coronary artery disease)    Pericardial tamponade 09/17/2012   Acute pericarditis, unspecified 09/13/2012   Adenocarcinoma of left lung, stage 1 ([place]) 07/25/2011   Cancer of prostate w/med recur risk (T2b-c or Gleason 7 or PSA 10-20) ([place]) 07/25/2011   Nonmelanoma skin cancer 07/25/2011   History of colonic polyps 07/25/2011   Type 2 diabetes mellitus with vascular disease ([place]) 05/21/2007   HYPERCHOLESTEROLEMIA 05/21/2007   CARCINOMA, SKIN, SQUAMOUS CELL 01/08/2007   Generalized anxiety disorder 01/08/2007   ALZHEIMER'S DISEASE, MILD 01/08/2007   Essential hypertension 01/08/2007    Alinda Deem, CCC-SLP 04/15/2021, 8:05 AM  [facility] [address] [place], [place], [address] Phone: [phone]   Fax:  [phone]  Name: AMAURIE WANDEL MRN: [id number] Date of Birth: 1931/02/07

## 2021-04-21 ENCOUNTER — Other Ambulatory Visit: Payer: Self-pay

## 2021-04-21 ENCOUNTER — Ambulatory Visit: Payer: Medicare HMO

## 2021-04-21 ENCOUNTER — Ambulatory Visit: Payer: Medicare HMO | Admitting: Occupational Therapy

## 2021-04-21 ENCOUNTER — Encounter: Payer: Self-pay | Admitting: Occupational Therapy

## 2021-04-21 DIAGNOSIS — M6281 Muscle weakness (generalized): Secondary | ICD-10-CM

## 2021-04-21 DIAGNOSIS — R2689 Other abnormalities of gait and mobility: Secondary | ICD-10-CM | POA: Diagnosis not present

## 2021-04-21 DIAGNOSIS — I69354 Hemiplegia and hemiparesis following cerebral infarction affecting left non-dominant side: Secondary | ICD-10-CM

## 2021-04-21 DIAGNOSIS — R262 Difficulty in walking, not elsewhere classified: Secondary | ICD-10-CM

## 2021-04-21 DIAGNOSIS — R278 Other lack of coordination: Secondary | ICD-10-CM | POA: Diagnosis not present

## 2021-04-21 DIAGNOSIS — R41842 Visuospatial deficit: Secondary | ICD-10-CM | POA: Diagnosis not present

## 2021-04-21 DIAGNOSIS — R41840 Attention and concentration deficit: Secondary | ICD-10-CM | POA: Diagnosis not present

## 2021-04-21 DIAGNOSIS — R2681 Unsteadiness on feet: Secondary | ICD-10-CM

## 2021-04-21 DIAGNOSIS — R41841 Cognitive communication deficit: Secondary | ICD-10-CM | POA: Diagnosis not present

## 2021-04-21 DIAGNOSIS — I6350 Cerebral infarction due to unspecified occlusion or stenosis of unspecified cerebral artery: Secondary | ICD-10-CM | POA: Diagnosis not present

## 2021-04-21 DIAGNOSIS — R471 Dysarthria and anarthria: Secondary | ICD-10-CM | POA: Diagnosis not present

## 2021-04-21 NOTE — Therapy (Signed)
[facility] [address] [place], [place], [address] Phone: [phone]   Fax:  [phone]  Occupational Therapy Treatment  Patient Details  Name: [name] MRN: [id number] Date of Birth: Oct 27, 1930 No data recorded  Encounter Date: 04/21/2021   OT End of Session - 04/21/21 1608     Visit Number 2    Number of Visits 25    Date for OT Re-Evaluation 07/08/21    Authorization Type [employer]    Authorization - Visit Number 2    Progress Note Due on Visit 10    OT Start Time 1400    OT Stop Time 0712    OT Time Calculation (min) 45 min    Activity Tolerance Patient tolerated treatment well    Behavior During Therapy [facility] for tasks assessed/performed             Past Medical History:  Diagnosis Date   Adenocarcinoma of left lung, stage 1 ([place]) 2001   T1N0 stage I  adenoca left lung resected 01/03/00    Alzheimer's disease ([place])    CAD (coronary artery disease) 2014   a. 09/2012 Cath: LM nl, LAD 50-60p, D1 60-70 m, D1 50ost, LCX nl, RCA min irregs, EF 55-65%.   Generalized anxiety disorder 01/08/2007   Qualifier: Diagnosis of  By: Diona Browner MD, [name]     History of colonic polyps 07/25/2011   Macular degeneration of both eyes 01/21/2014   Nonmelanoma skin cancer 07/25/2011   Multiple lesions excised face/nose   Pericarditis    a. 09/2012 with effusion and tamponade, s/p window.  b.  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 SCC IN SITU TX CX3   SCC (squamous cell carcinoma) 04/12/2005   RIGHT NECK SUP. SCC IN SITU TX EXC   SCC (squamous cell carcinoma) 04/16/2013   LEFT TEMPLE SCC IN SITU TX CX3 5FU   SCC (squamous cell carcinoma) 04/16/2013   BELOW LEFT EYE SCC IN SITU TX CX3 5FU   SCC (squamous cell carcinoma) 04/16/2013   BELOW RIGHT EYE SCC IN SITU TX CX3 5FU   SCC (squamous cell carcinoma) 08/04/2014   LEFT CHEEK SCC IN SITU TX CX3 5FU   SCC (squamous cell carcinoma) 11/27/2018   LEFT TEMPLE SCC IN SITU TX WITH BX   SCC (squamous cell carcinoma)    SCC (squamous cell carcinoma)    SCC (squamous cell carcinoma)    SCC (squamous cell carcinoma) Well Diff 09/13/2016   Tip of Nose SCC WELL DIFF TX (MOH's), and RIGHT INNER EYE SUP. SCC IN SITU TX TO WATCH   SCC (squamous cell carcinoma) Well Diff 05/30/2017   Right Cheekbone (Cx3,5FU) and Under Left Eye (Cx3,5FU)  Squamous cell carcinoma in situ (SCCIS) 04/12/2005   Right Neck Inf (Cx3), Right Neck Sup (Exc), and Bulb of Nose (Cx3,Exc)   Squamous cell carcinoma in situ (SCCIS) 09/27/2005   Nose (Cx3,Exc)   Squamous cell carcinoma in situ (SCCIS) 02/18/2013   Below Left Eye (tx p bx)   Squamous cell carcinoma in situ (SCCIS) 04/16/2013   Left Temple (Cx3,5FU), Below Left Eye (Cx3,5FU), Below Right Eye (Cx3,5FU)   Squamous cell carcinoma in situ (SCCIS) 08/04/2014   Left Cheek (Cx3,5FU)   Squamous cell carcinoma in situ (SCCIS) 09/13/2016   Right Inner Eye Sup. 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   Type 2 diabetes mellitus  with vascular disease ([place]) 05/21/2007   Qualifier: Diagnosis of  By: Diona Browner MD, [name]     Type 2 DM with CKD stage 3 and hypertension ([place]) 07/16/2015   Unspecified essential hypertension     Past Surgical History:  Procedure Laterality Date   HERNIA REPAIR     LEFT HEART CATHETERIZATION WITH CORONARY ANGIOGRAM N/A 09/13/2012   Procedure: LEFT HEART CATHETERIZATION WITH CORONARY ANGIOGRAM;  Surgeon: Sherren Mocha, MD;  Location: [facility] CATH LAB;  Service: Cardiovascular;  Laterality: N/A;   LOBECTOMY  2001   upper left   PERICARDIAL TAP N/A 09/16/2012   Procedure: PERICARDIAL TAP;  Surgeon: Sinclair Grooms, MD;  Location: [facility] CATH LAB;  Service: Cardiovascular;  Laterality: N/A;   PROSTATECTOMY  2003   SUBXYPHOID PERICARDIAL WINDOW N/A 09/16/2012   Procedure: SUBXYPHOID PERICARDIAL WINDOW;  Surgeon: Rexene Alberts, MD;  Location: [facility];  Service: Thoracic;  Laterality: N/A;   TONSILLECTOMY      Vitals:   04/21/21 1403  SpO2: 96%     Subjective Assessment - 04/21/21 1403     Subjective  I had a stroke    Pertinent History Pt will verbalize understanding of adapted strategies to maximize safety and I with ADLs/ IADLs .    Limitations Pt is an 88 year old man admitted on 03/07/21 with L side weakness. MRI + for small foci ischemia R pons and L frontal periventricular white matter. PMH. Alzheimers dementia, DM, HTN, stage 3 CKD, lung ca, prostate ca, multiple skin cancers, CAD, anxiety, macular degeneration. Pt lives with wife who has hired caregiver, children currently Artist by [name].    Patient Stated Goals get left  arm working, increase independence    Currently in Pain? No/denies    Pain Score 0-No pain                          OT Treatments/Exercises (OP) - 04/21/21 0001       ADLs   ADL Comments This is patient's first treatment session.  Reviewed short and long term goals.  Patient in agreement, and eager for imporoved functional  performance.      Neurological Re-education Exercises   Other Exercises 1 Supine to address potential for Home Exercise Program for LUE.  Patient able to complete 10 reps of chest press bilaterally, although due to prior lung CA, becomes short of breath with mst activity - recovers  with short rest breaks.  Patient needed assistance for alignment and speed of exercise - and is motivated for improvement.  Patient could benefit from additional practice before patient completes HEP at home.    Other Exercises 2 Patient had difficulty transitioning from supine to sitting at edge of mat.  Patient hesitant to roll onto left side, then needed mod assist to sit upright.  Broke this task down - to allow patient to roll left first, then use left arm to push self up from sidelying to sit.  Patient understood the activity and the potential benefit to LUE to involve in this transition.  With additional practice able to get up without assistance.                    OT Education - 04/21/21 1608     Education Details Initiaited potential supine BUE exercise for shoulder / elbow - have not yet implemented for home    Person(s) Educated Patient    Methods Explanation;Demonstration;Tactile cues;Verbal cues    Comprehension Need further instruction              OT Short Term Goals - 04/21/21 1611       OT SHORT TERM GOAL #1   Title I with inital HEP.    Time 4    Period Weeks    Status On-going    Target Date 05/13/21      OT SHORT TERM GOAL #2   Title Pt will donn shirt with set up and supervision and pants with min A.    Time 4    Period Weeks    Status On-going      OT SHORT TERM GOAL #3   Title Pt will demonstrate 90* shoulder flexion in prep for functional reach with LUE with no more than min compensations.    Time 4    Period Weeks    Status On-going      OT SHORT TERM GOAL #4   Title Pt will verbalize understanding of memory compensation strategies.    Time 4    Period Weeks     Status On-going      OT SHORT TERM GOAL #5   Title Pt will demonstrate improved LUE functional use as evidenced by increasing box/ blocks score by 5 blocks    Baseline R 43 blocks, LUE 28 blocks    Time 4    Period Weeks    Status On-going      OT SHORT TERM GOAL #6   Title Pt will verbalize understanding of adapted strategies to maximize safety and I with ADLs/ IADLs .    (WG:NFAOZHY food)    Time 4    Period Weeks    Status On-going               OT Long Term Goals - 04/21/21 1611       OT LONG TERM GOAL #1   Title Pt will perform all basic ADLS with supervision.    Time 12    Period Weeks    Status On-going    Target Date 07/08/21      OT LONG TERM GOAL #2   Title Pt will demonstrate ability to retrieve a lightweight object at 100* with LUE    Time 12    Period Weeks    Status On-going      OT LONG TERM GOAL #3   Title Pt will demonstrate improved fine motor coordination for ADLs  as evidenced by placing 9 pegs in 90 secs or less for 9 hole peg test.    Time 12    Period Weeks    Status On-going      OT LONG TERM GOAL #4   Title Pt will increase LUE grip strength by 5 lbs for increased LUE functional use.    Baseline RUE 65.9 LUE 33.7    Time 12    Period Weeks    Status On-going      OT LONG TERM GOAL #5   Title Pt will perform simple beverage prep with supervision.    Time 12    Period Weeks    Status On-going                   Plan - 04/21/21 1609     Clinical Impression Statement Pt is eager for improvement in his ADL ability and functional use of LUE/balance.  Patient's wife recently had shoulder replacement - so patient and wife are receiving intermittent assistance from friend.    OT Occupational Profile and History Detailed Assessment- Review of Records and additional review of physical, cognitive, psychosocial history related to current functional performance    Occupational performance deficits (Please refer to evaluation for  details): ADL's;IADL's;Rest and Sleep;Leisure;Social Participation    Body Structure / Function / Physical Skills ADL;UE functional use;Endurance;Balance;Flexibility;Pain;Vision;FMC;Gait;ROM;GMC;Coordination;Decreased knowledge of precautions;Decreased knowledge of use of DME;IADL;Strength;Dexterity;Mobility    Rehab Potential Good    Clinical Decision Making Several treatment options, min-mod task modification necessary   visual and cognitive deficits, decrease coordiantion   Comorbidities Affecting Occupational Performance: May have comorbidities impacting occupational performance   Alzheimer's, macular degeneration, CVA   Modification or Assistance to Complete Evaluation  Min-Moderate modification of tasks or assist with assess necessary to complete eval   visual deficits, extra cueing and  assist required   OT Frequency 2x / week    OT Duration 12 weeks    OT Treatment/Interventions Self-care/ADL training;Ultrasound;Visual/perceptual remediation/compensation;Patient/family education;DME and/or AE instruction;Aquatic Therapy;Paraffin;Passive range of motion;Balance training;Gait Training;Stair Training;Fluidtherapy;Cryotherapy;Electrical Stimulation;Therapist, nutritional;Therapeutic activities;Manual Therapy;Therapeutic exercise;Splinting;Moist Heat;Neuromuscular education;Cognitive remediation/compensation    Plan initiate HEP for LUE, supine closed cahin, weightbearing, light functional use LUE    Consulted and Agree with Plan of Care Patient;Family member/caregiver             Patient will benefit from skilled therapeutic intervention in order to improve the following deficits and impairments:   Body Structure / Function / Physical Skills: ADL, UE functional use, Endurance, Balance, Flexibility, Pain, Vision, FMC, Gait, ROM, GMC, Coordination, Decreased knowledge of precautions, Decreased knowledge of use of DME, IADL, Strength, Dexterity, Mobility       Visit  Diagnosis: Hemiplegia and hemiparesis following cerebral infarction affecting left non-dominant side (HCC)  Muscle weakness (generalized)  Unsteadiness on feet  Other lack of coordination  Attention and concentration deficit  Visuospatial deficit    Problem List Patient Active Problem List   Diagnosis Date Noted   Right pontine cerebrovascular accident ([place]) 03/12/2021   Acute ischemic stroke ([place]) 03/08/2021   Hyperkalemia 03/08/2021   Hypomagnesemia 03/08/2021   GERD (gastroesophageal reflux disease) 03/08/2021   Acute CVA (cerebrovascular accident) ([place]) 03/08/2021   COVID-19 01/01/2021   Sensorineural hearing loss (SNHL) of both ears 05/15/2017   Tinnitus, bilateral 05/15/2017   Cough 06/08/2015   Macular degeneration of both eyes 01/21/2014   Tobacco abuse, 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/08/2013   CAD (coronary artery disease)    Pericardial tamponade 09/17/2012   Acute pericarditis,  unspecified 09/13/2012   Adenocarcinoma of left lung, stage 1 ([place]) 07/25/2011   Cancer of prostate w/med recur risk (T2b-c or Gleason 7 or PSA 10-20) ([place]) 07/25/2011   Nonmelanoma skin cancer 07/25/2011   History of colonic polyps 07/25/2011   Type 2 diabetes mellitus with vascular disease ([place]) 05/21/2007   HYPERCHOLESTEROLEMIA 05/21/2007   CARCINOMA, SKIN, SQUAMOUS CELL 01/08/2007   Generalized anxiety disorder 01/08/2007   ALZHEIMER'S DISEASE, MILD 01/08/2007   Essential hypertension 01/08/2007    Mariah Milling, OT 04/21/2021, 4:12 PM  [facility] [address] [place], [place], [address] Phone: [phone]   Fax:  [phone]  Name: [name] MRN: [id number] Date of Birth: 1931/03/12

## 2021-04-21 NOTE — Therapy (Signed)
[facility] [address] [place], [place], [address] Phone: [phone]   Fax:  [phone]  Physical Therapy Treatment  Patient Details  Name: [name] MRN: [id number] Date of Birth: 09-21-1930 Referring Provider (PT): Cathlyn Parsons, PA-C   Encounter Date: 04/21/2021   PT End of Session - 04/21/21 1449     Visit Number 2    Number of Visits 16    Date for PT Re-Evaluation 06/09/21    Authorization Type [employer]    PT Start Time 1445    PT Stop Time 4097    PT Time Calculation (min) 45 min    Equipment Utilized During Treatment Gait belt    Activity Tolerance Patient tolerated treatment well    Behavior During Therapy [facility] for tasks assessed/performed             Past Medical History:  Diagnosis Date   Adenocarcinoma of left lung, stage 1 ([place]) 2001   T1N0 stage I  adenoca left lung resected 01/03/00    Alzheimer's disease ([place])    CAD (coronary artery disease) 2014   a. 09/2012 Cath: LM nl, LAD 50-60p, D1 60-70 m, D1 50ost, LCX nl, RCA min irregs, EF 55-65%.   Generalized anxiety disorder 01/08/2007   Qualifier: Diagnosis of  By: Diona Browner MD, [name]     History of colonic polyps 07/25/2011   Macular degeneration of both eyes 01/21/2014   Nonmelanoma skin cancer 07/25/2011   Multiple lesions excised face/nose   Pericarditis    a. 09/2012 with effusion and tamponade, s/p window.  b.  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 SCC IN SITU TX CX3   SCC (squamous cell carcinoma) 04/12/2005   RIGHT NECK SUP. SCC IN SITU TX EXC   SCC (squamous cell carcinoma) 04/16/2013   LEFT TEMPLE SCC IN SITU TX CX3 5FU   SCC (squamous cell carcinoma) 04/16/2013   BELOW LEFT EYE SCC IN SITU TX CX3 5FU   SCC (squamous cell carcinoma) 04/16/2013   BELOW RIGHT EYE SCC IN SITU TX CX3 5FU   SCC (squamous cell carcinoma) 08/04/2014   LEFT CHEEK SCC IN SITU TX CX3 5FU   SCC (squamous cell carcinoma) 11/27/2018   LEFT TEMPLE SCC IN SITU TX WITH BX   SCC (squamous cell carcinoma)    SCC (squamous cell carcinoma)    SCC (squamous cell carcinoma)    SCC (squamous cell carcinoma) Well Diff 09/13/2016   Tip of Nose SCC WELL DIFF TX (MOH's), and RIGHT INNER EYE SUP. SCC IN SITU TX TO WATCH   SCC (squamous cell carcinoma) Well Diff 05/30/2017   Right Cheekbone (Cx3,5FU) and Under Left Eye (Cx3,5FU)   Squamous cell carcinoma  in situ (SCCIS) 04/12/2005   Right Neck Inf (Cx3), Right Neck Sup (Exc), and Bulb of Nose (Cx3,Exc)   Squamous cell carcinoma in situ (SCCIS) 09/27/2005   Nose (Cx3,Exc)   Squamous cell carcinoma in situ (SCCIS) 02/18/2013   Below Left Eye (tx p bx)   Squamous cell carcinoma in situ (SCCIS) 04/16/2013   Left Temple (Cx3,5FU), Below Left Eye (Cx3,5FU), Below Right Eye (Cx3,5FU)   Squamous cell carcinoma in situ (SCCIS) 08/04/2014   Left Cheek (Cx3,5FU)   Squamous cell carcinoma in situ (SCCIS) 09/13/2016   Right Inner Eye Sup. 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   Type 2 diabetes  mellitus with vascular disease ([place]) 05/21/2007   Qualifier: Diagnosis of  By: Diona Browner MD, [name]     Type 2 DM with CKD stage 3 and hypertension ([place]) 07/16/2015   Unspecified essential hypertension     Past Surgical History:  Procedure Laterality Date   HERNIA REPAIR     LEFT HEART CATHETERIZATION WITH CORONARY ANGIOGRAM N/A 09/13/2012   Procedure: LEFT HEART CATHETERIZATION WITH CORONARY ANGIOGRAM;  Surgeon: Sherren Mocha, MD;  Location: [facility] CATH LAB;  Service: Cardiovascular;  Laterality: N/A;   LOBECTOMY  2001   upper left   PERICARDIAL TAP N/A 09/16/2012   Procedure: PERICARDIAL TAP;  Surgeon: Sinclair Grooms, MD;  Location: [facility] CATH LAB;  Service: Cardiovascular;  Laterality: N/A;   PROSTATECTOMY  2003   SUBXYPHOID PERICARDIAL WINDOW N/A 09/16/2012   Procedure: SUBXYPHOID PERICARDIAL WINDOW;  Surgeon: Rexene Alberts, MD;  Location: [facility];  Service: Thoracic;  Laterality: N/A;   TONSILLECTOMY      There were no vitals filed for this visit.   Subjective Assessment - 04/21/21 1450     Subjective Pt reports no falls. Has caregiver at home 24/7. Caregiver holds on to him when he waslking to the bathroom.    Patient is accompained by: Family member    Pertinent History R pontine CVA 03/07/21. History of Alzheimer's dementia maintained on Aricept type 2 diabetes mellitus hypertension CKD stage III macular degeneration adenocarcinoma left lung with upper lobe resection 2001 prostate cancer with prostatectomy    Limitations Walking;Standing;House hold activities    How long can you sit comfortably? n/a    How long can you stand comfortably? As long as holding on to something    How long can you walk comfortably? Mostly in the house    Patient Stated Goals Pt: walk again without RW. Family: pt amb ind to bathroom (currently requires assist for safety)    Currently in Pain? No/denies                 Sit to stand: no HHA from hi Lo mat table (lowest height): 2 x 10 Supine SLR:  2 x  10, pt was reporting SOB with supine exercises and required rest break to recover.SpO2 97%, HR 97 SL hip abduction: 2 x 10 R and L, pt was not SOB while doing SL exercises. Supine hooklying hip adductor stretch: 3 x 15"  Supine bridge: 2 x 10 Supine hooklying bil leg lift with slow eccentric lowering: 2 x 10  Gait training: 2  x 230' with walker, cues to stand up tall and not lean on walker to improve WB through bil LE Adjusted walker height to taller to improve upright posture Patient and daughter educated on working with HEP once a day with caregiver, continue to use walker with focusing on decreased WB with UE, handed daughter HEP                      PT Short Term Goals - 04/14/21 1512       PT SHORT TERM GOAL #1   Title Pt will perform exercises at home at least 3x/wk with supervision as needed    Time 4    Period Weeks    Status New    Target Date 05/12/21      PT SHORT TERM GOAL #2   Title Pt will have improved 5x STS to </=13 sec    Baseline 15.28 sec on eval    Time 4    Period Weeks    Status New    Target Date 05/12/21      PT SHORT TERM GOAL #3   Title Pt will be mod independent with ambulating to/from bathroom for safer home mobility    Time 4    Period Weeks    Status New    Target Date 05/12/21      PT SHORT TERM GOAL #4   Title Pt will have improved Berg Balance Score to at least 42/56    Baseline 36/56    Time 4    Period Weeks    Status New    Target Date 05/12/21      PT SHORT TERM GOAL #5   Title Pt will demo improved TUG score to </=20 sec for decreased fall risk    Baseline 25.09 sec on eval    Time 4    Period Weeks    Status New    Target Date 05/12/21               PT Long Term Goals - 04/14/21 1515       PT LONG TERM GOAL #1   Title Pt will be independent with maintaining exercises at home    Time 8    Period Weeks    Status New    Target Date 06/09/21      PT LONG TERM GOAL #2   Title Pt will be able to  amb with LRAD mod I x 800' for safe limited community amb    Time 8    Period Weeks    Status New    Target Date 06/09/21      PT LONG TERM GOAL #3   Title Pt will demo improved Berg Balance Score of at least 45/56 for decreased fall risk    Time 8    Period Weeks    Status New    Target Date 06/09/21      PT LONG TERM GOAL #4   Title Pt will have improved DGI to at least 19/24 to demo decreased fall risk    Baseline 13/24    Time 8    Period Weeks    Status New    Target Date 06/09/21      PT LONG TERM GOAL #5   Title Pt will have improved FOTO score of 65    Baseline 49    Time 8    Period Weeks    Status New    Target Date  06/09/21                   Plan - 04/21/21 1507     Clinical Impression Statement Today's skilled session was focused on functional strengthening and working on HEP. Also noted that patient has more SOB when performing exercises in supine position compared to sidelying, sitting or standing.    Personal Factors and Comorbidities Age;Fitness;Time since onset of injury/illness/exacerbation    Examination-Activity Limitations Locomotion Level;Bathing;Squat;Stairs;Stand;Toileting;Transfers;Bed Mobility;Caring for Others    Examination-Participation Restrictions Meal Prep;Cleaning;Community Activity;Laundry;Shop    Stability/Clinical Decision Making Evolving/Moderate complexity    Rehab Potential Good    PT Frequency 2x / week    PT Duration 8 weeks    PT Treatment/Interventions ADLs/Self Care Home Management;Aquatic Therapy;Electrical Stimulation;Moist Heat;DME Instruction;Gait training;Stair training;Functional mobility training;Therapeutic activities;Therapeutic exercise;Balance training;Neuromuscular re-education;Patient/family education;Orthotic Fit/Training;Manual techniques;Passive range of motion;Dry needling;Taping    PT Next Visit Plan Initiate strengthening and balance program for home. Work on gait and LE coordination. Focus on weight  shifting    PT Home Exercise Plan Access Code [email or web address]    [email or web address] and Agree with Plan of Care Patient;Family member/caregiver             Patient will benefit from skilled therapeutic intervention in order to improve the following deficits and impairments:  Abnormal gait, Decreased coordination, Impaired tone, Decreased endurance, Impaired UE functional use, Decreased activity tolerance, Decreased balance, Improper body mechanics, Decreased mobility, Decreased strength, Postural dysfunction  Visit Diagnosis: Muscle weakness (generalized)  Unsteadiness on feet  Other abnormalities of gait and mobility  Difficulty in walking, not elsewhere classified     Problem List Patient Active Problem List   Diagnosis Date Noted   Right pontine cerebrovascular accident ([place]) 03/12/2021   Acute ischemic stroke ([place]) 03/08/2021   Hyperkalemia 03/08/2021   Hypomagnesemia 03/08/2021   GERD (gastroesophageal reflux disease) 03/08/2021   Acute CVA (cerebrovascular accident) ([place]) 03/08/2021   COVID-19 01/01/2021   Sensorineural hearing loss (SNHL) of both ears 05/15/2017   Tinnitus, bilateral 05/15/2017   Cough 06/08/2015   Macular degeneration of both eyes 01/21/2014   Tobacco abuse, 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/08/2013   CAD (coronary artery disease)    Pericardial tamponade 09/17/2012   Acute pericarditis, unspecified 09/13/2012   Adenocarcinoma of left lung, stage 1 ([place]) 07/25/2011   Cancer of prostate w/med recur risk (T2b-c or Gleason 7 or PSA 10-20) ([place]) 07/25/2011   Nonmelanoma skin cancer 07/25/2011   History of colonic polyps 07/25/2011   Type 2 diabetes mellitus with vascular disease ([place]) 05/21/2007   HYPERCHOLESTEROLEMIA 05/21/2007   CARCINOMA, SKIN, SQUAMOUS CELL 01/08/2007   Generalized anxiety disorder 01/08/2007   ALZHEIMER'S DISEASE, MILD 01/08/2007   Essential hypertension 01/08/2007    Kerrie Pleasure, PT 04/21/2021, 3:46 PM  [facility] [address] [place], [place], [address] Phone: [phone]   Fax:  [phone]  Name: [name] MRN: [id number] Date of Birth: Dec 17, 1930

## 2021-04-22 ENCOUNTER — Other Ambulatory Visit: Payer: Self-pay

## 2021-04-22 NOTE — Telephone Encounter (Signed)
Patient needs new prescriptions for maintenance meds. Last prescribed for 30 day by hospitalist 03/31/21. Please send to [facility] if appropriate:  Atorvastatin 20 mg Vitamin B12 1000 mcg Sertraline Ferrous sulfate Pantoprazole Tamsulosin

## 2021-04-23 ENCOUNTER — Other Ambulatory Visit: Payer: Self-pay

## 2021-04-23 ENCOUNTER — Ambulatory Visit: Payer: Medicare HMO

## 2021-04-23 ENCOUNTER — Ambulatory Visit: Payer: Medicare HMO | Admitting: Occupational Therapy

## 2021-04-23 DIAGNOSIS — R41840 Attention and concentration deficit: Secondary | ICD-10-CM | POA: Diagnosis not present

## 2021-04-23 DIAGNOSIS — M6281 Muscle weakness (generalized): Secondary | ICD-10-CM | POA: Diagnosis not present

## 2021-04-23 DIAGNOSIS — I69354 Hemiplegia and hemiparesis following cerebral infarction affecting left non-dominant side: Secondary | ICD-10-CM | POA: Diagnosis not present

## 2021-04-23 DIAGNOSIS — R2681 Unsteadiness on feet: Secondary | ICD-10-CM | POA: Diagnosis not present

## 2021-04-23 DIAGNOSIS — R41841 Cognitive communication deficit: Secondary | ICD-10-CM | POA: Diagnosis not present

## 2021-04-23 DIAGNOSIS — R41842 Visuospatial deficit: Secondary | ICD-10-CM

## 2021-04-23 DIAGNOSIS — R2689 Other abnormalities of gait and mobility: Secondary | ICD-10-CM | POA: Diagnosis not present

## 2021-04-23 DIAGNOSIS — R262 Difficulty in walking, not elsewhere classified: Secondary | ICD-10-CM

## 2021-04-23 DIAGNOSIS — R278 Other lack of coordination: Secondary | ICD-10-CM | POA: Diagnosis not present

## 2021-04-23 DIAGNOSIS — I6350 Cerebral infarction due to unspecified occlusion or stenosis of unspecified cerebral artery: Secondary | ICD-10-CM | POA: Diagnosis not present

## 2021-04-23 DIAGNOSIS — R471 Dysarthria and anarthria: Secondary | ICD-10-CM | POA: Diagnosis not present

## 2021-04-23 MED ORDER — SERTRALINE HCL 100 MG PO TABS: 100.0000 mg | ORAL_TABLET | Freq: Every day | ORAL | 3 refills | Status: DC

## 2021-04-23 MED ORDER — ATORVASTATIN CALCIUM 20 MG PO TABS: 20.0000 mg | ORAL_TABLET | Freq: Every day | ORAL | 3 refills | Status: DC

## 2021-04-23 MED ORDER — PANTOPRAZOLE SODIUM 40 MG PO TBEC: 40.0000 mg | DELAYED_RELEASE_TABLET | Freq: Every day | ORAL | 3 refills | Status: DC

## 2021-04-23 MED ORDER — VITAMIN B-12 1000 MCG PO TABS: 1000.0000 ug | ORAL_TABLET | Freq: Every day | ORAL | 3 refills | Status: DC

## 2021-04-23 MED ORDER — TAMSULOSIN HCL 0.4 MG PO CAPS: 0.4000 mg | ORAL_CAPSULE | Freq: Every day | ORAL | 3 refills | Status: DC

## 2021-04-23 MED ORDER — FERROUS SULFATE 325 (65 FE) MG PO TABS: 325.0000 mg | ORAL_TABLET | Freq: Every day | ORAL | 3 refills | Status: DC

## 2021-04-23 NOTE — Telephone Encounter (Signed)
Butch Penny, I have the prescriptions ready, but can you see which pharmacy they want to use and send them there?  Thanks  (Thanks [facility], I took care of them this AM)

## 2021-04-23 NOTE — Patient Instructions (Addendum)
Shoulder: Flexion (Supine)    With hands shoulder width apart,  hold a paper towel roll between your hands slowly lower dowel to eye level Do not let elbows bend. Keep back flat. Hold _5___ seconds. Repeat ___10_ times. Do ___1_ sessions per day. Repeat performing chest press.  AROM: Wrist Extension   .  With __left __ palm down, bend wrist up. Repeat __15__ times per set.  Do _2__ sessions per day.     AROM: Forearm Pronation / Supination   With __left__ arm in handshake position, slowly rotate palm down until stretch is felt. Relax. Then rotate palm up until stretch is felt. Repeat _15___ times per set. Do _2__ sessions per day.  Copyright  VHI. All rights reserved.      Flip playing cards with your left hand, turn over 1/2 the deck with your left hand.  Copyright  VHI. All rights reserved.

## 2021-04-23 NOTE — Therapy (Signed)
[facility] [address] [place], [place], [address] Phone: [phone]   Fax:  [phone]  Physical Therapy Treatment  Patient Details  Name: [name] MRN: [id number] Date of Birth: November 06, 1930 Referring Provider (PT): Cathlyn Parsons, PA-C   Encounter Date: 04/23/2021   PT End of Session - 04/23/21 1105     Visit Number 3    Number of Visits 16    Date for PT Re-Evaluation 06/09/21    Authorization Type Aetna Medicare    PT Start Time 1100    PT Stop Time 2376    PT Time Calculation (min) 45 min    Equipment Utilized During Treatment Gait belt    Activity Tolerance Patient tolerated treatment well    Behavior During Therapy [facility] for tasks assessed/performed             Past Medical History:  Diagnosis Date   Adenocarcinoma of left lung, stage 1 ([place]) 2001   T1N0 stage I  adenoca left lung resected 01/03/00    Alzheimer's disease ([place])    CAD (coronary artery disease) 2014   a. 09/2012 Cath: LM nl, LAD 50-60p, D1 60-70 m, D1 50ost, LCX nl, RCA min irregs, EF 55-65%.   Generalized anxiety disorder 01/08/2007   Qualifier: Diagnosis of  By: Diona Browner MD, [name]     History of colonic polyps 07/25/2011   Macular degeneration of both eyes 01/21/2014   Nonmelanoma skin cancer 07/25/2011   Multiple lesions excised face/nose   Pericarditis    a. 09/2012 with effusion and tamponade, s/p window.  b.  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 SCC IN SITU TX CX3   SCC (squamous cell carcinoma) 04/12/2005   RIGHT NECK SUP. SCC IN SITU TX EXC   SCC (squamous cell carcinoma) 04/16/2013   LEFT TEMPLE SCC IN SITU TX CX3 5FU   SCC (squamous cell carcinoma) 04/16/2013   BELOW LEFT EYE SCC IN SITU TX CX3 5FU   SCC (squamous cell carcinoma) 04/16/2013   BELOW RIGHT EYE SCC IN SITU TX CX3 5FU   SCC (squamous cell carcinoma) 08/04/2014   LEFT CHEEK SCC IN SITU TX CX3 5FU   SCC (squamous cell carcinoma) 11/27/2018   LEFT TEMPLE SCC IN SITU TX WITH BX   SCC (squamous cell carcinoma)    SCC (squamous cell carcinoma)    SCC (squamous cell carcinoma)    SCC (squamous cell carcinoma) Well Diff 09/13/2016   Tip of Nose SCC WELL DIFF TX (MOH's), and RIGHT INNER EYE SUP. SCC IN SITU TX TO WATCH   SCC (squamous cell carcinoma) Well Diff 05/30/2017   Right Cheekbone (Cx3,5FU) and Under Left Eye (Cx3,5FU)   Squamous cell carcinoma  in situ (SCCIS) 04/12/2005   Right Neck Inf (Cx3), Right Neck Sup (Exc), and Bulb of Nose (Cx3,Exc)   Squamous cell carcinoma in situ (SCCIS) 09/27/2005   Nose (Cx3,Exc)   Squamous cell carcinoma in situ (SCCIS) 02/18/2013   Below Left Eye (tx p bx)   Squamous cell carcinoma in situ (SCCIS) 04/16/2013   Left Temple (Cx3,5FU), Below Left Eye (Cx3,5FU), Below Right Eye (Cx3,5FU)   Squamous cell carcinoma in situ (SCCIS) 08/04/2014   Left Cheek (Cx3,5FU)   Squamous cell carcinoma in situ (SCCIS) 09/13/2016   Right Inner Eye Sup. 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   Type 2 diabetes  mellitus with vascular disease ([place]) 05/21/2007   Qualifier: Diagnosis of  By: Diona Browner MD, [name]     Type 2 DM with CKD stage 3 and hypertension ([place]) 07/16/2015   Unspecified essential hypertension     Past Surgical History:  Procedure Laterality Date   HERNIA REPAIR     LEFT HEART CATHETERIZATION WITH CORONARY ANGIOGRAM N/A 09/13/2012   Procedure: LEFT HEART CATHETERIZATION WITH CORONARY ANGIOGRAM;  Surgeon: Sherren Mocha, MD;  Location: [facility] CATH LAB;  Service: Cardiovascular;  Laterality: N/A;   LOBECTOMY  2001   upper left   PERICARDIAL TAP N/A 09/16/2012   Procedure: PERICARDIAL TAP;  Surgeon: Sinclair Grooms, MD;  Location: [facility] CATH LAB;  Service: Cardiovascular;  Laterality: N/A;   PROSTATECTOMY  2003   SUBXYPHOID PERICARDIAL WINDOW N/A 09/16/2012   Procedure: SUBXYPHOID PERICARDIAL WINDOW;  Surgeon: Rexene Alberts, MD;  Location: [facility];  Service: Thoracic;  Laterality: N/A;   TONSILLECTOMY      There were no vitals filed for this visit.   Subjective Assessment - 04/23/21 1105     Subjective Pt reports no significant changes at home, no falls.    Patient is accompained by: Family member    Pertinent History R pontine CVA 03/07/21. History of Alzheimer's dementia maintained on Aricept type 2 diabetes mellitus hypertension CKD stage III macular degeneration adenocarcinoma left lung with upper lobe resection 2001 prostate cancer with prostatectomy    Limitations Walking;Standing;House hold activities    How long can you sit comfortably? n/a    How long can you stand comfortably? As long as holding on to something    How long can you walk comfortably? Mostly in the house    Patient Stated Goals Pt: walk again without RW. Family: pt amb ind to bathroom (currently requires assist for safety)    Currently in Pain? No/denies                     Sit to stand: no HHA from hi Lo mat table (lowest height): 2 x 10, 10lb weight in bil hands (needed external support to prevent  weight from slipping) Supine SLR:  2 x 10, 2lbs on ankles  SL hip abduction: 2 x 10 , 2lbs on ankles.R and L, pt was not SOB while doing SL exercises. Supine hooklying hip adductor stretch: 3 x 15"  Supine bridge: 2 x 10 Supine hooklying bil leg lift with slow eccentric lowering: 2 x 10. 2lbs at ankles R sidelying: manually resisted  knee to chest and leg extensions: slow reversals to improve hip knee coordination: 2 x 10 L only Standing box taps: 4" box with L UE on walker in front, cues to not let the left foot drag when lifting it off or tapping it, tactile cues at hips provided to improve shift on L LE during R  hip flexion and to prevent trunk collapse: 2 x 10 R and L Walking fwd and bwd with walker: 3 x 20 feet each                             PT Short Term Goals - 04/14/21 1512       PT SHORT TERM GOAL #1   Title Pt will perform exercises at home at least 3x/wk with supervision as needed    Time 4    Period Weeks    Status New    Target Date 05/12/21      PT SHORT TERM GOAL #2   Title Pt will have improved 5x STS to </=13 sec    Baseline 15.28 sec on eval    Time 4    Period Weeks    Status New    Target Date 05/12/21      PT SHORT TERM GOAL #3   Title Pt will be mod independent with ambulating to/from bathroom for safer home mobility    Time 4    Period Weeks    Status New    Target Date 05/12/21      PT SHORT TERM GOAL #4   Title Pt will have improved Berg Balance Score to at least 42/56    Baseline 36/56    Time 4    Period Weeks    Status New    Target Date 05/12/21      PT SHORT TERM GOAL #5   Title Pt will demo improved TUG score to </=20 sec for decreased fall risk    Baseline 25.09 sec on eval    Time 4    Period Weeks    Status New    Target Date 05/12/21               PT Long Term Goals - 04/14/21 1515       PT LONG TERM GOAL #1   Title Pt will be independent with maintaining exercises at home    Time 8     Period Weeks    Status New    Target Date 06/09/21      PT LONG TERM GOAL #2   Title Pt will be able to amb with LRAD mod I x 800' for safe limited community amb    Time 8    Period Weeks    Status New    Target Date 06/09/21      PT LONG TERM GOAL #3   Title Pt will demo improved Berg Balance Score of at least 45/56 for decreased fall risk    Time 8    Period Weeks    Status New    Target Date 06/09/21      PT LONG TERM GOAL #4   Title Pt will have improved DGI to at least 19/24 to demo decreased fall risk    Baseline 13/24    Time 8    Period Weeks    Status New    Target Date 06/09/21      PT LONG TERM GOAL #5  Title Pt will have improved FOTO score of 65    Baseline 49    Time 8    Period Weeks    Status New    Target Date 06/09/21                   Plan - 04/23/21 1109     Clinical Impression Statement Progressed basic hip and core strengthenign exercises from last session. Pt was less SOB in supine position compared to last session.    Personal Factors and Comorbidities Age;Fitness;Time since onset of injury/illness/exacerbation    Examination-Activity Limitations Locomotion Level;Bathing;Squat;Stairs;Stand;Toileting;Transfers;Bed Mobility;Caring for Others    Examination-Participation Restrictions Meal Prep;Cleaning;Community Activity;Laundry;Shop    Stability/Clinical Decision Making Evolving/Moderate complexity    Rehab Potential Good    PT Frequency 2x / week    PT Duration 8 weeks    PT Treatment/Interventions ADLs/Self Care Home Management;Aquatic Therapy;Electrical Stimulation;Moist Heat;DME Instruction;Gait training;Stair training;Functional mobility training;Therapeutic activities;Therapeutic exercise;Balance training;Neuromuscular re-education;Patient/family education;Orthotic Fit/Training;Manual techniques;Passive range of motion;Dry needling;Taping    PT Next Visit Plan work on hip core strengtheing, standing balance, gait    PT Home  Exercise Plan Access Code [email or web address]    Consulted and Agree with Plan of Care Patient;Family member/caregiver             Patient will benefit from skilled therapeutic intervention in order to improve the following deficits and impairments:  Abnormal gait, Decreased coordination, Impaired tone, Decreased endurance, Impaired UE functional use, Decreased activity tolerance, Decreased balance, Improper body mechanics, Decreased mobility, Decreased strength, Postural dysfunction  Visit Diagnosis: Hemiplegia and hemiparesis following cerebral infarction affecting left non-dominant side (HCC)  Muscle weakness (generalized)  Other abnormalities of gait and mobility  Difficulty in walking, not elsewhere classified     Problem List Patient Active Problem List   Diagnosis Date Noted   Right pontine cerebrovascular accident ([place]) 03/12/2021   Acute ischemic stroke ([place]) 03/08/2021   Hyperkalemia 03/08/2021   Hypomagnesemia 03/08/2021   GERD (gastroesophageal reflux disease) 03/08/2021   Acute CVA (cerebrovascular accident) ([place]) 03/08/2021   COVID-19 01/01/2021   Sensorineural hearing loss (SNHL) of both ears 05/15/2017   Tinnitus, bilateral 05/15/2017   Cough 06/08/2015   Macular degeneration of both eyes 01/21/2014   Tobacco abuse, 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/08/2013   CAD (coronary artery disease)    Pericardial tamponade 09/17/2012   Acute pericarditis, unspecified 09/13/2012   Adenocarcinoma of left lung, stage 1 ([place]) 07/25/2011   Cancer of prostate w/med recur risk (T2b-c or Gleason 7 or PSA 10-20) ([place]) 07/25/2011   Nonmelanoma skin cancer 07/25/2011   History of colonic polyps 07/25/2011   Type 2 diabetes mellitus with vascular disease ([place]) 05/21/2007   HYPERCHOLESTEROLEMIA 05/21/2007   CARCINOMA, SKIN, SQUAMOUS CELL 01/08/2007   Generalized anxiety disorder 01/08/2007   ALZHEIMER'S DISEASE, MILD 01/08/2007   Essential hypertension 01/08/2007    Kerrie Pleasure, PT 04/23/2021,  11:44 AM  [facility] [address] [place], [place], [address] Phone: [phone]   Fax:  [phone]  Name: KASEM MOZER MRN: [id number] Date of Birth: 12/18/1930

## 2021-04-23 NOTE — Therapy (Signed)
[facility] [address] [place], [place], [address] Phone: [phone]   Fax:  [phone]  Occupational Therapy Treatment  Patient Details  Name: [name] MRN: [id number] Date of Birth: Nov 15, 1930 No data recorded  Encounter Date: 04/23/2021   OT End of Session - 04/23/21 1339     Visit Number 3    Number of Visits 25    Date for OT Re-Evaluation 07/08/21    Authorization Type Aetna Medicare    Authorization - Visit Number 3    Progress Note Due on Visit 10    OT Start Time 1148    OT Stop Time 1230    OT Time Calculation (min) 42 min             Past Medical History:  Diagnosis Date   Adenocarcinoma of left lung, stage 1 ([place]) 2001   T1N0 stage I  adenoca left lung resected 01/03/00    Alzheimer's disease ([place])    CAD (coronary artery disease) 2014   a. 09/2012 Cath: LM nl, LAD 50-60p, D1 60-70 m, D1 50ost, LCX nl, RCA min irregs, EF 55-65%.   Generalized anxiety disorder 01/08/2007   Qualifier: Diagnosis of  By: Diona Browner MD, [name]     History of colonic polyps 07/25/2011   Macular degeneration of both eyes 01/21/2014   Nonmelanoma skin cancer 07/25/2011   Multiple lesions excised face/nose   Pericarditis    a. 09/2012 with effusion and tamponade, s/p window.  b.  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 SCC IN SITU TX CX3   SCC (squamous cell carcinoma) 04/12/2005   RIGHT NECK SUP. SCC IN SITU TX EXC   SCC (squamous cell carcinoma) 04/16/2013   LEFT TEMPLE SCC IN SITU TX CX3 5FU   SCC (squamous cell carcinoma) 04/16/2013   BELOW LEFT EYE SCC IN SITU TX CX3 5FU   SCC (squamous cell carcinoma) 04/16/2013   BELOW RIGHT EYE SCC IN SITU TX CX3 5FU   SCC (squamous cell carcinoma) 08/04/2014   LEFT CHEEK SCC IN SITU TX CX3 5FU   SCC (squamous cell carcinoma) 11/27/2018   LEFT TEMPLE SCC IN SITU TX WITH BX   SCC (squamous cell carcinoma)    SCC (squamous cell carcinoma)    SCC (squamous cell carcinoma)    SCC (squamous cell carcinoma) Well Diff 09/13/2016   Tip of Nose SCC WELL DIFF TX (MOH's), and RIGHT INNER EYE SUP. 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   Type 2 diabetes mellitus with vascular disease ([place]) 05/21/2007   Qualifier: Diagnosis of  By: Diona Browner MD, [name]     Type 2 DM with CKD stage 3 and  hypertension ([place]) 07/16/2015   Unspecified essential hypertension     Past Surgical History:  Procedure Laterality Date   HERNIA REPAIR     LEFT HEART CATHETERIZATION WITH CORONARY ANGIOGRAM N/A 09/13/2012   Procedure: LEFT HEART CATHETERIZATION WITH CORONARY ANGIOGRAM;  Surgeon: Sherren Mocha, MD;  Location: [facility] CATH LAB;  Service: Cardiovascular;  Laterality: N/A;   LOBECTOMY  2001   upper left   PERICARDIAL TAP N/A 09/16/2012   Procedure: PERICARDIAL TAP;  Surgeon: Sinclair Grooms, MD;  Location: [facility] CATH LAB;  Service: Cardiovascular;  Laterality: N/A;   PROSTATECTOMY  2003   SUBXYPHOID PERICARDIAL WINDOW N/A 09/16/2012   Procedure: SUBXYPHOID PERICARDIAL WINDOW;  Surgeon: Rexene Alberts, MD;  Location: [facility];  Service: Thoracic;  Laterality: N/A;   TONSILLECTOMY      There were no vitals filed for this visit.   Subjective Assessment - 04/23/21 1204     Subjective  Denies pain    Pertinent History Pt will verbalize understanding of adapted strategies to maximize safety and I with ADLs/ IADLs .    Limitations Pt is an 88 year old man admitted on 03/07/21 with L side weakness. MRI + for small foci ischemia R pons and L frontal periventricular white matter. PMH. Alzheimers dementia, DM, HTN, stage 3 CKD, lung ca, prostate ca, multiple skin cancers, CAD, anxiety, macular degeneration. Pt lives with wife who has hired caregiver, children currently Artist by [name].    Patient Stated Goals get left  arm working, increase independence    Currently in Pain? No/denies                 Treatment:ISidelying on mat A/ROM elbow flexion/ extension and supination and pronation, with pt watching LUE. Inital HEP initiated , see pt instructions                 OT Education - 04/23/21 1336     Education Details Beginning HEP for closed chain shoulder flexion, chest press with paper towel roll, A/ROM supination and wrist flexion/ extension for LUE as well  as flipping playing cards.    Person(s) Educated Patient    Methods Explanation;Demonstration;Tactile cues;Verbal cues;Handout    Comprehension Need further instruction;Verbalized understanding;Returned demonstration;Verbal cues required   discussed with son after therapy             OT Short Term Goals - 04/21/21 1611  OT SHORT TERM GOAL #1   Title I with inital HEP.    Time 4    Period Weeks    Status On-going    Target Date 05/13/21      OT SHORT TERM GOAL #2   Title Pt will donn shirt with set up and supervision and pants with min A.    Time 4    Period Weeks    Status On-going      OT SHORT TERM GOAL #3   Title Pt will demonstrate 90* shoulder flexion in prep for functional reach with LUE with no more than min compensations.    Time 4    Period Weeks    Status On-going      OT SHORT TERM GOAL #4   Title Pt will verbalize understanding of memory compensation strategies.    Time 4    Period Weeks    Status On-going      OT SHORT TERM GOAL #5   Title Pt will demonstrate improved LUE functional use as evidenced by increasing box/ blocks score by 5 blocks    Baseline R 43 blocks, LUE 28 blocks    Time 4    Period Weeks    Status On-going      OT SHORT TERM GOAL #6   Title Pt will verbalize understanding of adapted strategies to maximize safety and I with ADLs/ IADLs .    (UR:KYHCWCB food)    Time 4    Period Weeks    Status On-going               OT Long Term Goals - 04/21/21 1611       OT LONG TERM GOAL #1   Title Pt will perform all basic ADLS with supervision.    Time 12    Period Weeks    Status On-going    Target Date 07/08/21      OT LONG TERM GOAL #2   Title Pt will demonstrate ability to retrieve a lightweight object at 100* with LUE    Time 12    Period Weeks    Status On-going      OT LONG TERM GOAL #3   Title Pt will demonstrate improved fine motor coordination for ADLs  as evidenced by placing 9 pegs in 90 secs or less for 9  hole peg test.    Time 12    Period Weeks    Status On-going      OT LONG TERM GOAL #4   Title Pt will increase LUE grip strength by 5 lbs for increased LUE functional use.    Baseline RUE 65.9 LUE 33.7    Time 12    Period Weeks    Status On-going      OT LONG TERM GOAL #5   Title Pt will perform simple beverage prep with supervision.    Time 12    Period Weeks    Status On-going                   Plan - 04/23/21 1334     Clinical Impression Statement Pt is progressing towards goals. Beginning HEP was issued today, and therapist discussed with pt's son in the lobby. Therapist encouraged pt's son to come in for therapy when he is able for impoved carryover.    OT Occupational Profile and History Detailed Assessment- Review of Records and additional review of physical, cognitive, psychosocial history related to current functional performance  Occupational performance deficits (Please refer to evaluation for details): ADL's;IADL's;Rest and Sleep;Leisure;Social Participation    Body Structure / Function / Physical Skills ADL;UE functional use;Endurance;Balance;Flexibility;Pain;Vision;FMC;Gait;ROM;GMC;Coordination;Decreased knowledge of precautions;Decreased knowledge of use of DME;IADL;Strength;Dexterity;Mobility    Rehab Potential Good    Clinical Decision Making Several treatment options, min-mod task modification necessary   visual and cognitive deficits, decrease coordiantion   Comorbidities Affecting Occupational Performance: May have comorbidities impacting occupational performance   Alzheimer's, macular degeneration, CVA   Modification or Assistance to Complete Evaluation  Min-Moderate modification of tasks or assist with assess necessary to complete eval   visual deficits, extra cueing and  assist required   OT Frequency 2x / week    OT Duration 12 weeks    OT Treatment/Interventions Self-care/ADL training;Ultrasound;Visual/perceptual  remediation/compensation;Patient/family education;DME and/or AE instruction;Aquatic Therapy;Paraffin;Passive range of motion;Balance training;Gait Training;Stair Training;Fluidtherapy;Cryotherapy;Electrical Stimulation;Therapist, nutritional;Therapeutic activities;Manual Therapy;Therapeutic exercise;Splinting;Moist Heat;Neuromuscular education;Cognitive remediation/compensation    Plan review beginning HEP and progress as able, light functional use of LUE, NMR    Consulted and Agree with Plan of Care Patient             Patient will benefit from skilled therapeutic intervention in order to improve the following deficits and impairments:   Body Structure / Function / Physical Skills: ADL, UE functional use, Endurance, Balance, Flexibility, Pain, Vision, FMC, Gait, ROM, GMC, Coordination, Decreased knowledge of precautions, Decreased knowledge of use of DME, IADL, Strength, Dexterity, Mobility       Visit Diagnosis: Hemiplegia and hemiparesis following cerebral infarction affecting left non-dominant side (HCC)  Muscle weakness (generalized)  Unsteadiness on feet  Other lack of coordination  Attention and concentration deficit  Visuospatial deficit  Other abnormalities of gait and mobility    Problem List Patient Active Problem List   Diagnosis Date Noted   Right pontine cerebrovascular accident ([place]) 03/12/2021   Acute ischemic stroke ([place]) 03/08/2021   Hyperkalemia 03/08/2021   Hypomagnesemia 03/08/2021   GERD (gastroesophageal reflux disease) 03/08/2021   Acute CVA (cerebrovascular accident) ([place]) 03/08/2021   COVID-19 01/01/2021   Sensorineural hearing loss (SNHL) of both ears 05/15/2017   Tinnitus, bilateral 05/15/2017   Cough 06/08/2015   Macular degeneration of both eyes 01/21/2014   Tobacco abuse, 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/08/2013   CAD (coronary artery disease)    Pericardial tamponade 09/17/2012   Acute pericarditis, unspecified 09/13/2012   Adenocarcinoma of  left lung, stage 1 ([place]) 07/25/2011   Cancer of prostate w/med recur risk (T2b-c or Gleason 7 or PSA 10-20) ([place]) 07/25/2011   Nonmelanoma skin cancer 07/25/2011   History of colonic polyps 07/25/2011   Type 2 diabetes mellitus with vascular disease ([place]) 05/21/2007   HYPERCHOLESTEROLEMIA 05/21/2007   CARCINOMA, SKIN, SQUAMOUS CELL 01/08/2007   Generalized anxiety disorder 01/08/2007   ALZHEIMER'S DISEASE, MILD 01/08/2007   Essential hypertension 01/08/2007    Gentry Seeber, OT 04/23/2021, 1:40 PM  [facility] [address] [place], [place], [address] Phone: [phone]   Fax:  [phone]  Name: [name] MRN: [id number] Date of Birth: 1931/04/03

## 2021-04-28 ENCOUNTER — Ambulatory Visit: Payer: Medicare HMO | Admitting: Physical Therapy

## 2021-04-28 ENCOUNTER — Other Ambulatory Visit: Payer: Self-pay

## 2021-04-28 ENCOUNTER — Ambulatory Visit: Payer: Medicare HMO | Admitting: Occupational Therapy

## 2021-04-28 DIAGNOSIS — R278 Other lack of coordination: Secondary | ICD-10-CM

## 2021-04-28 DIAGNOSIS — R2681 Unsteadiness on feet: Secondary | ICD-10-CM | POA: Diagnosis not present

## 2021-04-28 DIAGNOSIS — I69354 Hemiplegia and hemiparesis following cerebral infarction affecting left non-dominant side: Secondary | ICD-10-CM | POA: Diagnosis not present

## 2021-04-28 DIAGNOSIS — R41840 Attention and concentration deficit: Secondary | ICD-10-CM | POA: Diagnosis not present

## 2021-04-28 DIAGNOSIS — M6281 Muscle weakness (generalized): Secondary | ICD-10-CM

## 2021-04-28 DIAGNOSIS — R41841 Cognitive communication deficit: Secondary | ICD-10-CM | POA: Diagnosis not present

## 2021-04-28 DIAGNOSIS — H43813 Vitreous degeneration, bilateral: Secondary | ICD-10-CM | POA: Diagnosis not present

## 2021-04-28 DIAGNOSIS — R2689 Other abnormalities of gait and mobility: Secondary | ICD-10-CM

## 2021-04-28 DIAGNOSIS — H31013 Macula scars of posterior pole (postinflammatory) (post-traumatic), bilateral: Secondary | ICD-10-CM | POA: Diagnosis not present

## 2021-04-28 DIAGNOSIS — R471 Dysarthria and anarthria: Secondary | ICD-10-CM | POA: Diagnosis not present

## 2021-04-28 DIAGNOSIS — R262 Difficulty in walking, not elsewhere classified: Secondary | ICD-10-CM

## 2021-04-28 DIAGNOSIS — R41842 Visuospatial deficit: Secondary | ICD-10-CM | POA: Diagnosis not present

## 2021-04-28 DIAGNOSIS — H353223 Exudative age-related macular degeneration, left eye, with inactive scar: Secondary | ICD-10-CM | POA: Diagnosis not present

## 2021-04-28 DIAGNOSIS — H353211 Exudative age-related macular degeneration, right eye, with active choroidal neovascularization: Secondary | ICD-10-CM | POA: Diagnosis not present

## 2021-04-28 DIAGNOSIS — I6350 Cerebral infarction due to unspecified occlusion or stenosis of unspecified cerebral artery: Secondary | ICD-10-CM | POA: Diagnosis not present

## 2021-04-28 NOTE — Therapy (Signed)
[facility] [address] [place], [place], [address] Phone: [phone]   Fax:  [phone]  Occupational Therapy Treatment  Patient Details  Name: [name] MRN: [id number] Date of Birth: November 18, 1930 No data recorded  Encounter Date: 04/28/2021   OT End of Session - 04/28/21 1219     Visit Number 4    Number of Visits 25    Date for OT Re-Evaluation 07/08/21    Authorization Type Aetna Medicare    Authorization - Visit Number 4    Progress Note Due on Visit 10    OT Start Time 1100    OT Stop Time 1145    OT Time Calculation (min) 45 min    Activity Tolerance Patient tolerated treatment well    Behavior During Therapy [facility] for tasks assessed/performed             Past Medical History:  Diagnosis Date   Adenocarcinoma of left lung, stage 1 ([place]) 2001   T1N0 stage I  adenoca left lung resected 01/03/00    Alzheimer's disease ([place])    CAD (coronary artery disease) 2014   a. 09/2012 Cath: LM nl, LAD 50-60p, D1 60-70 m, D1 50ost, LCX nl, RCA min irregs, EF 55-65%.   Generalized anxiety disorder 01/08/2007   Qualifier: Diagnosis of  By: Diona Browner MD, [name]     History of colonic polyps 07/25/2011   Macular degeneration of both eyes 01/21/2014   Nonmelanoma skin cancer 07/25/2011   Multiple lesions excised face/nose   Pericarditis    a. 09/2012 with effusion and tamponade, s/p window.  b.  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 SCC IN SITU TX CX3   SCC (squamous cell carcinoma) 04/12/2005   RIGHT NECK SUP. SCC IN SITU TX EXC   SCC (squamous cell carcinoma) 04/16/2013   LEFT TEMPLE SCC IN SITU TX CX3 5FU   SCC (squamous cell carcinoma) 04/16/2013   BELOW LEFT EYE SCC IN SITU TX CX3 5FU   SCC (squamous cell carcinoma) 04/16/2013   BELOW RIGHT EYE SCC IN SITU TX CX3 5FU   SCC (squamous cell carcinoma) 08/04/2014   LEFT CHEEK SCC IN SITU TX CX3 5FU   SCC (squamous cell carcinoma) 11/27/2018   LEFT TEMPLE SCC IN SITU TX WITH BX   SCC (squamous cell carcinoma)    SCC (squamous cell carcinoma)    SCC (squamous cell carcinoma)    SCC (squamous cell carcinoma) Well Diff 09/13/2016   Tip of Nose SCC WELL DIFF TX (MOH's), and RIGHT INNER EYE SUP. SCC IN SITU TX TO WATCH   SCC (squamous cell carcinoma) Well Diff 05/30/2017   Right Cheekbone (Cx3,5FU) and Under Left Eye (Cx3,5FU)  Squamous cell carcinoma in situ (SCCIS) 04/12/2005   Right Neck Inf (Cx3), Right Neck Sup (Exc), and Bulb of Nose (Cx3,Exc)   Squamous cell carcinoma in situ (SCCIS) 09/27/2005   Nose (Cx3,Exc)   Squamous cell carcinoma in situ (SCCIS) 02/18/2013   Below Left Eye (tx p bx)   Squamous cell carcinoma in situ (SCCIS) 04/16/2013   Left Temple (Cx3,5FU), Below Left Eye (Cx3,5FU), Below Right Eye (Cx3,5FU)   Squamous cell carcinoma in situ (SCCIS) 08/04/2014   Left Cheek (Cx3,5FU)   Squamous cell carcinoma in situ (SCCIS) 09/13/2016   Right Inner Eye Sup. 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   Type 2 diabetes mellitus  with vascular disease ([place]) 05/21/2007   Qualifier: Diagnosis of  By: Diona Browner MD, [name]     Type 2 DM with CKD stage 3 and hypertension ([place]) 07/16/2015   Unspecified essential hypertension     Past Surgical History:  Procedure Laterality Date   HERNIA REPAIR     LEFT HEART CATHETERIZATION WITH CORONARY ANGIOGRAM N/A 09/13/2012   Procedure: LEFT HEART CATHETERIZATION WITH CORONARY ANGIOGRAM;  Surgeon: Sherren Mocha, MD;  Location: [facility] CATH LAB;  Service: Cardiovascular;  Laterality: N/A;   LOBECTOMY  2001   upper left   PERICARDIAL TAP N/A 09/16/2012   Procedure: PERICARDIAL TAP;  Surgeon: Sinclair Grooms, MD;  Location: [facility] CATH LAB;  Service: Cardiovascular;  Laterality: N/A;   PROSTATECTOMY  2003   SUBXYPHOID PERICARDIAL WINDOW N/A 09/16/2012   Procedure: SUBXYPHOID PERICARDIAL WINDOW;  Surgeon: Rexene Alberts, MD;  Location: [facility];  Service: Thoracic;  Laterality: N/A;   TONSILLECTOMY      There were no vitals filed for this visit.   Subjective Assessment - 04/28/21 1106     Subjective  Denies pain    Pertinent History Pt will verbalize understanding of adapted strategies to maximize safety and I with ADLs/ IADLs .    Limitations Pt is an 88 year old man admitted on 03/07/21 with L side weakness. MRI + for small foci ischemia R pons and L frontal periventricular white matter. PMH. Alzheimers dementia, DM, HTN, stage 3 CKD, lung ca, prostate ca, multiple skin cancers, CAD, anxiety, macular degeneration. Pt lives with wife who has hired caregiver, children currently Artist by [name].    Patient Stated Goals get left  arm working, increase independence    Currently in Pain? No/denies             Son not present today.   Reviewed HEP for neuro re-education - pt appeared to benefit from review and demo I'ly after initial cues.  Pt able to perform full wrist extension and full finger extension in thumb, index, and long fingers. Pt lacks MP extension at 4th and  5th fingers and issued buddy strap to assist w/ this during this ex only. Pt does better flipping cards w/o buddy strap as it makes him actively extend fingers better on his own, but cued to have all fingers on card, not just first 3.   Supine and seated: worked on shoulder flexion in low to mid ranges, using cup for visual aid  to prevent sh IR and abd.  Seated: high level closed chain flexion LUE using UE Ranger  Attempted estim to increase 4th and 5th finger extension, however pt w/ poor sensation and did not appear to respond to stimulation with intensity up to 40, therefore d/c                         OT Short Term Goals - 04/21/21 1611       OT SHORT TERM GOAL #1   Title I with inital HEP.    Time 4    Period Weeks    Status On-going    Target Date 05/13/21      OT SHORT TERM GOAL #2   Title Pt will donn shirt with set up and supervision and pants with min A.    Time 4    Period Weeks    Status On-going      OT SHORT TERM GOAL #3   Title Pt will demonstrate 90* shoulder flexion in prep for functional reach with LUE with no more than min compensations.    Time 4    Period Weeks    Status On-going      OT SHORT TERM GOAL #4   Title Pt will verbalize understanding of memory compensation strategies.    Time 4    Period Weeks    Status On-going      OT SHORT TERM GOAL #5   Title Pt will demonstrate improved LUE functional use as evidenced by increasing box/ blocks score by 5 blocks    Baseline R 43 blocks, LUE 28 blocks    Time 4    Period Weeks    Status On-going      OT SHORT TERM GOAL #6   Title Pt will verbalize understanding of adapted strategies to maximize safety and I with ADLs/ IADLs .    (WS:FKCLEXN food)    Time 4    Period Weeks    Status On-going               OT Long Term Goals - 04/21/21 1611       OT LONG TERM GOAL #1   Title Pt will perform all basic ADLS with supervision.    Time 12    Period Weeks    Status  On-going    Target Date 07/08/21      OT LONG TERM GOAL #2   Title Pt will demonstrate ability to retrieve a lightweight object at 100* with LUE    Time 12    Period Weeks    Status On-going      OT LONG TERM GOAL #3   Title Pt will demonstrate improved fine motor coordination for ADLs  as evidenced by placing 9 pegs in 90 secs or less for 9 hole peg test.    Time 12    Period Weeks    Status On-going      OT LONG TERM GOAL #4   Title Pt will increase LUE grip strength by 5 lbs for increased LUE functional use.    Baseline RUE 65.9 LUE 33.7    Time 12    Period Weeks    Status On-going      OT LONG TERM GOAL #5   Title Pt will perform simple beverage prep with supervision.    Time 12    Period Weeks    Status On-going  Plan - 04/28/21 1219     Clinical Impression Statement Pt is progressing towards goals. Pt improving in LUE function    OT Occupational Profile and History Detailed Assessment- Review of Records and additional review of physical, cognitive, psychosocial history related to current functional performance    Occupational performance deficits (Please refer to evaluation for details): ADL's;IADL's;Rest and Sleep;Leisure;Social Participation    Body Structure / Function / Physical Skills ADL;UE functional use;Endurance;Balance;Flexibility;Pain;Vision;FMC;Gait;ROM;GMC;Coordination;Decreased knowledge of precautions;Decreased knowledge of use of DME;IADL;Strength;Dexterity;Mobility    Rehab Potential Good    Clinical Decision Making Several treatment options, min-mod task modification necessary   visual and cognitive deficits, decrease coordiantion   Comorbidities Affecting Occupational Performance: May have comorbidities impacting occupational performance   Alzheimer's, macular degeneration, CVA   Modification or Assistance to Complete Evaluation  Min-Moderate modification of tasks or assist with assess necessary to complete eval   visual  deficits, extra cueing and  assist required   OT Frequency 2x / week    OT Duration 12 weeks    OT Treatment/Interventions Self-care/ADL training;Ultrasound;Visual/perceptual remediation/compensation;Patient/family education;DME and/or AE instruction;Aquatic Therapy;Paraffin;Passive range of motion;Balance training;Gait Training;Stair Training;Fluidtherapy;Cryotherapy;Electrical Stimulation;Therapist, nutritional;Therapeutic activities;Manual Therapy;Therapeutic exercise;Splinting;Moist Heat;Neuromuscular education;Cognitive remediation/compensation    Plan continue light functional use of LUE, NMR    Consulted and Agree with Plan of Care Patient             Patient will benefit from skilled therapeutic intervention in order to improve the following deficits and impairments:   Body Structure / Function / Physical Skills: ADL, UE functional use, Endurance, Balance, Flexibility, Pain, Vision, FMC, Gait, ROM, GMC, Coordination, Decreased knowledge of precautions, Decreased knowledge of use of DME, IADL, Strength, Dexterity, Mobility       Visit Diagnosis: Hemiplegia and hemiparesis following cerebral infarction affecting left non-dominant side (HCC)  Other lack of coordination    Problem List Patient Active Problem List   Diagnosis Date Noted   Right pontine cerebrovascular accident ([place]) 03/12/2021   Acute ischemic stroke ([place]) 03/08/2021   Hyperkalemia 03/08/2021   Hypomagnesemia 03/08/2021   GERD (gastroesophageal reflux disease) 03/08/2021   Acute CVA (cerebrovascular accident) ([place]) 03/08/2021   COVID-19 01/01/2021   Sensorineural hearing loss (SNHL) of both ears 05/15/2017   Tinnitus, bilateral 05/15/2017   Cough 06/08/2015   Macular degeneration of both eyes 01/21/2014   Tobacco abuse, 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/08/2013   CAD (coronary artery disease)    Pericardial tamponade 09/17/2012   Acute pericarditis, unspecified 09/13/2012   Adenocarcinoma of left lung, stage 1  ([place]) 07/25/2011   Cancer of prostate w/med recur risk (T2b-c or Gleason 7 or PSA 10-20) ([place]) 07/25/2011   Nonmelanoma skin cancer 07/25/2011   History of colonic polyps 07/25/2011   Type 2 diabetes mellitus with vascular disease ([place]) 05/21/2007   HYPERCHOLESTEROLEMIA 05/21/2007   CARCINOMA, SKIN, SQUAMOUS CELL 01/08/2007   Generalized anxiety disorder 01/08/2007   ALZHEIMER'S DISEASE, MILD 01/08/2007   Essential hypertension 01/08/2007    Carey Bullocks, OTR/L 04/28/2021, 12:21 PM  [facility] [address] [place], [place], [address] Phone: [phone]   Fax:  [phone]  Name: [name] MRN: [id number] Date of Birth: December 16, 1930

## 2021-04-28 NOTE — Therapy (Signed)
[facility] [address] [place], [place], [address] Phone: [phone]   Fax:  [phone]  Physical Therapy Treatment  Patient Details  Name: [name] MRN: [id number] Date of Birth: May 27, 1930 Referring Provider (PT): Cathlyn Parsons, PA-C   Encounter Date: 04/28/2021   PT End of Session - 04/28/21 1158     Visit Number 4    Number of Visits 16    Date for PT Re-Evaluation 06/09/21    Authorization Type Aetna Medicare    PT Start Time 1150    PT Stop Time 1230    PT Time Calculation (min) 40 min    Equipment Utilized During Treatment Gait belt    Activity Tolerance Patient tolerated treatment well    Behavior During Therapy [facility] for tasks assessed/performed             Past Medical History:  Diagnosis Date   Adenocarcinoma of left lung, stage 1 ([place]) 2001   T1N0 stage I  adenoca left lung resected 01/03/00    Alzheimer's disease ([place])    CAD (coronary artery disease) 2014   a. 09/2012 Cath: LM nl, LAD 50-60p, D1 60-70 m, D1 50ost, LCX nl, RCA min irregs, EF 55-65%.   Generalized anxiety disorder 01/08/2007   Qualifier: Diagnosis of  By: Diona Browner MD, [name]     History of colonic polyps 07/25/2011   Macular degeneration of both eyes 01/21/2014   Nonmelanoma skin cancer 07/25/2011   Multiple lesions excised face/nose   Pericarditis    a. 09/2012 with effusion and tamponade, s/p window.  b.  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 SCC IN SITU TX CX3   SCC (squamous cell carcinoma) 04/12/2005   RIGHT NECK SUP. SCC IN SITU TX EXC   SCC (squamous cell carcinoma) 04/16/2013   LEFT TEMPLE SCC IN SITU TX CX3 5FU   SCC (squamous cell carcinoma) 04/16/2013   BELOW LEFT EYE SCC IN SITU TX CX3 5FU   SCC (squamous cell carcinoma) 04/16/2013   BELOW RIGHT EYE SCC IN SITU TX CX3 5FU   SCC (squamous cell carcinoma) 08/04/2014   LEFT CHEEK SCC IN SITU TX CX3 5FU   SCC (squamous cell carcinoma) 11/27/2018   LEFT TEMPLE SCC IN SITU TX WITH BX   SCC (squamous cell carcinoma)    SCC (squamous cell carcinoma)    SCC (squamous cell carcinoma)    SCC (squamous cell carcinoma) Well Diff 09/13/2016   Tip of Nose SCC WELL DIFF TX (MOH's), and RIGHT INNER EYE SUP. SCC IN SITU TX TO WATCH   SCC (squamous cell carcinoma) Well Diff 05/30/2017   Right Cheekbone (Cx3,5FU) and Under Left Eye (Cx3,5FU)   Squamous cell carcinoma  in situ (SCCIS) 04/12/2005   Right Neck Inf (Cx3), Right Neck Sup (Exc), and Bulb of Nose (Cx3,Exc)   Squamous cell carcinoma in situ (SCCIS) 09/27/2005   Nose (Cx3,Exc)   Squamous cell carcinoma in situ (SCCIS) 02/18/2013   Below Left Eye (tx p bx)   Squamous cell carcinoma in situ (SCCIS) 04/16/2013   Left Temple (Cx3,5FU), Below Left Eye (Cx3,5FU), Below Right Eye (Cx3,5FU)   Squamous cell carcinoma in situ (SCCIS) 08/04/2014   Left Cheek (Cx3,5FU)   Squamous cell carcinoma in situ (SCCIS) 09/13/2016   Right Inner Eye Sup. 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   Type 2 diabetes  mellitus with vascular disease ([place]) 05/21/2007   Qualifier: Diagnosis of  By: Diona Browner MD, [name]     Type 2 DM with CKD stage 3 and hypertension ([place]) 07/16/2015   Unspecified essential hypertension     Past Surgical History:  Procedure Laterality Date   HERNIA REPAIR     LEFT HEART CATHETERIZATION WITH CORONARY ANGIOGRAM N/A 09/13/2012   Procedure: LEFT HEART CATHETERIZATION WITH CORONARY ANGIOGRAM;  Surgeon: Sherren Mocha, MD;  Location: [facility] CATH LAB;  Service: Cardiovascular;  Laterality: N/A;   LOBECTOMY  2001   upper left   PERICARDIAL TAP N/A 09/16/2012   Procedure: PERICARDIAL TAP;  Surgeon: Sinclair Grooms, MD;  Location: [facility] CATH LAB;  Service: Cardiovascular;  Laterality: N/A;   PROSTATECTOMY  2003   SUBXYPHOID PERICARDIAL WINDOW N/A 09/16/2012   Procedure: SUBXYPHOID PERICARDIAL WINDOW;  Surgeon: Rexene Alberts, MD;  Location: [facility];  Service: Thoracic;  Laterality: N/A;   TONSILLECTOMY      There were no vitals filed for this visit.   Subjective Assessment - 04/28/21 1201     Subjective No new changes or falls. Pt states he's been working on his exercises some.    Patient is accompained by: Family member    Pertinent History R pontine CVA 03/07/21. History of Alzheimer's dementia maintained on Aricept type 2 diabetes mellitus hypertension CKD stage III macular degeneration adenocarcinoma left lung with upper lobe resection 2001 prostate cancer with prostatectomy    Limitations Walking;Standing;House hold activities    How long can you sit comfortably? n/a    How long can you stand comfortably? As long as holding on to something    How long can you walk comfortably? Mostly in the house    Patient Stated Goals Pt: walk again without RW. Family: pt amb ind to bathroom (currently requires assist for safety)                  Nmed:  Staggered sit<>stand L LE back 2x10 Standing box taps: 4" box with no HHA, cues to not let the left foot drag when lifting it off or  tapping it, tactile cues at hips provided to improve shift on L LE during R  hip flexion and to prevent trunk collapse: 2 x 10 R and L Tandem stance 2x20 sec, head turns x10, head nods x10 R foot on step 2x20 sec  Pre-gait at counter: Forward/backwards stepping with R UE assist 2x10 Rock forward/backward in staggered stance 2x10 Forward gait by counter and  in // bars R UE assist only x4 reps each                      PT Short Term Goals - 04/14/21 1512       PT SHORT TERM GOAL #1   Title Pt will perform exercises at home at least 3x/wk with supervision as needed    Time 4    Period Weeks    Status New    Target Date 05/12/21      PT SHORT TERM GOAL #2   Title Pt will have improved 5x STS to </=13 sec    Baseline 15.28 sec on eval    Time 4    Period Weeks    Status New    Target Date 05/12/21      PT SHORT TERM GOAL #3   Title Pt will be mod independent with ambulating to/from bathroom for safer home mobility    Time 4    Period Weeks    Status New    Target Date 05/12/21      PT SHORT TERM GOAL #4   Title Pt will have improved Berg Balance Score to at least 42/56    Baseline 36/56    Time 4    Period Weeks    Status New    Target Date 05/12/21      PT SHORT TERM GOAL #5   Title Pt will demo improved TUG score to </=20 sec for decreased fall risk    Baseline 25.09 sec on eval    Time 4    Period Weeks    Status New    Target Date 05/12/21               PT Long Term Goals - 04/14/21 1515       PT LONG TERM GOAL #1   Title Pt will be independent with maintaining exercises at home    Time 8    Period Weeks    Status New    Target Date 06/09/21      PT LONG TERM GOAL #2   Title Pt will be able to amb with LRAD mod I x 800' for safe limited community amb    Time 8    Period Weeks    Status New    Target Date 06/09/21      PT LONG TERM GOAL #3   Title Pt will demo improved Berg Balance Score of at least 45/56 for decreased fall  risk    Time 8    Period Weeks    Status New    Target Date 06/09/21      PT LONG TERM GOAL #4   Title Pt will have improved DGI to at least 19/24 to demo decreased fall risk    Baseline 13/24    Time 8    Period Weeks    Status New    Target Date 06/09/21      PT LONG TERM GOAL #5   Title Pt will have improved FOTO score of 65    Baseline 49    Time 8    Period Weeks    Status New    Target Date 06/09/21                   Plan - 04/28/21 1201     Clinical Impression Statement Treatment focused on strengthening and balance in standing. Started to work on standing balance and endurance  this session. Focused on weightshifting to his L and L LE stability. Initiated amb with only 1 UE support for transition to cane when able.    Personal Factors and Comorbidities Age;Fitness;Time since onset of injury/illness/exacerbation    Examination-Activity Limitations Locomotion Level;Bathing;Squat;Stairs;Stand;Toileting;Transfers;Bed Mobility;Caring for Others    Examination-Participation Restrictions Meal Prep;Cleaning;Community Activity;Laundry;Shop    Stability/Clinical Decision Making Evolving/Moderate complexity    Rehab Potential Good    PT Frequency 2x / week    PT Duration 8 weeks    PT Treatment/Interventions ADLs/Self Care Home Management;Aquatic Therapy;Electrical Stimulation;Moist Heat;DME Instruction;Gait training;Stair training;Functional mobility training;Therapeutic activities;Therapeutic exercise;Balance training;Neuromuscular re-education;Patient/family education;Orthotic Fit/Training;Manual techniques;Passive range of motion;Dry needling;Taping    PT Next Visit Plan work on hip core strengtheing, standing balance, gait    PT Home Exercise Plan Access Code [email or web address]    Consulted and Agree with Plan of Care Patient;Family member/caregiver             Patient will benefit from skilled therapeutic intervention in order to improve the following deficits and  impairments:  Abnormal gait, Decreased coordination, Impaired tone, Decreased endurance, Impaired UE functional use, Decreased activity tolerance, Decreased balance, Improper body mechanics, Decreased mobility, Decreased strength, Postural dysfunction  Visit Diagnosis: Hemiplegia and hemiparesis following cerebral infarction affecting left non-dominant side (HCC)  Muscle weakness (generalized)  Unsteadiness on feet  Other lack of coordination  Difficulty in walking, not elsewhere classified  Other abnormalities of gait and mobility     Problem List Patient Active Problem List   Diagnosis Date Noted   Right pontine cerebrovascular accident ([place]) 03/12/2021   Acute ischemic stroke ([place]) 03/08/2021   Hyperkalemia 03/08/2021   Hypomagnesemia 03/08/2021   GERD (gastroesophageal reflux disease) 03/08/2021   Acute CVA (cerebrovascular accident) ([place]) 03/08/2021   COVID-19 01/01/2021   Sensorineural hearing loss (SNHL) of both ears 05/15/2017   Tinnitus, bilateral 05/15/2017   Cough 06/08/2015   Macular degeneration of both eyes 01/21/2014   Tobacco abuse, 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/08/2013   CAD (coronary artery disease)    Pericardial tamponade 09/17/2012   Acute pericarditis, unspecified 09/13/2012   Adenocarcinoma of left lung, stage 1 ([place]) 07/25/2011   Cancer of prostate w/med recur risk (T2b-c or Gleason 7 or PSA 10-20) ([place]) 07/25/2011   Nonmelanoma skin cancer 07/25/2011   History of colonic polyps 07/25/2011   Type 2 diabetes mellitus with vascular disease ([place]) 05/21/2007   HYPERCHOLESTEROLEMIA 05/21/2007   CARCINOMA, SKIN, SQUAMOUS CELL 01/08/2007   Generalized anxiety disorder 01/08/2007   ALZHEIMER'S DISEASE, MILD 01/08/2007   Essential hypertension 01/08/2007    Emilie Carp April Gordy Levan, PT, DPT 04/28/2021, 12:52 PM  [facility] [address] [place], [place], [address] Phone: [phone]   Fax:   [phone]  Name: [name] MRN: [id number] Date of Birth: [birth year]

## 2021-05-04 ENCOUNTER — Other Ambulatory Visit: Payer: Self-pay

## 2021-05-04 ENCOUNTER — Ambulatory Visit: Payer: Medicare HMO | Admitting: Physical Therapy

## 2021-05-04 ENCOUNTER — Ambulatory Visit: Payer: Medicare HMO | Admitting: Occupational Therapy

## 2021-05-04 DIAGNOSIS — R41840 Attention and concentration deficit: Secondary | ICD-10-CM | POA: Diagnosis not present

## 2021-05-04 DIAGNOSIS — I69354 Hemiplegia and hemiparesis following cerebral infarction affecting left non-dominant side: Secondary | ICD-10-CM

## 2021-05-04 DIAGNOSIS — R2681 Unsteadiness on feet: Secondary | ICD-10-CM

## 2021-05-04 DIAGNOSIS — R2689 Other abnormalities of gait and mobility: Secondary | ICD-10-CM

## 2021-05-04 DIAGNOSIS — R262 Difficulty in walking, not elsewhere classified: Secondary | ICD-10-CM | POA: Diagnosis not present

## 2021-05-04 DIAGNOSIS — R278 Other lack of coordination: Secondary | ICD-10-CM

## 2021-05-04 DIAGNOSIS — I6350 Cerebral infarction due to unspecified occlusion or stenosis of unspecified cerebral artery: Secondary | ICD-10-CM | POA: Diagnosis not present

## 2021-05-04 DIAGNOSIS — R41841 Cognitive communication deficit: Secondary | ICD-10-CM | POA: Diagnosis not present

## 2021-05-04 DIAGNOSIS — R41842 Visuospatial deficit: Secondary | ICD-10-CM | POA: Diagnosis not present

## 2021-05-04 DIAGNOSIS — M6281 Muscle weakness (generalized): Secondary | ICD-10-CM

## 2021-05-04 DIAGNOSIS — R471 Dysarthria and anarthria: Secondary | ICD-10-CM | POA: Diagnosis not present

## 2021-05-04 NOTE — Therapy (Signed)
[facility] [address] [place], [place], [address] Phone: [phone]   Fax:  [phone]  Physical Therapy Treatment  Patient Details  Name: [name] MRN: [id number] Date of Birth: 07/24/1930 Referring Provider (PT): Cathlyn Parsons, PA-C   Encounter Date: 05/04/2021   PT End of Session - 05/04/21 1058     Visit Number 5    Number of Visits 16    Date for PT Re-Evaluation 06/09/21    Authorization Type [employer] Medicare    PT Start Time 1100    PT Stop Time 3875    PT Time Calculation (min) 45 min    Equipment Utilized During Treatment Gait belt    Activity Tolerance Patient tolerated treatment well    Behavior During Therapy [facility] for tasks assessed/performed             Past Medical History:  Diagnosis Date   Adenocarcinoma of left lung, stage 1 ([place]) 2001   T1N0 stage I  adenoca left lung resected 01/03/00    Alzheimer's disease ([place])    CAD (coronary artery disease) 2014   a. 09/2012 Cath: LM nl, LAD 50-60p, D1 60-70 m, D1 50ost, LCX nl, RCA min irregs, EF 55-65%.   Generalized anxiety disorder 01/08/2007   Qualifier: Diagnosis of  By: Diona Browner MD, [name]     History of colonic polyps 07/25/2011   Macular degeneration of both eyes 01/21/2014   Nonmelanoma skin cancer 07/25/2011   Multiple lesions excised face/nose   Pericarditis    a. 09/2012 with effusion and tamponade, s/p window.  b.  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 SCC IN SITU TX CX3   SCC (squamous cell carcinoma) 04/12/2005   RIGHT NECK SUP. SCC IN SITU TX EXC   SCC (squamous cell carcinoma) 04/16/2013   LEFT TEMPLE SCC IN SITU TX CX3 5FU   SCC (squamous cell carcinoma) 04/16/2013   BELOW LEFT EYE SCC IN SITU TX CX3 5FU   SCC (squamous cell carcinoma) 04/16/2013   BELOW RIGHT EYE SCC IN SITU TX CX3 5FU   SCC (squamous cell carcinoma) 08/04/2014   LEFT CHEEK SCC IN SITU TX CX3 5FU   SCC (squamous cell carcinoma) 11/27/2018   LEFT TEMPLE SCC IN SITU TX WITH BX   SCC (squamous cell carcinoma)    SCC (squamous cell carcinoma)    SCC (squamous cell carcinoma)    SCC (squamous cell carcinoma) Well Diff 09/13/2016   Tip of Nose SCC WELL DIFF TX (MOH's), and RIGHT INNER EYE SUP. SCC IN SITU TX TO WATCH   SCC (squamous cell carcinoma) Well Diff 05/30/2017   Right Cheekbone (Cx3,5FU) and Under Left Eye (Cx3,5FU)   Squamous cell carcinoma  in situ (SCCIS) 04/12/2005   Right Neck Inf (Cx3), Right Neck Sup (Exc), and Bulb of Nose (Cx3,Exc)   Squamous cell carcinoma in situ (SCCIS) 09/27/2005   Nose (Cx3,Exc)   Squamous cell carcinoma in situ (SCCIS) 02/18/2013   Below Left Eye (tx p bx)   Squamous cell carcinoma in situ (SCCIS) 04/16/2013   Left Temple (Cx3,5FU), Below Left Eye (Cx3,5FU), Below Right Eye (Cx3,5FU)   Squamous cell carcinoma in situ (SCCIS) 08/04/2014   Left Cheek (Cx3,5FU)   Squamous cell carcinoma in situ (SCCIS) 09/13/2016   Right Inner Eye Sup. 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   Type 2 diabetes  mellitus with vascular disease ([place]) 05/21/2007   Qualifier: Diagnosis of  By: Diona Browner MD, [name]     Type 2 DM with CKD stage 3 and hypertension ([place]) 07/16/2015   Unspecified essential hypertension     Past Surgical History:  Procedure Laterality Date   HERNIA REPAIR     LEFT HEART CATHETERIZATION WITH CORONARY ANGIOGRAM N/A 09/13/2012   Procedure: LEFT HEART CATHETERIZATION WITH CORONARY ANGIOGRAM;  Surgeon: Sherren Mocha, MD;  Location: [facility] CATH LAB;  Service: Cardiovascular;  Laterality: N/A;   LOBECTOMY  2001   upper left   PERICARDIAL TAP N/A 09/16/2012   Procedure: PERICARDIAL TAP;  Surgeon: Sinclair Grooms, MD;  Location: [facility] CATH LAB;  Service: Cardiovascular;  Laterality: N/A;   PROSTATECTOMY  2003   SUBXYPHOID PERICARDIAL WINDOW N/A 09/16/2012   Procedure: SUBXYPHOID PERICARDIAL WINDOW;  Surgeon: Rexene Alberts, MD;  Location: [facility];  Service: Thoracic;  Laterality: N/A;   TONSILLECTOMY      There were no vitals filed for this visit.   Subjective Assessment - 05/04/21 1059     Subjective No falls noted. Has been working his exercises. Thinks he's ready to progress his strengthening to resistance bands.    Patient is accompained by: Family member    Pertinent History R pontine CVA 03/07/21. History of Alzheimer's dementia maintained on Aricept type 2 diabetes mellitus hypertension CKD stage III macular degeneration adenocarcinoma left lung with upper lobe resection 2001 prostate cancer with prostatectomy    Limitations Walking;Standing;House hold activities    How long can you sit comfortably? n/a    How long can you stand comfortably? As long as holding on to something    How long can you walk comfortably? Mostly in the house    Patient Stated Goals Pt: walk again without RW. Family: pt amb ind to bathroom (currently requires assist for safety)    Currently in Pain? No/denies                  Supine SLR:  2 x 10, red tband Sidelying clamshell red tband 2x10 Supine  bridge walkout: 2 x 5 Supine hooklying alternating march with slow eccentric lowering: 2 x 10.  Staggered Sit to stand: 1x10 , 1x 8, no HHA from low mat table Tandem stance 2x30 sec Standing box taps: 4" box with L UE on walker in front, cues to not let the left foot drag when lifting it off or tapping it, tactile cues at hips provided to improve shift on  L LE during R  hip flexion and to prevent trunk collapse: 2 x 10 R and L Amb next to counter x4 reps with 1 hand hold tactile cues for weight shift (min A for turning)                       PT Short Term Goals - 04/14/21 1512       PT SHORT TERM GOAL #1   Title Pt will perform exercises at home at least 3x/wk with supervision as needed    Time 4    Period Weeks    Status New    Target Date 05/12/21      PT SHORT TERM GOAL #2   Title Pt will have improved 5x STS to </=13 sec    Baseline 15.28 sec on eval    Time 4    Period Weeks    Status New    Target Date 05/12/21      PT SHORT TERM GOAL #3   Title Pt will be mod independent with ambulating to/from bathroom for safer home mobility    Time 4    Period Weeks    Status New    Target Date 05/12/21      PT SHORT TERM GOAL #4   Title Pt will have improved Berg Balance Score to at least 42/56    Baseline 36/56    Time 4    Period Weeks    Status New    Target Date 05/12/21      PT SHORT TERM GOAL #5   Title Pt will demo improved TUG score to </=20 sec for decreased fall risk    Baseline 25.09 sec on eval    Time 4    Period Weeks    Status New    Target Date 05/12/21               PT Long Term Goals - 04/14/21 1515       PT LONG TERM GOAL #1   Title Pt will be independent with maintaining exercises at home    Time 8    Period Weeks    Status New    Target Date 06/09/21      PT LONG TERM GOAL #2   Title Pt will be able to amb with LRAD mod I x 800' for safe limited community amb    Time 8    Period Weeks    Status New    Target Date  06/09/21      PT LONG TERM GOAL #3   Title Pt will demo improved Berg Balance Score of at least 45/56 for decreased fall risk    Time 8    Period Weeks    Status New    Target Date 06/09/21      PT LONG TERM GOAL #4   Title Pt will have improved DGI to at least 19/24 to demo decreased fall risk    Baseline 13/24    Time 8    Period Weeks    Status New    Target Date 06/09/21      PT LONG TERM GOAL #5   Title Pt will have improved FOTO score of 65    Baseline 49    Time 8    Period Weeks    Status New    Target Date 06/09/21  Plan - 05/04/21 1107     Clinical Impression Statement Treatment focused on reviewing and modifying pt's strength and balance HEP. Pt able to tolerate red tband for clamshell and straight leg raises. Bridging has improved and attempted to progress accordingly -- will check next session if this modication is too hard. Worked on continuing to improve standing weight shift and balance. Limited by fatigue.    Personal Factors and Comorbidities Age;Fitness;Time since onset of injury/illness/exacerbation    Examination-Activity Limitations Locomotion Level;Bathing;Squat;Stairs;Stand;Toileting;Transfers;Bed Mobility;Caring for Others    Examination-Participation Restrictions Meal Prep;Cleaning;Community Activity;Laundry;Shop    Stability/Clinical Decision Making Evolving/Moderate complexity    Rehab Potential Good    PT Frequency 2x / week    PT Duration 8 weeks    PT Treatment/Interventions ADLs/Self Care Home Management;Aquatic Therapy;Electrical Stimulation;Moist Heat;DME Instruction;Gait training;Stair training;Functional mobility training;Therapeutic activities;Therapeutic exercise;Balance training;Neuromuscular re-education;Patient/family education;Orthotic Fit/Training;Manual techniques;Passive range of motion;Dry needling;Taping    PT Next Visit Plan work on hip core strengtheing, standing balance, gait    PT Home Exercise Plan  Access Code [email or web address]    Consulted and Agree with Plan of Care Patient;Family member/caregiver             Patient will benefit from skilled therapeutic intervention in order to improve the following deficits and impairments:  Abnormal gait, Decreased coordination, Impaired tone, Decreased endurance, Impaired UE functional use, Decreased activity tolerance, Decreased balance, Improper body mechanics, Decreased mobility, Decreased strength, Postural dysfunction  Visit Diagnosis: Hemiplegia and hemiparesis following cerebral infarction affecting left non-dominant side (HCC)  Muscle weakness (generalized)  Unsteadiness on feet  Other lack of coordination  Difficulty in walking, not elsewhere classified  Other abnormalities of gait and mobility     Problem List Patient Active Problem List   Diagnosis Date Noted   Right pontine cerebrovascular accident ([place]) 03/12/2021   Acute ischemic stroke ([place]) 03/08/2021   Hyperkalemia 03/08/2021   Hypomagnesemia 03/08/2021   GERD (gastroesophageal reflux disease) 03/08/2021   Acute CVA (cerebrovascular accident) ([place]) 03/08/2021   COVID-19 01/01/2021   Sensorineural hearing loss (SNHL) of both ears 05/15/2017   Tinnitus, bilateral 05/15/2017   Cough 06/08/2015   Macular degeneration of both eyes 01/21/2014   Tobacco abuse, 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/08/2013   CAD (coronary artery disease)    Pericardial tamponade 09/17/2012   Acute pericarditis, unspecified 09/13/2012   Adenocarcinoma of left lung, stage 1 ([place]) 07/25/2011   Cancer of prostate w/med recur risk (T2b-c or Gleason 7 or PSA 10-20) ([place]) 07/25/2011   Nonmelanoma skin cancer 07/25/2011   History of colonic polyps 07/25/2011   Type 2 diabetes mellitus with vascular disease ([place]) 05/21/2007   HYPERCHOLESTEROLEMIA 05/21/2007   CARCINOMA, SKIN, SQUAMOUS CELL 01/08/2007   Generalized anxiety disorder 01/08/2007   ALZHEIMER'S DISEASE, MILD 01/08/2007   Essential hypertension 01/08/2007     Sharena Dibenedetto April Gordy Levan, PT, DPT 05/04/2021, 11:46 AM  [facility] [address] [place], [place], [address] Phone: [phone]   Fax:  [phone]  Name: [name] MRN: [id number] Date of Birth: 1930-11-16

## 2021-05-04 NOTE — Therapy (Signed)
[facility] [address] [place], [place], [address] Phone: [phone]   Fax:  [phone]  Occupational Therapy Treatment  Patient Details  Name: [name] MRN: [id number] Date of Birth: Sep 10, 1930 No data recorded  Encounter Date: 05/04/2021   OT End of Session - 05/04/21 1054     Visit Number 5    Number of Visits 25    Date for OT Re-Evaluation 07/08/21    Authorization Type Aetna Medicare    Authorization - Visit Number 5    Progress Note Due on Visit 10    OT Start Time 1020    OT Stop Time 1100    OT Time Calculation (min) 40 min    Activity Tolerance Patient tolerated treatment well    Behavior During Therapy [facility] for tasks assessed/performed             Past Medical History:  Diagnosis Date   Adenocarcinoma of left lung, stage 1 ([place]) 2001   T1N0 stage I  adenoca left lung resected 01/03/00    Alzheimer's disease ([place])    CAD (coronary artery disease) 2014   a. 09/2012 Cath: LM nl, LAD 50-60p, D1 60-70 m, D1 50ost, LCX nl, RCA min irregs, EF 55-65%.   Generalized anxiety disorder 01/08/2007   Qualifier: Diagnosis of  By: Diona Browner MD, [name]     History of colonic polyps 07/25/2011   Macular degeneration of both eyes 01/21/2014   Nonmelanoma skin cancer 07/25/2011   Multiple lesions excised face/nose   Pericarditis    a. 09/2012 with effusion and tamponade, s/p window.  b.  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 SCC IN SITU TX CX3   SCC (squamous cell carcinoma) 04/12/2005   RIGHT NECK SUP. SCC IN SITU TX EXC   SCC (squamous cell carcinoma) 04/16/2013   LEFT TEMPLE SCC IN SITU TX CX3 5FU   SCC (squamous cell carcinoma) 04/16/2013   BELOW LEFT EYE SCC IN SITU TX CX3 5FU   SCC (squamous cell carcinoma) 04/16/2013   BELOW RIGHT EYE SCC IN SITU TX CX3 5FU   SCC (squamous cell carcinoma) 08/04/2014   LEFT CHEEK SCC IN SITU TX CX3 5FU   SCC (squamous cell carcinoma) 11/27/2018   LEFT TEMPLE SCC IN SITU TX WITH BX   SCC (squamous cell carcinoma)    SCC (squamous cell carcinoma)    SCC (squamous cell carcinoma)    SCC (squamous cell carcinoma) Well Diff 09/13/2016   Tip of Nose SCC WELL DIFF TX (MOH's), and RIGHT INNER EYE SUP. SCC IN SITU TX TO WATCH   SCC (squamous cell carcinoma) Well Diff 05/30/2017   Right Cheekbone (Cx3,5FU) and Under Left Eye (Cx3,5FU)  Squamous cell carcinoma in situ (SCCIS) 04/12/2005   Right Neck Inf (Cx3), Right Neck Sup (Exc), and Bulb of Nose (Cx3,Exc)   Squamous cell carcinoma in situ (SCCIS) 09/27/2005   Nose (Cx3,Exc)   Squamous cell carcinoma in situ (SCCIS) 02/18/2013   Below Left Eye (tx p bx)   Squamous cell carcinoma in situ (SCCIS) 04/16/2013   Left Temple (Cx3,5FU), Below Left Eye (Cx3,5FU), Below Right Eye (Cx3,5FU)   Squamous cell carcinoma in situ (SCCIS) 08/04/2014   Left Cheek (Cx3,5FU)   Squamous cell carcinoma in situ (SCCIS) 09/13/2016   Right Inner Eye Sup. 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   Type 2 diabetes mellitus  with vascular disease ([place]) 05/21/2007   Qualifier: Diagnosis of  By: Diona Browner MD, [name]     Type 2 DM with CKD stage 3 and hypertension ([place]) 07/16/2015   Unspecified essential hypertension     Past Surgical History:  Procedure Laterality Date   HERNIA REPAIR     LEFT HEART CATHETERIZATION WITH CORONARY ANGIOGRAM N/A 09/13/2012   Procedure: LEFT HEART CATHETERIZATION WITH CORONARY ANGIOGRAM;  Surgeon: Sherren Mocha, MD;  Location[facility] C[facility] LAB;  Service: Cardiovascular;  Laterality: N/A;   LOBECTOMY  2001   upper left   PERICARDIAL TAP N/A 09/16/2012   Procedure: PERICARDIAL TAP;  Surgeon: Sinclair Grooms, MD;  Location: [facility] CATH LAB;  Service: Cardiovascular;  Laterality: N/A;   PROSTATECTOMY  2003   SUBXYPHOID PERICARDIAL WINDOW N/A 09/16/2012   Procedure: SUBXYPHOID PERICARDIAL WINDOW;  Surgeon: Rexene Alberts, MD;  Location: [facility];  Service: Thoracic;  Laterality: N/A;   TONSILLECTOMY      There were no vitals filed for this visit.   Subjective Assessment - 05/04/21 1022     Subjective  Denies pain    Pertinent History Pt is an 88 year old man admitted on 03/07/21 with L side weakness. MRI + for small foci ischemia R pons and L frontal periventricular white matter. PMH. Alzheimers dementia, DM, HTN, stage 3 CKD, lung ca, prostate ca, multiple skin cancers, CAD, anxiety, macular degeneration. Pt lives with wife who has hired caregiver, children currently assisting    Limitations HOH, macular degeneration    Special Tests Goes by [name].    Patient Stated Goals get left  arm working, increase independence    Currently in Pain? No/denies              Supine: BUE sh flexion w/ 2 lb dumbbell in hand (palms facing) followed by unilateral LUE sh flexion (chest press motion) w/ 2 lb dumbbell w/ tactile and verbal cues to prevent IR for both.   Seated: worked on isolated finger extension (unable ring finger) x 5 each, and full composite wrist and finger extension w/ ring finger buddy  strapped to long finger x 10 reps w/ cues to move slowly through motion.   Progressed to functional reaching with LUE low to mid ranges to place checkers into Connect 4 slots w/ difficulty seeing correct slots d/t macular degeneration (Discussed strategies for this). Followed by picking up and moving cones w/ focus on getting ring and small fingers fully extended and around cup  UBE x 8 min for normal reciprocal movement pattern, level 1                     OT Short Term Goals - 05/04/21 1055       OT SHORT TERM GOAL #1   Title I with inital HEP.    Time 4    Period Weeks    Status Achieved    Target Date 05/13/21      OT SHORT TERM GOAL #2   Title Pt will donn shirt with set up and supervision and pants with min A.    Time 4    Period Weeks    Status Achieved      OT SHORT TERM GOAL #3   Title Pt will demonstrate 90* shoulder flexion in prep for functional reach with LUE with no more than min compensations.    Time 4    Period Weeks    Status On-going      OT SHORT TERM GOAL #4   Title Pt will verbalize understanding of memory compensation strategies.    Time 4    Period Weeks    Status On-going      OT SHORT TERM GOAL #5   Title Pt will demonstrate improved LUE functional use as evidenced by increasing box/ blocks score by 5 blocks    Baseline R 43 blocks, LUE 28 blocks    Time 4    Period Weeks    Status On-going      OT SHORT TERM GOAL #6   Title Pt will verbalize understanding of adapted strategies to maximize safety and I with ADLs/ IADLs .    (HF:WYOVZCH food)    Time 4    Period Weeks    Status On-going               OT Long Term Goals - 04/21/21 1611       OT LONG TERM GOAL #1   Title Pt will perform all basic ADLS with supervision.    Time 12    Period Weeks    Status On-going    Target Date 07/08/21      OT LONG TERM GOAL #2   Title Pt will demonstrate ability to retrieve a lightweight object at 100* with LUE    Time 12     Period Weeks    Status On-going      OT LONG TERM GOAL #3   Title Pt will demonstrate improved fine motor coordination for ADLs  as evidenced by placing 9 pegs in 90 secs or less for 9 hole peg test.    Time 12    Period Weeks    Status On-going      OT LONG TERM GOAL #4   Title Pt will increase LUE grip strength by 5 lbs for increased LUE functional use.    Baseline RUE 65.9 LUE 33.7    Time 12    Period Weeks    Status On-going      OT LONG TERM GOAL #5   Title Pt will perform simple beverage prep with supervision.    Time 12    Period Weeks    Status On-going                   Plan - 05/04/21 1055     Clinical Impression Statement Pt has met 2 STG's. Pt improving with LUE function. Limited by Lt ring finger lack of MP extension  OT Occupational Profile and History Detailed Assessment- Review of Records and additional review of physical, cognitive, psychosocial history related to current functional performance    Occupational performance deficits (Please refer to evaluation for details): ADL's;IADL's;Rest and Sleep;Leisure;Social Participation    Body Structure / Function / Physical Skills ADL;UE functional use;Endurance;Balance;Flexibility;Pain;Vision;FMC;Gait;ROM;GMC;Coordination;Decreased knowledge of precautions;Decreased knowledge of use of DME;IADL;Strength;Dexterity;Mobility    Rehab Potential Good    Clinical Decision Making Several treatment options, min-mod task modification necessary   visual and cognitive deficits, decrease coordiantion   Comorbidities Affecting Occupational Performance: May have comorbidities impacting occupational performance   Alzheimer's, macular degeneration, CVA   Modification or Assistance to Complete Evaluation  Min-Moderate modification of tasks or assist with assess necessary to complete eval   visual deficits, extra cueing and  assist required   OT Frequency 2x / week    OT Duration 12 weeks    OT Treatment/Interventions  Self-care/ADL training;Ultrasound;Visual/perceptual remediation/compensation;Patient/family education;DME and/or AE instruction;Aquatic Therapy;Paraffin;Passive range of motion;Balance training;Gait Training;Stair Training;Fluidtherapy;Cryotherapy;Electrical Stimulation;Therapist, nutritional;Therapeutic activities;Manual Therapy;Therapeutic exercise;Splinting;Moist Heat;Neuromuscular education;Cognitive remediation/compensation    Plan review memory strategies, strategies for macular degeneration and cutitng food for safety, continue LUE NMR and functional use    Consulted and Agree with Plan of Care Patient             Patient will benefit from skilled therapeutic intervention in order to improve the following deficits and impairments:   Body Structure / Function / Physical Skills: ADL, UE functional use, Endurance, Balance, Flexibility, Pain, Vision, FMC, Gait, ROM, GMC, Coordination, Decreased knowledge of precautions, Decreased knowledge of use of DME, IADL, Strength, Dexterity, Mobility       Visit Diagnosis: Hemiplegia and hemiparesis following cerebral infarction affecting left non-dominant side (HCC)  Muscle weakness (generalized)  Other lack of coordination    Problem List Patient Active Problem List   Diagnosis Date Noted   Right pontine cerebrovascular accident ([place]) 03/12/2021   Acute ischemic stroke ([place]) 03/08/2021   Hyperkalemia 03/08/2021   Hypomagnesemia 03/08/2021   GERD (gastroesophageal reflux disease) 03/08/2021   Acute CVA (cerebrovascular accident) ([place]) 03/08/2021   COVID-19 01/01/2021   Sensorineural hearing loss (SNHL) of both ears 05/15/2017   Tinnitus, bilateral 05/15/2017   Cough 06/08/2015   Macular degeneration of both eyes 01/21/2014   Tobacco abuse, 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/08/2013   CAD (coronary artery disease)    Pericardial tamponade 09/17/2012   Acute pericarditis, unspecified 09/13/2012   Adenocarcinoma of left lung, stage 1 ([place])  07/25/2011   Cancer of prostate w/med recur risk (T2b-c or Gleason 7 or PSA 10-20) ([place]) 07/25/2011   Nonmelanoma skin cancer 07/25/2011   History of colonic polyps 07/25/2011   Type 2 diabetes mellitus with vascular disease ([place]) 05/21/2007   HYPERCHOLESTEROLEMIA 05/21/2007   CARCINOMA, SKIN, SQUAMOUS CELL 01/08/2007   Generalized anxiety disorder 01/08/2007   ALZHEIMER'S DISEASE, MILD 01/08/2007   Essential hypertension 01/08/2007    Carey Bullocks, OTR/L 05/04/2021, 11:51 AM  [facility] [address] [place], [place], [address] Phone: [phone]   Fax:  [phone]  Name: [name] MRN: [id number] Date of Birth: 1931-03-05

## 2021-05-05 ENCOUNTER — Telehealth: Payer: Self-pay | Admitting: Family Medicine

## 2021-05-05 DIAGNOSIS — E1159 Type 2 diabetes mellitus with other circulatory complications: Secondary | ICD-10-CM

## 2021-05-05 MED ORDER — FREESTYLE LIBRE 2 READER DEVI: 0 refills | Status: DC

## 2021-05-05 MED ORDER — FREESTYLE LIBRE 2 SENSOR MISC: 11 refills | Status: DC

## 2021-05-05 NOTE — Telephone Encounter (Signed)
Prescriptions for [employer] 2 Sensor and Reading Device sent to [facility] as requested.

## 2021-05-05 NOTE — Telephone Encounter (Signed)
Incoming call from [place], pt's spouse, requesting a new rx for [employer] #2 & [employer] #2 knowing the pt recently had a stroke & is currently needing to check his blood sugar frequently (up to every 3 hrs).  1.Medication Requested:  [employer] Sensor #2 & [employer] Sensor #2  2. Pharmacy (Name, Street, [place]):  [id number] - [place], [place]   3. On Med List:  No  4. Last Visit with PCP:  04/15/21  (I/P 03/06/21 - 04/02/21)  5. Next visit date with PCP:  Not @ this time   Agent: Please be advised that RX refills may take up to 3 business days. We ask that you follow-up with your pharmacy.

## 2021-05-06 ENCOUNTER — Ambulatory Visit: Payer: Medicare HMO | Attending: Family Medicine

## 2021-05-06 ENCOUNTER — Ambulatory Visit: Payer: Medicare HMO | Admitting: Occupational Therapy

## 2021-05-06 ENCOUNTER — Other Ambulatory Visit: Payer: Self-pay

## 2021-05-06 VITALS — BP 134/68 | HR 80

## 2021-05-06 DIAGNOSIS — I69354 Hemiplegia and hemiparesis following cerebral infarction affecting left non-dominant side: Secondary | ICD-10-CM

## 2021-05-06 DIAGNOSIS — R278 Other lack of coordination: Secondary | ICD-10-CM | POA: Insufficient documentation

## 2021-05-06 DIAGNOSIS — M6281 Muscle weakness (generalized): Secondary | ICD-10-CM | POA: Insufficient documentation

## 2021-05-06 DIAGNOSIS — R2689 Other abnormalities of gait and mobility: Secondary | ICD-10-CM | POA: Diagnosis present

## 2021-05-06 DIAGNOSIS — R41840 Attention and concentration deficit: Secondary | ICD-10-CM

## 2021-05-06 DIAGNOSIS — R41842 Visuospatial deficit: Secondary | ICD-10-CM | POA: Insufficient documentation

## 2021-05-06 DIAGNOSIS — R262 Difficulty in walking, not elsewhere classified: Secondary | ICD-10-CM | POA: Diagnosis present

## 2021-05-06 DIAGNOSIS — R2681 Unsteadiness on feet: Secondary | ICD-10-CM | POA: Diagnosis present

## 2021-05-06 NOTE — Patient Instructions (Addendum)

## 2021-05-06 NOTE — Therapy (Signed)
[facility] [address] [place], [place], [address] Phone: [phone]   Fax:  [phone]  Occupational Therapy Treatment  Patient Details  Name: [name] MRN: [id number] Date of Birth: 09/14/1930 No data recorded  Encounter Date: 05/06/2021   OT End of Session - 05/06/21 0933     Visit Number 6    Number of Visits 25    Date for OT Re-Evaluation 07/08/21    Authorization Type [employer]    Authorization - Visit Number 6    Progress Note Due on Visit 10    OT Start Time 0845    OT Stop Time 0930    OT Time Calculation (min) 45 min    Activity Tolerance Patient tolerated treatment well    Behavior During Therapy [facility] for tasks assessed/performed             Past Medical History:  Diagnosis Date   Adenocarcinoma of left lung, stage 1 ([place]) 2001   T1N0 stage I  adenoca left lung resected 01/03/00    Alzheimer's disease ([place])    CAD (coronary artery disease) 2014   a. 09/2012 Cath: LM nl, LAD 50-60p, D1 60-70 m, D1 50ost, LCX nl, RCA min irregs, EF 55-65%.   Generalized anxiety disorder 01/08/2007   Qualifier: Diagnosis of  By: Diona Browner MD, [name]     History of colonic polyps 07/25/2011   Macular degeneration of both eyes 01/21/2014   Nonmelanoma skin cancer 07/25/2011   Multiple lesions excised face/nose   Pericarditis    a. 09/2012 with effusion and tamponade, s/p window.  b.  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 SCC IN SITU TX CX3   SCC (squamous cell carcinoma) 04/12/2005   RIGHT NECK SUP. SCC IN SITU TX EXC   SCC (squamous cell carcinoma) 04/16/2013   LEFT TEMPLE SCC IN SITU TX CX3 5FU   SCC (squamous cell carcinoma) 04/16/2013   BELOW LEFT EYE SCC IN SITU TX CX3 5FU   SCC (squamous cell carcinoma) 04/16/2013   BELOW RIGHT EYE SCC IN SITU TX CX3 5FU   SCC (squamous cell carcinoma) 08/04/2014   LEFT CHEEK SCC IN SITU TX CX3 5FU   SCC (squamous cell carcinoma) 11/27/2018   LEFT TEMPLE SCC IN SITU TX WITH BX   SCC (squamous cell carcinoma)    SCC (squamous cell carcinoma)    SCC (squamous cell carcinoma)    SCC (squamous cell carcinoma) Well Diff 09/13/2016   Tip of Nose SCC WELL DIFF TX (MOH's), and RIGHT INNER EYE SUP. SCC IN SITU TX TO WATCH   SCC (squamous cell carcinoma) Well Diff 05/30/2017   Right Cheekbone (Cx3,5FU) and Under Left Eye (Cx3,5FU)  Squamous cell carcinoma in situ (SCCIS) 04/12/2005   Right Neck Inf (Cx3), Right Neck Sup (Exc), and Bulb of Nose (Cx3,Exc)   Squamous cell carcinoma in situ (SCCIS) 09/27/2005   Nose (Cx3,Exc)   Squamous cell carcinoma in situ (SCCIS) 02/18/2013   Below Left Eye (tx p bx)   Squamous cell carcinoma in situ (SCCIS) 04/16/2013   Left Temple (Cx3,5FU), Below Left Eye (Cx3,5FU), Below Right Eye (Cx3,5FU)   Squamous cell carcinoma in situ (SCCIS) 08/04/2014   Left Cheek (Cx3,5FU)   Squamous cell carcinoma in situ (SCCIS) 09/13/2016   Right Inner Eye Sup. 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   Type 2 diabetes mellitus with  vascular disease ([place]) 05/21/2007   Qualifier: Diagnosis of  By: Diona Browner MD, [name]     Type 2 DM with CKD stage 3 and hypertension ([place]) 07/16/2015   Unspecified essential hypertension     Past Surgical History:  Procedure Laterality Date   HERNIA REPAIR     LEFT HEART CATHETERIZATION WITH CORONARY ANGIOGRAM N/A 09/13/2012   Procedure: LEFT HEART CATHETERIZATION WITH CORONARY ANGIOGRAM;  Surgeon: Sherren Mocha, MD;  Location: [facility] CATH LAB;  Service: Cardiovascular;  Laterality: N/A;   LOBECTOMY  2001   upper left   PERICARDIAL TAP N/A 09/16/2012   Procedure: PERICARDIAL TAP;  Surgeon: Sinclair Grooms, MD;  Location: [facility] CATH LAB;  Service: Cardiovascular;  Laterality: N/A;   PROSTATECTOMY  2003   SUBXYPHOID PERICARDIAL WINDOW N/A 09/16/2012   Procedure: SUBXYPHOID PERICARDIAL WINDOW;  Surgeon: Rexene Alberts, MD;  Location: [facility];  Service: Thoracic;  Laterality: N/A;   TONSILLECTOMY      There were no vitals filed for this visit.   Subjective Assessment - 05/06/21 0852     Subjective  I'm doing alright    Pertinent History Pt is an 88 year old man admitted on 03/07/21 with L side weakness. MRI + for small foci ischemia R pons and L frontal periventricular white matter. PMH. Alzheimers dementia, DM, HTN, stage 3 CKD, lung ca, prostate ca, multiple skin cancers, CAD, anxiety, macular degeneration. Pt lives with wife who has hired caregiver, children currently assisting    Limitations HOH, macular degeneration    Special Tests Goes by [name].    Patient Stated Goals get left  arm working, increase independence    Currently in Pain? No/denies             Issued and discussed memory compensatory strategies (made font 30, but pt could only read bold print). Discussed possible use of tape recorder and alarms since pt is unable to see well to write (d/t macular degeneration)  Simulated cutting food w/ strategies for vision (turning head, high contrast) and recommended not lifting knife up off  "food" as he loses his spot. Pt instructed not to chop/cut raw veggies,etc w/ sharp knives; only for cooked meat, veggies w/ butter knife.   Discussed use of felt or colored stickers for buttons on microwave.                     OT Education - 05/06/21 0857     Education Details memory strategies,  reviewed some compensations for macular degeneration    Person(s) Educated Patient    Methods Explanation;Handout    Comprehension Verbalized understanding              OT Short Term Goals - 05/06/21 0933       OT SHORT TERM GOAL #1   Title I with inital HEP.    Time 4    Period Weeks    Status Achieved    Target Date 05/13/21      OT SHORT TERM GOAL #2   Title Pt will donn shirt with set up and supervision and pants with min A.    Time 4    Period Weeks    Status Achieved      OT SHORT TERM GOAL #3   Title Pt will demonstrate 90* shoulder flexion in prep for functional reach with LUE with no more than min compensations.    Time 4    Period Weeks    Status On-going      OT SHORT TERM GOAL #4   Title Pt will verbalize understanding of memory compensation strategies.    Time 4    Period Weeks    Status On-going      OT SHORT TERM GOAL #5   Title Pt will demonstrate improved LUE functional use as evidenced by increasing box/ blocks score by 5 blocks    Baseline R 43 blocks, LUE 28 blocks    Time 4    Period Weeks    Status On-going      OT SHORT TERM GOAL #6   Title Pt will verbalize understanding of adapted strategies to maximize safety and I with ADLs/ IADLs .    (IW:[id number] food)    Time 4    Period Weeks    Status On-going               OT Long Term Goals - 04/21/21 1611       OT LONG TERM GOAL #1   Title Pt will perform all basic ADLS with supervision.    Time 12    Period Weeks    Status On-going    Target Date 07/08/21      OT LONG TERM GOAL #2   Title Pt will demonstrate ability to retrieve a lightweight object at 100* with  LUE    Time 12    Period Weeks    Status On-going      OT LONG TERM GOAL #3   Title Pt will demonstrate improved fine motor coordination for ADLs  as evidenced by placing 9 pegs in 90 secs or less for 9 hole peg test.    Time 12    Period Weeks    Status On-going      OT LONG TERM GOAL #4   Title Pt will increase LUE grip strength by 5 lbs for increased LUE functional use.    Baseline RUE 65.9 LUE 33.7    Time 12    Period Weeks    Status On-going      OT LONG TERM GOAL #5   Title Pt will perform simple beverage prep with supervision.    Time 12    Period Weeks    Status On-going                   Plan - 05/06/21 0934     Clinical Impression Statement Pt progressing towards goals. Pt w/ difficulty seeing details (unable to read even w/  larger print)  due to macular degeneration    OT Occupational Profile and History Detailed Assessment- Review of Records and additional review of physical, cognitive, psychosocial history related to current functional performance    Occupational performance deficits (Please refer to evaluation for details): ADL's;IADL's;Rest and Sleep;Leisure;Social Participation    Body Structure / Function / Physical Skills ADL;UE functional use;Endurance;Balance;Flexibility;Pain;Vision;FMC;Gait;ROM;GMC;Coordination;Decreased knowledge of precautions;Decreased knowledge of use of DME;IADL;Strength;Dexterity;Mobility    Rehab Potential Good    Clinical Decision Making Several treatment options, min-mod task modification necessary   visual and cognitive deficits, decrease coordiantion   Comorbidities Affecting Occupational Performance: May have comorbidities impacting occupational performance   Alzheimer's, macular degeneration, CVA   Modification or Assistance to Complete Evaluation  Min-Moderate modification of tasks or assist with assess necessary to complete eval   visual deficits, extra cueing and  assist required   OT Frequency 2x / week    OT  Duration 12 weeks    OT Treatment/Interventions Self-care/ADL training;Ultrasound;Visual/perceptual remediation/compensation;Patient/family education;DME and/or AE instruction;Aquatic Therapy;Paraffin;Passive range of motion;Balance training;Gait Training;Stair Training;Fluidtherapy;Cryotherapy;Electrical Stimulation;Therapist, nutritional;Therapeutic activities;Manual Therapy;Therapeutic exercise;Splinting;Moist Heat;Neuromuscular education;Cognitive remediation/compensation    Plan continue LUE NMR and functional use    Consulted and Agree with Plan of Care Patient             Patient will benefit from skilled therapeutic intervention in order to improve the following deficits and impairments:   Body Structure / Function / Physical Skills: ADL, UE functional use, Endurance, Balance, Flexibility, Pain, Vision, FMC, Gait, ROM, GMC, Coordination, Decreased knowledge of precautions, Decreased knowledge of use of DME, IADL, Strength, Dexterity, Mobility       Visit Diagnosis: Hemiplegia and hemiparesis following cerebral infarction affecting left non-dominant side (HCC)  Attention and concentration deficit  Visuospatial deficit    Problem List Patient Active Problem List   Diagnosis Date Noted   Right pontine cerebrovascular accident ([place]) 03/12/2021   Acute ischemic stroke ([place]) 03/08/2021   Hyperkalemia 03/08/2021   Hypomagnesemia 03/08/2021   GERD (gastroesophageal reflux disease) 03/08/2021   Acute CVA (cerebrovascular accident) ([place]) 03/08/2021   COVID-19 01/01/2021   Sensorineural hearing loss (SNHL) of both ears 05/15/2017   Tinnitus, bilateral 05/15/2017   Cough 06/08/2015   Macular degeneration of both eyes 01/21/2014   Tobacco abuse, 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/08/2013   CAD (coronary artery disease)    Pericardial tamponade 09/17/2012   Acute pericarditis, unspecified 09/13/2012   Adenocarcinoma of left lung, stage 1 ([place]) 07/25/2011   Cancer of prostate w/med recur  risk (T2b-c or Gleason 7 or PSA 10-20) ([place]) 07/25/2011   Nonmelanoma skin cancer 07/25/2011   History of colonic polyps 07/25/2011   Type 2 diabetes mellitus with vascular disease ([place]) 05/21/2007   HYPERCHOLESTEROLEMIA 05/21/2007   CARCINOMA, SKIN, SQUAMOUS CELL 01/08/2007   Generalized anxiety disorder 01/08/2007   ALZHEIMER'S DISEASE, MILD 01/08/2007   Essential hypertension 01/08/2007    Carey Bullocks, OTR/L 05/06/2021, 9:39 AM  [facility] [address] [place], [place], [address] Phone: [phone]   Fax:  [phone]  Name: [name] MRN: [id number] Date of Birth: 1931/02/10

## 2021-05-06 NOTE — Therapy (Signed)
[facility] [address] [place], [place], [address] Phone: [phone]   Fax:  [phone]  Physical Therapy Treatment  Patient Details  Name: [name] MRN: [id number] Date of Birth: 1930/08/29 Referring Provider (PT): Cathlyn Parsons, PA-C   Encounter Date: 05/06/2021   PT End of Session - 05/06/21 0933     Visit Number 6    Number of Visits 16    Date for PT Re-Evaluation 06/09/21    Authorization Type [employer]    PT Start Time 0930    PT Stop Time 1013    PT Time Calculation (min) 43 min    Equipment Utilized During Treatment Gait belt    Activity Tolerance Patient tolerated treatment well    Behavior During Therapy [facility] for tasks assessed/performed             Past Medical History:  Diagnosis Date   Adenocarcinoma of left lung, stage 1 ([place]) 2001   T1N0 stage I  adenoca left lung resected 01/03/00    Alzheimer's disease ([place])    CAD (coronary artery disease) 2014   a. 09/2012 Cath: LM nl, LAD 50-60p, D1 60-70 m, D1 50ost, LCX nl, RCA min irregs, EF 55-65%.   Generalized anxiety disorder 01/08/2007   Qualifier: Diagnosis of  By: Diona Browner MD, [name]     History of colonic polyps 07/25/2011   Macular degeneration of both eyes 01/21/2014   Nonmelanoma skin cancer 07/25/2011   Multiple lesions excised face/nose   Pericarditis    a. 09/2012 with effusion and tamponade, s/p window.  b.  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 SCC IN SITU TX CX3   SCC (squamous cell carcinoma) 04/12/2005   RIGHT NECK SUP. SCC IN SITU TX EXC   SCC (squamous cell carcinoma) 04/16/2013   LEFT TEMPLE SCC IN SITU TX CX3 5FU   SCC (squamous cell carcinoma) 04/16/2013   BELOW LEFT EYE SCC IN SITU TX CX3 5FU   SCC (squamous cell carcinoma) 04/16/2013   BELOW RIGHT EYE SCC IN SITU TX CX3 5FU   SCC (squamous cell carcinoma) 08/04/2014   LEFT CHEEK SCC IN SITU TX CX3 5FU   SCC (squamous cell carcinoma) 11/27/2018   LEFT TEMPLE SCC IN SITU TX WITH BX   SCC (squamous cell carcinoma)    SCC (squamous cell carcinoma)    SCC (squamous cell carcinoma)    SCC (squamous cell carcinoma) Well Diff 09/13/2016   Tip of Nose SCC WELL DIFF TX (MOH's), and RIGHT INNER EYE SUP. SCC IN SITU TX TO WATCH   SCC (squamous cell carcinoma) Well Diff 05/30/2017   Right Cheekbone (Cx3,5FU) and Under Left Eye (Cx3,5FU)   Squamous cell carcinoma  in situ (SCCIS) 04/12/2005   Right Neck Inf (Cx3), Right Neck Sup (Exc), and Bulb of Nose (Cx3,Exc)   Squamous cell carcinoma in situ (SCCIS) 09/27/2005   Nose (Cx3,Exc)   Squamous cell carcinoma in situ (SCCIS) 02/18/2013   Below Left Eye (tx p bx)   Squamous cell carcinoma in situ (SCCIS) 04/16/2013   Left Temple (Cx3,5FU), Below Left Eye (Cx3,5FU), Below Right Eye (Cx3,5FU)   Squamous cell carcinoma in situ (SCCIS) 08/04/2014   Left Cheek (Cx3,5FU)   Squamous cell carcinoma in situ (SCCIS) 09/13/2016   Right Inner Eye Sup. 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   Type 2 diabetes  mellitus with vascular disease ([place]) 05/21/2007   Qualifier: Diagnosis of  By: Diona Browner MD, [name]     Type 2 DM with CKD stage 3 and hypertension ([place]) 07/16/2015   Unspecified essential hypertension     Past Surgical History:  Procedure Laterality Date   HERNIA REPAIR     LEFT HEART CATHETERIZATION WITH CORONARY ANGIOGRAM N/A 09/13/2012   Procedure: LEFT HEART CATHETERIZATION WITH CORONARY ANGIOGRAM;  Surgeon: Sherren Mocha, MD;  Location: [facility] [place] CATH LAB;  Service: Cardiovascular;  Laterality: N/A;   LOBECTOMY  2001   upper left   PERICARDIAL TAP N/A 09/16/2012   Procedure: PERICARDIAL TAP;  Surgeon: Sinclair Grooms, MD;  Location[facility] CATH LAB;  Service: Cardiovascular;  Laterality: N/A;   PROSTATECTOMY  2003   SUBXYPHOID PERICARDIAL WINDOW N/A 09/16/2012   Procedure: SUBXYPHOID PERICARDIAL WINDOW;  Surgeon: Rexene Alberts, MD;  Location: [facility];  Service: Thoracic;  Laterality: N/A;   TONSILLECTOMY      Vitals:   05/06/21 0935  BP: 134/68  Pulse: 80     Subjective Assessment - 05/06/21 0933     Subjective No new changes to report. No new falls, reports he has been lucky. Is having some soreness in the R arm.    Patient is accompained by: Family member    Pertinent History R pontine CVA 03/07/21. History of Alzheimer's dementia maintained on Aricept type 2 diabetes mellitus hypertension CKD stage III macular degeneration adenocarcinoma left lung with upper lobe resection 2001 prostate cancer with prostatectomy    Limitations Walking;Standing;House hold activities    How long can you sit comfortably? n/a    How long can you stand comfortably? As long as holding on to something    How long can you walk comfortably? Mostly in the house    Patient Stated Goals Pt: walk again without RW. Family: pt amb ind to bathroom (currently requires assist for safety)    Currently in Pain? No/denies                 [facility] Adult PT Treatment/Exercise - 05/06/21 0001       Transfers    Transfers Sit to Stand;Stand to Sit    Sit to Stand 5: Supervision    Stand to Sit 5: Supervision    Number of Reps 10 reps;2 sets    Comments Completed first set with feet equal x 10 reps, then second set completed with staggered stance (LLE posterior) x 10 reps. mild SOB. BP after activities: 148/72,  HR: 96      Ambulation/Gait   Ambulation/Gait Yes    Ambulation/Gait Assistance 5: Supervision;4: Min guard    Ambulation/Gait Assistance Details Completed ambulation with RW throughout session. In // bars completed ambulation without AD working on equal step length and weight shift    Ambulation Distance (Feet) 10 Feet   x 6; plus additional clinic distance   Assistive device Rolling walker    Gait Pattern Step-through pattern;Decreased dorsiflexion - left;Decreased stance time - left;Lateral trunk lean to right;Poor foot clearance - left    Ambulation Surface Level;Indoor      Exercises   Exercises Knee/Hip      Knee/Hip Exercises: Aerobic   Nustep Completed NuStep with BLE/BUE on Level 4 x 2 minutes, followed by 1 minute rest break with additional 2 minutes. Patient did have mild SOB noted, but sp02: 98%, HR: 80 Cues to try to keep steps per minute >/= 50.                 Balance Exercises - 05/06/21 0001       Balance Exercises: Standing   SLS with Vectors Solid surface;Intermittent upper extremity assist;Limitations    SLS with Vectors Limitations standing on firm surface completed alternating toe taps to 6" step starting with BUE support progressing to single UE support then to no UE support, CGA required without support completed x 10 reps bilat. rest break required after completion.    Stepping Strategy Anterior;Limitations;5 reps    Stepping Strategy Limitations standing on firm surface without UE support, working on weight shift standing tall through stance leg and stepping forward/backwawrd x 5 reps bilat    Retro Gait Upper extremity support;Limitations    Retro Gait  Limitations with UE support from // bars, completed retro walking working on weight shift and step length, cues to stand tall through BLE, x 3 laps.    Other Standing Exercises Comments intermittent seated rest breaks required due to fatigue.                  PT Short Term Goals - 04/14/21 1512       PT SHORT TERM GOAL #1   Title Pt will perform exercises at home at least 3x/wk with supervision as needed    Time 4    Period Weeks    Status New    Target Date 05/12/21      PT SHORT TERM GOAL #2   Title Pt will have improved 5x STS to </=13 sec    Baseline 15.28 sec on eval    Time 4    Period Weeks    Status New    Target Date 05/12/21      PT SHORT TERM GOAL #3   Title Pt will be mod independent with ambulating to/from bathroom for safer home mobility    Time 4    Period Weeks    Status New    Target Date 05/12/21      PT SHORT TERM GOAL #4   Title Pt will have improved Berg Balance Score to at least 42/56    Baseline 36/56    Time 4    Period Weeks    Status New    Target Date 05/12/21      PT SHORT TERM GOAL #5   Title Pt will demo improved TUG score to </=20 sec for decreased fall risk    Baseline 25.09 sec on eval    Time 4    Period  Weeks    Status New    Target Date 05/12/21               PT Long Term Goals - 04/14/21 1515       PT LONG TERM GOAL #1   Title Pt will be independent with maintaining exercises at home    Time 8    Period Weeks    Status New    Target Date 06/09/21      PT LONG TERM GOAL #2   Title Pt will be able to amb with LRAD mod I x 800' for safe limited community amb    Time 8    Period Weeks    Status New    Target Date 06/09/21      PT LONG TERM GOAL #3   Title Pt will demo improved Berg Balance Score of at least 45/56 for decreased fall risk    Time 8    Period Weeks    Status New    Target Date 06/09/21      PT LONG TERM GOAL #4   Title Pt will have improved DGI to at least 19/24 to demo decreased fall  risk    Baseline 13/24    Time 8    Period Weeks    Status New    Target Date 06/09/21      PT LONG TERM GOAL #5   Title Pt will have improved FOTO score of 65    Baseline 49    Time 8    Period Weeks    Status New    Target Date 06/09/21                   Plan - 05/06/21 0939     Clinical Impression Statement Continued strengthening and balance activities througohut today's session. Trialed NuStep with patient having some mild SOB noted but vitals stable. Intermittent rest breaks required, but continued to progress activiites to patient's tolerance. Will continue per POC.    Personal Factors and Comorbidities Age;Fitness;Time since onset of injury/illness/exacerbation    Examination-Activity Limitations Locomotion Level;Bathing;Squat;Stairs;Stand;Toileting;Transfers;Bed Mobility;Caring for Others    Examination-Participation Restrictions Meal Prep;Cleaning;Community Activity;Laundry;Shop    Stability/Clinical Decision Making Evolving/Moderate complexity    Rehab Potential Good    PT Frequency 2x / week    PT Duration 8 weeks    PT Treatment/Interventions ADLs/Self Care Home Management;Aquatic Therapy;Electrical Stimulation;Moist Heat;DME Instruction;Gait training;Stair training;Functional mobility training;Therapeutic activities;Therapeutic exercise;Balance training;Neuromuscular re-education;Patient/family education;Orthotic Fit/Training;Manual techniques;Passive range of motion;Dry needling;Taping    PT Next Visit Plan STGs due next week. work on hip core strengtheing, standing balance, gait    PT Home Exercise Plan Access Code [email or web address]    Consulted and Agree with Plan of Care Patient;Family member/caregiver             Patient will benefit from skilled therapeutic intervention in order to improve the following deficits and impairments:  Abnormal gait, Decreased coordination, Impaired tone, Decreased endurance, Impaired UE functional use, Decreased activity  tolerance, Decreased balance, Improper body mechanics, Decreased mobility, Decreased strength, Postural dysfunction  Visit Diagnosis: Hemiplegia and hemiparesis following cerebral infarction affecting left non-dominant side (HCC)  Difficulty in walking, not elsewhere classified  Other abnormalities of gait and mobility  Unsteadiness on feet     Problem List Patient Active Problem List   Diagnosis Date Noted   Right pontine cerebrovascular accident ([place]) 03/12/2021   Acute ischemic stroke ([place]) 03/08/2021   Hyperkalemia 03/08/2021   Hypomagnesemia 03/08/2021   GERD (  gastroesophageal reflux disease) 03/08/2021   Acute CVA (cerebrovascular accident) ([place]) 03/08/2021   COVID-19 01/01/2021   Sensorineural hearing loss (SNHL) of both ears 05/15/2017   Tinnitus, bilateral 05/15/2017   Cough 06/08/2015   Macular degeneration of both eyes 01/21/2014   Tobacco abuse, 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/08/2013   CAD (coronary artery disease)    Pericardial tamponade 09/17/2012   Acute pericarditis, unspecified 09/13/2012   Adenocarcinoma of left lung, stage 1 ([place]) 07/25/2011   Cancer of prostate w/med recur risk (T2b-c or Gleason 7 or PSA 10-20) ([place]) 07/25/2011   Nonmelanoma skin cancer 07/25/2011   History of colonic polyps 07/25/2011   Type 2 diabetes mellitus with vascular disease ([place]) 05/21/2007   HYPERCHOLESTEROLEMIA 05/21/2007   CARCINOMA, SKIN, SQUAMOUS CELL 01/08/2007   Generalized anxiety disorder 01/08/2007   ALZHEIMER'S DISEASE, MILD 01/08/2007   Essential hypertension 01/08/2007    Jones Bales, PT, DPT 05/06/2021, 10:13 AM  [facility] [address] [place], [place], [address] Phone: [phone]   Fax:  [phone]  Name: [name] MRN: [id number] Date of Birth: 10-Dec-1930

## 2021-05-11 ENCOUNTER — Ambulatory Visit: Payer: Medicare HMO | Admitting: Occupational Therapy

## 2021-05-11 ENCOUNTER — Other Ambulatory Visit: Payer: Self-pay

## 2021-05-11 ENCOUNTER — Other Ambulatory Visit: Payer: Self-pay | Admitting: Family Medicine

## 2021-05-11 ENCOUNTER — Ambulatory Visit: Payer: Medicare HMO | Admitting: Physical Therapy

## 2021-05-11 DIAGNOSIS — R278 Other lack of coordination: Secondary | ICD-10-CM | POA: Diagnosis not present

## 2021-05-11 DIAGNOSIS — R262 Difficulty in walking, not elsewhere classified: Secondary | ICD-10-CM | POA: Diagnosis not present

## 2021-05-11 DIAGNOSIS — R41840 Attention and concentration deficit: Secondary | ICD-10-CM

## 2021-05-11 DIAGNOSIS — R41842 Visuospatial deficit: Secondary | ICD-10-CM

## 2021-05-11 DIAGNOSIS — I69354 Hemiplegia and hemiparesis following cerebral infarction affecting left non-dominant side: Secondary | ICD-10-CM

## 2021-05-11 DIAGNOSIS — M6281 Muscle weakness (generalized): Secondary | ICD-10-CM | POA: Diagnosis not present

## 2021-05-11 DIAGNOSIS — R2681 Unsteadiness on feet: Secondary | ICD-10-CM

## 2021-05-11 DIAGNOSIS — R2689 Other abnormalities of gait and mobility: Secondary | ICD-10-CM | POA: Diagnosis not present

## 2021-05-11 NOTE — Therapy (Signed)
[facility] [address] [place], [place], [address] Phone: [phone]   Fax:  [phone]  Physical Therapy Treatment  Patient Details  Name: [name] MRN: [id number] Date of Birth: [birth year] Referring Provider (PT): Cathlyn Parsons, PA-C   Encounter Date: 05/11/2021   PT End of Session - 05/11/21 1108     Visit Number 7    Number of Visits 16    Date for PT Re-Evaluation 06/09/21    Authorization Type A[employer]na Medicare    PT Start Time 1100    PT Stop Time 2353    PT Time Calculation (min) 45 min    Equipment Utilized During Treatment Gait belt    Activity Tolerance Patient tolerated treatment well    Behavior During Therapy [facility] for tasks assessed/performed             Past Medical History:  Diagnosis Date   Adenocarcinoma of left lung, stage 1 ([place]) 2001   T1N0 stage I  adenoca left lung resected 01/03/00    Alzheimer's disease ([place])    CAD (coronary artery disease) 2014   a. 09/2012 Cath: LM nl, LAD 50-60p, D1 60-70 m, D1 50ost, LCX nl, RCA min irregs, EF 55-65%.   Generalized anxiety disorder 01/08/2007   Qualifier: Diagnosis of  By: Diona Browner MD, [name]     History of colonic polyps 07/25/2011   Macular degeneration of both eyes 01/21/2014   Nonmelanoma skin cancer 07/25/2011   Multiple lesions excised face/nose   Pericarditis    a. 09/2012 with effusion and tamponade, s/p window.  b.  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 SCC IN SITU TX CX3   SCC (squamous cell carcinoma) 04/12/2005   RIGHT NECK SUP. SCC IN SITU TX EXC   SCC (squamous cell carcinoma) 04/16/2013   LEFT TEMPLE SCC IN SITU TX CX3 5FU   SCC (squamous cell carcinoma) 04/16/2013   BELOW LEFT EYE SCC IN SITU TX CX3 5FU   SCC (squamous cell carcinoma) 04/16/2013   BELOW RIGHT EYE SCC IN SITU TX CX3 5FU   SCC (squamous cell carcinoma) 08/04/2014   LEFT CHEEK SCC IN SITU TX CX3 5FU   SCC (squamous cell carcinoma) 11/27/2018   LEFT TEMPLE SCC IN SITU TX WITH BX   SCC (squamous cell carcinoma)    SCC (squamous cell carcinoma)    SCC (squamous cell carcinoma)    SCC (squamous cell carcinoma) Well Diff 09/13/2016   Tip of Nose SCC WELL DIFF TX (MOH's), and RIGHT INNER EYE SUP. SCC IN SITU TX TO WATCH   SCC (squamous cell carcinoma) Well Diff 05/30/2017   Right Cheekbone (Cx3,5FU) and Under Left Eye (Cx3,5FU)   Squamous cell carcinoma  in situ (SCCIS) 04/12/2005   Right Neck Inf (Cx3), Right Neck Sup (Exc), and Bulb of Nose (Cx3,Exc)   Squamous cell carcinoma in situ (SCCIS) 09/27/2005   Nose (Cx3,Exc)   Squamous cell carcinoma in situ (SCCIS) 02/18/2013   Below Left Eye (tx p bx)   Squamous cell carcinoma in situ (SCCIS) 04/16/2013   Left Temple (Cx3,5FU), Below Left Eye (Cx3,5FU), Below Right Eye (Cx3,5FU)   Squamous cell carcinoma in situ (SCCIS) 08/04/2014   Left Cheek (Cx3,5FU)   Squamous cell carcinoma in situ (SCCIS) 09/13/2016   Right Inner Eye Sup. 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   Type 2 diabetes  mellitus with vascular disease ([place]) 05/21/2007   Qualifier: Diagnosis of  By: Diona Browner MD, [name]     Type 2 DM with CKD stage 3 and hypertension ([place]) 07/16/2015   Unspecified essential hypertension     Past Surgical History:  Procedure Laterality Date   HERNIA REPAIR     LEFT HEART CATHETERIZATION WITH CORONARY ANGIOGRAM N/A 09/13/2012   Procedure: LEFT HEART CATHETERIZATION WITH CORONARY ANGIOGRAM;  Surgeon: Sherren Mocha, MD;  Location: [facility] CATH LAB;  Service: Cardiovascular;  Laterality: N/A;   LOBECTOMY  2001   upper left   PERICARDIAL TAP N/A 09/16/2012   Procedure: PERICARDIAL TAP;  Surgeon: Sinclair Grooms, MD;  Location: [facility] CATH LAB;  Service: Cardiovascular;  Laterality: N/A;   PROSTATECTOMY  2003   SUBXYPHOID PERICARDIAL WINDOW N/A 09/16/2012   Procedure: SUBXYPHOID PERICARDIAL WINDOW;  Surgeon: Rexene Alberts, MD;  Location: [facility];  Service: Thoracic;  Laterality: N/A;   TONSILLECTOMY      There were no vitals filed for this visit.   Subjective Assessment - 05/11/21 1108     Subjective Nothing new to report. He states he was a little sore after last session. He states he's been doing some of his exercises.    Patient is accompained by: Family member    Pertinent History R pontine CVA 03/07/21. History of Alzheimer's dementia maintained on Aricept type 2 diabetes mellitus hypertension CKD stage III macular degeneration adenocarcinoma left lung with upper lobe resection 2001 prostate cancer with prostatectomy    Limitations Walking;Standing;House hold activities    How long can you sit comfortably? n/a    How long can you stand comfortably? As long as holding on to something    How long can you walk comfortably? Mostly in the house    Patient Stated Goals Pt: walk again without RW. Family: pt amb ind to bathroom (currently requires assist for safety)                  Single leg alternating bridge 2x10 Standing box taps: 6" box cues to engage glute/keep hips forward  2 x 10 R and L in // bars:  Step forward/back L&R x10 with 1 hand hold, x10 without hand hold  Amb with 1 hand hold 4x10' Side step over yard stick x10 L & R first with 2 hand holds, x10 with no hand hold                       PT  Short Term Goals - 04/14/21 1512       PT SHORT TERM GOAL #1   Title Pt will perform exercises at home at least 3x/wk with supervision as needed    Time 4    Period Weeks    Status New    Target Date 05/12/21      PT SHORT TERM GOAL #2   Title Pt will have improved 5x STS to </=13 sec    Baseline 15.28 sec on eval    Time 4    Period Weeks    Status New    Target Date 05/12/21      PT SHORT TERM GOAL #3   Title Pt will be mod independent with ambulating to/from bathroom for safer home mobility    Time 4    Period Weeks    Status New    Target Date 05/12/21      PT SHORT TERM GOAL #4   Title Pt will have improved Berg Balance Score to at least 42/56    Baseline 36/56    Time 4    Period Weeks    Status New    Target Date 05/12/21      PT SHORT TERM GOAL #5   Title Pt will demo improved TUG score to </=20 sec for decreased fall risk    Baseline 25.09 sec on eval    Time 4    Period Weeks    Status New    Target Date 05/12/21               PT Long Term Goals - 04/14/21 1515       PT LONG TERM GOAL #1   Title Pt will be independent with maintaining exercises at home    Time 8    Period Weeks    Status New    Target Date 06/09/21      PT LONG TERM GOAL #2   Title Pt will be able to amb with LRAD mod I x 800' for safe limited community amb    Time 8    Period Weeks    Status New    Target Date 06/09/21      PT LONG TERM GOAL #3   Title Pt will demo improved Berg Balance Score of at least 45/56 for decreased fall risk    Time 8    Period Weeks    Status New    Target Date 06/09/21      PT LONG TERM GOAL #4   Title Pt will have improved DGI to at least 19/24 to demo decreased fall risk    Baseline  13/24    Time 8    Period Weeks    Status New    Target Date 06/09/21      PT LONG TERM GOAL #5   Title Pt will have improved FOTO score of 65    Baseline 49    Time 8    Period Weeks    Status New    Target Date 06/09/21                   Plan - 05/11/21 1135     Clinical Impression Statement Treatment focused on continuing to progress pt's static and dynamic balance. Continued to work on side stepping strategies over yard stick. Able to amb with 1 hand hold on // bars this session.    Personal Factors and Comorbidities Age;Fitness;Time since onset of injury/illness/exacerbation  Examination-Activity Limitations Locomotion Level;Bathing;Squat;Stairs;Stand;Toileting;Transfers;Bed Mobility;Caring for Others    Examination-Participation Restrictions Meal Prep;Cleaning;Community Activity;Laundry;Shop    Stability/Clinical Decision Making Evolving/Moderate complexity    Rehab Potential Good    PT Frequency 2x / week    PT Duration 8 weeks    PT Treatment/Interventions ADLs/Self Care Home Management;Aquatic Therapy;Electrical Stimulation;Moist Heat;DME Instruction;Gait training;Stair training;Functional mobility training;Therapeutic activities;Therapeutic exercise;Balance training;Neuromuscular re-education;Patient/family education;Orthotic Fit/Training;Manual techniques;Passive range of motion;Dry needling;Taping    PT Next Visit Plan STGs due next week. work on hip core strengtheing, standing balance, gait    PT Home Exercise Plan Access Code [email or web address]    Consulted and Agree with Plan of Care Patient;Family member/caregiver             Patient will benefit from skilled therapeutic intervention in order to improve the following deficits and impairments:  Abnormal gait, Decreased coordination, Impaired tone, Decreased endurance, Impaired UE functional use, Decreased activity tolerance, Decreased balance, Improper body mechanics, Decreased mobility, Decreased strength,  Postural dysfunction  Visit Diagnosis: Hemiplegia and hemiparesis following cerebral infarction affecting left non-dominant side (HCC)  Difficulty in walking, not elsewhere classified  Other abnormalities of gait and mobility  Unsteadiness on feet  Muscle weakness (generalized)     Problem List Patient Active Problem List   Diagnosis Date Noted   Right pontine cerebrovascular accident ([place]) 03/12/2021   Acute ischemic stroke ([place]) 03/08/2021   Hyperkalemia 03/08/2021   Hypomagnesemia 03/08/2021   GERD (gastroesophageal reflux disease) 03/08/2021   Acute CVA (cerebrovascular accident) ([place]) 03/08/2021   COVID-19 01/01/2021   Sensorineural hearing loss (SNHL) of both ears 05/15/2017   Tinnitus, bilateral 05/15/2017   Cough 06/08/2015   Macular degeneration of both eyes 01/21/2014   Tobacco abuse, 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/08/2013   CAD (coronary artery disease)    Pericardial tamponade 09/17/2012   Acute pericarditis, unspecified 09/13/2012   Adenocarcinoma of left lung, stage 1 ([place]) 07/25/2011   Cancer of prostate w/med recur risk (T2b-c or Gleason 7 or PSA 10-20) ([place]) 07/25/2011   Nonmelanoma skin cancer 07/25/2011   History of colonic polyps 07/25/2011   Type 2 diabetes mellitus with vascular disease ([place]) 05/21/2007   HYPERCHOLESTEROLEMIA 05/21/2007   CARCINOMA, SKIN, SQUAMOUS CELL 01/08/2007   Generalized anxiety disorder 01/08/2007   ALZHEIMER'S DISEASE, MILD 01/08/2007   Essential hypertension 01/08/2007    Telesia Ates April Gordy Levan, PT, DPT 05/11/2021, 11:45 AM  [facility] [address] [place], [place], [address] Phone: [phone]   Fax:  [phone]  Name: [name] MRN: [id number] Date of Birth: Jan 09, 1931

## 2021-05-11 NOTE — Therapy (Signed)
[facility] [address] [place], [place], [address] Phone: [phone]   Fax:  [phone]  Occupational Therapy Treatment  Patient Details  Name: [name] MRN: [id number] Date of Birth: Jan 04, 1931 No data recorded  Encounter Date: 05/11/2021   OT End of Session - 05/11/21 1210     Visit Number 7    Number of Visits 25    Date for OT Re-Evaluation 07/08/21    Authorization Type Aetna Medicare    Authorization - Visit Number 7    Progress Note Due on Visit 10    OT Start Time 1015    OT Stop Time 1100    OT Time Calculation (min) 45 min    Activity Tolerance Patient tolerated treatment well    Behavior During Therapy [facility] for tasks assessed/performed             Past Medical History:  Diagnosis Date   Adenocarcinoma of left lung, stage 1 ([place]) 2001   T1N0 stage I  adenoca left lung resected 01/03/00    Alzheimer's disease ([place])    CAD (coronary artery disease) 2014   a. 09/2012 Cath: LM nl, LAD 50-60p, D1 60-70 m, D1 50ost, LCX nl, RCA min irregs, EF 55-65%.   Generalized anxiety disorder 01/08/2007   Qualifier: Diagnosis of  By: Diona Browner MD, [name]     History of colonic polyps 07/25/2011   Macular degeneration of both eyes 01/21/2014   Nonmelanoma skin cancer 07/25/2011   Multiple lesions excised face/nose   Pericarditis    a. 09/2012 with effusion and tamponade, s/p window.  b.  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 SCC IN SITU TX CX3   SCC (squamous cell carcinoma) 04/12/2005   RIGHT NECK SUP. SCC IN SITU TX EXC   SCC (squamous cell carcinoma) 04/16/2013   LEFT TEMPLE SCC IN SITU TX CX3 5FU   SCC (squamous cell carcinoma) 04/16/2013   BELOW LEFT EYE SCC IN SITU TX CX3 5FU   SCC (squamous cell carcinoma) 04/16/2013   BELOW RIGHT EYE SCC IN SITU TX CX3 5FU   SCC (squamous cell carcinoma) 08/04/2014   LEFT CHEEK SCC IN SITU TX CX3 5FU   SCC (squamous cell carcinoma) 11/27/2018   LEFT TEMPLE SCC IN SITU TX WITH BX   SCC (squamous cell carcinoma)    SCC (squamous cell carcinoma)    SCC (squamous cell carcinoma)    SCC (squamous cell carcinoma) Well Diff 09/13/2016   Tip of Nose SCC WELL DIFF TX (MOH's), and RIGHT INNER EYE SUP. SCC IN SITU TX TO WATCH   SCC (squamous cell carcinoma) Well Diff 05/30/2017   Right Cheekbone (Cx3,5FU) and Under Left Eye (Cx3,5FU)  Squamous cell carcinoma in situ (SCCIS) 04/12/2005   Right Neck Inf (Cx3), Right Neck Sup (Exc), and Bulb of Nose (Cx3,Exc)   Squamous cell carcinoma in situ (SCCIS) 09/27/2005   Nose (Cx3,Exc)   Squamous cell carcinoma in situ (SCCIS) 02/18/2013   Below Left Eye (tx p bx)   Squamous cell carcinoma in situ (SCCIS) 04/16/2013   Left Temple (Cx3,5FU), Below Left Eye (Cx3,5FU), Below Right Eye (Cx3,5FU)   Squamous cell carcinoma in situ (SCCIS) 08/04/2014   Left Cheek (Cx3,5FU)   Squamous cell carcinoma in situ (SCCIS) 09/13/2016   Right Inner Eye Sup. 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   Type 2 diabetes mellitus with  vascular disease ([place]) 05/21/2007   Qualifier: Diagnosis of  By: Diona Browner MD, [name]     Type 2 DM with CKD stage 3 and hypertension ([place]) 07/16/2015   Unspecified essential hypertension     Past Surgical History:  Procedure Laterality Date   HERNIA REPAIR     LEFT HEART CATHETERIZATION WITH CORONARY ANGIOGRAM N/A 09/13/2012   Procedure: LEFT HEART CATHETERIZATION WITH CORONARY ANGIOGRAM;  Surgeon: Sherren Mocha, MD;  [facility] CATH LAB;  Service: Cardiovascular;  Laterality: N/A;   LOBECTOMY  2001   upper left   PERICARDIAL TAP N/A 09/16/2012   Procedure: PERICARDIAL TAP;  Surgeon: Sinclair Grooms, MD;  [facility] CATH LAB;  Service: Cardiovascular;  Laterality: N/A;   PROSTATECTOMY  2003   SUBXYPHOID PERICARDIAL WINDOW N/A 09/16/2012   Procedure: SUBXYPHOID PERICARDIAL WINDOW;  Surgeon: Rexene Alberts, MD;  Location: [facility];  Service: Thoracic;  Laterality: N/A;   TONSILLECTOMY      There were no vitals filed for this visit.   Subjective Assessment - 05/11/21 1021     Subjective  My Rt shoulder hurts    Pertinent History Pt is an 88 year old man admitted on 03/07/21 with L side weakness. MRI + for small foci ischemia R pons and L frontal periventricular white matter. PMH. Alzheimers dementia, DM, HTN, stage 3 CKD, lung ca, prostate ca, multiple skin cancers, CAD, anxiety, macular degeneration. Pt lives with wife who has hired caregiver, children currently assisting    Limitations HOH, macular degeneration    Special Tests Goes by [name].    Patient Stated Goals get left  arm working, increase independence    Currently in Pain? Yes    Pain Score 3     Pain Location Shoulder    Pain Orientation Right    Pain Descriptors / Indicators Sore    Pain Type Acute pain    Pain Frequency Intermittent    Aggravating Factors  certain movements, overcompensation from weaker LUE    Pain Relieving Factors rest            Supine: BUE sh flexion with foam roll (palms facing). Progressed to  same activity with 2 lb dumbbell.   Seated: worked on functional mid level reaching w/ cones w/ emphasis on fully opening hand to get around cone and to fully release cone. Pt has improved with Lt ring MP extension w/ wrist neutral, however difficult to maintain w/ wrist extension Progressed to mid level reaching  to place rubber washers on lower prongs LUE.   Standing: worked on forward and side stepping with weight shifts in prep for dynamic standing tasks and IADLS.   UBE x 5 min, level 1 for reciprocal movement UE's                        OT Short Term Goals - 05/06/21 0933       OT SHORT TERM GOAL #1   Title I with inital HEP.    Time 4    Period Weeks    Status Achieved    Target Date 05/13/21      OT SHORT TERM GOAL #2   Title Pt will donn shirt with set up and supervision and pants with min A.    Time 4    Period Weeks    Status Achieved      OT SHORT TERM GOAL #3   Title Pt will demonstrate 90* shoulder flexion in prep for functional reach with LUE with no more than min compensations.    Time 4    Period Weeks    Status On-going      OT SHORT TERM GOAL #4   Title Pt will verbalize understanding of memory compensation strategies.    Time 4    Period Weeks    Status On-going      OT SHORT TERM GOAL #5   Title Pt will demonstrate improved LUE functional use as evidenced by increasing box/ blocks score by 5 blocks    Baseline R 43 blocks, LUE 28 blocks    Time 4    Period Weeks    Status On-going      OT SHORT TERM GOAL #6   Title Pt will verbalize understanding of adapted strategies to maximize safety and I with ADLs/ IADLs .    (YT:[id number] food)    Time 4    Period Weeks    Status On-going               OT Long Term Goals - 04/21/21 1611       OT LONG TERM GOAL #1   Title Pt will perform all basic ADLS with supervision.    Time 12    Period Weeks    Status On-going    Target Date 07/08/21      OT LONG TERM GOAL #2   Title  Pt will demonstrate ability to retrieve a lightweight object at 100* with LUE    Time 12    Period Weeks    Status On-going      OT LONG TERM GOAL #3   Title Pt will demonstrate improved fine motor coordination for ADLs  as evidenced by placing 9 pegs in 90 secs or less for 9 hole peg test.    Time 12    Period Weeks    Status On-going      OT LONG TERM GOAL #4   Title Pt will increase LUE grip strength by 5 lbs for increased LUE functional use.    Baseline RUE 65.9 LUE 33.7    Time 12    Period Weeks    Status On-going      OT LONG TERM GOAL #5   Title Pt will perform simple beverage prep with supervision.    Time 12    Period Weeks    Status On-going  Plan - 05/11/21 1211     Clinical Impression Statement Pt improving in LUE function; pt able to consistently perform mid level reaching now, but fatigues and needs rest breaks. Pt also w/ improvement in Lt ring finger MP extension    OT Occupational Profile and History Detailed Assessment- Review of Records and additional review of physical, cognitive, psychosocial history related to current functional performance    Occupational performance deficits (Please refer to evaluation for details): ADL's;IADL's;Rest and Sleep;Leisure;Social Participation    Body Structure / Function / Physical Skills ADL;UE functional use;Endurance;Balance;Flexibility;Pain;Vision;FMC;Gait;ROM;GMC;Coordination;Decreased knowledge of precautions;Decreased knowledge of use of DME;IADL;Strength;Dexterity;Mobility    Rehab Potential Good    Clinical Decision Making Several treatment options, min-mod task modification necessary   visual and cognitive deficits, decrease coordiantion   Comorbidities Affecting Occupational Performance: May have comorbidities impacting occupational performance   Alzheimer's, macular degeneration, CVA   Modification or Assistance to Complete Evaluation  Min-Moderate modification of tasks or assist with  assess necessary to complete eval   visual deficits, extra cueing and  assist required   OT Frequency 2x / week    OT Duration 12 weeks    OT Treatment/Interventions Self-care/ADL training;Ultrasound;Visual/perceptual remediation/compensation;Patient/family education;DME and/or AE instruction;Aquatic Therapy;Paraffin;Passive range of motion;Balance training;Gait Training;Stair Training;Fluidtherapy;Cryotherapy;Electrical Stimulation;Therapist, nutritional;Therapeutic activities;Manual Therapy;Therapeutic exercise;Splinting;Moist Heat;Neuromuscular education;Cognitive remediation/compensation    Plan functional reaching in standing for dynamic standing balance (use gait belt)    Consulted and Agree with Plan of Care Patient             Patient will benefit from skilled therapeutic intervention in order to improve the following deficits and impairments:   Body Structure / Function / Physical Skills: ADL, UE functional use, Endurance, Balance, Flexibility, Pain, Vision, FMC, Gait, ROM, GMC, Coordination, Decreased knowledge of precautions, Decreased knowledge of use of DME, IADL, Strength, Dexterity, Mobility       Visit Diagnosis: Hemiplegia and hemiparesis following cerebral infarction affecting left non-dominant side (HCC)  Attention and concentration deficit  Visuospatial deficit    Problem List Patient Active Problem List   Diagnosis Date Noted   Right pontine cerebrovascular accident ([place]) 03/12/2021   Acute ischemic stroke ([place]) 03/08/2021   Hyperkalemia 03/08/2021   Hypomagnesemia 03/08/2021   GERD (gastroesophageal reflux disease) 03/08/2021   Acute CVA (cerebrovascular accident) ([place]) 03/08/2021   COVID-19 01/01/2021   Sensorineural hearing loss (SNHL) of both ears 05/15/2017   Tinnitus, bilateral 05/15/2017   Cough 06/08/2015   Macular degeneration of both eyes 01/21/2014   Tobacco abuse, 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/08/2013   CAD (coronary artery disease)    Pericardial  tamponade 09/17/2012   Acute pericarditis, unspecified 09/13/2012   Adenocarcinoma of left lung, stage 1 ([place]) 07/25/2011   Cancer of prostate w/med recur risk (T2b-c or Gleason 7 or PSA 10-20) ([place]) 07/25/2011   Nonmelanoma skin cancer 07/25/2011   History of colonic polyps 07/25/2011   Type 2 diabetes mellitus with vascular disease ([place]) 05/21/2007   HYPERCHOLESTEROLEMIA 05/21/2007   CARCINOMA, SKIN, SQUAMOUS CELL 01/08/2007   Generalized anxiety disorder 01/08/2007   ALZHEIMER'S DISEASE, MILD 01/08/2007   Essential hypertension 01/08/2007    Carey Bullocks, OTR/L 05/11/2021, 12:16 PM  [facility] [address] [place], [place], [address] Phone: [phone]   Fax:  [phone]  Name: [name] MRN: [id number] Date of Birth: Jan 11, 1931

## 2021-05-13 ENCOUNTER — Other Ambulatory Visit: Payer: Self-pay

## 2021-05-13 ENCOUNTER — Ambulatory Visit: Payer: Medicare HMO

## 2021-05-13 ENCOUNTER — Ambulatory Visit: Payer: Medicare HMO | Admitting: Occupational Therapy

## 2021-05-13 DIAGNOSIS — R2689 Other abnormalities of gait and mobility: Secondary | ICD-10-CM

## 2021-05-13 DIAGNOSIS — R262 Difficulty in walking, not elsewhere classified: Secondary | ICD-10-CM

## 2021-05-13 DIAGNOSIS — M6281 Muscle weakness (generalized): Secondary | ICD-10-CM

## 2021-05-13 DIAGNOSIS — R278 Other lack of coordination: Secondary | ICD-10-CM | POA: Diagnosis not present

## 2021-05-13 DIAGNOSIS — R2681 Unsteadiness on feet: Secondary | ICD-10-CM

## 2021-05-13 DIAGNOSIS — I69354 Hemiplegia and hemiparesis following cerebral infarction affecting left non-dominant side: Secondary | ICD-10-CM

## 2021-05-13 DIAGNOSIS — R41842 Visuospatial deficit: Secondary | ICD-10-CM | POA: Diagnosis not present

## 2021-05-13 DIAGNOSIS — R41840 Attention and concentration deficit: Secondary | ICD-10-CM

## 2021-05-13 NOTE — Therapy (Signed)
[facility] [address] [place], [place], [address] Phone: [phone]   Fax:  [phone]  Occupational Therapy Treatment  Patient Details  Name: [name] MRN: [id number] Date of Birth: January 14, 1931 No data recorded  Encounter Date: 05/13/2021   OT End of Session - 05/13/21 1008     Visit Number 8    Number of Visits 25    Date for OT Re-Evaluation 07/08/21    Authorization Type [employer]    Authorization - Visit Number 8    Progress Note Due on Visit 10    OT Start Time 0930    OT Stop Time 1013    OT Time Calculation (min) 43 min    Activity Tolerance Patient tolerated treatment well    Behavior During Therapy [facility] for tasks assessed/performed             Past Medical History:  Diagnosis Date   Adenocarcinoma of left lung, stage 1 ([place]) 2001   T1N0 stage I  adenoca left lung resected 01/03/00    Alzheimer's disease ([place])    CAD (coronary artery disease) 2014   a. 09/2012 Cath: LM nl, LAD 50-60p, D1 60-70 m, D1 50ost, LCX nl, RCA min irregs, EF 55-65%.   Generalized anxiety disorder 01/08/2007   Qualifier: Diagnosis of  By: Diona Browner MD, [name]     History of colonic polyps 07/25/2011   Macular degeneration of both eyes 01/21/2014   Nonmelanoma skin cancer 07/25/2011   Multiple lesions excised face/nose   Pericarditis    a. 09/2012 with effusion and tamponade, s/p window.  b.  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 SCC IN SITU TX CX3   SCC (squamous cell carcinoma) 04/12/2005   RIGHT NECK SUP. SCC IN SITU TX EXC   SCC (squamous cell carcinoma) 04/16/2013   LEFT TEMPLE SCC IN SITU TX CX3 5FU   SCC (squamous cell carcinoma) 04/16/2013   BELOW LEFT EYE SCC IN SITU TX CX3 5FU   SCC (squamous cell carcinoma) 04/16/2013   BELOW RIGHT EYE SCC IN SITU TX CX3 5FU   SCC (squamous cell carcinoma) 08/04/2014   LEFT CHEEK SCC IN SITU TX CX3 5FU   SCC (squamous cell carcinoma) 11/27/2018   LEFT TEMPLE SCC IN SITU TX WITH BX   SCC (squamous cell carcinoma)    SCC (squamous cell carcinoma)    SCC (squamous cell carcinoma)    SCC (squamous cell carcinoma) Well Diff 09/13/2016   Tip of Nose SCC WELL DIFF TX (MOH's), and RIGHT INNER EYE SUP. SCC IN SITU TX TO WATCH   SCC (squamous cell carcinoma) Well Diff 05/30/2017   Right Cheekbone (Cx3,5FU) and Under Left Eye (Cx3,5FU)  Squamous cell carcinoma in situ (SCCIS) 04/12/2005   Right Neck Inf (Cx3), Right Neck Sup (Exc), and Bulb of Nose (Cx3,Exc)   Squamous cell carcinoma in situ (SCCIS) 09/27/2005   Nose (Cx3,Exc)   Squamous cell carcinoma in situ (SCCIS) 02/18/2013   Below Left Eye (tx p bx)   Squamous cell carcinoma in situ (SCCIS) 04/16/2013   Left Temple (Cx3,5FU), Below Left Eye (Cx3,5FU), Below Right Eye (Cx3,5FU)   Squamous cell carcinoma in situ (SCCIS) 08/04/2014   Left Cheek (Cx3,5FU)   Squamous cell carcinoma in situ (SCCIS) 09/13/2016   Right Inner Eye Sup. 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   Type 2 diabetes mellitus with  vascular disease ([place]) 05/21/2007   Qualifier: Diagnosis of  By: Diona Browner MD, [name]     Type 2 DM with CKD stage 3 and hypertension ([place]) 07/16/2015   Unspecified essential hypertension     Past Surgical History:  Procedure Laterality Date   HERNIA REPAIR     LEFT HEART CATHETERIZATION WITH CORONARY ANGIOGRAM N/A 09/13/2012   Procedure: LEFT HEART CATHETERIZATION WITH CORONARY ANGIOGRAM;  Surgeon: Sherren Mocha, MD;  Location: [facility] CATH LAB;  Service: Cardiovascular;  Laterality: N/A;   LOBECTOMY  2001   upper left   PERICARDIAL TAP N/A 09/16/2012   Procedure: PERICARDIAL TAP;  Surgeon: Sinclair Grooms, MD;  Location: [facility] CATH LAB;  Service: Cardiovascular;  Laterality: N/A;   PROSTATECTOMY  2003   SUBXYPHOID PERICARDIAL WINDOW N/A 09/16/2012   Procedure: SUBXYPHOID PERICARDIAL WINDOW;  Surgeon: Rexene Alberts, MD;  Location: [place] [facility];  Service: Thoracic;  Laterality: N/A;   TONSILLECTOMY      There were no vitals filed for this visit.   Subjective Assessment - 05/13/21 0934     Subjective  My Rt shoulder still hurts    Pertinent History Pt is an 88 year old man admitted on 03/07/21 with L side weakness. MRI + for small foci ischemia R pons and L frontal periventricular white matter. PMH. Alzheimers dementia, DM, HTN, stage 3 CKD, lung ca, prostate ca, multiple skin cancers, CAD, anxiety, macular degeneration. Pt lives with wife who has hired caregiver, children currently assisting    Limitations HOH, macular degeneration    Special Tests Goes by [name].    Patient Stated Goals get left  arm working, increase independence    Currently in Pain? Yes    Pain Score 3     Pain Location Shoulder    Pain Orientation Right    Pain Descriptors / Indicators Aching    Pain Type Acute pain    Pain Frequency Intermittent    Aggravating Factors  certain movements, overcompensation from weaker LUE    Pain Relieving Factors rest            Standing to place large pegs in pegboard, tabletop surface 3  rows. Progressed to placing checkers into Connect 4 slots for slighter higher reaching LUE while standing. Pt required 1 rest break to sit.   Standing at counter to reach for cones in cabinet and place on counter, then return to cabinet - pt with increased compensations LUE as pt fatigues. Therapist provided min assist  near end of task and cues to fully open hand. Pt also practiced opening drawers and lower cabinets LUE while standing  LUE AA/ROM with UE Ranger for mid to high level closed chain flexion  UBE x 5 min. Level 1 for normal reciprocal movement pattern.   Practiced donning Lt shoe (w/ AFO) with mod assist. Pt still having trouble with this. Encouraged pt to use LUE to assist in low level activities as much as able (pulling up socks, pants, folding towels/clothes, bathing, etc)                         OT Short Term Goals - 05/06/21 0933       OT SHORT TERM GOAL #1   Title I with inital HEP.    Time 4    Period Weeks    Status Achieved    Target Date 05/13/21      OT SHORT TERM GOAL #2   Title Pt will donn shirt with set up and supervision and pants with min A.    Time 4    Period Weeks    Status Achieved      OT SHORT TERM GOAL #3   Title Pt will demonstrate 90* shoulder flexion in prep for functional reach with LUE with no more than min compensations.    Time 4    Period Weeks    Status On-going      OT SHORT TERM GOAL #4   Title Pt will verbalize understanding of memory compensation strategies.    Time 4    Period Weeks    Status On-going      OT SHORT TERM GOAL #5   Title Pt will demonstrate improved LUE functional use as evidenced by increasing box/ blocks score by 5 blocks    Baseline R 43 blocks, LUE 28 blocks    Time 4    Period Weeks    Status On-going      OT SHORT TERM GOAL #6   Title Pt will verbalize understanding of adapted strategies to maximize safety and I with ADLs/ IADLs .    (YS:AYTKZSW food)    Time 4    Period Weeks     Status On-going               OT Long Term Goals - 04/21/21 1611       OT LONG TERM GOAL #1   Title Pt will perform all basic ADLS with supervision.    Time 12    Period Weeks    Status On-going    Target Date 07/08/21      OT LONG TERM GOAL #2   Title Pt will demonstrate ability to retrieve a lightweight object at 100* with LUE    Time 12    Period Weeks    Status On-going      OT LONG TERM GOAL #3   Title Pt will demonstrate improved fine motor coordination for ADLs  as evidenced by placing 9 pegs in 90 secs or less for 9 hole peg test.    Time 12    Period Weeks    Status On-going      OT LONG TERM GOAL #4   Title Pt will increase LUE grip strength by 5 lbs for increased LUE functional use.    Baseline RUE 65.9 LUE 33.7    Time 12    Period Weeks    Status On-going  OT LONG TERM GOAL #5   Title Pt will perform simple beverage prep with supervision.    Time 12    Period Weeks    Status On-going                   Plan - 05/13/21 1011     Clinical Impression Statement Pt improving in LUE function; pt able to consistently perform mid level reaching now, but fatigues and needs rest breaks. Pt also w/ improvement in Lt ring finger MP extension. Limited by vision    OT Occupational Profile and History Detailed Assessment- Review of Records and additional review of physical, cognitive, psychosocial history related to current functional performance    Occupational performance deficits (Please refer to evaluation for details): ADL's;IADL's;Rest and Sleep;Leisure;Social Participation    Body Structure / Function / Physical Skills ADL;UE functional use;Endurance;Balance;Flexibility;Pain;Vision;FMC;Gait;ROM;GMC;Coordination;Decreased knowledge of precautions;Decreased knowledge of use of DME;IADL;Strength;Dexterity;Mobility    Rehab Potential Good    Clinical Decision Making Several treatment options, min-mod task modification necessary   visual and  cognitive deficits, decrease coordiantion   Comorbidities Affecting Occupational Performance: May have comorbidities impacting occupational performance   Alzheimer's, macular degeneration, CVA   Modification or Assistance to Complete Evaluation  Min-Moderate modification of tasks or assist with assess necessary to complete eval   visual deficits, extra cueing and  assist required   OT Frequency 2x / week    OT Duration 12 weeks    OT Treatment/Interventions Self-care/ADL training;Ultrasound;Visual/perceptual remediation/compensation;Patient/family education;DME and/or AE instruction;Aquatic Therapy;Paraffin;Passive range of motion;Balance training;Gait Training;Stair Training;Fluidtherapy;Cryotherapy;Electrical Stimulation;Therapist, nutritional;Therapeutic activities;Manual Therapy;Therapeutic exercise;Splinting;Moist Heat;Neuromuscular education;Cognitive remediation/compensation    Plan assess remaining STG's    Consulted and Agree with Plan of Care Patient             Patient will benefit from skilled therapeutic intervention in order to improve the following deficits and impairments:   Body Structure / Function / Physical Skills: ADL, UE functional use, Endurance, Balance, Flexibility, Pain, Vision, FMC, Gait, ROM, GMC, Coordination, Decreased knowledge of precautions, Decreased knowledge of use of DME, IADL, Strength, Dexterity, Mobility       Visit Diagnosis: Hemiplegia and hemiparesis following cerebral infarction affecting left non-dominant side (HCC)  Other lack of coordination  Attention and concentration deficit  Unsteadiness on feet    Problem List Patient Active Problem List   Diagnosis Date Noted   Right pontine cerebrovascular accident ([place]) 03/12/2021   Acute ischemic stroke ([place]) 03/08/2021   Hyperkalemia 03/08/2021   Hypomagnesemia 03/08/2021   GERD (gastroesophageal reflux disease) 03/08/2021   Acute CVA (cerebrovascular accident) ([place]) 03/08/2021    COVID-19 01/01/2021   Sensorineural hearing loss (SNHL) of both ears 05/15/2017   Tinnitus, bilateral 05/15/2017   Cough 06/08/2015   Macular degeneration of both eyes 01/21/2014   Tobacco abuse, 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/08/2013   CAD (coronary artery disease)    Pericardial tamponade 09/17/2012   Acute pericarditis, unspecified 09/13/2012   Adenocarcinoma of left lung, stage 1 ([place]) 07/25/2011   Cancer of prostate w/med recur risk (T2b-c or Gleason 7 or PSA 10-20) ([place]) 07/25/2011   Nonmelanoma skin cancer 07/25/2011   History of colonic polyps 07/25/2011   Type 2 diabetes mellitus with vascular disease ([place]) 05/21/2007   HYPERCHOLESTEROLEMIA 05/21/2007   CARCINOMA, SKIN, SQUAMOUS CELL 01/08/2007   Generalized anxiety disorder 01/08/2007   ALZHEIMER'S DISEASE, MILD 01/08/2007   Essential hypertension 01/08/2007    Carey Bullocks, OTR/L 05/13/2021, 11:37 AM  [facility] [address]  [place], [place], [address] Phone: [phone]   Fax:  [phone]  Name: [name] MRN: [id number] Date of Birth: February 19, 1931

## 2021-05-13 NOTE — Therapy (Signed)
[facility] [address] [place], [place], [address] Phone: [phone]   Fax:  [phone]  Physical Therapy Treatment  Patient Details  Name: [name] MRN: [id number] Date of Birth: February 01, 1931 Referring Provider (PT): Cathlyn Parsons, PA-C   Encounter Date: 05/13/2021   PT End of Session - 05/13/21 1019     Visit Number 8    Number of Visits 16    Date for PT Re-Evaluation 06/09/21    Authorization Type [employer]    PT Start Time 1017    PT Stop Time 1058    PT Time Calculation (min) 41 min    Equipment Utilized During Treatment Gait belt    Activity Tolerance Patient tolerated treatment well    Behavior During Therapy [facility] for tasks assessed/performed             Past Medical History:  Diagnosis Date   Adenocarcinoma of left lung, stage 1 ([place]) 2001   T1N0 stage I  adenoca left lung resected 01/03/00    Alzheimer's disease ([place])    CAD (coronary artery disease) 2014   a. 09/2012 Cath: LM nl, LAD 50-60p, D1 60-70 m, D1 50ost, LCX nl, RCA min irregs, EF 55-65%.   Generalized anxiety disorder 01/08/2007   Qualifier: Diagnosis of  By: Diona Browner MD, [name]     History of colonic polyps 07/25/2011   Macular degeneration of both eyes 01/21/2014   Nonmelanoma skin cancer 07/25/2011   Multiple lesions excised face/nose   Pericarditis    a. 09/2012 with effusion and tamponade, s/p window.  b.  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 SCC IN SITU TX CX3   SCC (squamous cell carcinoma) 04/12/2005   RIGHT NECK SUP. SCC IN SITU TX EXC   SCC (squamous cell carcinoma) 04/16/2013   LEFT TEMPLE SCC IN SITU TX CX3 5FU   SCC (squamous cell carcinoma) 04/16/2013   BELOW LEFT EYE SCC IN SITU TX CX3 5FU   SCC (squamous cell carcinoma) 04/16/2013   BELOW RIGHT EYE SCC IN SITU TX CX3 5FU   SCC (squamous cell carcinoma) 08/04/2014   LEFT CHEEK SCC IN SITU TX CX3 5FU   SCC (squamous cell carcinoma) 11/27/2018   LEFT TEMPLE SCC IN SITU TX WITH BX   SCC (squamous cell carcinoma)    SCC (squamous cell carcinoma)    SCC (squamous cell carcinoma)    SCC (squamous cell carcinoma) Well Diff 09/13/2016   Tip of Nose SCC WELL DIFF TX (MOH's), and RIGHT INNER EYE SUP. SCC IN SITU TX TO WATCH   SCC (squamous cell carcinoma) Well Diff 05/30/2017   Right Cheekbone (Cx3,5FU) and Under Left Eye (Cx3,5FU)   Squamous cell carcinoma  in situ (SCCIS) 04/12/2005   Right Neck Inf (Cx3), Right Neck Sup (Exc), and Bulb of Nose (Cx3,Exc)   Squamous cell carcinoma in situ (SCCIS) 09/27/2005   Nose (Cx3,Exc)   Squamous cell carcinoma in situ (SCCIS) 02/18/2013   Below Left Eye (tx p bx)   Squamous cell carcinoma in situ (SCCIS) 04/16/2013   Left Temple (Cx3,5FU), Below Left Eye (Cx3,5FU), Below Right Eye (Cx3,5FU)   Squamous cell carcinoma in situ (SCCIS) 08/04/2014   Left Cheek (Cx3,5FU)   Squamous cell carcinoma in situ (SCCIS) 09/13/2016   Right Inner Eye Sup. 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   Type 2 diabetes  mellitus with vascular disease ([place]) 05/21/2007   Qualifier: Diagnosis of  By: Diona Browner MD, [name]     Type 2 DM with CKD stage 3 and hypertension ([place]) 07/16/2015   Unspecified essential hypertension     Past Surgical History:  Procedure Laterality Date   HERNIA REPAIR     LEFT HEART CATHETERIZATION WITH CORONARY ANGIOGRAM N/A 09/13/2012   Procedure: LEFT HEART CATHETERIZATION WITH CORONARY ANGIOGRAM;  Surgeon: Sherren Mocha, MD;  Location: [facility] CATH LAB;  Service: Cardiovascular;  Laterality: N/A;   LOBECTOMY  2001   upper left   PERICARDIAL TAP N/A 09/16/2012   Procedure: PERICARDIAL TAP;  Surgeon: Sinclair Grooms, MD;  Location: [facility] CATH LAB;  Service: Cardiovascular;  Laterality: N/A;   PROSTATECTOMY  2003   SUBXYPHOID PERICARDIAL WINDOW N/A 09/16/2012   Procedure: SUBXYPHOID PERICARDIAL WINDOW;  Surgeon: Rexene Alberts, MD;  Location: [place];  Service: Thoracic;  Laterality: N/A;   TONSILLECTOMY      There were no vitals filed for this visit.   Subjective Assessment - 05/13/21 1018     Subjective No new changes/complaints to report. Patient reporting some pain in the R arm. No falls.    Patient is accompained by: Family member    Pertinent History R pontine CVA 03/07/21. History of Alzheimer's dementia maintained on Aricept type 2 diabetes mellitus hypertension CKD stage III macular degeneration adenocarcinoma left lung with upper lobe resection 2001 prostate cancer with prostatectomy    Limitations Walking;Standing;House hold activities    How long can you sit comfortably? n/a    How long can you stand comfortably? As long as holding on to something    How long can you walk comfortably? Mostly in the house    Patient Stated Goals Pt: walk again without RW. Family: pt amb ind to bathroom (currently requires assist for safety)    Currently in Pain? Yes    Pain Score 3     Pain Location Shoulder    Pain Orientation Right    Pain Descriptors / Indicators Aching;Sore    Pain Type Acute  pain              [facility] Adult PT Treatment/Exercise - 05/13/21 0001       Transfers   Transfers Sit to Stand;Stand to Sit    Sit to Stand 5: Supervision    Five time sit to stand comments  13.97 secs with BUE support from mat at standard chair height    Stand to Sit 5:  Supervision      Ambulation/Gait   Ambulation/Gait Yes    Ambulation/Gait Assistance 5: Supervision;4: Min guard    Ambulation/Gait Assistance Details Completed ambulation with RW x 315 ft working on improved ambulation distance. Pt require rest break due to fatigue, mild SOB reported. HR: 97, 02: 97%.    Ambulation Distance (Feet) 315 Feet    Assistive device Rolling walker    Gait Pattern Step-through pattern;Decreased dorsiflexion - left;Decreased stance time - left;Lateral trunk lean to right;Poor foot clearance - left    Ambulation Surface Level;Indoor      Standardized Balance Assessment   Standardized Balance Assessment Berg Balance Test;Timed Up and Go Test      Berg Balance Test   Sit to Stand Able to stand  independently using hands    Standing Unsupported Able to stand 2 minutes with supervision    Sitting with Back Unsupported but Feet Supported on Floor or Stool Able to sit safely and securely 2 minutes    Stand to Sit Controls descent by using hands    Transfers Able to transfer safely, definite need of hands    Standing Unsupported with Eyes Closed Able to stand 10 seconds with supervision    Standing Ubsupported with Feet Together Able to place feet together independently and stand for 1 minute with supervision    From Standing, Reach Forward with Outstretched Arm Can reach forward >12 cm safely (5")   9"   From Standing Position, Pick up Object from Floor Able to pick up shoe, needs supervision    From Standing Position, Turn to Look Behind Over each Shoulder Looks behind from both sides and weight shifts well    Turn 360 Degrees Needs close supervision or verbal cueing    Standing Unsupported,  Alternately Place Feet on Step/Stool Able to complete 4 steps without aid or supervision    Standing Unsupported, One Foot in Front Able to take small step independently and hold 30 seconds    Standing on One Leg Tries to lift leg/unable to hold 3 seconds but remains standing independently    Total Score 38      Timed Up and Go Test   TUG Normal TUG    Normal TUG (seconds) 16.59   with use of RW     Exercises   Exercises Other Exercises    Other Exercises  Completed heel raises x 10 reps with BUE support. Cues to keep knee straight with completion.              PT Education - 05/13/21 1034     Education Details Progress toward STGs    Person(s) Educated Patient    Methods Explanation    Comprehension Verbalized understanding              PT Short Term Goals - 05/13/21 1020       PT SHORT TERM GOAL #1   Title Pt will perform exercises at home at least 3x/wk with supervision as needed    Baseline reports completed 2-3x/weekly and independence with no assitance required    Time 4    Period Weeks    Status Achieved    Target Date 05/12/21      PT SHORT TERM GOAL #2   Title Pt will have improved 5x STS to </=13 sec    Baseline 15.28 sec on eval; 13.97 secs with UE support    Time 4    Period Weeks    Status Partially Met  Target Date 05/12/21      PT SHORT TERM GOAL #3   Title Pt will be mod independent with ambulating to/from bathroom for safer home mobility    Baseline Mod I with household mobility    Time 4    Period Weeks    Status Achieved    Target Date 05/12/21      PT SHORT TERM GOAL #4   Title Pt will have improved Berg Balance Score to at least 42/56    Baseline 36/56; 38/56    Time 4    Period Weeks    Status Not Met    Target Date 05/12/21      PT SHORT TERM GOAL #5   Title Pt will demo improved TUG score to </=20 sec for decreased fall risk    Baseline 25.09 sec on eval; 16.59 secs with RW    Time 4    Period Weeks    Status Achieved     Target Date 05/12/21               PT Long Term Goals - 04/14/21 1515       PT LONG TERM GOAL #1   Title Pt will be independent with maintaining exercises at home    Time 8    Period Weeks    Status New    Target Date 06/09/21      PT LONG TERM GOAL #2   Title Pt will be able to amb with LRAD mod I x 800' for safe limited community amb    Time 8    Period Weeks    Status New    Target Date 06/09/21      PT LONG TERM GOAL #3   Title Pt will demo improved Berg Balance Score of at least 45/56 for decreased fall risk    Time 8    Period Weeks    Status New    Target Date 06/09/21      PT LONG TERM GOAL #4   Title Pt will have improved DGI to at least 19/24 to demo decreased fall risk    Baseline 13/24    Time 8    Period Weeks    Status New    Target Date 06/09/21      PT LONG TERM GOAL #5   Title Pt will have improved FOTO score of 65    Baseline 49    Time 8    Period Weeks    Status New    Target Date 06/09/21                   Plan - 05/13/21 1030     Clinical Impression Statement Today's skilled PT session included assesment of patient's progress toward STGs. Patient able to meet or partially meet STG #1,2,3 and 5. Patient made progress toward STG #4 (Berg Balance) but not able to meet goal level. Pt improved Berg Balance to 36/56, but still at high fall risk. Patient is making slow steady progress toward STGs, and will continue to benefit from skilled PT services.    Personal Factors and Comorbidities Age;Fitness;Time since onset of injury/illness/exacerbation    Examination-Activity Limitations Locomotion Level;Bathing;Squat;Stairs;Stand;Toileting;Transfers;Bed Mobility;Caring for Others    Examination-Participation Restrictions Meal Prep;Cleaning;Community Activity;Laundry;Shop    Stability/Clinical Decision Making Evolving/Moderate complexity    Rehab Potential Good    PT Frequency 2x / week    PT Duration 8 weeks    PT  Treatment/Interventions ADLs/Self Care Home Management;Aquatic  Therapy;Electrical Stimulation;Moist Heat;DME Instruction;Gait training;Stair training;Functional mobility training;Therapeutic activities;Therapeutic exercise;Balance training;Neuromuscular re-education;Patient/family education;Orthotic Fit/Training;Manual techniques;Passive range of motion;Dry needling;Taping    PT Next Visit Plan Continue to work on hip core strengtheing, standing balance, gait    PT Home Exercise Plan Access Code [email or web address]    Consulted and Agree with Plan of Care Patient;Family member/caregiver             Patient will benefit from skilled therapeutic intervention in order to improve the following deficits and impairments:  Abnormal gait, Decreased coordination, Impaired tone, Decreased endurance, Impaired UE functional use, Decreased activity tolerance, Decreased balance, Improper body mechanics, Decreased mobility, Decreased strength, Postural dysfunction  Visit Diagnosis: Unsteadiness on feet  Muscle weakness (generalized)  Difficulty in walking, not elsewhere classified  Other abnormalities of gait and mobility     Problem List Patient Active Problem List   Diagnosis Date Noted   Right pontine cerebrovascular accident ([place]) 03/12/2021   Acute ischemic stroke ([place]) 03/08/2021   Hyperkalemia 03/08/2021   Hypomagnesemia 03/08/2021   GERD (gastroesophageal reflux disease) 03/08/2021   Acute CVA (cerebrovascular accident) ([place]) 03/08/2021   COVID-19 01/01/2021   Sensorineural hearing loss (SNHL) of both ears 05/15/2017   Tinnitus, bilateral 05/15/2017   Cough 06/08/2015   Macular degeneration of both eyes 01/21/2014   Tobacco abuse, 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/08/2013   CAD (coronary artery disease)    Pericardial tamponade 09/17/2012   Acute pericarditis, unspecified 09/13/2012   Adenocarcinoma of left lung, stage 1 ([place]) 07/25/2011   Cancer of prostate w/med recur risk (T2b-c or Gleason 7 or PSA 10-20)  ([place]) 07/25/2011   Nonmelanoma skin cancer 07/25/2011   History of colonic polyps 07/25/2011   Type 2 diabetes mellitus with vascular disease ([place]) 05/21/2007   HYPERCHOLESTEROLEMIA 05/21/2007   CARCINOMA, SKIN, SQUAMOUS CELL 01/08/2007   Generalized anxiety disorder 01/08/2007   ALZHEIMER'S DISEASE, MILD 01/08/2007   Essential hypertension 01/08/2007    Jones Bales, PT, DPT 05/13/2021, 10:59 AM  [facility] [address] [place], [place], [address] Phone: [phone]   Fax:  [phone]  Name: [name] MRN: [id number] Date of Birth: 21-Mar-1931

## 2021-05-18 ENCOUNTER — Encounter: Payer: Medicare HMO | Attending: Physical Medicine & Rehabilitation | Admitting: Physical Medicine & Rehabilitation

## 2021-05-18 ENCOUNTER — Other Ambulatory Visit: Payer: Self-pay

## 2021-05-18 ENCOUNTER — Encounter: Payer: Self-pay | Admitting: Physical Medicine & Rehabilitation

## 2021-05-18 VITALS — BP 151/80 | HR 75 | Temp 98.1°F | Ht 69.0 in | Wt 176.4 lb

## 2021-05-18 DIAGNOSIS — I6350 Cerebral infarction due to unspecified occlusion or stenosis of unspecified cerebral artery: Secondary | ICD-10-CM | POA: Insufficient documentation

## 2021-05-18 NOTE — Progress Notes (Signed)
Subjective:    Patient ID: [name], male    DOB: 06-18-1930, 88 y.o.   MRN: [id number] 88 y.o. 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  Per chart review lives with spouse as well as a live-in caregiver.  Presented 03/07/2021 left-sided weakness x2 to 3 days.  Cranial CT scan negative.  MRI identified small foci of acute ischemia within the ventral right pons and left frontal periventricular white matter.  No hemorrhage or mass-effect.  MRA with no intracranial large vessel occlusion.  Patient did not receive tPA.  Admission chemistries unremarkable except potassium 5.2 glucose 229 creatinine 1.50 hemoglobin A1c 8.3 urinalysis negative nitrite.  Echocardiogram with ejection fraction of 50 to 55% no wall motion abnormalities grade 1 diastolic dysfunction.  Neurology follow-up maintained on aspirin 81 mg daily and Plavix 75 mg daily for CVA prophylaxis x3 weeks and aspirin alone.  Lovenox for DVT prophylaxis.  Dysphagia #3 nectar thick liquid diet.  Therapy evaluations completed due to patient's left-sided weakness was admitted for a comprehensive rehab program. Admit date: 03/12/2021 Discharge date: 04/02/2021   HPI  No falls since at home Needs supervision for shower chair transfers Seen by PCP 04/15/21  Function after inpt rehab 04/02/21 Sit to stand rolling walker contact-guard ambulated 150 feet rolling walker contact-guard wearing left lower extremity AFO. Stair negotiation ascending descending with some cues contact-guard   Function after last PT/OT visit 05/13/21 UBE x 5 min. Level 1 for normal reciprocal movement pattern.    Practiced donning Lt shoe (w/ AFO) with mod assist. Pt still having trouble with this. Encouraged pt to use LUE to assist in low level activities  as much as able (pulling up socks, pants, folding towels/clothes, bathing, etc)  Completed ambulation with RW x 315 ft working on improved ambulation distance. Pt require rest break due to fatigue, mild SOB reported. HR: 97, 02: 97%.  Pain Inventory Average Pain 0 Pain Right Now 0 My pain is  no pain  BOWEL Number of stools per week: 3  BLADDER Pads Bladder incontinence Yes  Frequent urination Yes    Mobility walk with assistance use a walker ability to climb steps?  no do you drive?  no  Function retired I need assistance with the following:  dressing, bathing, toileting, meal prep, household duties, and shopping  Neuro/Psych trouble walking dizziness anxiety  Prior Studies Any changes since last visit?  no  Physicians involved in your care Any changes since last visit?  no   Family History  Problem Relation Age of Onset   Cancer Father        colon   Dementia Mother    Diabetes Mother    Cancer Brother        lung   Social History   Socioeconomic History   Marital status: Married    Spouse name: Not on file   Number of children: Not on file   Years of education: Not on file   Highest education level: Not on file  Occupational History   Occupation: retired  Tobacco Use   Smoking status: Former    Packs/day: 2.00    Years: 42.00    Pack years: 84.00    Types: Cigarettes    Start date: 02/25/1953    Quit date: 04/04/1984    Years since quitting: 4.1  Smokeless tobacco: Former    Types: Chew    Quit date: 07/27/1986   Tobacco comments:    quit tobacco 26 years ago  Vaping Use   Vaping Use: Never used  Substance and Sexual Activity   Alcohol use: No    Alcohol/week: 0.0 standard drinks   Drug use: No   Sexual activity: Not Currently  Other Topics Concern   Not on file  Social History Narrative   Lives in [place], [place]   Regular exercise--no, mowing grass   Diet: fruit and veggies   Social Determinants of Health   Financial  Resource Strain: Low Risk    Difficulty of Paying Living Expenses: Not very hard  Food Insecurity: Not on file  Transportation Needs: Not on file  Physical Activity: Not on file  Stress: Not on file  Social Connections: Not on file   Past Surgical History:  Procedure Laterality Date   [place] N/A 09/13/2012   Procedure: [place];  Surgeon: Sherren Mocha, MD;  Location: [facility] CATH LAB;  Service: Cardiovascular;  Laterality: N/A;   LOBECTOMY  2001   upper left   PERICARDIAL TAP N/A 09/16/2012   Procedure: PERICARDIAL TAP;  Surgeon: Sinclair Grooms, MD;  Location: [facility] CATH LAB;  Service: Cardiovascular;  Laterality: N/A;   PROSTATECTOMY  2003   SUBXYPHOID PERICARDIAL WINDOW N/A 09/16/2012   Procedure: SUBXYPHOID PERICARDIAL WINDOW;  Surgeon: Rexene Alberts, MD;  Location: [place];  Service: Thoracic;  Laterality: N/A;   TONSILLECTOMY     Past Medical History:  Diagnosis Date   Adenocarcinoma of left lung, stage 1 ([place]) 2001   T1N0 stage I  adenoca left lung resected 01/03/00    Alzheimer's disease ([place])    CAD (coronary artery disease) 2014   a. 09/2012 Cath: LM nl, LAD 50-60p, D1 60-70 m, D1 50ost, LCX nl, RCA min irregs, EF 55-65%.   Generalized anxiety disorder 01/08/2007   Qualifier: Diagnosis of  By: Diona Browner MD, [name]     History of colonic polyps 07/25/2011   Macular degeneration of both eyes 01/21/2014   Nonmelanoma skin cancer 07/25/2011   Multiple lesions excised face/nose   Pericarditis    a. 09/2012 with effusion and tamponade, s/p window.  b.  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 SCC IN SITU TX CX3   SCC (squamous cell carcinoma) 04/12/2005   RIGHT NECK SUP. SCC IN SITU TX EXC   SCC (squamous cell carcinoma) 04/16/2013   LEFT TEMPLE SCC IN SITU TX CX3 5FU   SCC (squamous cell carcinoma) 04/16/2013   BELOW LEFT EYE SCC IN SITU TX CX3 5FU  SCC (squamous cell carcinoma) 04/16/2013   BELOW RIGHT EYE SCC IN SITU TX CX3 5FU   SCC (squamous cell carcinoma) 08/04/2014   LEFT CHEEK SCC IN SITU TX CX3 5FU   SCC (squamous cell carcinoma) 11/27/2018   LEFT TEMPLE SCC IN SITU TX WITH BX   SCC (squamous cell carcinoma)    SCC (squamous cell carcinoma)    SCC (squamous cell carcinoma)    SCC (squamous cell carcinoma) Well Diff 09/13/2016   Tip of Nose SCC WELL DIFF TX (MOH's), and RIGHT INNER EYE SUP. SCC IN SITU TX TO WATCH   SCC (squamous cell carcinoma) Well Diff 05/30/2017   Right Cheekbone (Cx3,5FU) and Under Left Eye (Cx3,5FU)   Squamous cell carcinoma in situ (SCCIS) 04/12/2005   Right Neck Inf (Cx3), Right Neck Sup (Exc), and Bulb of Nose (Cx3,Exc)   Squamous cell carcinoma in situ (SCCIS) 09/27/2005   Nose (Cx3,Exc)   Squamous cell carcinoma in situ (SCCIS) 02/18/2013   Below Left Eye (tx p bx)   Squamous cell carcinoma in situ (SCCIS) 04/16/2013   Left Temple (Cx3,5FU), Below Left Eye (Cx3,5FU), Below Right Eye (Cx3,5FU)   Squamous cell carcinoma in situ (SCCIS) 08/04/2014   Left Cheek (Cx3,5FU)   Squamous cell carcinoma in situ (SCCIS) 09/13/2016   Right Inner Eye Sup. 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 10/25/2006   Right Elbow (Cx3,5FU)   Squamous cell carcinoma of skin 09/27/2005   NOSE SCC IN SITU TC CX3 5FU   Tobacco abuse, 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/08/2013   Type 2 diabetes mellitus with vascular disease ([place]) 05/21/2007   Qualifier: Diagnosis of  By: Diona Browner MD, [name]     Type 2 DM with CKD stage 3 and hypertension ([place]) 07/16/2015   Unspecified essential hypertension    BP (!) 151/80    Pulse 75    Temp 98.1 F (36.7 C)    Ht 5\' 9"  (1.753 m)    Wt 176 lb 6.4 oz (80 kg)    SpO2 93%    BMI 26.05 kg/m   Opioid Risk Score:   Fall Risk Score:  `1  Depression screen PHQ 2/9  Depression screen [facility] 2/9 05/18/2021 04/13/2021 12/26/2019 09/03/2018 08/30/2017 08/15/2016 01/15/2015  Decreased Interest 0 0 0 0 0 0 0  Down, Depressed, Hopeless 0 1 1 0 0 0 0  PHQ - 2 Score 0 1 1 0 0 0 0  Altered sleeping - 0 - 0 0 - -  Tired, decreased energy - 1 - 0 0 - -  Change in appetite - 0 - 0 0 - -  Feeling bad or failure about yourself  - 0 - 0 0 - -  Trouble concentrating - 0 - 0 0 - -  Moving slowly or fidgety/restless - 0 - 0 0 - -  Suicidal thoughts - 0 - 0 0 - -  PHQ-9 Score - 2 - 0 0 - -  Difficult doing work/chores - - - Not difficult at all Not difficult at all - -  Some recent data might be hidden     Review of Systems  Constitutional: Negative.   HENT: Negative.    Eyes: Negative.   Respiratory: Negative.    Cardiovascular: Negative.   Gastrointestinal: Negative.   Endocrine: Negative.   Genitourinary: Negative.   Musculoskeletal:  Positive for gait  problem.  Skin: Negative.   Allergic/Immunologic: Negative.   Neurological:  Positive for dizziness.  Hematological: Negative.   Psychiatric/Behavioral:  The patient is nervous/anxious.   All other systems reviewed and are negative.     Objective:   Physical Exam Vitals and nursing note reviewed.  Constitutional:      Appearance: He is normal weight.  HENT:     Head: Normocephalic and atraumatic.  Eyes:     Extraocular  Movements: Extraocular movements intact.     Conjunctiva/sclera: Conjunctivae normal.     Pupils: Pupils are equal, round, and reactive to light.  Neurological:     Mental Status: He is alert and oriented to person, place, and time.     Cranial Nerves: Dysarthria present.     Coordination: Coordination abnormal. Finger-Nose-Finger Test abnormal. Impaired rapid alternating movements.     Gait: Gait abnormal.     Comments: 4/5 strength in the left deltoid, bicep, tricep, grip, hip flexor, knee extensor, ankle 5/5 strength in the right deltoid, bicep, tricep, grip, hip flexor, knee extensor, and dorsiflex and plantarflex  Sensation intact to light touch bilateral upper and lower limbs Reduced rapid alternating supination pronation left upper extremity. Wears a left AFO ambulates with a rolling walker no evidence of toe drag there is a wide base support  Psychiatric:        Mood and Affect: Mood normal.        Behavior: Behavior normal.  3 - left ankle dorsiflexor        Assessment & Plan:  #1.  Right pontine infarct with left hemiparesis, patient has made a good functional improvement still has some residual deficits, continues ambulate with walker as well as an AFO.  He will finish out outpatient therapy in approximately 1 month.  I have encouraged him to continue his home exercise program after he finishes his outpatient therapy.  I would like to see him back in approximately 3 months Driving not an issue since he has already given up driving due to macular degeneration

## 2021-05-19 ENCOUNTER — Ambulatory Visit: Payer: Medicare HMO | Admitting: Occupational Therapy

## 2021-05-19 ENCOUNTER — Emergency Department (HOSPITAL_COMMUNITY): Payer: Medicare HMO

## 2021-05-19 ENCOUNTER — Telehealth: Payer: Self-pay | Admitting: Physical Medicine & Rehabilitation

## 2021-05-19 ENCOUNTER — Ambulatory Visit: Payer: Medicare HMO | Admitting: Physical Therapy

## 2021-05-19 ENCOUNTER — Emergency Department (HOSPITAL_COMMUNITY)
Admission: EM | Admit: 2021-05-19 | Discharge: 2021-05-19 | Disposition: A | Discharge status: Home / Self Care | Payer: Medicare HMO | Attending: Emergency Medicine | Admitting: Emergency Medicine

## 2021-05-19 DIAGNOSIS — R4781 Slurred speech: Secondary | ICD-10-CM | POA: Diagnosis not present

## 2021-05-19 DIAGNOSIS — E119 Type 2 diabetes mellitus without complications: Secondary | ICD-10-CM | POA: Diagnosis not present

## 2021-05-19 DIAGNOSIS — R531 Weakness: Secondary | ICD-10-CM | POA: Diagnosis not present

## 2021-05-19 DIAGNOSIS — R7309 Other abnormal glucose: Secondary | ICD-10-CM | POA: Diagnosis not present

## 2021-05-19 DIAGNOSIS — Z20822 Contact with and (suspected) exposure to covid-19: Secondary | ICD-10-CM | POA: Insufficient documentation

## 2021-05-19 DIAGNOSIS — R9431 Abnormal electrocardiogram [ECG] [EKG]: Secondary | ICD-10-CM | POA: Diagnosis not present

## 2021-05-19 DIAGNOSIS — G319 Degenerative disease of nervous system, unspecified: Secondary | ICD-10-CM | POA: Diagnosis not present

## 2021-05-19 DIAGNOSIS — Z7982 Long term (current) use of aspirin: Secondary | ICD-10-CM | POA: Insufficient documentation

## 2021-05-19 DIAGNOSIS — I69354 Hemiplegia and hemiparesis following cerebral infarction affecting left non-dominant side: Secondary | ICD-10-CM | POA: Insufficient documentation

## 2021-05-19 DIAGNOSIS — Z743 Need for continuous supervision: Secondary | ICD-10-CM | POA: Diagnosis not present

## 2021-05-19 DIAGNOSIS — I639 Cerebral infarction, unspecified: Secondary | ICD-10-CM

## 2021-05-19 DIAGNOSIS — R29898 Other symptoms and signs involving the musculoskeletal system: Secondary | ICD-10-CM | POA: Diagnosis not present

## 2021-05-19 LAB — COMPREHENSIVE METABOLIC PANEL
ALT: 9 U/L (ref 0–44)
AST: 12 U/L — ABNORMAL LOW (ref 15–41)
Albumin: 3.6 g/dL (ref 3.5–5.0)
Alkaline Phosphatase: 74 U/L (ref 38–126)
Anion gap: 6 (ref 5–15)
BUN: 20 mg/dL (ref 8–23)
CO2: 26 mmol/L (ref 22–32)
Calcium: 8.7 mg/dL — ABNORMAL LOW (ref 8.9–10.3)
Chloride: 106 mmol/L (ref 98–111)
Creatinine, Ser: 1.46 mg/dL — ABNORMAL HIGH (ref 0.61–1.24)
GFR, Estimated: 46 mL/min — ABNORMAL LOW (ref >60)
Glucose, Bld: 139 mg/dL — ABNORMAL HIGH (ref 70–99)
Potassium: 4.7 mmol/L (ref 3.5–5.1)
Sodium: 138 mmol/L (ref 135–145)
Total Bilirubin: 0.4 mg/dL (ref 0.3–1.2)
Total Protein: 6.1 g/dL — ABNORMAL LOW (ref 6.5–8.1)

## 2021-05-19 LAB — DIFFERENTIAL
Abs Immature Granulocytes: 0.15 10*3/uL — ABNORMAL HIGH (ref 0.00–0.07)
Basophils Absolute: 0 10*3/uL (ref 0.0–0.1)
Basophils Relative: 0 %
Eosinophils Absolute: 0.1 10*3/uL (ref 0.0–0.5)
Eosinophils Relative: 1 %
Immature Granulocytes: 3 %
Lymphocytes Relative: 11 %
Lymphs Abs: 0.6 10*3/uL — ABNORMAL LOW (ref 0.7–4.0)
Monocytes Absolute: 0.9 10*3/uL (ref 0.1–1.0)
Monocytes Relative: 17 %
Neutro Abs: 3.9 10*3/uL (ref 1.7–7.7)
Neutrophils Relative %: 68 %

## 2021-05-19 LAB — URINALYSIS, ROUTINE W REFLEX MICROSCOPIC
Bilirubin Urine: NEGATIVE
Glucose, UA: NEGATIVE mg/dL
Hgb urine dipstick: NEGATIVE
Ketones, ur: NEGATIVE mg/dL
Leukocytes,Ua: NEGATIVE
Nitrite: NEGATIVE
Protein, ur: NEGATIVE mg/dL
Specific Gravity, Urine: 1.017 (ref 1.005–1.030)
pH: 5 (ref 5.0–8.0)

## 2021-05-19 LAB — RESP PANEL BY RT-PCR (FLU A&B, COVID) ARPGX2
Influenza A by PCR: NEGATIVE
Influenza B by PCR: NEGATIVE
SARS Coronavirus 2 by RT PCR: NEGATIVE

## 2021-05-19 LAB — CBC
HCT: 39.1 % (ref 39.0–52.0)
Hemoglobin: 12.4 g/dL — ABNORMAL LOW (ref 13.0–17.0)
MCH: 32.7 pg (ref 26.0–34.0)
MCHC: 31.7 g/dL (ref 30.0–36.0)
MCV: 103.2 fL — ABNORMAL HIGH (ref 80.0–100.0)
Platelets: 102 10*3/uL — ABNORMAL LOW (ref 150–400)
RBC: 3.79 MIL/uL — ABNORMAL LOW (ref 4.22–5.81)
RDW: 12.5 % (ref 11.5–15.5)
WBC: 5.6 10*3/uL (ref 4.0–10.5)
nRBC: 0 % (ref 0.0–0.2)

## 2021-05-19 LAB — I-STAT CHEM 8, ED
BUN: 22 mg/dL (ref 8–23)
Calcium, Ion: 1.15 mmol/L (ref 1.15–1.40)
Chloride: 104 mmol/L (ref 98–111)
Creatinine, Ser: 1.5 mg/dL — ABNORMAL HIGH (ref 0.61–1.24)
Glucose, Bld: 135 mg/dL — ABNORMAL HIGH (ref 70–99)
HCT: 38 % — ABNORMAL LOW (ref 39.0–52.0)
Hemoglobin: 12.9 g/dL — ABNORMAL LOW (ref 13.0–17.0)
Potassium: 4.8 mmol/L (ref 3.5–5.1)
Sodium: 141 mmol/L (ref 135–145)
TCO2: 28 mmol/L (ref 22–32)

## 2021-05-19 LAB — CBG MONITORING, ED: Glucose-Capillary: 140 mg/dL — ABNORMAL HIGH (ref 70–99)

## 2021-05-19 LAB — PROTIME-INR
INR: 1.2 (ref 0.8–1.2)
Prothrombin Time: 14.7 seconds (ref 11.4–15.2)

## 2021-05-19 LAB — APTT: aPTT: 37 seconds — ABNORMAL HIGH (ref 24–36)

## 2021-05-19 MED ORDER — SODIUM CHLORIDE 0.9% FLUSH: 3.0000 mL | Freq: Once | INTRAVENOUS | Status: DC

## 2021-05-19 NOTE — Consult Note (Addendum)
Neurology Consultation  Reason for Consult: Code Stroke Referring Physician: Dr. Melina Copa  CC: Left-sided weakness, slurred speech   History is obtained from: Patient, Chart review, EMS  HPI: [name] is a 88 y.o. 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 Patient's daughter spoke with [name] on the telephone this morning and noted that his speech was at his baseline from his recent stroke. Mr. Gentle states that he was able to get out of bed this morning and felt that his strength was also at baseline. The patient's son came to pick him up and take him to physical therapy when the patient seemed weaker on the left side while trying to get off of the couch, his speech was slurred more significantly than baseline, and he was dragging his foot when attempting to go to PT prompting his son to activate EMS for further evaluation. Patient does have residual left-sided hemiparesis from his stroke but had made some improvements with outpatient PT. He is usually able to ambulate at home with the use of a walker.   LKW: 10:00 this morning (2/15) TNK given?: no, patient presented outside of the thombolytic therapy window, recent CVA, and presentation concerning for recrudescence of old stroke symptoms.  IR Thrombectomy? No, presentation is not consistent with an LVO.  Modified Rankin Scale: 3-Moderate disability-requires help but walks WITHOUT assistance  ROS: A complete ROS was performed and is negative except as noted in the HPI.   Past Medical History:  Diagnosis Date   Adenocarcinoma of left lung, stage 1 ([place]) 2001   T1N0 stage I  adenoca left lung  resected 01/03/00    Alzheimer's disease ([place])    CAD (coronary artery disease) 2014   a. 09/2012 Cath: LM nl, LAD 50-60p, D1 60-70 m, D1 50ost, LCX nl, RCA min irregs, EF 55-65%.   Generalized anxiety disorder 01/08/2007   Qualifier: Diagnosis of  By: Diona Browner MD, [name]     History of colonic polyps 07/25/2011   Macular degeneration of both eyes 01/21/2014   Nonmelanoma skin cancer 07/25/2011   Multiple lesions excised face/nose   Pericarditis    a. 09/2012 with effusion and tamponade, s/p window.  b.  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 SCC IN SITU TX CX3   SCC (squamous cell carcinoma) 04/12/2005   RIGHT NECK SUP. SCC IN SITU TX EXC   SCC (squamous cell carcinoma) 04/16/2013   LEFT TEMPLE SCC IN SITU  TX CX3 5FU   SCC (squamous cell carcinoma) 04/16/2013   BELOW LEFT EYE SCC IN SITU TX CX3 5FU   SCC (squamous cell carcinoma) 04/16/2013   BELOW RIGHT EYE SCC IN SITU TX CX3 5FU   SCC (squamous cell carcinoma) 08/04/2014   LEFT CHEEK SCC IN SITU TX CX3 5FU   SCC (squamous cell carcinoma) 11/27/2018   LEFT TEMPLE SCC IN SITU TX WITH BX   SCC (squamous cell carcinoma)    SCC (squamous cell carcinoma)    SCC (squamous cell carcinoma)    SCC (squamous cell carcinoma) Well Diff 09/13/2016   Tip of Nose SCC WELL DIFF TX (MOH's), and RIGHT INNER EYE SUP. SCC IN SITU TX TO WATCH    SCC (squamous cell carcinoma) Well Diff 05/30/2017   Right Cheekbone (Cx3,5FU) and Under Left Eye (Cx3,5FU)   Squamous cell carcinoma in situ (SCCIS) 04/12/2005   Right Neck Inf (Cx3), Right Neck Sup (Exc), and Bulb of Nose (Cx3,Exc)   Squamous cell carcinoma in situ (SCCIS) 09/27/2005   Nose (Cx3,Exc)   Squamous cell carcinoma in situ (SCCIS) 02/18/2013   Below Left Eye (tx p bx)   Squamous cell carcinoma in situ (SCCIS) 04/16/2013   Left Temple (Cx3,5FU), Below Left Eye (Cx3,5FU), Below Right Eye (Cx3,5FU)   Squamous cell carcinoma in situ (SCCIS) 08/04/2014   Left Cheek (Cx3,5FU)   Squamous cell carcinoma in situ (SCCIS) 09/13/2016   Right Inner Eye Sup. 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   Type 2 diabetes mellitus with vascular disease ([place]) 05/21/2007   Qualifier: Diagnosis of  By: Diona Browner MD, [name]     Type 2 DM with CKD stage 3 and hypertension ([place]) 07/16/2015   Unspecified essential hypertension    Past Surgical History:  Procedure Laterality Date   HERNIA REPAIR     LEFT HEART CATHETERIZATION WITH CORONARY ANGIOGRAM N/A 09/13/2012   Procedure: LEFT HEART CATHETERIZATION WITH CORONARY ANGIOGRAM;  Surgeon: Sherren Mocha, MD;  Location[facility] CATH LAB;  Service: Cardiovascular;  Laterality: N/A;   LOBECTOMY  2001   upper left   PERICARDIAL TAP N/A 09/16/2012   Procedure: PERICARDIAL TAP;  Surgeon: Sinclair Grooms, MD;  Location: [facility] CATH LAB;  Service: Cardiovascular;  Laterality: N/A;   PROSTATECTOMY  2003   SUBXYPHOID PERICARDIAL WINDOW N/A 09/16/2012   Procedure: SUBXYPHOID PERICARDIAL WINDOW;  Surgeon: Rexene Alberts, MD;  Location: [facility] OR;  Service: Thoracic;  Laterality: N/A;    TONSILLECTOMY     Family History  Problem Relation Age of Onset   Cancer Father        colon   Dementia Mother    Diabetes Mother    Cancer Brother        lung   Social History:   reports that he quit smoking about 37 years ago. His smoking use included cigarettes. He started smoking about 68 years ago. He has a 84.00 pack-year smoking history. He quit smokeless tobacco use about 34 years ago.  His smokeless tobacco use included chew. He reports that he does not drink alcohol and does not use drugs.  Medications  Current Facility-Administered Medications:    sodium chloride  flush (NS) 0.9 % injection 3 mL, 3 mL, Intravenous, Once, Hayden Rasmussen, MD  Current Outpatient Medications:    acetaminophen (TYLENOL) 325 MG tablet, Take 2 tablets (650 mg total) by mouth every 6 (six) hours as needed for mild pain (or Fever >/= 101)., Disp: , Rfl:    aspirin EC 81 MG tablet, Take 81 mg by mouth daily as needed for moderate pain., Disp: , Rfl:    atorvastatin (LIPITOR) 20 MG tablet, Take 1 tablet (20 mg total) by mouth daily., Disp: 90 tablet, Rfl: 3   Continuous Blood Gluc Receiver (FREESTYLE LIBRE 2 READER) DEVI, Use to check blood sugar continous, Disp: 1 each, Rfl: 0   Continuous Blood Gluc Sensor (FREESTYLE LIBRE 2 SENSOR) MISC, Use to check blood sugar continuous, Disp: 2 each, Rfl: 11   donepezil (ARICEPT) 10 MG tablet, TAKE 1 TABLET BY MOUTH EVERYDAY AT BEDTIME, Disp: 90 tablet, Rfl: 3   ferrous sulfate 325 (65 FE) MG tablet, Take 1 tablet (325 mg total) by mouth daily with breakfast., Disp: 90 tablet, Rfl: 3   glipiZIDE (GLUCOTROL) 5 MG tablet, Take 1 tablet (5 mg total) by mouth 2 (two) times daily before a meal., Disp: 60 tablet, Rfl: 3   Multiple Vitamins-Minerals (ICAPS LUTEIN & ZEAXANTHIN PO), Take 1 capsule by mouth in the morning and at bedtime. I - Caps, Disp: , Rfl:    Multiple Vitamins-Minerals (ICAPS) CAPS, Take by mouth., Disp: , Rfl:    Omega-3 Fatty Acids (FISH OIL) 1000  MG CAPS, Take 1 capsule (1,000 mg total) by mouth in the morning and at bedtime., Disp: 30 capsule, Rfl: 0   pantoprazole (PROTONIX) 40 MG tablet, Take 1 tablet (40 mg total) by mouth daily., Disp: 90 tablet, Rfl: 3   polyethylene glycol (MIRALAX / GLYCOLAX) 17 g packet, Take 17 g by mouth daily., Disp: 14 each, Rfl: 0   Propylene Glycol (SYSTANE BALANCE) 0.6 % SOLN, Apply 1 drop to eye 2 (two) times daily as needed (dry eyes)., Disp: , Rfl:    senna-docusate (SENOKOT-S) 8.6-50 MG tablet, Take 2 tablets by mouth 2 (two) times daily., Disp: , Rfl:    sertraline (ZOLOFT) 100 MG tablet, Take 1 tablet (100 mg total) by mouth daily., Disp: 90 tablet, Rfl: 3   tamsulosin (FLOMAX) 0.4 MG CAPS capsule, Take 1 capsule (0.4 mg total) by mouth daily after supper., Disp: 90 capsule, Rfl: 3   vitamin B-12 (CYANOCOBALAMIN) 1000 MCG tablet, Take 1 tablet (1,000 mcg total) by mouth daily., Disp: 90 tablet, Rfl: 3  Exam: Current vital signs: BP (!) 146/67 (BP Location: Left Arm)    Pulse 76    Temp 98.3 F (36.8 C) (Oral)    Resp (!) 22    SpO2 100%  Vital signs in last 24 hours:   GENERAL: Awake, alert, in no acute distress Psych: Affect appropriate for situation, patient is calm and cooperative with examination Head: Normocephalic and atraumatic, without obvious abnormality EENT: Normal conjunctivae, dry mucous membranes, no OP obstruction LUNGS: Normal respiratory effort. Non-labored breathing on room air CV: Regular rate and rhythm on telemetry ABDOMEN: Soft, non-tender, non-distended Extremities: Warm, well perfused, without obvious deformity  NEURO:  Mental Status: Awake, alert, and oriented to person, place, time, and situation. He is able to provide a clear and coherent history of present illness. Speech/Language: speech is mildly dysarthric on exam.   Naming, repetition, fluency, and comprehension intact without aphasia. No neglect is noted Cranial Nerves:  II: PERRL. Visual fields  full.   III, IV, VI: EOMI without ptosis, gaze preference, or nystagmus.  V: Sensation is intact to light touch and symmetrical to face.  VII: Face is asymmetric with mild mouth droop VIII: Hearing is intact to loud voice (hearing aids in place improve hearing) IX, X: Palate elevation is symmetric. Phonation normal.  XI: Normal sternocleidomastoid and trapezius muscle strength XII: Tongue protrudes midline without fasciculations.   Motor: Patient is able to elevate bilateral upper extremities and right lower extremity antigravity without vertical drift on assessment.  Patient's left lower extremity elevates antigravity with minimal vertical drift on assessment.  Tone is normal. Bulk is normal.  Sensation: Intact to light touch bilaterally in all four extremities. No extinction to DSS present.  Coordination: No obvious dysmetria on exam  Gait: Deferred  NIHSS: 1a Level of Conscious.: 0 1b LOC Questions: 0 1c LOC Commands: 0 2 Best Gaze: 0 3 Visual: 0 4 Facial Palsy: 1 5a Motor Arm - left: 0 5b Motor Arm - Right: 0 6a Motor Leg - Left: 1 6b Motor Leg - Right: 0 7 Limb Ataxia: 0 8 Sensory: 0 9 Best Language: 0 10 Dysarthria: 1 11 Extinct. and Inatten.: 0 TOTAL: 3  Labs I have reviewed labs in epic and the results pertinent to this consultation are: CBC    Component Value Date/Time   WBC 5.6 05/19/2021 1444   RBC 3.79 (L) 05/19/2021 1444   HGB 12.9 (L) 05/19/2021 1451   HGB 13.7 07/30/2015 0954   HGB 12.9 (L) 07/16/2013 1059   HCT 38.0 (L) 05/19/2021 1451   HCT 41.2 07/30/2015 0954   HCT 39.6 07/16/2013 1059   PLT PENDING 05/19/2021 1444   PLT 137 (L) 07/30/2015 0954   PLT 142 07/16/2013 1059   MCV 103.2 (H) 05/19/2021 1444   MCV 99 (H) 07/30/2015 0954   MCV 95.4 07/16/2013 1059   MCH 32.7 05/19/2021 1444   MCHC 31.7 05/19/2021 1444   RDW 12.5 05/19/2021 1444   RDW 12.7 07/30/2015 0954   RDW 12.6 07/16/2013 1059   LYMPHSABS 0.5 (L) 04/15/2021 1229   LYMPHSABS 1.1  07/30/2015 0954   LYMPHSABS 0.8 (L) 07/16/2013 1059   MONOABS 1.0 04/15/2021 1229   MONOABS 0.7 07/16/2013 1059   EOSABS 0.0 04/15/2021 1229   EOSABS 0.1 07/30/2015 0954   BASOSABS 0.0 04/15/2021 1229   BASOSABS 0.0 07/30/2015 0954   BASOSABS 0.0 07/16/2013 1059   CMP     Component Value Date/Time   NA 141 05/19/2021 1451   NA 143 07/30/2015 0957   K 4.8 05/19/2021 1451   K 5.3 No visable hemolysis (H) 07/30/2015 0957   CL 104 05/19/2021 1451   CL 103 07/20/2012 1046   CO2 30 04/15/2021 1229   CO2 28 07/30/2015 0957   GLUCOSE 135 (H) 05/19/2021 1451   GLUCOSE 231 (H) 07/30/2015 0957   GLUCOSE 215 (H) 07/20/2012 1046   BUN 22 05/19/2021 1451   BUN 14.4 07/30/2015 0957   CREATININE 1.50 (H) 05/19/2021 1451   CREATININE 1.5 (H) 07/30/2015 0957   CALCIUM 9.4 04/15/2021 1229   CALCIUM 9.5 07/30/2015 0957   PROT 6.7 04/15/2021 1229   PROT 6.8 07/30/2015 0957   ALBUMIN 4.0 04/15/2021 1229   ALBUMIN 3.9 07/30/2015 0957   AST 10 04/15/2021 1229   AST 10 07/30/2015 0957   ALT 9 04/15/2021 1229   ALT 11 07/30/2015 0957   ALKPHOS 85 04/15/2021 1229   ALKPHOS 88 07/30/2015 0957   BILITOT 0.4 04/15/2021  1229   BILITOT 0.43 07/30/2015 0957   GFRNONAA 44 (L) 03/30/2021 0551   GFRAA 58 (L) 06/25/2018 2149   Lipid Panel     Component Value Date/Time   CHOL 122 03/08/2021 0424   TRIG 109 03/08/2021 0424   HDL 17 (L) 03/08/2021 0424   CHOLHDL 7.2 03/08/2021 0424   VLDL 22 03/08/2021 0424   LDLCALC 83 03/08/2021 0424   LDLDIRECT 130.0 12/20/2019 0920   Lab Results  Component Value Date   HGBA1C 8.3 (H) 03/08/2021   Imaging I have reviewed the images obtained:  CT-scan of the brain 2/15: - No evidence of acute intracranial abnormality. - Mild chronic small vessel ischemic changes within the cerebral white matter. - Known chronic infarct within the ventral pons. - Unchanged from the brain MRI of 03/08/2021, there is advanced cerebral atrophy with symmetric prominence of  the extra-axial CSF spaces (most notably along the frontoparietal lobes). Superimposed bilateral subdural hygromas at these sites cannot be excluded. No midline shift. - An unchanged small subdural hygroma is also questioned along the right cerebellar hemisphere.  Assessment: 88 y.o. male who presented to the ED 2/15 for evaluation of worsening of chronic left-sided stroke deficits from a CVA in December of 2022. CTH was obtained on arrival without acute intracranial abnormality. Patient is not a candidate for TNK due to presentation outside of the thrombolytic therapy window, acute infarct within the past 3 months, and it is felt that patient's presentation is most consistent with recrudescence of old stroke symptoms rather than due to new stroke. Acute ischemia remains within the differential diagnosis list, will complete MRI brain for further evaluation.   Recommendations: - MRI brain without contrast - If acute stroke finding, will expand full stroke work up at that time. Con't current stroke meds. - Evaluation and management of metabolic / infectious derangements per EDP / primary team  Anibal Henderson, AGAC-NP [facility] Pager: [phone]  ATTENDING ATTESTATION: He is likely at his baseline NIHSS 3 from previous stroke so no evidence of LVO.  Dr. Reeves Forth evaluated pt independently, reviewed imaging, chart, labs.  CT reviewed personally by me. Discussed and formulated plan with the APP. Please see APP note above for details.      This patient is critically ill due to code stroke presentation, workup at risk for extension of his stroke and at significant risk of neurological worsening, death form heart failure, respiratory failure, recurrent stroke, bleeding from [facility], seizure, sepsis. This patient's care requires constant monitoring of vital signs, hemodynamics, respiratory and cardiac monitoring, review of multiple databases, neurological assessment, discussion with family,  other specialists and medical decision making of high complexity. I spent 35 minutes of neurocritical care time in the care of this patient.   Loree Shehata,MD

## 2021-05-19 NOTE — ED Notes (Addendum)
Family at bedside. 

## 2021-05-19 NOTE — ED Notes (Signed)
Patient transported to MRI 

## 2021-05-19 NOTE — Code Documentation (Signed)
Stroke Response Nurse Documentation Code Documentation  [name] is a 88 y.o. male arriving to [facility] ED via [facility] on 05/19/21 with past medical hx of CVA. On No antithrombotic. Code stroke was activated by [facility].   Patient from home where he was LKW at 1000 and now complaining of increased left sided weakness, slurred speech, facial droop. Patient spoke with daughter on the phone at [place] and was normal. A family member went to pick up pt to take him to therapy when they noticed worsening symptoms from previous stroke. He was normally able to walk with a walker but was dragging his left leg, had slurred speech and a facial droop. EMS was called and activated the code stroke. Pt taken to [facility].   Stroke team at the bedside on patient arrival. Labs drawn and patient cleared for CT by Dr. Melina Copa. CBG 140. Patient to CT with team. NIHSS 3, see documentation for details and code stroke times. Patient with left facial droop, left leg weakness, and dysarthria  on exam. The following imaging was completed:  CT head. Patient is not a candidate for IV Thrombolytic due to outside window, symptoms improving, and recent stroke. Patient is not a candidate for IR due to no LVO.   Care/Plan: q2x12 then q4 vitals/neuro checks, MRI, permissive HTN up to 220/120.   Bedside handoff with ED RN Kathlee Nations.    Elon Lomeli, Rande Brunt  Stroke Response RN

## 2021-05-19 NOTE — ED Notes (Signed)
Pt ambulated with walker without assistance. Steady gait

## 2021-05-19 NOTE — Telephone Encounter (Signed)
Patient was supposed to have therapy today, but his left leg is feeling heavy again.  Patient's wife would like to know what she should do.  They canceled the therapy appts today.  Please call her at [phone].

## 2021-05-19 NOTE — ED Provider Notes (Signed)
[place] EMERGENCY DEPARTMENT Provider Note   CSN: [id number] Arrival date & time: 05/19/21  1438     History  No chief complaint on file.   [name] is a 88 y.o. male.  He has a history of diabetes and prior stroke with left-sided deficits.  He was found by family to have increased left-sided weakness and slurred speech today.  He was a code stroke activation on arrival.  He had emergent head CT and neurologic evaluation.  Level 5 caveat secondary to dementia.  The history is provided by the patient and the EMS personnel.  Cerebrovascular Accident This is a recurrent problem. Pertinent negatives include no chest pain, no abdominal pain, no headaches and no shortness of breath. The symptoms are aggravated by walking. The symptoms are relieved by medications. He has tried nothing for the symptoms. The treatment provided no relief.      Home Medications Prior to Admission medications   Medication Sig Start Date End Date Taking? Authorizing Provider  acetaminophen (TYLENOL) 325 MG tablet Take 2 tablets (650 mg total) by mouth every 6 (six) hours as needed for mild pain (or Fever >/= 101). 04/01/21   Angiulli, Lavon Paganini, PA-C  aspirin EC 81 MG tablet Take 81 mg by mouth daily as needed for moderate pain.    [provider]  atorvastatin (LIPITOR) 20 MG tablet Take 1 tablet (20 mg total) by mouth daily. 04/23/21   Copland, Frederico Hamman, MD  Continuous Blood Gluc Receiver (FREESTYLE LIBRE 2 READER) DEVI Use to check blood sugar continous 05/05/21   Copland, Frederico Hamman, MD  Continuous Blood Gluc Sensor (FREESTYLE LIBRE 2 SENSOR) MISC Use to check blood sugar continuous 05/05/21   Copland, Frederico Hamman, MD  donepezil (ARICEPT) 10 MG tablet TAKE 1 TABLET BY MOUTH EVERYDAY AT BEDTIME 05/11/21   Copland, Frederico Hamman, MD  ferrous sulfate 325 (65 FE) MG tablet Take 1 tablet (325 mg total) by mouth daily with breakfast. 04/23/21   Copland, Frederico Hamman, MD  glipiZIDE (GLUCOTROL) 5 MG tablet  Take 1 tablet (5 mg total) by mouth 2 (two) times daily before a meal. 04/15/21   Copland, Frederico Hamman, MD  Multiple Vitamins-Minerals (ICAPS LUTEIN & ZEAXANTHIN PO) Take 1 capsule by mouth in the morning and at bedtime. I - Caps    [provider]  Multiple Vitamins-Minerals (ICAPS) CAPS Take by mouth.    [provider]  Omega-3 Fatty Acids (FISH OIL) 1000 MG CAPS Take 1 capsule (1,000 mg total) by mouth in the morning and at bedtime. 04/01/21   Angiulli, Lavon Paganini, PA-C  pantoprazole (PROTONIX) 40 MG tablet Take 1 tablet (40 mg total) by mouth daily. 04/23/21   Copland, Frederico Hamman, MD  polyethylene glycol (MIRALAX / GLYCOLAX) 17 g packet Take 17 g by mouth daily. 04/01/21   Angiulli, Lavon Paganini, PA-C  Propylene Glycol (SYSTANE BALANCE) 0.6 % SOLN Apply 1 drop to eye 2 (two) times daily as needed (dry eyes).    [provider]  senna-docusate (SENOKOT-S) 8.6-50 MG tablet Take 2 tablets by mouth 2 (two) times daily. 04/01/21   Angiulli, Lavon Paganini, PA-C  sertraline (ZOLOFT) 100 MG tablet Take 1 tablet (100 mg total) by mouth daily. 04/23/21   Copland, Frederico Hamman, MD  tamsulosin (FLOMAX) 0.4 MG CAPS capsule Take 1 capsule (0.4 mg total) by mouth daily after supper. 04/23/21   Copland, Frederico Hamman, MD  vitamin B-12 (CYANOCOBALAMIN) 1000 MCG tablet Take 1 tablet (1,000 mcg total) by mouth daily. 04/23/21   Copland, Frederico Hamman,  MD      Allergies    Sulfa antibiotics    Review of Systems   Review of Systems  Unable to perform ROS: Dementia  Respiratory:  Negative for shortness of breath.   Cardiovascular:  Negative for chest pain.  Gastrointestinal:  Negative for abdominal pain.  Neurological:  Negative for headaches.   Physical Exam Updated Vital Signs BP 137/72    Pulse 73    Temp 98.3 F (36.8 C) (Oral)    Resp 17    SpO2 (!) 85%  Physical Exam Vitals and nursing note reviewed.  Constitutional:      General: He is not in acute distress.    Appearance: Normal appearance. He is  well-developed.  HENT:     Head: Normocephalic and atraumatic.  Eyes:     Conjunctiva/sclera: Conjunctivae normal.  Cardiovascular:     Rate and Rhythm: Normal rate and regular rhythm.     Heart sounds: No murmur heard. Pulmonary:     Effort: Pulmonary effort is normal. No respiratory distress.     Breath sounds: Normal breath sounds.  Abdominal:     Palpations: Abdomen is soft.     Tenderness: There is no abdominal tenderness.  Musculoskeletal:        General: No swelling.     Cervical back: Neck supple.  Skin:    General: Skin is warm and dry.     Capillary Refill: Capillary refill takes less than 2 seconds.  Neurological:     Mental Status: He is alert.     Comments: He has weakness of left leg greater than left arm.  Question worse than baseline.  Otherwise following commands.    ED Results / Procedures / Treatments   Labs (all labs ordered are listed, but only abnormal results are displayed) Labs Reviewed  APTT - Abnormal; Notable for the following components:      Result Value   aPTT 37 (*)    All other components within normal limits  CBC - Abnormal; Notable for the following components:   RBC 3.79 (*)    Hemoglobin 12.4 (*)    MCV 103.2 (*)    Platelets 102 (*)    All other components within normal limits  DIFFERENTIAL - Abnormal; Notable for the following components:   Lymphs Abs 0.6 (*)    Abs Immature Granulocytes 0.15 (*)    All other components within normal limits  COMPREHENSIVE METABOLIC PANEL - Abnormal; Notable for the following components:   Glucose, Bld 139 (*)    Creatinine, Ser 1.46 (*)    Calcium 8.7 (*)    Total Protein 6.1 (*)    AST 12 (*)    GFR, Estimated 46 (*)    All other components within normal limits  URINALYSIS, ROUTINE W REFLEX MICROSCOPIC - Abnormal; Notable for the following components:   Color, Urine AMBER (*)    APPearance HAZY (*)    All other components within normal limits  I-STAT CHEM 8, ED - Abnormal; Notable for the  following components:   Creatinine, Ser 1.50 (*)    Glucose, Bld 135 (*)    Hemoglobin 12.9 (*)    HCT 38.0 (*)    All other components within normal limits  CBG MONITORING, ED - Abnormal; Notable for the following components:   Glucose-Capillary 140 (*)    All other components within normal limits  RESP PANEL BY RT-PCR (FLU A&B, COVID) [id number]  PROTIME-INR    EKG EKG Interpretation  Date/Time:  Wednesday May 19 2021 15:05:58 EST Ventricular Rate:  78 PR Interval:  198 QRS Duration: 142 QT Interval:  408 QTC Calculation: 465 R Axis:   102 Text Interpretation: Sinus rhythm Nonspecific intraventricular conduction delay No significant change since prior 12/22 Confirmed by Aletta Edouard [phone]) on 05/19/2021 3:09:32 PM  Radiology MR BRAIN WO CONTRAST  Result Date: 05/19/2021 CLINICAL DATA:  Acute neuro deficit. Left-sided weakness, slurred speech EXAM: MRI HEAD WITHOUT CONTRAST TECHNIQUE: Multiplanar, multiecho pulse sequences of the brain and surrounding structures were obtained without intravenous contrast. COMPARISON:  MRI head 03/07/2021.  CT head 05/19/2021 FINDINGS: Brain: Negative for acute infarct. Extensive cortical atrophy. Prominent subdural hygromas around the cerebral cortex bilaterally unchanged from prior studies. Negative for hydrocephalus. Mild chronic microvascular ischemic change in the white matter. Negative for hemorrhage or mass lesion. No midline shift. Vascular: Normal arterial flow voids. Skull and upper cervical spine: No focal skeletal lesion. Sinuses/Orbits: Paranasal sinuses clear. Bilateral cataract extraction Other: None IMPRESSION: Negative for acute infarct. 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 Electronically Signed   By: Franchot Gallo M.D.   On: 05/19/2021 17:41   DG Chest Port 1 View  Result Date: 05/19/2021 CLINICAL DATA:  Weakness. EXAM: PORTABLE CHEST 1 VIEW COMPARISON:  March 07, 2021. FINDINGS: Stable  cardiomediastinal silhouette. Right lung is clear. Postsurgical changes are noted in the left lower lung field with associated scarring and pleural thickening. Bony thorax is unremarkable. IMPRESSION: Stable chronic findings seen in left lung base. No acute abnormality is noted. Electronically Signed   By: Marijo Conception M.D.   On: 05/19/2021 15:33   CT HEAD CODE STROKE WO CONTRAST  Result Date: 05/19/2021 CLINICAL DATA:  Neuro deficit, acute, stroke suspected. Additional history provided: Left-sided weakness, slurred speech. EXAM: CT HEAD WITHOUT CONTRAST TECHNIQUE: Contiguous axial images were obtained from the base of the skull through the vertex without intravenous contrast. RADIATION DOSE REDUCTION: This exam was performed according to the departmental dose-optimization program which includes automated exposure control, adjustment of the mA and/or kV according to patient size and/or use of iterative reconstruction technique. COMPARISON:  MRI brain and MRA head 03/08/2021. FINDINGS: Brain: Unchanged from the prior brain MRI of 03/08/2021, there is advanced cerebral volume loss with symmetric prominence of the extra-axial CSF spaces (along the frontoparietal lobes). Superimposed chronic subdural hygromas are difficult to exclude. No midline shift. Mild patchy and ill-defined hypoattenuation within the cerebral white matter, nonspecific but compatible with chronic small vessel ischemic disease. Prominent perivascular space within the inferior left basal ganglia. Known chronic infarct within the ventral pons, better appreciated on the prior brain MRI of 03/07/2021 (acute at that time). A small chronic subdural hygroma is also questioned along the right cerebellar hemisphere, unchanged. There is no acute intracranial hemorrhage. No demarcated cortical infarct. No extra-axial fluid collection. No evidence of an intracranial mass. No midline shift. Vascular: No hyperdense vessel.  Atherosclerotic calcifications.  Skull: Normal. Negative for fracture or focal lesion. Sinuses/Orbits: Visualized orbits show no acute finding. No significant paranasal sinus disease. ASPECTS [facility] Stroke Program Early CT Score) - Ganglionic level infarction (caudate, lentiform nuclei, internal capsule, insula, M1-M3 cortex): 7 - Supraganglionic infarction (M4-M6 cortex): 3 Total score (0-10 with 10 being normal): 10 No acute intracranial hemorrhage or evidence of acute infarct. These results were called by telephone at the time of interpretation on 05/19/2021 at 3:05 pm to provider Dr. Melina Copa, who verbally acknowledged these results. IMPRESSION: No evidence of acute intracranial abnormality. Mild chronic  small vessel ischemic changes within the cerebral white matter. Known chronic infarct within the ventral pons. Unchanged from the brain MRI of 03/08/2021, there is advanced cerebral atrophy with symmetric prominence of the extra-axial CSF spaces (most notably along the frontoparietal lobes). Superimposed bilateral subdural hygromas at these sites cannot be excluded. No midline shift. An unchanged small subdural hygroma is also questioned along the right cerebellar hemisphere. Electronically Signed   By: Kellie Simmering D.O.   On: 05/19/2021 15:09    Procedures .Critical Care Performed by: Hayden Rasmussen, MD Authorized by: Hayden Rasmussen, MD   Critical care provider statement:    Critical care time (minutes):  45   Critical care time was exclusive of:  Separately billable procedures and treating other patients   Critical care was necessary to treat or prevent imminent or life-threatening deterioration of the following conditions:  CNS failure or compromise   Critical care was time spent personally by me on the following activities:  Development of treatment plan with patient or surrogate, discussions with consultants, evaluation of patient's response to treatment, examination of patient, obtaining history from patient or surrogate,  ordering and performing treatments and interventions, ordering and review of laboratory studies, ordering and review of radiographic studies, pulse oximetry, re-evaluation of patient's condition and review of old charts   I assumed direction of critical care for this patient from another provider in my specialty: no      Medications Ordered in ED Medications - No data to display   ED Course/ Medical Decision Making/ A&P Clinical Course as of 05/20/21 0833  Wed May 19, 2021  1539 Chest x-ray viewed and interpreted by me as possible atelectasis left base.  Awaiting radiology reading [MB]  1900 Patient ambulated in the department with a walker which is his baseline.  He and family are comfortable plan for discharge and outpatient follow-up with his neurologist. [MB]    Clinical Course User Index [MB] Hayden Rasmussen, MD                           Medical Decision Making Amount and/or Complexity of Data Reviewed Labs: ordered. Radiology: ordered.  This patient complains of increased weakness left side; this involves an extensive number of treatment Options and is a complaint that carries with it a high risk of complications and morbidity. The differential includes stroke, TIA, bleed, metabolic derangement, hypoglycemia, arrhythmia  I ordered, reviewed and interpreted labs, which included CBC with normal white count, hemoglobin low better than priors, chemistries fairly normal other than elevated creatinine stable from priors, glucose slightly elevated, COVID and flu negative, urinalysis without signs of infection I ordered imaging studies which included CT head chest x-ray MRI brain and I independently    visualized and interpreted imaging which showed no acute findings Additional history obtained from patient's wife and EMS Previous records obtained and reviewed in epic, patient admitted for acute stroke back in December I consulted neurology Dr. Reeves Forth and discussed lab and imaging  findings and discussed disposition.  Cardiac monitoring reviewed, patient in normal sinus rhythm Social determinants considered,  Critical Interventions: Patient requiring emergent work-up for acute neurologic condition possibly being interventional candidate.  After the interventions stated above, I reevaluated the patient and found patient symptoms to be improved.  He ambulated in the department with a walker at his baseline. Admission and further testing considered, patient does not require admission at this time.  Patient and family comfortable plan  for discharge and outpatient follow-up with their treating providers.  Return instructions discussed          Final Clinical Impression(s) / ED Diagnoses Final diagnoses:  Left-sided weakness    Rx / DC Orders ED Discharge Orders     None         Hayden Rasmussen, MD 05/20/21 [id number]

## 2021-05-19 NOTE — Discharge Instructions (Addendum)
You were seen in the emergency department for worsening of your left-sided weakness from your prior stroke.  You were evaluated by neurology and had an MRI that did not show any acute stroke.  Please continue your regular medications and follow-up with your outpatient treatment providers.  Return to the emergency department if any worsening or concerning symptoms.

## 2021-05-21 ENCOUNTER — Ambulatory Visit: Payer: Medicare HMO

## 2021-05-21 ENCOUNTER — Ambulatory Visit: Payer: Medicare HMO | Admitting: Occupational Therapy

## 2021-05-25 ENCOUNTER — Ambulatory Visit: Payer: Medicare HMO | Admitting: Occupational Therapy

## 2021-05-25 ENCOUNTER — Other Ambulatory Visit: Payer: Self-pay

## 2021-05-25 ENCOUNTER — Ambulatory Visit: Payer: Medicare HMO

## 2021-05-25 VITALS — BP 146/73 | HR 78

## 2021-05-25 DIAGNOSIS — R2689 Other abnormalities of gait and mobility: Secondary | ICD-10-CM

## 2021-05-25 DIAGNOSIS — M6281 Muscle weakness (generalized): Secondary | ICD-10-CM

## 2021-05-25 DIAGNOSIS — R278 Other lack of coordination: Secondary | ICD-10-CM | POA: Diagnosis not present

## 2021-05-25 DIAGNOSIS — I69354 Hemiplegia and hemiparesis following cerebral infarction affecting left non-dominant side: Secondary | ICD-10-CM

## 2021-05-25 DIAGNOSIS — R262 Difficulty in walking, not elsewhere classified: Secondary | ICD-10-CM

## 2021-05-25 DIAGNOSIS — R41842 Visuospatial deficit: Secondary | ICD-10-CM

## 2021-05-25 DIAGNOSIS — R41840 Attention and concentration deficit: Secondary | ICD-10-CM | POA: Diagnosis not present

## 2021-05-25 DIAGNOSIS — R2681 Unsteadiness on feet: Secondary | ICD-10-CM

## 2021-05-25 NOTE — Therapy (Signed)
[facility] [address] [place], [place], [address] Phone: [phone]   Fax:  [phone]  Occupational Therapy Treatment  Patient Details  Name: [name] MRN: [id number] Date of Birth: [birth year] No data recorded  Encounter Date: 05/25/2021   OT End of Session - 05/25/21 1307     Visit Number 9    Number of Visits 25    Date for OT Re-Evaluation 07/08/21    Authorization Type [employer]    Authorization - Visit Number 9    Progress Note Due on Visit 10    OT Start Time 1232    OT Stop Time 1310    OT Time Calculation (min) 38 min    Activity Tolerance Patient tolerated treatment well    Behavior During Therapy [facility] for tasks assessed/performed             Past Medical History:  Diagnosis Date   Adenocarcinoma of left lung, stage 1 ([place]) 2001   T1N0 stage I  adenoca left lung resected 01/03/00    Alzheimer's disease ([place])    CAD (coronary artery disease) 2014   a. 09/2012 Cath: LM nl, LAD 50-60p, D1 60-70 m, D1 50ost, LCX nl, RCA min irregs, EF 55-65%.   Generalized anxiety disorder 01/08/2007   Qualifier: Diagnosis of  By: Diona Browner MD, [name]     History of colonic polyps 07/25/2011   Macular degeneration of both eyes 01/21/2014   Nonmelanoma skin cancer 07/25/2011   Multiple lesions excised face/nose   Pericarditis    a. 09/2012 with effusion and tamponade, s/p window.  b.  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 SCC IN SITU TX CX3   SCC (squamous cell carcinoma) 04/12/2005   RIGHT NECK SUP. SCC IN SITU TX EXC   SCC (squamous cell carcinoma) 04/16/2013   LEFT TEMPLE SCC IN SITU TX CX3 5FU   SCC (squamous cell carcinoma) 04/16/2013   BELOW LEFT EYE SCC IN SITU TX CX3 5FU   SCC (squamous cell carcinoma) 04/16/2013   BELOW RIGHT EYE SCC IN SITU TX CX3 5FU   SCC (squamous cell carcinoma) 08/04/2014   LEFT CHEEK SCC IN SITU TX CX3 5FU   SCC (squamous cell carcinoma) 11/27/2018   LEFT TEMPLE SCC IN SITU TX WITH BX   SCC (squamous cell carcinoma)    SCC (squamous cell carcinoma)    SCC (squamous cell carcinoma)    SCC (squamous cell carcinoma) Well Diff 09/13/2016   Tip of Nose SCC WELL DIFF TX (MOH's), and RIGHT INNER EYE SUP. SCC IN SITU TX TO WATCH   SCC (squamous cell carcinoma) Well Diff 05/30/2017   Right Cheekbone (Cx3,5FU) and Under Left Eye (Cx3,5FU)  Squamous cell carcinoma in situ (SCCIS) 04/12/2005   Right Neck Inf (Cx3), Right Neck Sup (Exc), and Bulb of Nose (Cx3,Exc)   Squamous cell carcinoma in situ (SCCIS) 09/27/2005   Nose (Cx3,Exc)   Squamous cell carcinoma in situ (SCCIS) 02/18/2013   Below Left Eye (tx p bx)   Squamous cell carcinoma in situ (SCCIS) 04/16/2013   Left Temple (Cx3,5FU), Below Left Eye (Cx3,5FU), Below Right Eye (Cx3,5FU)   Squamous cell carcinoma in situ (SCCIS) 08/04/2014   Left Cheek (Cx3,5FU)   Squamous cell carcinoma in situ (SCCIS) 09/13/2016   Right Inner Eye Sup. 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   Type 2 diabetes mellitus  with vascular disease ([place]) 05/21/2007   Qualifier: Diagnosis of  By: Diona Browner MD, [name]     Type 2 DM with CKD stage 3 and hypertension ([place]) 07/16/2015   Unspecified essential hypertension     Past Surgical History:  Procedure Laterality Date   HERNIA REPAIR     LEFT HEART CATHETERIZATION WITH CORONARY ANGIOGRAM N/A 09/13/2012   Procedure: LEFT HEART CATHETERIZATION WITH CORONARY ANGIOGRAM;  Surgeon: Sherren Mocha, MD;  Location: [facility] CATH LAB;  Service: Cardiovascular;  Laterality: N/A;   LOBECTOMY  2001   upper left   PERICARDIAL TAP N/A 09/16/2012   Procedure: PERICARDIAL TAP;  Surgeon: Sinclair Grooms, MD;  Location: [facility] CATH LAB;  Service: Cardiovascular;  Laterality: N/A;   PROSTATECTOMY  2003   SUBXYPHOID PERICARDIAL WINDOW N/A 09/16/2012   Procedure: SUBXYPHOID PERICARDIAL WINDOW;  Surgeon: Rexene Alberts, MD;  Location: [facility];  Service: Thoracic;  Laterality: N/A;   TONSILLECTOMY      There were no vitals filed for this visit.   Subjective Assessment - 05/25/21 1525     Subjective  no pain    Pertinent History Pt is an 88 year old man admitted on 03/07/21 with L side weakness. MRI + for small foci ischemia R pons and L frontal periventricular white matter. PMH. Alzheimers dementia, DM, HTN, stage 3 CKD, lung ca, prostate ca, multiple skin cancers, CAD, anxiety, macular degeneration. Pt lives with wife who has hired caregiver, children currently assisting    Limitations HOH, macular degeneration    Special Tests Goes by [name].    Patient Stated Goals get left  arm working, increase independence    Currently in Pain? No/denies                 Treatment: closed chain shoulder flexion, and chest press in supine, min v.c Pt ambulated to table with RW with supervision Pt placed large and medium pegs in semicircle with LUE, min difficulty related to coordination and vision. Pt met his box/ blocks goal for LUE.  Flipping playing cards with LUE for increased fine motor  coordination, min difficulty/ v.c Pt required several rest breaks, he fatigues quickly. Cutting food, holding fork with LUE and cutting with RUE, min-mod difficulty with control of fork, min v.c                  OT Short Term Goals - 05/25/21 1241       OT SHORT TERM GOAL #1  Title I with inital HEP.    Time 4    Period Weeks    Status Achieved    Target Date 05/13/21      OT SHORT TERM GOAL #2   Title Pt will donn shirt with set up and supervision and pants with min A.    Time 4    Period Weeks    Status Achieved      OT SHORT TERM GOAL #3   Title Pt will demonstrate 90* shoulder flexion in prep for functional reach with LUE with no more than min compensations.    Time 4    Period Weeks    Status On-going   23* with mod compensation     OT SHORT TERM GOAL #4   Title Pt will verbalize understanding of memory compensation strategies.    Time 4    Period Weeks    Status On-going   issued may benefit from reinforcement     OT SHORT TERM GOAL #5   Title Pt will demonstrate improved LUE functional use as evidenced by increasing box/ blocks score by 5 blocks    Baseline R 43 blocks, LUE 28 blocks    Time 4    Period Weeks    Status Achieved   34  blocks 05/25/21     OT SHORT TERM GOAL #6   Title Pt will verbalize understanding of adapted strategies to maximize safety and I with ADLs/ IADLs .    (NV:BTYOMAY food)    Time 4    Period Weeks    Status On-going   Pt practiced cutting food, min difficulty keeping LUE on fork, min v.c              OT Long Term Goals - 04/21/21 1611       OT LONG TERM GOAL #1   Title Pt will perform all basic ADLS with supervision.    Time 12    Period Weeks    Status On-going    Target Date 07/08/21      OT LONG TERM GOAL #2   Title Pt will demonstrate ability to retrieve a lightweight object at 100* with LUE    Time 12    Period Weeks    Status On-going      OT LONG TERM GOAL #3   Title Pt will demonstrate  improved fine motor coordination for ADLs  as evidenced by placing 9 pegs in 90 secs or less for 9 hole peg test.    Time 12    Period Weeks    Status On-going      OT LONG TERM GOAL #4   Title Pt will increase LUE grip strength by 5 lbs for increased LUE functional use.    Baseline RUE 65.9 LUE 33.7    Time 12    Period Weeks    Status On-going      OT LONG TERM GOAL #5   Title Pt will perform simple beverage prep with supervision.    Time 12    Period Weeks    Status On-going                   Plan - 05/25/21 1308     Clinical Impression Statement Pt returns to OT after missing several visits as he thought that he was having another CVA. Pt was seen at the ED and did not have evidencece of a new CVA per diagnositic testing.    OT Occupational Profile  and History Detailed Assessment- Review of Records and additional review of physical, cognitive, psychosocial history related to current functional performance    Occupational performance deficits (Please refer to evaluation for details): ADL's;IADL's;Rest and Sleep;Leisure;Social Participation    Body Structure / Function / Physical Skills ADL;UE functional use;Endurance;Balance;Flexibility;Pain;Vision;FMC;Gait;ROM;GMC;Coordination;Decreased knowledge of precautions;Decreased knowledge of use of DME;IADL;Strength;Dexterity;Mobility    Rehab Potential Good    Clinical Decision Making Several treatment options, min-mod task modification necessary   visual and cognitive deficits, decrease coordiantion   Comorbidities Affecting Occupational Performance: May have comorbidities impacting occupational performance   Alzheimer's, macular degeneration, CVA   Modification or Assistance to Complete Evaluation  Min-Moderate modification of tasks or assist with assess necessary to complete eval   visual deficits, extra cueing and  assist required   OT Frequency 2x / week    OT Duration 12 weeks    OT Treatment/Interventions Self-care/ADL  training;Ultrasound;Visual/perceptual remediation/compensation;Patient/family education;DME and/or AE instruction;Aquatic Therapy;Paraffin;Passive range of motion;Balance training;Gait Training;Stair Training;Fluidtherapy;Cryotherapy;Electrical Stimulation;Therapist, nutritional;Therapeutic activities;Manual Therapy;Therapeutic exercise;Splinting;Moist Heat;Neuromuscular education;Cognitive remediation/compensation    Plan work towards unmet goals, NMR    Consulted and Agree with Plan of Care Patient             Patient will benefit from skilled therapeutic intervention in order to improve the following deficits and impairments:   Body Structure / Function / Physical Skills: ADL, UE functional use, Endurance, Balance, Flexibility, Pain, Vision, FMC, Gait, ROM, GMC, Coordination, Decreased knowledge of precautions, Decreased knowledge of use of DME, IADL, Strength, Dexterity, Mobility       Visit Diagnosis: Hemiplegia and hemiparesis following cerebral infarction affecting left non-dominant side (HCC)  Other abnormalities of gait and mobility  Unsteadiness on feet  Muscle weakness (generalized)  Other lack of coordination  Attention and concentration deficit  Visuospatial deficit    Problem List Patient Active Problem List   Diagnosis Date Noted   Right pontine cerebrovascular accident ([place]) 03/12/2021   Acute ischemic stroke ([place]) 03/08/2021   Hyperkalemia 03/08/2021   Hypomagnesemia 03/08/2021   GERD (gastroesophageal reflux disease) 03/08/2021   Acute CVA (cerebrovascular accident) ([place]) 03/08/2021   COVID-19 01/01/2021   Sensorineural hearing loss (SNHL) of both ears 05/15/2017   Tinnitus, bilateral 05/15/2017   Cough 06/08/2015   Macular degeneration of both eyes 01/21/2014   Tobacco abuse, 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/08/2013   CAD (coronary artery disease)    Pericardial tamponade 09/17/2012   Acute pericarditis, unspecified 09/13/2012   Adenocarcinoma of left lung,  stage 1 ([place]) 07/25/2011   Cancer of prostate w/med recur risk (T2b-c or Gleason 7 or PSA 10-20) ([place]) 07/25/2011   Nonmelanoma skin cancer 07/25/2011   History of colonic polyps 07/25/2011   Type 2 diabetes mellitus with vascular disease ([place]) 05/21/2007   HYPERCHOLESTEROLEMIA 05/21/2007   CARCINOMA, SKIN, SQUAMOUS CELL 01/08/2007   Generalized anxiety disorder 01/08/2007   ALZHEIMER'S DISEASE, MILD 01/08/2007   Essential hypertension 01/08/2007    Monicia Tse, OT 05/25/2021, 3:25 PM  [facility] [address] [place], [place], [address] Phone: [phone]   Fax:  [phone]  Name: [name] MRN: [id number] Date of Birth: 11-30-1930

## 2021-05-25 NOTE — Therapy (Signed)
[facility] [address] [place], [place], [address] Phone: [phone]   Fax:  [phone]  Physical Therapy Treatment/Re-Certification/Progress Note  Patient Details  Name: [name] MRN: [id number] Date of Birth: 1931/01/07 Referring Provider (PT): Cathlyn Parsons, PA-C  Physical Therapy Progress Note   Dates of Reporting Period: 04/14/21 - 05/25/21  See Note below for Objective Data and Assessment of Progress/Goals.  Thank you for the referral of this patient. Guillermina Castillo, PT, DPT  Encounter Date: 05/25/2021   PT End of Session - 05/25/21 1139     Visit Number 9    Number of Visits 24    Date for PT Re-Evaluation 07/23/21    Authorization Type [employer]    Progress Note Due on Visit 19   progress note completed on 9th visit   PT Start Time 1142    PT Stop Time 1225    PT Time Calculation (min) 43 min    Equipment Utilized During Treatment Gait belt    Activity Tolerance Patient tolerated treatment well    Behavior During Therapy WFL for tasks assessed/performed             Past Medical History:  Diagnosis Date   Adenocarcinoma of left lung, stage 1 ([place]) 2001   T1N0 stage I  adenoca left lung resected 01/03/00    Alzheimer's disease ([place])    CAD (coronary artery disease) 2014   a. 09/2012 Cath: LM nl, LAD 50-60p, D1 60-70 m, D1 50ost, LCX nl, RCA min irregs, EF 55-65%.   Generalized anxiety disorder 01/08/2007   Qualifier: Diagnosis of  By: [name], [name]     History of colonic polyps 07/25/2011   Macular degeneration of both eyes 01/21/2014   Nonmelanoma skin cancer 07/25/2011   Multiple lesions excised face/nose   Pericarditis    a. 09/2012 with effusion and tamponade, s/p window.  b.  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 SCC IN SITU TX CX3   SCC (squamous cell carcinoma) 04/12/2005   RIGHT NECK SUP. SCC IN SITU TX EXC   SCC (squamous cell carcinoma) 04/16/2013   LEFT TEMPLE SCC IN SITU TX CX3 5FU   SCC (squamous cell carcinoma) 04/16/2013   BELOW LEFT EYE SCC IN SITU TX CX3 5FU   SCC (squamous cell carcinoma) 04/16/2013   BELOW RIGHT EYE SCC IN SITU TX CX3 5FU   SCC (squamous cell carcinoma) 08/04/2014   LEFT CHEEK SCC IN SITU TX CX3 5FU   SCC (squamous cell carcinoma) 11/27/2018   LEFT TEMPLE SCC IN SITU TX WITH BX   SCC (squamous cell carcinoma)    SCC (squamous cell carcinoma)    SCC (squamous cell carcinoma)  SCC (squamous cell carcinoma) Well Diff 09/13/2016   Tip of Nose SCC WELL DIFF TX (MOH's), and RIGHT INNER EYE SUP. SCC IN SITU TX TO WATCH   SCC (squamous cell carcinoma) Well Diff 05/30/2017   Right Cheekbone (Cx3,5FU) and Under Left Eye (Cx3,5FU)   Squamous cell carcinoma in situ (SCCIS) 04/12/2005   Right Neck Inf (Cx3), Right Neck Sup (Exc), and Bulb of Nose (Cx3,Exc)   Squamous cell carcinoma in situ (SCCIS) 09/27/2005   Nose (Cx3,Exc)   Squamous cell carcinoma in situ (SCCIS) 02/18/2013   Below Left Eye (tx p bx)   Squamous cell carcinoma in situ (SCCIS) 04/16/2013   Left Temple (Cx3,5FU), Below Left Eye (Cx3,5FU), Below Right Eye (Cx3,5FU)   Squamous cell carcinoma in situ (SCCIS) 08/04/2014   Left Cheek (Cx3,5FU)   Squamous cell carcinoma in situ (SCCIS) 09/13/2016   Right Inner Eye Sup. (Watch)   Squamous cell  carcinoma in situ (SCCIS) Focal 08/24/2008   Right Outer Brow (tx p bx) and Below Left Eye (Cx3,5FU)   Squamous cell carcinoma in situ (SCCIS) Hypertrophic 10/25/2006   Right Elbow (Cx3,5FU)   Squamous cell carcinoma of skin 09/27/2005   NOSE SCC IN SITU TC CX3 5FU   Tobacco abuse, 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/08/2013   Type 2 diabetes mellitus with vascular disease ([place]) 05/21/2007   Qualifier: Diagnosis of  By: [name], [name]     Type 2 DM with CKD stage 3 and hypertension ([place]) 07/16/2015   Unspecified essential hypertension     Past Surgical History:  Procedure Laterality Date   HERNIA REPAIR     LEFT HEART CATHETERIZATION WITH CORONARY ANGIOGRAM N/A 09/13/2012   Procedure: LEFT HEART CATHETERIZATION WITH CORONARY ANGIOGRAM;  Surgeon: Sherren Mocha, Castillo;  Location[facility] CATH LAB;  Service: Cardiovascular;  Laterality: N/A;   LOBECTOMY  2001   upper left   PERICARDIAL TAP N/A 09/16/2012   Procedure: PERICARDIAL TAP;  Surgeon: Sinclair Grooms, Castillo;  Location: [facility] CATH LAB;  Service: Cardiovascular;  Laterality: N/A;   PROSTATECTOMY  2003   SUBXYPHOID PERICARDIAL WINDOW N/A 09/16/2012   Procedure: SUBXYPHOID PERICARDIAL WINDOW;  Surgeon: Rexene Alberts, Castillo;  Location: [facility];  Service: Thoracic;  Laterality: N/A;   TONSILLECTOMY      Vitals:   05/25/21 1147  BP: (!) 146/73  Pulse: 78     Subjective Assessment - 05/25/21 1138     Subjective Had ED visit due to thought of another possible CVA. No acute findings noted on imaging in chart. Reports that the L side still feels heavy but better than it was on the day he went to ED. Reports BP has been good, Blood Sugar running a little higher (around 200). No falls. No pain.    Patient is accompained by: Family member    Pertinent History R pontine CVA 03/07/21. History of Alzheimer's dementia maintained on Aricept type 2 diabetes mellitus hypertension CKD stage III macular degeneration adenocarcinoma left lung with upper lobe resection 2001 prostate  cancer with prostatectomy    Limitations Walking;Standing;House hold activities    How long can you sit comfortably? n/a    How long can you stand comfortably? As long as holding on to something    How long can you walk comfortably? Mostly in the house    Patient Stated Goals Pt: walk again without RW. Family: pt amb ind to bathroom (currently requires assist for safety)    Currently in Pain? No/denies  [facility] PT Assessment - 05/25/21 0001       Assessment   Medical Diagnosis R Pontine CVA    Referring Provider (PT) Angiulli, Lavon Paganini, PA-C      Transfers   Transfers Sit to Stand;Stand to Sit    Sit to Stand 5: Supervision    Five time sit to stand comments  18.75 secs with BUE support from mat; supervision level. Cues to stand upright. Reports increased fatigue with completion, 5/10.    Stand to Sit 5: Supervision      Ambulation/Gait   Ambulation/Gait Yes    Ambulation/Gait Assistance 5: Supervision;4: Min guard    Ambulation/Gait Assistance Details Completed ambulation in therapy gym x 300 ft prior to patient needing rest break, fatigue reported 8/10. Mild SOB noted with heavy breathing requiring seated break. Vitals stable    Ambulation Distance (Feet) 300 Feet    Assistive device Rolling walker    Gait Pattern Step-through pattern;Decreased dorsiflexion - left;Decreased stance time - left;Lateral trunk lean to right;Poor foot clearance - left    Ambulation Surface Level;Indoor    Gait velocity 24.03 secs = 1.36 ft/sec   with use of RW     Standardized Balance Assessment   Standardized Balance Assessment Berg Balance Test;Timed Up and Go Test      Berg Balance Test   Sit to Stand Able to stand  independently using hands    Standing Unsupported Able to stand safely 2 minutes    Sitting with Back Unsupported but Feet Supported on Floor or Stool Able to sit safely and securely 2 minutes    Stand to Sit Controls descent by using hands    Transfers Able to  transfer safely, definite need of hands    Standing Unsupported with Eyes Closed Able to stand 10 seconds safely    Standing Unsupported with Feet Together Able to place feet together independently and stand for 1 minute with supervision    From Standing, Reach Forward with Outstretched Arm Can reach confidently >25 cm (10")   10"   From Standing Position, Pick up Object from [place] to pick up shoe safely and easily    From Standing Position, Turn to Look Behind Over each Shoulder Looks behind one side only/other side shows less weight shift    Turn 360 Degrees Needs close supervision or verbal cueing    Standing Unsupported, Alternately Place Feet on Step/Stool Needs assistance to keep from falling or unable to try    Standing Unsupported, One Foot in Front Able to take small step independently and hold 30 seconds    Standing on One Leg Tries to lift leg/unable to hold 3 seconds but remains standing independently    Total Score 39    Berg comment: BP after completion of Berg Balance: 151/78, HR: 83. Intermittent seated rest break required      Timed Up and Go Test   TUG Normal TUG    Normal TUG (seconds) 18.61    TUG Comments with use of RW; supervision/CGA                PT Education - 05/25/21 1227     Education Details Progress toward goals; scores with recent weakness/ED visit; Plan to add additional 4 more weeks    Person(s) Educated Patient    Methods Explanation    Comprehension Verbalized understanding              PT Short Term Goals - 05/13/21 1020  PT SHORT TERM GOAL #1   Title Pt will perform exercises at home at least 3x/wk with supervision as needed    Baseline reports completed 2-3x/weekly and independence with no assitance required    Time 4    Period Weeks    Status Achieved    Target Date 05/12/21      PT SHORT TERM GOAL #2   Title Pt will have improved 5x STS to </=13 sec    Baseline 15.28 sec on eval; 13.97 secs with UE support     Time 4    Period Weeks    Status Partially Met    Target Date 05/12/21      PT SHORT TERM GOAL #3   Title Pt will be mod independent with ambulating to/from bathroom for safer home mobility    Baseline Mod I with household mobility    Time 4    Period Weeks    Status Achieved    Target Date 05/12/21      PT SHORT TERM GOAL #4   Title Pt will have improved Berg Balance Score to at least 42/56    Baseline 36/56; 38/56    Time 4    Period Weeks    Status Not Met    Target Date 05/12/21      PT SHORT TERM GOAL #5   Title Pt will demo improved TUG score to </=20 sec for decreased fall risk    Baseline 25.09 sec on eval; 16.59 secs with RW    Time 4    Period Weeks    Status Achieved    Target Date 05/12/21               PT Long Term Goals - 04/14/21 1515       PT LONG TERM GOAL #1   Title Pt will be independent with maintaining exercises at home    Time 8    Period Weeks    Status New    Target Date 06/09/21      PT LONG TERM GOAL #2   Title Pt will be able to amb with LRAD mod I x 800' for safe limited community amb    Time 8    Period Weeks    Status New    Target Date 06/09/21      PT LONG TERM GOAL #3   Title Pt will demo improved Berg Balance Score of at least 45/56 for decreased fall risk    Time 8    Period Weeks    Status New    Target Date 06/09/21      PT LONG TERM GOAL #4   Title Pt will have improved DGI to at least 19/24 to demo decreased fall risk    Baseline 13/24    Time 8    Period Weeks    Status New    Target Date 06/09/21      PT LONG TERM GOAL #5   Title Pt will have improved FOTO score of 65    Baseline 49    Time 8    Period Weeks    Status New    Target Date 06/09/21            Updated Short Term Goals:  PT Short Term Goals - 05/25/21 1240       PT SHORT TERM GOAL #1   Title Pt will report independence with progressive HEP and report completing >/= 3x/wk with supervision as needed (ALL STGs  Due: 06/25/21)     Baseline reports completed 2-3x/weekly and independence; will benefit from progressive HEP    Time 4    Period Weeks    Status Revised    Target Date 06/25/21      PT SHORT TERM GOAL #2   Title Pt will have improved 5x STS to </=15 sec to demo improved balance    Baseline 18.75 secs on 2/21    Time 4    Period Weeks    Status Revised      PT SHORT TERM GOAL #3   Title Pt will be able to ambulate >/= 400 ft with RW without rest break to demo improved endurance and household mobility    Baseline 300 ft, 8/10 fatigue    Time 4    Period Weeks    Status Revised      PT SHORT TERM GOAL #4   Title Pt will have improved Berg Balance Score to at least 42/56    Baseline 36/56; 38/56; 39/56    Time 4    Period Weeks    Status Revised      PT SHORT TERM GOAL #5   Title Pt will demo improved TUG score to </=15 sec for decreased fall risk    Baseline 25.09 sec on eval; 16.59 secs with RW; 18.60 secs    Time 4    Period Weeks    Status Revised    Target Date --              Updated Long Term Goals:    PT Long Term Goals - 05/25/21 1244       PT LONG TERM GOAL #1   Title Pt will be independent with final and progressive HEP (All LTGs Due: 07/23/21)    Baseline HEP established, not completing frequently    Time 8    Period Weeks    Status New    Target Date 07/23/21      PT LONG TERM GOAL #2   Title Pt will be able to amb with LRAD mod I x 800' indoors for improved community mobility and activity tolerance    Baseline 300 ft with RW    Time 8    Period Weeks    Status Revised      PT LONG TERM GOAL #3   Title Pt will demo improved Berg Balance Score of at least 45/56 for decreased fall risk    Baseline 39/56    Time 8    Period Weeks    Status New      PT LONG TERM GOAL #4   Title Pt will improve gait speed to >/= 2.0 ft/sec to demo improved community mobility    Baseline 1.36 ft/sec    Time 8    Period Weeks    Status New      PT LONG TERM GOAL #5   Title  Pt will have improved FOTO score of 65    Baseline 49    Time 8    Period Weeks    Status New    Target Date 06/09/21      Additional Long Term Goals   Additional Long Term Goals Yes      PT LONG TERM GOAL #6   Title Pt will improve TUG to </= 12 seconds with LRAD to demo reduced fall risk    Baseline 18.60 secs    Time 8    Period Weeks    Status New  PT LONG TERM GOAL #7   Title Pt will improve 5x sit <> stand to </= 12 seconds to demo improved balance    Baseline 18.75 secs    Time 8    Period Weeks    Status New                  Plan - 05/25/21 1139     Clinical Impression Statement Completed reassesment of patient's goals to determine differences in baseline due to recent ED visit for possible CVA. Imaging showed no acute findings. Today patient completed 5x sit <> stand in 18.75 seconds, Berg Balance score of 39/56, and TUG time of 18.60 secs. Patient showing decline in 5x sit <> stand and TUG time after recent ED visit, however no change in Berg Balance score. Patient is currently ambulating at 1.36 ft/sec with RW. Continue to demo increased weakness on L side > R side. Patient will continue to benefit from skilled PT services to address impairments and continue to progress toward updated LTGs.    Personal Factors and Comorbidities Age;Fitness;Time since onset of injury/illness/exacerbation    Examination-Activity Limitations Locomotion Level;Bathing;Squat;Stairs;Stand;Toileting;Transfers;Bed Mobility;Caring for Others    Examination-Participation Restrictions Meal Prep;Cleaning;Community Activity;Laundry;Shop    Stability/Clinical Decision Making Evolving/Moderate complexity    Rehab Potential Good    PT Frequency 2x / week    PT Duration 8 weeks    PT Treatment/Interventions ADLs/Self Care Home Management;Aquatic Therapy;Electrical Stimulation;Moist Heat;DME Instruction;Gait training;Stair training;Functional mobility training;Therapeutic  activities;Therapeutic exercise;Balance training;Neuromuscular re-education;Patient/family education;Orthotic Fit/Training;Manual techniques;Passive range of motion;Dry needling;Taping    PT Next Visit Plan Review HEP and update, may need to break exercises up for improved compliance. Continue to work on standing balance, gait training. LLE hip strengthening.    PT Home Exercise Plan Access Code [email or web address]    Consulted and Agree with Plan of Care Patient;Family member/caregiver             Patient will benefit from skilled therapeutic intervention in order to improve the following deficits and impairments:  Abnormal gait, Decreased coordination, Impaired tone, Decreased endurance, Impaired UE functional use, Decreased activity tolerance, Decreased balance, Improper body mechanics, Decreased mobility, Decreased strength, Postural dysfunction  Visit Diagnosis: Hemiplegia and hemiparesis following cerebral infarction affecting left non-dominant side (HCC)  Other abnormalities of gait and mobility  Difficulty in walking, not elsewhere classified  Unsteadiness on feet  Muscle weakness (generalized)     Problem List Patient Active Problem List   Diagnosis Date Noted   Right pontine cerebrovascular accident ([place]) 03/12/2021   Acute ischemic stroke ([place]) 03/08/2021   Hyperkalemia 03/08/2021   Hypomagnesemia 03/08/2021   GERD (gastroesophageal reflux disease) 03/08/2021   Acute CVA (cerebrovascular accident) ([place]) 03/08/2021   COVID-19 01/01/2021   Sensorineural hearing loss (SNHL) of both ears 05/15/2017   Tinnitus, bilateral 05/15/2017   Cough 06/08/2015   Macular degeneration of both eyes 01/21/2014   Tobacco abuse, 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/08/2013   CAD (coronary artery disease)    Pericardial tamponade 09/17/2012   Acute pericarditis, unspecified 09/13/2012   Adenocarcinoma of left lung, stage 1 ([place]) 07/25/2011   Cancer of prostate w/med recur risk (T2b-c or Gleason 7 or PSA 10-20)  ([place]) 07/25/2011   Nonmelanoma skin cancer 07/25/2011   History of colonic polyps 07/25/2011   Type 2 diabetes mellitus with vascular disease ([place]) 05/21/2007   HYPERCHOLESTEROLEMIA 05/21/2007   CARCINOMA, SKIN, SQUAMOUS CELL 01/08/2007   Generalized anxiety disorder 01/08/2007   ALZHEIMER'S DISEASE, MILD 01/08/2007   Essential hypertension 01/08/2007  Jones Bales, PT, DPT 05/25/2021, 12:36 PM  [facility] [address] [place], [place], [address] Phone: [phone]   Fax:  [phone]  Name: RUNE MENDEZ MRN: [id number] Date of Birth: 1930-05-22

## 2021-05-26 ENCOUNTER — Ambulatory Visit: Payer: Medicare HMO | Admitting: Dermatology

## 2021-05-26 DIAGNOSIS — Z85828 Personal history of other malignant neoplasm of skin: Secondary | ICD-10-CM

## 2021-05-26 DIAGNOSIS — L570 Actinic keratosis: Secondary | ICD-10-CM | POA: Diagnosis not present

## 2021-05-27 ENCOUNTER — Encounter: Payer: Self-pay | Admitting: Neurology

## 2021-05-27 ENCOUNTER — Ambulatory Visit: Payer: Medicare HMO | Admitting: Neurology

## 2021-05-27 VITALS — BP 126/71 | HR 91 | Ht 69.0 in | Wt 176.0 lb

## 2021-05-27 DIAGNOSIS — I6350 Cerebral infarction due to unspecified occlusion or stenosis of unspecified cerebral artery: Secondary | ICD-10-CM | POA: Diagnosis not present

## 2021-05-27 DIAGNOSIS — I69354 Hemiplegia and hemiparesis following cerebral infarction affecting left non-dominant side: Secondary | ICD-10-CM | POA: Diagnosis not present

## 2021-05-27 NOTE — Patient Instructions (Signed)
I had a long d/w patient about his recent stroke, risk for recurrent stroke/TIAs, personally independently reviewed imaging studies and stroke evaluation results and answered questions.Continue aspirin 81 mg daily  for secondary stroke prevention and maintain strict control of hypertension with blood pressure goal below 130/90, diabetes with hemoglobin A1c goal below 6.5% and lipids with LDL cholesterol goal below 70 mg/dL. I also advised the patient to eat a healthy diet with plenty of whole grains, cereals, fruits and vegetables, exercise regularly and maintain ideal body weight .he was advised to use his walker at all times and we discussed fall safety precautions.  Followup in the future with my nurse practitioner in 6 months or call earlier if necessary. Stroke Prevention Some medical conditions and behaviors can lead to a higher chance of having a stroke. You can help prevent a stroke by eating healthy, exercising, not smoking, and managing any medical conditions you have. Stroke is a leading cause of functional impairment. Primary prevention is particularly important because a majority of strokes are first-time events. Stroke changes the lives of not only those who experience a stroke but also their family and other caregivers. How can this condition affect me? A stroke is a medical emergency and should be treated right away. A stroke can lead to brain damage and can sometimes be life-threatening. If a person gets medical treatment right away, there is a better chance of surviving and recovering from a stroke. What can increase my risk? The following medical conditions may increase your risk of a stroke: Cardiovascular disease. High blood pressure (hypertension). Diabetes. High cholesterol. Sickle cell disease. Blood clotting disorders (hypercoagulable state). Obesity. Sleep disorders (obstructive sleep apnea). Other risk factors include: Being older than age 28. Having a history of blood  clots, stroke, or mini-stroke (transient ischemic attack, TIA). Genetic factors, such as race, ethnicity, or a family history of stroke. Smoking cigarettes or using other tobacco products. Taking birth control pills, especially if you also use tobacco. Heavy use of alcohol or drugs, especially cocaine and methamphetamine. Physical inactivity. What actions can I take to prevent this? Manage your health conditions High cholesterol levels. Eating a healthy diet is important for preventing high cholesterol. If cholesterol cannot be managed through diet alone, you may need to take medicines. Take any prescribed medicines to control your cholesterol as told by your health care provider. Hypertension. To reduce your risk of stroke, try to keep your blood pressure below 130/80. Eating a healthy diet and exercising regularly are important for controlling blood pressure. If these steps are not enough to manage your blood pressure, you may need to take medicines. Take any prescribed medicines to control hypertension as told by your health care provider. Ask your health care provider if you should monitor your blood pressure at home. Have your blood pressure checked every year, even if your blood pressure is normal. Blood pressure increases with age and some medical conditions. Diabetes. Eating a healthy diet and exercising regularly are important parts of managing your blood sugar (glucose). If your blood sugar cannot be managed through diet and exercise, you may need to take medicines. Take any prescribed medicines to control your diabetes as told by your health care provider. Get evaluated for obstructive sleep apnea. Talk to your health care provider about getting a sleep evaluation if you snore a lot or have excessive sleepiness. Make sure that any other medical conditions you have, such as atrial fibrillation or atherosclerosis, are managed. Nutrition Follow instructions from your health care  provider about what to eat or drink to help manage your health condition. These instructions may include: Reducing your daily calorie intake. Limiting how much salt (sodium) you use to 1,500 milligrams (mg) each day. Using only healthy fats for cooking, such as olive oil, canola oil, or sunflower oil. Eating healthy foods. You can do this by: Choosing foods that are high in fiber, such as whole grains, and fresh fruits and vegetables. Eating at least 5 servings of fruits and vegetables a day. Try to fill one-half of your plate with fruits and vegetables at each meal. Choosing lean protein foods, such as lean cuts of meat, poultry without skin, fish, tofu, beans, and nuts. Eating low-fat dairy products. Avoiding foods that are high in sodium. This can help lower blood pressure. Avoiding foods that have saturated fat, trans fat, and cholesterol. This can help prevent high cholesterol. Avoiding processed and prepared foods. Counting your daily carbohydrate intake.  Lifestyle If you drink alcohol: Limit how much you have to: 0-1 drink a day for women who are not pregnant. 0-2 drinks a day for men. Know how much alcohol is in your drink. In the [place] one drink equals one 12 oz bottle of beer (327mL), one 5 oz glass of wine (166mL), or one 1 oz glass of hard liquor (38mL). Do not use any products that contain nicotine or tobacco. These products include cigarettes, chewing tobacco, and vaping devices, such as e-cigarettes. If you need help quitting, ask your health care provider. Avoid secondhand smoke. Do not use drugs. Activity  Try to stay at a healthy weight. Get at least 30 minutes of exercise on most days, such as: Fast walking. Biking. Swimming. Medicines Take over-the-counter and prescription medicines only as told by your health care provider. Aspirin or blood thinners (antiplatelets or anticoagulants) may be recommended to reduce your risk of forming blood clots that can lead to  stroke. Avoid taking birth control pills. Talk to your health care provider about the risks of taking birth control pills if: You are over 36 years old. You smoke. You get very bad headaches. You have had a blood clot. Where to find more information American Stroke Association: www.strokeassociation.org Get help right away if: You or a loved one has any symptoms of a stroke. "BE FAST" is an easy way to remember the main warning signs of a stroke: B - Balance. Signs are dizziness, sudden trouble walking, or loss of balance. E - Eyes. Signs are trouble seeing or a sudden change in vision. F - Face. Signs are sudden weakness or numbness of the face, or the face or eyelid drooping on one side. A - Arms. Signs are weakness or numbness in an arm. This happens suddenly and usually on one side of the body. S - Speech. Signs are sudden trouble speaking, slurred speech, or trouble understanding what people say. T - Time. Time to call emergency services. Write down what time symptoms started. You or a loved one has other signs of a stroke, such as: A sudden, severe headache with no known cause. Nausea or vomiting. Seizure. These symptoms may represent a serious problem that is an emergency. Do not wait to see if the symptoms will go away. Get medical help right away. Call your local emergency services (911 in the [place] Do not drive yourself to the hospital. Summary You can help to prevent a stroke by eating healthy, exercising, not smoking, limiting alcohol intake, and managing any medical conditions you may have. Do not  use any products that contain nicotine or tobacco. These include cigarettes, chewing tobacco, and vaping devices, such as e-cigarettes. If you need help quitting, ask your health care provider. Remember "BE FAST" for warning signs of a stroke. Get help right away if you or a loved one has any of these signs. This information is not intended to replace advice given to you by your  health care provider. Make sure you discuss any questions you have with your health care provider. Document Revised: 10/21/2019 Document Reviewed: 10/21/2019 Elsevier Patient Education  [employer].

## 2021-05-27 NOTE — Progress Notes (Signed)
[facility] [address] [place]. [place] [address] [phone]       OFFICE FOLLOW-UP NOTE  [name] Date of Birth:  May 01, 1930 Medical Record Number:  [id number]   HPI: [name] is a 88 year old pleasant Caucasian male seen today for initial office  visit following hospital admission for stroke in December 2022.  He has past medical history of lung adenocarcinoma, Alzheimer's disease, coronary artery disease, multiple skin cancer, hypertension, CKD 3 who presented on 03/08/2021 to the hospital with sudden onset of left hemiparesis.  CT scan showed no acute abnormality.  MRI scan showed small right pontine ventral as well as tiny periventricular white matter lacunar infarcts.  MR angiogram showed no large vessel intracranial occlusion except for severe stenosis of right posterior cerebral artery.  Carotid ultrasound showed no significant extracranial stenosis.  2D echo showed ejection fraction of 50 to 55%.  LDL cholesterol was 83 mg percent and hemoglobin A1c was 8.3. On exam he was found to have left facial droop and left tongue deviation and left hemiparesis 3/5 in the upper extremity and 4/5 in the lower extremity.  He was transferred to inpatient rehab and did well and was discharged home in is currently doing outpatient physical occupational therapy.  He is able to ambulate with a cane and walker.  Still has some mild left-sided weakness but it appears to be improving.  He remains on aspirin which is tolerating well without bruising or bleeding and his blood pressure is well controlled.  He is tolerating Lipitor well without muscle aches and pains.  Continues to be on Aricept 10 mg daily for last 12 years for his mild memory loss and Alzheimer's which appears to be stable.  He has no new complaints. ROS:   14 system review of systems is positive for memory loss, hearing loss, weakness gait and balance difficulties and all other systems negative  PMH:  Past  Medical History:  Diagnosis Date   Adenocarcinoma of left lung, stage 1 ([place]) 2001   T1N0 stage I  adenoca left lung resected 01/03/00    Alzheimer's disease ([place])    CAD (coronary artery disease) 2014   a. 09/2012 Cath: LM nl, LAD 50-60p, D1 60-70 m, D1 50ost, LCX nl, RCA min irregs, EF 55-65%.   Generalized anxiety disorder 01/08/2007   Qualifier: Diagnosis of  By: Diona Browner MD, [name]     History of colonic polyps 07/25/2011   Macular degeneration of both eyes 01/21/2014   Nonmelanoma skin cancer 07/25/2011   Multiple lesions excised face/nose   Pericarditis    a. 09/2012 with effusion and tamponade, s/p window.  b.  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 SCC IN SITU TX CX3  SCC (squamous cell carcinoma) 04/12/2005   RIGHT NECK SUP. SCC IN SITU TX EXC   SCC (squamous cell carcinoma) 04/16/2013   LEFT TEMPLE SCC IN SITU TX CX3 5FU   SCC (squamous cell carcinoma) 04/16/2013   BELOW LEFT EYE SCC IN SITU TX CX3 5FU   SCC (squamous cell carcinoma) 04/16/2013   BELOW RIGHT EYE SCC IN SITU TX CX3 5FU   SCC (squamous cell carcinoma) 08/04/2014   LEFT CHEEK SCC IN SITU TX CX3 5FU   SCC (squamous cell carcinoma) 11/27/2018   LEFT TEMPLE SCC IN SITU TX WITH BX   SCC (squamous cell carcinoma)    SCC (squamous cell carcinoma)    SCC (squamous cell carcinoma)    SCC (squamous cell  carcinoma) Well Diff 09/13/2016   Tip of Nose SCC WELL DIFF TX (MOH's), and RIGHT INNER EYE SUP. SCC IN SITU TX TO WATCH   SCC (squamous cell carcinoma) Well Diff 05/30/2017   Right Cheekbone (Cx3,5FU) and Under Left Eye (Cx3,5FU)   Squamous cell carcinoma in situ (SCCIS) 04/12/2005   Right Neck Inf (Cx3), Right Neck Sup (Exc), and Bulb of Nose (Cx3,Exc)   Squamous cell carcinoma in situ (SCCIS) 09/27/2005   Nose (Cx3,Exc)   Squamous cell carcinoma in situ (SCCIS) 02/18/2013   Below Left Eye (tx p bx)   Squamous cell carcinoma in situ (SCCIS) 04/16/2013   Left Temple (Cx3,5FU), Below Left Eye (Cx3,5FU), Below Right Eye (Cx3,5FU)   Squamous cell carcinoma in situ (SCCIS) 08/04/2014   Left Cheek (Cx3,5FU)   Squamous cell carcinoma in situ (SCCIS) 09/13/2016   Right Inner Eye Sup. 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   Type 2 diabetes mellitus with vascular disease ([place]) 05/21/2007   Qualifier: Diagnosis of  By: Diona Browner MD, [name]     Type 2 DM with CKD stage 3 and hypertension ([place]) 07/16/2015   Unspecified essential hypertension     Social History:  Social History   Socioeconomic History   Marital status: Married    Spouse name: Not on file   Number of children: Not on file   Years of education: Not on file   Highest education level: Not on file  Occupational History   Occupation: retired  Tobacco Use   Smoking status: Former    Packs/day: 2.00    Years: 42.00    Pack years: 84.00    Types: Cigarettes    Start date: 02/25/1953    Quit date: 04/04/1984    Years since quitting: 37.1   Smokeless tobacco: Former    Types: Chew    Quit date: 07/27/1986   Tobacco comments:    quit tobacco 26  years ago  Vaping Use   Vaping Use: Never used  Substance and Sexual Activity   Alcohol use: No    Alcohol/week: 0.0 standard drinks   Drug use: No   Sexual activity: Not Currently  Other Topics Concern   Not on file  Social History Narrative   Lives in [place], [place]   Regular exercise--no, mowing grass   Diet: fruit and veggies   Social Determinants of Health   Financial Resource Strain: Low Risk  Difficulty of Paying Living Expenses: Not very hard  Food Insecurity: Not on file  Transportation Needs: Not on file  Physical Activity: Not on file  Stress: Not on file  Social Connections: Not on file  Intimate Partner Violence: Not on file    Medications:   Current Outpatient Medications on File Prior to Visit  Medication Sig Dispense Refill   aspirin EC 81 MG tablet Take 81 mg by mouth daily as needed for moderate pain.     atorvastatin (LIPITOR) 20 MG tablet Take 1 tablet (20 mg total) by mouth daily. 90 tablet 3   Continuous Blood Gluc Receiver (FREESTYLE LIBRE 2 READER) DEVI Use to check blood sugar continous 1 each 0   Continuous Blood Gluc Sensor (FREESTYLE LIBRE 2 SENSOR) MISC Use to check blood sugar continuous 2 each 11   donepezil (ARICEPT) 10 MG tablet TAKE 1 TABLET BY MOUTH EVERYDAY AT BEDTIME 90 tablet 3   ferrous sulfate 325 (65 FE) MG tablet Take 1 tablet (325 mg total) by mouth daily with breakfast. 90 tablet 3   glipiZIDE (GLUCOTROL) 5 MG tablet Take 1 tablet (5 mg total) by mouth 2 (two) times daily before a meal. 60 tablet 3   Multiple Vitamins-Minerals (ICAPS LUTEIN & ZEAXANTHIN PO) Take 1 capsule by mouth in the morning and at bedtime. I - Caps     Multiple Vitamins-Minerals (ICAPS) CAPS Take by mouth.     Omega-3 Fatty Acids (FISH OIL) 1000 MG CAPS Take 1 capsule (1,000 mg total) by mouth in the morning and at bedtime. 30 capsule 0   pantoprazole (PROTONIX) 40 MG tablet Take 1 tablet (40 mg total) by mouth daily. 90 tablet 3   Propylene Glycol (SYSTANE  BALANCE) 0.6 % SOLN Apply 1 drop to eye 2 (two) times daily as needed (dry eyes).     sertraline (ZOLOFT) 100 MG tablet Take 1 tablet (100 mg total) by mouth daily. 90 tablet 3   tamsulosin (FLOMAX) 0.4 MG CAPS capsule Take 1 capsule (0.4 mg total) by mouth daily after supper. 90 capsule 3   vitamin B-12 (CYANOCOBALAMIN) 1000 MCG tablet Take 1 tablet (1,000 mcg total) by mouth daily. 90 tablet 3   No current facility-administered medications on file prior to visit.    Allergies:   Allergies  Allergen Reactions   Sulfa Antibiotics Nausea Only    Physical Exam General: well developed, well nourished pleasant elderly Caucasian male, seated, in no evident distress Head: head normocephalic and atraumatic.  Neck: supple with no carotid or supraclavicular bruits Cardiovascular: regular rate and rhythm, no murmurs Musculoskeletal: no deformity Skin:  no rash/petichiae Vascular:  Normal pulses all extremities Vitals:   05/27/21 0952  BP: 126/71  Pulse: 91   Neurologic Exam Mental Status: Awake and fully alert. Oriented to place and time. Recent and remote memory intact. Attention span, concentration and fund of knowledge appropriate. Mood and affect appropriate.  Diminished recall 1/3.  Able to name 12 animals which can walk on 4 legs.  Clock drawing 3/4.  Mini-Mental exam not done Cranial Nerves: Fundoscopic exam reveals sharp disc margins. Pupils equal, briskly reactive to light. Extraocular movements full without nystagmus. Visual fields full to confrontation. Hearing diminished bilaterally despite a hearing aid.  Facial sensation intact.  Mild left lower facial weakness., tongue, palate moves normally and symmetrically.  Motor: Normal bulk and tone. Normal strength in all tested extremity muscles.  Mild weakness of left grip and intrinsic hand muscles.  Orbits right over left upper  extremity.  Increased tone in the left leg with mild weakness of ankle dorsiflexors and hip flexors. Sensory.:  intact to touch ,pinprick .position and vibratory sensation.  Coordination: Rapid alternating movements normal in all extremities. Finger-to-nose and heel-to-shin performed accurately bilaterally. Gait and Station: Arises from chair without difficulty. Stance is normal. Gait demonstrates normal stride length and balance . Able to heel, toe and tandem walk without difficulty.  Reflexes: 1+ and symmetric. Toes downgoing.   NIHSS  1 Modified Rankin  2  MMSE - Mini Mental State Exam 09/03/2018 08/30/2017 08/15/2016  Not completed: (No Data) (No Data) -  Orientation to time - - 5  Orientation to Place - - 5  Registration - - 3  Attention/ Calculation - - 0  Recall - - 3  Language- name 2 objects - - 0  Language- repeat - - 1  Language- follow 3 step command - - 3  Language- read & follow direction - - 0  Write a sentence - - 0  Copy design - - 0  Total score - - 20      ASSESSMENT: 88 year old Caucasian male with right ventral pontine and tiny left periventricular white matter lacunar infarct in December 2022 from small vessel disease.  Vascular risk factors of hyperlipidemia, diabetes, hypertension.  He also has baseline mild dementia on Aricept recent ER visit on 05/19/2021 for transient worsening of symptoms with MRI showing no new strokes likely due to worsening of compensation.     PLAN:  I had a long d/w patient about his recent stroke, risk for recurrent stroke/TIAs, personally independently reviewed imaging studies and stroke evaluation results and answered questions.Continue aspirin 81 mg daily  for secondary stroke prevention and maintain strict control of hypertension with blood pressure goal below 130/90, diabetes with hemoglobin A1c goal below 6.5% and lipids with LDL cholesterol goal below 70 mg/dL. I also advised the patient to eat a healthy diet with plenty of whole grains, cereals, fruits and vegetables, exercise regularly and maintain ideal body weight .he was advised to use his  walker at all times and we discussed fall safety precautions.  Followup in the future with my nurse practitioner in 6 months or call earlier if necessary. Greater than 50% of time during this 35 minute visit was spent on counseling,explanation of diagnosis, planning of further management, discussion with patient and family and coordination of care Antony Contras, MD Note: This document was prepared with digital dictation and possible smart phrase technology. Any transcriptional errors that result from this process are unintentional

## 2021-05-28 ENCOUNTER — Encounter: Payer: Self-pay | Admitting: Occupational Therapy

## 2021-05-28 ENCOUNTER — Encounter: Payer: Self-pay | Admitting: Physical Therapy

## 2021-05-28 ENCOUNTER — Ambulatory Visit: Payer: Medicare HMO | Admitting: Occupational Therapy

## 2021-05-28 ENCOUNTER — Ambulatory Visit: Payer: Medicare HMO | Admitting: Physical Therapy

## 2021-05-28 ENCOUNTER — Other Ambulatory Visit: Payer: Self-pay

## 2021-05-28 DIAGNOSIS — R278 Other lack of coordination: Secondary | ICD-10-CM | POA: Diagnosis not present

## 2021-05-28 DIAGNOSIS — I69354 Hemiplegia and hemiparesis following cerebral infarction affecting left non-dominant side: Secondary | ICD-10-CM

## 2021-05-28 DIAGNOSIS — R2681 Unsteadiness on feet: Secondary | ICD-10-CM | POA: Diagnosis not present

## 2021-05-28 DIAGNOSIS — R41842 Visuospatial deficit: Secondary | ICD-10-CM | POA: Diagnosis not present

## 2021-05-28 DIAGNOSIS — R41840 Attention and concentration deficit: Secondary | ICD-10-CM

## 2021-05-28 DIAGNOSIS — R262 Difficulty in walking, not elsewhere classified: Secondary | ICD-10-CM | POA: Diagnosis not present

## 2021-05-28 DIAGNOSIS — M6281 Muscle weakness (generalized): Secondary | ICD-10-CM

## 2021-05-28 DIAGNOSIS — R2689 Other abnormalities of gait and mobility: Secondary | ICD-10-CM | POA: Diagnosis not present

## 2021-05-28 NOTE — Therapy (Signed)
[facility] [address] [place], [place], [address] Phone: [phone]   Fax:  [phone]  Physical Therapy Treatment  Patient Details  Name: [name] MRN: [id number] Date of Birth: November 10, 1930 Referring Provider (PT): Cathlyn Parsons, PA-C   Encounter Date: 05/28/2021   PT End of Session - 05/28/21 1234     Visit Number 10    Number of Visits 24    Date for PT Re-Evaluation 07/23/21    Authorization Type [employer]    Progress Note Due on Visit 19   progress note completed on 9th visit   PT Start Time 1232    PT Stop Time 1313    PT Time Calculation (min) 41 min    Equipment Utilized During Treatment Gait belt    Activity Tolerance Patient tolerated treatment well    Behavior During Therapy [facility] for tasks assessed/performed             Past Medical History:  Diagnosis Date   Adenocarcinoma of left lung, stage 1 ([place]) 2001   T1N0 stage I  adenoca left lung resected 01/03/00    Alzheimer's disease ([place])    CAD (coronary artery disease) 2014   a. 09/2012 Cath: LM nl, LAD 50-60p, D1 60-70 m, D1 50ost, LCX nl, RCA min irregs, EF 55-65%.   Generalized anxiety disorder 01/08/2007   Qualifier: Diagnosis of  By: Diona Browner MD, [name]     History of colonic polyps 07/25/2011   Macular degeneration of both eyes 01/21/2014   Nonmelanoma skin cancer 07/25/2011   Multiple lesions excised face/nose   Pericarditis    a. 09/2012 with effusion and tamponade, s/p window.  b.  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 SCC IN SITU TX CX3   SCC (squamous cell carcinoma) 04/12/2005   RIGHT NECK SUP. SCC IN SITU TX EXC   SCC (squamous cell carcinoma) 04/16/2013   LEFT TEMPLE SCC IN SITU TX CX3 5FU   SCC (squamous cell carcinoma) 04/16/2013   BELOW LEFT EYE SCC IN SITU TX CX3 5FU   SCC (squamous cell carcinoma) 04/16/2013   BELOW RIGHT EYE SCC IN SITU TX CX3 5FU   SCC (squamous cell carcinoma) 08/04/2014   LEFT CHEEK SCC IN SITU TX CX3 5FU   SCC (squamous cell carcinoma) 11/27/2018   LEFT TEMPLE SCC IN SITU TX WITH BX   SCC (squamous cell carcinoma)    SCC (squamous cell carcinoma)    SCC (squamous cell carcinoma)    SCC (squamous cell carcinoma) Well Diff 09/13/2016   Tip of Nose SCC WELL DIFF TX (MOH's), and RIGHT INNER EYE SUP. SCC IN SITU TX TO WATCH   SCC (squamous cell carcinoma) Well Diff  05/30/2017   Right Cheekbone (Cx3,5FU) and Under Left Eye (Cx3,5FU)   Squamous cell carcinoma in situ (SCCIS) 04/12/2005   Right Neck Inf (Cx3), Right Neck Sup (Exc), and Bulb of Nose (Cx3,Exc)   Squamous cell carcinoma in situ (SCCIS) 09/27/2005   Nose (Cx3,Exc)   Squamous cell carcinoma in situ (SCCIS) 02/18/2013   Below Left Eye (tx p bx)   Squamous cell carcinoma in situ (SCCIS) 04/16/2013   Left Temple (Cx3,5FU), Below Left Eye (Cx3,5FU), Below Right Eye (Cx3,5FU)   Squamous cell carcinoma in situ (SCCIS) 08/04/2014   Left Cheek (Cx3,5FU)   Squamous cell carcinoma in situ (SCCIS) 09/13/2016   Right Inner Eye Sup. 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 10/25/2006   Right Elbow (Cx3,5FU)   Squamous cell carcinoma of skin 09/27/2005   NOSE SCC IN SITU  TC CX3 5FU   Tobacco abuse, 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/08/2013   Type 2 diabetes mellitus with vascular disease ([place]) 05/21/2007   Qualifier: Diagnosis of  By: Diona Browner MD, [name]     Type 2 DM with CKD stage 3 and hypertension ([place]) 07/16/2015   Unspecified essential hypertension     Past Surgical History:  Procedure Laterality Date   HERNIA REPAIR     LEFT HEART CATHETERIZATION WITH CORONARY ANGIOGRAM N/A 09/13/2012   Procedure: LEFT HEART CATHETERIZATION WITH CORONARY ANGIOGRAM;  Surgeon: Sherren Mocha, MD;  Location: [facility] CATH LAB;  Service: Cardiovascular;  Laterality: N/A;   LOBECTOMY  2001   upper left   PERICARDIAL TAP N/A 09/16/2012   Procedure: PERICARDIAL TAP;  Surgeon: Sinclair Grooms, MD;  Location: [facility] CATH LAB;  Service: Cardiovascular;  Laterality: N/A;   PROSTATECTOMY  2003   SUBXYPHOID PERICARDIAL WINDOW N/A 09/16/2012   Procedure: SUBXYPHOID PERICARDIAL WINDOW;  Surgeon: Rexene Alberts, MD;  Location: [facility];  Service: Thoracic;  Laterality: N/A;   TONSILLECTOMY      There were no vitals filed for this visit.   Subjective Assessment - 05/28/21 1233     Subjective No new complaints. No falls or pain to report. Blood surgar was in 240's this morning, did take his medication.    Pertinent History R pontine CVA 03/07/21. History of Alzheimer's dementia maintained on Aricept type 2 diabetes mellitus hypertension CKD stage III macular degeneration adenocarcinoma left lung with upper lobe resection 2001 prostate cancer with prostatectomy    Limitations Walking;Standing;House hold activities    How long can you sit comfortably? n/a    How long can you stand comfortably? As long as holding on to something    How long can you walk comfortably? Mostly in the house    Patient Stated Goals Pt: walk again without RW. Family: pt amb ind to bathroom (currently requires assist for safety)    Currently in Pain? No/denies    Pain Score 0-No pain                   [facility] Adult PT  Treatment/Exercise - 05/28/21 1235       Transfers   Transfers Sit to Stand;Stand to Sit    Sit to Stand 5: Supervision    Stand to Sit 5: Supervision      Ambulation/Gait   Ambulation/Gait Yes    Ambulation/Gait Assistance 4: Min guard    Ambulation/Gait Assistance Details cues for posture and for equal  step length    Ambulation Distance (Feet) 100 Feet   x1, around gym with session   Assistive device Rolling walker    Gait Pattern Step-through pattern;Decreased dorsiflexion - left;Decreased stance time - left;Lateral trunk lean to right;Poor foot clearance - left    Ambulation Surface Level;Indoor      Exercises   Exercises Other Exercises    Other Exercises  reviewed and modified HEP. Refer to [place] for full details. cues for technique/form. Folder provided with ex's divided into supine on M/W/F and seated on T/TH/F to help with compliance.                     PT Education - 05/28/21 1303     Education Details revised HEP due to new onset weakness.    Person(s) Educated Patient    Methods Explanation;Demonstration;Verbal cues;Handout    Comprehension Verbalized understanding;Returned demonstration;Verbal cues required;Need further instruction              PT Short Term Goals - 05/25/21 1240       PT SHORT TERM GOAL #1   Title Pt will report independence with progressive HEP and report completing >/= 3x/wk with supervision as needed (ALL STGs Due: 06/25/21)    Baseline reports completed 2-3x/weekly and independence; will benefit from progressive HEP    Time 4    Period Weeks    Status Revised    Target Date 06/25/21      PT SHORT TERM GOAL #2   Title Pt will have improved 5x STS to </=15 sec to demo improved balance    Baseline 18.75 secs on 2/21    Time 4    Period Weeks    Status Revised      PT SHORT TERM GOAL #3   Title Pt will be able to ambulate >/= 400 ft with RW without rest break to demo improved endurance and household mobility     Baseline 300 ft, 8/10 fatigue    Time 4    Period Weeks    Status Revised      PT SHORT TERM GOAL #4   Title Pt will have improved Berg Balance Score to at least 42/56    Baseline 36/56; 38/56; 39/56    Time 4    Period Weeks    Status Revised      PT SHORT TERM GOAL #5   Title Pt will demo improved TUG score to </=15 sec for decreased fall risk    Baseline 25.09 sec on eval; 16.59 secs with RW; 18.60 secs    Time 4    Period Weeks    Status Revised    Target Date --               PT Long Term Goals - 05/25/21 1244       PT LONG TERM GOAL #1   Title Pt will be independent with final and progressive HEP (All LTGs Due: 07/23/21)    Baseline HEP established, not completing frequently    Time 8    Period Weeks    Status New    Target Date 07/23/21      PT LONG TERM GOAL #2   Title Pt will be able to amb with LRAD mod I x 800' indoors for improved community mobility and activity tolerance    Baseline 300 ft with RW    Time 8    Period Weeks    Status Revised  PT LONG TERM GOAL #3   Title Pt will demo improved Berg Balance Score of at least 45/56 for decreased fall risk    Baseline 39/56    Time 8    Period Weeks    Status New      PT LONG TERM GOAL #4   Title Pt will improve gait speed to >/= 2.0 ft/sec to demo improved community mobility    Baseline 1.36 ft/sec    Time 8    Period Weeks    Status New      PT LONG TERM GOAL #5   Title Pt will have improved FOTO score of 65    Baseline 49    Time 8    Period Weeks    Status New    Target Date 06/09/21      Additional Long Term Goals   Additional Long Term Goals Yes      PT LONG TERM GOAL #6   Title Pt will improve TUG to </= 12 seconds with LRAD to demo reduced fall risk    Baseline 18.60 secs    Time 8    Period Weeks    Status New      PT LONG TERM GOAL #7   Title Pt will improve 5x sit <> stand to </= 12 seconds to demo improved balance    Baseline 18.75 secs    Time 8    Period Weeks     Status New                   Plan - 05/28/21 1235     Clinical Impression Statement Today's skilled session initially focused on revision of HEP due to increased weakness the past couple weeks. Modified program and issued in booklet for ones to perform on Monday/Wednesdy/Friday, others Tuesday/Thursday/Saturday to increased compliance at home and avoid over fatigue. No issues noted or reported in session. Ended with continued focus on gait with RW. The pt is making steady progress toward goals and should benefit from continued PT to progress toward unmet goals.    Personal Factors and Comorbidities Age;Fitness;Time since onset of injury/illness/exacerbation    Examination-Activity Limitations Locomotion Level;Bathing;Squat;Stairs;Stand;Toileting;Transfers;Bed Mobility;Caring for Others    Examination-Participation Restrictions Meal Prep;Cleaning;Community Activity;Laundry;Shop    Stability/Clinical Decision Making Evolving/Moderate complexity    Rehab Potential Good    PT Frequency 2x / week    PT Duration 8 weeks    PT Treatment/Interventions ADLs/Self Care Home Management;Aquatic Therapy;Electrical Stimulation;Moist Heat;DME Instruction;Gait training;Stair training;Functional mobility training;Therapeutic activities;Therapeutic exercise;Balance training;Neuromuscular re-education;Patient/family education;Orthotic Fit/Training;Manual techniques;Passive range of motion;Dry needling;Taping    PT Next Visit Plan R Continue to work on standing balance, gait training. LLE hip strengthening.    PT Home Exercise Plan Access Code [email or web address]    Consulted and Agree with Plan of Care Patient;Family member/caregiver             Patient will benefit from skilled therapeutic intervention in order to improve the following deficits and impairments:  Abnormal gait, Decreased coordination, Impaired tone, Decreased endurance, Impaired UE functional use, Decreased activity tolerance, Decreased  balance, Improper body mechanics, Decreased mobility, Decreased strength, Postural dysfunction  Visit Diagnosis: Hemiplegia and hemiparesis following cerebral infarction affecting left non-dominant side (HCC)  Other abnormalities of gait and mobility  Unsteadiness on feet  Muscle weakness (generalized)     Problem List Patient Active Problem List   Diagnosis Date Noted   Right pontine cerebrovascular accident ([place]) 03/12/2021   Acute ischemic stroke ([place]) 03/08/2021  Hyperkalemia 03/08/2021   Hypomagnesemia 03/08/2021   GERD (gastroesophageal reflux disease) 03/08/2021   Acute CVA (cerebrovascular accident) ([place]) 03/08/2021   COVID-19 01/01/2021   Sensorineural hearing loss (SNHL) of both ears 05/15/2017   Tinnitus, bilateral 05/15/2017   Cough 06/08/2015   Macular degeneration of both eyes 01/21/2014   Tobacco abuse, 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/08/2013   CAD (coronary artery disease)    Pericardial tamponade 09/17/2012   Acute pericarditis, unspecified 09/13/2012   Adenocarcinoma of left lung, stage 1 ([place]) 07/25/2011   Cancer of prostate w/med recur risk (T2b-c or Gleason 7 or PSA 10-20) ([place]) 07/25/2011   Nonmelanoma skin cancer 07/25/2011   History of colonic polyps 07/25/2011   Type 2 diabetes mellitus with vascular disease ([place]) 05/21/2007   HYPERCHOLESTEROLEMIA 05/21/2007   CARCINOMA, SKIN, SQUAMOUS CELL 01/08/2007   Generalized anxiety disorder 01/08/2007   ALZHEIMER'S DISEASE, MILD 01/08/2007   Essential hypertension 01/08/2007    Willow Ora, PTA, [facility] Outpatient Neuro [facility] [address], [place], [place] [address] [phone] 05/28/21, 4:22 PM   Name: [name] MRN: [id number] Date of Birth: 1930/04/23

## 2021-05-28 NOTE — Therapy (Signed)
[facility] [address] [place], [place], [address] Phone: [phone]   Fax:  [phone]  Occupational Therapy Treatment  Patient Details  Name: [name] MRN: [id number] Date of Birth: 06-15-1930 No data recorded  Encounter Date: 05/28/2021   OT End of Session - 05/28/21 1148     Visit Number 10    Number of Visits 25    Date for OT Re-Evaluation 07/08/21    Authorization Type [employer]    Authorization - Visit Number 10    Progress Note Due on Visit 10    OT Start Time 1140    OT Stop Time 7017    OT Time Calculation (min) 40 min    Activity Tolerance Patient tolerated treatment well    Behavior During Therapy [name] for tasks assessed/performed             Past Medical History:  Diagnosis Date   Adenocarcinoma of left lung, stage 1 ([place]) 2001   T1N0 stage I  adenoca left lung resected 01/03/00    Alzheimer's disease ([place])    CAD (coronary artery disease) 2014   a. 09/2012 Cath: LM nl, LAD 50-60p, D1 60-70 m, D1 50ost, LCX nl, RCA min irregs, EF 55-65%.   Generalized anxiety disorder 01/08/2007   Qualifier: Diagnosis of  By: Diona Browner MD, [name]     History of colonic polyps 07/25/2011   Macular degeneration of both eyes 01/21/2014   Nonmelanoma skin cancer 07/25/2011   Multiple lesions excised face/nose   Pericarditis    a. 09/2012 with effusion and tamponade, s/p window.  b.  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 SCC IN SITU TX CX3   SCC (squamous cell carcinoma) 04/12/2005   RIGHT NECK SUP. SCC IN SITU TX EXC   SCC (squamous cell carcinoma) 04/16/2013   LEFT TEMPLE SCC IN SITU TX CX3 5FU   SCC (squamous cell carcinoma) 04/16/2013   BELOW LEFT EYE SCC IN SITU TX CX3 5FU   SCC (squamous cell carcinoma) 04/16/2013   BELOW RIGHT EYE SCC IN SITU TX CX3 5FU   SCC (squamous cell carcinoma) 08/04/2014   LEFT CHEEK SCC IN SITU TX CX3 5FU   SCC (squamous cell carcinoma) 11/27/2018   LEFT TEMPLE SCC IN SITU TX WITH BX   SCC (squamous cell carcinoma)    SCC (squamous cell carcinoma)    SCC (squamous cell carcinoma)    SCC (squamous cell carcinoma) Well Diff 09/13/2016   Tip of Nose SCC WELL DIFF TX (MOH's), and RIGHT INNER EYE SUP. SCC IN SITU TX TO WATCH   SCC (squamous cell carcinoma) Well Diff 05/30/2017   Right Cheekbone (Cx3,5FU) and Under Left Eye (Cx3,5FU)  Squamous cell carcinoma in situ (SCCIS) 04/12/2005   Right Neck Inf (Cx3), Right Neck Sup (Exc), and Bulb of Nose (Cx3,Exc)   Squamous cell carcinoma in situ (SCCIS) 09/27/2005   Nose (Cx3,Exc)   Squamous cell carcinoma in situ (SCCIS) 02/18/2013   Below Left Eye (tx p bx)   Squamous cell carcinoma in situ (SCCIS) 04/16/2013   Left Temple (Cx3,5FU), Below Left Eye (Cx3,5FU), Below Right Eye (Cx3,5FU)   Squamous cell carcinoma in situ (SCCIS) 08/04/2014   Left Cheek (Cx3,5FU)   Squamous cell carcinoma in situ (SCCIS) 09/13/2016   Right Inner Eye Sup. 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   Type 2 diabetes mellitus  with vascular disease ([place]) 05/21/2007   Qualifier: Diagnosis of  By: Diona Browner MD, [name]     Type 2 DM with CKD stage 3 and hypertension ([place]) 07/16/2015   Unspecified essential hypertension     Past Surgical History:  Procedure Laterality Date   HERNIA REPAIR     LEFT HEART CATHETERIZATION WITH CORONARY ANGIOGRAM N/A 09/13/2012   Procedure: LEFT HEART CATHETERIZATION WITH CORONARY ANGIOGRAM;  Surgeon: Sherren Mocha, MD;  Location: [facility] CATH LAB;  Service: Cardiovascular;  Laterality: N/A;   LOBECTOMY  2001   upper left   PERICARDIAL TAP N/A 09/16/2012   Procedure: PERICARDIAL TAP;  Surgeon: Sinclair Grooms, MD;  Location[facility] CATH LAB;  Service: Cardiovascular;  Laterality: N/A;   PROSTATECTOMY  2003   SUBXYPHOID PERICARDIAL WINDOW N/A 09/16/2012   Procedure: SUBXYPHOID PERICARDIAL WINDOW;  Surgeon: Rexene Alberts, MD;  [facility];  Service: Thoracic;  Laterality: N/A;   TONSILLECTOMY      There were no vitals filed for this visit.   Subjective Assessment - 05/28/21 1148     Subjective  no pain    Pertinent History Pt is an 88 year old man admitted on 03/07/21 with L side weakness. MRI + for small foci ischemia R pons and L frontal periventricular white matter. PMH. Alzheimers dementia, DM, HTN, stage 3 CKD, lung ca, prostate ca, multiple skin cancers, CAD, anxiety, macular degeneration. Pt lives with wife who has hired caregiver, children currently assisting    Limitations HOH, macular degeneration    Special Tests Goes by [name].    Patient Stated Goals get left  arm working, increase independence    Currently in Pain? No/denies                         Treatment:Supine Closed chain shoulder flexion, then triceps extension and shoulder horizontal abduction with PVC pipe frame, min v.c and facilitation Seated functional midrange reach with LUE to place and remove yellow and red clothespins from vertical antennae, min difficulty, v.c Fine motor coordination task to place  and remove large and medium pegs from pegboard, pt used RUE to locate hole with fingertips after therapist recommendation due to macular degeneration, min difficulty/ v.c Flipping and dealing cards with thumb playing cards with LUE for increased fine motor coordination, min difficulty/ v.c             OT Short Term Goals - 05/25/21 1241  OT SHORT TERM GOAL #1   Title I with inital HEP.    Time 4    Period Weeks    Status Achieved    Target Date 05/13/21      OT SHORT TERM GOAL #2   Title Pt will donn shirt with set up and supervision and pants with min A.    Time 4    Period Weeks    Status Achieved      OT SHORT TERM GOAL #3   Title Pt will demonstrate 90* shoulder flexion in prep for functional reach with LUE with no more than min compensations.    Time 4    Period Weeks    Status On-going   58* with mod compensation     OT SHORT TERM GOAL #4   Title Pt will verbalize understanding of memory compensation strategies.    Time 4    Period Weeks    Status On-going   issued may benefit from reinforcement     OT SHORT TERM GOAL #5   Title Pt will demonstrate improved LUE functional use as evidenced by increasing box/ blocks score by 5 blocks    Baseline R 43 blocks, LUE 28 blocks    Time 4    Period Weeks    Status Achieved   34  blocks 05/25/21     OT SHORT TERM GOAL #6   Title Pt will verbalize understanding of adapted strategies to maximize safety and I with ADLs/ IADLs .    (ZW:CHENIDP food)    Time 4    Period Weeks    Status On-going   Pt practiced cutting food, min difficulty keeping LUE on fork, min v.c              OT Long Term Goals - 04/21/21 1611       OT LONG TERM GOAL #1   Title Pt will perform all basic ADLS with supervision.    Time 12    Period Weeks    Status On-going    Target Date 07/08/21      OT LONG TERM GOAL #2   Title Pt will demonstrate ability to retrieve a lightweight object at 100* with LUE    Time 12    Period  Weeks    Status On-going      OT LONG TERM GOAL #3   Title Pt will demonstrate improved fine motor coordination for ADLs  as evidenced by placing 9 pegs in 90 secs or less for 9 hole peg test.    Time 12    Period Weeks    Status On-going      OT LONG TERM GOAL #4   Title Pt will increase LUE grip strength by 5 lbs for increased LUE functional use.    Baseline RUE 65.9 LUE 33.7    Time 12    Period Weeks    Status On-going      OT LONG TERM GOAL #5   Title Pt will perform simple beverage prep with supervision.    Time 12    Period Weeks    Status On-going                   Plan - 05/28/21 1205     Clinical Impression Statement Pt demonstrates excellent overall progress with improving LUE functional use.    OT Occupational Profile and History Detailed Assessment- Review of Records and additional review of physical, cognitive, psychosocial history related to current  functional performance    Occupational performance deficits (Please refer to evaluation for details): ADL's;IADL's;Rest and Sleep;Leisure;Social Participation    Body Structure / Function / Physical Skills ADL;UE functional use;Endurance;Balance;Flexibility;Pain;Vision;FMC;Gait;ROM;GMC;Coordination;Decreased knowledge of precautions;Decreased knowledge of use of DME;IADL;Strength;Dexterity;Mobility    Rehab Potential Good    Clinical Decision Making Several treatment options, min-mod task modification necessary   visual and cognitive deficits, decrease coordiantion   Comorbidities Affecting Occupational Performance: May have comorbidities impacting occupational performance   Alzheimer's, macular degeneration, CVA   Modification or Assistance to Complete Evaluation  Min-Moderate modification of tasks or assist with assess necessary to complete eval   visual deficits, extra cueing and  assist required   OT Frequency 2x / week    OT Duration 12 weeks    OT Treatment/Interventions Self-care/ADL  training;Ultrasound;Visual/perceptual remediation/compensation;Patient/family education;DME and/or AE instruction;Aquatic Therapy;Paraffin;Passive range of motion;Balance training;Gait Training;Stair Training;Fluidtherapy;Cryotherapy;Electrical Stimulation;Therapist, nutritional;Therapeutic activities;Manual Therapy;Therapeutic exercise;Splinting;Moist Heat;Neuromuscular education;Cognitive remediation/compensation    Plan work towards unmet goals, NMR, functional reach,    Consulted and Agree with Plan of Care Patient             Patient will benefit from skilled therapeutic intervention in order to improve the following deficits and impairments:   Body Structure / Function / Physical Skills: ADL, UE functional use, Endurance, Balance, Flexibility, Pain, Vision, FMC, Gait, ROM, GMC, Coordination, Decreased knowledge of precautions, Decreased knowledge of use of DME, IADL, Strength, Dexterity, Mobility       Visit Diagnosis: Hemiplegia and hemiparesis following cerebral infarction affecting left non-dominant side (HCC)  Other lack of coordination  Attention and concentration deficit  Visuospatial deficit  Muscle weakness (generalized)  Unsteadiness on feet    Problem List Patient Active Problem List   Diagnosis Date Noted   Right pontine cerebrovascular accident ([place]) 03/12/2021   Acute ischemic stroke ([place]) 03/08/2021   Hyperkalemia 03/08/2021   Hypomagnesemia 03/08/2021   GERD (gastroesophageal reflux disease) 03/08/2021   Acute CVA (cerebrovascular accident) ([place]) 03/08/2021   COVID-19 01/01/2021   Sensorineural hearing loss (SNHL) of both ears 05/15/2017   Tinnitus, bilateral 05/15/2017   Cough 06/08/2015   Macular degeneration of both eyes 01/21/2014   Tobacco abuse, 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/08/2013   CAD (coronary artery disease)    Pericardial tamponade 09/17/2012   Acute pericarditis, unspecified 09/13/2012   Adenocarcinoma of left lung, stage 1 ([place]) 07/25/2011    Cancer of prostate w/med recur risk (T2b-c or Gleason 7 or PSA 10-20) ([place]) 07/25/2011   Nonmelanoma skin cancer 07/25/2011   History of colonic polyps 07/25/2011   Type 2 diabetes mellitus with vascular disease ([place]) 05/21/2007   HYPERCHOLESTEROLEMIA 05/21/2007   CARCINOMA, SKIN, SQUAMOUS CELL 01/08/2007   Generalized anxiety disorder 01/08/2007   ALZHEIMER'S DISEASE, MILD 01/08/2007   Essential hypertension 01/08/2007    Anari Evitt, OT 05/28/2021, 12:07 PM  [facility] [address] [place], [place], [address] Phone: [phone]   Fax:  [phone]  Name: [name] MRN: [id number] Date of Birth: 1930/06/19

## 2021-05-28 NOTE — Patient Instructions (Signed)
Access Code: [email or web address] URL: [email or web address] Date: 05/28/2021 Prepared by: Willow Ora  Exercises Supine March - 1 x daily - 5 x weekly - 1 sets - 10 reps Hooklying Clamshell with Resistance - 1 x daily - 5 x weekly - 1 sets - 10 reps Straight Leg Raise with Resistance - 1 x daily - 5 x weekly - 1 sets - 10 reps Supine Heel Bridge with Arms Across Chest - 1 x daily - 5 x weekly - 1 sets - 10 reps Staggered Sit-to-Stand - 1 x daily - 5 x weekly - 2 sets - 10 reps Seated Knee Extension with Resistance - 1 x daily - 5 x weekly - 1 sets - 10 reps Seated Hamstring Curl with Anchored Resistance - 1 x daily - 5 x weekly - 1 sets - 10 reps

## 2021-06-01 ENCOUNTER — Other Ambulatory Visit: Payer: Self-pay

## 2021-06-01 ENCOUNTER — Encounter: Payer: Self-pay | Admitting: Occupational Therapy

## 2021-06-01 ENCOUNTER — Ambulatory Visit: Payer: Medicare HMO | Admitting: Occupational Therapy

## 2021-06-01 ENCOUNTER — Ambulatory Visit: Payer: Medicare HMO

## 2021-06-01 VITALS — BP 161/76 | HR 77

## 2021-06-01 DIAGNOSIS — R41840 Attention and concentration deficit: Secondary | ICD-10-CM

## 2021-06-01 DIAGNOSIS — R2681 Unsteadiness on feet: Secondary | ICD-10-CM

## 2021-06-01 DIAGNOSIS — I69354 Hemiplegia and hemiparesis following cerebral infarction affecting left non-dominant side: Secondary | ICD-10-CM

## 2021-06-01 DIAGNOSIS — R262 Difficulty in walking, not elsewhere classified: Secondary | ICD-10-CM | POA: Diagnosis not present

## 2021-06-01 DIAGNOSIS — R2689 Other abnormalities of gait and mobility: Secondary | ICD-10-CM

## 2021-06-01 DIAGNOSIS — R41842 Visuospatial deficit: Secondary | ICD-10-CM

## 2021-06-01 DIAGNOSIS — R278 Other lack of coordination: Secondary | ICD-10-CM | POA: Diagnosis not present

## 2021-06-01 DIAGNOSIS — M6281 Muscle weakness (generalized): Secondary | ICD-10-CM | POA: Diagnosis not present

## 2021-06-01 NOTE — Therapy (Signed)
[facility] [address] [place], [place], [address] Phone: [phone]   Fax:  [phone]  Physical Therapy Treatment  Patient Details  Name: [name] MRN: [id number] Date of Birth: 1930/07/09 Referring Provider (PT): Cathlyn Parsons, PA-C   Encounter Date: 06/01/2021   PT End of Session - 06/01/21 1136     Visit Number 11    Number of Visits 24    Date for PT Re-Evaluation 07/23/21    Authorization Type [employer]    Progress Note Due on Visit 19   progress note completed on 9th visit   PT Start Time 1140    PT Stop Time 1225    PT Time Calculation (min) 45 min    Equipment Utilized During Treatment Gait belt    Activity Tolerance Patient tolerated treatment well    Behavior During Therapy WFL for tasks assessed/performed             Past Medical History:  Diagnosis Date   Adenocarcinoma of left lung, stage 1 ([place]) 2001   T1N0 stage I  adenoca left lung resected 01/03/00    Alzheimer's disease ([place])    CAD (coronary artery disease) 2014   a. 09/2012 Cath: LM nl, LAD 50-60p, D1 60-70 m, D1 50ost, LCX nl, RCA min irregs, EF 55-65%.   Generalized anxiety disorder 01/08/2007   Qualifier: Diagnosis of  By: Diona Browner MD, [name]     History of colonic polyps 07/25/2011   Macular degeneration of both eyes 01/21/2014   Nonmelanoma skin cancer 07/25/2011   Multiple lesions excised face/nose   Pericarditis    a. 09/2012 with effusion and tamponade, s/p window.  b.  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 SCC IN SITU TX CX3   SCC (squamous cell carcinoma) 04/12/2005   RIGHT NECK SUP. SCC IN SITU TX EXC   SCC (squamous cell carcinoma) 04/16/2013   LEFT TEMPLE SCC IN SITU TX CX3 5FU   SCC (squamous cell carcinoma) 04/16/2013   BELOW LEFT EYE SCC IN SITU TX CX3 5FU   SCC (squamous cell carcinoma) 04/16/2013   BELOW RIGHT EYE SCC IN SITU TX CX3 5FU   SCC (squamous cell carcinoma) 08/04/2014   LEFT CHEEK SCC IN SITU TX CX3 5FU   SCC (squamous cell carcinoma) 11/27/2018   LEFT TEMPLE SCC IN SITU TX WITH BX   SCC (squamous cell carcinoma)    SCC (squamous cell carcinoma)    SCC (squamous cell carcinoma)    SCC (squamous cell carcinoma) Well Diff 09/13/2016   Tip of Nose SCC WELL DIFF TX (MOH's), and RIGHT INNER EYE SUP. SCC IN SITU TX TO WATCH   SCC (squamous cell carcinoma) Well Diff  05/30/2017   Right Cheekbone (Cx3,5FU) and Under Left Eye (Cx3,5FU)   Squamous cell carcinoma in situ (SCCIS) 04/12/2005   Right Neck Inf (Cx3), Right Neck Sup (Exc), and Bulb of Nose (Cx3,Exc)   Squamous cell carcinoma in situ (SCCIS) 09/27/2005   Nose (Cx3,Exc)   Squamous cell carcinoma in situ (SCCIS) 02/18/2013   Below Left Eye (tx p bx)   Squamous cell carcinoma in situ (SCCIS) 04/16/2013   Left Temple (Cx3,5FU), Below Left Eye (Cx3,5FU), Below Right Eye (Cx3,5FU)   Squamous cell carcinoma in situ (SCCIS) 08/04/2014   Left Cheek (Cx3,5FU)   Squamous cell carcinoma in situ (SCCIS) 09/13/2016   Right Inner Eye Sup. 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 10/25/2006   Right Elbow (Cx3,5FU)   Squamous cell carcinoma of skin 09/27/2005   NOSE SCC IN SITU  TC CX3 5FU   Tobacco abuse, 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/08/2013   Type 2 diabetes mellitus with vascular disease ([place]) 05/21/2007   Qualifier: Diagnosis of  By: Diona Browner MD, [name]     Type 2 DM with CKD stage 3 and hypertension ([place]) 07/16/2015   Unspecified essential hypertension     Past Surgical History:  Procedure Laterality Date   HERNIA REPAIR     LEFT HEART CATHETERIZATION WITH CORONARY ANGIOGRAM N/A 09/13/2012   Procedure: LEFT HEART CATHETERIZATION WITH CORONARY ANGIOGRAM;  Surgeon: Sherren Mocha, MD;  Location[facility] CATH LAB;  Service: Cardiovascular;  Laterality: N/A;   LOBECTOMY  2001   upper left   PERICARDIAL TAP N/A 09/16/2012   Procedure: PERICARDIAL TAP;  Surgeon: Sinclair Grooms, MD;  Location: [facility] CATH LAB;  Service: Cardiovascular;  Laterality: N/A;   PROSTATECTOMY  2003   SUBXYPHOID PERICARDIAL WINDOW N/A 09/16/2012   Procedure: SUBXYPHOID PERICARDIAL WINDOW;  Surgeon: Rexene Alberts, MD;  Location: [facility];  Service: Thoracic;  Laterality: N/A;   TONSILLECTOMY      Vitals:   06/01/21 1146  BP: (!) 161/76  Pulse: 77     Subjective Assessment - 06/01/21 1144     Subjective No new change. Reports he did have a nose bleed over the weekend that was bad, but was able to get it to stop. Reports BP has been doing okay. Blood Sugar currently is 217.    Pertinent History R pontine CVA 03/07/21. History of Alzheimer's dementia maintained on Aricept type 2 diabetes mellitus hypertension CKD stage III macular degeneration adenocarcinoma left lung with upper lobe resection 2001 prostate cancer with prostatectomy    Limitations Walking;Standing;House hold activities    How long can you sit comfortably? n/a    How long can you stand comfortably? As long as holding on to something    How long can you walk comfortably? Mostly in the house    Patient Stated Goals Pt: walk again without RW. Family: pt amb ind to bathroom (currently requires assist for safety)    Currently in Pain? No/denies                  [facility] Adult PT Treatment/Exercise - 06/01/21 0001       Transfers   Transfers Sit to Stand;Stand to Sit    Sit to Stand 5: Supervision    Stand to Sit 5: Supervision    Number of Reps 2 sets;Other reps (comment)    Comments completed x 8 reps  with UE support with staggered stance LLE posterior, then trialed second set with RLE positioned on 2" block to promtoe weight shift onto LLE and no UE support completed x 8 reps. CGA required.      Ambulation/Gait   Ambulation/Gait Yes    Ambulation/Gait Assistance 4: Min guard;5: Supervision    Ambulation/Gait Assistance Details throughout session with activities, continued cues for upright posture and AD placement to poromote improved posture.    Ambulation Distance (Feet) --   clinic distances   Assistive device Rolling walker    Gait Pattern Step-through pattern;Decreased dorsiflexion - left;Decreased stance time - left;Lateral trunk lean to right;Poor foot clearance - left    Ambulation Surface Level;Indoor      Neuro Re-ed    Neuro Re-ed Details  Standing in // bars, with reducing from BUE support > single UE > no UE support, completed steps forward with RLE to target x 15 reps, cues to stand tall through LLE. CGA. PT faciliation for weight shift. Then completed x 15 reps with RLE forward to target, working on improved hip knee/flexion forward and hip extension with posterior step. HR: 89-90, Sp02: 99%      Exercises   Exercises Knee/Hip      Knee/Hip Exercises: Aerobic   Other Aerobic Completed interval training on the SciFit with BUE/BLE on Level 2.0. Completed 2 minutes on, followed by 1 minute rest for 3 intervals. Mild SOB noted. HR assessed after each interval, HR: ranging from HR: 82-90, Sp02: 96-97%. Patient overall tolerating interval well today.      Knee/Hip Exercises: Standing   Forward Step Up Both;1 set;10 reps;Hand Hold: 2;Hand Hold: 1;Step Height: 4";Limitations    Forward Step Up Limitations with 4" step in  // bars progressing from BUE support to single UE support, completed x 10 reps bilat, Cues for upright posture upon step up.                 Balance Exercises - 06/01/21 0001       Balance Exercises: Standing   Marching Solid surface;Upper extremity assist 1;Static;Limitations    Marching Limitations standing on firm surface, completed alternating marching 2 x 8 reps bilat, with standing rest break required between sets due to [place]. Cues to stand tall through LLE when stance on this lower extremity. PT faicliating at pelvis.                  PT Short Term Goals - 05/25/21 1240       PT SHORT TERM GOAL #1   Title Pt will report independence with progressive HEP and report completing >/= 3x/wk with supervision as needed (ALL STGs Due: 06/25/21)    Baseline reports completed 2-3x/weekly and independence; will benefit from progressive HEP    Time 4    Period Weeks    Status Revised    Target Date 06/25/21      PT SHORT TERM GOAL #2   Title Pt will have improved 5x STS to </=15 sec to demo improved balance    Baseline 18.75 secs on 2/21    Time 4    Period Weeks    Status Revised      PT SHORT TERM GOAL #3   Title Pt will be able to ambulate >/= 400 ft with RW without rest break to demo improved endurance and household mobility    Baseline 300 ft, 8/10 fatigue    Time 4    Period Weeks    Status Revised  PT SHORT TERM GOAL #4   Title Pt will have improved Berg Balance Score to at least 42/56    Baseline 36/56; 38/56; 39/56    Time 4    Period Weeks    Status Revised      PT SHORT TERM GOAL #5   Title Pt will demo improved TUG score to </=15 sec for decreased fall risk    Baseline 25.09 sec on eval; 16.59 secs with RW; 18.60 secs    Time 4    Period Weeks    Status Revised    Target Date --               PT Long Term Goals - 05/25/21 1244       PT LONG TERM GOAL #1   Title Pt will be independent with final and progressive HEP (All LTGs  Due: 07/23/21)    Baseline HEP established, not completing frequently    Time 8    Period Weeks    Status New    Target Date 07/23/21      PT LONG TERM GOAL #2   Title Pt will be able to amb with LRAD mod I x 800' indoors for improved community mobility and activity tolerance    Baseline 300 ft with RW    Time 8    Period Weeks    Status Revised      PT LONG TERM GOAL #3   Title Pt will demo improved Berg Balance Score of at least 45/56 for decreased fall risk    Baseline 39/56    Time 8    Period Weeks    Status New      PT LONG TERM GOAL #4   Title Pt will improve gait speed to >/= 2.0 ft/sec to demo improved community mobility    Baseline 1.36 ft/sec    Time 8    Period Weeks    Status New      PT LONG TERM GOAL #5   Title Pt will have improved FOTO score of 65    Baseline 49    Time 8    Period Weeks    Status New    Target Date 06/09/21      Additional Long Term Goals   Additional Long Term Goals Yes      PT LONG TERM GOAL #6   Title Pt will improve TUG to </= 12 seconds with LRAD to demo reduced fall risk    Baseline 18.60 secs    Time 8    Period Weeks    Status New      PT LONG TERM GOAL #7   Title Pt will improve 5x sit <> stand to </= 12 seconds to demo improved balance    Baseline 18.75 secs    Time 8    Period Weeks    Status New                   Plan - 06/01/21 1229     Clinical Impression Statement Continued activities focused on BLE strengthening and activities promoting improved stance on LLE. Continue to demo increased stability in LLE (espedically noted at L Hip). Patient toelrating well with intermittent rest breaks. Will continue to progress toward all LTGs.    Personal Factors and Comorbidities Age;Fitness;Time since onset of injury/illness/exacerbation    Examination-Activity Limitations Locomotion Level;Bathing;Squat;Stairs;Stand;Toileting;Transfers;Bed Mobility;Caring for Others    Examination-Participation Restrictions Meal  Prep;Cleaning;Community Activity;Laundry;Shop    Stability/Clinical Decision Making Evolving/Moderate  complexity    Rehab Potential Good    PT Frequency 2x / week    PT Duration 8 weeks    PT Treatment/Interventions ADLs/Self Care Home Management;Aquatic Therapy;Electrical Stimulation;Moist Heat;DME Instruction;Gait training;Stair training;Functional mobility training;Therapeutic activities;Therapeutic exercise;Balance training;Neuromuscular re-education;Patient/family education;Orthotic Fit/Training;Manual techniques;Passive range of motion;Dry needling;Taping    PT Next Visit Plan Continue to work on standing balance, gait training. LLE hip strengthening.    PT Home Exercise Plan Access Code [email or web address]    Consulted and Agree with Plan of Care Patient;Family member/caregiver             Patient will benefit from skilled therapeutic intervention in order to improve the following deficits and impairments:  Abnormal gait, Decreased coordination, Impaired tone, Decreased endurance, Impaired UE functional use, Decreased activity tolerance, Decreased balance, Improper body mechanics, Decreased mobility, Decreased strength, Postural dysfunction  Visit Diagnosis: Hemiplegia and hemiparesis following cerebral infarction affecting left non-dominant side (HCC)  Other abnormalities of gait and mobility  Unsteadiness on feet  Difficulty in walking, not elsewhere classified     Problem List Patient Active Problem List   Diagnosis Date Noted   Right pontine cerebrovascular accident ([place]) 03/12/2021   Acute ischemic stroke ([place]) 03/08/2021   Hyperkalemia 03/08/2021   Hypomagnesemia 03/08/2021   GERD (gastroesophageal reflux disease) 03/08/2021   Acute CVA (cerebrovascular accident) ([place]) 03/08/2021   COVID-19 01/01/2021   Sensorineural hearing loss (SNHL) of both ears 05/15/2017   Tinnitus, bilateral 05/15/2017   Cough 06/08/2015   Macular degeneration of both eyes 01/21/2014   Tobacco  abuse, 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/08/2013   CAD (coronary artery disease)    Pericardial tamponade 09/17/2012   Acute pericarditis, unspecified 09/13/2012   Adenocarcinoma of left lung, stage 1 ([place]) 07/25/2011   Cancer of prostate w/med recur risk (T2b-c or Gleason 7 or PSA 10-20) ([place]) 07/25/2011   Nonmelanoma skin cancer 07/25/2011   History of colonic polyps 07/25/2011   Type 2 diabetes mellitus with vascular disease ([place]) 05/21/2007   HYPERCHOLESTEROLEMIA 05/21/2007   CARCINOMA, SKIN, SQUAMOUS CELL 01/08/2007   Generalized anxiety disorder 01/08/2007   ALZHEIMER'S DISEASE, MILD 01/08/2007   Essential hypertension 01/08/2007    Jones Bales, PT, DPT 06/01/2021, 12:37 PM  [facility] [address] [place], [place], [address] Phone: [phone]   Fax:  [phone]  Name: FREDRIK MOGEL MRN: [id number] Date of Birth: 18-Dec-1930

## 2021-06-01 NOTE — Therapy (Signed)
[facility] [address] [place], [place], [address] Phone: [phone]   Fax:  [phone]  Occupational Therapy Treatment  Patient Details  Name: [name] MRN: [id number] Date of Birth: 03-23-1931 No data recorded  Encounter Date: 06/01/2021   OT End of Session - 06/01/21 1443     Visit Number 11    Number of Visits 25    Date for OT Re-Evaluation 07/08/21    Authorization Type [employer]    Progress Note Due on Visit 10             Past Medical History:  Diagnosis Date   Adenocarcinoma of left lung, stage 1 ([place]) 2001   T1N0 stage I  adenoca left lung resected 01/03/00    Alzheimer's disease ([place])    CAD (coronary artery disease) 2014   a. 09/2012 Cath: LM nl, LAD 50-60p, D1 60-70 m, D1 50ost, LCX nl, RCA min irregs, EF 55-65%.   Generalized anxiety disorder 01/08/2007   Qualifier: Diagnosis of  By: Diona Browner MD, [name]     History of colonic polyps 07/25/2011   Macular degeneration of both eyes 01/21/2014   Nonmelanoma skin cancer 07/25/2011   Multiple lesions excised face/nose   Pericarditis    a. 09/2012 with effusion and tamponade, s/p window.  b.  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) 04/12/2005   BULB OF NOSE SCC IN SITU TX CX3 5FU, EXC   SCC (squamous cell carcinoma) 02/18/2013   BELOW LEFT EYE SCC IN SITU TX WITH BX   SCC (squamous cell carcinoma) 08/27/2008   RIGHT OUTER BROW FOCAL IN SITU TX WITH BX   SCC (squamous cell carcinoma) 08/27/2008   BELOW LEFT EYE FOCAL IN SITU TX CX3 5FU   SCC (squamous cell carcinoma) 10/25/2006   RIGHT ELBOW SCC IN SITU TX WITH BX CX3 5FU   SCC (squamous cell carcinoma) 04/12/2005   RIGHT NECK INF. SCC IN SITU TX CX3   SCC (squamous cell carcinoma) 04/12/2005   RIGHT NECK SUP. SCC IN SITU TX EXC    SCC (squamous cell carcinoma) 04/16/2013   LEFT TEMPLE SCC IN SITU TX CX3 5FU   SCC (squamous cell carcinoma) 04/16/2013   BELOW LEFT EYE SCC IN SITU TX CX3 5FU   SCC (squamous cell carcinoma) 04/16/2013   BELOW RIGHT EYE SCC IN SITU TX CX3 5FU   SCC (squamous cell carcinoma) 08/04/2014   LEFT CHEEK SCC IN SITU TX CX3 5FU   SCC (squamous cell carcinoma) 11/27/2018   LEFT TEMPLE SCC IN SITU TX WITH BX   SCC (squamous cell carcinoma)    SCC (squamous cell carcinoma)    SCC (squamous cell carcinoma)    SCC (squamous cell carcinoma) Well Diff 09/13/2016   Tip of Nose SCC WELL DIFF TX (MOH's), and RIGHT INNER EYE SUP. SCC IN SITU TX TO WATCH   SCC (squamous cell carcinoma) Well Diff 05/30/2017   Right Cheekbone (Cx3,5FU) and Under Left Eye (Cx3,5FU)   Squamous cell carcinoma in situ (SCCIS) 04/12/2005   Right Neck Inf (Cx3), Right Neck Sup (Exc), and Bulb of Nose (Cx3,Exc)   Squamous cell carcinoma in situ (SCCIS) 09/27/2005   Nose (Cx3,Exc)   Squamous cell carcinoma in situ (SCCIS) 02/18/2013   Below Left Eye (tx  p bx)   Squamous cell carcinoma in situ (SCCIS) 04/16/2013   Left [place] (Cx3,5FU), Below Left Eye (Cx3,5FU), Below Right Eye (Cx3,5FU)   Squamous cell carcinoma in situ (SCCIS) 08/04/2014   Left Cheek (Cx3,5FU)   Squamous cell carcinoma in situ (SCCIS) 09/13/2016   Right Inner Eye Sup. 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   Type 2 diabetes mellitus with vascular disease ([place]) 05/21/2007   Qualifier: Diagnosis of  By: Diona Browner MD, [name]     Type 2 DM with CKD stage 3 and hypertension ([place]) 07/16/2015   Unspecified essential hypertension     Past Surgical History:  Procedure  Laterality Date   HERNIA REPAIR     LEFT HEART CATHETERIZATION WITH CORONARY ANGIOGRAM N/A 09/13/2012   Procedure: LEFT HEART CATHETERIZATION WITH CORONARY ANGIOGRAM;  Surgeon: Sherren Mocha, MD;  Location: [facility] CATH LAB;  Service: Cardiovascular;  Laterality: N/A;   LOBECTOMY  2001   upper left   PERICARDIAL TAP N/A 09/16/2012   Procedure: PERICARDIAL TAP;  Surgeon: Sinclair Grooms, MD;  Location: [facility] CATH LAB;  Service: Cardiovascular;  Laterality: N/A;   PROSTATECTOMY  2003   SUBXYPHOID PERICARDIAL WINDOW N/A 09/16/2012   Procedure: SUBXYPHOID PERICARDIAL WINDOW;  Surgeon: Rexene Alberts, MD;  Location: [facility];  Service: Thoracic;  Laterality: N/A;   TONSILLECTOMY      There were no vitals filed for this visit.   Subjective Assessment - 06/01/21 1237     Subjective  no pain    Pertinent History Pt is an 88 year old man admitted on 03/07/21 with L side weakness. MRI + for small foci ischemia R pons and L frontal periventricular white matter. PMH. Alzheimers dementia, DM, HTN, stage 3 CKD, lung ca, prostate ca, multiple skin cancers, CAD, anxiety, macular degeneration. Pt lives with wife who has hired caregiver, children currently assisting    Limitations HOH, macular degeneration    Patient Stated Goals get left  arm working, increase independence    Currently in Pain? No/denies                    Treatment:Supine Closed chain shoulder flexion, shoulder horizontal abduction with PVC pipe frame, min v.c and facilitation Seated functional midrange shoulder flexion with PVC pipe frame, min v.c and facilitation Flipping and dealing cards with thumb with LUE for increased fine motor coordination, min difficulty/ v.c  Arm bike x 5 mins level 1 for conditioning. Functional reaching low/mid range to place checkers in connect 4 frame, min v.c and facilitation.                 OT Short Term Goals - 05/25/21 1241       OT SHORT TERM GOAL #1   Title I with inital HEP.     Time 4    Period Weeks    Status Achieved    Target Date 05/13/21      OT SHORT TERM GOAL #2   Title Pt will donn shirt with set up and supervision and pants with min A.    Time 4    Period Weeks    Status Achieved  OT SHORT TERM GOAL #3   Title Pt will demonstrate 90* shoulder flexion in prep for functional reach with LUE with no more than min compensations.    Time 4    Period Weeks    Status On-going   6* with mod compensation     OT SHORT TERM GOAL #4   Title Pt will verbalize understanding of memory compensation strategies.    Time 4    Period Weeks    Status On-going   issued may benefit from reinforcement     OT SHORT TERM GOAL #5   Title Pt will demonstrate improved LUE functional use as evidenced by increasing box/ blocks score by 5 blocks    Baseline R 43 blocks, LUE 28 blocks    Time 4    Period Weeks    Status Achieved   34  blocks 05/25/21     OT SHORT TERM GOAL #6   Title Pt will verbalize understanding of adapted strategies to maximize safety and I with ADLs/ IADLs .    (YW:VPXTGGY food)    Time 4    Period Weeks    Status On-going   Pt practiced cutting food, min difficulty keeping LUE on fork, min v.c              OT Long Term Goals - 06/01/21 1250       OT LONG TERM GOAL #1   Title Pt will perform all basic ADLS with supervision.    Time 12    Period Weeks    Status On-going    Target Date 07/08/21      OT LONG TERM GOAL #2   Title Pt will demonstrate ability to retrieve a lightweight object at 100* with LUE    Time 12    Period Weeks    Status On-going      OT LONG TERM GOAL #3   Title Pt will demonstrate improved fine motor coordination for ADLs  as evidenced by placing 9 pegs in 90 secs or less for 9 hole peg test.    Time 12    Period Weeks    Status On-going      OT LONG TERM GOAL #4   Title Pt will increase LUE grip strength by 5 lbs for increased LUE functional use.    Baseline RUE 65.9 LUE 33.7    Time 12    Period  Weeks    Status On-going      OT LONG TERM GOAL #5   Title Pt will perform simple beverage prep with supervision.    Time 12    Period Weeks    Status On-going                   Plan - 06/01/21 1258     Clinical Impression Statement Pt is progressing towards goals with improvements in LUE ROM, and coordination.    OT Occupational Profile and History Detailed Assessment- Review of Records and additional review of physical, cognitive, psychosocial history related to current functional performance    Occupational performance deficits (Please refer to evaluation for details): ADL's;IADL's;Rest and Sleep;Leisure;Social Participation    Body Structure / Function / Physical Skills ADL;UE functional use;Endurance;Balance;Flexibility;Pain;Vision;FMC;Gait;ROM;GMC;Coordination;Decreased knowledge of precautions;Decreased knowledge of use of DME;IADL;Strength;Dexterity;Mobility    Rehab Potential Good    Clinical Decision Making Several treatment options, min-mod task modification necessary   visual and cognitive deficits, decrease coordiantion   Comorbidities Affecting Occupational Performance: May have comorbidities impacting occupational performance  Alzheimer's, macular degeneration, CVA   Modification or Assistance to Complete Evaluation  Min-Moderate modification of tasks or assist with assess necessary to complete eval   visual deficits, extra cueing and  assist required   OT Frequency 2x / week    OT Duration 12 weeks    OT Treatment/Interventions Self-care/ADL training;Ultrasound;Visual/perceptual remediation/compensation;Patient/family education;DME and/or AE instruction;Aquatic Therapy;Paraffin;Passive range of motion;Balance training;Gait Training;Stair Training;Fluidtherapy;Cryotherapy;Electrical Stimulation;Therapist, nutritional;Therapeutic activities;Manual Therapy;Therapeutic exercise;Splinting;Moist Heat;Neuromuscular education;Cognitive remediation/compensation    Plan  continue functional use of LUE    Consulted and Agree with Plan of Care Patient             Patient will benefit from skilled therapeutic intervention in order to improve the following deficits and impairments:   Body Structure / Function / Physical Skills: ADL, UE functional use, Endurance, Balance, Flexibility, Pain, Vision, FMC, Gait, ROM, GMC, Coordination, Decreased knowledge of precautions, Decreased knowledge of use of DME, IADL, Strength, Dexterity, Mobility       Visit Diagnosis: Hemiplegia and hemiparesis following cerebral infarction affecting left non-dominant side (HCC)  Other lack of coordination  Attention and concentration deficit  Visuospatial deficit  Muscle weakness (generalized)  Unsteadiness on feet    Problem List Patient Active Problem List   Diagnosis Date Noted   Right pontine cerebrovascular accident ([place]) 03/12/2021   Acute ischemic stroke ([place]) 03/08/2021   Hyperkalemia 03/08/2021   Hypomagnesemia 03/08/2021   GERD (gastroesophageal reflux disease) 03/08/2021   Acute CVA (cerebrovascular accident) ([place]) 03/08/2021   COVID-19 01/01/2021   Sensorineural hearing loss (SNHL) of both ears 05/15/2017   Tinnitus, bilateral 05/15/2017   Cough 06/08/2015   Macular degeneration of both eyes 01/21/2014   Tobacco abuse, 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/08/2013   CAD (coronary artery disease)    Pericardial tamponade 09/17/2012   Acute pericarditis, unspecified 09/13/2012   Adenocarcinoma of left lung, stage 1 ([place]) 07/25/2011   Cancer of prostate w/med recur risk (T2b-c or Gleason 7 or PSA 10-20) ([place]) 07/25/2011   Nonmelanoma skin cancer 07/25/2011   History of colonic polyps 07/25/2011   Type 2 diabetes mellitus with vascular disease ([place]) 05/21/2007   HYPERCHOLESTEROLEMIA 05/21/2007   CARCINOMA, SKIN, SQUAMOUS CELL 01/08/2007   Generalized anxiety disorder 01/08/2007   ALZHEIMER'S DISEASE, MILD 01/08/2007   Essential hypertension 01/08/2007     Demitria Hay, OT 06/01/2021, 2:49 PM  [facility] [address] [place], [place], [address] Phone: [phone]   Fax:  [phone]  Name: MICHIAL DISNEY MRN: [id number] Date of Birth: October 04, 1930

## 2021-06-02 ENCOUNTER — Encounter: Payer: Self-pay | Admitting: Family Medicine

## 2021-06-02 ENCOUNTER — Other Ambulatory Visit: Payer: Self-pay | Admitting: Family Medicine

## 2021-06-02 ENCOUNTER — Ambulatory Visit (INDEPENDENT_AMBULATORY_CARE_PROVIDER_SITE_OTHER): Payer: Medicare HMO | Admitting: Family Medicine

## 2021-06-02 VITALS — BP 130/70 | HR 72 | Temp 97.7°F | Ht 69.0 in | Wt 174.3 lb

## 2021-06-02 DIAGNOSIS — I639 Cerebral infarction, unspecified: Secondary | ICD-10-CM | POA: Diagnosis not present

## 2021-06-02 DIAGNOSIS — R21 Rash and other nonspecific skin eruption: Secondary | ICD-10-CM | POA: Diagnosis not present

## 2021-06-02 DIAGNOSIS — F411 Generalized anxiety disorder: Secondary | ICD-10-CM | POA: Diagnosis not present

## 2021-06-02 DIAGNOSIS — E1159 Type 2 diabetes mellitus with other circulatory complications: Secondary | ICD-10-CM

## 2021-06-02 DIAGNOSIS — R69 Illness, unspecified: Secondary | ICD-10-CM | POA: Diagnosis not present

## 2021-06-02 MED ORDER — SERTRALINE HCL 50 MG PO TABS
50.0000 mg | ORAL_TABLET | Freq: Every day | ORAL | 0 refills | Status: DC
Start: 2021-06-02 — End: 2021-07-19

## 2021-06-02 MED ORDER — SERTRALINE HCL 50 MG PO TABS: 150.0000 mg | ORAL_TABLET | Freq: Every day | ORAL | 1 refills | Status: DC

## 2021-06-02 MED ORDER — TRIAMCINOLONE ACETONIDE 0.1 % EX CREA: 1.0000 "application " | TOPICAL_CREAM | Freq: Two times a day (BID) | CUTANEOUS | 0 refills | Status: DC

## 2021-06-02 MED ORDER — GLIPIZIDE 10 MG PO TABS: 10.0000 mg | ORAL_TABLET | Freq: Two times a day (BID) | ORAL | 1 refills | Status: DC

## 2021-06-02 NOTE — Telephone Encounter (Signed)
Change Rx or do PA.   If you want to keep patient on Zoloft,  maybe consider sending in 100 mg tablet and 50 mg tablet.  Please advise.  ?

## 2021-06-02 NOTE — Telephone Encounter (Signed)
Please convert to a 100 mg tablet and 50 mg tablet instead. ? ?100 mg #90, 0 ref ?50 mg # 90, 0 ref ? ?Thanks! ?

## 2021-06-02 NOTE — Telephone Encounter (Signed)
Insurance will not covee 3 tablets daily so per [name] Prescriptions for Zoloft 100 mg tablet and 50 mg tablets to take one each daily to equal 150 mg daily as instructed by [name].  Mrs. [name] notified by telephone of change and states understanding.  ?

## 2021-06-02 NOTE — Progress Notes (Signed)
Ethan Hausler T. Ezeriah Luty, MD, [facility] [place], [address]  Phone: [phone]   FAX: [phone]  TEDRICK PORT - 88 y.o. male   MRN [id number]   Date of Birth: 1931-02-16  Date: 06/02/2021   PCP: [name], MD   Referral: [name], MD  Chief Complaint  Patient presents with   Follow-up    [facility]-ED for Left Side Weakness    This visit occurred during the SARS-CoV-2 public health emergency.  Safety protocols were in place, including screening questions prior to the visit, additional usage of staff PPE, and extensive cleaning of exam room while observing appropriate contact time as indicated for disinfecting solutions.   Subjective:   [name] is a 88 y.o. very pleasant male patient with Body mass index is 25.74 kg/m. who presents with the following:  [name] is here to follow-up from a recent ER visit 05/19/2021:  He was seen on that day for some concerns by his wife with some additional left-sided weakness and slurred speech.  He does have a baseline of prior stroke with some left-sided deficits, and he was initially placed as a code stroke.  CT of the head without contrast at that time showed no changes. MRI of the brain also was obtained, and these did not show any significant changes compared with prior MRI with his stroke.  While in the ER, they did consult neurology.  After their discussion, he felt like he was safe for outpatient follow-up. When he is in the ER, his symptoms did improve, and he was ambulating with a walker.  This compares to the last time I saw him, when he was in a wheelchair.  Both he and his wife do feel like he has been getting better compared to when he had his prior stroke, which he was admitted on March 07, 2021.  He has been very active and doing rehab and has been followed by physical medicine and rehabilitation after his pontine stroke.  Dr. Leonie Man is his  Neurologist.  Lab Results  Component Value Date   HGBA1C 8.3 (H) 03/08/2021    He is also having some increase in his anxiety as well as some depression after his stroke.  He has been on Zoloft 100 mg for years.  Does have a caregiver, and she has been helping them out a great deal.  Impatient and irritable  Diabetes Mellitus: Tolerating Medications: yes Compliance with diet: fair, Body mass index is 25.74 kg/m. Exercise: minimal / intermittent Avg blood sugars at home: not checking Foot problems: none Hypoglycemia: none No nausea, vomitting, blurred vision, polyuria.  Lab Results  Component Value Date   HGBA1C 8.3 (H) 03/08/2021   HGBA1C 7.3 (H) 05/11/2020   HGBA1C 7.0 (H) 12/20/2019   Lab Results  Component Value Date   MICROALBUR 7.8 (H) 12/20/2019   LDLCALC 83 03/08/2021   CREATININE 1.50 (H) 05/19/2021    Wt Readings from Last 3 Encounters:  06/02/21 174 lb 5 oz (79.1 kg)  05/27/21 176 lb (79.8 kg)  05/18/21 176 lb 6.4 oz (80 kg)     R LE itchy, rash  Review of Systems is noted in the HPI, as appropriate  Objective:   BP 130/70    Pulse 72    Temp 97.7 F (36.5 C) (Oral)    Ht 5\' 9"  (1.753 m)    Wt 174 lb 5 oz (79.1 kg)    SpO2  99%    BMI 25.74 kg/m   GEN: No acute distress; alert,appropriate. PULM: Breathing comfortably in no respiratory distress PSYCH: Normally interactive.  CV: RRR, no m/g/r  PULM: Normal respiratory rate, no accessory muscle use. No wheezes, crackles or rhonchi  He continues to have some global left-sided weakness.  There is a reddened rash with some excoriation right lower extremity  Laboratory and Imaging Data: MR BRAIN WO CONTRAST  Result Date: 05/19/2021 CLINICAL DATA:  Acute neuro deficit. Left-sided weakness, slurred speech EXAM: MRI HEAD WITHOUT CONTRAST TECHNIQUE: Multiplanar, multiecho pulse sequences of the brain and surrounding structures were obtained without intravenous contrast. COMPARISON:  MRI head 03/07/2021.  CT  head 05/19/2021 FINDINGS: Brain: Negative for acute infarct. Extensive cortical atrophy. Prominent subdural hygromas around the cerebral cortex bilaterally unchanged from prior studies. Negative for hydrocephalus. Mild chronic microvascular ischemic change in the white matter. Negative for hemorrhage or mass lesion. No midline shift. Vascular: Normal arterial flow voids. Skull and upper cervical spine: No focal skeletal lesion. Sinuses/Orbits: Paranasal sinuses clear. Bilateral cataract extraction Other: None IMPRESSION: Negative for acute infarct. 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 Electronically Signed   By: Franchot Gallo M.D.   On: 05/19/2021 17:41   DG Chest Port 1 View  Result Date: 05/19/2021 CLINICAL DATA:  Weakness. EXAM: PORTABLE CHEST 1 VIEW COMPARISON:  March 07, 2021. FINDINGS: Stable cardiomediastinal silhouette. Right lung is clear. Postsurgical changes are noted in the left lower lung field with associated scarring and pleural thickening. Bony thorax is unremarkable. IMPRESSION: Stable chronic findings seen in left lung base. No acute abnormality is noted. Electronically Signed   By: Marijo Conception M.D.   On: 05/19/2021 15:33   CT HEAD CODE STROKE WO CONTRAST  Result Date: 05/19/2021 CLINICAL DATA:  Neuro deficit, acute, stroke suspected. Additional history provided: Left-sided weakness, slurred speech. EXAM: CT HEAD WITHOUT CONTRAST TECHNIQUE: Contiguous axial images were obtained from the base of the skull through the vertex without intravenous contrast. RADIATION DOSE REDUCTION: This exam was performed according to the departmental dose-optimization program which includes automated exposure control, adjustment of the mA and/or kV according to patient size and/or use of iterative reconstruction technique. COMPARISON:  MRI brain and MRA head 03/08/2021. FINDINGS: Brain: Unchanged from the prior brain MRI of 03/08/2021, there is advanced cerebral  volume loss with symmetric prominence of the extra-axial CSF spaces (along the frontoparietal lobes). Superimposed chronic subdural hygromas are difficult to exclude. No midline shift. Mild patchy and ill-defined hypoattenuation within the cerebral white matter, nonspecific but compatible with chronic small vessel ischemic disease. Prominent perivascular space within the inferior left basal ganglia. Known chronic infarct within the ventral pons, better appreciated on the prior brain MRI of 03/07/2021 (acute at that time). A small chronic subdural hygroma is also questioned along the right cerebellar hemisphere, unchanged. There is no acute intracranial hemorrhage. No demarcated cortical infarct. No extra-axial fluid collection. No evidence of an intracranial mass. No midline shift. Vascular: No hyperdense vessel.  Atherosclerotic calcifications. Skull: Normal. Negative for fracture or focal lesion. Sinuses/Orbits: Visualized orbits show no acute finding. No significant paranasal sinus disease. ASPECTS [facility] Stroke Program Early CT Score) - Ganglionic level infarction (caudate, lentiform nuclei, internal capsule, insula, M1-M3 cortex): 7 - Supraganglionic infarction (M4-M6 cortex): 3 Total score (0-10 with 10 being normal): 10 No acute intracranial hemorrhage or evidence of acute infarct. These results were called by telephone at the time of interpretation on 05/19/2021 at 3:05 pm to provider Dr.  Melina Copa, who verbally acknowledged these results. IMPRESSION: No evidence of acute intracranial abnormality. Mild chronic small vessel ischemic changes within the cerebral white matter. Known chronic infarct within the ventral pons. Unchanged from the brain MRI of 03/08/2021, there is advanced cerebral atrophy with symmetric prominence of the extra-axial CSF spaces (most notably along the frontoparietal lobes). Superimposed bilateral subdural hygromas at these sites cannot be excluded. No midline shift. An unchanged small  subdural hygroma is also questioned along the right cerebellar hemisphere. Electronically Signed   By: Kellie Simmering D.O.   On: 05/19/2021 15:09     Assessment and Plan:     ICD-10-CM   1. Acute ischemic stroke (HCC)  I63.9     2. Type 2 diabetes mellitus with vascular disease (HCC)  E11.59 glipiZIDE (GLUCOTROL) 10 MG tablet    3. Generalized anxiety disorder  F41.1     4. Rash  R21      From a stroke standpoint, he does seem to be improving, and he is certainly stronger.  Diabetes is doing worse.  A1c is now 8.3.  Think that the most obvious thing to do here is increase his glipizide and then have close follow-up.  Anxiety and to some degree depression is worsened after his stroke.  He has been on Zoloft for some time, so I am going to increase this to 150 mg.  Rash, not entirely sure of the etiology, but it is itchy and I am going to place him on some triamcinolone.   Meds ordered this encounter  Medications   DISCONTD: sertraline (ZOLOFT) 50 MG tablet    Sig: Take 3 tablets (150 mg total) by mouth daily.    Dispense:  270 tablet    Refill:  1   triamcinolone cream (KENALOG) 0.1 %    Sig: Apply 1 application topically 2 (two) times daily.    Dispense:  454 g    Refill:  0   glipiZIDE (GLUCOTROL) 10 MG tablet    Sig: Take 1 tablet (10 mg total) by mouth 2 (two) times daily before a meal.    Dispense:  180 tablet    Refill:  1   Medications Discontinued During This Encounter  Medication Reason   sertraline (ZOLOFT) 100 MG tablet    glipiZIDE (GLUCOTROL) 5 MG tablet    No orders of the defined types were placed in this encounter.   Follow-up: Return in about 6 weeks (around 07/14/2021).  [employer] One speech-to-text software was used for transcription in this dictation.  Possible transcriptional errors can occur using Editor, commissioning.   Signed,  [name]. Rosaria Kubin, MD   Outpatient Encounter Medications as of 06/02/2021  Medication Sig   aspirin EC 81 MG tablet  Take 81 mg by mouth daily as needed for moderate pain.   atorvastatin (LIPITOR) 20 MG tablet Take 1 tablet (20 mg total) by mouth daily.   Continuous Blood Gluc Receiver (FREESTYLE LIBRE 2 READER) DEVI Use to check blood sugar continous   Continuous Blood Gluc Sensor (FREESTYLE LIBRE 2 SENSOR) MISC Use to check blood sugar continuous   donepezil (ARICEPT) 10 MG tablet TAKE 1 TABLET BY MOUTH EVERYDAY AT BEDTIME   ferrous sulfate 325 (65 FE) MG tablet Take 1 tablet (325 mg total) by mouth daily with breakfast.   Multiple Vitamins-Minerals (ICAPS LUTEIN & ZEAXANTHIN PO) Take 1 capsule by mouth in the morning and at bedtime. I - Caps   Multiple Vitamins-Minerals (ICAPS) CAPS Take by mouth.   Omega-3  Fatty Acids (FISH OIL) 1000 MG CAPS Take 1 capsule (1,000 mg total) by mouth in the morning and at bedtime.   pantoprazole (PROTONIX) 40 MG tablet Take 1 tablet (40 mg total) by mouth daily.   Propylene Glycol (SYSTANE BALANCE) 0.6 % SOLN Apply 1 drop to eye 2 (two) times daily as needed (dry eyes).   tamsulosin (FLOMAX) 0.4 MG CAPS capsule Take 1 capsule (0.4 mg total) by mouth daily after supper.   triamcinolone cream (KENALOG) 0.1 % Apply 1 application topically 2 (two) times daily.   vitamin B-12 (CYANOCOBALAMIN) 1000 MCG tablet Take 1 tablet (1,000 mcg total) by mouth daily.   [DISCONTINUED] glipiZIDE (GLUCOTROL) 5 MG tablet Take 1 tablet (5 mg total) by mouth 2 (two) times daily before a meal.   [DISCONTINUED] sertraline (ZOLOFT) 100 MG tablet Take 1 tablet (100 mg total) by mouth daily.   glipiZIDE (GLUCOTROL) 10 MG tablet Take 1 tablet (10 mg total) by mouth 2 (two) times daily before a meal.   [DISCONTINUED] sertraline (ZOLOFT) 50 MG tablet Take 3 tablets (150 mg total) by mouth daily.   No facility-administered encounter medications on file as of 06/02/2021.

## 2021-06-04 ENCOUNTER — Other Ambulatory Visit: Payer: Self-pay

## 2021-06-04 ENCOUNTER — Ambulatory Visit: Payer: Medicare HMO | Admitting: Physical Therapy

## 2021-06-04 ENCOUNTER — Ambulatory Visit: Payer: Medicare HMO | Attending: Family Medicine | Admitting: Occupational Therapy

## 2021-06-04 VITALS — BP 158/88 | HR 71

## 2021-06-04 DIAGNOSIS — R2689 Other abnormalities of gait and mobility: Secondary | ICD-10-CM | POA: Diagnosis present

## 2021-06-04 DIAGNOSIS — I69354 Hemiplegia and hemiparesis following cerebral infarction affecting left non-dominant side: Secondary | ICD-10-CM | POA: Insufficient documentation

## 2021-06-04 DIAGNOSIS — M6281 Muscle weakness (generalized): Secondary | ICD-10-CM | POA: Insufficient documentation

## 2021-06-04 DIAGNOSIS — R262 Difficulty in walking, not elsewhere classified: Secondary | ICD-10-CM | POA: Diagnosis present

## 2021-06-04 DIAGNOSIS — R278 Other lack of coordination: Secondary | ICD-10-CM | POA: Diagnosis present

## 2021-06-04 DIAGNOSIS — R41840 Attention and concentration deficit: Secondary | ICD-10-CM | POA: Diagnosis present

## 2021-06-04 DIAGNOSIS — R41842 Visuospatial deficit: Secondary | ICD-10-CM | POA: Diagnosis present

## 2021-06-04 DIAGNOSIS — R2681 Unsteadiness on feet: Secondary | ICD-10-CM | POA: Diagnosis present

## 2021-06-04 NOTE — Therapy (Signed)
[facility] [address] [place], [place], [address] Phone: [phone]   Fax:  [phone]  Occupational Therapy Treatment  Patient Details  Name: [name] MRN: [id number] Date of Birth: 02-17-1931 No data recorded  Encounter Date: 06/04/2021   OT End of Session - 06/04/21 1153     Visit Number 12    Number of Visits 25    Date for OT Re-Evaluation 07/08/21    Authorization Type [employer] Medicare    Authorization - Visit Number 12    Progress Note Due on Visit 10    OT Start Time 1148    OT Stop Time 1228    OT Time Calculation (min) 40 min    Activity Tolerance Patient tolerated treatment well    Behavior During Therapy [facility] for tasks assessed/performed             Past Medical History:  Diagnosis Date   Adenocarcinoma of left lung, stage 1 ([place]) 2001   T1N0 stage I  adenoca left lung resected 01/03/00    Alzheimer's disease ([place])    CAD (coronary artery disease) 2014   a. 09/2012 Cath: LM nl, LAD 50-60p, D1 60-70 m, D1 50ost, LCX nl, RCA min irregs, EF 55-65%.   Generalized anxiety disorder 01/08/2007   Qualifier: Diagnosis of  By: Diona Browner MD, [name]     History of colonic polyps 07/25/2011   Macular degeneration of both eyes 01/21/2014   Nonmelanoma skin cancer 07/25/2011   Multiple lesions excised face/nose   Pericarditis    a. 09/2012 with effusion and tamponade, s/p window.  b.  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 SCC IN SITU TX CX3   SCC (squamous cell carcinoma) 04/12/2005   RIGHT NECK SUP. SCC IN SITU TX EXC   SCC (squamous cell carcinoma) 04/16/2013   LEFT TEMPLE SCC IN SITU TX CX3 5FU   SCC (squamous cell carcinoma) 04/16/2013   BELOW LEFT EYE SCC IN SITU TX CX3 5FU   SCC (squamous cell carcinoma) 04/16/2013   BELOW RIGHT EYE SCC IN SITU TX CX3 5FU   SCC (squamous cell carcinoma) 08/04/2014   LEFT CHEEK SCC IN SITU TX CX3 5FU   SCC (squamous cell carcinoma) 11/27/2018   LEFT TEMPLE SCC IN SITU TX WITH BX   SCC (squamous cell carcinoma)    SCC (squamous cell carcinoma)    SCC (squamous cell carcinoma)    SCC (squamous cell carcinoma) Well Diff 09/13/2016   Tip of Nose SCC WELL DIFF TX (MOH's), and RIGHT INNER EYE SUP. SCC IN SITU TX TO WATCH   SCC (squamous cell carcinoma) Well Diff 05/30/2017   Right Cheekbone (Cx3,5FU) and Under Left Eye (Cx3,5FU)  Squamous cell carcinoma in situ (SCCIS) 04/12/2005   Right Neck Inf (Cx3), Right Neck Sup (Exc), and Bulb of Nose (Cx3,Exc)   Squamous cell carcinoma in situ (SCCIS) 09/27/2005   Nose (Cx3,Exc)   Squamous cell carcinoma in situ (SCCIS) 02/18/2013   Below Left Eye (tx p bx)   Squamous cell carcinoma in situ (SCCIS) 04/16/2013   Left Temple (Cx3,5FU), Below Left Eye (Cx3,5FU), Below Right Eye (Cx3,5FU)   Squamous cell carcinoma in situ (SCCIS) 08/04/2014   Left Cheek (Cx3,5FU)   Squamous cell carcinoma in situ (SCCIS) 09/13/2016   Right Inner Eye Sup. 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   Type 2 diabetes mellitus  with vascular disease ([place]) 05/21/2007   Qualifier: Diagnosis of  By: Diona Browner MD, [name]     Type 2 DM with CKD stage 3 and hypertension ([place]) 07/16/2015   Unspecified essential hypertension     Past Surgical History:  Procedure Laterality Date   HERNIA REPAIR     LEFT HEART CATHETERIZATION WITH CORONARY ANGIOGRAM N/A 09/13/2012   Procedure: LEFT HEART CATHETERIZATION WITH CORONARY ANGIOGRAM;  Surgeon: Sherren Mocha, MD;  Location: [facility] CATH LAB;  Service: Cardiovascular;  Laterality: N/A;   LOBECTOMY  2001   upper left   PERICARDIAL TAP N/A 09/16/2012   Procedure: PERICARDIAL TAP;  Surgeon: Sinclair Grooms, MD;  Location: [facility] CATH LAB;  Service: Cardiovascular;  Laterality: N/A;   PROSTATECTOMY  2003   SUBXYPHOID PERICARDIAL WINDOW N/A 09/16/2012   Procedure: SUBXYPHOID PERICARDIAL WINDOW;  Surgeon: Rexene Alberts, MD;  Location: [facility];  Service: Thoracic;  Laterality: N/A;   TONSILLECTOMY      There were no vitals filed for this visit.   Subjective Assessment - 06/04/21 1149     Subjective  no pain    Pertinent History Pt is an 88 year old man admitted on 03/07/21 with L side weakness. MRI + for small foci ischemia R pons and L frontal periventricular white matter. PMH. Alzheimers dementia, DM, HTN, stage 3 CKD, lung ca, prostate ca, multiple skin cancers, CAD, anxiety, macular degeneration. Pt lives with wife who has hired caregiver, children currently assisting    Limitations HOH, macular degeneration    Special Tests Goes by [name].    Patient Stated Goals get left  arm working, increase independence    Currently in Pain? No/denies                  Treatment: Supine Closed chain shoulder flexion, chest press, shoulder horizontal abduction with PVC pipe frame, min v.c and facilitation Seated functional midrange shoulder flexion with PVC pipe frame, min v.c and facilitation Arm bike x 6 mins level 1 for conditioning. Functional reaching low/mid range to place large pegs in  vertical pegboard, min-mod difficulty due to macular degeneration, pt used finger of other hand to locate hole  then placing and removing yellow-green graded clothespins from antennae for sustained pinch with LUE  and functional reach, min v.c and facilitation.                   OT Short Term Goals - 06/04/21 1230  OT SHORT TERM GOAL #1   Title I with inital HEP.    Time 4    Period Weeks    Status Achieved    Target Date 05/13/21      OT SHORT TERM GOAL #2   Title Pt will donn shirt with set up and supervision and pants with min A.    Time 4    Period Weeks    Status Achieved      OT SHORT TERM GOAL #3   Title Pt will demonstrate 90* shoulder flexion in prep for functional reach with LUE with no more than min compensations.    Time 4    Period Weeks    Status On-going   72* with mod compensation     OT SHORT TERM GOAL #4   Title Pt will verbalize understanding of memory compensation strategies.    Time 4    Period Weeks    Status On-going   issued may benefit from reinforcement     OT SHORT TERM GOAL #5   Title Pt will demonstrate improved LUE functional use as evidenced by increasing box/ blocks score by 5 blocks    Baseline R 43 blocks, LUE 28 blocks    Time 4    Period Weeks    Status Achieved   34  blocks 05/25/21     OT SHORT TERM GOAL #6   Title Pt will verbalize understanding of adapted strategies to maximize safety and I with ADLs/ IADLs .    (WJ:[id number] food)    Time 4    Period Weeks    Status On-going   Pt practiced cutting food, min difficulty keeping LUE on fork, min v.c              OT Long Term Goals - 06/01/21 1250       OT LONG TERM GOAL #1   Title Pt will perform all basic ADLS with supervision.    Time 12    Period Weeks    Status On-going    Target Date 07/08/21      OT LONG TERM GOAL #2   Title Pt will demonstrate ability to retrieve a lightweight object at 100* with LUE    Time 12    Period Weeks    Status  On-going      OT LONG TERM GOAL #3   Title Pt will demonstrate improved fine motor coordination for ADLs  as evidenced by placing 9 pegs in 90 secs or less for 9 hole peg test.    Time 12    Period Weeks    Status On-going      OT LONG TERM GOAL #4   Title Pt will increase LUE grip strength by 5 lbs for increased LUE functional use.    Baseline RUE 65.9 LUE 33.7    Time 12    Period Weeks    Status On-going      OT LONG TERM GOAL #5   Title Pt will perform simple beverage prep with supervision.    Time 12    Period Weeks    Status On-going                   Plan - 06/04/21 1226     Clinical Impression Statement Pt demonstrates good overall progress. He demonstrates improving endurance and LUE functional use.    OT Occupational Profile and History Detailed Assessment- Review of Records and additional review of physical, cognitive, psychosocial history  related to current functional performance    Occupational performance deficits (Please refer to evaluation for details): ADL's;IADL's;Rest and Sleep;Leisure;Social Participation    Body Structure / Function / Physical Skills ADL;UE functional use;Endurance;Balance;Flexibility;Pain;Vision;FMC;Gait;ROM;GMC;Coordination;Decreased knowledge of precautions;Decreased knowledge of use of DME;IADL;Strength;Dexterity;Mobility    Rehab Potential Good    Clinical Decision Making Several treatment options, min-mod task modification necessary   visual and cognitive deficits, decrease coordiantion   Comorbidities Affecting Occupational Performance: May have comorbidities impacting occupational performance   Alzheimer's, macular degeneration, CVA   Modification or Assistance to Complete Evaluation  Min-Moderate modification of tasks or assist with assess necessary to complete eval   visual deficits, extra cueing and  assist required   OT Frequency 2x / week    OT Duration 12 weeks    OT Treatment/Interventions Self-care/ADL  training;Ultrasound;Visual/perceptual remediation/compensation;Patient/family education;DME and/or AE instruction;Aquatic Therapy;Paraffin;Passive range of motion;Balance training;Gait Training;Stair Training;Fluidtherapy;Cryotherapy;Electrical Stimulation;Therapist, nutritional;Therapeutic activities;Manual Therapy;Therapeutic exercise;Splinting;Moist Heat;Neuromuscular education;Cognitive remediation/compensation    Plan continue functional use of LUE, NMR, coordination as able.    Consulted and Agree with Plan of Care Patient             Patient will benefit from skilled therapeutic intervention in order to improve the following deficits and impairments:   Body Structure / Function / Physical Skills: ADL, UE functional use, Endurance, Balance, Flexibility, Pain, Vision, FMC, Gait, ROM, GMC, Coordination, Decreased knowledge of precautions, Decreased knowledge of use of DME, IADL, Strength, Dexterity, Mobility       Visit Diagnosis: Hemiplegia and hemiparesis following cerebral infarction affecting left non-dominant side (HCC)  Other lack of coordination  Attention and concentration deficit  Visuospatial deficit  Muscle weakness (generalized)  Unsteadiness on feet  Other abnormalities of gait and mobility    Problem List Patient Active Problem List   Diagnosis Date Noted   Right pontine cerebrovascular accident ([place]) 03/12/2021   Acute ischemic stroke ([place]) 03/08/2021   Hyperkalemia 03/08/2021   Hypomagnesemia 03/08/2021   GERD (gastroesophageal reflux disease) 03/08/2021   Acute CVA (cerebrovascular accident) ([place]) 03/08/2021   COVID-19 01/01/2021   Sensorineural hearing loss (SNHL) of both ears 05/15/2017   Tinnitus, bilateral 05/15/2017   Cough 06/08/2015   Macular degeneration of both eyes 01/21/2014   Tobacco abuse, 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/08/2013   CAD (coronary artery disease)    Pericardial tamponade 09/17/2012   Acute pericarditis, unspecified 09/13/2012    Adenocarcinoma of left lung, stage 1 ([place]) 07/25/2011   Cancer of prostate w/med recur risk (T2b-c or Gleason 7 or PSA 10-20) ([place]) 07/25/2011   Nonmelanoma skin cancer 07/25/2011   History of colonic polyps 07/25/2011   Type 2 diabetes mellitus with vascular disease ([place]) 05/21/2007   HYPERCHOLESTEROLEMIA 05/21/2007   CARCINOMA, SKIN, SQUAMOUS CELL 01/08/2007   Generalized anxiety disorder 01/08/2007   ALZHEIMER'S DISEASE, MILD 01/08/2007   Essential hypertension 01/08/2007    Bayron Dalto, OT 06/04/2021, 12:50 PM  [facility] [address] [place], [place], [address] Phone: [phone]   Fax:  [phone]  Name: [name] MRN: [id number] Date of Birth: Apr 26, 1930

## 2021-06-04 NOTE — Therapy (Signed)
[facility] [address] [place], [place], [address] Phone: [phone]   Fax:  [phone]  Physical Therapy Treatment  Patient Details  Name: [name] MRN: [id number] Date of Birth: [birth year] Referring Provider (PT): Cathlyn Parsons, PA-C   Encounter Date: 06/04/2021   PT End of Session - 06/04/21 1257     Visit Number 11   arrive no charge   Number of Visits 24    Date for PT Re-Evaluation 07/23/21    Authorization Type Aetna Medicare    Progress Note Due on Visit 19   progress note completed on 9th visit   Activity Tolerance Treatment limited secondary to medical complications (Comment)    Behavior During Therapy [facility] for tasks assessed/performed             Past Medical History:  Diagnosis Date   Adenocarcinoma of left lung, stage 1 ([place]) 2001   T1N0 stage I  adenoca left lung resected 01/03/00    Alzheimer's disease ([place])    CAD (coronary artery disease) 2014   a. 09/2012 Cath: LM nl, LAD 50-60p, D1 60-70 m, D1 50ost, LCX nl, RCA min irregs, EF 55-65%.   Generalized anxiety disorder 01/08/2007   Qualifier: Diagnosis of  By: Diona Browner MD, [name]     History of colonic polyps 07/25/2011   Macular degeneration of both eyes 01/21/2014   Nonmelanoma skin cancer 07/25/2011   Multiple lesions excised face/nose   Pericarditis    a. 09/2012 with effusion and tamponade, s/p window.  b.  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 SCC IN SITU TX CX3   SCC (squamous cell carcinoma) 04/12/2005   RIGHT NECK SUP. SCC IN SITU TX EXC   SCC (squamous cell carcinoma) 04/16/2013   LEFT TEMPLE SCC IN SITU TX CX3 5FU   SCC (squamous cell carcinoma) 04/16/2013   BELOW LEFT EYE SCC IN SITU TX CX3 5FU   SCC (squamous cell carcinoma) 04/16/2013   BELOW RIGHT EYE SCC IN SITU TX CX3 5FU   SCC (squamous cell carcinoma) 08/04/2014   LEFT CHEEK SCC IN SITU TX CX3 5FU   SCC (squamous cell carcinoma) 11/27/2018   LEFT TEMPLE SCC IN SITU TX WITH BX   SCC (squamous cell carcinoma)    SCC (squamous cell carcinoma)    SCC (squamous cell carcinoma)    SCC (squamous cell carcinoma) Well Diff 09/13/2016   Tip of Nose SCC WELL DIFF TX (MOH's), and RIGHT INNER EYE SUP. SCC IN SITU TX TO WATCH   SCC (squamous cell carcinoma) Well Diff 05/30/2017   Right Cheekbone (Cx3,5FU) and Under Left Eye (Cx3,5FU)   Squamous cell carcinoma in situ (SCCIS) 04/12/2005   Right Neck Inf (  Cx3), Right Neck Sup (Exc), and Bulb of Nose (Cx3,Exc)   Squamous cell carcinoma in situ (SCCIS) 09/27/2005   Nose (Cx3,Exc)   Squamous cell carcinoma in situ (SCCIS) 02/18/2013   Below Left Eye (tx p bx)   Squamous cell carcinoma in situ (SCCIS) 04/16/2013   Left Temple (Cx3,5FU), Below Left Eye (Cx3,5FU), Below Right Eye (Cx3,5FU)   Squamous cell carcinoma in situ (SCCIS) 08/04/2014   Left Cheek (Cx3,5FU)   Squamous cell carcinoma in situ (SCCIS) 09/13/2016   Right Inner Eye Sup. 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   Type 2 diabetes mellitus  with vascular disease ([place]) 05/21/2007   Qualifier: Diagnosis of  By: Diona Browner MD, [name]     Type 2 DM with CKD stage 3 and hypertension ([place]) 07/16/2015   Unspecified essential hypertension     Past Surgical History:  Procedure Laterality Date   HERNIA REPAIR     LEFT HEART CATHETERIZATION WITH CORONARY ANGIOGRAM N/A 09/13/2012   Procedure: LEFT HEART CATHETERIZATION WITH CORONARY ANGIOGRAM;  Surgeon: Sherren Mocha, MD;  [facility] C[facility] LAB;  Service: Cardiovascular;  Laterality: N/A;   LOBECTOMY  2001   upper left   PERICARDIAL TAP N/A 09/16/2012   Procedure: PERICARDIAL TAP;  Surgeon: Sinclair Grooms, MD;  Location: [facility] CATH LAB;  Service: Cardiovascular;  Laterality: N/A;   PROSTATECTOMY  2003   SUBXYPHOID PERICARDIAL WINDOW N/A 09/16/2012   Procedure: SUBXYPHOID PERICARDIAL WINDOW;  Surgeon: Rexene Alberts, MD;  Location: [facility];  Service: Thoracic;  Laterality: N/A;   TONSILLECTOMY      Vitals:   06/04/21 1238  BP: (!) 158/88  Pulse: 71     Subjective Assessment - 06/04/21 1235     Subjective Pt received from OT; nose is bleeding.  Pt holding pressure on nostrils.  Patient wondering if he should do the second therapy.  CBG: 180.    Pertinent History R pontine CVA 03/07/21. History of Alzheimer's dementia maintained on Aricept type 2 diabetes mellitus hypertension CKD stage III macular degeneration adenocarcinoma left lung with upper lobe resection 2001 prostate cancer with prostatectomy    Limitations Walking;Standing;House hold activities    How long can you sit comfortably? n/a    How long can you stand comfortably? As long as holding on to something    How long can you walk comfortably? Mostly in the house    Patient Stated Goals Pt: walk again without RW. Family: pt amb ind to bathroom (currently requires assist for safety)    Currently in Pain? No/denies            Despite placing constant pressure on R nostril and using ice on outside of nostril, unable to get nose  bleed to clot.  Unable to participate in therapy session today due to needing to keep constant pressure on nose.  Pt to return to therapy on Tuesday.   PT Short Term Goals - 05/25/21 1240       PT SHORT TERM GOAL #1   Title Pt will report independence with progressive HEP and report completing >/= 3x/wk with supervision as needed (ALL STGs Due: 06/25/21)    Baseline reports completed  2-3x/weekly and independence; will benefit from progressive HEP    Time 4    Period Weeks    Status Revised    Target Date 06/25/21      PT SHORT TERM GOAL #2   Title Pt will have improved 5x STS to </=15 sec to demo improved balance    Baseline 18.75 secs on 2/21    Time 4    Period Weeks    Status Revised      PT SHORT TERM GOAL #3   Title Pt will be able to ambulate >/= 400 ft with RW without rest break to demo improved endurance and household mobility    Baseline 300 ft, 8/10 fatigue    Time 4    Period Weeks    Status Revised      PT SHORT TERM GOAL #4   Title Pt will have improved Berg Balance Score to at least 42/56    Baseline 36/56; 38/56; 39/56    Time 4    Period Weeks    Status Revised      PT SHORT TERM GOAL #5   Title Pt will demo improved TUG score to </=15 sec for decreased fall risk    Baseline 25.09 sec on eval; 16.59 secs with RW; 18.60 secs    Time 4    Period Weeks    Status Revised    Target Date --               PT Long Term Goals - 05/25/21 1244       PT LONG TERM GOAL #1   Title Pt will be independent with final and progressive HEP (All LTGs Due: 07/23/21)    Baseline HEP established, not completing frequently    Time 8    Period Weeks    Status New    Target Date 07/23/21      PT LONG TERM GOAL #2   Title Pt will be able to amb with LRAD mod I x 800' indoors for improved community mobility and activity tolerance    Baseline 300 ft with RW    Time 8    Period Weeks    Status Revised      PT LONG TERM GOAL #3   Title Pt will demo improved Berg  Balance Score of at least 45/56 for decreased fall risk    Baseline 39/56    Time 8    Period Weeks    Status New      PT LONG TERM GOAL #4   Title Pt will improve gait speed to >/= 2.0 ft/sec to demo improved community mobility    Baseline 1.36 ft/sec    Time 8    Period Weeks    Status New      PT LONG TERM GOAL #5   Title Pt will have improved FOTO score of 65    Baseline 49    Time 8    Period Weeks    Status New    Target Date 06/09/21      Additional Long Term Goals   Additional Long Term Goals Yes      PT LONG TERM GOAL #6   Title Pt will improve TUG to </= 12 seconds with LRAD to demo reduced fall risk    Baseline 18.60 secs    Time 8    Period Weeks    Status New      PT LONG TERM GOAL #7   Title Pt will  improve 5x sit <> stand to </= 12 seconds to demo improved balance    Baseline 18.75 secs    Time 8    Period Weeks    Status New             Visit Diagnosis: Hemiplegia and hemiparesis following cerebral infarction affecting left non-dominant side [facility])   Problem List Patient Active Problem List   Diagnosis Date Noted   Right pontine cerebrovascular accident ([place]) 03/12/2021   Acute ischemic stroke ([place]) 03/08/2021   Hyperkalemia 03/08/2021   Hypomagnesemia 03/08/2021   GERD (gastroesophageal reflux disease) 03/08/2021   Acute CVA (cerebrovascular accident) ([place]) 03/08/2021   COVID-19 01/01/2021   Sensorineural hearing loss (SNHL) of both ears 05/15/2017   Tinnitus, bilateral 05/15/2017   Cough 06/08/2015   Macular degeneration of both eyes 01/21/2014   Tobacco abuse, 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/08/2013   CAD (coronary artery disease)    Pericardial tamponade 09/17/2012   Acute pericarditis, unspecified 09/13/2012   Adenocarcinoma of left lung, stage 1 ([place]) 07/25/2011   Cancer of prostate w/med recur risk (T2b-c or Gleason 7 or PSA 10-20) ([place]) 07/25/2011   Nonmelanoma skin cancer 07/25/2011   History of colonic polyps 07/25/2011   Type 2 diabetes  mellitus with vascular disease ([place]) 05/21/2007   HYPERCHOLESTEROLEMIA 05/21/2007   CARCINOMA, SKIN, SQUAMOUS CELL 01/08/2007   Generalized anxiety disorder 01/08/2007   ALZHEIMER'S DISEASE, MILD 01/08/2007   Essential hypertension 01/08/2007   Rico Junker, PT, DPT 06/04/21    12:59 PM  [facility] [address] [place], [place], [address] Phone: [phone]   Fax:  [phone]  Name: [name] MRN: [id number] Date of Birth: [birth year]

## 2021-06-07 ENCOUNTER — Encounter: Payer: Self-pay | Admitting: Dermatology

## 2021-06-07 NOTE — Progress Notes (Signed)
° °  Isotretinoin Follow-Up Visit   Subjective  [name] is a 88 y.o. male who presents for the following: Follow-up (F/u for sccis left parotid area, right eyebrow, and left forehead).  Follow-up for skin cancers with 2 facial crusts Location:  Duration:  Quality:  Associated Signs/Symptoms: Modifying Factors:  Severity:  Timing: Context:   The following portions of the chart were reviewed this encounter and updated as appropriate:  Tobacco   Allergies   Meds   Problems   Med Hx   Surg Hx   Fam Hx         Objective  Well appearing patient in no apparent distress; mood and affect are within normal limits.  A focused examination was performed including head and neck. Relevant physical exam findings are noted in the Assessment and Plan.  Right Buccal Cheek, Right Forehead Multiple small gritty crusts to somewhat larger lesions       Head - Anterior (Face) Some residual scale which could represent actinic keratosis or carcinoma in situ   Assessment & Plan  AK (actinic keratosis) (2) Right Buccal Cheek; Right Forehead  Because of other health issues patient would like to defer doing freezing on these.  Recheck in 6 months.  Personal history of skin cancer Head - Anterior (Face)  We will treat with freezing but if it grows or bleeds we will rebiopsy

## 2021-06-08 ENCOUNTER — Other Ambulatory Visit: Payer: Self-pay

## 2021-06-08 ENCOUNTER — Ambulatory Visit: Payer: Medicare HMO | Admitting: Occupational Therapy

## 2021-06-08 ENCOUNTER — Ambulatory Visit: Payer: Medicare HMO

## 2021-06-08 VITALS — BP 143/69 | HR 76

## 2021-06-08 DIAGNOSIS — R2681 Unsteadiness on feet: Secondary | ICD-10-CM

## 2021-06-08 DIAGNOSIS — R2689 Other abnormalities of gait and mobility: Secondary | ICD-10-CM

## 2021-06-08 DIAGNOSIS — I69354 Hemiplegia and hemiparesis following cerebral infarction affecting left non-dominant side: Secondary | ICD-10-CM | POA: Diagnosis not present

## 2021-06-08 DIAGNOSIS — M6281 Muscle weakness (generalized): Secondary | ICD-10-CM | POA: Diagnosis not present

## 2021-06-08 DIAGNOSIS — R41842 Visuospatial deficit: Secondary | ICD-10-CM | POA: Diagnosis not present

## 2021-06-08 DIAGNOSIS — R41840 Attention and concentration deficit: Secondary | ICD-10-CM | POA: Diagnosis not present

## 2021-06-08 DIAGNOSIS — R278 Other lack of coordination: Secondary | ICD-10-CM | POA: Diagnosis not present

## 2021-06-08 DIAGNOSIS — R262 Difficulty in walking, not elsewhere classified: Secondary | ICD-10-CM | POA: Diagnosis not present

## 2021-06-08 NOTE — Therapy (Signed)
OUTPATIENT PHYSICAL THERAPY TREATMENT NOTE   Patient Name: [name] MRN: [id number] DOB:Jan 27, 1931, 88 y.o., male Today's Date: 06/08/2021  PCP: [name], MD REFERRING PROVIDER: Cathlyn Parsons, PA-C   PT End of Session - 06/08/21 1149     Visit Number 12    Number of Visits 24    Date for PT Re-Evaluation 07/23/21    Authorization Type Aetna Medicare    Progress Note Due on Visit 19   progress note completed on 9th visit   PT Start Time 1147    PT Stop Time 1228    PT Time Calculation (min) 41 min    Activity Tolerance Treatment limited secondary to medical complications (Comment)    Behavior During Therapy [facility] for tasks assessed/performed             Past Medical History:  Diagnosis Date   Adenocarcinoma of left lung, stage 1 ([place]) 2001   T1N0 stage I  adenoca left lung resected 01/03/00    Alzheimer's disease ([place])    CAD (coronary artery disease) 2014   a. 09/2012 Cath: LM nl, LAD 50-60p, D1 60-70 m, D1 50ost, LCX nl, RCA min irregs, EF 55-65%.   Generalized anxiety disorder 01/08/2007   Qualifier: Diagnosis of  By: Diona Browner MD, [name]     History of colonic polyps 07/25/2011   Macular degeneration of both eyes 01/21/2014   Nonmelanoma skin cancer 07/25/2011   Multiple lesions excised face/nose   Pericarditis    a. 09/2012 with effusion and tamponade, s/p window.  b.  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 SCC IN SITU TX CX3   SCC (squamous cell carcinoma) 04/12/2005   RIGHT NECK SUP. SCC IN SITU TX EXC   SCC (squamous cell carcinoma) 04/16/2013   LEFT TEMPLE SCC IN SITU TX CX3 5FU   SCC (squamous cell carcinoma) 04/16/2013   BELOW LEFT EYE SCC IN SITU TX CX3 5FU   SCC (squamous cell carcinoma) 04/16/2013   BELOW RIGHT EYE SCC IN SITU TX CX3 5FU   SCC (squamous cell carcinoma) 08/04/2014   LEFT CHEEK SCC IN SITU TX CX3 5FU   SCC (squamous cell carcinoma) 11/27/2018   LEFT TEMPLE SCC IN SITU TX WITH BX   SCC (squamous cell carcinoma)    SCC (squamous cell carcinoma)    SCC (squamous cell carcinoma)    SCC (squamous cell carcinoma) Well Diff 09/13/2016   Tip of Nose SCC WELL DIFF TX (MOH's), and RIGHT INNER EYE SUP. SCC IN SITU TX TO WATCH   SCC (squamous cell carcinoma) Well Diff 05/30/2017   Right Cheekbone (Cx3,5FU) and Under Left Eye (Cx3,5FU)   Squamous cell carcinoma in situ (SCCIS) 04/12/2005   Right Neck Inf (  Cx3), Right Neck Sup (Exc), and Bulb of Nose (Cx3,Exc)   Squamous cell carcinoma in situ (SCCIS) 09/27/2005   Nose (Cx3,Exc)   Squamous cell carcinoma in situ (SCCIS) 02/18/2013   Below Left Eye (tx p bx)   Squamous cell carcinoma in situ (SCCIS) 04/16/2013   Left Temple (Cx3,5FU), Below Left Eye (Cx3,5FU), Below Right Eye (Cx3,5FU)   Squamous cell carcinoma in situ (SCCIS) 08/04/2014   Left Cheek (Cx3,5FU)   Squamous cell carcinoma in situ (SCCIS) 09/13/2016   Right Inner Eye Sup. 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   Type 2 diabetes mellitus with vascular disease ([place]) 05/21/2007   Qualifier: Diagnosis of   By: Diona Browner MD, [name]     Type 2 DM with CKD stage 3 and hypertension ([place]) 07/16/2015   Unspecified essential hypertension    Past Surgical History:  Procedure Laterality Date   HERNIA REPAIR     LEFT HEART CATHETERIZATION WITH CORONARY ANGIOGRAM N/A 09/13/2012   Procedure: LEFT HEART CATHETERIZATION WITH CORONARY ANGIOGRAM;  Surgeon: Sherren Mocha, MD;  Location: [facility] CATH LAB;  Service: Cardiovascular;  Laterality: N/A;   LOBECTOMY  2001   upper left   PERICARDIAL TAP N/A 09/16/2012   Procedure: PERICARDIAL TAP;  Surgeon: Sinclair Grooms, MD;  Location: [facility] CATH LAB;  Service: Cardiovascular;  Laterality: N/A;   PROSTATECTOMY  2003   SUBXYPHOID PERICARDIAL WINDOW N/A 09/16/2012   Procedure: SUBXYPHOID PERICARDIAL WINDOW;  Surgeon: Rexene Alberts, MD;  Location: [facility];  Service: Thoracic;  Laterality: N/A;   TONSILLECTOMY     Patient Active Problem List   Diagnosis Date Noted   Right pontine cerebrovascular accident ([place]) 03/12/2021   GERD (gastroesophageal reflux disease) 03/08/2021   Sensorineural hearing loss (SNHL) of both ears 05/15/2017   Macular degeneration of both eyes 01/21/2014   Tobacco abuse, 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/08/2013   CAD (coronary artery disease)    Pericardial tamponade 09/17/2012   Acute pericarditis, unspecified 09/13/2012   Adenocarcinoma of left lung, stage 1 ([place]) 07/25/2011   Cancer of prostate w/med recur risk (T2b-c or Gleason 7 or PSA 10-20) ([place]) 07/25/2011   Nonmelanoma skin cancer 07/25/2011   History of colonic polyps 07/25/2011   Type 2 diabetes mellitus with vascular disease ([place]) 05/21/2007   HYPERCHOLESTEROLEMIA 05/21/2007   CARCINOMA, SKIN, SQUAMOUS CELL 01/08/2007   Generalized anxiety disorder 01/08/2007   ALZHEIMER'S DISEASE, MILD 01/08/2007   Essential hypertension 01/08/2007    REFERRING DIAG: I63.50 (ICD-10-CM) - Cerebral infarction due to unspecified occlusion or stenosis of unspecified cerebral artery  THERAPY DIAG:  Hemiplegia and  hemiparesis following cerebral infarction affecting left non-dominant side (HCC)  Muscle weakness (generalized)  Unsteadiness on feet  Other abnormalities of gait and mobility  Difficulty in walking, not elsewhere classified  PERTINENT HISTORY: R pontine CVA 03/07/21. History of Alzheimer's dementia maintained on Aricept type 2 diabetes mellitus hypertension CKD stage III macular degeneration adenocarcinoma left lung with upper lobe resection 2001 prostate cancer with prostatectomy   PRECAUTIONS: Fall, HTN  SUBJECTIVE: Patient reports no new changes. No more nose bleeds per patient reports. No pain.   PAIN:  Are you having pain? No    TODAY'S  TREATMENT:  Therex:  Completed SciFit with BLE/BUE on Level 2.0 working on interval training, completed x 3 minutes on, with 1 minute rest break. Completed this x 3 rounds for improved activity tolerance. Pt tolerating increased time well. Vitals; HR: 85-90, Sp02: 96-99%. Cues for breathing to promote reduced SOB.    Lower Extremity Strengthening:   Bridge: BLE, Sets: 2, Reps: 10, Weight/Theraband: Red Theraband (completed with 2nd Set Only with gentle abduction  Clamshell: LLE, Sets: 2, Reps: 10, Weight/Theraband: Red Theraband Hamstring Curl: LLE; Position: seated , Sets: 2, Reps 10, Weight/Theraband: red theraband Completed alternating marching in hooklying with red theraband, completed bil x 10 reps with cues for eccentric control.     NMR:  Completed sit <> stand training with light UE support from mat, working on forward lean and standing upright without UE support, completed x 10 reps. Mild SOB noted at end. Vitals Stable.   GAIT: Gait pattern: decreased step length- Right, decreased step length- Left, decreased stance time- Left, decreased ankle dorsiflexion- Left, and poor foot clearance- Left Distance walked: clinic distances Assistive device utilized: Walker - 2 wheeled Level of assistance: CGA Comments: ambulation throughout  session, cues for upright posture    PATIENT EDUCATION: Education details: Continue HEP Person educated: Patient Education method: Explanation Education comprehension: verbalized understanding   HOME EXERCISE PROGRAM: Access Code: [email or web address]   PT Short Term Goals - 05/25/21 1240       PT SHORT TERM GOAL #1   Title Pt will report independence with progressive HEP and report completing >/= 3x/wk with supervision as needed (ALL STGs Due: 06/25/21)    Baseline reports completed 2-3x/weekly and independence; will benefit from progressive HEP    Time 4    Period Weeks    Status Revised    Target Date 06/25/21      PT SHORT TERM GOAL #2   Title Pt will have improved 5x STS to </=15 sec to demo improved balance    Baseline 18.75 secs on 2/21    Time 4    Period Weeks    Status Revised      PT SHORT TERM GOAL #3   Title Pt will be able to ambulate >/= 400 ft with RW without rest break to demo improved endurance and household mobility    Baseline 300 ft, 8/10 fatigue    Time 4    Period Weeks    Status Revised      PT SHORT TERM GOAL #4   Title Pt will have improved Berg Balance Score to at least 42/56    Baseline 36/56; 38/56; 39/56    Time 4    Period Weeks    Status Revised      PT SHORT TERM GOAL #5   Title Pt will demo improved TUG score to </=15 sec for decreased fall risk    Baseline 25.09 sec on eval; 16.59 secs with RW; 18.60 secs    Time 4    Period Weeks    Status Revised    Target Date --              PT Long Term Goals - 05/25/21 1244       PT LONG TERM GOAL #1   Title Pt will be independent with final and progressive HEP (All LTGs Due: 07/23/21)    Baseline HEP established, not completing frequently    Time 8    Period Weeks    Status New    Target Date 07/23/21  PT LONG TERM GOAL #2   Title Pt will be able to amb with LRAD mod I x 800' indoors for improved community mobility and activity tolerance    Baseline 300 ft with RW    Time 8     Period Weeks    Status Revised      PT LONG TERM GOAL #3   Title Pt will demo improved Berg Balance Score of at least 45/56 for decreased fall risk    Baseline 39/56    Time 8    Period Weeks    Status New      PT LONG TERM GOAL #4   Title Pt will improve gait speed to >/= 2.0 ft/sec to demo improved community mobility    Baseline 1.36 ft/sec    Time 8    Period Weeks    Status New      PT LONG TERM GOAL #5   Title Pt will have improved FOTO score of 65    Baseline 49    Time 8    Period Weeks    Status New    Target Date 06/09/21      Additional Long Term Goals   Additional Long Term Goals Yes      PT LONG TERM GOAL #6   Title Pt will improve TUG to </= 12 seconds with LRAD to demo reduced fall risk    Baseline 18.60 secs    Time 8    Period Weeks    Status New      PT LONG TERM GOAL #7   Title Pt will improve 5x sit <> stand to </= 12 seconds to demo improved balance    Baseline 18.75 secs    Time 8    Period Weeks    Status New              Plan - 06/08/21 1235     Clinical Impression Statement Continued endurance training on SciFit with patient tolerating increased time today, continue to experience SOB requiring intermittent rest breaks. Continued LLE strengthening and sit <> stand trianing iwth reduced UE support. Pt tolerating all activities well. Will continue per POC.    Personal Factors and Comorbidities Age;Fitness;Time since onset of injury/illness/exacerbation    Examination-Activity Limitations Locomotion Level;Bathing;Squat;Stairs;Stand;Toileting;Transfers;Bed Mobility;Caring for Others    Examination-Participation Restrictions Meal Prep;Cleaning;Community Activity;Laundry;Shop    Stability/Clinical Decision Making Evolving/Moderate complexity    Rehab Potential Good    PT Frequency 2x / week    PT Duration 8 weeks    PT Treatment/Interventions ADLs/Self Care Home Management;Aquatic Therapy;Electrical Stimulation;Moist Heat;DME Instruction;Gait  training;Stair training;Functional mobility training;Therapeutic activities;Therapeutic exercise;Balance training;Neuromuscular re-education;Patient/family education;Orthotic Fit/Training;Manual techniques;Passive range of motion;Dry needling;Taping    PT Next Visit Plan Continue to work on standing balance, gait training. LLE hip strengthening.    PT Home Exercise Plan Access Code [email or web address]    Consulted and Agree with Plan of Care Patient;Family member/caregiver               Jones Bales, PT, DPT 06/08/2021, 12:38 PM

## 2021-06-08 NOTE — Therapy (Signed)
[facility] [address] [place], [place], [address] Phone: [phone]   Fax:  [phone]  Occupational Therapy Treatment  Patient Details  Name: [name] MRN: [id number] Date of Birth: 1930/12/03 No data recorded  Encounter Date: 06/08/2021   OT End of Session - 06/08/21 1518     Visit Number 13    Number of Visits 25    Date for OT Re-Evaluation 07/08/21    Authorization Type Aetna Medicare    Authorization - Visit Number 13    Progress Note Due on Visit 20    OT Start Time 1233    OT Stop Time 1314    OT Time Calculation (min) 41 min    Activity Tolerance Patient tolerated treatment well    Behavior During Therapy [facility] for tasks assessed/performed             Past Medical History:  Diagnosis Date   Adenocarcinoma of left lung, stage 1 ([place]) 2001   T1N0 stage I  adenoca left lung resected 01/03/00    Alzheimer's disease ([place])    CAD (coronary artery disease) 2014   a. 09/2012 Cath: LM nl, LAD 50-60p, D1 60-70 m, D1 50ost, LCX nl, RCA min irregs, EF 55-65%.   Generalized anxiety disorder 01/08/2007   Qualifier: Diagnosis of  By: Diona Browner MD, [name]     History of colonic polyps 07/25/2011   Macular degeneration of both eyes 01/21/2014   Nonmelanoma skin cancer 07/25/2011   Multiple lesions excised face/nose   Pericarditis    a. 09/2012 with effusion and tamponade, s/p window.  b.  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 SCC IN SITU TX CX3   SCC (squamous cell carcinoma) 04/12/2005   RIGHT NECK SUP. SCC IN SITU TX EXC   SCC (squamous cell carcinoma) 04/16/2013   LEFT TEMPLE SCC IN SITU TX CX3 5FU   SCC (squamous cell carcinoma) 04/16/2013   BELOW LEFT EYE SCC IN SITU TX CX3 5FU   SCC (squamous cell carcinoma) 04/16/2013   BELOW RIGHT EYE SCC IN SITU TX CX3 5FU   SCC (squamous cell carcinoma) 08/04/2014   LEFT CHEEK SCC IN SITU TX CX3 5FU   SCC (squamous cell carcinoma) 11/27/2018   LEFT TEMPLE SCC IN SITU TX WITH BX   SCC (squamous cell carcinoma)    SCC (squamous cell carcinoma)    SCC (squamous cell carcinoma)    SCC (squamous cell carcinoma) Well Diff 09/13/2016   Tip of Nose SCC WELL DIFF TX (MOH's), and RIGHT INNER EYE SUP. SCC IN SITU TX TO WATCH   SCC (squamous cell carcinoma) Well Diff 05/30/2017   Right Cheekbone (Cx3,5FU) and Under Left Eye (Cx3,5FU)  Squamous cell carcinoma in situ (SCCIS) 04/12/2005   Right Neck Inf (Cx3), Right Neck Sup (Exc), and Bulb of Nose (Cx3,Exc)   Squamous cell carcinoma in situ (SCCIS) 09/27/2005   Nose (Cx3,Exc)   Squamous cell carcinoma in situ (SCCIS) 02/18/2013   Below Left Eye (tx p bx)   Squamous cell carcinoma in situ (SCCIS) 04/16/2013   Left Temple (Cx3,5FU), Below Left Eye (Cx3,5FU), Below Right Eye (Cx3,5FU)   Squamous cell carcinoma in situ (SCCIS) 08/04/2014   Left Cheek (Cx3,5FU)   Squamous cell carcinoma in situ (SCCIS) 09/13/2016   Right Inner Eye Sup. 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   Type 2 diabetes mellitus  with vascular disease ([place]) 05/21/2007   Qualifier: Diagnosis of  By: Diona Browner MD, [name]     Type 2 DM with CKD stage 3 and hypertension ([place]) 07/16/2015   Unspecified essential hypertension     Past Surgical History:  Procedure Laterality Date   HERNIA REPAIR     LEFT HEART CATHETERIZATION WITH CORONARY ANGIOGRAM N/A 09/13/2012   Procedure: LEFT HEART CATHETERIZATION WITH CORONARY ANGIOGRAM;  Surgeon: Sherren Mocha, MD;  Location: [facility] CATH LAB;  Service: Cardiovascular;  Laterality: N/A;   LOBECTOMY  2001   upper left   PERICARDIAL TAP N/A 09/16/2012   Procedure: PERICARDIAL TAP;  Surgeon: Sinclair Grooms, MD;  Location: [facility] CATH LAB;  Service: Cardiovascular;  Laterality: N/A;   PROSTATECTOMY  2003   SUBXYPHOID PERICARDIAL WINDOW N/A 09/16/2012   Procedure: SUBXYPHOID PERICARDIAL WINDOW;  Surgeon: Rexene Alberts, MD;  Location: [facility];  Service: Thoracic;  Laterality: N/A;   TONSILLECTOMY      There were no vitals filed for this visit.   Subjective Assessment - 06/08/21 1516     Subjective  no pain    Pertinent History Pt is an 88 year old man admitted on 03/07/21 with L side weakness. MRI + for small foci ischemia R pons and L frontal periventricular white matter. PMH. Alzheimers dementia, DM, HTN, stage 3 CKD, lung ca, prostate ca, multiple skin cancers, CAD, anxiety, macular degeneration. Pt lives with wife who has hired caregiver, children currently assisting    Limitations HOH, macular degeneration    Special Tests Goes by [name].    Patient Stated Goals get left  arm working, increase independence    Currently in Pain? No/denies              Treatment: Supine Closed chain shoulder flexion, chest press with foam roll, min v.c and facilitation Standing unnilateral  functional midrange shoulder flexion with UE ranger then circumduction, min v.c and facilitation Arm bike x 6 mins level 1 for conditioning. Flipping  then flicking playing cards to promote functional use and finger  extension, min v.c  Picking up coins to place in container min v.c for positioning.                     OT Short Term Goals - 06/04/21 1230       OT SHORT TERM GOAL #1   Title I with inital HEP.    Time 4    Period Weeks  Status Achieved    Target Date 05/13/21      OT SHORT TERM GOAL #2   Title Pt will donn shirt with set up and supervision and pants with min A.    Time 4    Period Weeks    Status Achieved      OT SHORT TERM GOAL #3   Title Pt will demonstrate 90* shoulder flexion in prep for functional reach with LUE with no more than min compensations.    Time 4    Period Weeks    Status On-going   76* with mod compensation     OT SHORT TERM GOAL #4   Title Pt will verbalize understanding of memory compensation strategies.    Time 4    Period Weeks    Status On-going   issued may benefit from reinforcement     OT SHORT TERM GOAL #5   Title Pt will demonstrate improved LUE functional use as evidenced by increasing box/ blocks score by 5 blocks    Baseline R 43 blocks, LUE 28 blocks    Time 4    Period Weeks    Status Achieved   34  blocks 05/25/21     OT SHORT TERM GOAL #6   Title Pt will verbalize understanding of adapted strategies to maximize safety and I with ADLs/ IADLs .    (CW:CBJSEGB food)    Time 4    Period Weeks    Status On-going   Pt practiced cutting food, min difficulty keeping LUE on fork, min v.c              OT Long Term Goals - 06/01/21 1250       OT LONG TERM GOAL #1   Title Pt will perform all basic ADLS with supervision.    Time 12    Period Weeks    Status On-going    Target Date 07/08/21      OT LONG TERM GOAL #2   Title Pt will demonstrate ability to retrieve a lightweight object at 100* with LUE    Time 12    Period Weeks    Status On-going      OT LONG TERM GOAL #3   Title Pt will demonstrate improved fine motor coordination for ADLs  as evidenced by placing 9 pegs in 90 secs or less for 9 hole peg  test.    Time 12    Period Weeks    Status On-going      OT LONG TERM GOAL #4   Title Pt will increase LUE grip strength by 5 lbs for increased LUE functional use.    Baseline RUE 65.9 LUE 33.7    Time 12    Period Weeks    Status On-going      OT LONG TERM GOAL #5   Title Pt will perform simple beverage prep with supervision.    Time 12    Period Weeks    Status On-going                   Plan - 06/08/21 1517     Clinical Impression Statement Pt demonstrates good overall progress towards ADLS and LUE functional use.    OT Occupational Profile and History Detailed Assessment- Review of Records and additional review of physical, cognitive, psychosocial history related to current functional performance    Occupational performance deficits (Please refer to evaluation for details): ADL's;IADL's;Rest and Sleep;Leisure;Social Participation    Body Structure /  Function / Physical Skills ADL;UE functional use;Endurance;Balance;Flexibility;Pain;Vision;FMC;Gait;ROM;GMC;Coordination;Decreased knowledge of precautions;Decreased knowledge of use of DME;IADL;Strength;Dexterity;Mobility    Rehab Potential Good    Clinical Decision Making Several treatment options, min-mod task modification necessary   visual and cognitive deficits, decrease coordiantion   Comorbidities Affecting Occupational Performance: May have comorbidities impacting occupational performance   Alzheimer's, macular degeneration, CVA   Modification or Assistance to Complete Evaluation  Min-Moderate modification of tasks or assist with assess necessary to complete eval   visual deficits, extra cueing and  assist required   OT Frequency 2x / week    OT Duration 12 weeks    OT Treatment/Interventions Self-care/ADL training;Ultrasound;Visual/perceptual remediation/compensation;Patient/family education;DME and/or AE instruction;Aquatic Therapy;Paraffin;Passive range of motion;Balance training;Gait Training;Stair  Training;Fluidtherapy;Cryotherapy;Electrical Stimulation;Therapist, nutritional;Therapeutic activities;Manual Therapy;Therapeutic exercise;Splinting;Moist Heat;Neuromuscular education;Cognitive remediation/compensation    Plan continue functional use of LUE, NMR, coordination as able.    Consulted and Agree with Plan of Care Patient             Patient will benefit from skilled therapeutic intervention in order to improve the following deficits and impairments:   Body Structure / Function / Physical Skills: ADL, UE functional use, Endurance, Balance, Flexibility, Pain, Vision, FMC, Gait, ROM, GMC, Coordination, Decreased knowledge of precautions, Decreased knowledge of use of DME, IADL, Strength, Dexterity, Mobility       Visit Diagnosis: Hemiplegia and hemiparesis following cerebral infarction affecting left non-dominant side (HCC)  Unsteadiness on feet  Muscle weakness (generalized)  Other abnormalities of gait and mobility  Other lack of coordination  Attention and concentration deficit    Problem List Patient Active Problem List   Diagnosis Date Noted   Right pontine cerebrovascular accident ([place]) 03/12/2021   GERD (gastroesophageal reflux disease) 03/08/2021   Sensorineural hearing loss (SNHL) of both ears 05/15/2017   Macular degeneration of both eyes 01/21/2014   Tobacco abuse, 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/08/2013   CAD (coronary artery disease)    Pericardial tamponade 09/17/2012   Acute pericarditis, unspecified 09/13/2012   Adenocarcinoma of left lung, stage 1 ([place]) 07/25/2011   Cancer of prostate w/med recur risk (T2b-c or Gleason 7 or PSA 10-20) ([place]) 07/25/2011   Nonmelanoma skin cancer 07/25/2011   History of colonic polyps 07/25/2011   Type 2 diabetes mellitus with vascular disease ([place]) 05/21/2007   HYPERCHOLESTEROLEMIA 05/21/2007   CARCINOMA, SKIN, SQUAMOUS CELL 01/08/2007   Generalized anxiety disorder 01/08/2007   ALZHEIMER'S DISEASE, MILD 01/08/2007    Essential hypertension 01/08/2007    Davetta Olliff, OT 06/08/2021, 3:21 PM  [facility] [address] [place], [place], [address] Phone: [phone]   Fax:  [phone]  Name: [name] MRN: [id number] Date of Birth: 1930-05-30

## 2021-06-11 ENCOUNTER — Ambulatory Visit: Payer: Medicare HMO | Admitting: Physical Therapy

## 2021-06-11 ENCOUNTER — Ambulatory Visit: Payer: Medicare HMO | Admitting: Occupational Therapy

## 2021-06-11 ENCOUNTER — Other Ambulatory Visit: Payer: Self-pay

## 2021-06-11 DIAGNOSIS — R262 Difficulty in walking, not elsewhere classified: Secondary | ICD-10-CM

## 2021-06-11 DIAGNOSIS — R2689 Other abnormalities of gait and mobility: Secondary | ICD-10-CM

## 2021-06-11 DIAGNOSIS — R2681 Unsteadiness on feet: Secondary | ICD-10-CM

## 2021-06-11 DIAGNOSIS — R41840 Attention and concentration deficit: Secondary | ICD-10-CM | POA: Diagnosis not present

## 2021-06-11 DIAGNOSIS — M6281 Muscle weakness (generalized): Secondary | ICD-10-CM

## 2021-06-11 DIAGNOSIS — I69354 Hemiplegia and hemiparesis following cerebral infarction affecting left non-dominant side: Secondary | ICD-10-CM

## 2021-06-11 DIAGNOSIS — R41842 Visuospatial deficit: Secondary | ICD-10-CM | POA: Diagnosis not present

## 2021-06-11 DIAGNOSIS — R278 Other lack of coordination: Secondary | ICD-10-CM | POA: Diagnosis not present

## 2021-06-11 NOTE — Therapy (Signed)
[facility] [address] [place], [place], [address] Phone: [phone]   Fax:  [phone]  Occupational Therapy Treatment  Patient Details  Name: [name] MRN: [id number] Date of Birth: 02-07-1931 No data recorded  Encounter Date: 06/11/2021   OT End of Session - 06/11/21 1235     Visit Number 14    Number of Visits 25    Date for OT Re-Evaluation 07/08/21    Authorization Type [employer]    Authorization - Visit Number 14    Progress Note Due on Visit 62    OT Start Time 1148    OT Stop Time 1227    OT Time Calculation (min) 39 min    Activity Tolerance Patient tolerated treatment well    Behavior During Therapy [facility] for tasks assessed/performed             Past Medical History:  Diagnosis Date   Adenocarcinoma of left lung, stage 1 ([place]) 2001   T1N0 stage I  adenoca left lung resected 01/03/00    Alzheimer's disease ([place])    CAD (coronary artery disease) 2014   a. 09/2012 Cath: LM nl, LAD 50-60p, D1 60-70 m, D1 50ost, LCX nl, RCA min irregs, EF 55-65%.   Generalized anxiety disorder 01/08/2007   Qualifier: Diagnosis of  By: Diona Browner MD, [name]     History of colonic polyps 07/25/2011   Macular degeneration of both eyes 01/21/2014   Nonmelanoma skin cancer 07/25/2011   Multiple lesions excised face/nose   Pericarditis    a. 09/2012 with effusion and tamponade, s/p window.  b.  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 SCC IN SITU TX CX3   SCC (squamous cell carcinoma) 04/12/2005   RIGHT NECK SUP. SCC IN SITU TX EXC   SCC (squamous cell carcinoma) 04/16/2013   LEFT TEMPLE SCC IN SITU TX CX3 5FU   SCC (squamous cell carcinoma) 04/16/2013   BELOW LEFT EYE SCC IN SITU TX CX3 5FU   SCC (squamous cell carcinoma) 04/16/2013   BELOW RIGHT EYE SCC IN SITU TX CX3 5FU   SCC (squamous cell carcinoma) 08/04/2014   LEFT CHEEK SCC IN SITU TX CX3 5FU   SCC (squamous cell carcinoma) 11/27/2018   LEFT TEMPLE SCC IN SITU TX WITH BX   SCC (squamous cell carcinoma)    SCC (squamous cell carcinoma)    SCC (squamous cell carcinoma)    SCC (squamous cell carcinoma) Well Diff 09/13/2016   Tip of Nose SCC WELL DIFF TX (MOH's), and RIGHT INNER EYE SUP. SCC IN SITU TX TO WATCH   SCC (squamous cell carcinoma) Well Diff 05/30/2017   Right Cheekbone (Cx3,5FU) and Under Left Eye (Cx3,5FU)  Squamous cell carcinoma in situ (SCCIS) 04/12/2005   Right Neck Inf (Cx3), Right Neck Sup (Exc), and Bulb of Nose (Cx3,Exc)   Squamous cell carcinoma in situ (SCCIS) 09/27/2005   Nose (Cx3,Exc)   Squamous cell carcinoma in situ (SCCIS) 02/18/2013   Below Left Eye (tx p bx)   Squamous cell carcinoma in situ (SCCIS) 04/16/2013   Left Temple (Cx3,5FU), Below Left Eye (Cx3,5FU), Below Right Eye (Cx3,5FU)   Squamous cell carcinoma in situ (SCCIS) 08/04/2014   Left Cheek (Cx3,5FU)   Squamous cell carcinoma in situ (SCCIS) 09/13/2016   Right Inner Eye Sup. 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   Type 2 diabetes mellitus  with vascular disease ([place]) 05/21/2007   Qualifier: Diagnosis of  By: Diona Browner MD, [name]     Type 2 DM with CKD stage 3 and hypertension ([place]) 07/16/2015   Unspecified essential hypertension     Past Surgical History:  Procedure Laterality Date   HERNIA REPAIR     LEFT HEART CATHETERIZATION WITH CORONARY ANGIOGRAM N/A 09/13/2012   Procedure: LEFT HEART CATHETERIZATION WITH CORONARY ANGIOGRAM;  Surgeon: Sherren Mocha, MD;  Location: [facility] CATH LAB;  Service: Cardiovascular;  Laterality: N/A;   LOBECTOMY  2001   upper left   PERICARDIAL TAP N/A 09/16/2012   Procedure: PERICARDIAL TAP;  Surgeon: Sinclair Grooms, MD;  Location: [facility] CATH LAB;  Service: Cardiovascular;  Laterality: N/A;   PROSTATECTOMY  2003   SUBXYPHOID PERICARDIAL WINDOW N/A 09/16/2012   Procedure: SUBXYPHOID PERICARDIAL WINDOW;  Surgeon: Rexene Alberts, MD;  Location: [facility];  Service: Thoracic;  Laterality: N/A;   TONSILLECTOMY      There were no vitals filed for this visit.   Pt denies pain   Treatment:Arm bike x 6 mins level 1 for reciprocal movement Standing to fold towels, wash cloths and pillow cases  with bilateral UE's min v.c and supervision UE ranger for mid range shoulder flexion, circumduction, min v.c  and facilitation REmoving and placing large pegs into pegboard with RUE for incr   Pt's blood sugar 269 at beginning of therapy, 236 at end of session                  OT Short Term Goals - 06/04/21 1230       OT SHORT TERM GOAL #1   Title I with inital HEP.    Time 4    Period Weeks    Status Achieved    Target Date 05/13/21      OT SHORT TERM GOAL #2   Title Pt will donn shirt with set up and supervision and pants with min A.    Time 4    Period Weeks    Status Achieved      OT SHORT TERM GOAL #3   Title Pt will demonstrate 90* shoulder flexion in prep for functional reach with LUE with no more than min compensations.    Time 4    Period Weeks    Status On-going   16* with mod  compensation     OT SHORT TERM GOAL #4   Title Pt will verbalize understanding of memory compensation strategies.  Time 4    Period Weeks    Status On-going   issued may benefit from reinforcement     OT SHORT TERM GOAL #5   Title Pt will demonstrate improved LUE functional use as evidenced by increasing box/ blocks score by 5 blocks    Baseline R 43 blocks, LUE 28 blocks    Time 4    Period Weeks    Status Achieved   34  blocks 05/25/21     OT SHORT TERM GOAL #6   Title Pt will verbalize understanding of adapted strategies to maximize safety and I with ADLs/ IADLs .    (BZ:JIRCVEL food)    Time 4    Period Weeks    Status On-going   Pt practiced cutting food, min difficulty keeping LUE on fork, min v.c              OT Long Term Goals - 06/01/21 1250       OT LONG TERM GOAL #1   Title Pt will perform all basic ADLS with supervision.    Time 12    Period Weeks    Status On-going    Target Date 07/08/21      OT LONG TERM GOAL #2   Title Pt will demonstrate ability to retrieve a lightweight object at 100* with LUE    Time 12    Period Weeks    Status On-going      OT LONG TERM GOAL #3   Title Pt will demonstrate improved fine motor coordination for ADLs  as evidenced by placing 9 pegs in 90 secs or less for 9 hole peg test.    Time 12    Period Weeks    Status On-going      OT LONG TERM GOAL #4   Title Pt will increase LUE grip strength by 5 lbs for increased LUE functional use.    Baseline RUE 65.9 LUE 33.7    Time 12    Period Weeks    Status On-going      OT LONG TERM GOAL #5   Title Pt will perform simple beverage prep with supervision.    Time 12    Period Weeks    Status On-going                   Plan - 06/11/21 1235     Clinical Impression Statement Pt demonstrates  progress with functional reach, and functional use of RUE.    OT Occupational Profile and History Detailed Assessment- Review of Records and additional review of physical,  cognitive, psychosocial history related to current functional performance    Occupational performance deficits (Please refer to evaluation for details): ADL's;IADL's;Rest and Sleep;Leisure;Social Participation    Body Structure / Function / Physical Skills ADL;UE functional use;Endurance;Balance;Flexibility;Pain;Vision;FMC;Gait;ROM;GMC;Coordination;Decreased knowledge of precautions;Decreased knowledge of use of DME;IADL;Strength;Dexterity;Mobility    Rehab Potential Good    Clinical Decision Making Several treatment options, min-mod task modification necessary   visual and cognitive deficits, decrease coordiantion   Comorbidities Affecting Occupational Performance: May have comorbidities impacting occupational performance   Alzheimer's, macular degeneration, CVA   Modification or Assistance to Complete Evaluation  Min-Moderate modification of tasks or assist with assess necessary to complete eval   visual deficits, extra cueing and  assist required   OT Frequency 2x / week    OT Duration 12 weeks    OT Treatment/Interventions Self-care/ADL training;Ultrasound;Visual/perceptual remediation/compensation;Patient/family education;DME and/or AE instruction;Aquatic Therapy;Paraffin;Passive range of motion;Balance training;Gait Training;Stair Training;Fluidtherapy;Cryotherapy;Electrical Stimulation;Functional  Mobility Training;Therapeutic activities;Manual Therapy;Therapeutic exercise;Splinting;Moist Heat;Neuromuscular education;Cognitive remediation/compensation    Plan continue functional use of LUE, NMR, coordination as able.    Consulted and Agree with Plan of Care Patient             Patient will benefit from skilled therapeutic intervention in order to improve the following deficits and impairments:   Body Structure / Function / Physical Skills: ADL, UE functional use, Endurance, Balance, Flexibility, Pain, Vision, FMC, Gait, ROM, GMC, Coordination, Decreased knowledge of precautions, Decreased  knowledge of use of DME, IADL, Strength, Dexterity, Mobility       Visit Diagnosis: Hemiplegia and hemiparesis following cerebral infarction affecting left non-dominant side (HCC)  Unsteadiness on feet  Muscle weakness (generalized)  Other lack of coordination  Attention and concentration deficit    Problem List Patient Active Problem List   Diagnosis Date Noted   Right pontine cerebrovascular accident ([place]) 03/12/2021   GERD (gastroesophageal reflux disease) 03/08/2021   Sensorineural hearing loss (SNHL) of both ears 05/15/2017   Macular degeneration of both eyes 01/21/2014   Tobacco abuse, 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/08/2013   CAD (coronary artery disease)    Pericardial tamponade 09/17/2012   Acute pericarditis, unspecified 09/13/2012   Adenocarcinoma of left lung, stage 1 ([place]) 07/25/2011   Cancer of prostate w/med recur risk (T2b-c or Gleason 7 or PSA 10-20) ([place]) 07/25/2011   Nonmelanoma skin cancer 07/25/2011   History of colonic polyps 07/25/2011   Type 2 diabetes mellitus with vascular disease ([place]) 05/21/2007   HYPERCHOLESTEROLEMIA 05/21/2007   CARCINOMA, SKIN, SQUAMOUS CELL 01/08/2007   Generalized anxiety disorder 01/08/2007   ALZHEIMER'S DISEASE, MILD 01/08/2007   Essential hypertension 01/08/2007    Caryl Fate, OT 06/11/2021, 12:36 PM Theone Murdoch, OTR/L [phone] Phone: [phone] 12:36 PM 06/11/21  [facility] [address] [place], [place], [address] Phone: [phone]   Fax:  [phone]  Name: SANTO ZAHRADNIK MRN: [id number] Date of Birth: 1930/04/24

## 2021-06-11 NOTE — Therapy (Signed)
OUTPATIENT PHYSICAL THERAPY TREATMENT NOTE   Patient Name: [name] MRN: [id number] DOB:Feb 26, 1931, 88 y.o., male Today's Date: 06/11/2021  PCP: [name], MD REFERRING PROVIDER: [name], MD   PT End of Session - 06/11/21 1326     Visit Number 13    Number of Visits 24    Date for PT Re-Evaluation 07/23/21    Authorization Type [employer]    Progress Note Due on Visit 19   progress note completed on 9th visit   PT Start Time 1235    PT Stop Time 1315    PT Time Calculation (min) 40 min    Activity Tolerance Treatment limited secondary to medical complications (Comment)    Behavior During Therapy [facility] for tasks assessed/performed             Past Medical History:  Diagnosis Date   Adenocarcinoma of left lung, stage 1 ([place]) 2001   T1N0 stage I  adenoca left lung resected 01/03/00    Alzheimer's disease ([place])    CAD (coronary artery disease) 2014   a. 09/2012 Cath: LM nl, LAD 50-60p, D1 60-70 m, D1 50ost, LCX nl, RCA min irregs, EF 55-65%.   Generalized anxiety disorder 01/08/2007   Qualifier: Diagnosis of  By: Diona Browner MD, [name]     History of colonic polyps 07/25/2011   Macular degeneration of both eyes 01/21/2014   Nonmelanoma skin cancer 07/25/2011   Multiple lesions excised face/nose   Pericarditis    a. 09/2012 with effusion and tamponade, s/p window.  b.  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 SCC IN SITU TX CX3   SCC (squamous cell carcinoma) 04/12/2005   RIGHT NECK SUP. SCC IN SITU TX EXC   SCC (squamous cell carcinoma) 04/16/2013   LEFT TEMPLE SCC IN SITU TX CX3 5FU   SCC (squamous cell carcinoma) 04/16/2013   BELOW LEFT EYE SCC IN SITU TX CX3 5FU   SCC (squamous cell carcinoma) 04/16/2013   BELOW RIGHT EYE SCC IN SITU TX CX3 5FU   SCC (squamous cell carcinoma) 08/04/2014   LEFT CHEEK SCC IN SITU TX CX3 5FU   SCC (squamous cell carcinoma) 11/27/2018   LEFT TEMPLE SCC IN SITU TX WITH BX   SCC (squamous cell carcinoma)    SCC (squamous cell carcinoma)    SCC (squamous cell carcinoma)    SCC (squamous cell carcinoma) Well Diff 09/13/2016   Tip of Nose SCC WELL DIFF TX (MOH's), and RIGHT INNER EYE SUP. SCC IN SITU TX TO WATCH   SCC (squamous cell carcinoma) Well Diff 05/30/2017   Right Cheekbone (Cx3,5FU) and Under Left Eye (Cx3,5FU)   Squamous cell carcinoma in situ (SCCIS) 04/12/2005   Right Neck Inf (Cx3),  Right Neck Sup (Exc), and Bulb of Nose (Cx3,Exc)   Squamous cell carcinoma in situ (SCCIS) 09/27/2005   Nose (Cx3,Exc)   Squamous cell carcinoma in situ (SCCIS) 02/18/2013   Below Left Eye (tx p bx)   Squamous cell carcinoma in situ (SCCIS) 04/16/2013   Left Temple (Cx3,5FU), Below Left Eye (Cx3,5FU), Below Right Eye (Cx3,5FU)   Squamous cell carcinoma in situ (SCCIS) 08/04/2014   Left Cheek (Cx3,5FU)   Squamous cell carcinoma in situ (SCCIS) 09/13/2016   Right Inner Eye Sup. 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   Type 2 diabetes mellitus with vascular disease ([place]) 05/21/2007   Qualifier: Diagnosis of  By:  Diona Browner MD, [name]     Type 2 DM with CKD stage 3 and hypertension ([place]) 07/16/2015   Unspecified essential hypertension    Past Surgical History:  Procedure Laterality Date   HERNIA REPAIR     LEFT HEART CATHETERIZATION WITH CORONARY ANGIOGRAM N/A 09/13/2012   Procedure: LEFT HEART CATHETERIZATION WITH CORONARY ANGIOGRAM;  Surgeon: Sherren Mocha, MD;  Location: [facility] C[facility] LAB;  Service: Cardiovascular;  Laterality: N/A;   LOBECTOMY  2001   upper left   PERICARDIAL TAP N/A 09/16/2012   Procedure: PERICARDIAL TAP;  Surgeon: Sinclair Grooms, MD;  Location: [facility] CATH LAB;  Service: Cardiovascular;  Laterality: N/A;   PROSTATECTOMY  2003   SUBXYPHOID PERICARDIAL WINDOW N/A 09/16/2012   Procedure: SUBXYPHOID PERICARDIAL WINDOW;  Surgeon: Rexene Alberts, MD;  Location: [place];  Service: Thoracic;  Laterality: N/A;   TONSILLECTOMY     Patient Active Problem List   Diagnosis Date Noted   Right pontine cerebrovascular accident ([place]) 03/12/2021   GERD (gastroesophageal reflux disease) 03/08/2021   Sensorineural hearing loss (SNHL) of both ears 05/15/2017   Macular degeneration of both eyes 01/21/2014   Tobacco abuse, 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/08/2013   CAD (coronary artery disease)    Pericardial tamponade 09/17/2012   Acute pericarditis, unspecified 09/13/2012   Adenocarcinoma of left lung, stage 1 ([place]) 07/25/2011   Cancer of prostate w/med recur risk (T2b-c or Gleason 7 or PSA 10-20) ([place]) 07/25/2011   Nonmelanoma skin cancer 07/25/2011   History of colonic polyps 07/25/2011   Type 2 diabetes mellitus with vascular disease ([place]) 05/21/2007   HYPERCHOLESTEROLEMIA 05/21/2007   CARCINOMA, SKIN, SQUAMOUS CELL 01/08/2007   Generalized anxiety disorder 01/08/2007   ALZHEIMER'S DISEASE, MILD 01/08/2007   Essential hypertension 01/08/2007    REFERRING DIAG: I63.50 (ICD-10-CM) - Cerebral infarction due to unspecified occlusion or stenosis of unspecified cerebral artery  THERAPY DIAG:  Hemiplegia and  hemiparesis following cerebral infarction affecting left non-dominant side (HCC)  Unsteadiness on feet  Muscle weakness (generalized)  Other abnormalities of gait and mobility  Difficulty in walking, not elsewhere classified  PERTINENT HISTORY: R pontine CVA 03/07/21. History of Alzheimer's dementia maintained on Aricept type 2 diabetes mellitus hypertension CKD stage III macular degeneration adenocarcinoma left lung with upper lobe resection 2001 prostate cancer with prostatectomy   PRECAUTIONS: Fall, HTN  SUBJECTIVE: No issues to report, no further nose bleeds.  CBG a little high today but coming down after OT.  Pt reports taking a shower (seated) by himself  today.  Doesn't think he will feel secure walking around his house without the RW.  PAIN:  Are you having pain? No  TODAY'S TREATMENT:   06/11/2021: Performed balance in // bars with and without UE support.  Performed multiple sit > stand from chair or mat with use of UE to stand with supervision.  Supervision for gait with RW with intermittent cues for more upright trunk.     Balance Exercises - 06/11/21 1238       Balance Exercises: Standing   Step Ups Forward;Lateral;4 inch;UE support 2;Limitations    Step Ups Limitations using LLE to step up for each; 10 reps each    Retro Gait Upper extremity support;4 reps;Limitations    Retro Gait Limitations with UE support from // bars, completed retro walking working on weight shift and step length    Sidestepping 2 reps;Limitations    Sidestepping Limitations 2 reps to L and R each, no UE support on // bars; min-mod A for upright posture, weight shifting and stance phase control on LLE.    Marching Solid surface;Upper extremity assist 2;Dynamic;Forwards;Limitations    Marching Limitations in // bars, UE sliding forwards on bars, 4 laps    Other Standing Exercises Standing without UE support performing alternating R and L taps to 4" step.  Therapist provided min-mod A to maintain  upright posture and stabilization during L stance.  1 set x 10 reps, 2nd set 5 reps due to LE fatigue and knee instability LLE.  Changed block to L side and performed lateral taps to 4" step with LLE x 10 reps with PT providing cues to maintain upright posture and weight over RLE.  Changed to R lateral taps to step on R with min-mod A to maintain WB on LLE, hip and trunk extension, 10 reps.             06/08/2021: Therex:  Completed SciFit with BLE/BUE on Level 2.0 working on interval training, completed x 3 minutes on, with 1 minute rest break. Completed this x 3 rounds for improved activity tolerance. Pt tolerating increased time well. Vitals; HR: 85-90, Sp02: 96-99%. Cues for breathing to promote reduced SOB.    Lower Extremity Strengthening:   Bridge: BLE, Sets: 2, Reps: 10, Weight/Theraband: Red Theraband (completed with 2nd Set Only with gentle abduction  Clamshell: LLE, Sets: 2, Reps: 10, Weight/Theraband: Red Theraband Hamstring Curl: LLE; Position: seated , Sets: 2, Reps 10, Weight/Theraband: red theraband Completed alternating marching in hooklying with red theraband, completed bil x 10 reps with cues for eccentric control.     NMR:  Completed sit <> stand training with light UE support from mat, working on forward lean and standing upright without UE support, completed x 10 reps. Mild SOB noted at end. Vitals Stable.   GAIT: Gait pattern: decreased step length- Right, decreased step length- Left, decreased stance time- Left, decreased ankle dorsiflexion- Left, and poor foot clearance- Left Distance walked: clinic distances Assistive device utilized: Walker - 2 wheeled Level of assistance: CGA Comments: ambulation throughout session, cues for upright posture    PATIENT EDUCATION: Education details: No changes to HEP Person educated: Patient Education method: Explanation Education comprehension: verbalized understanding   HOME EXERCISE PROGRAM: Access Code: [email or web address]    PT Short Term Goals - 05/25/21 1240       PT SHORT TERM GOAL #1   Title Pt will report independence with progressive HEP and report completing >/= 3x/wk with supervision as needed (ALL STGs Due: 06/25/21)  Baseline reports completed 2-3x/weekly and independence; will benefit from progressive HEP    Time 4    Period Weeks    Status Revised    Target Date 06/25/21      PT SHORT TERM GOAL #2   Title Pt will have improved 5x STS to </=15 sec to demo improved balance    Baseline 18.75 secs on 2/21    Time 4    Period Weeks    Status Revised      PT SHORT TERM GOAL #3   Title Pt will be able to ambulate >/= 400 ft with RW without rest break to demo improved endurance and household mobility    Baseline 300 ft, 8/10 fatigue    Time 4    Period Weeks    Status Revised      PT SHORT TERM GOAL #4   Title Pt will have improved Berg Balance Score to at least 42/56    Baseline 36/56; 38/56; 39/56    Time 4    Period Weeks    Status Revised      PT SHORT TERM GOAL #5   Title Pt will demo improved TUG score to </=15 sec for decreased fall risk    Baseline 25.09 sec on eval; 16.59 secs with RW; 18.60 secs    Time 4    Period Weeks    Status Revised    Target Date --              PT Long Term Goals - 05/25/21 1244       PT LONG TERM GOAL #1   Title Pt will be independent with final and progressive HEP (All LTGs Due: 07/23/21)    Baseline HEP established, not completing frequently    Time 8    Period Weeks    Status New    Target Date 07/23/21      PT LONG TERM GOAL #2   Title Pt will be able to amb with LRAD mod I x 800' indoors for improved community mobility and activity tolerance    Baseline 300 ft with RW    Time 8    Period Weeks    Status Revised      PT LONG TERM GOAL #3   Title Pt will demo improved Berg Balance Score of at least 45/56 for decreased fall risk    Baseline 39/56    Time 8    Period Weeks    Status New      PT LONG TERM GOAL #4   Title Pt  will improve gait speed to >/= 2.0 ft/sec to demo improved community mobility    Baseline 1.36 ft/sec    Time 8    Period Weeks    Status New      PT LONG TERM GOAL #5   Title Pt will have improved FOTO score of 65    Baseline 49    Time 8    Period Weeks    Status New    Target Date 06/09/21      Additional Long Term Goals   Additional Long Term Goals Yes      PT LONG TERM GOAL #6   Title Pt will improve TUG to </= 12 seconds with LRAD to demo reduced fall risk    Baseline 18.60 secs    Time 8    Period Weeks    Status New      PT LONG TERM GOAL #7   Title  Pt will improve 5x sit <> stand to </= 12 seconds to demo improved balance    Baseline 18.75 secs    Time 8    Period Weeks    Status New              Plan - 06/08/21 1235     Clinical Impression Statement Continued to focus on functional LE strengthening with body weight exercises and dynamic standing balance and gait progressing to no UE or intermittent UE support.  Continues to demonstrate significant weakness in glutes.  Will continue to address and progress towards LTG.    Personal Factors and Comorbidities Age;Fitness;Time since onset of injury/illness/exacerbation    Examination-Activity Limitations Locomotion Level;Bathing;Squat;Stairs;Stand;Toileting;Transfers;Bed Mobility;Caring for Others    Examination-Participation Restrictions Meal Prep;Cleaning;Community Activity;Laundry;Shop    Stability/Clinical Decision Making Evolving/Moderate complexity    Rehab Potential Good    PT Frequency 2x / week    PT Duration 8 weeks    PT Treatment/Interventions ADLs/Self Care Home Management;Aquatic Therapy;Electrical Stimulation;Moist Heat;DME Instruction;Gait training;Stair training;Functional mobility training;Therapeutic activities;Therapeutic exercise;Balance training;Neuromuscular re-education;Patient/family education;Orthotic Fit/Training;Manual techniques;Passive range of motion;Dry needling;Taping    PT Next  Visit Plan Continue to work on standing balance without UE support, gait training. LLE hip strengthening especially glute med and glute max.    PT Home Exercise Plan Access Code [email or web address]    Consulted and Agree with Plan of Care Patient;Family member/caregiver            Rico Junker, PT, DPT 06/11/21    1:28 PM

## 2021-06-16 ENCOUNTER — Ambulatory Visit: Payer: Medicare HMO | Admitting: Occupational Therapy

## 2021-06-16 ENCOUNTER — Ambulatory Visit: Payer: Medicare HMO

## 2021-06-18 ENCOUNTER — Ambulatory Visit: Payer: Medicare HMO

## 2021-06-18 ENCOUNTER — Ambulatory Visit: Payer: Medicare HMO | Admitting: Occupational Therapy

## 2021-06-18 ENCOUNTER — Other Ambulatory Visit: Payer: Self-pay

## 2021-06-22 ENCOUNTER — Ambulatory Visit: Payer: Medicare HMO | Admitting: Physical Therapy

## 2021-06-22 ENCOUNTER — Ambulatory Visit: Payer: Medicare HMO | Admitting: Occupational Therapy

## 2021-06-24 DIAGNOSIS — E119 Type 2 diabetes mellitus without complications: Secondary | ICD-10-CM | POA: Diagnosis not present

## 2021-06-25 ENCOUNTER — Ambulatory Visit: Payer: Medicare HMO | Admitting: Physical Therapy

## 2021-06-25 ENCOUNTER — Ambulatory Visit: Payer: Medicare HMO | Admitting: Occupational Therapy

## 2021-06-29 ENCOUNTER — Encounter: Payer: Medicare HMO | Admitting: Occupational Therapy

## 2021-06-29 ENCOUNTER — Ambulatory Visit: Payer: Medicare HMO

## 2021-06-29 DIAGNOSIS — H353211 Exudative age-related macular degeneration, right eye, with active choroidal neovascularization: Secondary | ICD-10-CM | POA: Diagnosis not present

## 2021-06-29 DIAGNOSIS — H43813 Vitreous degeneration, bilateral: Secondary | ICD-10-CM | POA: Diagnosis not present

## 2021-06-29 DIAGNOSIS — H353223 Exudative age-related macular degeneration, left eye, with inactive scar: Secondary | ICD-10-CM | POA: Diagnosis not present

## 2021-07-01 ENCOUNTER — Encounter: Payer: Medicare HMO | Admitting: Occupational Therapy

## 2021-07-01 ENCOUNTER — Ambulatory Visit: Payer: Medicare HMO | Admitting: Physical Therapy

## 2021-07-19 ENCOUNTER — Ambulatory Visit (INDEPENDENT_AMBULATORY_CARE_PROVIDER_SITE_OTHER): Payer: Medicare HMO | Admitting: Family Medicine

## 2021-07-19 ENCOUNTER — Encounter: Payer: Self-pay | Admitting: Family Medicine

## 2021-07-19 VITALS — BP 110/60 | HR 79 | Temp 97.4°F | Ht 69.0 in | Wt 176.4 lb

## 2021-07-19 DIAGNOSIS — E1159 Type 2 diabetes mellitus with other circulatory complications: Secondary | ICD-10-CM | POA: Diagnosis not present

## 2021-07-19 DIAGNOSIS — D649 Anemia, unspecified: Secondary | ICD-10-CM | POA: Diagnosis not present

## 2021-07-19 DIAGNOSIS — N183 Chronic kidney disease, stage 3 unspecified: Secondary | ICD-10-CM

## 2021-07-19 DIAGNOSIS — R29898 Other symptoms and signs involving the musculoskeletal system: Secondary | ICD-10-CM | POA: Diagnosis not present

## 2021-07-19 DIAGNOSIS — R300 Dysuria: Secondary | ICD-10-CM | POA: Diagnosis not present

## 2021-07-19 DIAGNOSIS — E441 Mild protein-calorie malnutrition: Secondary | ICD-10-CM

## 2021-07-19 DIAGNOSIS — I69354 Hemiplegia and hemiparesis following cerebral infarction affecting left non-dominant side: Secondary | ICD-10-CM

## 2021-07-19 DIAGNOSIS — I6350 Cerebral infarction due to unspecified occlusion or stenosis of unspecified cerebral artery: Secondary | ICD-10-CM

## 2021-07-19 LAB — CBC WITH DIFFERENTIAL/PLATELET
Basophils Absolute: 0 10*3/uL (ref 0.0–0.1)
Basophils Relative: 0.3 % (ref 0.0–3.0)
Eosinophils Absolute: 0 10*3/uL (ref 0.0–0.7)
Eosinophils Relative: 0.6 % (ref 0.0–5.0)
HCT: 38 % — ABNORMAL LOW (ref 39.0–52.0)
Hemoglobin: 12.8 g/dL — ABNORMAL LOW (ref 13.0–17.0)
Lymphocytes Relative: 8.7 % — ABNORMAL LOW (ref 12.0–46.0)
Lymphs Abs: 0.6 10*3/uL — ABNORMAL LOW (ref 0.7–4.0)
MCHC: 33.7 g/dL (ref 30.0–36.0)
MCV: 99.1 fl (ref 78.0–100.0)
Monocytes Absolute: 1.2 10*3/uL — ABNORMAL HIGH (ref 0.1–1.0)
Monocytes Relative: 18.4 % — ABNORMAL HIGH (ref 3.0–12.0)
Neutro Abs: 4.6 10*3/uL (ref 1.4–7.7)
Neutrophils Relative %: 72 % (ref 43.0–77.0)
Platelets: 123 10*3/uL — ABNORMAL LOW (ref 150.0–400.0)
RBC: 3.83 Mil/uL — ABNORMAL LOW (ref 4.22–5.81)
RDW: 13.7 % (ref 11.5–15.5)
WBC: 6.5 10*3/uL (ref 4.0–10.5)

## 2021-07-19 LAB — HEPATIC FUNCTION PANEL
ALT: 11 U/L (ref 0–53)
AST: 9 U/L (ref 0–37)
Albumin: 4.1 g/dL (ref 3.5–5.2)
Alkaline Phosphatase: 86 U/L (ref 39–117)
Bilirubin, Direct: 0.1 mg/dL (ref 0.0–0.3)
Total Bilirubin: 0.4 mg/dL (ref 0.2–1.2)
Total Protein: 6.6 g/dL (ref 6.0–8.3)

## 2021-07-19 LAB — BASIC METABOLIC PANEL
BUN: 29 mg/dL — ABNORMAL HIGH (ref 6–23)
CO2: 27 mEq/L (ref 19–32)
Calcium: 9.2 mg/dL (ref 8.4–10.5)
Chloride: 102 mEq/L (ref 96–112)
Creatinine, Ser: 1.69 mg/dL — ABNORMAL HIGH (ref 0.40–1.50)
GFR: 35.74 mL/min — ABNORMAL LOW (ref >60.00)
Glucose, Bld: 310 mg/dL — ABNORMAL HIGH (ref 70–99)
Potassium: 4.7 mEq/L (ref 3.5–5.1)
Sodium: 137 mEq/L (ref 135–145)

## 2021-07-19 MED ORDER — SERTRALINE HCL 100 MG PO TABS: 200.0000 mg | ORAL_TABLET | Freq: Every day | ORAL | 1 refills | Status: DC

## 2021-07-19 NOTE — Progress Notes (Signed)
? ? ?Ethan Walder T. Ardith Lewman, MD, [facility] ?Therapist, music at [facility] ?[address] ?[place], [address] ? ?Phone: [phone]  FAX: [phone] ? ?[name] - 88 y.o. male  MRN [id number]  Date of Birth: [birth year] ? ?Date: 07/19/2021  PCP: [name], MD  Referral: [name], MD ? ?Chief Complaint  ?Patient presents with  ? Follow-up  ?  6 week  ? ? ?This visit occurred during the SARS-CoV-2 public health emergency.  Safety protocols were in place, including screening questions prior to the visit, additional usage of staff PPE, and extensive cleaning of exam room while observing appropriate contact time as indicated for disinfecting solutions.  ? ?Subjective:  ? ?[name] is a 88 y.o. very pleasant male patient with Body mass index is 26.05 kg/m?. who presents with the following: ? ?F/u anxiety depression ?- sleeping more, depression is a little bit better.  It is still present, and he thinks that it is worse compared to baseline prior to stroke.  He is not having any SI or HI, but he also thinks his anxiety is gotten worse. ? ?Very serious prior stroke, and he has been making good progress, but later in the history, he and his wife noted that he did have an acute event several weeks ago where they think he distinctly got worse.  This did affect the left side, similar to his prior stroke.  Since then, he seems to be doing worse with more problems, weakness on the left side relatively diffusely. ? ?Not doing therapy on his own. ? ?Does not want to eat much, chipped ice only. ? ?He is having some issues with urination, and his wife and he wondered if he could have a urinary tract infection.  Some burning.  He is currently taking Flomax, but he did stop it.  Previously, he did think that this helped some. ? ?He is also been constipated, and he is using Metamucil and a stool softener, but his still having some issues with some firm bowel movements.  He is  going about once every other day. ? ?He is walking better compared to when I saw him initially after his stroke, he has no issues with falling.  He is still wearing his AFO on his left foot and ankle.  This has not really changed compared to previously. ? ?He does note and his wife notes that his left hand as well as his left hip appear to be weaker. ? ?At baseline he does have some cognitive impairment with Alzheimer's, so difficult to know if this has changed.  Wife not sure. ? ? ?Review of Systems is noted in the HPI, as appropriate ? ?Patient Active Problem List  ? Diagnosis Date Noted  ? Right pontine cerebrovascular accident [facility]) 03/12/2021  ?  Priority: High  ? CAD (coronary artery disease)   ?  Priority: High  ? Adenocarcinoma of left lung, stage 1 ([place]) 07/25/2011  ?  Priority: High  ? Cancer of prostate w/med recur risk (T2b-c or Gleason 7 or PSA 10-20) ([place]) 07/25/2011  ?  Priority: High  ? Type 2 diabetes mellitus with vascular disease ([place]) 05/21/2007  ?  Priority: High  ? Pericardial tamponade 09/17/2012  ?  Priority: Medium   ? Acute pericarditis, unspecified 09/13/2012  ?  Priority: Medium   ? HYPERCHOLESTEROLEMIA 05/21/2007  ?  Priority: Medium   ? ALZHEIMER'S DISEASE, MILD 01/08/2007  ?  Priority: Medium   ? Essential hypertension  01/08/2007  ?  Priority: Medium   ? Generalized anxiety disorder 01/08/2007  ?  Priority: Low  ? Nonmelanoma skin cancer 07/25/2011  ?  Priority: 13.  ? GERD (gastroesophageal reflux disease) 03/08/2021  ? Sensorineural hearing loss (SNHL) of both ears 05/15/2017  ? Macular degeneration of both eyes 01/21/2014  ? 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 History of colonic polyps 07/25/2011  ? CARCINOMA, SKIN, SQUAMOUS CELL 01/08/2007  ? ? ?Past Medical History:  ?Diagnosis Date  ? Adenocarcinoma of left lung, stage 1 ([place]) 2001  ? T1N0 stage I  adenoca left lung resected 01/03/00   ? Alzheimer's disease ([place])   ? CAD (coronary artery disease) 2014  ? a. 09/2012 Cath: LM  nl, LAD 50-60p, D1 60-70 m, D1 50ost, LCX nl, RCA min irregs, EF 55-65%.  ? Generalized anxiety disorder 01/08/2007  ? Qualifier: Diagnosis of  By: Diona Browner MD, [name]    ? History of colonic polyps 07/25/2011  ? Macular degeneration of both eyes 01/21/2014  ? Nonmelanoma skin cancer 07/25/2011  ? Multiple lesions excised face/nose  ? Pericarditis   ? a. 09/2012 with effusion and tamponade, s/p window.  b.  F/u Echo 09/19/12: mod LVH, EF 55%, Gr 1 DD, Tr MR, mild RVE, no residual effusion  ? Prostate CA ([place]) 04/2001  ? Gleason 7  S/P prostatectomy 04/11/01  ? 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 BULB OF NOSE SCC IN SITU TX CX3 5FU, EXC  ? SCC (squamous cell carcinoma) 02/18/2013  ? BELOW LEFT EYE SCC IN SITU TX WITH BX  ? SCC (squamous cell carcinoma) 08/27/2008  ? RIGHT OUTER BROW FOCAL IN SITU TX WITH BX  ? SCC (squamous cell carcinoma) 08/27/2008  ? BELOW LEFT EYE FOCAL IN SITU TX CX3 5FU  ? SCC (squamous cell carcinoma) 10/25/2006  ? RIGHT ELBOW SCC IN SITU TX WITH BX CX3 5FU  ? SCC (squamous cell carcinoma) 04/12/2005  ? RIGHT NECK INF. SCC IN SITU TX CX3  ? SCC (squamous cell carcinoma) 04/12/2005  ? RIGHT NECK SUP. SCC IN SITU TX EXC  ? SCC (squamous cell carcinoma) 04/16/2013  ? LEFT TEMPLE SCC IN SITU TX CX3 5FU  ? SCC (squamous cell carcinoma) 04/16/2013  ? BELOW LEFT EYE SCC IN SITU TX CX3 5FU  ? SCC (squamous cell carcinoma) 04/16/2013  ? BELOW RIGHT EYE SCC IN SITU TX CX3 5FU  ? SCC (squamous cell carcinoma) 08/04/2014  ? LEFT CHEEK SCC IN SITU TX CX3 5FU  ? SCC (squamous cell carcinoma) 11/27/2018  ? LEFT TEMPLE SCC IN SITU TX WITH BX  ? SCC (squamous cell carcinoma)   ? SCC (squamous cell carcinoma)   ? SCC (squamous cell carcinoma)   ? SCC (squamous cell carcinoma) Well Diff 09/13/2016  ? Tip of Nose SCC WELL DIFF TX (MOH's), and RIGHT INNER EYE SUP. SCC IN SITU TX TO WATCH  ? SCC (squamous cell carcinoma) Well Diff 05/30/2017  ? Right Cheekbone (Cx3,5FU) and Under Left Eye  (Cx3,5FU)  ? 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 Right Neck Inf (Cx3), Right Neck Sup (Exc), and Bulb of Nose (Cx3,Exc)  ? Squamous cell carcinoma in situ (SCCIS) 09/27/2005  ? Nose (Cx3,Exc)  ? Squamous cell carcinoma in situ (SCCIS) 02/18/2013  ? Below Left Eye (tx p bx)  ? Squamous cell carcinoma in situ (SCCIS) 04/16/2013  ? Left Temple (Cx3,5FU), Below Left Eye (Cx3,5FU), Below Right Eye (Cx3,5FU)  ?  Squamous cell carcinoma in situ (SCCIS) 08/04/2014  ? Left Cheek (Cx3,5FU)  ? Squamous cell carcinoma in situ (SCCIS) 09/13/2016  ? Right Inner Eye Sup. (Watch)  ? Squamous cell carcinoma in situ (SCCIS) Focal 08/24/2008  ? Right Outer Brow (tx p bx) and Below Left Eye (Cx3,5FU)  ? 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 Right Elbow (Cx3,5FU)  ? Squamous cell carcinoma of skin 09/27/2005  ? NOSE SCC IN SITU TC CX3 5FU  ? 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 Type 2 diabetes mellitus with vascular disease ([place]) 05/21/2007  ? Qualifier: Diagnosis of  By: Diona Browner MD, [name]    ? Type 2 DM with CKD stage 3 and hypertension ([place]) 07/16/2015  ? Unspecified essential hypertension   ? ? ?Past Surgical History:  ?Procedure Laterality Date  ? HERNIA REPAIR    ? LEFT HEART CATHETERIZATION WITH CORONARY ANGIOGRAM N/A 09/13/2012  ? Procedure: LEFT HEART CATHETERIZATION WITH CORONARY ANGIOGRAM;  Surgeon: Sherren Mocha, MD;  Location[facility] CATH LAB;  Service: Cardiovascular;  Laterality: N/A;  ? LOBECTOMY  2001  ? upper left  ? PERICARDIAL TAP N/A 09/16/2012  ? Procedure: PERICARDIAL TAP;  Surgeon: Sinclair Grooms, MD;  Location: [facility] CATH LAB;  Service: Cardiovascular;  Laterality: N/A;  ? PROSTATECTOMY  2003  ? SUBXYPHOID PERICARDIAL WINDOW N/A 09/16/2012  ? Procedure: SUBXYPHOID PERICARDIAL WINDOW;  Surgeon: Rexene Alberts, MD;  Location: [place];  Service: Thoracic;  Laterality: N/A;  ? TONSILLECTOMY    ? ? ?Family History  ?Problem Relation Age of Onset  ? Cancer Father   ?     colon  ?  Dementia Mother   ? Diabetes Mother   ? Cancer Brother   ?     lung  ?  ? ?Objective:  ? ?BP 110/60   Pulse 79   Temp (!) 97.4 ?F (36.3 ?C) (Oral)   Ht 5\' 9"  (1.753 m)   Wt 176 lb 6 oz (80 kg)   SpO2 96%   BMI 26.0

## 2021-07-20 LAB — URINE CULTURE
MICRO NUMBER:: 13272571
SPECIMEN QUALITY:: ADEQUATE

## 2021-07-31 ENCOUNTER — Ambulatory Visit
Admission: RE | Admit: 2021-07-31 | Discharge: 2021-07-31 | Disposition: A | Discharge status: Home / Self Care | Payer: Medicare HMO | Source: Ambulatory Visit | Attending: Family Medicine | Admitting: Family Medicine

## 2021-07-31 DIAGNOSIS — D181 Lymphangioma, any site: Secondary | ICD-10-CM | POA: Diagnosis not present

## 2021-07-31 DIAGNOSIS — R29818 Other symptoms and signs involving the nervous system: Secondary | ICD-10-CM | POA: Diagnosis not present

## 2021-07-31 DIAGNOSIS — R29898 Other symptoms and signs involving the musculoskeletal system: Secondary | ICD-10-CM

## 2021-07-31 DIAGNOSIS — I6350 Cerebral infarction due to unspecified occlusion or stenosis of unspecified cerebral artery: Secondary | ICD-10-CM

## 2021-08-10 ENCOUNTER — Ambulatory Visit (INDEPENDENT_AMBULATORY_CARE_PROVIDER_SITE_OTHER): Payer: Medicare HMO

## 2021-08-10 VITALS — Wt 176.0 lb

## 2021-08-10 DIAGNOSIS — Z0000 Encounter for general adult medical examination without abnormal findings: Secondary | ICD-10-CM | POA: Diagnosis not present

## 2021-08-10 NOTE — Patient Instructions (Signed)
Mr. Venard , ?Thank you for taking time to come for your Medicare Wellness Visit. I appreciate your ongoing commitment to your health goals. Please review the following plan we discussed and let me know if I can assist you in the future.  ? ?Screening recommendations/referrals: ?Colonoscopy: aged out ?Recommended yearly ophthalmology/optometry visit for glaucoma screening and checkup ?Recommended yearly dental visit for hygiene and checkup ? ?Vaccinations: ?Influenza vaccine: 03/13/21 ?Pneumococcal vaccine: 01/20/14 ?Tdap vaccine: 01/25/13 ?Shingles vaccine: n/d   ?Covid-19: 06/03/19, 07/04/19, 03/19/20 ? ?Advanced directives: no ? ?Conditions/risks identified: none ? ?Next appointment: Follow up in one year for your annual wellness visit. 08/12/22 @ 2:15pm by phone ? ?Preventive Care 86 Years and Older, Male ?Preventive care refers to lifestyle choices and visits with your health care provider that can promote health and wellness. ?What does preventive care include? ?A yearly physical exam. This is also called an annual well check. ?Dental exams once or twice a year. ?Routine eye exams. Ask your health care provider how often you should have your eyes checked. ?Personal lifestyle choices, including: ?Daily care of your teeth and gums. ?Regular physical activity. ?Eating a healthy diet. ?Avoiding tobacco and drug use. ?Limiting alcohol use. ?Practicing safe sex. ?Taking low doses of aspirin every day. ?Taking vitamin and mineral supplements as recommended by your health care provider. ?What happens during an annual well check? ?The services and screenings done by your health care provider during your annual well check will depend on your age, overall health, lifestyle risk factors, and family history of disease. ?Counseling  ?Your health care provider may ask you questions about your: ?Alcohol use. ?Tobacco use. ?Drug use. ?Emotional well-being. ?Home and relationship well-being. ?Sexual activity. ?Eating  habits. ?History of falls. ?Memory and ability to understand (cognition). ?Work and work Statistician. ?Screening  ?You may have the following tests or measurements: ?Height, weight, and BMI. ?Blood pressure. ?Lipid and cholesterol levels. These may be checked every 5 years, or more frequently if you are over 47 years old. ?Skin check. ?Lung cancer screening. You may have this screening every year starting at age 58 if you have a 30-pack-year history of smoking and currently smoke or have quit within the past 15 years. ?Fecal occult blood test (FOBT) of the stool. You may have this test every year starting at age 43. ?Flexible sigmoidoscopy or colonoscopy. You may have a sigmoidoscopy every 5 years or a colonoscopy every 10 years starting at age 34. ?Prostate cancer screening. Recommendations will vary depending on your family history and other risks. ?Hepatitis C blood test. ?Hepatitis B blood test. ?Sexually transmitted disease (STD) testing. ?Diabetes screening. This is done by checking your blood sugar (glucose) after you have not eaten for a while (fasting). You may have this done every 1-3 years. ?Abdominal aortic aneurysm (AAA) screening. You may need this if you are a current or former smoker. ?Osteoporosis. You may be screened starting at age 5 if you are at high risk. ?Talk with your health care provider about your test results, treatment options, and if necessary, the need for more tests. ?Vaccines  ?Your health care provider may recommend certain vaccines, such as: ?Influenza vaccine. This is recommended every year. ?Tetanus, diphtheria, and acellular pertussis (Tdap, Td) vaccine. You may need a Td booster every 10 years. ?Zoster vaccine. You may need this after age 90. ?Pneumococcal 13-valent conjugate (PCV13) vaccine. One dose is recommended after age 36. ?Pneumococcal polysaccharide (PPSV23) vaccine. One dose is recommended after age 27. ?Talk to your health care  provider about which screenings and  vaccines you need and how often you need them. ?This information is not intended to replace advice given to you by your health care provider. Make sure you discuss any questions you have with your health care provider. ?Document Released: 04/17/2015 Document Revised: 12/09/2015 Document Reviewed: 01/20/2015 ?Elsevier Interactive Patient Education ? 2017 [place]. ? ?Fall Prevention in the Home ?Falls can cause injuries. They can happen to people of all ages. There are many things you can do to make your home safe and to help prevent falls. ?What can I do on the outside of my home? ?Regularly fix the edges of walkways and driveways and fix any cracks. ?Remove anything that might make you trip as you walk through a door, such as a raised step or threshold. ?Trim any bushes or trees on the path to your home. ?Use bright outdoor lighting. ?Clear any walking paths of anything that might make someone trip, such as rocks or tools. ?Regularly check to see if handrails are loose or broken. Make sure that both sides of any steps have handrails. ?Any raised decks and porches should have guardrails on the edges. ?Have any leaves, snow, or ice cleared regularly. ?Use sand or salt on walking paths during winter. ?Clean up any spills in your garage right away. This includes oil or grease spills. ?What can I do in the bathroom? ?Use night lights. ?Install grab bars by the toilet and in the tub and shower. Do not use towel bars as grab bars. ?Use non-skid mats or decals in the tub or shower. ?If you need to sit down in the shower, use a plastic, non-slip stool. ?Keep the floor dry. Clean up any water that spills on the floor as soon as it happens. ?Remove soap buildup in the tub or shower regularly. ?Attach bath mats securely with double-sided non-slip rug tape. ?Do not have throw rugs and other things on the floor that can make you trip. ?What can I do in the bedroom? ?Use night lights. ?Make sure that you have a light by your  bed that is easy to reach. ?Do not use any sheets or blankets that are too big for your bed. They should not hang down onto the floor. ?Have a firm chair that has side arms. You can use this for support while you get dressed. ?Do not have throw rugs and other things on the floor that can make you trip. ?What can I do in the kitchen? ?Clean up any spills right away. ?Avoid walking on wet floors. ?Keep items that you use a lot in easy-to-reach places. ?If you need to reach something above you, use a strong step stool that has a grab bar. ?Keep electrical cords out of the way. ?Do not use floor polish or wax that makes floors slippery. If you must use wax, use non-skid floor wax. ?Do not have throw rugs and other things on the floor that can make you trip. ?What can I do with my stairs? ?Do not leave any items on the stairs. ?Make sure that there are handrails on both sides of the stairs and use them. Fix handrails that are broken or loose. Make sure that handrails are as long as the stairways. ?Check any carpeting to make sure that it is firmly attached to the stairs. Fix any carpet that is loose or worn. ?Avoid having throw rugs at the top or bottom of the stairs. If you do have throw rugs, attach them to the  floor with carpet tape. ?Make sure that you have a light switch at the top of the stairs and the bottom of the stairs. If you do not have them, ask someone to add them for you. ?What else can I do to help prevent falls? ?Wear shoes that: ?Do not have high heels. ?Have rubber bottoms. ?Are comfortable and fit you well. ?Are closed at the toe. Do not wear sandals. ?If you use a stepladder: ?Make sure that it is fully opened. Do not climb a closed stepladder. ?Make sure that both sides of the stepladder are locked into place. ?Ask someone to hold it for you, if possible. ?Clearly mark and make sure that you can see: ?Any grab bars or handrails. ?First and last steps. ?Where the edge of each step is. ?Use tools that  help you move around (mobility aids) if they are needed. These include: ?Canes. ?Walkers. ?Scooters. ?Crutches. ?Turn on the lights when you go into a dark area. Replace any light bulbs as soon as they burn out. ?S

## 2021-08-10 NOTE — Progress Notes (Signed)
?Virtual Visit via Telephone Note ? ?I connected with  [name] on 08/10/21 at  2:15 PM EDT by telephone and verified that I am speaking with the correct person using two identifiers. ? ?Location: ?Patient: home ?Provider: [facility] ?Persons participating in the virtual visit: patient/Nurse Health Advisor ?  ?I discussed the limitations, risks, security and privacy concerns of performing an evaluation and management service by telephone and the availability of in person appointments. The patient expressed understanding and agreed to proceed. ? ?Interactive audio and video telecommunications were attempted between this nurse and patient, however failed, due to patient having technical difficulties OR patient did not have access to video capability.  We continued and completed visit with audio only. ? ?Some vital signs may be absent or patient reported.  ? ?Dionisio David, LPN ? ?Subjective:  ? [name] is a 88 y.o. male who presents for Medicare Annual/Subsequent preventive examination. ? ?Review of Systems    ? ?  ? ?   ?Objective:  ?  ?There were no vitals filed for this visit. ?There is no height or weight on file to calculate BMI. ? ? ?  05/19/2021  ?  4:33 PM 04/14/2021  ?  2:04 PM 03/12/2021  ?  4:00 PM 03/09/2021  ? 10:29 AM 05/18/2020  ?  3:02 PM 09/03/2018  ? 10:51 AM 06/25/2018  ?  9:13 PM  ?Advanced Directives  ?Does Patient Have a Medical Advance Directive? Yes No No No No Yes No  ?Type of Paramedic of [place];Living will     [place];Living will   ?Does patient want to make changes to medical advance directive? No - Patient declined        ?Copy of [place] in Chart? No - copy requested     No - copy requested   ?Would patient like information on creating a medical advance directive?  No - Patient declined No - Patient declined No - Patient declined No - Patient declined  No - Patient declined  ? ? ?Current Medications  (verified) ?Outpatient Encounter Medications as of 08/10/2021  ?Medication Sig  ? aspirin EC 81 MG tablet Take 81 mg by mouth daily as needed for moderate pain.  ? atorvastatin (LIPITOR) 20 MG tablet Take 1 tablet (20 mg total) by mouth daily.  ? Continuous Blood Gluc Receiver (FREESTYLE LIBRE 2 READER) DEVI Use to check blood sugar continous  ? Continuous Blood Gluc Sensor (FREESTYLE LIBRE 2 SENSOR) MISC Use to check blood sugar continuous  ? donepezil (ARICEPT) 10 MG tablet TAKE 1 TABLET BY MOUTH EVERYDAY AT BEDTIME  ? ferrous sulfate 325 (65 FE) MG tablet Take 1 tablet (325 mg total) by mouth daily with breakfast.  ? glipiZIDE (GLUCOTROL) 10 MG tablet Take 1 tablet (10 mg total) by mouth 2 (two) times daily before a meal.  ? Multiple Vitamins-Minerals (ICAPS LUTEIN & ZEAXANTHIN PO) Take 1 capsule by mouth in the morning and at bedtime. I - Caps  ? Multiple Vitamins-Minerals (ICAPS) CAPS Take by mouth.  ? Omega-3 Fatty Acids (FISH OIL) 1000 MG CAPS Take 1 capsule (1,000 mg total) by mouth in the morning and at bedtime.  ? pantoprazole (PROTONIX) 40 MG tablet Take 1 tablet (40 mg total) by mouth daily.  ? Propylene Glycol (SYSTANE BALANCE) 0.6 % SOLN Apply 1 drop to eye 2 (two) times daily as needed (dry eyes).  ? sertraline (ZOLOFT) 100 MG tablet Take 2  tablets (200 mg total) by mouth daily.  ? tamsulosin (FLOMAX) 0.4 MG CAPS capsule Take 1 capsule (0.4 mg total) by mouth daily after supper.  ? triamcinolone cream (KENALOG) 0.1 % Apply 1 application topically 2 (two) times daily.  ? vitamin B-12 (CYANOCOBALAMIN) 1000 MCG tablet Take 1 tablet (1,000 mcg total) by mouth daily.  ? ?No facility-administered encounter medications on file as of 08/10/2021.  ? ? ?Allergies (verified) ?Sulfa antibiotics  ? ?History: ?Past Medical History:  ?Diagnosis Date  ? Adenocarcinoma of left lung, stage 1 ([place]) 2001  ? T1N0 stage I  adenoca left lung resected 01/03/00   ? Alzheimer's disease ([place])   ? CAD (coronary artery disease) 2014   ? a. 09/2012 Cath: LM nl, LAD 50-60p, D1 60-70 m, D1 50ost, LCX nl, RCA min irregs, EF 55-65%.  ? Generalized anxiety disorder 01/08/2007  ? Qualifier: Diagnosis of  By: Diona Browner MD, [name]    ? History of colonic polyps 07/25/2011  ? Macular degeneration of both eyes 01/21/2014  ? Nonmelanoma skin cancer 07/25/2011  ? Multiple lesions excised face/nose  ? Pericarditis   ? a. 09/2012 with effusion and tamponade, s/p window.  b.  F/u Echo 09/19/12: mod LVH, EF 55%, Gr 1 DD, Tr MR, mild RVE, no residual effusion  ? Prostate CA ([place]) 04/2001  ? Gleason 7  S/P prostatectomy 04/11/01  ? 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 BULB OF NOSE SCC IN SITU TX CX3 5FU, EXC  ? SCC (squamous cell carcinoma) 02/18/2013  ? BELOW LEFT EYE SCC IN SITU TX WITH BX  ? SCC (squamous cell carcinoma) 08/27/2008  ? RIGHT OUTER BROW FOCAL IN SITU TX WITH BX  ? SCC (squamous cell carcinoma) 08/27/2008  ? BELOW LEFT EYE FOCAL IN SITU TX CX3 5FU  ? SCC (squamous cell carcinoma) 10/25/2006  ? RIGHT ELBOW SCC IN SITU TX WITH BX CX3 5FU  ? SCC (squamous cell carcinoma) 04/12/2005  ? RIGHT NECK INF. SCC IN SITU TX CX3  ? SCC (squamous cell carcinoma) 04/12/2005  ? RIGHT NECK SUP. SCC IN SITU TX EXC  ? SCC (squamous cell carcinoma) 04/16/2013  ? LEFT TEMPLE SCC IN SITU TX CX3 5FU  ? SCC (squamous cell carcinoma) 04/16/2013  ? BELOW LEFT EYE SCC IN SITU TX CX3 5FU  ? SCC (squamous cell carcinoma) 04/16/2013  ? BELOW RIGHT EYE SCC IN SITU TX CX3 5FU  ? SCC (squamous cell carcinoma) 08/04/2014  ? LEFT CHEEK SCC IN SITU TX CX3 5FU  ? SCC (squamous cell carcinoma) 11/27/2018  ? LEFT TEMPLE SCC IN SITU TX WITH BX  ? SCC (squamous cell carcinoma)   ? SCC (squamous cell carcinoma)   ? SCC (squamous cell carcinoma)   ? SCC (squamous cell carcinoma) Well Diff 09/13/2016  ? Tip of Nose SCC WELL DIFF TX (MOH's), and RIGHT INNER EYE SUP. SCC IN SITU TX TO WATCH  ? SCC (squamous cell carcinoma) Well Diff 05/30/2017  ? Right Cheekbone (Cx3,5FU) and  Under Left Eye (Cx3,5FU)  ? 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 Right Neck Inf (Cx3), Right Neck Sup (Exc), and Bulb of Nose (Cx3,Exc)  ? Squamous cell carcinoma in situ (SCCIS) 09/27/2005  ? Nose (Cx3,Exc)  ? Squamous cell carcinoma in situ (SCCIS) 02/18/2013  ? Below Left Eye (tx p bx)  ? Squamous cell carcinoma in situ (SCCIS) 04/16/2013  ? Left [place] (Cx3,5FU), Below Left Eye (Cx3,5FU), Below Right Eye (Cx3,5FU)  ?  Squamous cell carcinoma in situ (SCCIS) 08/04/2014  ? Left Cheek (Cx3,5FU)  ? Squamous cell carcinoma in situ (SCCIS) 09/13/2016  ? Right Inner Eye Sup. (Watch)  ? Squamous cell carcinoma in situ (SCCIS) Focal 08/24/2008  ? Right Outer Brow (tx p bx) and Below Left Eye (Cx3,5FU)  ? 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 Right Elbow (Cx3,5FU)  ? Squamous cell carcinoma of skin 09/27/2005  ? NOSE SCC IN SITU TC CX3 5FU  ? 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 Qualifier: Diagnosis of  By: Diona Browner MD, [name]    ? Type 2 DM with CKD stage 3 and hypertension ([place]) 07/16/2015  ? Unspecified essential hypertension   ? ?Past Surgical History:  ?Procedure Laterality Date  ? HERNIA REPAIR    ? LEFT HEART CATHETERIZATION WITH CORONARY ANGIOGRAM N/A 09/13/2012  ? Procedure: LEFT HEART CATHETERIZATION WITH CORONARY ANGIOGRAM;  Surgeon: Sherren Mocha, MD;  [facility] C[facility] LAB;  Service: Cardiovascular;  Laterality: N/A;  ? LOBECTOMY  2001  ? upper left  ? PERICARDIAL TAP N/A 09/16/2012  ? Procedure: PERICARDIAL TAP;  Surgeon: Sinclair Grooms, MD;  Location: [facility] CATH LAB;  Service: Cardiovascular;  Laterality: N/A;  ? PROSTATECTOMY  2003  ? SUBXYPHOID PERICARDIAL WINDOW N/A 09/16/2012  ? Procedure: SUBXYPHOID PERICARDIAL WINDOW;  Surgeon: Rexene Alberts, MD;  Location: [facility];  Service: Thoracic;  Laterality: N/A;  ? TONSILLECTOMY    ? ?Family History  ?Problem Relation Age of Onset  ? Cancer Father   ?      colon  ? Dementia Mother   ? Diabetes Mother   ? Cancer Brother   ?     lung  ? ?Social History  ? ?Socioeconomic History  ? Marital status: Married  ?  Spouse name: Not on file  ? Number of children: No

## 2021-08-17 ENCOUNTER — Encounter: Payer: Medicare HMO | Attending: Physical Medicine & Rehabilitation | Admitting: Physical Medicine & Rehabilitation

## 2021-08-17 ENCOUNTER — Encounter: Payer: Self-pay | Admitting: Physical Medicine & Rehabilitation

## 2021-08-17 VITALS — BP 130/73 | HR 82 | Ht 69.0 in | Wt 178.0 lb

## 2021-08-17 DIAGNOSIS — I6350 Cerebral infarction due to unspecified occlusion or stenosis of unspecified cerebral artery: Secondary | ICD-10-CM | POA: Insufficient documentation

## 2021-08-17 NOTE — Patient Instructions (Signed)
Please keep up with home exercise program from PT and OT ?

## 2021-08-17 NOTE — Progress Notes (Signed)
? ?Subjective:  ? ? Patient ID: [name], male    DOB: Aug 19, 1930, 88 y.o.   MRN: [id number] ?88 y.o. 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  Per chart review lives with spouse as well as a live-in caregiver.  Presented 03/07/2021 left-sided weakness x2 to 3 days.  Cranial CT scan negative.  MRI identified small foci of acute ischemia within the ventral right pons and left frontal periventricular white matter.  No hemorrhage or mass-effect.  MRA with no intracranial large vessel occlusion.  Patient did not receive tPA.  Admission chemistries unremarkable except potassium 5.2 glucose 229 creatinine 1.50 hemoglobin A1c 8.3 urinalysis negative nitrite.  Echocardiogram with ejection fraction of 50 to 55% no wall motion abnormalities grade 1 diastolic dysfunction.  Neurology follow-up maintained on aspirin 81 mg daily and Plavix 75 mg daily for CVA prophylaxis x3 weeks and aspirin alone.  Lovenox for DVT prophylaxis.  Dysphagia #3 nectar thick liquid diet.  Therapy evaluations completed due to patient's left-sided weakness was admitted for a comprehensive rehab program. ?Admit date: 03/12/2021 ?Discharge date: 04/02/2021 ?HPI ? ?Last PCP visit Dr. Edilia Bo 07/19/2021 ? ?Function after inpt rehab 04/02/21 ?Sit to stand rolling walker contact-guard ambulated 150 feet rolling walker contact-guard wearing left lower extremity AFO. Stair negotiation ascending descending with some cues contact-guard  ?PT completed on 06/11/2021, was ambulating with a rolling walker contact-guard assistance at that time appreciable change compared to rehab discharge ?Now walking Mod I with walker ?Fell when getting into [facility] "a couple mo ago but not injury" WC ws not locked ?No longer uses WC ?Mod I dresing and bathing  ?Pain  Inventory ?Average Pain 0 ?Pain Right Now 0 ?My pain is  no pain ? ?LOCATION OF PAIN  no pain ? ?BOWEL ?Number of stools per week: 3 ?Oral laxative use Yes  ?Type of laxative stool softner ?Enema or suppository use No  ?History of colostomy No  ?Incontinent No  ? ?BLADDER ?Pads ?In and out cath, frequency . ?Able to self cath  . ?Bladder incontinence Yes  ?Frequent urination Yes  ?Leakage with coughing No  ?Difficulty starting stream No  ?Incomplete bladder emptying No  ? ? ?Mobility ?walk with assistance ?use a walker ?how many minutes can you walk? 20 ?ability to climb steps?  yes ?do you drive?  no ? ?Function ?retired ? ?Neuro/Psych ?trouble walking ?dizziness ?depression ?loss of taste or smell ? ?Prior Studies ?Any changes since last visit?  no ? ?Physicians involved in your care ?Any changes since last visit?  no ? ? ?Family History  ?Problem Relation Age of Onset  ? Cancer Father   ?     colon  ? Dementia Mother   ? Diabetes Mother   ? Cancer Brother   ?     lung  ? ?Social History  ? ?Socioeconomic History  ? Marital status: Married  ?  Spouse name: Not on file  ? Number of children: Not on file  ? Years of education: Not on file  ? Highest education level: Not on file  ?Occupational History  ? Occupation: retired  ?Tobacco Use  ? Smoking status: Former  ?  Packs/day: 2.00  ?  Years: 42.00  ?  Pack years: 84.00  ?  Types: Cigarettes  ?  Start date: 02/25/1953  ?  Quit  date: 04/04/1984  ?  Years since quitting: 37.3  ? Smokeless tobacco: Former  ?  Types: Chew  ?  Quit date: 07/27/1986  ? Tobacco comments:  ?  quit tobacco 26 years ago  ?Vaping Use  ? Vaping Use: Never used  ?Substance and Sexual Activity  ? Alcohol use: No  ?  Alcohol/week: 0.0 standard drinks  ? Drug use: No  ? Sexual activity: Not Currently  ?Other Topics Concern  ? Not on file  ?Social History Narrative  ? Lives in [place], [place]  ? Regular exercise--no, mowing grass  ? Diet: fruit and veggies  ? ?Social Determinants of Health   ? ?Financial Resource Strain: Low Risk   ? Difficulty of Paying Living Expenses: Not hard at all  ?Food Insecurity: No Food Insecurity  ? Worried About Charity fundraiser in the Last Year: Never true  ? Ran Out of Food in the Last Year: Never true  ?Transportation Needs: No Transportation Needs  ? Lack of Transportation (Medical): No  ? Lack of Transportation (Non-Medical): No  ?Physical Activity: Insufficiently Active  ? Days of Exercise per Week: 3 days  ? Minutes of Exercise per Session: 30 min  ?Stress: No Stress Concern Present  ? Feeling of Stress : Only a little  ?Social Connections: Moderately Isolated  ? Frequency of Communication with Friends and Family: Once a week  ? Frequency of Social Gatherings with Friends and Family: Twice a week  ? Attends Religious Services: Never  ? Active Member of Clubs or Organizations: No  ? Attends Archivist Meetings: Never  ? Marital Status: Married  ? ?Past Surgical History:  ?Procedure Laterality Date  ? HERNIA REPAIR    ? LEFT HEART CATHETERIZATION WITH CORONARY ANGIOGRAM N/A 09/13/2012  ? Procedure: LEFT HEART CATHETERIZATION WITH CORONARY ANGIOGRAM;  Surgeon: Sherren Mocha, MD;  Location: [facility] CATH LAB;  Service: Cardiovascular;  Laterality: N/A;  ? LOBECTOMY  2001  ? upper left  ? PERICARDIAL TAP N/A 09/16/2012  ? Procedure: PERICARDIAL TAP;  Surgeon: Sinclair Grooms, MD;  Location: [facility] CATH LAB;  Service: Cardiovascular;  Laterality: N/A;  ? PROSTATECTOMY  2003  ? SUBXYPHOID PERICARDIAL WINDOW N/A 09/16/2012  ? Procedure: SUBXYPHOID PERICARDIAL WINDOW;  Surgeon: Rexene Alberts, MD;  Location: [facility];  Service: Thoracic;  Laterality: N/A;  ? TONSILLECTOMY    ? ?Past Medical History:  ?Diagnosis Date  ? Adenocarcinoma of left lung, stage 1 ([place]) 2001  ? T1N0 stage I  adenoca left lung resected 01/03/00   ? Alzheimer's disease ([place])   ? CAD (coronary artery disease) 2014  ? a. 09/2012 Cath: LM nl, LAD 50-60p, D1 60-70 m, D1 50ost, LCX nl, RCA min irregs, EF  55-65%.  ? Generalized anxiety disorder 01/08/2007  ? Qualifier: Diagnosis of  By: Diona Browner MD, [name]    ? History of colonic polyps 07/25/2011  ? Macular degeneration of both eyes 01/21/2014  ? Nonmelanoma skin cancer 07/25/2011  ? Multiple lesions excised face/nose  ? Pericarditis   ? a. 09/2012 with effusion and tamponade, s/p window.  b.  F/u Echo 09/19/12: mod LVH, EF 55%, Gr 1 DD, Tr MR, mild RVE, no residual effusion  ? Prostate CA ([place]) 04/2001  ? Gleason 7  S/P prostatectomy 04/11/01  ? 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 BULB OF NOSE SCC IN SITU TX CX3 5FU, EXC  ? SCC (squamous cell carcinoma) 02/18/2013  ? BELOW LEFT EYE SCC  IN SITU TX WITH BX  ? SCC (squamous cell carcinoma) 08/27/2008  ? RIGHT OUTER BROW FOCAL IN SITU TX WITH BX  ? SCC (squamous cell carcinoma) 08/27/2008  ? BELOW LEFT EYE FOCAL IN SITU TX CX3 5FU  ? SCC (squamous cell carcinoma) 10/25/2006  ? RIGHT ELBOW SCC IN SITU TX WITH BX CX3 5FU  ? SCC (squamous cell carcinoma) 04/12/2005  ? RIGHT NECK INF. SCC IN SITU TX CX3  ? SCC (squamous cell carcinoma) 04/12/2005  ? RIGHT NECK SUP. SCC IN SITU TX EXC  ? SCC (squamous cell carcinoma) 04/16/2013  ? LEFT TEMPLE SCC IN SITU TX CX3 5FU  ? SCC (squamous cell carcinoma) 04/16/2013  ? BELOW LEFT EYE SCC IN SITU TX CX3 5FU  ? SCC (squamous cell carcinoma) 04/16/2013  ? BELOW RIGHT EYE SCC IN SITU TX CX3 5FU  ? SCC (squamous cell carcinoma) 08/04/2014  ? LEFT CHEEK SCC IN SITU TX CX3 5FU  ? SCC (squamous cell carcinoma) 11/27/2018  ? LEFT TEMPLE SCC IN SITU TX WITH BX  ? SCC (squamous cell carcinoma)   ? SCC (squamous cell carcinoma)   ? SCC (squamous cell carcinoma)   ? SCC (squamous cell carcinoma) Well Diff 09/13/2016  ? Tip of Nose SCC WELL DIFF TX (MOH's), and RIGHT INNER EYE SUP. SCC IN SITU TX TO WATCH  ? SCC (squamous cell carcinoma) Well Diff 05/30/2017  ? Right Cheekbone (Cx3,5FU) and Under Left Eye (Cx3,5FU)  ? 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 Right  Neck Inf (Cx3), Right Neck Sup (Exc), and Bulb of Nose (Cx3,Exc)  ? Squamous cell carcinoma in situ (SCCIS) 09/27/2005  ? Nose (Cx3,Exc)  ? Squamous cell carcinoma in situ (SCCIS) 02/18/2013  ? Below Left Eye (tx

## 2021-08-27 ENCOUNTER — Other Ambulatory Visit: Payer: Self-pay | Admitting: Family Medicine

## 2021-09-07 DIAGNOSIS — H353124 Nonexudative age-related macular degeneration, left eye, advanced atrophic with subfoveal involvement: Secondary | ICD-10-CM | POA: Diagnosis not present

## 2021-09-07 DIAGNOSIS — H43813 Vitreous degeneration, bilateral: Secondary | ICD-10-CM | POA: Diagnosis not present

## 2021-09-07 DIAGNOSIS — H31013 Macula scars of posterior pole (postinflammatory) (post-traumatic), bilateral: Secondary | ICD-10-CM | POA: Diagnosis not present

## 2021-09-07 DIAGNOSIS — H353211 Exudative age-related macular degeneration, right eye, with active choroidal neovascularization: Secondary | ICD-10-CM | POA: Diagnosis not present

## 2021-09-09 DIAGNOSIS — Z833 Family history of diabetes mellitus: Secondary | ICD-10-CM | POA: Diagnosis not present

## 2021-09-09 DIAGNOSIS — R69 Illness, unspecified: Secondary | ICD-10-CM | POA: Diagnosis not present

## 2021-09-09 DIAGNOSIS — N393 Stress incontinence (female) (male): Secondary | ICD-10-CM | POA: Diagnosis not present

## 2021-09-09 DIAGNOSIS — N400 Enlarged prostate without lower urinary tract symptoms: Secondary | ICD-10-CM | POA: Diagnosis not present

## 2021-09-09 DIAGNOSIS — Z809 Family history of malignant neoplasm, unspecified: Secondary | ICD-10-CM | POA: Diagnosis not present

## 2021-09-09 DIAGNOSIS — F411 Generalized anxiety disorder: Secondary | ICD-10-CM | POA: Diagnosis not present

## 2021-09-09 DIAGNOSIS — E119 Type 2 diabetes mellitus without complications: Secondary | ICD-10-CM | POA: Diagnosis not present

## 2021-09-09 DIAGNOSIS — E785 Hyperlipidemia, unspecified: Secondary | ICD-10-CM | POA: Diagnosis not present

## 2021-09-09 DIAGNOSIS — K219 Gastro-esophageal reflux disease without esophagitis: Secondary | ICD-10-CM | POA: Diagnosis not present

## 2021-09-09 DIAGNOSIS — Z7984 Long term (current) use of oral hypoglycemic drugs: Secondary | ICD-10-CM | POA: Diagnosis not present

## 2021-09-09 DIAGNOSIS — N529 Male erectile dysfunction, unspecified: Secondary | ICD-10-CM | POA: Diagnosis not present

## 2021-09-09 DIAGNOSIS — G3184 Mild cognitive impairment, so stated: Secondary | ICD-10-CM | POA: Diagnosis not present

## 2021-09-13 ENCOUNTER — Telehealth: Payer: Self-pay

## 2021-09-13 NOTE — Chronic Care Management (AMB) (Signed)
    Chronic Care Management Pharmacy Assistant   Name: [name]  MRN: [id number] DOB: 01-Apr-1931   Reason for Encounter: Reminder Call   Conditions to be addressed/monitored: CAD, HTN, and DMII   Recent office visits:  07/19/21-Ethan Copland,MD(PCP)f/u  anxiety and depression-Referral for Brain MRI, no mediction changes f/u 3 months 06/02/21-Ethan Copland,MD(PCP)f/u hospital visit,Diabetes is doing worse.  A1c is now 8.3.  Think that the most obvious thing to do here is increase his glipizide and then have close follow-up.Triamcinolone for a rash.f/u 6 weeks  Recent consult visits:  08/17/21-Ethan Kirsteins,MD(PM&R)-f/u right left hemiataxia,no medication changes, f/u as needed 06/29/21-[name](Ophthalmology)- no data found 05/27/21-Ethan Sethi,MD(neuro)-f/u hospital stay for stroke.Discussed diet and exercise, continue 81mg  asa,  f/u 6 months  Hospital visits:  05/19/21-Ethan Butler,MD( Ed) left side weakness,labs ordered(abnormal)EKG,xrays,no medication changes,  discharged to the home  Medications: Outpatient Encounter Medications as of 09/13/2021  Medication Sig   aspirin EC 81 MG tablet Take 81 mg by mouth daily as needed for moderate pain.   atorvastatin (LIPITOR) 20 MG tablet Take 1 tablet (20 mg total) by mouth daily.   Continuous Blood Gluc Receiver (FREESTYLE LIBRE 2 READER) DEVI Use to check blood sugar continous   Continuous Blood Gluc Castillo (FREESTYLE LIBRE 2 Castillo) MISC Use to check blood sugar continuous   donepezil (ARICEPT) 10 MG tablet TAKE 1 TABLET BY MOUTH EVERYDAY AT BEDTIME   ferrous sulfate 325 (65 FE) MG tablet Take 1 tablet (325 mg total) by mouth daily with breakfast.   glipiZIDE (GLUCOTROL) 10 MG tablet Take 1 tablet (10 mg total) by mouth 2 (two) times daily before a meal.   Multiple Vitamins-Minerals (ICAPS LUTEIN & ZEAXANTHIN PO) Take 1 capsule by mouth in the morning and at bedtime. I - Caps   Multiple Vitamins-Minerals  (ICAPS) CAPS Take by mouth.   Omega-3 Fatty Acids (FISH OIL) 1000 MG CAPS Take 1 capsule (1,000 mg total) by mouth in the morning and at bedtime.   pantoprazole (PROTONIX) 40 MG tablet Take 1 tablet (40 mg total) by mouth daily.   Propylene Glycol (SYSTANE BALANCE) 0.6 % SOLN Apply 1 drop to eye 2 (two) times daily as needed (dry eyes).   sertraline (ZOLOFT) 100 MG tablet Take 2 tablets (200 mg total) by mouth daily.   tamsulosin (FLOMAX) 0.4 MG CAPS capsule Take 1 capsule (0.4 mg total) by mouth daily after supper.   triamcinolone cream (KENALOG) 0.1 % Apply 1 application topically 2 (two) times daily.   vitamin B-12 (CYANOCOBALAMIN) 1000 MCG tablet Take 1 tablet (1,000 mcg total) by mouth daily.   No facility-administered encounter medications on file as of 09/13/2021.   [name] was contacted to remind of upcoming telephone visit with [name] on 09/16/21 at 11:00am. Patient was reminded to have any blood glucose and blood pressure readings available for review at appointment.  Wife [name] confirmed  Patient confirmed appointment. Wife [name] confirmed and will be speaking for patient    Are you having any problems with your medications? No   Do you have any concerns you like to discuss with the pharmacist? Yes  Wife of the patient feels his BG's are climbing up.   CCM referral has been placed prior to visit?  Yes    Star Rating Drugs: Medication:  Last Fill: Day Supply Atorvastatin 20mg  07/21/21 90 Glipizide 10mg  08/27/21 [name], [name] notified  [name], [name]  [phone]

## 2021-09-16 ENCOUNTER — Ambulatory Visit (INDEPENDENT_AMBULATORY_CARE_PROVIDER_SITE_OTHER): Payer: Medicare HMO | Admitting: Pharmacist

## 2021-09-16 ENCOUNTER — Telehealth: Payer: Self-pay | Admitting: Pharmacist

## 2021-09-16 DIAGNOSIS — E7800 Pure hypercholesterolemia, unspecified: Secondary | ICD-10-CM

## 2021-09-16 DIAGNOSIS — E1159 Type 2 diabetes mellitus with other circulatory complications: Secondary | ICD-10-CM

## 2021-09-16 DIAGNOSIS — F411 Generalized anxiety disorder: Secondary | ICD-10-CM

## 2021-09-16 DIAGNOSIS — N183 Chronic kidney disease, stage 3 unspecified: Secondary | ICD-10-CM

## 2021-09-16 DIAGNOSIS — Z8673 Personal history of transient ischemic attack (TIA), and cerebral infarction without residual deficits: Secondary | ICD-10-CM

## 2021-09-16 MED ORDER — OZEMPIC (0.25 OR 0.5 MG/DOSE) 2 MG/3ML ~~LOC~~ SOPN: PEN_INJECTOR | SUBCUTANEOUS | 1 refills | Status: DC

## 2021-09-16 NOTE — Progress Notes (Unsigned)
Chronic Care Management Pharmacy Note  09/20/2021 Name:  [name] MRN:  [id number] DOB:  January 20, 1931  Summary: CCM F/U visit -Reviewed medications; pt affirms compliance as prescribed -DM: A1c 8.3 (03/2021); goal A1c < 8%home BG 200-300s suggesting worsening control; discussed glipizide efficacy is likely waning, he agrees to try Ozempic -Hx CVA: LDL 83 (03/2021) prior to re-starting atorvastatin, goal LDL < 70 given recent CVA; lipids have not be re-checked yet -BP: home BP is elevated in 150s/60s, pt is checking only occasionally however  Recommendations/Changes made from today's visit: -Start Ozempic 0.25 mg weekly x 4 weeks, then increase to 0.5 mg weekly - consulting w/ PCP -Advised to monitor BP daily for 2 weeks - f/u call in 2 weeks to reassess -Recommend repeat lipid panel at next OV  Plan: -[name] will call patient 2 weeks for BP log -Pharmacist follow up televisit scheduled for 6 weeks -PCP 10/20/21; endocrine 09/29/21    Subjective: [name] is an 88 y.o. year old male who is a primary patient of Copland, Frederico Hamman, MD.  The CCM team was consulted for assistance with disease management and care coordination needs.    Engaged with patient by telephone for follow up visit in response to provider referral for pharmacy case management and/or care coordination services.   Consent to Services:  The patient was given information about Chronic Care Management services, agreed to services, and gave verbal consent prior to initiation of services.  Please see initial visit note for detailed documentation.   Patient Care Team: [name], MD as PCP - General (Family Medicine) Sherren Mocha, MD as PCP - Cardiology (Cardiology) Lavonna Monarch, MD as Consulting Physician (Dermatology) Charlton Haws, [facility] as Pharmacist (Pharmacist)  Recent office visits: 07/19/21 Dr Lorelei Pont OV: f/u - depression/anxiety worse since stroke. C/o dysuria,  constipation. Ordered MRI brain. Increased sertraline to 200 mg/day.  06/02/21 Dr Lorelei Pont OV: ER follow up (stroke sx) - improving. A1c 8.3%, increase glipizide to 10 mg BID. Increase sertraline to 150 mg. Rx triamcinolone cream for rash.  05/05/21 TE - ordered Freestyle Libre per pt request.  04/15/21 Dr Lorelei Pont OV: hospital f/u (Acute CVA) - increase glipizide 5 mg to BID. 04/12/21 pt message - referral to endocrine.  Recent consult visits: 05/27/21 Dr Leonie Man (neurology): new pt - stroke. Continue aspirin 81 mg. BP goal < 130/90. A1c goal < 6.5%. LDL goal < 70.   05/18/21 Dr Letta Pate (phys med): f/u stroke. No changes.  Hospital visits: 05/19/21 ED visit [facility] [place]): L sided weakness. CT scan wnl. Outpatient f/u.  03/12/21-04/02/21 inpatient rehab([facility]): acute CVA. 03/07/21-03/12/21 admission [facility]): acute CVA. No embolic source. Rec aspirin + Plavix for 3 weeks then aspirin alone.    Objective:  Lab Results  Component Value Date   CREATININE 1.69 (H) 07/19/2021   BUN 29 (H) 07/19/2021   GFR 35.74 (L) 07/19/2021   EGFR 44 (L) 07/30/2015   GFRNONAA 46 (L) 05/19/2021   GFRAA 58 (L) 06/25/2018   NA 137 07/19/2021   K 4.7 07/19/2021   CALCIUM 9.2 07/19/2021   CO2 27 07/19/2021   GLUCOSE 310 (H) 07/19/2021    Lab Results  Component Value Date/Time   HGBA1C 8.3 (H) 03/08/2021 05:53 AM   HGBA1C 7.3 (H) 05/11/2020 10:31 AM   GFR 35.74 (L) 07/19/2021 12:44 PM   GFR 40.03 (L) 04/15/2021 12:29 PM   MICROALBUR 7.8 (H) 12/20/2019 09:20 AM   MICROALBUR 3.4 (H) 10/31/2018 10:39 AM    Last diabetic  Eye exam:  Lab Results  Component Value Date/Time   HMDIABEYEEXA Retinopathy (A) 04/24/2019 12:00 AM    Last diabetic Foot exam:  Lab Results  Component Value Date/Time   HMDIABFOOTEX done 08/09/2012 12:00 AM     Lab Results  Component Value Date   CHOL 122 03/08/2021   HDL 17 (L) 03/08/2021   LDLCALC 83 03/08/2021   LDLDIRECT 130.0 12/20/2019   TRIG 109 03/08/2021   CHOLHDL 7.2 03/08/2021        Latest Ref Rng & Units 07/19/2021   12:44 PM 05/19/2021    2:44 PM 04/15/2021   12:29 PM  Hepatic Function  Total Protein 6.0 - 8.3 g/dL 6.6  6.1  6.7   Albumin 3.5 - 5.2 g/dL 4.1  3.6  4.0   AST 0 - 37 U/L '9  12  10   ' ALT 0 - 53 U/L '11  9  9   ' Alk Phosphatase 39 - 117 U/L 86  74  85   Total Bilirubin 0.2 - 1.2 mg/dL 0.4  0.4  0.4   Bilirubin, Direct 0.0 - 0.3 mg/dL 0.1   0.1     Lab Results  Component Value Date/Time   TSH 1.789 03/10/2021 12:57 AM   TSH 1.110 04/19/2011 01:29 PM       Latest Ref Rng & Units 07/19/2021   12:44 PM 05/19/2021    2:51 PM 05/19/2021    2:44 PM  CBC  WBC 4.0 - 10.5 K/uL 6.5   5.6   Hemoglobin 13.0 - 17.0 g/dL 12.8  12.9  12.4   Hematocrit 39.0 - 52.0 % 38.0  38.0  39.1   Platelets 150.0 - 400.0 K/uL 123.0   102    Iron/TIBC/Ferritin/ %Sat    Component Value Date/Time   IRON 77 03/10/2021 0057   TIBC 342 03/10/2021 0057   FERRITIN 42 03/10/2021 0057   IRONPCTSAT 23 03/10/2021 0057    No results found for: "VD25OH"  Clinical ASCVD: Yes  The ASCVD Risk score (Arnett DK, et al., 2019) failed to calculate for the following reasons:   The 2019 ASCVD risk score is only valid for ages 60 to 72   The patient has a prior MI or stroke diagnosis       08/17/2021    2:06 PM 08/10/2021    2:14 PM 05/18/2021    2:00 PM  Depression screen PHQ 2/9  Decreased Interest 0 0 0  Down, Depressed, Hopeless 1 0 0  PHQ - 2 Score 1 0 0     Social History   Tobacco Use  Smoking Status Former   Packs/day: 2.00   Years: 42.00   Total pack years: 84.00   Types: Cigarettes   Start date: 02/25/1953   Quit date: 04/04/1984   Years since quitting: 37.4  Smokeless Tobacco Former   Types: Chew   Quit date: 07/27/1986  Tobacco Comments   quit tobacco 26 years ago   BP Readings from Last 3 Encounters:  08/17/21 130/73  07/19/21 110/60  06/08/21 (!) 143/69   Pulse Readings from Last 3 Encounters:  08/17/21 82  07/19/21 79  06/08/21 76   Wt  Readings from Last 3 Encounters:  08/17/21 178 lb (80.7 kg)  08/10/21 176 lb (79.8 kg)  07/19/21 176 lb 6 oz (80 kg)   BMI Readings from Last 3 Encounters:  08/17/21 26.29 kg/m  08/10/21 25.99 kg/m  07/19/21 26.05 kg/m    Assessment/Interventions: Review of patient past medical history,  allergies, medications, health status, including review of consultants reports, laboratory and other test data, was performed as part of comprehensive evaluation and provision of chronic care management services.   SDOH:  (Social Determinants of Health) assessments and interventions performed: No - done May 2023  SDOH Screenings   Alcohol Screen: Low Risk  (08/10/2021)   Alcohol Screen    Last Alcohol Screening Score (AUDIT): 0  Depression (PHQ2-9): Low Risk  (08/17/2021)   Depression (PHQ2-9)    PHQ-2 Score: 1  Financial Resource Strain: Low Risk  (08/10/2021)   Overall Financial Resource Strain (CARDIA)    Difficulty of Paying Living Expenses: Not hard at all  Food Insecurity: No Food Insecurity (08/10/2021)   Hunger Vital Sign    Worried About Running Out of Food in the Last Year: Never true    Ran Out of Food in the Last Year: Never true  Housing: Low Risk  (08/10/2021)   Housing    Last Housing Risk Score: 0  Physical Activity: Insufficiently Active (08/10/2021)   Exercise Vital Sign    Days of Exercise per Week: 3 days    Minutes of Exercise per Session: 30 min  Social Connections: Moderately Isolated (08/10/2021)   Social Connection and Isolation Panel [NHANES]    Frequency of Communication with Friends and Family: Once a week    Frequency of Social Gatherings with Friends and Family: Twice a week    Attends Religious Services: Never    Marine scientist or Organizations: No    Attends Archivist Meetings: Never    Marital Status: Married  Stress: No Stress Concern Present (08/10/2021)   [employer] [place]    Feeling of  Stress : Only a little  Tobacco Use: Medium Risk (08/17/2021)   Patient History    Smoking Tobacco Use: Former    Smokeless Tobacco Use: Former    Passive Exposure: Not on Pensions consultant Needs: No Transportation Needs (08/10/2021)   PRAPARE - Hydrologist (Medical): No    Lack of Transportation (Non-Medical): No    CCM Care Plan  Allergies  Allergen Reactions   Sulfa Antibiotics Nausea Only    Medications Reviewed Today     Reviewed by Charlett Blake, MD (Physician) on 08/17/21 at 1418  Med List Status: <None>   Medication Order Taking? Sig Documenting Provider Last Dose Status Informant  aspirin EC 81 MG tablet [id number] Yes Take 81 mg by mouth daily as needed for moderate pain. [provider] Taking Active Spouse/Significant Other  atorvastatin (LIPITOR) 20 MG tablet [id number] Yes Take 1 tablet (20 mg total) by mouth daily. [name], MD Taking Active   Continuous Blood Gluc Receiver (FREESTYLE LIBRE 2 READER) [place] [id number] Yes Use to check blood sugar continous Copland, Frederico Hamman, MD Taking Active   Continuous Blood Gluc Sensor (FREESTYLE LIBRE 2 SENSOR) [place] [id number] Yes Use to check blood sugar continuous Copland, Frederico Hamman, MD Taking Active   donepezil (ARICEPT) 10 MG tablet [id number] Yes TAKE 1 TABLET BY MOUTH EVERYDAY AT BEDTIME Copland, Spencer, MD Taking Active   ferrous sulfate 325 (65 FE) MG tablet [id number] Yes Take 1 tablet (325 mg total) by mouth daily with breakfast. Copland, Spencer, MD Taking Active   glipiZIDE (GLUCOTROL) 10 MG tablet [id number] Yes Take 1 tablet (10 mg total) by mouth 2 (two) times daily before a meal. Copland, Frederico Hamman, MD Taking Active   Multiple Vitamins-Minerals (ICAPS LUTEIN & ZEAXANTHIN  PO) [id number] Yes Take 1 capsule by mouth in the morning and at bedtime. I - Caps [provider] Taking Active Spouse/Significant Other  Multiple Vitamins-Minerals ([place]) CAPS [id number] Yes Take by  mouth. [provider] Taking Active   Omega-3 Fatty Acids (FISH OIL) 1000 MG CAPS [id number] Yes Take 1 capsule (1,000 mg total) by mouth in the morning and at bedtime. Cathlyn Parsons, PA-C Taking Active   pantoprazole (PROTONIX) 40 MG tablet [id number] Yes Take 1 tablet (40 mg total) by mouth daily. Copland, Frederico Hamman, MD Taking Active   Propylene Glycol (SYSTANE BALANCE) 0.6 % SOLN [id number] Yes Apply 1 drop to eye 2 (two) times daily as needed (dry eyes). [provider] Taking Active Spouse/Significant Other  sertraline (ZOLOFT) 100 MG tablet [id number] Yes Take 2 tablets (200 mg total) by mouth daily. Copland, Frederico Hamman, MD Taking Active   tamsulosin [facility]) 0.4 MG CAPS capsule [id number] Yes Take 1 capsule (0.4 mg total) by mouth daily after supper. Copland, Frederico Hamman, MD Taking Active   triamcinolone cream (KENALOG) 0.1 % [id number] Yes Apply 1 application topically 2 (two) times daily. Copland, Frederico Hamman, MD Taking Active   vitamin B-12 (CYANOCOBALAMIN) 1000 MCG tablet [id number] Yes Take 1 tablet (1,000 mcg total) by mouth daily. [name], MD Taking Active             Patient Active Problem List   Diagnosis Date Noted   Right pontine cerebrovascular accident ([place]) 03/12/2021   GERD (gastroesophageal reflux disease) 03/08/2021   Sensorineural hearing loss (SNHL) of both ears 05/15/2017   Macular degeneration of both eyes 01/21/2014   Tobacco abuse, 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/08/2013   CAD (coronary artery disease)    Pericardial tamponade 09/17/2012   Acute pericarditis, unspecified 09/13/2012   Adenocarcinoma of left lung, stage 1 ([place]) 07/25/2011   Cancer of prostate w/med recur risk (T2b-c or Gleason 7 or PSA 10-20) ([place]) 07/25/2011   Nonmelanoma skin cancer 07/25/2011   History of colonic polyps 07/25/2011   Type 2 diabetes mellitus with vascular disease ([place]) 05/21/2007   HYPERCHOLESTEROLEMIA 05/21/2007   CARCINOMA, SKIN, SQUAMOUS CELL 01/08/2007   Generalized  anxiety disorder 01/08/2007   ALZHEIMER'S DISEASE, MILD 01/08/2007   Essential hypertension 01/08/2007    Immunization History  Administered Date(s) Administered   Fluad Quad(high Dose 65+) 12/26/2019, 03/13/2021   Influenza Whole 01/03/2007   Influenza, High Dose Seasonal PF 01/19/2018, 03/06/2019   Influenza,inj,Quad PF,6+ Mos 02/06/2013, 01/20/2014, 01/15/2015, 01/29/2016   Influenza-Unspecified 01/02/2017   Moderna Sars-Covid-2 Vaccination 06/03/2019, 07/04/2019, 03/19/2020   Pneumococcal Conjugate-13 01/20/2014   Pneumococcal Polysaccharide-23 01/02/2001   Td 04/04/1994   Tdap 01/25/2013    Conditions to be addressed/monitored:  Hypertension, Hyperlipidemia, Diabetes, Coronary Artery Disease, Chronic Kidney Disease, and Depression, Hx CVA, dementia  Care Plan : CCM Pharmacy Care Plan  Updates made by Charlton Haws, [employer] since 09/20/2021 12:00 AM     Problem: Hypertension, Hyperlipidemia, Diabetes, Coronary Artery Disease, Chronic Kidney Disease, and Depression, Hx CVA, dementia   Priority: High     Long-Range Goal: Disease Management   Start Date: 09/03/2020  Expected End Date: 09/21/2022  This Visit's Progress: Not on track  Priority: High  Note:   Current Barriers:  Suboptimal therapeutic regimen for diabetes Elevated BP at home  Pharmacist Clinical Goal(s):  Patient will achieve adherence to monitoring guidelines and medication adherence to achieve therapeutic efficacy adhere to plan to optimize therapeutic regimen for Diabetes as evidenced by report of adherence to recommended medication management changes through collaboration with  PharmD and provider.   Pharmacist Clinical Goal(s):  Patient will contact provider office for questions/concerns as evidenced notation of same in electronic health record through collaboration with PharmD and provider.   Interventions: 1:1 collaboration with [name], MD regarding development and update of comprehensive  plan of care as evidenced by provider attestation and co-signature Inter-disciplinary care team collaboration (see longitudinal plan of care) Comprehensive medication review performed; medication list updated in electronic medical record  Hyperlipidemia / CAD (LDL goal < 70) -Query controlled - LDL 83 (03/2021) prior to statin; lipids have not been re-checked since re-starting atorvastatin after CVA -Hx CAD; cath 2014; hx CVA 03/2021 -Current treatment: Atorvastatin 20 mg daily - Appropriate, Query Effective Aspirin 81 mg daily - Appropriate, Effective, Safe, Accessible OTC fish oil 100 mg BID - Appropriate, Effective, Safe, Accessible -Medications previously tried: atorvastatin 40 mg (pt self-dc'd 2021)  -Educated on Cholesterol goals; Benefits of statin for ASCVD risk reduction; -Recommended to continue current medication; re-check lipid panel at next appt  Hypertension (BP goal <140/90) -Query controlled - BP elevated at home -Current home readings: 155/70; seems to be 150s/60s -Denies hypotensive/hypertensive symptoms -Drinks coffee - 1 cup/day -Current treatment: None  -Medications previously tried: none reported  -Educated on Importance of home blood pressure monitoring;Proper BP monitoring technique; -Counseled to monitor BP at home daily - f/u call 2 weeks  Diabetes (A1c goal <8%) -Uncontrolled - A1c 8.3% (03/2021); BG at home suggests worse control than this -Neurology has recommended A1c goal < 6.5% for secondary prevention of stroke. -Current home glucose readings fasting glucose: 204, 240 (rarely < 200) post prandial glucose: 280-300s -Reports hypoglycemic/hyperglycemic symptoms -Current medications: Glipizide 10 mg BID -Appropriate, Query Effective Freestyle Libre 2 w/ reader - Appropriate, Effective, Safe, Accessible -Medications previously tried: metformin (GI); Januvia -Educated on A1c and blood sugar goals;  -Reviewed glipizide length of therapy - close to 10  years, efficacy is likely waning; reviewed therapy options - would avoid metformin due to GFR and previous intolerance, pt is a good candidate for GLP-1 RA and willing to do a once-weekly injection -Recommend to start Ozempic 0.25 mg x 4 weeks, then 0.5 mg weekly  Chronic Kidney Disease Stage 3b  -All medications assessed for renal dosing and appropriateness in chronic kidney disease. -Recommended to continue current medication  Depression/Anxiety (Goal: Improve mood) -Controlled - per patient report; dose increased 07/2021 -Current treatment: Sertraline 100 mg - 2 tab daily  - Appropriate, Effective, Safe, Accessible -Medications previously tried/failed: none reported -Recommended to continue current medication  Memory impairment (Goal: slow progression) -Controlled -Current treatment  Donepezil 10 mg daily HS - Appropriate, Effective, Safe, Accessible -Medications previously tried: n/a  -Recommended to continue current medication  Health Maintenance -Vaccine gaps: Shingrix -Current therapy:  Ferrous sulfate 325 mg daily - stopped due to diarrhea Vitamin B12 1000 mcg ICaps -Patient is satisfied with current therapy and denies issues -Recommended to continue current medication  Patient Goals/Self-Care Activities Patient will:  - take medications as prescribed as evidenced by patient report and record review focus on medication adherence by routine check glucose daily, document, and provide at future appointments check blood pressure daily, document, and provide at future appointments      Medication Assistance: None required.  Patient affirms current coverage meets needs.  Compliance/Adherence/Medication fill history: Care Gaps: Eye exam (due 04/23/20) Urine MA (due 12/19/20)  Star-Rating Drugs: Atorvastatin - PDC 100% Glipizide - PDC 100%  Medication Access: Within the past 30 days, how often has patient missed  a dose of medication? 0 Is a pillbox or other method  used to improve adherence? Yes  Factors that may affect medication adherence? no barriers identified Are meds synced by current pharmacy? No  Are meds delivered by current pharmacy? No  Does patient experience delays in picking up medications due to transportation concerns? No   [facility] Reviewed: Is patient disadvantaged to use [facility]?: No  Current Rx insurance plan: [employer] [place] Name and location of Current pharmacy:  [id number] [place] [place] [id number]: [phone]: [phone] [facility] services reviewed with patient today?: No  Patient requests to transfer care to [facility]?: No  Reason patient declined to change pharmacies: Not mentioned at this visit   Care Plan and Follow Up Patient Decision:  Patient agrees to Care Plan and Follow-up.  Plan: Telephone follow up appointment with care management team member scheduled for:  1 month  LCharlene Brooke PharmD, BTwin Cities HospitalClinical Pharmacist LConceptionPrimary Care at SKeokuk Area [phone]

## 2021-09-16 NOTE — Addendum Note (Signed)
Addended by: Charlton Haws on: 09/16/2021 04:15 PM   Modules accepted: Orders

## 2021-09-16 NOTE — Telephone Encounter (Signed)
Mendel Ryder, I think that is a great idea with renal function.

## 2021-09-16 NOTE — Telephone Encounter (Signed)
Spoke with patient's wife, patient's BG has been elevated at home (monitoring with [employer]).  Fasting BG: 200-250 Post-prandial BG: 280-300s Pt reports BG is rarely < 200.  Patient is taking glipizide 10 mg BID. Per chart review he has been on glipizide since at least 2014, approaching 10 years now. Sulfonylurea efficacy wanes over time and often becomes ineffective around 10 year mark. Discussed alternatives with patient - would avoid metformin due to GFR 35 and previous GI upset. They agree to start a once-weekly injectable. Discussed it will likely be $40-50 per month if their insurance plan does not have a deductible. Pt to call PharmD if cost is a problem, can pursue PAP if needed.  Recommend Ozempic - start with 0.25 mg weekly x 4 weeks, then increase to 0.5 mg weekly.   Routing to PCP for input.

## 2021-09-20 NOTE — Patient Instructions (Signed)
Visit Information  Phone number for Pharmacist: [phone]   Goals Addressed   None     Care Plan : [place]  Updates made by Charlton Haws, RPH since 09/20/2021 12:00 AM     Problem: Hypertension, Hyperlipidemia, Diabetes, Coronary Artery Disease, Chronic Kidney Disease, and Depression, Hx CVA, dementia   Priority: High     Long-Range Goal: Disease Management   Start Date: 09/03/2020  Expected End Date: 09/21/2022  This Visit's Progress: Not on track  Priority: High  Note:   Current Barriers:  Suboptimal therapeutic regimen for diabetes Elevated BP at home  Pharmacist Clinical Goal(s):  Patient will achieve adherence to monitoring guidelines and medication adherence to achieve therapeutic efficacy adhere to plan to optimize therapeutic regimen for Diabetes as evidenced by report of adherence to recommended medication management changes through collaboration with PharmD and provider.   Pharmacist Clinical Goal(s):  Patient will contact provider office for questions/concerns as evidenced notation of same in electronic health record through collaboration with PharmD and provider.   Interventions: 1:1 collaboration with [name], MD regarding development and update of comprehensive plan of care as evidenced by provider attestation and co-signature Inter-disciplinary care team collaboration (see longitudinal plan of care) Comprehensive medication review performed; medication list updated in electronic medical record  Hyperlipidemia / CAD (LDL goal < 70) -Query controlled - LDL 83 (03/2021) prior to statin; lipids have not been re-checked since re-starting atorvastatin after CVA -Hx CAD; cath 2014; hx CVA 03/2021 -Current treatment: Atorvastatin 20 mg daily - Appropriate, Query Effective Aspirin 81 mg daily - Appropriate, Effective, Safe, Accessible OTC fish oil 100 mg BID - Appropriate, Effective, Safe, Accessible -Medications previously tried:  atorvastatin 40 mg (pt self-dc'd 2021)  -Educated on Cholesterol goals; Benefits of statin for ASCVD risk reduction; -Recommended to continue current medication; re-check lipid panel at next appt  Hypertension (BP goal <140/90) -Query controlled - BP elevated at home -Current home readings: 155/70; seems to be 150s/60s -Denies hypotensive/hypertensive symptoms -Drinks coffee - 1 cup/day -Current treatment: None  -Medications previously tried: none reported  -Educated on Importance of home blood pressure monitoring;Proper BP monitoring technique; -Counseled to monitor BP at home daily - f/u call 2 weeks  Diabetes (A1c goal <8%) -Uncontrolled - A1c 8.3% (03/2021); BG at home suggests worse control than this -Neurology has recommended A1c goal < 6.5% for secondary prevention of stroke. -Current home glucose readings fasting glucose: 204, 240 (rarely < 200) post prandial glucose: 280-300s -Reports hypoglycemic/hyperglycemic symptoms -Current medications: Glipizide 10 mg BID -Appropriate, Query Effective Freestyle Libre 2 w/ reader - Appropriate, Effective, Safe, Accessible -Medications previously tried: metformin (GI); Januvia -Educated on A1c and blood sugar goals;  -Reviewed glipizide length of therapy - close to 10 years, efficacy is likely waning; reviewed therapy options - would avoid metformin due to GFR and previous intolerance, pt is a good candidate for GLP-1 RA and willing to do a once-weekly injection -Recommend to start Ozempic 0.25 mg x 4 weeks, then 0.5 mg weekly  Chronic Kidney Disease Stage 3b  -All medications assessed for renal dosing and appropriateness in chronic kidney disease. -Recommended to continue current medication  Depression/Anxiety (Goal: Improve mood) -Controlled - per patient report; dose increased 07/2021 -Current treatment: Sertraline 100 mg - 2 tab daily  - Appropriate, Effective, Safe, Accessible -Medications previously tried/failed: none  reported -Recommended to continue current medication  Memory impairment (Goal: slow progression) -Controlled -Current treatment  Donepezil 10 mg daily HS - Appropriate, Effective, Safe, Accessible -  Medications previously tried: n/a  -Recommended to continue current medication  Health Maintenance -Vaccine gaps: Shingrix -Current therapy:  Ferrous sulfate 325 mg daily - stopped due to diarrhea Vitamin B12 1000 mcg ICaps -Patient is satisfied with current therapy and denies issues -Recommended to continue current medication  Patient Goals/Self-Care Activities Patient will:  - take medications as prescribed as evidenced by patient report and record review focus on medication adherence by routine check glucose daily, document, and provide at future appointments check blood pressure daily, document, and provide at future appointments      Patient verbalizes understanding of instructions and care plan provided today and agrees to view in [place]. Active MyChart status and patient understanding of how to access instructions and care plan via MyChart confirmed with patient.    Telephone follow up appointment with pharmacy team member scheduled for: 6 weeks  Charlene Brooke, PharmD, BCACP Clinical Pharmacist [facility] at [facility] [phone]

## 2021-09-24 DIAGNOSIS — E119 Type 2 diabetes mellitus without complications: Secondary | ICD-10-CM | POA: Diagnosis not present

## 2021-09-29 ENCOUNTER — Ambulatory Visit: Payer: Medicare HMO | Admitting: Internal Medicine

## 2021-09-29 ENCOUNTER — Encounter: Payer: Self-pay | Admitting: Internal Medicine

## 2021-09-29 VITALS — BP 140/80 | HR 76 | Ht 69.0 in | Wt 182.0 lb

## 2021-09-29 DIAGNOSIS — E1159 Type 2 diabetes mellitus with other circulatory complications: Secondary | ICD-10-CM

## 2021-09-29 DIAGNOSIS — N1831 Chronic kidney disease, stage 3a: Secondary | ICD-10-CM | POA: Insufficient documentation

## 2021-09-29 DIAGNOSIS — E1122 Type 2 diabetes mellitus with diabetic chronic kidney disease: Secondary | ICD-10-CM | POA: Diagnosis not present

## 2021-09-29 LAB — POCT GLYCOSYLATED HEMOGLOBIN (HGB A1C): Hemoglobin A1C: 7.2 % — AB (ref 4.0–5.6)

## 2021-09-29 MED ORDER — GLIPIZIDE 10 MG PO TABS: 10.0000 mg | ORAL_TABLET | Freq: Every day | ORAL | 1 refills | Status: DC

## 2021-09-29 MED ORDER — OZEMPIC (0.25 OR 0.5 MG/DOSE) 2 MG/3ML ~~LOC~~ SOPN: 0.5000 mg | PEN_INJECTOR | SUBCUTANEOUS | 3 refills | Status: DC

## 2021-09-29 NOTE — Patient Instructions (Signed)
-   Decrease Glipizide 10 mg to ONE tablet before Breakfast  - Continue Ozempic 0.25 mg for 4 more weeks, than increase to 0.5 mg weekly    HOW TO TREAT LOW BLOOD SUGARS (Blood sugar LESS THAN 70 MG/DL) Please follow the RULE OF 15 for the treatment of hypoglycemia treatment (when your (blood sugars are less than 70 mg/dL)   STEP 1: Take 15 grams of carbohydrates when your blood sugar is low, which includes:  3-4 GLUCOSE TABS  OR 3-4 OZ OF JUICE OR REGULAR SODA OR ONE TUBE OF GLUCOSE GEL    STEP 2: RECHECK blood sugar in 15 MINUTES STEP 3: If your blood sugar is still low at the 15 minute recheck --> then, go back to STEP 1 and treat AGAIN with another 15 grams of carbohydrates.

## 2021-09-29 NOTE — Progress Notes (Signed)
Name: [name]  MRN/ DOB: [id number], 06-Jul-1930   Age/ Sex: 88 y.o., male    PCP: [name], MD   Reason for Endocrinology Evaluation: Type 2 Diabetes Mellitus     Date of Initial Endocrinology Visit: 09/29/2021     PATIENT IDENTIFIER: [name] is a 88 y.o. male with a past medical history of DM, CKD III, CVA and dementia, Hx of prostate cancer. The patient presented for initial endocrinology clinic visit on 09/29/2021 for consultative assistance with his diabetes management.    HPI: [name] is accompanied by his spouse today   Diagnosed with DM 2009 Prior Medications tried/Intolerance: Metformin- severe diarrhea. Ozempic started 2 weeks ago  Currently checking blood sugars multiple  x / day  Hypoglycemia episodes :yes               Symptoms: no                 Frequency: rare  Hemoglobin A1c has ranged from 6.8% in 2019, peaking at 8.3% in 2022.  In terms of diet, the patient eats every 2 hours through the day and night    Denies nausea, vomiting or diarrhea   HOME DIABETES REGIMEN: Glipizide 10 mg  Ozempic 0.25 mg weekly       CONTINUOUS GLUCOSE MONITORING RECORD INTERPRETATION    Dates of Recording: 6/15-6/28/2023  Sensor description:freestyle  Results statistics:   CGM use % of time 86  Average and SD 199/30.3  Time in range    42    %  % Time Above 180 36  % Time above 250 21  % Time Below target 1       Glycemic patterns summary: BG's optimal overnight with hyperglycemia noted during the day   Hyperglycemic episodes  postprandial   Hypoglycemic episodes occurred  just last night   Overnight periods: It has been optimal except last night when his BG trended to 53       DIABETIC COMPLICATIONS: Microvascular complications:  CKD III, macular degeneration  Denies: retinopathy, neuropathy  Last eye exam: Completed 2023  Macrovascular complications:  CVA, CAD Denies: CAD, PVD   PAST HISTORY: Past Medical  History:  Past Medical History:  Diagnosis Date   Adenocarcinoma of left lung, stage 1 ([place]) 2001   T1N0 stage I  adenoca left lung resected 01/03/00    Alzheimer's disease ([place])    CAD (coronary artery disease) 2014   a. 09/2012 Cath: LM nl, LAD 50-60p, D1 60-70 m, D1 50ost, LCX nl, RCA min irregs, EF 55-65%.   Generalized anxiety disorder 01/08/2007   Qualifier: Diagnosis of  By: Diona Browner MD, [name]     History of colonic polyps 07/25/2011   Macular degeneration of both eyes 01/21/2014   Nonmelanoma skin cancer 07/25/2011   Multiple lesions excised face/nose   Pericarditis    a. 09/2012 with effusion and tamponade, s/p window.  b.  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 SCC IN SITU TX CX3   SCC (squamous cell carcinoma) 04/12/2005   RIGHT NECK SUP. SCC IN SITU TX EXC   SCC (squamous cell carcinoma) 04/16/2013   LEFT TEMPLE SCC IN SITU TX CX3 5FU   SCC (squamous cell carcinoma) 04/16/2013   BELOW LEFT EYE SCC IN SITU TX CX3 5FU   SCC (squamous cell carcinoma) 04/16/2013   BELOW RIGHT EYE SCC IN SITU TX CX3 5FU   SCC (squamous cell carcinoma) 08/04/2014   LEFT CHEEK SCC IN SITU TX CX3 5FU   SCC (squamous cell carcinoma) 11/27/2018   LEFT TEMPLE SCC IN SITU TX WITH BX   SCC (squamous cell carcinoma)    SCC (squamous cell carcinoma)    SCC (squamous cell carcinoma)    SCC  (squamous cell carcinoma) Well Diff 09/13/2016   Tip of Nose SCC WELL DIFF TX (MOH's), and RIGHT INNER EYE SUP. SCC IN SITU TX TO WATCH   SCC (squamous cell carcinoma) Well Diff 05/30/2017   Right Cheekbone (Cx3,5FU) and Under Left Eye (Cx3,5FU)   Squamous cell carcinoma in situ (SCCIS) 04/12/2005   Right Neck Inf (Cx3), Right Neck Sup (Exc), and Bulb of Nose (Cx3,Exc)   Squamous cell carcinoma in situ (SCCIS) 09/27/2005   Nose (Cx3,Exc)   Squamous cell carcinoma in situ (SCCIS) 02/18/2013   Below Left Eye (tx p bx)   Squamous cell carcinoma in situ (SCCIS) 04/16/2013   Left Temple (Cx3,5FU), Below Left Eye (Cx3,5FU), Below Right Eye (Cx3,5FU)   Squamous cell carcinoma in situ (SCCIS) 08/04/2014   Left Cheek (Cx3,5FU)   Squamous cell carcinoma in situ (SCCIS) 09/13/2016   Right Inner Eye Sup. 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   Type 2 diabetes mellitus with vascular disease ([place]) 05/21/2007   Qualifier: Diagnosis of  By: Diona Browner MD, [name]     Type 2 DM with CKD stage 3 and hypertension ([place]) 07/16/2015   Unspecified essential hypertension    Past Surgical History:  Past Surgical History:  Procedure Laterality Date   HERNIA REPAIR     LEFT HEART CATHETERIZATION WITH CORONARY ANGIOGRAM N/A 09/13/2012   Procedure: LEFT HEART CATHETERIZATION WITH CORONARY ANGIOGRAM;  Surgeon: Sherren Mocha, MD;  Location: [facility] CATH LAB;  Service: Cardiovascular;  Laterality: N/A;   LOBECTOMY  2001   upper left   PERICARDIAL TAP N/A 09/16/2012   Procedure: PERICARDIAL TAP;  Surgeon: Sinclair Grooms, MD;  Location: [facility] CATH LAB;  Service: Cardiovascular;  Laterality: N/A;   PROSTATECTOMY  2003   SUBXYPHOID  PERICARDIAL WINDOW N/A 09/16/2012   Procedure: SUBXYPHOID PERICARDIAL WINDOW;  Surgeon: Rexene Alberts, MD;  Location: [facility];  Service: Thoracic;  Laterality: N/A;   TONSILLECTOMY      Social History:  reports that he quit smoking about 37 years ago. His smoking use included cigarettes. He started smoking about 68 years ago. He has a 84.00 pack-year smoking history. He quit smokeless tobacco  use about 35 years ago.  His smokeless tobacco use included chew. He reports that he does not drink alcohol and does not use drugs. Family History:  Family History  Problem Relation Age of Onset   Cancer Father        colon   Dementia Mother    Diabetes Mother    Cancer Brother        lung     HOME MEDICATIONS: Allergies as of 09/29/2021       Reactions   Sulfa Antibiotics Nausea Only        Medication List        Accurate as of September 29, 2021  4:18 PM. If you have any questions, ask your nurse or doctor.          aspirin EC 81 MG tablet Take 81 mg by mouth daily as needed for moderate pain.   atorvastatin 20 MG tablet Commonly known as: LIPITOR Take 1 tablet (20 mg total) by mouth daily.   donepezil 10 MG tablet Commonly known as: ARICEPT TAKE 1 TABLET BY MOUTH EVERYDAY AT BEDTIME   ferrous sulfate 325 (65 FE) MG tablet Take 1 tablet (325 mg total) by mouth daily with breakfast.   Fish Oil 1000 MG Caps Take 1 capsule (1,000 mg total) by mouth in the morning and at bedtime.   FreeStyle Libre 2 Reader [employer] Use to check blood sugar continous   FreeStyle Libre 2 Sensor Misc Use to check blood sugar continuous   glipiZIDE 10 MG tablet Commonly known as: GLUCOTROL Take 1 tablet (10 mg total) by mouth daily before breakfast. What changed: when to take this Changed by: Dorita Sciara, MD   ICaps Caps Take by mouth.   ICAPS LUTEIN & ZEAXANTHIN PO Take 1 capsule by mouth in the morning and at bedtime. I - Caps   Ozempic (0.25 or 0.5 MG/DOSE) 2 MG/3ML Sopn Generic  drug: Semaglutide(0.25 or 0.5MG /DOS) 0.5 mg by Other route once a week. Inject 0.25 mg once a week for 4 weeks, then increase to 0.5 mg once a week What changed:  how much to take how to take this when to take this Changed by: Dorita Sciara, MD   pantoprazole 40 MG tablet Commonly known as: PROTONIX Take 1 tablet (40 mg total) by mouth daily.   sertraline 100 MG tablet Commonly known as: ZOLOFT Take 2 tablets (200 mg total) by mouth daily.   Systane Balance 0.6 % Soln Generic drug: Propylene Glycol Apply 1 drop to eye 2 (two) times daily as needed (dry eyes).   tamsulosin 0.4 MG Caps capsule Commonly known as: FLOMAX Take 1 capsule (0.4 mg total) by mouth daily after supper.   triamcinolone cream 0.1 % Commonly known as: KENALOG Apply 1 application topically 2 (two) times daily.   vitamin B-12 1000 MCG tablet Commonly known as: CYANOCOBALAMIN Take 1 tablet (1,000 mcg total) by mouth daily.         ALLERGIES: Allergies  Allergen Reactions   Sulfa Antibiotics Nausea Only     REVIEW OF SYSTEMS: A comprehensive ROS was conducted with the patient and is negative except as per HPI    OBJECTIVE:   VITAL SIGNS: BP 140/80 (BP Location: Left Arm, Patient Position: Sitting, Cuff Size: Small)   Pulse 76   Ht 5\' 9"  (1.753 m)   Wt 182 lb (82.6 kg)   SpO2 98%   BMI 26.88 kg/m    PHYSICAL EXAM:  General: Pt appears well  and is in NAD  Neck: General: Supple without adenopathy or carotid bruits. Thyroid: Thyroid size normal.  No goiter or nodules appreciated.   Lungs: Clear with good BS bilat with no rales, rhonchi, or wheezes  Heart: RRR   Abdomen: Normoactive bowel sounds, soft, nontender, without masses or organomegaly palpable  Extremities:  Lower extremities - No pretibial edema. No lesions.  Neuro: MS is good with appropriate affect, pt is alert and Ox3     DATA REVIEWED:  Lab Results  Component Value Date   HGBA1C 7.2 (A) 09/29/2021   HGBA1C 8.3  (H) 03/08/2021   HGBA1C 7.3 (H) 05/11/2020    Latest Reference Range & Units 07/19/21 12:44  Sodium 135 - 145 mEq/L 137  Potassium 3.5 - 5.1 mEq/L 4.7  Chloride 96 - 112 mEq/L 102  CO2 19 - 32 mEq/L 27  Glucose 70 - 99 mg/dL 310 (H)  BUN 6 - 23 mg/dL 29 (H)  Creatinine 0.40 - 1.50 mg/dL 1.69 (H)  Calcium 8.4 - 10.5 mg/dL 9.2  Alkaline Phosphatase 39 - 117 U/L 86  Albumin 3.5 - 5.2 g/dL 4.1  AST 0 - 37 U/L 9  ALT 0 - 53 U/L 11  Total Protein 6.0 - 8.3 g/dL 6.6  Bilirubin, Direct 0.0 - 0.3 mg/dL 0.1  Total Bilirubin 0.2 - 1.2 mg/dL 0.4  GFR >60.00 mL/min 35.74 (L)     ASSESSMENT / PLAN / RECOMMENDATIONS:   1) Type 2 Diabetes Mellitus, Optimally controlled, With CKD III and macrovascular  complications - Most recent A1c of 7.2 %. Goal A1c < 8.0 %.    -Patient is intolerant to metformin -He has been on glipizide for long-term, he was started on Ozempic 2 weeks ago through his PCP.  He is tolerating it well but last night he developed an episode of hypoglycemia, it is unclear if this is true hypoglycemia versus a CGM error but in review of his CGM he has not had any hypoglycemic episodes prior to that, and has been noted with severe hyperglycemia during the day with BG's trending over 300 mg/DL -I am going to continue his Ozempic at this time, but will decrease glipizide by 50% -Per wife the patient eats every 2 hours, which makes it difficult to optimize his glucose control.  The goal for him is to prevent hypoglycemia and severe symptomatic hyperglycemia -I have asked him to contact our office for any future hypoglycemic episodes   MEDICATIONS: Decrease glipizide 10 mg daily Continue Ozempic 0.25 mg weekly for another 4 weeks, then increase to 0.5 mg weekly  EDUCATION / INSTRUCTIONS: BG monitoring instructions: Patient is instructed to check his blood sugars 3 times a day, before meals. Call [facility] if: BG persistently < 70  I reviewed the Rule of 15 for the  treatment of hypoglycemia in detail with the patient. Literature supplied.   2) Diabetic complications:  Eye: Does not have known diabetic retinopathy.  Neuro/ Feet: Does not have known diabetic peripheral neuropathy. Renal: Patient does  have known baseline CKD. He is not on an ACEI/ARB at present.      Follow-up in 3 months     Signed electronically by: Mack Guise, MD  [facility]  [facility] [address] [place], [place] [address] Phone: [phone] FAX: [phone]   CC: [name], [place] [address] Phone: [phone]  Fax: [phone]    Return to Endocrinology clinic as below: Future  Appointments  Date Time Provider [place]  10/20/2021  2:00 PM [name], MD [facility]  11/03/2021  8:45 AM [facility] CCM PHARMACIST [facility]  11/23/2021  1:30 PM Lavonna Monarch, MD [facility]  12/29/2021 11:10 AM Heriberto Stmartin, Melanie Crazier, MD [facility] None  08/12/2022  2:15 PM [facility] NURSE HEALTH ADVISOR [facility]

## 2021-09-30 ENCOUNTER — Encounter: Payer: Self-pay | Admitting: Internal Medicine

## 2021-10-01 DIAGNOSIS — E785 Hyperlipidemia, unspecified: Secondary | ICD-10-CM | POA: Diagnosis not present

## 2021-10-01 DIAGNOSIS — E1159 Type 2 diabetes mellitus with other circulatory complications: Secondary | ICD-10-CM | POA: Diagnosis not present

## 2021-10-01 DIAGNOSIS — I2510 Atherosclerotic heart disease of native coronary artery without angina pectoris: Secondary | ICD-10-CM

## 2021-10-01 DIAGNOSIS — E1122 Type 2 diabetes mellitus with diabetic chronic kidney disease: Secondary | ICD-10-CM | POA: Diagnosis not present

## 2021-10-01 DIAGNOSIS — I129 Hypertensive chronic kidney disease with stage 1 through stage 4 chronic kidney disease, or unspecified chronic kidney disease: Secondary | ICD-10-CM

## 2021-10-01 DIAGNOSIS — Z7984 Long term (current) use of oral hypoglycemic drugs: Secondary | ICD-10-CM

## 2021-10-01 DIAGNOSIS — N1832 Chronic kidney disease, stage 3b: Secondary | ICD-10-CM | POA: Diagnosis not present

## 2021-10-11 ENCOUNTER — Telehealth: Payer: Self-pay

## 2021-10-11 NOTE — Chronic Care Management (AMB) (Signed)
Spoke with wife [place], and he is doing well on the ozempic since starting the new medication, however, the patient has been eating lots of sweets and his BG's are from 174 - 315   BP today is 141/57  83-P  No other readings available   Charlene Brooke, CPP notified  Avel Sensor, [place]  [phone]

## 2021-10-20 ENCOUNTER — Encounter: Payer: Self-pay | Admitting: Family Medicine

## 2021-10-20 ENCOUNTER — Ambulatory Visit (INDEPENDENT_AMBULATORY_CARE_PROVIDER_SITE_OTHER): Payer: Medicare HMO | Admitting: Family Medicine

## 2021-10-20 VITALS — BP 118/60 | HR 88 | Temp 98.2°F | Wt 181.4 lb

## 2021-10-20 DIAGNOSIS — F0280 Dementia in other diseases classified elsewhere without behavioral disturbance: Secondary | ICD-10-CM

## 2021-10-20 DIAGNOSIS — E1159 Type 2 diabetes mellitus with other circulatory complications: Secondary | ICD-10-CM | POA: Diagnosis not present

## 2021-10-20 DIAGNOSIS — I10 Essential (primary) hypertension: Secondary | ICD-10-CM

## 2021-10-20 DIAGNOSIS — F411 Generalized anxiety disorder: Secondary | ICD-10-CM

## 2021-10-20 DIAGNOSIS — G309 Alzheimer's disease, unspecified: Secondary | ICD-10-CM | POA: Diagnosis not present

## 2021-10-20 DIAGNOSIS — I2510 Atherosclerotic heart disease of native coronary artery without angina pectoris: Secondary | ICD-10-CM | POA: Diagnosis not present

## 2021-10-20 DIAGNOSIS — R5383 Other fatigue: Secondary | ICD-10-CM | POA: Diagnosis not present

## 2021-10-20 DIAGNOSIS — I6350 Cerebral infarction due to unspecified occlusion or stenosis of unspecified cerebral artery: Secondary | ICD-10-CM

## 2021-10-20 DIAGNOSIS — R69 Illness, unspecified: Secondary | ICD-10-CM | POA: Diagnosis not present

## 2021-10-20 DIAGNOSIS — E7800 Pure hypercholesterolemia, unspecified: Secondary | ICD-10-CM | POA: Diagnosis not present

## 2021-10-20 NOTE — Progress Notes (Signed)
Ethan Ribble T. Auguste Tebbetts, MD, [place] at [facility] [place], [address]  Phone: [phone]  FAX: [phone]  [name] - 88 y.o. male  MRN [id number]  Date of Birth: 1930-12-15  Date: 10/20/2021  PCP: Ethan Loffler, MD  Referral: Ethan Loffler, MD  Chief Complaint  Patient presents with   Follow-up    3 month   Subjective:   [name] is a 88 y.o. very pleasant male patient with Body mass index is 26.78 kg/m. who presents with the following:  He is here in 72-month follow-up.  The last few times I had seen him he was still recovering for his right-sided stroke.  At his last office visit, I was worried that he has had additional stroke with after an acute event.  I repeated a brain MRI on July 31, 2021, and that showed no evidence of any acute, new stroke.  Whole body stays weak and feels sleepy.  Nothing is really happened new or acutely, but he does generally feel fatigued and tired a lot of the time.  He is sleeping okay at night basically.  He does have some weakness in the left lower extremity still.  His AFO is broken or not working quite as well.  He is able to walk and ambulate okay with a walker, he does have some weakness in dorsiflexion at the foot.  Large skin cancer at corner of L eye.   285 when he work up. Going up on Ozempic  Diabetes, he does see endocrinology. Diabetes Mellitus: Tolerating Medications: yes Compliance with diet: fair, Body mass index is 26.78 kg/m. Exercise: minimal / intermittent Avg blood sugars at home: Has been as high as into the 200s at times. Foot problems: none Hypoglycemia: none No nausea, vomitting, blurred vision, polyuria.  Lab Results  Component Value Date   HGBA1C 7.2 (A) 09/29/2021   HGBA1C 8.3 (H) 03/08/2021   HGBA1C 7.3 (H) 05/11/2020   Lab Results  Component Value Date   MICROALBUR 7.8 (H) 12/20/2019   LDLCALC 83 03/08/2021    CREATININE 1.81 (H) 10/20/2021    Wt Readings from Last 3 Encounters:  10/20/21 181 lb 6 oz (82.3 kg)  09/29/21 182 lb (82.6 kg)  08/17/21 178 lb (80.7 kg)     Recheck bmp and cbc TSH  - fatigue  Diabetes Mellitus: Tolerating Medications: yes Compliance with diet: fair, Body mass index is 26.78 kg/m. Exercise: minimal / intermittent Avg blood sugars at home: not checking Foot problems: none Hypoglycemia: none No nausea, vomitting, blurred vision, polyuria.  Lab Results  Component Value Date   HGBA1C 7.2 (A) 09/29/2021   HGBA1C 8.3 (H) 03/08/2021   HGBA1C 7.3 (H) 05/11/2020   Lab Results  Component Value Date   MICROALBUR 7.8 (H) 12/20/2019   LDLCALC 83 03/08/2021   CREATININE 1.81 (H) 10/20/2021    Wt Readings from Last 3 Encounters:  10/20/21 181 lb 6 oz (82.3 kg)  09/29/21 182 lb (82.6 kg)  08/17/21 178 lb (80.7 kg)    Lipids: Doing well, stable. Tolerating meds fine with no SE. Panel reviewed with patient.  Lipids: Lab Results  Component Value Date   CHOL 122 03/08/2021   Lab Results  Component Value Date   HDL 17 (L) 03/08/2021   Lab Results  Component Value Date   LDLCALC 83 03/08/2021   Lab Results  Component Value Date   TRIG 109 03/08/2021   Lab Results  Component Value Date   CHOLHDL 7.2 03/08/2021    Lab Results  Component Value Date   ALT 13 10/20/2021   AST 10 10/20/2021   ALKPHOS 87 10/20/2021   BILITOT 0.3 10/20/2021       Review of Systems is noted in the HPI, as appropriate  Objective:   BP 118/60   Pulse 88   Temp 98.2 F (36.8 C) (Oral)   Wt 181 lb 6 oz (82.3 kg)   SpO2 97%   BMI 26.78 kg/m   GEN: No acute distress; alert,appropriate. PULM: Breathing comfortably in no respiratory distress PSYCH: Normally interactive.  CV: RRR, no m/g/r   Laboratory and Imaging Data: Results for orders placed or performed in visit on 09/29/21  POCT glycosylated hemoglobin (Hb A1C)  Result Value Ref Range   Hemoglobin A1C  7.2 (A) 4.0 - 5.6 %   HbA1c POC (<> result, manual entry)     HbA1c, POC (prediabetic range)     HbA1c, POC (controlled diabetic range)       Assessment and Plan:     ICD-10-CM   1. Type 2 diabetes mellitus with vascular disease (HCC)  E11.59     2. Right pontine cerebrovascular accident ([place])  I63.50     3. Other fatigue  U13.24 Basic metabolic panel    CBC with Differential/Platelet    Hepatic function panel    TSH    4. Coronary artery disease involving native coronary artery of native heart without angina pectoris  I25.10     5. ALZHEIMER'S DISEASE, MILD  G30.9    F02.80     6. Essential hypertension  I10     7. HYPERCHOLESTEROLEMIA  E78.00     8. Generalized anxiety disorder  F41.1      His body is aging significantly.  Does continue to have significant chronic kidney disease, this appears worsened today compared to recently, but baseline he does have chronic kidney disease stage III.  Overall fatigue, he is somewhat anemic today.  He also still has some deficits after his stroke with some weakness on the left side.  Overall, he is dramatically better than he was after his stroke initially.  Anxiety is in better shape than it was the last couple of times I saw him.  He has done better on 200 mg of Zoloft.  Medication Management during today's office visit: No orders of the defined types were placed in this encounter.  There are no discontinued medications.  Orders placed today for conditions managed today: Orders Placed This Encounter  Procedures   Basic metabolic panel   CBC with Differential/Platelet   Hepatic function panel   TSH    Follow-up if needed: Return in about 6 months (around 04/22/2022).  Dr[employer] One speech-to-text software was used for transcription in this dictation.  Possible transcriptional errors can occur using Editor, commissioning.   Signed,  Ethan Deed. Gracyn Allor, MD   Outpatient Encounter Medications as of 10/20/2021  Medication  Sig   aspirin EC 81 MG tablet Take 81 mg by mouth daily as needed for moderate pain.   atorvastatin (LIPITOR) 20 MG tablet Take 1 tablet (20 mg total) by mouth daily.   Continuous Blood Gluc Receiver (FREESTYLE LIBRE 2 READER) DEVI Use to check blood sugar continous   Continuous Blood Gluc Sensor (FREESTYLE LIBRE 2 SENSOR) MISC Use to check blood sugar continuous   donepezil (ARICEPT) 10 MG tablet TAKE 1 TABLET BY MOUTH EVERYDAY AT BEDTIME   ferrous sulfate  325 (65 FE) MG tablet Take 1 tablet (325 mg total) by mouth daily with breakfast.   glipiZIDE (GLUCOTROL) 10 MG tablet Take 1 tablet (10 mg total) by mouth daily before breakfast.   Multiple Vitamins-Minerals (ICAPS LUTEIN & ZEAXANTHIN PO) Take 1 capsule by mouth in the morning and at bedtime. I - Caps   Multiple Vitamins-Minerals (ICAPS) CAPS Take by mouth.   Omega-3 Fatty Acids (FISH OIL) 1000 MG CAPS Take 1 capsule (1,000 mg total) by mouth in the morning and at bedtime.   pantoprazole (PROTONIX) 40 MG tablet Take 1 tablet (40 mg total) by mouth daily.   Propylene Glycol (SYSTANE BALANCE) 0.6 % SOLN Apply 1 drop to eye 2 (two) times daily as needed (dry eyes).   Semaglutide,0.25 or 0.5MG /DOS, (OZEMPIC, 0.25 OR 0.5 MG/DOSE,) 2 MG/3ML SOPN 0.5 mg by Other route once a week. Inject 0.25 mg once a week for 4 weeks, then increase to 0.5 mg once a week   sertraline (ZOLOFT) 100 MG tablet Take 2 tablets (200 mg total) by mouth daily.   tamsulosin (FLOMAX) 0.4 MG CAPS capsule Take 1 capsule (0.4 mg total) by mouth daily after supper.   triamcinolone cream (KENALOG) 0.1 % Apply 1 application topically 2 (two) times daily.   vitamin B-12 (CYANOCOBALAMIN) 1000 MCG tablet Take 1 tablet (1,000 mcg total) by mouth daily.   No facility-administered encounter medications on file as of 10/20/2021.

## 2021-10-21 LAB — CBC WITH DIFFERENTIAL/PLATELET
Basophils Absolute: 0 10*3/uL (ref 0.0–0.1)
Basophils Relative: 0.3 % (ref 0.0–3.0)
Eosinophils Absolute: 0 10*3/uL (ref 0.0–0.7)
Eosinophils Relative: 0.5 % (ref 0.0–5.0)
HCT: 34 % — ABNORMAL LOW (ref 39.0–52.0)
Hemoglobin: 11 g/dL — ABNORMAL LOW (ref 13.0–17.0)
Lymphocytes Relative: 5.3 % — ABNORMAL LOW (ref 12.0–46.0)
Lymphs Abs: 0.5 10*3/uL — ABNORMAL LOW (ref 0.7–4.0)
MCHC: 32.5 g/dL (ref 30.0–36.0)
MCV: 97.1 fl (ref 78.0–100.0)
Monocytes Absolute: 1.6 10*3/uL — ABNORMAL HIGH (ref 0.1–1.0)
Monocytes Relative: 18.1 % — ABNORMAL HIGH (ref 3.0–12.0)
Neutro Abs: 6.8 10*3/uL (ref 1.4–7.7)
Neutrophils Relative %: 75.8 % (ref 43.0–77.0)
Platelets: 133 10*3/uL — ABNORMAL LOW (ref 150.0–400.0)
RBC: 3.5 Mil/uL — ABNORMAL LOW (ref 4.22–5.81)
RDW: 12.7 % (ref 11.5–15.5)
WBC: 9 10*3/uL (ref 4.0–10.5)

## 2021-10-21 LAB — HEPATIC FUNCTION PANEL
ALT: 13 U/L (ref 0–53)
AST: 10 U/L (ref 0–37)
Albumin: 4.4 g/dL (ref 3.5–5.2)
Alkaline Phosphatase: 87 U/L (ref 39–117)
Bilirubin, Direct: 0.1 mg/dL (ref 0.0–0.3)
Total Bilirubin: 0.3 mg/dL (ref 0.2–1.2)
Total Protein: 6.7 g/dL (ref 6.0–8.3)

## 2021-10-21 LAB — BASIC METABOLIC PANEL WITH GFR
BUN: 37 mg/dL — ABNORMAL HIGH (ref 6–23)
CO2: 26 meq/L (ref 19–32)
Calcium: 9 mg/dL (ref 8.4–10.5)
Chloride: 105 meq/L (ref 96–112)
Creatinine, Ser: 1.81 mg/dL — ABNORMAL HIGH (ref 0.40–1.50)
GFR: 32.85 mL/min — ABNORMAL LOW
Glucose, Bld: 163 mg/dL — ABNORMAL HIGH (ref 70–99)
Potassium: 5.2 meq/L — ABNORMAL HIGH (ref 3.5–5.1)
Sodium: 141 meq/L (ref 135–145)

## 2021-10-21 LAB — TSH: TSH: 2.16 u[IU]/mL (ref 0.35–5.50)

## 2021-10-29 ENCOUNTER — Telehealth: Payer: Self-pay

## 2021-10-29 NOTE — Chronic Care Management (AMB) (Signed)
    Chronic Care Management Pharmacy Assistant   Name: BREN STEERS  MRN: [id number] DOB: 08-01-1930   Reason for Encounter: Reminder Call   Conditions to be addressed/monitored: CAD and DMII   Medications: Outpatient Encounter Medications as of 10/29/2021  Medication Sig   aspirin EC 81 MG tablet Take 81 mg by mouth daily as needed for moderate pain.   atorvastatin (LIPITOR) 20 MG tablet Take 1 tablet (20 mg total) by mouth daily.   Continuous Blood Gluc Receiver (FREESTYLE LIBRE 2 READER) DEVI Use to check blood sugar continous   Continuous Blood Gluc Sensor (FREESTYLE LIBRE 2 SENSOR) MISC Use to check blood sugar continuous   donepezil (ARICEPT) 10 MG tablet TAKE 1 TABLET BY MOUTH EVERYDAY AT BEDTIME   ferrous sulfate 325 (65 FE) MG tablet Take 1 tablet (325 mg total) by mouth daily with breakfast.   glipiZIDE (GLUCOTROL) 10 MG tablet Take 1 tablet (10 mg total) by mouth daily before breakfast.   Multiple Vitamins-Minerals (ICAPS LUTEIN & ZEAXANTHIN PO) Take 1 capsule by mouth in the morning and at bedtime. I - Caps   Multiple Vitamins-Minerals (ICAPS) CAPS Take by mouth.   Omega-3 Fatty Acids (FISH OIL) 1000 MG CAPS Take 1 capsule (1,000 mg total) by mouth in the morning and at bedtime.   pantoprazole (PROTONIX) 40 MG tablet Take 1 tablet (40 mg total) by mouth daily.   Propylene Glycol (SYSTANE BALANCE) 0.6 % SOLN Apply 1 drop to eye 2 (two) times daily as needed (dry eyes).   Semaglutide,0.25 or 0.5MG /DOS, (OZEMPIC, 0.25 OR 0.5 MG/DOSE,) 2 MG/3ML SOPN 0.5 mg by Other route once a week. Inject 0.25 mg once a week for 4 weeks, then increase to 0.5 mg once a week   sertraline (ZOLOFT) 100 MG tablet Take 2 tablets (200 mg total) by mouth daily.   tamsulosin (FLOMAX) 0.4 MG CAPS capsule Take 1 capsule (0.4 mg total) by mouth daily after supper.   triamcinolone cream (KENALOG) 0.1 % Apply 1 application topically 2 (two) times daily.   vitamin B-12 (CYANOCOBALAMIN) 1000 MCG tablet  Take 1 tablet (1,000 mcg total) by mouth daily.   No facility-administered encounter medications on file as of 10/29/2021.   [name] was contacted to remind of upcoming telephone visit with Charlene Brooke  on 11/03/21 at 8:45am. Patient was reminded to have any blood glucose and blood pressure readings available for review at appointment.   Patient confirmed appointment.  Spoke with wife [name]-she most likely will be speaking on his behalf   Are you having any problems with your medications? No   Do you have any concerns you like to discuss with the pharmacist? No   CCM referral has been placed prior to visit?  Yes    Star Rating Drugs: Medication:  Last Fill: Day Supply Atorvastatin 20mg  10/16/21 90 Glipizide 10mg  08/27/21 90 Ozempic   10/13/21 [place], CPP notified  Avel Sensor, [place]  [phone]

## 2021-11-03 ENCOUNTER — Ambulatory Visit (INDEPENDENT_AMBULATORY_CARE_PROVIDER_SITE_OTHER): Payer: Medicare HMO | Admitting: Pharmacist

## 2021-11-03 DIAGNOSIS — Z8673 Personal history of transient ischemic attack (TIA), and cerebral infarction without residual deficits: Secondary | ICD-10-CM

## 2021-11-03 DIAGNOSIS — I2510 Atherosclerotic heart disease of native coronary artery without angina pectoris: Secondary | ICD-10-CM

## 2021-11-03 DIAGNOSIS — I10 Essential (primary) hypertension: Secondary | ICD-10-CM

## 2021-11-03 DIAGNOSIS — F0280 Dementia in other diseases classified elsewhere without behavioral disturbance: Secondary | ICD-10-CM

## 2021-11-03 DIAGNOSIS — E1159 Type 2 diabetes mellitus with other circulatory complications: Secondary | ICD-10-CM

## 2021-11-03 DIAGNOSIS — N183 Chronic kidney disease, stage 3 unspecified: Secondary | ICD-10-CM

## 2021-11-03 DIAGNOSIS — E7800 Pure hypercholesterolemia, unspecified: Secondary | ICD-10-CM

## 2021-11-03 NOTE — Progress Notes (Signed)
Chronic Care Management Pharmacy Note  11/03/2021 Name:  [name] MRN:  [id number] DOB:  1930-05-27  Summary: CCM F/U visit -DM: A1c 7.2% (09/2021), improved; avg BG 189 per freestyle libre. -Hx CVA: LDL 83 (03/2021) prior to re-starting atorvastatin, goal LDL < 70 given recent CVA; lipids have not be re-checked yet -BP: BP is at goal in clinic (<130/80); home BP is elevated in 150s/60s, pt is checking only occasionally however -Nosebleeds: pt reports he has had 4-5 nosebleeds in past 2 weeks, all episodes resolved within an hour; he is taking aspirin 81 mg and fish oil, he denies use of NSAIDs  Recommendations/Changes made from today's visit: -Advised to stop fish oil due to excess bleeding risk; advised pt to contact PCP office if he has another nosebleed -Recommend repeat lipid panel at next OV  Plan: -[place] will call patient 2 weeks for BP log -Pharmacist follow up televisit scheduled for 6 weeks -PCP 10/20/21; endocrine 09/29/21    Subjective: [name] is an 88 y.o. year old male who is a primary patient of Copland, Frederico Hamman, MD.  The CCM team was consulted for assistance with disease management and care coordination needs.    Engaged with patient by telephone for follow up visit in response to provider referral for pharmacy case management and/or care coordination services.   Consent to Services:  The patient was given information about Chronic Care Management services, agreed to services, and gave verbal consent prior to initiation of services.  Please see initial visit note for detailed documentation.   Patient Care Team: [name], MD as PCP - General (Family Medicine) Sherren Mocha, MD as PCP - Cardiology (Cardiology) Lavonna Monarch, MD as Consulting Physician (Dermatology) Charlton Haws, [facility] as Pharmacist (Pharmacist)  Recent office visits: 10/20/21 Dr Lorelei Pont OV: f/u - BMP, CBC stable, GFR slightly down. 07/19/21 Dr Lorelei Pont  OV: f/u - depression/anxiety worse since stroke. C/o dysuria, constipation. Ordered MRI brain. Increased sertraline to 200 mg/day.  06/02/21 Dr Lorelei Pont OV: ER follow up (stroke sx) - improving. A1c 8.3%, increase glipizide to 10 mg BID. Increase sertraline to 150 mg. Rx triamcinolone cream for rash.  05/05/21 TE - ordered Freestyle Libre per pt request.  04/15/21 Dr Lorelei Pont OV: hospital f/u (Acute CVA) - increase glipizide 5 mg to BID. 04/12/21 pt message - referral to endocrine.  Recent consult visits: 09/29/21 Dr Kelton Pillar (Endocrine): new pt - A1c 7.2%; decrease glipizide to 10 mg daily, continue Ozempic. Goal to prevent hypoglcyemia  05/27/21 Dr Leonie Man (neurology): new pt - stroke. Continue aspirin 81 mg. BP goal < 130/90. A1c goal < 6.5%. LDL goal < 70.   05/18/21 Dr Letta Pate (phys med): f/u stroke. No changes.  Hospital visits: 05/19/21 ED visit [facility]): L sided weakness. CT scan wnl. Outpatient f/u.  03/12/21-04/02/21 inpatient rehab([facility]): acute CVA. 03/07/21-03/12/21 admission [facility]): acute CVA. No embolic source. Rec aspirin + Plavix for 3 weeks then aspirin alone.    Objective:  Lab Results  Component Value Date   CREATININE 1.81 (H) 10/20/2021   BUN 37 (H) 10/20/2021   GFR 32.85 (L) 10/20/2021   EGFR 44 (L) 07/30/2015   GFRNONAA 46 (L) 05/19/2021   GFRAA 58 (L) 06/25/2018   NA 141 10/20/2021   K 5.2 No hemolysis seen (H) 10/20/2021   CALCIUM 9.0 10/20/2021   CO2 26 10/20/2021   GLUCOSE 163 (H) 10/20/2021    Lab Results  Component Value Date/Time   HGBA1C 7.2 (A) 09/29/2021 02:54 PM  HGBA1C 8.3 (H) 03/08/2021 05:53 AM   HGBA1C 7.3 (H) 05/11/2020 10:31 AM   GFR 32.85 (L) 10/20/2021 02:49 PM   GFR 35.74 (L) 07/19/2021 12:44 PM   MICROALBUR 7.8 (H) 12/20/2019 09:20 AM   MICROALBUR 3.4 (H) 10/31/2018 10:39 AM    Last diabetic Eye exam:  Lab Results  Component Value Date/Time   HMDIABEYEEXA Retinopathy (A) 04/24/2019 12:00 AM    Last diabetic Foot exam:  Lab Results   Component Value Date/Time   HMDIABFOOTEX done 08/09/2012 12:00 AM     Lab Results  Component Value Date   CHOL 122 03/08/2021   HDL 17 (L) 03/08/2021   LDLCALC 83 03/08/2021   LDLDIRECT 130.0 12/20/2019   TRIG 109 03/08/2021   CHOLHDL 7.2 03/08/2021       Latest Ref Rng & Units 10/20/2021    2:49 PM 07/19/2021   12:44 PM 05/19/2021    2:44 PM  Hepatic Function  Total Protein 6.0 - 8.3 g/dL 6.7  6.6  6.1   Albumin 3.5 - 5.2 g/dL 4.4  4.1  3.6   AST 0 - 37 U/L '10  9  12   ' ALT 0 - 53 U/L '13  11  9   ' Alk Phosphatase 39 - 117 U/L 87  86  74   Total Bilirubin 0.2 - 1.2 mg/dL 0.3  0.4  0.4   Bilirubin, Direct 0.0 - 0.3 mg/dL 0.1  0.1      Lab Results  Component Value Date/Time   TSH 2.16 10/20/2021 02:49 PM   TSH 1.789 03/10/2021 12:57 AM   TSH 1.110 04/19/2011 01:29 PM       Latest Ref Rng & Units 10/20/2021    2:49 PM 07/19/2021   12:44 PM 05/19/2021    2:51 PM  CBC  WBC 4.0 - 10.5 K/uL 9.0  6.5    Hemoglobin 13.0 - 17.0 g/dL 11.0  12.8  12.9   Hematocrit 39.0 - 52.0 % 34.0  38.0  38.0   Platelets 150.0 - 400.0 K/uL 133.0  123.0     Iron/TIBC/Ferritin/ %Sat    Component Value Date/Time   IRON 77 03/10/2021 0057   TIBC 342 03/10/2021 0057   FERRITIN 42 03/10/2021 0057   IRONPCTSAT 23 03/10/2021 0057    No results found for: "VD25OH"  Clinical ASCVD: Yes  The ASCVD Risk score (Arnett DK, et al., 2019) failed to calculate for the following reasons:   The 2019 ASCVD risk score is only valid for ages 36 to 55   The patient has a prior MI or stroke diagnosis       08/17/2021    2:06 PM 08/10/2021    2:14 PM 05/18/2021    2:00 PM  Depression screen PHQ 2/9  Decreased Interest 0 0 0  Down, Depressed, Hopeless 1 0 0  PHQ - 2 Score 1 0 0     Social History   Tobacco Use  Smoking Status Former   Packs/day: 2.00   Years: 42.00   Total pack years: 84.00   Types: Cigarettes   Start date: 02/25/1953   Quit date: 04/04/1984   Years since quitting: 37.6  Smokeless  Tobacco Former   Types: Chew   Quit date: 07/27/1986  Tobacco Comments   quit tobacco 26 years ago   BP Readings from Last 3 Encounters:  10/20/21 118/60  09/29/21 140/80  08/17/21 130/73   Pulse Readings from Last 3 Encounters:  10/20/21 88  09/29/21 76  08/17/21 82  Wt Readings from Last 3 Encounters:  10/20/21 181 lb 6 oz (82.3 kg)  09/29/21 182 lb (82.6 kg)  08/17/21 178 lb (80.7 kg)   BMI Readings from Last 3 Encounters:  10/20/21 26.78 kg/m  09/29/21 26.88 kg/m  08/17/21 26.29 kg/m    Assessment/Interventions: Review of patient past medical history, allergies, medications, health status, including review of consultants reports, laboratory and other test data, was performed as part of comprehensive evaluation and provision of chronic care management services.   SDOH:  (Social Determinants of Health) assessments and interventions performed: No - done May 2023  SDOH Screenings   Alcohol Screen: Low Risk  (08/10/2021)   Alcohol Screen    Last Alcohol Screening Score (AUDIT): 0  Depression (PHQ2-9): Low Risk  (08/17/2021)   Depression (PHQ2-9)    PHQ-2 Score: 1  Financial Resource Strain: Low Risk  (08/10/2021)   Overall Financial Resource Strain (CARDIA)    Difficulty of Paying Living Expenses: Not hard at all  Food Insecurity: No Food Insecurity (08/10/2021)   Hunger Vital Sign    Worried About Running Out of Food in the Last Year: Never true    Ran Out of Food in the Last Year: Never true  Housing: Low Risk  (08/10/2021)   Housing    Last Housing Risk Score: 0  Physical Activity: Insufficiently Active (08/10/2021)   Exercise Vital Sign    Days of Exercise per Week: 3 days    Minutes of Exercise per Session: 30 min  Social Connections: Moderately Isolated (08/10/2021)   Social Connection and Isolation Panel [NHANES]    Frequency of Communication with Friends and Family: Once a week    Frequency of Social Gatherings with Friends and Family: Twice a week    Attends  Religious Services: Never    Marine scientist or Organizations: No    Attends Archivist Meetings: Never    Marital Status: Married  Stress: No Stress Concern Present (08/10/2021)   [employer] [place]    Feeling of Stress : Only a little  Tobacco Use: Medium Risk (10/20/2021)   Patient History    Smoking Tobacco Use: Former    Smokeless Tobacco Use: Former    Passive Exposure: Not on Pensions consultant Needs: No Transportation Needs (08/10/2021)   PRAPARE - Hydrologist (Medical): No    Lack of Transportation (Non-Medical): No    CCM Care Plan  Allergies  Allergen Reactions   Sulfa Antibiotics Nausea Only    Medications Reviewed Today     Reviewed by Carter Kitten, CMA (Certified Medical Assistant) on 10/20/21 at 1407  Med List Status: <None>   Medication Order Taking? Sig Documenting Provider Last Dose Status Informant  aspirin EC 81 MG tablet [id number]  Take 81 mg by mouth daily as needed for moderate pain. [provider]  Active Spouse/Significant Other  atorvastatin (LIPITOR) 20 MG tablet [id number]  Take 1 tablet (20 mg total) by mouth daily. Copland, Frederico Hamman, MD  Active   Continuous Blood Gluc Receiver (FREESTYLE LIBRE 2 READER) [place] [id number]  Use to check blood sugar continous Copland, Frederico Hamman, MD  Active   Continuous Blood Gluc Sensor (FREESTYLE LIBRE 2 SENSOR) [place] [id number]  Use to check blood sugar continuous Copland, Spencer, MD  Active   donepezil (ARICEPT) 10 MG tablet [id number]  TAKE 1 TABLET BY MOUTH EVERYDAY AT BEDTIME Copland, Spencer, MD  Active   ferrous sulfate 325 (65  FE) MG tablet [id number]  Take 1 tablet (325 mg total) by mouth daily with breakfast. Copland, Spencer, MD  Active   glipiZIDE (GLUCOTROL) 10 MG tablet [id number]  Take 1 tablet (10 mg total) by mouth daily before breakfast. Shamleffer, Melanie Crazier, MD  Active   Multiple  Vitamins-Minerals (ICAPS LUTEIN & ZEAXANTHIN PO) [id number]  Take 1 capsule by mouth in the morning and at bedtime. I - Caps [provider]  Active Spouse/Significant Other  Multiple Vitamins-Minerals ([place]) CAPS [id number]  Take by mouth. [provider]  Active   Omega-3 Fatty Acids (FISH OIL) 1000 MG CAPS [id number]  Take 1 capsule (1,000 mg total) by mouth in the morning and at bedtime. AngiulliLavon Paganini, PA-C  Active   pantoprazole (PROTONIX) 40 MG tablet [id number]  Take 1 tablet (40 mg total) by mouth daily. Copland, Frederico Hamman, MD  Active   Propylene Glycol (SYSTANE BALANCE) 0.6 % SOLN [id number]  Apply 1 drop to eye 2 (two) times daily as needed (dry eyes). [provider]  Active Spouse/Significant Other  Semaglutide,0.25 or 0.5MG/DOS, (OZEMPIC, 0.25 OR 0.5 MG/DOSE,) 2 MG/3ML SOPN [id number]  0.5 mg by Other route once a week. Inject 0.25 mg once a week for 4 weeks, then increase to 0.5 mg once a week Shamleffer, Melanie Crazier, MD  Active   sertraline (ZOLOFT) 100 MG tablet [id number]  Take 2 tablets (200 mg total) by mouth daily. Copland, Frederico Hamman, MD  Active   tamsulosin (FLOMAX) 0.4 MG CAPS capsule [id number]  Take 1 capsule (0.4 mg total) by mouth daily after supper. Copland, Frederico Hamman, MD  Active   triamcinolone cream (KENALOG) 0.1 % [id number]  Apply 1 application topically 2 (two) times daily. Copland, Frederico Hamman, MD  Active   vitamin B-12 (CYANOCOBALAMIN) 1000 MCG tablet [id number]  Take 1 tablet (1,000 mcg total) by mouth daily. [name], MD  Active             Patient Active Problem List   Diagnosis Date Noted   Right pontine cerebrovascular accident ([place]) 03/12/2021   GERD (gastroesophageal reflux disease) 03/08/2021   Sensorineural hearing loss (SNHL) of both ears 05/15/2017   Macular degeneration of both eyes 01/21/2014   Tobacco abuse, 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/08/2013   CAD (coronary artery disease)    Pericardial tamponade 09/17/2012   Acute pericarditis,  unspecified 09/13/2012   Adenocarcinoma of left lung, stage 1 ([place]) 07/25/2011   Cancer of prostate w/med recur risk (T2b-c or Gleason 7 or PSA 10-20) ([place]) 07/25/2011   Nonmelanoma skin cancer 07/25/2011   History of colonic polyps 07/25/2011   Type 2 diabetes mellitus with vascular disease ([place]) 05/21/2007   HYPERCHOLESTEROLEMIA 05/21/2007   CARCINOMA, SKIN, SQUAMOUS CELL 01/08/2007   Generalized anxiety disorder 01/08/2007   ALZHEIMER'S DISEASE, MILD 01/08/2007   Essential hypertension 01/08/2007    Immunization History  Administered Date(s) Administered   Fluad Quad(high Dose 65+) 12/26/2019, 03/13/2021   Influenza Whole 01/03/2007   Influenza, High Dose Seasonal PF 01/19/2018, 03/06/2019   Influenza,inj,Quad PF,6+ Mos 02/06/2013, 01/20/2014, 01/15/2015, 01/29/2016   Influenza-Unspecified 01/02/2017   Moderna Sars-Covid-2 Vaccination 06/03/2019, 07/04/2019, 03/19/2020   Pneumococcal Conjugate-13 01/20/2014   Pneumococcal Polysaccharide-23 01/02/2001   Td 04/04/1994   Tdap 01/25/2013    Conditions to be addressed/monitored:  Hypertension, Hyperlipidemia, Diabetes, Coronary Artery Disease, Chronic Kidney Disease, and Depression, Hx CVA, dementia  Care Plan : [place]  Updates made by Charlton Haws, [place] since 11/03/2021 12:00 AM     Problem: Hypertension, Hyperlipidemia, Diabetes,  Coronary Artery Disease, Chronic Kidney Disease, and Depression, Hx CVA, dementia   Priority: High     Long-Range Goal: Disease Management   Start Date: 09/03/2020  Expected End Date: 09/21/2022  This Visit's Progress: On track  Recent Progress: Not on track  Priority: High  Note:   Current Barriers:  Elevated BP at home  Pharmacist Clinical Goal(s):  Patient will adhere to plan to optimize therapeutic regimen for Diabetes as evidenced by report of adherence to recommended medication management changes through collaboration with PharmD and provider.   Pharmacist Clinical  Goal(s):  Patient will contact provider office for questions/concerns as evidenced notation of same in electronic health record through collaboration with PharmD and provider.   Interventions: 1:1 collaboration with [name], MD regarding development and update of comprehensive plan of care as evidenced by provider attestation and co-signature Inter-disciplinary care team collaboration (see longitudinal plan of care) Comprehensive medication review performed; medication list updated in electronic medical record  Hyperlipidemia / CAD (LDL goal < 70) -Query controlled - LDL 83 (03/2021) prior to statin; lipids have not been re-checked since re-starting atorvastatin after CVA -Hx CAD; cath 2014; hx CVA 03/2021 -Current treatment: Atorvastatin 20 mg daily - Appropriate, Query Effective Aspirin 81 mg daily - Appropriate, Effective, Safe, Accessible OTC fish oil 100 mg BID - Appropriate, Effective, Safe, Accessible -Medications previously tried: atorvastatin 40 mg (pt self-dc'd 2021)  -Educated on Cholesterol goals; Benefits of statin for ASCVD risk reduction; -Advised to stop fish oil due to recent nosebleeds (see below)  Hypertension (BP goal <140/90) -Query controlled - BP elevated at home, at goal in office visit -Current home readings: 158/60, 160/68 -Denies hypotensive/hypertensive symptoms -Drinks coffee - 1 cup/day -Current treatment: None  -Medications previously tried: none reported  -Educated on Importance of home blood pressure monitoring;Proper BP monitoring technique; -Counseled to monitor BP at home daily  Diabetes (A1c goal <8%) -Controlled - A1c 7.2% (09/2021) BG at home suggests worse control than this -Neurology has recommended A1c goal < 6.5% for secondary prevention of stroke. -Established with endocrine (Dr Kelton Pillar) 09/2021 -Current home glucose readings Elenor Legato): avg 186 -Reports hypoglycemic/hyperglycemic symptoms -Current medications: Glipizide 10 mg  daily -Appropriate, Query Effective Ozempic 0.5 mg weekly - Appropriate, Query Effective Freestyle Libre 2 w/ reader - Appropriate, Effective, Safe, Accessible -Medications previously tried: metformin (GI); Januvia -Educated on A1c and blood sugar goals;  -Recommend to continue current medication  Chronic Kidney Disease Stage 3b  -All medications assessed for renal dosing and appropriateness in chronic kidney disease. -Recommended to continue current medication  Depression/Anxiety (Goal: Improve mood) -Controlled - per patient report; dose increased 07/2021 -Current treatment: Sertraline 100 mg - 2 tab daily  - Appropriate, Effective, Safe, Accessible -Medications previously tried/failed: none reported -Recommended to continue current medication  Memory impairment (Goal: slow progression) -Controlled -Current treatment  Donepezil 10 mg daily HS - Appropriate, Effective, Safe, Accessible -Medications previously tried: n/a  -Recommended to continue current medication  Nosebleeds  -Pt reports he has has 4-5 nosebleeds in the past 2 weeks, he does not normally have nosebleeds; he reports each episode resolved within an hour and he denies dizziness, lightheadedness, etc. -the only recent med changes were reduction in glipizide and increase in Ozempic, neither of which would contribute to nosebleeds -Reviewed med list for bleeding issues - pt is on aspirin 81 mg daily and fish oil BID -Advised to stop Fish oil. Advised patient to contact PCP office if he has another nosebleed.  Health Maintenance -Vaccine gaps: Shingrix -  Current therapy:  Ferrous sulfate 325 mg daily - stopped due to diarrhea Vitamin B12 1000 mcg ICaps  Patient Goals/Self-Care Activities Patient will:  - take medications as prescribed as evidenced by patient report and record review focus on medication adherence by routine check glucose daily, document, and provide at future appointments check blood pressure  daily, document, and provide at future appointments       Medication Assistance: None required.  Patient affirms current coverage meets needs.  Compliance/Adherence/Medication fill history: Care Gaps: Eye exam (due 04/23/20) Urine MA (due 12/19/20)  Star-Rating Drugs: Atorvastatin - PDC 100% Glipizide - PDC 100%  Medication Access: Within the past 30 days, how often has patient missed a dose of medication? 0 Is a pillbox or other method used to improve adherence? Yes  Factors that may affect medication adherence? no barriers identified Are meds synced by current pharmacy? No  Are meds delivered by current pharmacy? No  Does patient experience delays in picking up medications due to transportation concerns? No   [facility] Reviewed: Is patient disadvantaged to use [facility]?: No  Current Rx insurance plan: [employer] [place] Name and location of Current pharmacy:  [id number] [place] [place]  [id number]: [phone]: [phone] [facility] services reviewed with patient today?: No  Patient requests to transfer care to [facility]?: No  Reason patient declined to change pharmacies: Not mentioned at this visit   Care Plan and Follow Up Patient Decision:  Patient agrees to Care Plan and Follow-up.  Plan: Telephone follow up appointment with care management team member scheduled for:  6 weeks  LCharlene Brooke PharmD, BCACP Clinical Pharmacist [phone]

## 2021-11-08 NOTE — Patient Instructions (Addendum)
Visit Information  Phone number for Pharmacist: [phone]   Goals Addressed   None     Patient Care Plan: CCM Pharmacy Care Plan     Problem Identified: Hypertension, Hyperlipidemia, Diabetes, Coronary Artery Disease, Chronic Kidney Disease, and Depression, Hx CVA, dementia   Priority: High     Long-Range Goal: Disease Management   Start Date: 09/03/2020  Expected End Date: 09/21/2022  This Visit's Progress: On track  Recent Progress: Not on track  Priority: High  Note:   Current Barriers:  Elevated BP at home  Pharmacist Clinical Goal(s):  Patient will adhere to plan to optimize therapeutic regimen for Diabetes as evidenced by report of adherence to recommended medication management changes through collaboration with PharmD and provider.   Pharmacist Clinical Goal(s):  Patient will contact provider office for questions/concerns as evidenced notation of same in electronic health record through collaboration with PharmD and provider.   Interventions: 1:1 collaboration with [name], MD regarding development and update of comprehensive plan of care as evidenced by provider attestation and co-signature Inter-disciplinary care team collaboration (see longitudinal plan of care) Comprehensive medication review performed; medication list updated in electronic medical record  Hyperlipidemia / CAD (LDL goal < 70) -Query controlled - LDL 83 (03/2021) prior to statin; lipids have not been re-checked since re-starting atorvastatin after CVA -Hx CAD; cath 2014; hx CVA 03/2021 -Current treatment: Atorvastatin 20 mg daily - Appropriate, Query Effective Aspirin 81 mg daily - Appropriate, Effective, Safe, Accessible OTC fish oil 100 mg BID - Appropriate, Effective, Safe, Accessible -Medications previously tried: atorvastatin 40 mg (pt self-dc'd 2021)  -Educated on Cholesterol goals; Benefits of statin for ASCVD risk reduction; -Advised to stop fish oil due to recent nosebleeds  (see below)  Hypertension (BP goal <140/90) -Query controlled - BP elevated at home, at goal in office visit -Current home readings: 158/60, 160/68 -Denies hypotensive/hypertensive symptoms -Drinks coffee - 1 cup/day -Current treatment: None  -Medications previously tried: none reported  -Educated on Importance of home blood pressure monitoring;Proper BP monitoring technique; -Counseled to monitor BP at home daily  Diabetes (A1c goal <8%) -Controlled - A1c 7.2% (09/2021) BG at home suggests worse control than this -Neurology has recommended A1c goal < 6.5% for secondary prevention of stroke. -Established with endocrine (Dr Kelton Pillar) 09/2021 -Current home glucose readings Elenor Legato): avg 186 -Reports hypoglycemic/hyperglycemic symptoms -Current medications: Glipizide 10 mg daily -Appropriate, Query Effective Ozempic 0.5 mg weekly - Appropriate, Query Effective Freestyle Libre 2 w/ reader - Appropriate, Effective, Safe, Accessible -Medications previously tried: metformin (GI); Januvia -Educated on A1c and blood sugar goals;  -Recommend to continue current medication  Chronic Kidney Disease Stage 3b  -All medications assessed for renal dosing and appropriateness in chronic kidney disease. -Recommended to continue current medication  Depression/Anxiety (Goal: Improve mood) -Controlled - per patient report; dose increased 07/2021 -Current treatment: Sertraline 100 mg - 2 tab daily  - Appropriate, Effective, Safe, Accessible -Medications previously tried/failed: none reported -Recommended to continue current medication  Memory impairment (Goal: slow progression) -Controlled -Current treatment  Donepezil 10 mg daily HS - Appropriate, Effective, Safe, Accessible -Medications previously tried: n/a  -Recommended to continue current medication  Nosebleeds  -Pt reports he has has 4-5 nosebleeds in the past 2 weeks, he does not normally have nosebleeds; he reports each episode resolved  within an hour and he denies dizziness, lightheadedness, etc. -the only recent med changes were reduction in glipizide and increase in Ozempic, neither of which would contribute to nosebleeds -Reviewed med list for bleeding  issues - pt is on aspirin 81 mg daily and fish oil BID -Advised to stop Fish oil. Advised patient to contact PCP office if he has another nosebleed.  Health Maintenance -Vaccine gaps: Shingrix -Current therapy:  Ferrous sulfate 325 mg daily - stopped due to diarrhea Vitamin B12 1000 mcg ICaps  Patient Goals/Self-Care Activities Patient will:  - take medications as prescribed as evidenced by patient report and record review focus on medication adherence by routine check glucose daily, document, and provide at future appointments check blood pressure daily, document, and provide at future appointments      Patient verbalizes understanding of instructions and care plan provided today and agrees to view in [place]. Active MyChart status and patient understanding of how to access instructions and care plan via MyChart confirmed with patient.    Telephone follow up appointment with pharmacy team member scheduled for: 6 weeks  Charlene Brooke, PharmD, BCACP Clinical Pharmacist [place] [facility] [phone]

## 2021-11-19 ENCOUNTER — Encounter (HOSPITAL_COMMUNITY): Payer: Self-pay

## 2021-11-19 ENCOUNTER — Emergency Department (HOSPITAL_COMMUNITY)
Admission: EM | Admit: 2021-11-19 | Discharge: 2021-11-20 | Disposition: A | Discharge status: Home / Self Care | Payer: Medicare HMO | Attending: Emergency Medicine | Admitting: Emergency Medicine

## 2021-11-19 ENCOUNTER — Other Ambulatory Visit: Payer: Self-pay

## 2021-11-19 DIAGNOSIS — Z743 Need for continuous supervision: Secondary | ICD-10-CM | POA: Diagnosis not present

## 2021-11-19 DIAGNOSIS — R58 Hemorrhage, not elsewhere classified: Secondary | ICD-10-CM | POA: Diagnosis not present

## 2021-11-19 DIAGNOSIS — Z7982 Long term (current) use of aspirin: Secondary | ICD-10-CM | POA: Insufficient documentation

## 2021-11-19 DIAGNOSIS — R040 Epistaxis: Secondary | ICD-10-CM | POA: Diagnosis not present

## 2021-11-19 DIAGNOSIS — D649 Anemia, unspecified: Secondary | ICD-10-CM | POA: Insufficient documentation

## 2021-11-19 DIAGNOSIS — D696 Thrombocytopenia, unspecified: Secondary | ICD-10-CM | POA: Diagnosis not present

## 2021-11-19 DIAGNOSIS — Z794 Long term (current) use of insulin: Secondary | ICD-10-CM | POA: Diagnosis not present

## 2021-11-19 MED ORDER — OXYMETAZOLINE HCL 0.05 % NA SOLN
1.0000 | Freq: Once | NASAL | Status: AC
  Administered 2021-11-19: 1 via NASAL
  Filled 2021-11-19: qty 30

## 2021-11-19 MED ORDER — LIDOCAINE VISCOUS HCL 2 % MT SOLN
15.0000 mL | Freq: Once | OROMUCOSAL | Status: AC
  Administered 2021-11-19: 15 mL via OROMUCOSAL
  Filled 2021-11-19: qty 15

## 2021-11-19 NOTE — ED Triage Notes (Signed)
Pt's nose has been bleeding for 2 hours.  EMS applied Afrin x2 puffs bilaterally.  Right nare is actively bleeding.

## 2021-11-19 NOTE — ED Provider Notes (Signed)
[facility] DEPT Provider Note   CSN: [id number] Arrival date & time: 11/19/21  2246     History {Add pertinent medical, surgical, social history, OB history to HPI:1} Chief Complaint  Patient presents with   Epistaxis    [name] is a 88 y.o. male.  The history is provided by the patient, a relative and medical records.  Epistaxis [name] is a 88 y.o. male who presents to the Emergency Department complaining of epistaxis.  He presents to the emergency department for evaluation of epistaxis that has been ongoing for the last 2 hours.  It is coming from his right nare.  He has been experiencing intermittent epistaxis episodes since he had a CVA in December.  He takes 81 mg aspirin daily.     Home Medications Prior to Admission medications   Medication Sig Start Date End Date Taking? Authorizing Provider  aspirin EC 81 MG tablet Take 81 mg by mouth daily as needed for moderate pain.    [provider]  atorvastatin (LIPITOR) 20 MG tablet Take 1 tablet (20 mg total) by mouth daily. 04/23/21   Copland, Frederico Hamman, MD  Continuous Blood Gluc Receiver (FREESTYLE LIBRE 2 READER) DEVI Use to check blood sugar continous 05/05/21   Copland, Frederico Hamman, MD  Continuous Blood Gluc Sensor (FREESTYLE LIBRE 2 SENSOR) MISC Use to check blood sugar continuous 05/05/21   Copland, Frederico Hamman, MD  donepezil (ARICEPT) 10 MG tablet TAKE 1 TABLET BY MOUTH EVERYDAY AT BEDTIME 05/11/21   Copland, Frederico Hamman, MD  ferrous sulfate 325 (65 FE) MG tablet Take 1 tablet (325 mg total) by mouth daily with breakfast. 04/23/21   Copland, Frederico Hamman, MD  glipiZIDE (GLUCOTROL) 10 MG tablet Take 1 tablet (10 mg total) by mouth daily before breakfast. 09/29/21   Shamleffer, Melanie Crazier, MD  Multiple Vitamins-Minerals (ICAPS LUTEIN & ZEAXANTHIN PO) Take 1 capsule by mouth in the morning and at bedtime. I - Caps    [provider]  Multiple Vitamins-Minerals (ICAPS) CAPS Take by  mouth.    [provider]  pantoprazole (PROTONIX) 40 MG tablet Take 1 tablet (40 mg total) by mouth daily. 04/23/21   Copland, Frederico Hamman, MD  Propylene Glycol (SYSTANE BALANCE) 0.6 % SOLN Apply 1 drop to eye 2 (two) times daily as needed (dry eyes).    [provider]  Semaglutide,0.25 or 0.5MG /DOS, (OZEMPIC, 0.25 OR 0.5 MG/DOSE,) 2 MG/3ML SOPN 0.5 mg by Other route once a week. Inject 0.25 mg once a week for 4 weeks, then increase to 0.5 mg once a week 09/29/21   Shamleffer, Melanie Crazier, MD  sertraline (ZOLOFT) 100 MG tablet Take 2 tablets (200 mg total) by mouth daily. 07/19/21   Copland, Frederico Hamman, MD  tamsulosin (FLOMAX) 0.4 MG CAPS capsule Take 1 capsule (0.4 mg total) by mouth daily after supper. 04/23/21   Copland, Frederico Hamman, MD  triamcinolone cream (KENALOG) 0.1 % Apply 1 application topically 2 (two) times daily. 06/02/21   Copland, Frederico Hamman, MD  vitamin B-12 (CYANOCOBALAMIN) 1000 MCG tablet Take 1 tablet (1,000 mcg total) by mouth daily. 04/23/21   Copland, Frederico Hamman, MD      Allergies    Sulfa antibiotics    Review of Systems   Review of Systems  HENT:  Positive for nosebleeds.   All other systems reviewed and are negative.   Physical Exam Updated Vital Signs BP (!) 157/86   Pulse 92   Temp 97.6 F (36.4 C) (Oral)   Resp 16  Ht 5\' 9"  (1.753 m)   Wt 82.3 kg   SpO2 97%   BMI 26.79 kg/m  Physical Exam Vitals and nursing note reviewed.  Constitutional:      Appearance: He is well-developed.  HENT:     Head: Normocephalic and atraumatic.     Comments: Scant amount of blood in the left nare.  There is a small amount of bleeding from the right nare, which is filled with blood.  There is a small amount of blood in the posterior oropharynx Cardiovascular:     Rate and Rhythm: Normal rate and regular rhythm.     Heart sounds: No murmur heard. Pulmonary:     Effort: Pulmonary effort is normal. No respiratory distress.     Breath sounds: Normal breath sounds.   Abdominal:     Palpations: Abdomen is soft.     Tenderness: There is no abdominal tenderness. There is no guarding or rebound.  Musculoskeletal:        General: No tenderness.  Skin:    General: Skin is warm and dry.  Neurological:     Mental Status: He is alert.  Psychiatric:        Behavior: Behavior normal.     ED Results / Procedures / Treatments   Labs (all labs ordered are listed, but only abnormal results are displayed) Labs Reviewed - No data to display  EKG None  Radiology No results found.  Procedures Procedures  {Document cardiac monitor, telemetry assessment procedure when appropriate:1}  Medications Ordered in ED Medications  oxymetazoline (AFRIN) 0.05 % nasal spray 1 spray (1 spray Each Nare Given by Other 11/19/21 2320)  lidocaine (XYLOCAINE) 2 % viscous mouth solution 15 mL (15 mLs Mouth/Throat Given by Other 11/19/21 2320)    ED Course/ Medical Decision Making/ A&P                           Medical Decision Making Risk OTC drugs. Prescription drug management.   ***  {Document critical care time when appropriate:1} {Document review of labs and clinical decision tools ie heart score, Chads2Vasc2 etc:1}  {Document your independent review of radiology images, and any outside records:1} {Document your discussion with family members, caretakers, and with consultants:1} {Document social determinants of health affecting pt's care:1} {Document your decision making why or why not admission, treatments were needed:1} Final Clinical Impression(s) / ED Diagnoses Final diagnoses:  None    Rx / DC Orders ED Discharge Orders     None

## 2021-11-20 MED ORDER — CEPHALEXIN 500 MG PO CAPS: 500.0000 mg | ORAL_CAPSULE | Freq: Two times a day (BID) | ORAL | 0 refills | Status: DC

## 2021-11-20 MED ORDER — CEPHALEXIN 500 MG PO CAPS
500.0000 mg | ORAL_CAPSULE | Freq: Once | ORAL | Status: AC
  Administered 2021-11-20: 500 mg via ORAL
  Filled 2021-11-20: qty 1

## 2021-11-23 ENCOUNTER — Ambulatory Visit: Payer: Medicare HMO | Admitting: Dermatology

## 2021-11-24 ENCOUNTER — Other Ambulatory Visit: Payer: Self-pay | Admitting: Family Medicine

## 2021-11-24 DIAGNOSIS — E1159 Type 2 diabetes mellitus with other circulatory complications: Secondary | ICD-10-CM

## 2021-11-24 DIAGNOSIS — R040 Epistaxis: Secondary | ICD-10-CM | POA: Diagnosis not present

## 2021-11-30 DIAGNOSIS — H353211 Exudative age-related macular degeneration, right eye, with active choroidal neovascularization: Secondary | ICD-10-CM | POA: Diagnosis not present

## 2021-11-30 DIAGNOSIS — H353124 Nonexudative age-related macular degeneration, left eye, advanced atrophic with subfoveal involvement: Secondary | ICD-10-CM | POA: Diagnosis not present

## 2021-11-30 DIAGNOSIS — H43813 Vitreous degeneration, bilateral: Secondary | ICD-10-CM | POA: Diagnosis not present

## 2021-12-02 DIAGNOSIS — E1159 Type 2 diabetes mellitus with other circulatory complications: Secondary | ICD-10-CM | POA: Diagnosis not present

## 2021-12-02 DIAGNOSIS — E785 Hyperlipidemia, unspecified: Secondary | ICD-10-CM

## 2021-12-02 DIAGNOSIS — E1122 Type 2 diabetes mellitus with diabetic chronic kidney disease: Secondary | ICD-10-CM

## 2021-12-02 DIAGNOSIS — I2510 Atherosclerotic heart disease of native coronary artery without angina pectoris: Secondary | ICD-10-CM

## 2021-12-02 DIAGNOSIS — I129 Hypertensive chronic kidney disease with stage 1 through stage 4 chronic kidney disease, or unspecified chronic kidney disease: Secondary | ICD-10-CM | POA: Diagnosis not present

## 2021-12-02 DIAGNOSIS — N1832 Chronic kidney disease, stage 3b: Secondary | ICD-10-CM

## 2021-12-08 ENCOUNTER — Telehealth: Payer: Self-pay

## 2021-12-08 NOTE — Chronic Care Management (AMB) (Addendum)
Chronic Care Management Pharmacy Assistant   Name: [name]  MRN: [id number] DOB: 1930/12/21   Reason for Encounter: Reminder Call- Hospital Follow Up    Conditions to be addressed/monitored: CAD, HTN, HLD, and DMII   Medications: Outpatient Encounter Medications as of 12/08/2021  Medication Sig   aspirin EC 81 MG tablet Take 81 mg by mouth daily as needed for moderate pain.   atorvastatin (LIPITOR) 20 MG tablet Take 1 tablet (20 mg total) by mouth daily.   cephALEXin (KEFLEX) 500 MG capsule Take 1 capsule (500 mg total) by mouth 2 (two) times daily.   Continuous Blood Gluc Receiver (FREESTYLE LIBRE 2 READER) DEVI Use to check blood sugar continous   Continuous Blood Gluc Sensor (FREESTYLE LIBRE 2 SENSOR) MISC Use to check blood sugar continuous   donepezil (ARICEPT) 10 MG tablet TAKE 1 TABLET BY MOUTH EVERYDAY AT BEDTIME   ferrous sulfate 325 (65 FE) MG tablet Take 1 tablet (325 mg total) by mouth daily with breakfast.   glipiZIDE (GLUCOTROL) 10 MG tablet Take 1 tablet (10 mg total) by mouth daily before breakfast.   Multiple Vitamins-Minerals (ICAPS LUTEIN & ZEAXANTHIN PO) Take 1 capsule by mouth in the morning and at bedtime. I - Caps   Multiple Vitamins-Minerals (ICAPS) CAPS Take by mouth.   pantoprazole (PROTONIX) 40 MG tablet Take 1 tablet (40 mg total) by mouth daily.   Propylene Glycol (SYSTANE BALANCE) 0.6 % SOLN Apply 1 drop to eye 2 (two) times daily as needed (dry eyes).   Semaglutide,0.25 or 0.5MG /DOS, (OZEMPIC, 0.25 OR 0.5 MG/DOSE,) 2 MG/3ML SOPN 0.5 mg by Other route once a week. Inject 0.25 mg once a week for 4 weeks, then increase to 0.5 mg once a week   sertraline (ZOLOFT) 100 MG tablet Take 2 tablets (200 mg total) by mouth daily.   tamsulosin (FLOMAX) 0.4 MG CAPS capsule Take 1 capsule (0.4 mg total) by mouth daily after supper.   triamcinolone cream (KENALOG) 0.1 % Apply 1 application topically 2 (two) times daily.   vitamin B-12 (CYANOCOBALAMIN) 1000 MCG  tablet Take 1 tablet (1,000 mcg total) by mouth daily.   No facility-administered encounter medications on file as of 12/08/2021.    [name] was contacted to remind of upcoming telephone visit with Charlene Brooke on 12/13/21 at 3:00. Patient was reminded to have any blood glucose and blood pressure readings available for review at appointment.   Patient confirmed appointment.  Are you having any problems with your medications? No   Do you have any concerns you like to discuss with the pharmacist? No  CCM referral has been placed prior to visit?  Yes   Star Rating Drugs: Medication:  Last Fill: Day Supply Atorvastatin 20mg  10/16/21 90 Glipizide 10mg  11/24/21 90 Ozempic  10/13/21 84     Reviewed hospital notes for details of recent visit. Patient has been contacted by Transitions of Care team: No  Admitted to the ED on 11/19/21. Discharge date was 11/20/21.  Discharged from [facility] ED. Discharge diagnosis (Principal Problem): Epistaxis Patient was discharged to Home  Brief summary of hospital course: Patient on aspirin here for evaluation of right-sided epistaxis.  Patient with a small amount of bleeding from the right nare.  Afrin and lidocaine were placed with no clear source of bleeding after the nostril was cleared.  He did have some persistent bleeding down the back of his throat after cleaning the area.  A Rhino Rocket was placed and he was  observed for an hour without recurrent bleeding.  Reviewed records and labs from July 19.  Patient with renal insufficiency, stable at that time.  CBC with mild anemia, very mild thrombocytopenia.  Patient without systemic symptoms since recent lab draw, do not feel additional labs are needed at this time as bleeding was well controlled with [employer].  Plan to discharge home with prophylactic antibiotics, ENT follow-up.  New?Medications Started at [facility] Discharge:?? -Started  Cephalexin 500mg  take twice daily     Next CCM  appt: 12/13/21  Other upcoming appts: Endocrinology appointment on 12/29/21 Otolaryngolo consult  11/24/21  Recommendations/Changes made from today's visit: -Advised to stop fish oil due to excess bleeding risk; advised pt to contact PCP office if he has another nosebleed -Recommend repeat lipid panel at next OV   Plan: -[name] will call patient 2 weeks for BP log -Pharmacist follow up televisit scheduled for 6 weeks -PCP 10/20/21; endocrine 09/29/21  Charlene Brooke, PharmD notified and will determine if action is needed.   Avel Sensor, [place]  [phone]

## 2021-12-13 ENCOUNTER — Ambulatory Visit (INDEPENDENT_AMBULATORY_CARE_PROVIDER_SITE_OTHER): Payer: Medicare HMO | Admitting: Pharmacist

## 2021-12-13 DIAGNOSIS — I10 Essential (primary) hypertension: Secondary | ICD-10-CM

## 2021-12-13 DIAGNOSIS — E1159 Type 2 diabetes mellitus with other circulatory complications: Secondary | ICD-10-CM

## 2021-12-13 DIAGNOSIS — E1122 Type 2 diabetes mellitus with diabetic chronic kidney disease: Secondary | ICD-10-CM

## 2021-12-13 DIAGNOSIS — Z8673 Personal history of transient ischemic attack (TIA), and cerebral infarction without residual deficits: Secondary | ICD-10-CM

## 2021-12-13 DIAGNOSIS — I2510 Atherosclerotic heart disease of native coronary artery without angina pectoris: Secondary | ICD-10-CM

## 2021-12-13 DIAGNOSIS — E7800 Pure hypercholesterolemia, unspecified: Secondary | ICD-10-CM

## 2021-12-13 NOTE — Patient Instructions (Signed)
Visit Information  Phone number for Pharmacist: [phone]   Goals Addressed   None     Care Plan : [place]  Updates made by Charlton Haws, RPH since 12/13/2021 12:00 AM     Problem: Hypertension, Hyperlipidemia, Diabetes, Coronary Artery Disease, Chronic Kidney Disease, and Depression, Hx CVA, dementia   Priority: High     Long-Range Goal: Disease Management   Start Date: 09/03/2020  Expected End Date: 09/21/2022  Recent Progress: On track  Priority: High  Note:   Current Barriers:  None identified  Pharmacist Clinical Goal(s):  Patient will contact provider office for questions/concerns as evidenced notation of same in electronic health record through collaboration with PharmD and provider.   Interventions: 1:1 collaboration with [name], MD regarding development and update of comprehensive plan of care as evidenced by provider attestation and co-signature Inter-disciplinary care team collaboration (see longitudinal plan of care) Comprehensive medication review performed; medication list updated in electronic medical record  Hyperlipidemia / CAD (LDL goal < 70) -Query controlled - LDL 83 (03/2021) prior to statin; lipids have not been re-checked since re-starting atorvastatin after CVA -Hx CAD; cath 2014; hx CVA 03/2021 -Current treatment: Atorvastatin 20 mg daily - Appropriate, Query Effective Aspirin 81 mg daily - Appropriate, Effective, Safe, Accessible OTC fish oil 100 mg BID - Appropriate, Effective, Safe, Accessible -Medications previously tried: atorvastatin 40 mg (pt self-dc'd 2021)  -Educated on Cholesterol goals; Benefits of statin for ASCVD risk reduction; -Advised to stop fish oil due to recent nosebleeds (see below)  Hypertension (BP goal <140/90) -Query controlled - BP elevated at home, at goal in office visit -Current home readings: 126/74, 77 -Denies hypotensive/hypertensive symptoms -Drinks coffee - 1 cup/day -Current  treatment: None  -Medications previously tried: none reported  -Educated on Importance of home blood pressure monitoring;Proper BP monitoring technique; -Counseled to monitor BP at home daily  Diabetes (A1c goal <8%) -Controlled - A1c 7.2% (09/2021) BG at home suggests worse control than this -Neurology has recommended A1c goal < 6.5% for secondary prevention of stroke. -Established with endocrine (Dr Kelton Pillar) 09/2021 -Current home glucose readings [facility]): avg 186 -Current medications: Glipizide 10 mg daily -Appropriate, Query Effective Ozempic 0.5 mg weekly - Appropriate, Query Effective Freestyle Libre 2 w/ reader - Appropriate, Effective, Safe, Accessible -Medications previously tried: metformin (GI); Januvia -Educated on A1c and blood sugar goals;  -Recommend to continue current medication  Chronic Kidney Disease Stage 3b  -All medications assessed for renal dosing and appropriateness in chronic kidney disease. -Recommended to continue current medication  Depression/Anxiety (Goal: Improve mood) -Controlled - per patient report; dose increased 07/2021 -Current treatment: Sertraline 100 mg - 2 tab daily  - Appropriate, Effective, Safe, Accessible -Medications previously tried/failed: none reported -Recommended to continue current medication  Memory impairment (Goal: slow progression) -Controlled -Current treatment  Donepezil 10 mg daily HS - Appropriate, Effective, Safe, Accessible -Medications previously tried: n/a  -Recommended to continue current medication  Health Maintenance -Vaccine gaps: Shingrix -Current therapy:  Ferrous sulfate 325 mg daily  Vitamin B12 1000 mcg ICaps  Patient Goals/Self-Care Activities Patient will:  - take medications as prescribed as evidenced by patient report and record review focus on medication adherence by routine check glucose daily, document, and provide at future appointments check blood pressure daily, document, and provide at  future appointments      Patient verbalizes understanding of instructions and care plan provided today and agrees to view in [facility]. Active MyChart status and patient understanding of how to  access instructions and care plan via MyChart confirmed with patient.    Telephone follow up appointment with pharmacy team member scheduled for: 6 months  Charlene Brooke, PharmD, [facility] Clinical Pharmacist [facility] [phone]

## 2021-12-13 NOTE — Progress Notes (Signed)
Chronic Care Management Pharmacy Note  12/13/2021 Name:  [name] MRN:  [id number] DOB:  03/12/1931  Summary: CCM F/U visit -DM: A1c 7.2% (09/2021), improved; avg BG 189 per freestyle libre. -Hx CVA: LDL 83 (03/2021) prior to re-starting atorvastatin, goal LDL < 70 given recent CVA; lipids have not be re-checked yet  Recommendations/Changes made from today's visit: -No med changes -Recommend repeat lipid panel at next OV  Plan: -[place] will call patient 3 months for BP update -Pharmacist follow up televisit scheduled for 6 months -PCP 04/25/22; endocrine 12/29/21    Subjective: [name] is an 88 y.o. year old male who is a primary patient of Copland, Frederico Hamman, MD.  The CCM team was consulted for assistance with disease management and care coordination needs.    Engaged with patient by telephone for follow up visit in response to provider referral for pharmacy case management and/or care coordination services.   Consent to Services:  The patient was given information about Chronic Care Management services, agreed to services, and gave verbal consent prior to initiation of services.  Please see initial visit note for detailed documentation.   Patient Care Team: [name], MD as PCP - General (Family Medicine) Sherren Mocha, MD as PCP - Cardiology (Cardiology) Lavonna Monarch, MD as Consulting Physician (Dermatology) Charlton Haws, [facility] as Pharmacist (Pharmacist)  Recent office visits: 10/20/21 Dr Lorelei Pont OV: f/u - BMP, CBC stable, GFR slightly down. 07/19/21 Dr Lorelei Pont OV: f/u - depression/anxiety worse since stroke. C/o dysuria, constipation. Ordered MRI brain. Increased sertraline to 200 mg/day.  06/02/21 Dr Lorelei Pont OV: ER follow up (stroke sx) - improving. A1c 8.3%, increase glipizide to 10 mg BID. Increase sertraline to 150 mg. Rx triamcinolone cream for rash.  05/05/21 TE - ordered Freestyle Libre per pt request.  04/15/21 Dr Lorelei Pont OV:  hospital f/u (Acute CVA) - increase glipizide 5 mg to BID. 04/12/21 pt message - referral to endocrine.  Recent consult visits: 11/24/21 Dr Fredric Dine (ENT): Epistaxis. Merisel packing removed. Nosebleed precautions reviewed.  09/29/21 Dr Kelton Pillar (Endocrine): new pt - A1c 7.2%; decrease glipizide to 10 mg daily, continue Ozempic. Goal to prevent hypoglcyemia  05/27/21 Dr Leonie Man (neurology): new pt - stroke. Continue aspirin 81 mg. BP goal < 130/90. A1c goal < 6.5%. LDL goal < 70.   05/18/21 Dr Letta Pate (phys med): f/u stroke. No changes.  Hospital visits: 11/19/21 ED visit ([facility]): R epistaxis. Discharged with ppx Keflex.  05/19/21 ED visit [facility]): L sided weakness. CT scan wnl. Outpatient f/u. 03/12/21-04/02/21 inpatient rehab([facility]): acute CVA. 03/07/21-03/12/21 admission [facility]): acute CVA. No embolic source. Rec aspirin + Plavix for 3 weeks then aspirin alone.    Objective:  Lab Results  Component Value Date   CREATININE 1.81 (H) 10/20/2021   BUN 37 (H) 10/20/2021   GFR 32.85 (L) 10/20/2021   EGFR 44 (L) 07/30/2015   GFRNONAA 46 (L) 05/19/2021   GFRAA 58 (L) 06/25/2018   NA 141 10/20/2021   K 5.2 No hemolysis seen (H) 10/20/2021   CALCIUM 9.0 10/20/2021   CO2 26 10/20/2021   GLUCOSE 163 (H) 10/20/2021    Lab Results  Component Value Date/Time   HGBA1C 7.2 (A) 09/29/2021 02:54 PM   HGBA1C 8.3 (H) 03/08/2021 05:53 AM   HGBA1C 7.3 (H) 05/11/2020 10:31 AM   GFR 32.85 (L) 10/20/2021 02:49 PM   GFR 35.74 (L) 07/19/2021 12:44 PM   MICROALBUR 7.8 (H) 12/20/2019 09:20 AM   MICROALBUR 3.4 (H) 10/31/2018 10:39 AM  Last diabetic Eye exam:  Lab Results  Component Value Date/Time   HMDIABEYEEXA Retinopathy (A) 04/24/2019 12:00 AM    Last diabetic Foot exam:  Lab Results  Component Value Date/Time   HMDIABFOOTEX done 08/09/2012 12:00 AM     Lab Results  Component Value Date   CHOL 122 03/08/2021   HDL 17 (L) 03/08/2021   LDLCALC 83 03/08/2021   LDLDIRECT 130.0 12/20/2019   TRIG  109 03/08/2021   CHOLHDL 7.2 03/08/2021       Latest Ref Rng & Units 10/20/2021    2:49 PM 07/19/2021   12:44 PM 05/19/2021    2:44 PM  Hepatic Function  Total Protein 6.0 - 8.3 g/dL 6.7  6.6  6.1   Albumin 3.5 - 5.2 g/dL 4.4  4.1  3.6   AST 0 - 37 U/L _0 ALT 0 - 53 U/L _1 Alk Phosphatase 39 - 117 U/L 87  86  74   Total Bilirubin 0.2 - 1.2 mg/dL 0.3  0.4  0.4   Bilirubin, Direct 0.0 - 0.3 mg/dL 0.1  0.1      Lab Results  Component Value Date/Time   TSH 2.16 10/20/2021 02:49 PM   TSH 1.789 03/10/2021 12:57 AM   TSH 1.110 04/19/2011 01:29 PM       Latest Ref Rng & Units 10/20/2021    2:49 PM 07/19/2021   12:44 PM 05/19/2021    2:51 PM  CBC  WBC 4.0 - 10.5 K/uL 9.0  6.5    Hemoglobin 13.0 - 17.0 g/dL 11.0  12.8  12.9   Hematocrit 39.0 - 52.0 % 34.0  38.0  38.0   Platelets 150.0 - 400.0 K/uL 133.0  123.0     Iron/TIBC/Ferritin/ %Sat    Component Value Date/Time   IRON 77 03/10/2021 0057   TIBC 342 03/10/2021 0057   FERRITIN 42 03/10/2021 0057   IRONPCTSAT 23 03/10/2021 0057    No results found for: "VD25OH"  Clinical ASCVD: Yes  The ASCVD Risk score (Arnett DK, et al., 2019) failed to calculate for the following reasons:   The 2019 ASCVD risk score is only valid for ages 63 to 71   The patient has a prior MI or stroke diagnosis       08/17/2021    2:06 PM 08/10/2021    2:14 PM 05/18/2021    2:00 PM  Depression screen PHQ 2/9  Decreased Interest 0 0 0  Down, Depressed, Hopeless 1 0 0  PHQ - 2 Score 1 0 0     Social History   Tobacco Use  Smoking Status Former   Packs/day: 2.00   Years: 42.00   Total pack years: 84.00   Types: Cigarettes   Start date: 02/25/1953   Quit date: 04/04/1984   Years since quitting: 37.7  Smokeless Tobacco Former   Types: Chew   Quit date: 07/27/1986  Tobacco Comments   quit tobacco 26 years ago   BP Readings from Last 3 Encounters:  11/20/21 (!) 159/93  10/20/21 118/60  09/29/21 140/80   Pulse Readings  from Last 3 Encounters:  11/20/21 98  10/20/21 88  09/29/21 76   Wt Readings from Last 3 Encounters:  11/19/21 181 lb 7 oz (82.3 kg)  10/20/21 181 lb 6 oz (82.3 kg)  09/29/21 182 lb (82.6 kg)   BMI Readings from Last 3 Encounters:  11/19/21 26.79 kg/m  10/20/21 26.78 kg/m  09/29/21  26.88 kg/m    Assessment/Interventions: Review of patient past medical history, allergies, medications, health status, including review of consultants reports, laboratory and other test data, was performed as part of comprehensive evaluation and provision of chronic care management services.   SDOH:  (Social Determinants of Health) assessments and interventions performed: No - done May 2023 SDOH Interventions    Flowsheet Row Clinical Support from 08/10/2021 in [place] at [place] Management from 09/03/2020 in [place] at [place] from 09/03/2018 in [place] at [place] Interventions Intervention Not Indicated -- --  Housing Interventions Intervention Not Indicated -- --  Transportation Interventions Intervention Not Indicated -- --  Depression Interventions/Treatment  -- -- WYO3-7 Score <4 Follow-up Not Indicated  Financial Strain Interventions Intervention Not Indicated Intervention Not Indicated --  Physical Activity Interventions Intervention Not Indicated -- --  Stress Interventions Intervention Not Indicated -- --  Social Connections Interventions Intervention Not Indicated -- --      SDOH Screenings   Food Insecurity: No Food Insecurity (08/10/2021)  Housing: Low Risk  (08/10/2021)  Transportation Needs: No Transportation Needs (08/10/2021)  Alcohol Screen: Low Risk  (08/10/2021)  Depression (PHQ2-9): Low Risk  (08/17/2021)  Financial Resource Strain: Low Risk  (08/10/2021)  Physical Activity: Insufficiently Active (08/10/2021)  Social Connections: Moderately Isolated (08/10/2021)  Stress: No  Stress Concern Present (08/10/2021)  Tobacco Use: Medium Risk (11/19/2021)    CCM Care Plan  Allergies  Allergen Reactions   Sulfa Antibiotics Nausea Only    Medications Reviewed Today     Reviewed by Charlton Haws, [facility] (Pharmacist) on 12/13/21 at 1520  Med List Status: <None>   Medication Order Taking? Sig Documenting Provider Last Dose Status Informant  aspirin EC 81 MG tablet [id number] Yes Take 81 mg by mouth daily as needed for moderate pain. [provider] Taking Active Spouse/Significant Other  atorvastatin (LIPITOR) 20 MG tablet [id number] Yes Take 1 tablet (20 mg total) by mouth daily. [name], MD Taking Active   Continuous Blood Gluc Receiver (FREESTYLE LIBRE 2 READER) [place] [id number] Yes Use to check blood sugar continous Copland, Frederico Hamman, MD Taking Active   Continuous Blood Gluc Sensor (FREESTYLE LIBRE 2 SENSOR) [place] [id number] Yes Use to check blood sugar continuous Copland, Frederico Hamman, MD Taking Active   donepezil (ARICEPT) 10 MG tablet [id number] Yes TAKE 1 TABLET BY MOUTH EVERYDAY AT BEDTIME Copland, Spencer, MD Taking Active   ferrous sulfate 325 (65 FE) MG tablet [id number] Yes Take 1 tablet (325 mg total) by mouth daily with breakfast. Copland, Spencer, MD Taking Active   glipiZIDE (GLUCOTROL) 10 MG tablet [id number] Yes Take 1 tablet (10 mg total) by mouth daily before breakfast. Copland, Spencer, MD Taking Active   Multiple Vitamins-Minerals (ICAPS LUTEIN & ZEAXANTHIN PO) [id number] Yes Take 1 capsule by mouth in the morning and at bedtime. I - Caps [provider] Taking Active Spouse/Significant Other  Multiple Vitamins-Minerals ([place]) CAPS [id number] Yes Take by mouth. [provider] Taking Active   pantoprazole (PROTONIX) 40 MG tablet [id number] Yes Take 1 tablet (40 mg total) by mouth daily. Copland, Frederico Hamman, MD Taking Active   Propylene Glycol (SYSTANE BALANCE) 0.6 % SOLN [id number] Yes Apply 1 drop to eye 2 (two) times daily as needed  (dry eyes). [provider] Taking Active Spouse/Significant Other  Semaglutide,0.25 or 0.5MG/DOS, (OZEMPIC, 0.25 OR 0.5 MG/DOSE,) 2 MG/3ML SOPN [id number] Yes 0.5 mg by Other route once a week.  Inject 0.25 mg once a week for 4 weeks, then increase to 0.5 mg once a week Shamleffer, Melanie Crazier, MD Taking Active   sertraline (ZOLOFT) 100 MG tablet [id number] Yes Take 2 tablets (200 mg total) by mouth daily. Copland, Frederico Hamman, MD Taking Active   tamsulosin [facility]) 0.4 MG CAPS capsule [id number] Yes Take 1 capsule (0.4 mg total) by mouth daily after supper. Copland, Frederico Hamman, MD Taking Active   triamcinolone cream (KENALOG) 0.1 % [id number] Yes Apply 1 application topically 2 (two) times daily. Copland, Frederico Hamman, MD Taking Active   vitamin B-12 (CYANOCOBALAMIN) 1000 MCG tablet [id number] Yes Take 1 tablet (1,000 mcg total) by mouth daily. [name], MD Taking Active             Patient Active Problem List   Diagnosis Date Noted   Right pontine cerebrovascular accident ([place]) 03/12/2021   GERD (gastroesophageal reflux disease) 03/08/2021   Sensorineural hearing loss (SNHL) of both ears 05/15/2017   Macular degeneration of both eyes 01/21/2014   Tobacco abuse, 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/08/2013   CAD (coronary artery disease)    Pericardial tamponade 09/17/2012   Acute pericarditis, unspecified 09/13/2012   Adenocarcinoma of left lung, stage 1 ([place]) 07/25/2011   Cancer of prostate w/med recur risk (T2b-c or Gleason 7 or PSA 10-20) ([place]) 07/25/2011   Nonmelanoma skin cancer 07/25/2011   History of colonic polyps 07/25/2011   Type 2 diabetes mellitus with vascular disease ([place]) 05/21/2007   HYPERCHOLESTEROLEMIA 05/21/2007   CARCINOMA, SKIN, SQUAMOUS CELL 01/08/2007   Generalized anxiety disorder 01/08/2007   ALZHEIMER'S DISEASE, MILD 01/08/2007   Essential hypertension 01/08/2007    Immunization History  Administered Date(s) Administered   Fluad Quad(high Dose 65+) 12/26/2019,  03/13/2021   Influenza Whole 01/03/2007   Influenza, High Dose Seasonal PF 01/19/2018, 03/06/2019   Influenza,inj,Quad PF,6+ Mos 02/06/2013, 01/20/2014, 01/15/2015, 01/29/2016   Influenza-Unspecified 01/02/2017   Moderna Sars-Covid-2 Vaccination 06/03/2019, 07/04/2019, 03/19/2020   Pneumococcal Conjugate-13 01/20/2014   Pneumococcal Polysaccharide-23 01/02/2001   Td 04/04/1994   Tdap 01/25/2013    Conditions to be addressed/monitored:  Hypertension, Hyperlipidemia, Diabetes, Coronary Artery Disease, Chronic Kidney Disease, and Depression, Hx CVA, dementia  Care Plan : CCM Pharmacy Care Plan  Updates made by Charlton Haws, [place] since 12/13/2021 12:00 AM     Problem: Hypertension, Hyperlipidemia, Diabetes, Coronary Artery Disease, Chronic Kidney Disease, and Depression, Hx CVA, dementia   Priority: High     Long-Range Goal: Disease Management   Start Date: 09/03/2020  Expected End Date: 09/21/2022  Recent Progress: On track  Priority: High  Note:   Current Barriers:  None identified  Pharmacist Clinical Goal(s):  Patient will contact provider office for questions/concerns as evidenced notation of same in electronic health record through collaboration with PharmD and provider.   Interventions: 1:1 collaboration with [name], MD regarding development and update of comprehensive plan of care as evidenced by provider attestation and co-signature Inter-disciplinary care team collaboration (see longitudinal plan of care) Comprehensive medication review performed; medication list updated in electronic medical record  Hyperlipidemia / CAD (LDL goal < 70) -Query controlled - LDL 83 (03/2021) prior to statin; lipids have not been re-checked since re-starting atorvastatin after CVA -Hx CAD; cath 2014; hx CVA 03/2021 -Current treatment: Atorvastatin 20 mg daily - Appropriate, Query Effective Aspirin 81 mg daily - Appropriate, Effective, Safe, Accessible OTC fish oil 100 mg  BID - Appropriate, Effective, Safe, Accessible -Medications previously tried: atorvastatin 40 mg (pt self-dc'd 2021)  -Educated on Cholesterol goals; Benefits of  statin for ASCVD risk reduction; -Advised to stop fish oil due to recent nosebleeds (see below)  Hypertension (BP goal <140/90) -Query controlled - BP elevated at home, at goal in office visit -Current home readings: 126/74, 77 -Denies hypotensive/hypertensive symptoms -Drinks coffee - 1 cup/day -Current treatment: None  -Medications previously tried: none reported  -Educated on Importance of home blood pressure monitoring;Proper BP monitoring technique; -Counseled to monitor BP at home daily  Diabetes (A1c goal <8%) -Controlled - A1c 7.2% (09/2021) BG at home suggests worse control than this -Neurology has recommended A1c goal < 6.5% for secondary prevention of stroke. -Established with endocrine (Dr Kelton Pillar) 09/2021 -Current home glucose readings [facility]): avg 186 -Current medications: Glipizide 10 mg daily -Appropriate, Query Effective Ozempic 0.5 mg weekly - Appropriate, Query Effective Freestyle Libre 2 w/ reader - Appropriate, Effective, Safe, Accessible -Medications previously tried: metformin (GI); Januvia -Educated on A1c and blood sugar goals;  -Recommend to continue current medication  Chronic Kidney Disease Stage 3b  -All medications assessed for renal dosing and appropriateness in chronic kidney disease. -Recommended to continue current medication  Depression/Anxiety (Goal: Improve mood) -Controlled - per patient report; dose increased 07/2021 -Current treatment: Sertraline 100 mg - 2 tab daily  - Appropriate, Effective, Safe, Accessible -Medications previously tried/failed: none reported -Recommended to continue current medication  Memory impairment (Goal: slow progression) -Controlled -Current treatment  Donepezil 10 mg daily HS - Appropriate, Effective, Safe, Accessible -Medications previously  tried: n/a  -Recommended to continue current medication  Health Maintenance -Vaccine gaps: Shingrix -Current therapy:  Ferrous sulfate 325 mg daily  Vitamin B12 1000 mcg ICaps  Patient Goals/Self-Care Activities Patient will:  - take medications as prescribed as evidenced by patient report and record review focus on medication adherence by routine check glucose daily, document, and provide at future appointments check blood pressure daily, document, and provide at future appointments      Medication Assistance: None required.  Patient affirms current coverage meets needs.  Compliance/Adherence/Medication fill history: Care Gaps: Eye exam (due 04/23/20) Urine MA (due 12/19/20)  Star-Rating Drugs: Atorvastatin - PDC 100% Glipizide - PDC 100%  Medication Access: Within the past 30 days, how often has patient missed a dose of medication? 0 Is a pillbox or other method used to improve adherence? Yes  Factors that may affect medication adherence? no barriers identified Are meds synced by current pharmacy? No  Are meds delivered by current pharmacy? No  Does patient experience delays in picking up medications due to transportation concerns? No   [facility] Services Reviewed: Is patient disadvantaged to use [facility]?: No  Current Rx insurance plan: [employer] [place] Name and location of Current pharmacy:  [id number] [place] [address] [place] [address]Phone: [phone]: [phone] [facility] services reviewed with patient today?: No  Patient requests to transfer care to [facility]?: No  Reason patient declined to change pharmacies: Not mentioned at this visit   Care Plan and Follow Up Patient Decision:  Patient agrees to Care Plan and Follow-up.  Plan: Telephone follow up appointment with care management team member scheduled for:  6 months  LCharlene Brooke PharmD, BCACP Clinical Pharmacist [facility] at [phone]

## 2021-12-27 DIAGNOSIS — E119 Type 2 diabetes mellitus without complications: Secondary | ICD-10-CM | POA: Diagnosis not present

## 2021-12-29 ENCOUNTER — Ambulatory Visit: Payer: Medicare HMO | Admitting: Internal Medicine

## 2022-01-01 DIAGNOSIS — E1159 Type 2 diabetes mellitus with other circulatory complications: Secondary | ICD-10-CM | POA: Diagnosis not present

## 2022-01-01 DIAGNOSIS — E7800 Pure hypercholesterolemia, unspecified: Secondary | ICD-10-CM | POA: Diagnosis not present

## 2022-01-01 DIAGNOSIS — I2510 Atherosclerotic heart disease of native coronary artery without angina pectoris: Secondary | ICD-10-CM

## 2022-01-01 DIAGNOSIS — E1122 Type 2 diabetes mellitus with diabetic chronic kidney disease: Secondary | ICD-10-CM

## 2022-01-01 DIAGNOSIS — Z7985 Long-term (current) use of injectable non-insulin antidiabetic drugs: Secondary | ICD-10-CM

## 2022-01-01 DIAGNOSIS — I129 Hypertensive chronic kidney disease with stage 1 through stage 4 chronic kidney disease, or unspecified chronic kidney disease: Secondary | ICD-10-CM

## 2022-01-01 DIAGNOSIS — N1832 Chronic kidney disease, stage 3b: Secondary | ICD-10-CM | POA: Diagnosis not present

## 2022-01-14 ENCOUNTER — Ambulatory Visit (INDEPENDENT_AMBULATORY_CARE_PROVIDER_SITE_OTHER): Payer: Medicare HMO | Admitting: Internal Medicine

## 2022-01-14 ENCOUNTER — Encounter: Payer: Self-pay | Admitting: Internal Medicine

## 2022-01-14 VITALS — BP 138/76 | HR 71 | Ht 69.0 in | Wt 182.0 lb

## 2022-01-14 DIAGNOSIS — Z23 Encounter for immunization: Secondary | ICD-10-CM

## 2022-01-14 DIAGNOSIS — E1159 Type 2 diabetes mellitus with other circulatory complications: Secondary | ICD-10-CM | POA: Diagnosis not present

## 2022-01-14 LAB — POCT GLYCOSYLATED HEMOGLOBIN (HGB A1C): Hemoglobin A1C: 6.4 % — AB (ref 4.0–5.6)

## 2022-01-14 MED ORDER — GLIPIZIDE 10 MG PO TABS: 10.0000 mg | ORAL_TABLET | Freq: Every day | ORAL | 1 refills | Status: DC

## 2022-01-14 MED ORDER — OZEMPIC (0.25 OR 0.5 MG/DOSE) 2 MG/3ML ~~LOC~~ SOPN: 0.5000 mg | PEN_INJECTOR | SUBCUTANEOUS | 3 refills | Status: DC

## 2022-01-14 NOTE — Progress Notes (Signed)
Name: [name]  MRN/ DOB: [id number], 09/21/1930   Age/ Sex: 88 y.o., male    PCP: [name], MD   Reason for Endocrinology Evaluation: Type 2 Diabetes Mellitus     Date of Initial Endocrinology Visit: 09/29/2021    PATIENT IDENTIFIER: [name] is a 88 y.o. male with a past medical history of DM, CKD III, CVA and dementia, Hx of prostate cancer. The patient presented for initial endocrinology clinic visit on 09/29/2021 for consultative assistance with his diabetes management.    HPI: [name]   Diagnosed with DM 2009 Prior Medications tried/Intolerance: Metformin- severe diarrhea. Ozempic started 09/2021 Hemoglobin A1c has ranged from 6.8% in 2019, peaking at 8.3% in 2022.    On his initial visit to our clinic his A1c was 7.2% he was on glipizide and was just recently started on Ozempic.  We continued the Ozempic but decrease the glipizide preemptively for hypoglycemia  SUBJECTIVE:   During the last visit (09/29/2021): A1c 7.2%  Today (01/14/22): [name] is here for a follow up on diabetes management.  He is accompanied by his son today.  He checks his blood sugars multiple times daily. The patient has not had hypoglycemic episodes since the last clinic visit.  Wife had to had an amputation  He denies any nausea, vomiting or diarrhea      HOME DIABETES REGIMEN: Glipizide 10 mg  Ozempic 0.5 mg weekly  (Thursdays)     CONTINUOUS GLUCOSE MONITORING RECORD INTERPRETATION    Dates of Recording: 9/30 - 01/14/2022  Sensor description:freestyle  Results statistics:   CGM use % of time 83  Average and SD 172/22.7  Time in range    65   %  % Time Above 180 31  % Time above 250 4  % Time Below target 0       Glycemic patterns summary: BG's optimal overnight with hyperglycemia noted during the day   Hyperglycemic episodes  postprandial   Hypoglycemic episodes occurred N/A  Overnight periods: Optimal for the most  part      DIABETIC COMPLICATIONS: Microvascular complications:  CKD III, macular degeneration  Denies: retinopathy, neuropathy  Last eye exam: Completed 2023  Macrovascular complications:  CVA, CAD Denies: CAD, PVD   PAST HISTORY: Past Medical History:  Past Medical History:  Diagnosis Date   Adenocarcinoma of left lung, stage 1 ([facility]) 2001   T1N0 stage I  adenoca left lung resected 01/03/00    Alzheimer's disease ([place])    CAD (coronary artery disease) 2014   a. 09/2012 Cath: LM nl, LAD 50-60p, D1 60-70 m, D1 50ost, LCX nl, RCA min irregs, EF 55-65%.   Generalized anxiety disorder 01/08/2007   Qualifier: Diagnosis of  By: Diona Browner MD, [name]     History of colonic polyps 07/25/2011   Macular degeneration of both eyes 01/21/2014   Nonmelanoma skin cancer 07/25/2011   Multiple lesions excised face/nose   Pericarditis    a. 09/2012 with effusion and tamponade, s/p window.  b.  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 SCC IN SITU TX CX3   SCC (squamous cell carcinoma) 04/12/2005   RIGHT NECK SUP. SCC IN SITU TX EXC   SCC (squamous cell carcinoma) 04/16/2013   LEFT TEMPLE SCC IN SITU TX CX3 5FU   SCC (squamous cell carcinoma) 04/16/2013   BELOW LEFT EYE SCC IN SITU TX CX3 5FU   SCC (squamous cell carcinoma) 04/16/2013   BELOW RIGHT EYE SCC IN SITU TX CX3 5FU   SCC  (squamous cell carcinoma) 08/04/2014   LEFT CHEEK SCC IN SITU TX CX3 5FU   SCC (squamous cell carcinoma) 11/27/2018   LEFT TEMPLE SCC IN SITU TX WITH BX   SCC (squamous cell carcinoma)    SCC (squamous cell carcinoma)    SCC (squamous cell carcinoma)    SCC (squamous cell carcinoma) Well Diff 09/13/2016   Tip of Nose SCC WELL DIFF TX (MOH's), and RIGHT INNER EYE SUP. SCC IN SITU TX TO WATCH   SCC (squamous cell carcinoma) Well Diff 05/30/2017   Right Cheekbone (Cx3,5FU) and Under Left Eye (Cx3,5FU)   Squamous cell carcinoma in situ (SCCIS) 04/12/2005   Right Neck Inf (Cx3), Right Neck Sup (Exc), and Bulb of Nose (Cx3,Exc)   Squamous cell carcinoma in situ (SCCIS) 09/27/2005   Nose (Cx3,Exc)   Squamous cell carcinoma in situ (SCCIS) 02/18/2013   Below Left Eye (tx p bx)   Squamous cell carcinoma in situ (SCCIS) 04/16/2013   Left Temple (Cx3,5FU), Below Left Eye (Cx3,5FU), Below Right Eye (Cx3,5FU)   Squamous cell carcinoma in situ (SCCIS) 08/04/2014   Left Cheek (Cx3,5FU)   Squamous cell carcinoma in situ (SCCIS) 09/13/2016   Right Inner Eye Sup. 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   Type 2 diabetes mellitus with vascular disease ([place]) 05/21/2007   Qualifier: Diagnosis of  By: Diona Browner MD, [name]     Type 2 DM with CKD stage 3 and hypertension ([place] [facility]) 07/16/2015   Unspecified essential hypertension    Past Surgical History:  Past Surgical History:  Procedure Laterality Date   HERNIA REPAIR     LEFT HEART CATHETERIZATION WITH CORONARY ANGIOGRAM N/A 09/13/2012   Procedure: LEFT HEART CATHETERIZATION WITH CORONARY ANGIOGRAM;  Surgeon: Sherren Mocha, MD;  Location: [facility] C[facility] LAB;  Service:  Cardiovascular;  Laterality: N/A;   LOBECTOMY  2001   upper left   PERICARDIAL TAP N/A 09/16/2012   Procedure: PERICARDIAL TAP;  Surgeon: Sinclair Grooms, MD;  Location: [facility] CATH LAB;  Service: Cardiovascular;  Laterality: N/A;   PROSTATECTOMY  2003   SUBXYPHOID PERICARDIAL WINDOW N/A 09/16/2012   Procedure: SUBXYPHOID PERICARDIAL WINDOW;  Surgeon: Rexene Alberts, MD;  Location: [facility];  Service: Thoracic;  Laterality: N/A;   TONSILLECTOMY      Social History:  reports that he quit smoking about 37 years ago. His smoking use included cigarettes. He  started smoking about 68 years ago. He has a 84.00 pack-year smoking history. He quit smokeless tobacco use about 35 years ago.  His smokeless tobacco use included chew. He reports that he does not drink alcohol and does not use drugs. Family History:  Family History  Problem Relation Age of Onset   Cancer Father        colon   Dementia Mother    Diabetes Mother    Cancer Brother        lung     HOME MEDICATIONS: Allergies as of 01/14/2022       Reactions   Sulfa Antibiotics Nausea Only        Medication List        Accurate as of January 14, 2022  3:55 PM. If you have any questions, ask your nurse or doctor.          aspirin EC 81 MG tablet Take 81 mg by mouth daily as needed for moderate pain.   atorvastatin 20 MG tablet Commonly known as: LIPITOR Take 1 tablet (20 mg total) by mouth daily.   cyanocobalamin 1000 MCG tablet Commonly known as: VITAMIN B12 Take 1 tablet (1,000 mcg total) by mouth daily.   donepezil 10 MG tablet Commonly known as: ARICEPT TAKE 1 TABLET BY MOUTH EVERYDAY AT BEDTIME   ferrous sulfate 325 (65 FE) MG tablet Take 1 tablet (325 mg total) by mouth daily with breakfast.   FreeStyle Libre 2 Reader Kerrin Mo Use to check blood sugar continous   FreeStyle Libre 2 Sensor Misc Use to check blood sugar continuous   glipiZIDE 10 MG tablet Commonly known as: GLUCOTROL Take 1 tablet (10 mg total)  by mouth daily before breakfast.   ICaps Caps Take by mouth.   ICAPS LUTEIN & ZEAXANTHIN PO Take 1 capsule by mouth in the morning and at bedtime. I - Caps   Ozempic (0.25 or 0.5 MG/DOSE) 2 MG/3ML Sopn Generic drug: Semaglutide(0.25 or 0.5MG /DOS) 0.5 mg by Other route once a week. Inject 0.25 mg once a week for 4 weeks, then increase to 0.5 mg once a week   pantoprazole 40 MG tablet Commonly known as: PROTONIX Take 1 tablet (40 mg total) by mouth daily.   sertraline 100 MG tablet Commonly known as: ZOLOFT Take 2 tablets (200 mg total) by mouth daily.   Systane Balance 0.6 % Soln Generic drug: Propylene Glycol Apply 1 drop to eye 2 (two) times daily as needed (dry eyes).   tamsulosin 0.4 MG Caps capsule Commonly known as: FLOMAX Take 1 capsule (0.4 mg total) by mouth daily after supper.   triamcinolone cream 0.1 % Commonly known as: KENALOG Apply 1 application topically 2 (two) times daily.         ALLERGIES: Allergies  Allergen Reactions   Sulfa Antibiotics Nausea Only     REVIEW OF SYSTEMS: A comprehensive ROS was conducted with the patient and is negative except as per HPI    OBJECTIVE:   VITAL SIGNS: BP 138/76 (BP Location: Right Arm, Patient Position: Sitting, Cuff Size: Small)   Pulse 71   Ht 5\' 9"  (1.753 m)   Wt 182 lb (82.6 kg)   SpO2 96%   BMI 26.88 kg/m    PHYSICAL EXAM:  General: Pt appears well and is in NAD In a wheelchair  Lungs: Clear with good BS bilat with no rales, rhonchi, or wheezes  Heart: RRR   Extremities:  trace pretibial edema  Neuro: MS is good with appropriate  affect, pt is alert and Ox3     DATA REVIEWED:  Lab Results  Component Value Date   HGBA1C 6.4 (A) 01/14/2022   HGBA1C 7.2 (A) 09/29/2021   HGBA1C 8.3 (H) 03/08/2021     Latest Reference Range & Units 10/20/21 14:49  Sodium 135 - 145 mEq/L 141  Potassium 3.5 - 5.1 mEq/L 5.2 No hemolysis seen (H)  Chloride 96 - 112 mEq/L 105  CO2 19 - 32 mEq/L 26  Glucose  70 - 99 mg/dL 163 (H)  BUN 6 - 23 mg/dL 37 (H)  Creatinine 0.40 - 1.50 mg/dL 1.81 (H)  Calcium 8.4 - 10.5 mg/dL 9.0  Alkaline Phosphatase 39 - 117 U/L 87  Albumin 3.5 - 5.2 g/dL 4.4  AST 0 - 37 U/L 10  ALT 0 - 53 U/L 13  Total Protein 6.0 - 8.3 g/dL 6.7  Bilirubin, Direct 0.0 - 0.3 mg/dL 0.1  Total Bilirubin 0.2 - 1.2 mg/dL 0.3  GFR >60.00 mL/min 32.85 (L)   ASSESSMENT / PLAN / RECOMMENDATIONS:   1) Type 2 Diabetes Mellitus, Optimally controlled, With CKD III and macrovascular  complications - Most recent A1c of 6.4 %. Goal A1c < 8.0 %.     -His A1c is skewed due to CKD, his A1c equivalent based on CGM data 7.4% -Patient is intolerant to metformin, as well as low GFR -No changes will be made today as there is no evidence of hypoglycemia on CGM download, however he has been noted with worsening glycemic control over the past 2 weeks, compared to earlier in the month. -Per son, patient has been having dietary indiscretions, we discussed the importance of low-carb diet and avoiding sweets as much as possible to reduce medication burden  MEDICATIONS: Continue glipizide 10 mg daily Continue Ozempic 0.5 mg weekly  EDUCATION / INSTRUCTIONS: BG monitoring instructions: Patient is instructed to check his blood sugars 3 times a day, before meals. Call [facility] if: BG persistently < 70  I reviewed the Rule of 15 for the treatment of hypoglycemia in detail with the patient. Literature supplied.   2) Diabetic complications:  Eye: Does not have known diabetic retinopathy.  Neuro/ Feet: Does not have known diabetic peripheral neuropathy. Renal: Patient does  have known baseline CKD. He is not on an ACEI/ARB at present.      Follow-up in 3 months     Signed electronically by: Mack Guise, MD  [facility] End[facility]in[facility]  [facility] [address] [place], [place] [address] Phone: [phone] FAX: [phone]   CC: [name], [place] [address] Phone: [phone]  Fax: [phone]    Return to Endocrinology clinic as below: Future Appointments  Date Time Provider [place]  02/22/2022  3:45 PM Gardiner Barefoot, DPM [facility]  04/25/2022  2:00 PM [name], MD [facility]  06/07/2022  3:00 PM [facility] CCM PHARMACIST [facility]  07/18/2022  1:20 PM Shaunae Sieloff, Melanie Crazier, MD [facility] None  08/12/2022  2:15 PM [facility] NURSE HEALTH ADVISOR [facility]

## 2022-01-17 ENCOUNTER — Encounter: Payer: Self-pay | Admitting: Internal Medicine

## 2022-01-28 ENCOUNTER — Ambulatory Visit: Payer: Medicare HMO | Admitting: Podiatry

## 2022-02-03 ENCOUNTER — Ambulatory Visit: Payer: Medicare HMO | Admitting: Podiatry

## 2022-02-14 ENCOUNTER — Ambulatory Visit: Payer: Medicare HMO | Admitting: Podiatry

## 2022-02-22 ENCOUNTER — Ambulatory Visit: Payer: Medicare HMO | Admitting: Podiatry

## 2022-03-03 ENCOUNTER — Ambulatory Visit (INDEPENDENT_AMBULATORY_CARE_PROVIDER_SITE_OTHER): Payer: Medicare HMO | Admitting: Podiatry

## 2022-03-03 ENCOUNTER — Encounter: Payer: Self-pay | Admitting: Podiatry

## 2022-03-03 DIAGNOSIS — B351 Tinea unguium: Secondary | ICD-10-CM | POA: Diagnosis not present

## 2022-03-03 DIAGNOSIS — E1151 Type 2 diabetes mellitus with diabetic peripheral angiopathy without gangrene: Secondary | ICD-10-CM

## 2022-03-03 DIAGNOSIS — M79674 Pain in right toe(s): Secondary | ICD-10-CM | POA: Diagnosis not present

## 2022-03-03 DIAGNOSIS — M79675 Pain in left toe(s): Secondary | ICD-10-CM | POA: Diagnosis not present

## 2022-03-03 NOTE — Progress Notes (Signed)
This patient returns to my office for at risk foot care.  This patient requires this care by a professional since this patient will be at risk due to having diabetes with vascular disease.  Patient also has chronic kidney disease.       This patient is unable to cut nails himself since the patient cannot reach his nails.These nails are painful walking and wearing shoes.  This patient presents for at risk foot care today.  General Appearance  Alert, conversant and in no acute stress.  Vascular  Dorsalis pedis  pulses are weakly  palpable  bilaterally. Posterior tibial pulses are absent  B/L.   Capillary return is within normal limits  bilaterally. Cold feet   bilaterally.  Neurologic  Senn-Weinstein monofilament wire test within normal limits  bilaterally. Muscle power within normal limits bilaterally.  Nails Thick disfigured discolored nails with subungual debris  from hallux to fifth toes bilaterally. No evidence of bacterial infection or drainage bilaterally.  Orthopedic  No limitations of motion  feet .  No crepitus or effusions noted.  No bony pathology or digital deformities noted.  Skin  normotropic skin with no porokeratosis noted bilaterally.  No signs of infections or ulcers noted.     Onychomycosis  Pain in right toes  Pain in left toes  Consent was obtained for treatment procedures.   Mechanical debridement of nails 1-5  bilaterally performed with a nail nipper.  Filed with dremel without incident.    Return office visit  prn                 Told patient to return for periodic foot care and evaluation due to potential at risk complications.   Gardiner Barefoot DPM

## 2022-03-08 ENCOUNTER — Telehealth: Payer: Self-pay

## 2022-03-08 DIAGNOSIS — H43813 Vitreous degeneration, bilateral: Secondary | ICD-10-CM | POA: Diagnosis not present

## 2022-03-08 DIAGNOSIS — H353123 Nonexudative age-related macular degeneration, left eye, advanced atrophic without subfoveal involvement: Secondary | ICD-10-CM | POA: Diagnosis not present

## 2022-03-08 DIAGNOSIS — H353211 Exudative age-related macular degeneration, right eye, with active choroidal neovascularization: Secondary | ICD-10-CM | POA: Diagnosis not present

## 2022-03-08 NOTE — Chronic Care Management (AMB) (Signed)
Chronic Care Management Pharmacy Assistant   Name: [name]  MRN: [id number] DOB: 1930/04/16   Reason for Encounter:Hypertension  Disease State  Unsuccessful outreach    Recent office visits:  None since last CCM contact  Recent consult visits:  03/03/22-[name],DPM- toenail trimming,f/u as needed 01/14/22-Ethan Jaralla,MD(endo)-f/u new A1c 6.4,reviewed CGM,UTD eye,UTD feet, f/u 3 months  Hospital visits:  None in previous 6 months  Medications: Outpatient Encounter Medications as of 03/08/2022  Medication Sig   aspirin EC 81 MG tablet Take 81 mg by mouth daily as needed for moderate pain.   atorvastatin (LIPITOR) 20 MG tablet Take 1 tablet (20 mg total) by mouth daily.   Continuous Blood Gluc Receiver (FREESTYLE LIBRE 2 READER) DEVI Use to check blood sugar continous   Continuous Blood Gluc Castillo (FREESTYLE LIBRE 2 Castillo) MISC Use to check blood sugar continuous   donepezil (ARICEPT) 10 MG tablet TAKE 1 TABLET BY MOUTH EVERYDAY AT BEDTIME   ferrous sulfate 325 (65 FE) MG tablet Take 1 tablet (325 mg total) by mouth daily with breakfast.   glipiZIDE (GLUCOTROL) 10 MG tablet Take 1 tablet (10 mg total) by mouth daily before breakfast.   Multiple Vitamins-Minerals (ICAPS LUTEIN & ZEAXANTHIN PO) Take 1 capsule by mouth in the morning and at bedtime. I - Caps   Multiple Vitamins-Minerals (ICAPS) CAPS Take by mouth.   pantoprazole (PROTONIX) 40 MG tablet Take 1 tablet (40 mg total) by mouth daily.   Propylene Glycol (SYSTANE BALANCE) 0.6 % SOLN Apply 1 drop to eye 2 (two) times daily as needed (dry eyes).   Semaglutide,0.25 or 0.5MG /DOS, (OZEMPIC, 0.25 OR 0.5 MG/DOSE,) 2 MG/3ML SOPN 0.5 mg by Other route once a week. Inject 0.25 mg once a week for 4 weeks, then increase to 0.5 mg once a week   sertraline (ZOLOFT) 100 MG tablet Take 2 tablets (200 mg total) by mouth daily.   tamsulosin (FLOMAX) 0.4 MG CAPS capsule Take 1 capsule (0.4 mg total) by mouth daily after  supper.   triamcinolone cream (KENALOG) 0.1 % Apply 1 application topically 2 (two) times daily.   vitamin B-12 (CYANOCOBALAMIN) 1000 MCG tablet Take 1 tablet (1,000 mcg total) by mouth daily.   No facility-administered encounter medications on file as of 03/08/2022.   Recent Office Vitals: BP Readings from Last 3 Encounters:  01/14/22 138/76  11/20/21 (!) 159/93  10/20/21 118/60   Pulse Readings from Last 3 Encounters:  01/14/22 71  11/20/21 98  10/20/21 88    Wt Readings from Last 3 Encounters:  01/14/22 182 lb (82.6 kg)  11/19/21 181 lb 7 oz (82.3 kg)  10/20/21 181 lb 6 oz (82.3 kg)     Kidney Function Lab Results  Component Value Date/Time   CREATININE 1.81 (H) 10/20/2021 02:49 PM   CREATININE 1.69 (H) 07/19/2021 12:44 PM   CREATININE 1.5 (H) 07/30/2015 09:57 AM   CREATININE 1.2 07/16/2013 10:59 AM   GFR 32.85 (L) 10/20/2021 02:49 PM   GFRNONAA 46 (L) 05/19/2021 02:44 PM   GFRAA 58 (L) 06/25/2018 09:49 PM       Latest Ref Rng & Units 10/20/2021    2:49 PM 07/19/2021   12:44 PM 05/19/2021    2:51 PM  BMP  Glucose 70 - 99 mg/dL 163  310  135   BUN 6 - 23 mg/dL 37  29  22   Creatinine 0.40 - 1.50 mg/dL 1.81  1.69  1.50   Sodium 135 - 145 mEq/L 141  137  141   Potassium 3.5 - 5.1 mEq/L 5.2 No hemolysis seen  4.7  4.8   Chloride 96 - 112 mEq/L 105  102  104   CO2 19 - 32 mEq/L 26  27    Calcium 8.4 - 10.5 mg/dL 9.0  9.2       Attempted contact with [name] 3 times on 03/10/22,03/14/22,03/21/22. Unsuccessful outreach. Will attempt contact next month.   Current antihypertensive regimen:  No pharmacotherapy    Adherence Review: Is the patient currently on ACE/ARB medication? No Does the patient have >5 day gap between last estimated fill dates? No  Star Rating Drugs:  Medication:  Last Fill: Day Supply Atorvastatin 20mg  01/17/22 90 Glipizide 10mg  02/24/22 90 Ozempic  10/13/21 84   Care Gaps: Annual wellness visit in last year? Yes Most Recent BP  reading:138/76   If Diabetic: Most recent A1C reading:6.4  01/14/22 Last eye exam / retinopathy screening:UTD Last diabetic foot exam:UTD  Summary of recommendations from last CCM Pharmacy visit (Date:12/13/21) Summary: CCM F/U visit -DM: A1c 7.2% (09/2021), improved; avg BG 189 per freestyle libre. -Hx CVA: LDL 83 (03/2021) prior to re-starting atorvastatin, goal LDL < 70 given recent CVA; lipids have not be re-checked yet   Recommendations/Changes made from today's visit: -No med changes -Recommend repeat lipid panel at next [place]  PCP appointment on 04/25/22 and CCM appointment on 06/07/22    [name], CPP notified  [name], [place]  [phone]

## 2022-03-09 DIAGNOSIS — D492 Neoplasm of unspecified behavior of bone, soft tissue, and skin: Secondary | ICD-10-CM | POA: Diagnosis not present

## 2022-03-09 DIAGNOSIS — C4402 Squamous cell carcinoma of skin of lip: Secondary | ICD-10-CM | POA: Diagnosis not present

## 2022-03-09 DIAGNOSIS — C44329 Squamous cell carcinoma of skin of other parts of face: Secondary | ICD-10-CM | POA: Diagnosis not present

## 2022-03-09 DIAGNOSIS — L821 Other seborrheic keratosis: Secondary | ICD-10-CM | POA: Diagnosis not present

## 2022-03-15 DIAGNOSIS — D492 Neoplasm of unspecified behavior of bone, soft tissue, and skin: Secondary | ICD-10-CM | POA: Diagnosis not present

## 2022-04-03 ENCOUNTER — Other Ambulatory Visit: Payer: Self-pay | Admitting: Family Medicine

## 2022-04-11 ENCOUNTER — Ambulatory Visit: Payer: Medicare HMO | Admitting: Family Medicine

## 2022-04-18 ENCOUNTER — Other Ambulatory Visit: Payer: Self-pay | Admitting: Family Medicine

## 2022-04-18 DIAGNOSIS — L570 Actinic keratosis: Secondary | ICD-10-CM | POA: Diagnosis not present

## 2022-04-18 DIAGNOSIS — Z08 Encounter for follow-up examination after completed treatment for malignant neoplasm: Secondary | ICD-10-CM | POA: Diagnosis not present

## 2022-04-18 DIAGNOSIS — D225 Melanocytic nevi of trunk: Secondary | ICD-10-CM | POA: Diagnosis not present

## 2022-04-18 DIAGNOSIS — I872 Venous insufficiency (chronic) (peripheral): Secondary | ICD-10-CM | POA: Diagnosis not present

## 2022-04-18 DIAGNOSIS — L821 Other seborrheic keratosis: Secondary | ICD-10-CM | POA: Diagnosis not present

## 2022-04-18 DIAGNOSIS — Z85828 Personal history of other malignant neoplasm of skin: Secondary | ICD-10-CM | POA: Diagnosis not present

## 2022-04-18 DIAGNOSIS — L814 Other melanin hyperpigmentation: Secondary | ICD-10-CM | POA: Diagnosis not present

## 2022-04-20 DIAGNOSIS — C441222 Squamous cell carcinoma of skin of right lower eyelid, including canthus: Secondary | ICD-10-CM | POA: Diagnosis not present

## 2022-04-20 DIAGNOSIS — C441221 Squamous cell carcinoma of skin of right upper eyelid, including canthus: Secondary | ICD-10-CM | POA: Diagnosis not present

## 2022-04-20 DIAGNOSIS — D485 Neoplasm of uncertain behavior of skin: Secondary | ICD-10-CM | POA: Diagnosis not present

## 2022-04-20 DIAGNOSIS — C44121 Squamous cell carcinoma of skin of unspecified eyelid, including canthus: Secondary | ICD-10-CM | POA: Diagnosis not present

## 2022-04-22 DIAGNOSIS — D649 Anemia, unspecified: Secondary | ICD-10-CM | POA: Diagnosis not present

## 2022-04-22 DIAGNOSIS — G309 Alzheimer's disease, unspecified: Secondary | ICD-10-CM | POA: Diagnosis not present

## 2022-04-22 DIAGNOSIS — E1169 Type 2 diabetes mellitus with other specified complication: Secondary | ICD-10-CM | POA: Diagnosis not present

## 2022-04-22 DIAGNOSIS — E1165 Type 2 diabetes mellitus with hyperglycemia: Secondary | ICD-10-CM | POA: Diagnosis not present

## 2022-04-22 DIAGNOSIS — E785 Hyperlipidemia, unspecified: Secondary | ICD-10-CM | POA: Diagnosis not present

## 2022-04-22 DIAGNOSIS — K219 Gastro-esophageal reflux disease without esophagitis: Secondary | ICD-10-CM | POA: Diagnosis not present

## 2022-04-22 DIAGNOSIS — H3530 Unspecified macular degeneration: Secondary | ICD-10-CM | POA: Diagnosis not present

## 2022-04-22 DIAGNOSIS — F0284 Dementia in other diseases classified elsewhere, unspecified severity, with anxiety: Secondary | ICD-10-CM | POA: Diagnosis not present

## 2022-04-22 DIAGNOSIS — F419 Anxiety disorder, unspecified: Secondary | ICD-10-CM | POA: Diagnosis not present

## 2022-04-22 DIAGNOSIS — N400 Enlarged prostate without lower urinary tract symptoms: Secondary | ICD-10-CM | POA: Diagnosis not present

## 2022-04-22 DIAGNOSIS — H9193 Unspecified hearing loss, bilateral: Secondary | ICD-10-CM | POA: Diagnosis not present

## 2022-04-22 DIAGNOSIS — C801 Malignant (primary) neoplasm, unspecified: Secondary | ICD-10-CM | POA: Diagnosis not present

## 2022-04-24 NOTE — Progress Notes (Signed)
Ethan Porr T. [name], MD, CAQ Sports Medicine [facility] [address] [place], [address]  Phone: [phone]  FAX: [phone]  [name] - 89 y.o. male  MRN [id number]  Date of Birth: 1931-02-25  Date: 04/25/2022  PCP: Hannah Beat, MD  Referral: Hannah Beat, MD  Chief Complaint  Patient presents with   Hypertension   Diabetes   Dementia   Subjective:   [name] is a 89 y.o. very pleasant male patient with Body mass index is 27.23 kg/m. who presents with the following:  Patient presents in follow-up today for chronic medical problems.  Last year, he did have a stroke that left him with fairly debilitated.  He has been making good progress since then and has recovered some compared to his initial state of health after his stroke.  He also has chronic kidney disease and anemia of chronic disease, and his kidney function had worsened some the last time he was here in the office.  Most recently, he has been to see a new dermatologist and has had biopsies about the eye and eyelid.  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  They have been discussing with him whether or not to operate on this with Mohs surgery, and he and his wife are still in discussion about whether or not he should have this.  He does still have partial sight in both eyes.  He also has diabetes, and most recently this was well-controlled.  Prior to then, his blood sugars had been relatively not well-controlled with an A1c of 8.3 just over 1 year ago.  Right now, he is on glipizide 10 mg once a day, and he is also on Ozempic 0.5 mg. - Sees Dr. Lonzo Cloud  Using a walker now all the time.   Diabetes Mellitus: Tolerating Medications: yes Compliance with diet: fair, Body mass index is 27.23 kg/m. Exercise: minimal / intermittent Avg blood sugars at home: not checking Foot  problems: none Hypoglycemia: none No nausea, vomitting, blurred vision, polyuria.  Lab Results  Component Value Date   HGBA1C 6.4 (A) 01/14/2022   HGBA1C 7.2 (A) 09/29/2021   HGBA1C 8.3 (H) 03/08/2021   Lab Results  Component Value Date   MICROALBUR 7.8 (H) 12/20/2019   LDLCALC 83 03/08/2021   CREATININE 1.81 (H) 10/20/2021    Wt Readings from Last 3 Encounters:  04/25/22 184 lb 6 oz (83.6 kg)  01/14/22 182 lb (82.6 kg)  11/19/21 181 lb 7 oz (82.3 kg)    HTN: Tolerating all medications without side effects Stable and at goal No CP, no sob. No HA.  BP Readings from Last 3 Encounters:  04/25/22 114/70  01/14/22 138/76  11/20/21 (!) 159/93    Basic Metabolic Panel:    Component Value Date/Time   NA 141 10/20/2021 1449   NA 143 07/30/2015 0957   K 5.2 No hemolysis seen (H) 10/20/2021 1449   K 5.3 No visable hemolysis (H) 07/30/2015 0957   CL 105 10/20/2021 1449   CL 103 07/20/2012 1046   CO2 26 10/20/2021 1449   CO2 28 07/30/2015 0957   BUN 37 (H) 10/20/2021 1449   BUN 14.4 07/30/2015 0957   CREATININE 1.81 (H) 10/20/2021 1449   CREATININE 1.5 (H) 07/30/2015 0957   GLUCOSE 163 (H) 10/20/2021 1449   GLUCOSE 231 (H) 07/30/2015 0957   GLUCOSE  215 (H) 07/20/2012 1046   CALCIUM 9.0 10/20/2021 1449   CALCIUM 9.5 07/30/2015 0957    He also has chronic kidney disease stage III, and we are following up on this as well today.  Review of Systems is noted in the HPI, as appropriate  Objective:   BP 114/70   Pulse 89   Temp 98.4 F (36.9 C) (Oral)   Ht 5\' 9"  (1.753 m)   Wt 184 lb 6 oz (83.6 kg)   SpO2 99%   BMI 27.23 kg/m   GEN: No acute distress; alert,appropriate. PULM: Breathing comfortably in no respiratory distress PSYCH: Normally interactive.  CV: RRR, no m/g/r  PULM: Normal respiratory rate, no accessory muscle use. No wheezes, crackles or rhonchi  The strength in his lower extremities is much improved compared to when he had had his stroke last year.   He is no longer wearing his AFO.  Laboratory and Imaging Data:  Assessment and Plan:     ICD-10-CM   1. Right pontine cerebrovascular accident (HCC)  I63.50     2. Encounter for long-term (current) use of medications  Z79.899 Basic metabolic panel    CBC with Differential/Platelet    Hepatic function panel    3. Coronary artery disease involving native coronary artery of native heart without angina pectoris  I25.10     4. ALZHEIMER'S DISEASE, MILD  G30.9    F02.80     5. Essential hypertension  I10     6. HYPERCHOLESTEROLEMIA  E78.00     7. Generalized anxiety disorder  F41.1     8. Nonmelanoma skin cancer  C44.90      He has made a fair partial recovery from his stroke from last year.  His leg strength is improved, and he is no longer wearing his AFO brace.  His balance is still off, and he is using a walker at all times to get around.  I think a large challenge right now is his squamous cell, biopsy confirmed skin cancer in and about the eyes and eyelids on the right and left.  He and his wife are struggling whether or not they should have an extensive surgery at almost 89 years old.  He still has partial sight in both eyes, and he has been told that he could lose 1 eye with the Mohs surgery and require extensive grafting.  He and Mrs. Nazari seem to be opting to decline surgery.  Mild dementia, stable.  Coronary disease, he is very compliant with taking all of his medication.  Blood pressure is stable today.  Chronic kidney disease stage III as well as anemia, we will check follow-up labs on this today.  Medication Management during today's office visit: Meds ordered this encounter  Medications   Specialty Vitamins Products (ICAPS LUTEIN & ZEAXANTHIN) TBEC    Sig: Take 1 tablet by mouth 2 (two) times daily.    Dispense:  120 tablet    Refill:  6   Medications Discontinued During This Encounter  Medication Reason   aspirin EC 81 MG tablet    Multiple  Vitamins-Minerals (ICAPS) CAPS Duplicate   Multiple Vitamins-Minerals (ICAPS LUTEIN & ZEAXANTHIN PO) Duplicate    Orders placed today for conditions managed today: Orders Placed This Encounter  Procedures   Basic metabolic panel   CBC with Differential/Platelet   Hepatic function panel    Disposition: Return in about 6 months (around 10/24/2022) for Wellness Exam.  [employer] One speech-to-text software was used for transcription  in this dictation.  Possible transcriptional errors can occur using Animal nutritionist.   Signed,  Elpidio Galea. Shritha Bresee, MD   Outpatient Encounter Medications as of 04/25/2022  Medication Sig   atorvastatin (LIPITOR) 20 MG tablet TAKE 1 TABLET BY MOUTH EVERY DAY   Continuous Blood Gluc Receiver (FREESTYLE LIBRE 2 READER) DEVI Use to check blood sugar continous   Continuous Blood Gluc Sensor (FREESTYLE LIBRE 2 SENSOR) MISC Use to check blood sugar continuous   cyanocobalamin ([facility]S VITAMIN B12) 1000 MCG tablet TAKE 1 TABLET BY MOUTH EVERY DAY   donepezil (ARICEPT) 10 MG tablet TAKE 1 TABLET BY MOUTH EVERYDAY AT BEDTIME   ferrous sulfate 325 (65 FE) MG tablet TAKE 1 TABLET BY MOUTH EVERY DAY WITH BREAKFAST   glipiZIDE (GLUCOTROL) 10 MG tablet Take 1 tablet (10 mg total) by mouth daily before breakfast.   pantoprazole (PROTONIX) 40 MG tablet TAKE 1 TABLET BY MOUTH EVERY DAY   Propylene Glycol (SYSTANE BALANCE) 0.6 % SOLN Apply 1 drop to eye 2 (two) times daily as needed (dry eyes).   Semaglutide,0.25 or 0.5MG /DOS, (OZEMPIC, 0.25 OR 0.5 MG/DOSE,) 2 MG/3ML SOPN 0.5 mg by Other route once a week. Inject 0.25 mg once a week for 4 weeks, then increase to 0.5 mg once a week   sertraline (ZOLOFT) 100 MG tablet Take 2 tablets (200 mg total) by mouth daily.   Specialty Vitamins Products (ICAPS LUTEIN & ZEAXANTHIN) TBEC Take 1 tablet by mouth 2 (two) times daily.   tamsulosin (FLOMAX) 0.4 MG CAPS capsule TAKE 1 CAPSULE BY MOUTH EVERY DAY AFTER SUPPER   triamcinolone cream  (KENALOG) 0.1 % Apply 1 application topically 2 (two) times daily.   [DISCONTINUED] Multiple Vitamins-Minerals (ICAPS LUTEIN & ZEAXANTHIN PO) Take 1 capsule by mouth in the morning and at bedtime. I - Caps   [DISCONTINUED] aspirin EC 81 MG tablet Take 81 mg by mouth daily as needed for moderate pain.   [DISCONTINUED] Multiple Vitamins-Minerals (ICAPS) CAPS Take by mouth.   No facility-administered encounter medications on file as of 04/25/2022.

## 2022-04-25 ENCOUNTER — Ambulatory Visit (INDEPENDENT_AMBULATORY_CARE_PROVIDER_SITE_OTHER): Payer: PPO | Admitting: Family Medicine

## 2022-04-25 ENCOUNTER — Encounter: Payer: Self-pay | Admitting: Family Medicine

## 2022-04-25 VITALS — BP 114/70 | HR 89 | Temp 98.4°F | Ht 69.0 in | Wt 184.4 lb

## 2022-04-25 DIAGNOSIS — F411 Generalized anxiety disorder: Secondary | ICD-10-CM | POA: Diagnosis not present

## 2022-04-25 DIAGNOSIS — G309 Alzheimer's disease, unspecified: Secondary | ICD-10-CM

## 2022-04-25 DIAGNOSIS — I2510 Atherosclerotic heart disease of native coronary artery without angina pectoris: Secondary | ICD-10-CM

## 2022-04-25 DIAGNOSIS — F0280 Dementia in other diseases classified elsewhere without behavioral disturbance: Secondary | ICD-10-CM | POA: Diagnosis not present

## 2022-04-25 DIAGNOSIS — E7800 Pure hypercholesterolemia, unspecified: Secondary | ICD-10-CM | POA: Diagnosis not present

## 2022-04-25 DIAGNOSIS — I10 Essential (primary) hypertension: Secondary | ICD-10-CM

## 2022-04-25 DIAGNOSIS — N1831 Chronic kidney disease, stage 3a: Secondary | ICD-10-CM

## 2022-04-25 DIAGNOSIS — C4490 Unspecified malignant neoplasm of skin, unspecified: Secondary | ICD-10-CM

## 2022-04-25 DIAGNOSIS — Z79899 Other long term (current) drug therapy: Secondary | ICD-10-CM | POA: Diagnosis not present

## 2022-04-25 DIAGNOSIS — I6350 Cerebral infarction due to unspecified occlusion or stenosis of unspecified cerebral artery: Secondary | ICD-10-CM | POA: Diagnosis not present

## 2022-04-25 MED ORDER — ICAPS LUTEIN & ZEAXANTHIN PO TBEC: 1.0000 | DELAYED_RELEASE_TABLET | Freq: Two times a day (BID) | ORAL | 6 refills | Status: DC

## 2022-04-26 ENCOUNTER — Encounter: Payer: Self-pay | Admitting: Family Medicine

## 2022-04-26 DIAGNOSIS — N1831 Chronic kidney disease, stage 3a: Secondary | ICD-10-CM | POA: Insufficient documentation

## 2022-04-26 LAB — CBC WITH DIFFERENTIAL/PLATELET
Basophils Absolute: 0 10*3/uL (ref 0.0–0.1)
Basophils Relative: 0.1 % (ref 0.0–3.0)
Eosinophils Absolute: 0.1 10*3/uL (ref 0.0–0.7)
Eosinophils Relative: 0.6 % (ref 0.0–5.0)
HCT: 42.6 % (ref 39.0–52.0)
Hemoglobin: 14.3 g/dL (ref 13.0–17.0)
Lymphocytes Relative: 5.7 % — ABNORMAL LOW (ref 12.0–46.0)
Lymphs Abs: 0.5 10*3/uL — ABNORMAL LOW (ref 0.7–4.0)
MCHC: 33.5 g/dL (ref 30.0–36.0)
MCV: 99.4 fl (ref 78.0–100.0)
Monocytes Absolute: 1.8 10*3/uL — ABNORMAL HIGH (ref 0.1–1.0)
Monocytes Relative: 19.9 % — ABNORMAL HIGH (ref 3.0–12.0)
Neutro Abs: 6.8 10*3/uL (ref 1.4–7.7)
Neutrophils Relative %: 73.7 % (ref 43.0–77.0)
Platelets: 118 10*3/uL — ABNORMAL LOW (ref 150.0–400.0)
RBC: 4.28 Mil/uL (ref 4.22–5.81)
RDW: 13.3 % (ref 11.5–15.5)
WBC: 9.2 10*3/uL (ref 4.0–10.5)

## 2022-04-26 LAB — BASIC METABOLIC PANEL
BUN: 25 mg/dL — ABNORMAL HIGH (ref 6–23)
CO2: 27 mEq/L (ref 19–32)
Calcium: 9.4 mg/dL (ref 8.4–10.5)
Chloride: 102 mEq/L (ref 96–112)
Creatinine, Ser: 1.65 mg/dL — ABNORMAL HIGH (ref 0.40–1.50)
GFR: 36.58 mL/min — ABNORMAL LOW (ref >60.00)
Glucose, Bld: 215 mg/dL — ABNORMAL HIGH (ref 70–99)
Potassium: 4.6 mEq/L (ref 3.5–5.1)
Sodium: 138 mEq/L (ref 135–145)

## 2022-04-26 LAB — HEPATIC FUNCTION PANEL
ALT: 12 U/L (ref 0–53)
AST: 11 U/L (ref 0–37)
Albumin: 4.3 g/dL (ref 3.5–5.2)
Alkaline Phosphatase: 96 U/L (ref 39–117)
Bilirubin, Direct: 0 mg/dL (ref 0.0–0.3)
Total Bilirubin: 0.4 mg/dL (ref 0.2–1.2)
Total Protein: 7.2 g/dL (ref 6.0–8.3)

## 2022-04-27 ENCOUNTER — Other Ambulatory Visit: Payer: Self-pay | Admitting: Family Medicine

## 2022-05-01 ENCOUNTER — Other Ambulatory Visit: Payer: Self-pay | Admitting: Family Medicine

## 2022-05-18 ENCOUNTER — Other Ambulatory Visit: Payer: Self-pay | Admitting: Family Medicine

## 2022-05-18 DIAGNOSIS — E1159 Type 2 diabetes mellitus with other circulatory complications: Secondary | ICD-10-CM

## 2022-05-20 ENCOUNTER — Other Ambulatory Visit: Payer: Self-pay | Admitting: Family Medicine

## 2022-05-25 ENCOUNTER — Ambulatory Visit: Payer: Medicare HMO | Admitting: Internal Medicine

## 2022-06-02 ENCOUNTER — Other Ambulatory Visit: Payer: Self-pay | Admitting: Internal Medicine

## 2022-06-02 DIAGNOSIS — E1159 Type 2 diabetes mellitus with other circulatory complications: Secondary | ICD-10-CM

## 2022-06-07 ENCOUNTER — Ambulatory Visit: Payer: Medicare HMO | Admitting: Pharmacist

## 2022-06-07 NOTE — Progress Notes (Signed)
Care Management & Coordination Services Pharmacy Note  06/07/2022 Name:  [name] MRN:  [id number] DOB:  November 18, 1930  Summary: -DM: A1c 6.4% (01/2022), improved; avg BG 140s per patient (wearing Elenor Legato) -Hx CVA: LDL 83 (03/2021) prior to re-starting atorvastatin, goal LDL < 70 given recent CVA; lipids have not be re-checked yet; pt also reports he stopped taking aspirin on his own after nosebleed issues in the fall; Reviewed aspirin indication - with stroke history and CAD, aspirin is recommended, however given hx of severe nosebleeds requiring ED eval/packing, reasonable to continue off aspirin   Recommendations/Changes made from today's visit: -No med changes  Follow up plan: -Health Concierge will call patient 3 months for DM update -Pharmacist follow up PRN -Endocrine appt 07/18/22   Subjective: [name] is an 89 y.o. year old male who is a primary patient of Copland, Frederico Hamman, MD.  The care coordination team was consulted for assistance with disease management and care coordination needs.    Engaged with patient by telephone for follow up visit.  Recent office visits: 04/25/22 Dr Lorelei Pont OV: f/u - d/c aspirin (pt preference)  10/20/21 Dr Lorelei Pont OV: f/u - BMP, CBC stable, GFR slightly down. 07/19/21 Dr Lorelei Pont OV: f/u - depression/anxiety worse since stroke. C/o dysuria, constipation. Ordered MRI brain. Increased sertraline to 200 mg/day.   06/02/21 Dr Lorelei Pont OV: ER follow up (stroke sx) - improving. A1c 8.3%, increase glipizide to 10 mg BID. Increase sertraline to 150 mg. Rx triamcinolone cream for rash.  Recent consult visits: 01/14/22 Dr Kelton Pillar (Endocrine): A1c 6.4%; no changes  11/24/21 Dr Fredric Dine (ENT): Epistaxis. Merisel packing removed. Nosebleed precautions reviewed.   09/29/21 Dr Kelton Pillar (Endocrine): new pt - A1c 7.2%; decrease glipizide to 10 mg daily, continue Ozempic. Goal to prevent [facility] visits: 11/19/21 ED visit ([facility]): R epistaxis.  Discharged with ppx Keflex.    Objective:  Lab Results  Component Value Date   CREATININE 1.65 (H) 04/25/2022   BUN 25 (H) 04/25/2022   GFR 36.58 (L) 04/25/2022   EGFR 44 (L) 07/30/2015   GFRNONAA 46 (L) 05/19/2021   GFRAA 58 (L) 06/25/2018   NA 138 04/25/2022   K 4.6 04/25/2022   CALCIUM 9.4 04/25/2022   CO2 27 04/25/2022   GLUCOSE 215 (H) 04/25/2022    Lab Results  Component Value Date/Time   HGBA1C 6.4 (A) 01/14/2022 01:37 PM   HGBA1C 7.2 (A) 09/29/2021 02:54 PM   HGBA1C 8.3 (H) 03/08/2021 05:53 AM   HGBA1C 7.3 (H) 05/11/2020 10:31 AM   GFR 36.58 (L) 04/25/2022 02:40 PM   GFR 32.85 (L) 10/20/2021 02:49 PM   MICROALBUR 7.8 (H) 12/20/2019 09:20 AM   MICROALBUR 3.4 (H) 10/31/2018 10:39 AM    Last diabetic Eye exam:  Lab Results  Component Value Date/Time   HMDIABEYEEXA Retinopathy (A) 04/24/2019 12:00 AM    Last diabetic Foot exam:  Lab Results  Component Value Date/Time   HMDIABFOOTEX done 08/09/2012 12:00 AM     Lab Results  Component Value Date   CHOL 122 03/08/2021   HDL 17 (L) 03/08/2021   LDLCALC 83 03/08/2021   LDLDIRECT 130.0 12/20/2019   TRIG 109 03/08/2021   CHOLHDL 7.2 03/08/2021       Latest Ref Rng & Units 04/25/2022    2:40 PM 10/20/2021    2:49 PM 07/19/2021   12:44 PM  Hepatic Function  Total Protein 6.0 - 8.3 g/dL 7.2  6.7  6.6   Albumin 3.5 - 5.2 g/dL 4.3  4.4  4.1   AST 0 - 37 U/L '11  10  9   '$ ALT 0 - 53 U/L '12  13  11   '$ Alk Phosphatase 39 - 117 U/L 96  87  86   Total Bilirubin 0.2 - 1.2 mg/dL 0.4  0.3  0.4   Bilirubin, Direct 0.0 - 0.3 mg/dL 0.0  0.1  0.1     Lab Results  Component Value Date/Time   TSH 2.16 10/20/2021 02:49 PM   TSH 1.789 03/10/2021 12:57 AM   TSH 1.110 04/19/2011 01:29 PM       Latest Ref Rng & Units 04/25/2022    2:40 PM 10/20/2021    2:49 PM 07/19/2021   12:44 PM  CBC  WBC 4.0 - 10.5 K/uL 9.2  9.0  6.5   Hemoglobin 13.0 - 17.0 g/dL 14.3  11.0  12.8   Hematocrit 39.0 - 52.0 % 42.6  34.0  38.0    Platelets 150.0 - 400.0 K/uL 118.0  133.0  123.0     Lab Results  Component Value Date/Time   VITAMINB12 179 (L) 03/10/2021 12:57 AM    Clinical ASCVD: Yes  The ASCVD Risk score (Arnett DK, et al., 2019) failed to calculate for the following reasons:   The 2019 ASCVD risk score is only valid for ages 80 to 45   The patient has a prior MI or stroke diagnosis        04/25/2022    2:19 PM 08/17/2021    2:06 PM 08/10/2021    2:14 PM  Depression screen PHQ 2/9  Decreased Interest 0 0 0  Down, Depressed, Hopeless 0 1 0  PHQ - 2 Score 0 1 0     Social History   Tobacco Use  Smoking Status Former   Packs/day: 2.00   Years: 42.00   Total pack years: 84.00   Types: Cigarettes   Start date: 02/25/1953   Quit date: 04/04/1984   Years since quitting: 38.2  Smokeless Tobacco Former   Types: Chew   Quit date: 07/27/1986  Tobacco Comments   quit tobacco 26 years ago   BP Readings from Last 3 Encounters:  04/25/22 114/70  01/14/22 138/76  11/20/21 (!) 159/93   Pulse Readings from Last 3 Encounters:  04/25/22 89  01/14/22 71  11/20/21 98   Wt Readings from Last 3 Encounters:  04/25/22 184 lb 6 oz (83.6 kg)  01/14/22 182 lb (82.6 kg)  11/19/21 181 lb 7 oz (82.3 kg)   BMI Readings from Last 3 Encounters:  04/25/22 27.23 kg/m  01/14/22 26.88 kg/m  11/19/21 26.79 kg/m    Allergies  Allergen Reactions   Sulfa Antibiotics Nausea Only    Medications Reviewed Today     Reviewed by [name], MD (Physician) on 04/26/22 at [phone]  Med List Status: <None>   Medication Order Taking? Sig Documenting Provider Last Dose Status Informant  atorvastatin (LIPITOR) 20 MG tablet [id number] Yes TAKE 1 TABLET BY MOUTH EVERY DAY Copland, Spencer, MD Taking Active   Continuous Blood Gluc Receiver (FREESTYLE LIBRE 2 READER) DEVI [id number] Yes Use to check blood sugar continous Copland, Frederico Hamman, MD Taking Active   Continuous Blood Gluc Sensor (FREESTYLE LIBRE 2 SENSOR) [place] [id number]  Yes Use to check blood sugar continuous Copland, Spencer, MD Taking Active   cyanocobalamin (CVS VITAMIN B12) 1000 MCG tablet [id number] Yes TAKE 1 TABLET BY MOUTH EVERY DAY Copland, Spencer, MD Taking Active   donepezil (ARICEPT) 10 MG tablet [id number] Yes  TAKE 1 TABLET BY MOUTH EVERYDAY AT BEDTIME Copland, Spencer, MD Taking Active   ferrous sulfate 325 (65 FE) MG tablet [id number] Yes TAKE 1 TABLET BY MOUTH EVERY DAY WITH BREAKFAST Copland, Spencer, MD Taking Active   glipiZIDE (GLUCOTROL) 10 MG tablet [id number] Yes Take 1 tablet (10 mg total) by mouth daily before breakfast. Shamleffer, Melanie Crazier, MD Taking Active   pantoprazole (PROTONIX) 40 MG tablet [id number] Yes TAKE 1 TABLET BY MOUTH EVERY DAY Copland, Spencer, MD Taking Active   Propylene Glycol (SYSTANE BALANCE) 0.6 % SOLN [id number] Yes Apply 1 drop to eye 2 (two) times daily as needed (dry eyes). [provider] Taking Active Spouse/Significant Other  Semaglutide,0.25 or 0.'5MG'$ /DOS, (OZEMPIC, 0.25 OR 0.5 MG/DOSE,) 2 MG/3ML SOPN [id number] Yes 0.5 mg by Other route once a week. Inject 0.25 mg once a week for 4 weeks, then increase to 0.5 mg once a week Shamleffer, Melanie Crazier, MD Taking Active   sertraline (ZOLOFT) 100 MG tablet [id number] Yes Take 2 tablets (200 mg total) by mouth daily. [name], MD Taking Active   Specialty Vitamins Products (ICAPS LUTEIN & ZEAXANTHIN) TBEC [id number] Yes Take 1 tablet by mouth 2 (two) times daily. Copland, Frederico Hamman, MD  Active   tamsulosin (FLOMAX) 0.4 MG CAPS capsule [id number] Yes TAKE 1 CAPSULE BY MOUTH EVERY DAY AFTER SUPPER Copland, Spencer, MD Taking Active   triamcinolone cream (KENALOG) 0.1 % [id number] Yes Apply 1 application topically 2 (two) times daily. [name], MD Taking Active             SDOH:  (Social Determinants of Health) assessments and interventions performed: No SDOH Interventions    Flowsheet Row Clinical Support from 08/10/2021 in [place] [facility] [place] Management from 09/03/2020 in [place] at [place] from 09/03/2018 in [place] at [place] Interventions Intervention Not Indicated -- --  Housing Interventions Intervention Not Indicated -- --  Transportation Interventions Intervention Not Indicated -- --  Depression Interventions/Treatment  -- -- [id number] Score <4 Follow-up Not Indicated  Financial Strain Interventions Intervention Not Indicated Intervention Not Indicated --  Physical Activity Interventions Intervention Not Indicated -- --  Stress Interventions Intervention Not Indicated -- --  Social Connections Interventions Intervention Not Indicated -- --       Medication Assistance: None required.  Patient affirms current coverage meets needs.  Medication Access: Within the past 30 days, how often has patient missed a dose of medication? 0 Is a pillbox or other method used to improve adherence? Yes  Factors that may affect medication adherence? no barriers identified Are meds synced by current pharmacy? No  Are meds delivered by current pharmacy? No  Does patient experience delays in picking up medications due to transportation concerns? No   [facility] Services Reviewed: Is patient disadvantaged to use [facility]?: Yes  Current Rx insurance plan: HTA Name and location of [id number] [id number] [place] [id number]: [phone]: [phone] [facility] services reviewed with patient today?: No  Patient requests to transfer care to [facility]?: No  Reason patient declined to change pharmacies: Disadvantaged due to insurance/mail order  Compliance/Adherence/Medication fill history: Care Gaps: Eye exam (due 04/2020)  Star-Rating  Drugs: Atorvastatin - PDC 100% Glipizide - PDC 93% Ozempic - PDC 17% (LF  10/2021)   ASSESSMENT / PLAN  Hyperlipidemia / CAD (LDL goal < 16) -Query controlled - LDL 83 (03/2021) prior to statin; lipids have not been re-checked since re-starting atorvastatin after CVA -Hx CAD; cath 2014; hx CVA 03/2021; hx nosebleeds on aspirin, pt reports he stopped taking aspirin several months ago due to nosebleeds, he did not consult with providers about this -Current treatment: Atorvastatin 20 mg daily - Appropriate, Query Effective Aspirin 81 mg daily - not taking OTC fish oil 100 mg BID - Appropriate, Effective, Safe, Accessible -Medications previously tried: atorvastatin 40 mg (pt self-dc'd 2021)  -Educated on Cholesterol goals; Benefits of statin for ASCVD risk reduction; -Reviewed aspirin indication - with stroke history and CAD, he really should be on aspirin indefinitely; given hx of severe nosebleeds requiring ED eval/packing, reasonable to continue off aspirin;  -Recommend to continue current medication  Hypertension (BP goal <140/90) -Query controlled - BP elevated at home, at goal in office visit -Current home readings: 126/74, 77 -Denies hypotensive/hypertensive symptoms -Drinks coffee - 1 cup/day -Current treatment: None  -Medications previously tried: none reported  -Educated on Importance of home blood pressure monitoring;Proper BP monitoring technique; -Counseled to monitor BP at home daily  Diabetes (A1c goal <8%) -Controlled - A1c 6.4% (01/2022)  -Neurology has recommended A1c goal < 6.5% for secondary prevention of stroke. -Established with endocrine (Dr Kelton Pillar) 09/2021 -Current home glucose readings Elenor Legato): 140s -Current medications: Glipizide 10 mg daily - Appropriate, Effective, Safe, Accessible Ozempic 0.5 mg weekly - Appropriate, Effective, Safe, Accessible Freestyle Libre 2 w/ reader - Appropriate, Effective, Safe, Accessible -Medications previously tried:  metformin (GI); Januvia -Educated on A1c and blood sugar goals;  -Recommend to continue current medication  Chronic Kidney Disease Stage 3b  -All medications assessed for renal dosing and appropriateness in chronic kidney disease. -Recommended to continue current medication  Depression/Anxiety (Goal: Improve mood) -Controlled - per patient report; dose increased 07/2021 -Current treatment: Sertraline 100 mg - 2 tab daily  - Appropriate, Effective, Safe, Accessible -Medications previously tried/failed: none reported -Recommended to continue current medication  Memory impairment (Goal: slow progression) -Controlled -Current treatment  Donepezil 10 mg daily HS - Appropriate, Effective, Safe, Accessible -Medications previously tried: n/a  -Recommended to continue current medication  Health Maintenance -Vaccine gaps: Shingrix -OTC: ferrous sulfate, vitamin B12, Icaps -Hx nosebleeds - 11/2021 resolved after packing, pt discontinued aspirin on his own around the same time -Pt reports 1 fall 2 weeks ago, fell while getting up from bed, reports bruises did not hit his head   Charlene Brooke, PharmD, BCACP Clinical Pharmacist [facility] at [facility] [phone]

## 2022-06-09 NOTE — Patient Instructions (Signed)
Visit Information  Phone number for Pharmacist: [phone]  Thank you for meeting with me to discuss your medications! Below is a summary of what we talked about during the visit:   Summary: -DM: A1c 6.4% (01/2022), improved; avg BG 140s per patient (wearing Libre) -Hx CVA: LDL 83 (03/2021) prior to re-starting atorvastatin, goal LDL < 70 given recent CVA; lipids have not be re-checked yet; pt also reports he stopped taking aspirin on his own after nosebleed issues in the fall; Reviewed aspirin indication - with stroke history and CAD, aspirin is recommended, however given hx of severe nosebleeds requiring ED eval/packing, reasonable to continue off aspirin   Recommendations/Changes made from today's visit: -No med changes  Follow up plan: -Health Concierge will call patient 3 months for DM update -Pharmacist follow up PRN -Endocrine appt 07/18/22   Charlene Brooke, PharmD, BCACP Clinical Pharmacist [facility] at [facility] [phone]

## 2022-06-13 ENCOUNTER — Telehealth: Payer: Self-pay

## 2022-06-13 NOTE — Patient Outreach (Signed)
  Care Coordination   06/13/2022 Name: MANRAJ YEO MRN: [id number] DOB: January 17, 1931   Care Coordination Outreach Attempts:  Contact made with patients spouse, Cassell Voorhies.  Wife states patient is asleep and request a return call.   Follow Up Plan:  Additional outreach attempts will be made to offer the patient care coordination information and services.   Encounter Outcome:  Pt. Request to Call Back   Care Coordination Interventions:  No, not indicated    Quinn Plowman [facility] [phone] direct line

## 2022-06-14 DIAGNOSIS — H353211 Exudative age-related macular degeneration, right eye, with active choroidal neovascularization: Secondary | ICD-10-CM | POA: Diagnosis not present

## 2022-06-14 DIAGNOSIS — H353123 Nonexudative age-related macular degeneration, left eye, advanced atrophic without subfoveal involvement: Secondary | ICD-10-CM | POA: Diagnosis not present

## 2022-06-14 DIAGNOSIS — H43813 Vitreous degeneration, bilateral: Secondary | ICD-10-CM | POA: Diagnosis not present

## 2022-07-12 ENCOUNTER — Other Ambulatory Visit (HOSPITAL_COMMUNITY): Payer: Self-pay

## 2022-07-12 ENCOUNTER — Telehealth: Payer: Self-pay

## 2022-07-12 NOTE — Telephone Encounter (Signed)
Pharmacy Patient Advocate Encounter  Prior Authorization for Ozempic has been approved by [employer] (ins).    PA # [id number] Effective dates: 07/04/22 through 07/04/23

## 2022-07-18 ENCOUNTER — Encounter: Payer: Self-pay | Admitting: Internal Medicine

## 2022-07-18 ENCOUNTER — Ambulatory Visit: Payer: Medicare HMO | Admitting: Internal Medicine

## 2022-07-18 ENCOUNTER — Ambulatory Visit (INDEPENDENT_AMBULATORY_CARE_PROVIDER_SITE_OTHER): Payer: PPO | Admitting: Internal Medicine

## 2022-07-18 VITALS — BP 118/70 | HR 78 | Ht 69.0 in | Wt 188.0 lb

## 2022-07-18 DIAGNOSIS — E1159 Type 2 diabetes mellitus with other circulatory complications: Secondary | ICD-10-CM | POA: Diagnosis not present

## 2022-07-18 LAB — POCT GLYCOSYLATED HEMOGLOBIN (HGB A1C): Hemoglobin A1C: 6.6 % — AB (ref 4.0–5.6)

## 2022-07-18 MED ORDER — OZEMPIC (0.25 OR 0.5 MG/DOSE) 2 MG/3ML ~~LOC~~ SOPN: 0.5000 mg | PEN_INJECTOR | SUBCUTANEOUS | 3 refills | Status: DC

## 2022-07-18 MED ORDER — GLIPIZIDE 10 MG PO TABS: 10.0000 mg | ORAL_TABLET | Freq: Every day | ORAL | 3 refills | Status: DC

## 2022-07-18 NOTE — Patient Instructions (Signed)
-   Continue Glipizide 10 mg  ONE tablet before Breakfast  - Continue Ozempic 0.5 mg weekly    HOW TO TREAT LOW BLOOD SUGARS (Blood sugar LESS THAN 70 MG/DL) Please follow the RULE OF 15 for the treatment of hypoglycemia treatment (when your (blood sugars are less than 70 mg/dL)   STEP 1: Take 15 grams of carbohydrates when your blood sugar is low, which includes:  3-4 GLUCOSE TABS  OR 3-4 OZ OF JUICE OR REGULAR SODA OR ONE TUBE OF GLUCOSE GEL    STEP 2: RECHECK blood sugar in 15 MINUTES STEP 3: If your blood sugar is still low at the 15 minute recheck --> then, go back to STEP 1 and treat AGAIN with another 15 grams of carbohydrates.

## 2022-07-18 NOTE — Progress Notes (Signed)
Name: [name]  MRN/ DOB: [id number], 1930/10/07   Age/ Sex: 89 y.o., male    PCP: Hannah Beat, MD   Reason for Endocrinology Evaluation: Type 2 Diabetes Mellitus     Date of Initial Endocrinology Visit: 09/29/2021    PATIENT IDENTIFIER: Mr. JOBE CRISTE is a 89 y.o. male with a past medical history of DM, CKD III, CVA and dementia, Hx of prostate cancer. The patient presented for initial endocrinology clinic visit on 09/29/2021 for consultative assistance with his diabetes management.    HPI: Mr. Lawhun   Diagnosed with DM 2009 Prior Medications tried/Intolerance: Metformin- severe diarrhea. Ozempic started 09/2021 Hemoglobin A1c has ranged from 6.8% in 2019, peaking at 8.3% in 2022.    On his initial visit to our clinic his A1c was 7.2% he was on glipizide and was just recently started on Ozempic.  We continued the Ozempic but decrease the glipizide preemptively for hypoglycemia  SUBJECTIVE:   During the last visit (01/14/2022): A1c 6.4%  Today (07/18/22): Mr. Coby is here for a follow up on diabetes management.  He is accompanied by his son today.  He checks his blood sugars multiple times daily. The patient has not had hypoglycemic episodes since the last clinic visit.   He denies any nausea,  or diarrhea but his wife is having GI issues and had an amputation in 2023    HOME DIABETES REGIMEN: Glipizide 10 mg  Ozempic 0.5  mg weekly  (Thursdays)     CONTINUOUS GLUCOSE MONITORING RECORD INTERPRETATION    Dates of Recording: 4/2-4/15/2024  Sensor description:freestyle  Results statistics:   CGM use % of time 94  Average and SD 171/21.9  Time in range  61  %  % Time Above 180 37  % Time above 250 2  % Time Below target 0       Glycemic patterns summary: BG's optimal overnight with fluctuating BG's overnight   Hyperglycemic episodes  postprandial   Hypoglycemic episodes occurred rare during the day   Overnight periods: Optimal        DIABETIC COMPLICATIONS: Microvascular complications:  CKD III, macular degeneration  Denies: retinopathy, neuropathy  Last eye exam: Completed 2023  Macrovascular complications:  CVA, CAD Denies: CAD, PVD   PAST HISTORY: Past Medical History:  Past Medical History:  Diagnosis Date   Adenocarcinoma of left lung, stage 1 2001   T1N0 stage I  adenoca left lung resected 01/03/00    Alzheimer's disease    CAD (coronary artery disease) 2014   a. 09/2012 Cath: LM nl, LAD 50-60p, D1 60-70 m, D1 50ost, LCX nl, RCA min irregs, EF 55-65%.   Generalized anxiety disorder 01/08/2007   Qualifier: Diagnosis of  By: Ermalene Searing MD, [name]     History of colonic polyps 07/25/2011   Macular degeneration of both eyes 01/21/2014   Nonmelanoma skin cancer 07/25/2011   Multiple lesions excised face/nose   Pericarditis    a. 09/2012 with effusion and tamponade, s/p window.  b.  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 SCC IN SITU TX CX3   SCC (squamous cell carcinoma) 04/12/2005   RIGHT NECK SUP. SCC IN SITU TX EXC   SCC (squamous cell carcinoma) 04/16/2013   LEFT TEMPLE SCC IN SITU TX CX3 5FU   SCC (squamous cell carcinoma) 04/16/2013   BELOW LEFT EYE SCC IN SITU TX CX3 5FU   SCC (squamous cell carcinoma) 04/16/2013   BELOW RIGHT EYE SCC IN SITU TX CX3 5FU   SCC (squamous cell carcinoma)  08/04/2014   LEFT CHEEK SCC IN SITU TX CX3 5FU   SCC (squamous cell carcinoma) 11/27/2018   LEFT TEMPLE SCC IN SITU TX WITH BX   SCC (squamous cell carcinoma)    SCC (squamous cell carcinoma)    SCC (squamous cell carcinoma)    SCC (squamous cell carcinoma) Well Diff 09/13/2016   Tip of Nose SCC WELL DIFF TX (MOH's), and RIGHT INNER EYE SUP. SCC IN SITU TX TO WATCH   SCC (squamous cell carcinoma) Well Diff 05/30/2017   Right Cheekbone (Cx3,5FU) and Under Left Eye (Cx3,5FU)   Squamous cell carcinoma in situ (SCCIS) 04/12/2005   Right Neck Inf (Cx3), Right Neck Sup (Exc), and Bulb of Nose (Cx3,Exc)   Squamous cell carcinoma in situ (SCCIS) 09/27/2005   Nose (Cx3,Exc)   Squamous cell carcinoma in situ (SCCIS) 02/18/2013   Below Left Eye (tx p bx)   Squamous cell carcinoma in situ (SCCIS) 04/16/2013   Left Temple (Cx3,5FU), Below Left Eye (Cx3,5FU), Below Right Eye (Cx3,5FU)   Squamous cell carcinoma in situ (SCCIS) 08/04/2014   Left Cheek (Cx3,5FU)   Squamous cell carcinoma in situ (SCCIS) 09/13/2016   Right Inner Eye Sup. 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   Type 2 diabetes mellitus with vascular disease 05/21/2007   Qualifier: Diagnosis of  By: Ermalene Searing MD, [name]     Type 2 DM with CKD stage 3 and hypertension 07/16/2015   Unspecified essential hypertension    Past Surgical History:  Past Surgical History:  Procedure Laterality Date   HERNIA REPAIR     LEFT HEART CATHETERIZATION WITH CORONARY ANGIOGRAM N/A 09/13/2012   Procedure: LEFT HEART CATHETERIZATION WITH CORONARY ANGIOGRAM;  Surgeon: Tonny Bollman, MD;  Location[facility] CATH LAB;  Service: Cardiovascular;  Laterality: N/A;   LOBECTOMY   2001   upper left   PERICARDIAL TAP N/A 09/16/2012   Procedure: PERICARDIAL TAP;  Surgeon: Lesleigh Noe, MD;  Location: [facility] CATH LAB;  Service: Cardiovascular;  Laterality: N/A;   PROSTATECTOMY  2003   SUBXYPHOID PERICARDIAL WINDOW N/A 09/16/2012   Procedure: SUBXYPHOID PERICARDIAL WINDOW;  Surgeon: Purcell Nails, MD;  Location: [facility] OR;  Service: Thoracic;  Laterality: N/A;   TONSILLECTOMY      Social History:  reports that he quit smoking about 38 years ago. His smoking use included cigarettes. He started smoking about 69 years ago.  He has a 84.00 pack-year smoking history. He quit smokeless tobacco use about 36 years ago.  His smokeless tobacco use included chew. He reports that he does not drink alcohol and does not use drugs. Family History:  Family History  Problem Relation Age of Onset   Cancer Father        colon   Dementia Mother    Diabetes Mother    Cancer Brother        lung     HOME MEDICATIONS: Allergies as of 07/18/2022       Reactions   Sulfa Antibiotics Nausea Only        Medication List        Accurate as of July 18, 2022  1:30 PM. If you have any questions, ask your nurse or doctor.          atorvastatin 20 MG tablet Commonly known as: LIPITOR TAKE 1 TABLET BY MOUTH EVERY DAY   CVS VITAMIN B12 1000 MCG tablet Generic drug: cyanocobalamin TAKE 1 TABLET BY MOUTH EVERY DAY   donepezil 10 MG tablet Commonly known as: ARICEPT TAKE 1 TABLET BY MOUTH EVERYDAY AT BEDTIME   ferrous sulfate 325 (65 FE) MG tablet TAKE 1 TABLET BY MOUTH EVERY DAY WITH BREAKFAST   FreeStyle Libre 2 Reader Devi Use to check blood sugar continous   FreeStyle Libre 2 Sensor Misc USE TO CHECK BLOOD SUGAR CONTINUOUS   glipiZIDE 10 MG tablet Commonly known as: GLUCOTROL TAKE 1 TABLET BY MOUTH DAILY BEFORE BREAKFAST.   ICaps Lutein & Zeaxanthin Tbec Take 1 tablet by mouth 2 (two) times daily.   Ozempic (0.25 or 0.5 MG/DOSE) 2 MG/3ML Sopn Generic drug:  Semaglutide(0.25 or 0.5MG /DOS) 0.5 mg by Other route once a week. Inject 0.25 mg once a week for 4 weeks, then increase to 0.5 mg once a week   pantoprazole 40 MG tablet Commonly known as: PROTONIX TAKE 1 TABLET BY MOUTH EVERY DAY   sertraline 100 MG tablet Commonly known as: ZOLOFT TAKE 2 TABLETS BY MOUTH EVERY DAY   Systane Balance 0.6 % Soln Generic drug: Propylene Glycol Apply 1 drop to eye 2 (two) times daily as needed (dry eyes).   tamsulosin 0.4 MG Caps capsule Commonly known as: FLOMAX TAKE 1 CAPSULE BY MOUTH EVERY DAY AFTER SUPPER   triamcinolone cream 0.1 % Commonly known as: KENALOG Apply 1 application topically 2 (two) times daily.         ALLERGIES: Allergies  Allergen Reactions   Sulfa Antibiotics Nausea Only     REVIEW OF SYSTEMS: A comprehensive ROS was conducted with the patient and is negative except as per HPI    OBJECTIVE:   VITAL SIGNS: BP 118/70 (BP Location: Left Arm, Patient Position: Sitting, Cuff Size: Small)   Pulse 78   Ht 5\' 9"  (1.753 m)   Wt 188 lb (85.3 kg)   SpO2 99%   BMI 27.76 kg/m    PHYSICAL EXAM:  General: Pt appears well and is in NAD In a wheelchair  Lungs: Clear with good BS bilat with no rales, rhonchi, or wheezes  Heart: RRR   Extremities:  trace pretibial edema  Neuro: MS is good with appropriate affect, pt is alert and Ox3    Dm Foot Exam 07/18/2022  The skin of the feet is without sores or ulcerations, nails are discolored and thickened The pedal pulses are 1+ on right and 1+ on left. The sensation is decreased  to a screening 5.07, 10 gram  monofilament bilaterally  DATA REVIEWED:  Lab Results  Component Value Date   HGBA1C 6.4 (A) 01/14/2022   HGBA1C 7.2 (A) 09/29/2021   HGBA1C 8.3 (H) 03/08/2021    Latest Reference Range & Units 04/25/22 14:40  Sodium 135 - 145 mEq/L 138  Potassium 3.5 - 5.1 mEq/L 4.6  Chloride 96 - 112 mEq/L 102  CO2 19 - 32 mEq/L 27  Glucose 70 - 99 mg/dL 161 (H)  BUN 6 - 23  mg/dL 25 (H)  Creatinine 0.96 - 1.50 mg/dL 0.45 (H)  Calcium 8.4 - 10.5 mg/dL 9.4  Alkaline Phosphatase 39 - 117 U/L 96  Albumin 3.5 - 5.2 g/dL 4.3  AST 0 - 37 U/L 11  ALT 0 - 53 U/L 12  Total Protein 6.0 - 8.3 g/dL 7.2  Bilirubin, Direct 0.0 - 0.3 mg/dL 0.0  Total Bilirubin 0.2 - 1.2 mg/dL 0.4  GFR >40.98 mL/min 36.58 (L)  (H): Data is abnormally high (L): Data is abnormally low  ASSESSMENT / PLAN / RECOMMENDATIONS:   1) Type 2 Diabetes Mellitus, Optimally controlled, With CKD III and macrovascular  complications - Most recent A1c of 6.6 %. Goal A1c < 8.0 %.     -His A1c is skewed due to CKD -Hypoglycemia have been  reduced  -Patient is intolerant to metformin, as well as low GFR - No changes at this time    MEDICATIONS: Continue glipizide 10 mg daily Continue Ozempic 0.5 mg weekly  EDUCATION / INSTRUCTIONS: BG monitoring instructions: Patient is instructed to check his blood sugars 3 times a day, before meals. Call [place] [facility] clinic if: BG persistently < 70  I reviewed the Rule of 15 for the treatment of hypoglycemia in detail with the patient. Literature supplied.   2) Diabetic complications:  Eye: Does not have known diabetic retinopathy.  Neuro/ Feet: Does not have known diabetic peripheral neuropathy. Renal: Patient does  have known baseline CKD. He is not on an ACEI/ARB at present.      Follow-up in 3 months     Signed electronically by: Lyndle Herrlich, MD  [facility] Endocrinology  [facility] [address] [place] [place], [place] [address] Phone: [phone] FAX: [phone]   CC: Hannah Beat, MD [address] [place] [address] Phone: [phone]  Fax: [phone]    Return to Endocrinology clinic as below: Future Appointments  Date Time Provider Department Center  08/24/2022  2:00 PM [facility] ANNUAL WELLNESS VISIT 1 [facility]

## 2022-08-25 ENCOUNTER — Other Ambulatory Visit: Payer: Self-pay | Admitting: Family Medicine

## 2022-08-25 NOTE — Telephone Encounter (Signed)
Please call and schedule MWV with nurse and CPE with fasting labs prior with Dr. Patsy Lager for around 10/24/2022.

## 2022-08-25 NOTE — Telephone Encounter (Signed)
Spoke to pt's wife, scheduled cpe for 09/28/22

## 2022-09-08 ENCOUNTER — Telehealth: Payer: Self-pay

## 2022-09-08 NOTE — Progress Notes (Unsigned)
Care Management & Coordination Services Pharmacy Team  Reason for Encounter: Diabetes  Contacted patient to discuss diabetes disease state. {US HC Outreach:28874}  Current antihyperglycemic regimen:  Glipizide 10 mg  Ozempic 0.5  mg weekly  (Thursdays) CGM- Freestyle Libre     Patient verbally confirms he is taking the above medications as directed. {yes/no:20286}  What diet changes have been made to improve diabetes control?  What recent interventions/DTPs have been made to improve glycemic control:  ***  Have there been any recent hospitalizations or ED visits since last visit with PharmD? {yes/no:20286}  Patient {reports/denies:24182} hypoglycemic symptoms, including {Hypoglycemic Symptoms:3049003}  Patient {reports/denies:24182} hyperglycemic symptoms, including {symptoms; hyperglycemia:17903}  How often are you checking your blood sugar? {BG Testing frequency:23922}  What are your blood sugars ranging?  Fasting: *** Before meals: *** After meals: *** Bedtime: ***  During the week, how often does your blood glucose drop below 70? {LowBGfrequency:24142}  Are you checking your feet daily/regularly? {yes/no:20286}  Adherence Review: Is the patient currently on a STATIN medication? {yes/no:20286} Is the patient currently on ACE/ARB medication? {yes/no:20286} Does the patient have >5 day gap between last estimated fill dates? {yes/no:20286}   Chart Updates:  Recent office visits:  ***  Recent consult visits:  07/18/22-Ibtehal Shamleffer,MD(endo)-f/u DM,DM foot UTD,A1c 6.6,f/u 2 months  Hospital visits:  None in previous 6 months  Medications: Outpatient Encounter Medications as of 09/08/2022  Medication Sig  . atorvastatin (LIPITOR) 20 MG tablet TAKE 1 TABLET BY MOUTH EVERY DAY  . Continuous Blood Gluc Receiver (FREESTYLE LIBRE 2 READER) DEVI Use to check blood sugar continous  . Continuous Blood Gluc Sensor (FREESTYLE LIBRE 2 SENSOR) MISC USE TO CHECK BLOOD  SUGAR CONTINUOUS  . cyanocobalamin ([employer]S VITAMIN B12) 1000 MCG tablet TAKE 1 TABLET BY MOUTH EVERY DAY  . donepezil (ARICEPT) 10 MG tablet TAKE 1 TABLET BY MOUTH EVERYDAY AT BEDTIME  . ferrous sulfate 325 (65 FE) MG tablet TAKE 1 TABLET BY MOUTH EVERY DAY WITH BREAKFAST  . glipiZIDE (GLUCOTROL) 10 MG tablet Take 1 tablet (10 mg total) by mouth daily before breakfast.  . pantoprazole (PROTONIX) 40 MG tablet TAKE 1 TABLET BY MOUTH EVERY DAY  . Propylene Glycol (SYSTANE BALANCE) 0.6 % SOLN Apply 1 drop to eye 2 (two) times daily as needed (dry eyes).  . Semaglutide,0.25 or 0.5MG /DOS, (OZEMPIC, 0.25 OR 0.5 MG/DOSE,) 2 MG/3ML SOPN 0.5 mg by Other route once a week.  . sertraline (ZOLOFT) 100 MG tablet TAKE 2 TABLETS BY MOUTH EVERY DAY  . Specialty Vitamins Products (ICAPS LUTEIN & ZEAXANTHIN) TBEC Take 1 tablet by mouth 2 (two) times daily.  . tamsulosin (FLOMAX) 0.4 MG CAPS capsule TAKE 1 CAPSULE BY MOUTH EVERY DAY AFTER SUPPER  . triamcinolone cream (KENALOG) 0.1 % Apply 1 application topically 2 (two) times daily.   No facility-administered encounter medications on file as of 09/08/2022.    Recent Relevant Labs: Lab Results  Component Value Date/Time   HGBA1C 6.6 (A) 07/18/2022 01:30 PM   HGBA1C 6.4 (A) 01/14/2022 01:37 PM   HGBA1C 8.3 (H) 03/08/2021 05:53 AM   HGBA1C 7.3 (H) 05/11/2020 10:31 AM   MICROALBUR 7.8 (H) 12/20/2019 09:20 AM   MICROALBUR 3.4 (H) 10/31/2018 10:39 AM    Kidney Function Lab Results  Component Value Date/Time   CREATININE 1.65 (H) 04/25/2022 02:40 PM   CREATININE 1.81 (H) 10/20/2021 02:49 PM   CREATININE 1.5 (H) 07/30/2015 09:57 AM   CREATININE 1.2 07/16/2013 10:59 AM   GFR 36.58 (L) 04/25/2022 02:40  PM   GFRNONAA 46 (L) 05/19/2021 02:44 PM   GFRAA 58 (L) 06/25/2018 09:49 PM    Star Rating Drugs:  Medication:  Last Fill: Day Supply Ozempic  Glipizide 10mg  02/24/22 90   Care Gaps: Annual wellness visit in last year? Yes Last eye exam / retinopathy  screening:2021 Last diabetic foot exam: UTD   Al Corpus, PharmD notified  Burt Knack, [facility] Clinical Pharmacy Assistant [phone]

## 2022-09-13 ENCOUNTER — Other Ambulatory Visit: Payer: Self-pay | Admitting: Family Medicine

## 2022-09-15 ENCOUNTER — Telehealth: Payer: Self-pay

## 2022-09-15 NOTE — Telephone Encounter (Signed)
Patient will come by and complete the [employer] application for [employer]

## 2022-09-15 NOTE — Telephone Encounter (Signed)
Patient filled out Surveyor, quantity. Paperwork in Water engineer.

## 2022-09-18 ENCOUNTER — Other Ambulatory Visit: Payer: Self-pay | Admitting: Family Medicine

## 2022-09-18 DIAGNOSIS — C61 Malignant neoplasm of prostate: Secondary | ICD-10-CM

## 2022-09-18 DIAGNOSIS — E7800 Pure hypercholesterolemia, unspecified: Secondary | ICD-10-CM

## 2022-09-18 DIAGNOSIS — Z79899 Other long term (current) drug therapy: Secondary | ICD-10-CM

## 2022-09-18 DIAGNOSIS — E1159 Type 2 diabetes mellitus with other circulatory complications: Secondary | ICD-10-CM

## 2022-09-18 DIAGNOSIS — E538 Deficiency of other specified B group vitamins: Secondary | ICD-10-CM

## 2022-09-18 DIAGNOSIS — N1831 Chronic kidney disease, stage 3a: Secondary | ICD-10-CM

## 2022-09-20 DIAGNOSIS — H43813 Vitreous degeneration, bilateral: Secondary | ICD-10-CM | POA: Diagnosis not present

## 2022-09-20 DIAGNOSIS — H353211 Exudative age-related macular degeneration, right eye, with active choroidal neovascularization: Secondary | ICD-10-CM | POA: Diagnosis not present

## 2022-09-22 ENCOUNTER — Other Ambulatory Visit (INDEPENDENT_AMBULATORY_CARE_PROVIDER_SITE_OTHER): Payer: PPO

## 2022-09-22 DIAGNOSIS — Z7985 Long-term (current) use of injectable non-insulin antidiabetic drugs: Secondary | ICD-10-CM

## 2022-09-22 DIAGNOSIS — E538 Deficiency of other specified B group vitamins: Secondary | ICD-10-CM

## 2022-09-22 DIAGNOSIS — E7800 Pure hypercholesterolemia, unspecified: Secondary | ICD-10-CM

## 2022-09-22 DIAGNOSIS — E1159 Type 2 diabetes mellitus with other circulatory complications: Secondary | ICD-10-CM | POA: Diagnosis not present

## 2022-09-22 DIAGNOSIS — Z79899 Other long term (current) drug therapy: Secondary | ICD-10-CM | POA: Diagnosis not present

## 2022-09-22 DIAGNOSIS — C61 Malignant neoplasm of prostate: Secondary | ICD-10-CM | POA: Diagnosis not present

## 2022-09-22 LAB — CBC WITH DIFFERENTIAL/PLATELET
Basophils Absolute: 0 10*3/uL (ref 0.0–0.1)
Basophils Relative: 0.3 % (ref 0.0–3.0)
Eosinophils Absolute: 0.1 10*3/uL (ref 0.0–0.7)
Eosinophils Relative: 0.7 % (ref 0.0–5.0)
HCT: 40.8 % (ref 39.0–52.0)
Hemoglobin: 13.5 g/dL (ref 13.0–17.0)
Lymphocytes Relative: 7.7 % — ABNORMAL LOW (ref 12.0–46.0)
Lymphs Abs: 0.6 10*3/uL — ABNORMAL LOW (ref 0.7–4.0)
MCHC: 33.1 g/dL (ref 30.0–36.0)
MCV: 99.5 fl (ref 78.0–100.0)
Monocytes Absolute: 1.7 10*3/uL — ABNORMAL HIGH (ref 0.1–1.0)
Monocytes Relative: 20.5 % — ABNORMAL HIGH (ref 3.0–12.0)
Neutro Abs: 5.8 10*3/uL (ref 1.4–7.7)
Neutrophils Relative %: 70.8 % (ref 43.0–77.0)
Platelets: 129 10*3/uL — ABNORMAL LOW (ref 150.0–400.0)
RBC: 4.1 Mil/uL — ABNORMAL LOW (ref 4.22–5.81)
RDW: 13.2 % (ref 11.5–15.5)
WBC: 8.1 10*3/uL (ref 4.0–10.5)

## 2022-09-22 LAB — BASIC METABOLIC PANEL
BUN: 24 mg/dL — ABNORMAL HIGH (ref 6–23)
CO2: 26 mEq/L (ref 19–32)
Calcium: 9.2 mg/dL (ref 8.4–10.5)
Chloride: 104 mEq/L (ref 96–112)
Creatinine, Ser: 1.75 mg/dL — ABNORMAL HIGH (ref 0.40–1.50)
GFR: 33.99 mL/min — ABNORMAL LOW (ref >60.00)
Glucose, Bld: 205 mg/dL — ABNORMAL HIGH (ref 70–99)
Potassium: 4.6 mEq/L (ref 3.5–5.1)
Sodium: 139 mEq/L (ref 135–145)

## 2022-09-22 LAB — LIPID PANEL
Cholesterol: 107 mg/dL (ref 0–200)
HDL: 32.3 mg/dL — ABNORMAL LOW (ref >39.00)
NonHDL: 74.21
Total CHOL/HDL Ratio: 3
Triglycerides: 208 mg/dL — ABNORMAL HIGH (ref 0.0–149.0)
VLDL: 41.6 mg/dL — ABNORMAL HIGH (ref 0.0–40.0)

## 2022-09-22 LAB — HEPATIC FUNCTION PANEL
ALT: 16 U/L (ref 0–53)
AST: 15 U/L (ref 0–37)
Albumin: 4.2 g/dL (ref 3.5–5.2)
Alkaline Phosphatase: 93 U/L (ref 39–117)
Bilirubin, Direct: 0.2 mg/dL (ref 0.0–0.3)
Total Bilirubin: 0.6 mg/dL (ref 0.2–1.2)
Total Protein: 7.1 g/dL (ref 6.0–8.3)

## 2022-09-22 LAB — LDL CHOLESTEROL, DIRECT: Direct LDL: 55 mg/dL

## 2022-09-22 LAB — VITAMIN B12: Vitamin B-12: 574 pg/mL (ref 211–911)

## 2022-09-22 LAB — HEMOGLOBIN A1C: Hgb A1c MFr Bld: 7.1 % — ABNORMAL HIGH (ref 4.6–6.5)

## 2022-09-22 LAB — MICROALBUMIN / CREATININE URINE RATIO
Creatinine,U: 352.4 mg/dL
Microalb Creat Ratio: 6 mg/g (ref 0.0–30.0)
Microalb, Ur: 21 mg/dL — ABNORMAL HIGH (ref 0.0–1.9)

## 2022-09-23 LAB — PSA, TOTAL WITH REFLEX TO PSA, FREE: PSA, Total: <0.1 ng/mL (ref <=4.0)

## 2022-09-29 ENCOUNTER — Ambulatory Visit (INDEPENDENT_AMBULATORY_CARE_PROVIDER_SITE_OTHER): Payer: PPO | Admitting: Family Medicine

## 2022-09-29 ENCOUNTER — Encounter: Payer: Self-pay | Admitting: Family Medicine

## 2022-09-29 VITALS — BP 138/68 | HR 57 | Temp 97.7°F | Ht 69.0 in | Wt 184.2 lb

## 2022-09-29 DIAGNOSIS — Z0000 Encounter for general adult medical examination without abnormal findings: Secondary | ICD-10-CM

## 2022-09-29 NOTE — Progress Notes (Signed)
Jennet Scroggin T. Jayvier Burgher, MD, CAQ Sports Medicine [facility] [address] [place], [address]  Phone: [phone]  FAX: [phone]  [name] - 89 y.o. male  MRN [id number]  Date of Birth: 05-31-1930  Date: 09/29/2022  PCP: Hannah Beat, MD  Referral: Hannah Beat, MD  Chief Complaint  Patient presents with   Medicare Wellness   Patient Care Team: Hannah Beat, MD as PCP - General (Family Medicine) Tonny Bollman, MD as PCP - Cardiology (Cardiology) Janalyn Harder, MD (Inactive) as Consulting Physician (Dermatology) Kathyrn Sheriff, [facility] (Inactive) as Pharmacist (Pharmacist) Subjective:   [name] is a 89 y.o. pleasant patient who presents for a medicare wellness examination:  Preventative Health Maintenance Visit:  Health Maintenance Summary Reviewed and updated, unless pt declines services.  Tobacco History Reviewed. Alcohol: No concerns, no excessive use Exercise Habits: Some activity, rec at least 30 mins 5 times a week - able to do minimal STD concerns: no risk or activity to increase risk Drug Use: None  Diabetes Mellitus: Tolerating Medications: yes Compliance with diet: fair, Body mass index is 27.21 kg/m. Exercise: minimal / intermittent Avg blood sugars at home: not checking Foot problems: none Hypoglycemia: none No nausea, vomitting, blurred vision, polyuria.  Lab Results  Component Value Date   HGBA1C 7.1 (H) 09/22/2022   HGBA1C 6.6 (A) 07/18/2022   HGBA1C 6.4 (A) 01/14/2022   Lab Results  Component Value Date   MICROALBUR 21.0 (H) 09/22/2022   LDLCALC 83 03/08/2021   CREATININE 1.75 (H) 09/22/2022    Wt Readings from Last 3 Encounters:  09/29/22 184 lb 4 oz (83.6 kg)  07/18/22 188 lb (85.3 kg)  04/25/22 184 lb 6 oz (83.6 kg)    HTN: Tolerating all medications without side effects Stable and at goal No CP, no sob. No HA.  BP Readings from Last 3 Encounters:   09/29/22 138/68  07/18/22 118/70  04/25/22 114/70    Basic Metabolic Panel:    Component Value Date/Time   NA 139 09/22/2022 0858   NA 143 07/30/2015 0957   K 4.6 09/22/2022 0858   K 5.3 No visable hemolysis (H) 07/30/2015 0957   CL 104 09/22/2022 0858   CL 103 07/20/2012 1046   CO2 26 09/22/2022 0858   CO2 28 07/30/2015 0957   BUN 24 (H) 09/22/2022 0858   BUN 14.4 07/30/2015 0957   CREATININE 1.75 (H) 09/22/2022 0858   CREATININE 1.5 (H) 07/30/2015 0957   GLUCOSE 205 (H) 09/22/2022 0858   GLUCOSE 231 (H) 07/30/2015 0957   GLUCOSE 215 (H) 07/20/2012 1046   CALCIUM 9.2 09/22/2022 0858   CALCIUM 9.5 07/30/2015 0957    Lipids: Doing well, stable. Tolerating meds fine with no SE. Panel reviewed with patient.  Lipids: Lab Results  Component Value Date   CHOL 107 09/22/2022   Lab Results  Component Value Date   HDL 32.30 (L) 09/22/2022   Lab Results  Component Value Date   LDLCALC 83 03/08/2021   Lab Results  Component Value Date   TRIG 208.0 (H) 09/22/2022   Lab Results  Component Value Date   CHOLHDL 3 09/22/2022    Lab Results  Component Value Date   ALT 16 09/22/2022   AST 15 09/22/2022   ALKPHOS 93 09/22/2022   BILITOT 0.6 09/22/2022    He also has known significant squamous cell carcinoma of the face.  At this point, dermatology is watching this, and has not really  progressed.  He is lost much of his site, so he is highly reluctant to have any kind of surgery that may affect his eyesight.  Health Maintenance  Topic Date Due   Zoster Vaccines- Shingrix (1 of 2) Never done   OPHTHALMOLOGY EXAM  04/23/2020   COVID-19 Vaccine (4 - 2023-24 season) 12/03/2021   Medicare Annual Wellness (AWV)  08/11/2022   INFLUENZA VACCINE  11/03/2022   DTaP/Tdap/Td (3 - Td or Tdap) 01/26/2023   HEMOGLOBIN A1C  03/24/2023   FOOT EXAM  07/18/2023   Pneumonia Vaccine 64+ Years old  Completed   HPV VACCINES  Aged Out    Immunization History  Administered Date(s)  Administered   Fluad Quad(high Dose 65+) 12/26/2019, 03/13/2021, 01/14/2022   Influenza Whole 01/03/2007   Influenza, High Dose Seasonal PF 01/19/2018, 03/06/2019   Influenza,inj,Quad PF,6+ Mos 02/06/2013, 01/20/2014, 01/15/2015, 01/29/2016   Influenza-Unspecified 01/02/2017   Moderna Sars-Covid-2 Vaccination 06/03/2019, 07/04/2019, 03/19/2020   Pneumococcal Conjugate-13 01/20/2014   Pneumococcal Polysaccharide-23 01/02/2001   Td 04/04/1994   Tdap 01/25/2013    Patient Active Problem List   Diagnosis Date Noted   Right pontine cerebrovascular accident (HCC) 03/12/2021    Priority: High   CAD (coronary artery disease)     Priority: High   Adenocarcinoma of left lung, stage 1 (HCC) 07/25/2011    Priority: High   Cancer of prostate w/med recur risk (T2b-c or Gleason 7 or PSA 10-20) (HCC) 07/25/2011    Priority: High   Type 2 diabetes mellitus with vascular disease (HCC) 05/21/2007    Priority: High   Stage 3a chronic kidney disease (HCC) 04/26/2022    Priority: Medium    Pericardial tamponade 09/17/2012    Priority: Medium    Acute pericarditis, unspecified 09/13/2012    Priority: Medium    HYPERCHOLESTEROLEMIA 05/21/2007    Priority: Medium    ALZHEIMER'S DISEASE, MILD 01/08/2007    Priority: Medium    Essential hypertension 01/08/2007    Priority: Medium    Generalized anxiety disorder 01/08/2007    Priority: Low   Nonmelanoma skin cancer 07/25/2011    Priority: 13.   GERD (gastroesophageal reflux disease) 03/08/2021   Sensorineural hearing loss (SNHL) of both ears 05/15/2017   Macular degeneration of both eyes 01/21/2014   Tobacco abuse, 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/08/2013   History of colonic polyps 07/25/2011   CARCINOMA, SKIN, SQUAMOUS CELL 01/08/2007    Past Medical History:  Diagnosis Date   Adenocarcinoma of left lung, stage 1 (HCC) 2001   T1N0 stage I  adenoca left lung resected 01/03/00    Alzheimer's disease (HCC)    CAD (coronary artery disease) 2014   a. 09/2012  Cath: LM nl, LAD 50-60p, D1 60-70 m, D1 50ost, LCX nl, RCA min irregs, EF 55-65%.   Generalized anxiety disorder 01/08/2007   Qualifier: Diagnosis of  By: Ermalene Searing MD, [name]     History of colonic polyps 07/25/2011   Macular degeneration of both eyes 01/21/2014   Nonmelanoma skin cancer 07/25/2011   Multiple lesions excised face/nose   Pericarditis    a. 09/2012 with effusion and tamponade, s/p window.  b.  F/u Echo 09/19/12: mod LVH, EF 55%, Gr 1 DD, Tr MR, mild RVE, no residual effusion   Prostate CA (HCC) 04/2001   Gleason 7  S/P prostatectomy 04/11/01   SCC (squamous cell carcinoma of buccal mucosa) (HCC) 04/12/2005   BULB OF NOSE SCC IN SITU TX CX3 5FU, EXC   SCC (squamous cell carcinoma) 02/18/2013  BELOW LEFT EYE SCC IN SITU TX WITH BX   SCC (squamous cell carcinoma) 08/27/2008   RIGHT OUTER BROW FOCAL IN SITU TX WITH BX   SCC (squamous cell carcinoma) 08/27/2008   BELOW LEFT EYE FOCAL IN SITU TX CX3 5FU   SCC (squamous cell carcinoma) 10/25/2006   RIGHT ELBOW SCC IN SITU TX WITH BX CX3 5FU   SCC (squamous cell carcinoma) 04/12/2005   RIGHT NECK INF. SCC IN SITU TX CX3   SCC (squamous cell carcinoma) 04/12/2005   RIGHT NECK SUP. SCC IN SITU TX EXC   SCC (squamous cell carcinoma) 04/16/2013   LEFT TEMPLE SCC IN SITU TX CX3 5FU   SCC (squamous cell carcinoma) 04/16/2013   BELOW LEFT EYE SCC IN SITU TX CX3 5FU   SCC (squamous cell carcinoma) 04/16/2013   BELOW RIGHT EYE SCC IN SITU TX CX3 5FU   SCC (squamous cell carcinoma) 08/04/2014   LEFT CHEEK SCC IN SITU TX CX3 5FU   SCC (squamous cell carcinoma) 11/27/2018   LEFT TEMPLE SCC IN SITU TX WITH BX   SCC (squamous cell carcinoma)    SCC (squamous cell carcinoma)    SCC (squamous cell carcinoma)    SCC (squamous cell carcinoma) Well Diff 09/13/2016   Tip of Nose SCC WELL DIFF TX (MOH's), and RIGHT INNER EYE SUP. SCC IN SITU TX TO WATCH   SCC (squamous cell carcinoma) Well Diff 05/30/2017   Right Cheekbone (Cx3,5FU) and Under Left  Eye (Cx3,5FU)   Squamous cell carcinoma in situ (SCCIS) 04/12/2005   Right Neck Inf (Cx3), Right Neck Sup (Exc), and Bulb of Nose (Cx3,Exc)   Squamous cell carcinoma in situ (SCCIS) 09/27/2005   Nose (Cx3,Exc)   Squamous cell carcinoma in situ (SCCIS) 02/18/2013   Below Left Eye (tx p bx)   Squamous cell carcinoma in situ (SCCIS) 04/16/2013   Left Temple (Cx3,5FU), Below Left Eye (Cx3,5FU), Below Right Eye (Cx3,5FU)   Squamous cell carcinoma in situ (SCCIS) 08/04/2014   Left Cheek (Cx3,5FU)   Squamous cell carcinoma in situ (SCCIS) 09/13/2016   Right Inner Eye Sup. 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   Type 2 diabetes mellitus with vascular disease (HCC) 05/21/2007   Qualifier: Diagnosis of  By: Ermalene Searing MD, [name]     Type 2 DM with CKD stage 3 and hypertension (HCC) 07/16/2015   Unspecified essential hypertension     Past Surgical History:  Procedure Laterality Date   HERNIA REPAIR     LEFT HEART CATHETERIZATION WITH CORONARY ANGIOGRAM N/A 09/13/2012   Procedure: LEFT HEART CATHETERIZATION WITH CORONARY ANGIOGRAM;  Surgeon: Tonny Bollman, MD;  Location: [facility] CATH LAB;  Service: Cardiovascular;  Laterality: N/A;   LOBECTOMY  2001   upper left   PERICARDIAL TAP N/A 09/16/2012   Procedure: PERICARDIAL TAP;  Surgeon: Lesleigh Noe, MD;  Location: [facility] CATH LAB;  Service: Cardiovascular;  Laterality: N/A;   PROSTATECTOMY  2003   SUBXYPHOID PERICARDIAL WINDOW N/A 09/16/2012   Procedure: SUBXYPHOID PERICARDIAL WINDOW;  Surgeon: Purcell Nails, MD;  Location: [facility] OR;  Service: Thoracic;  Laterality: N/A;   TONSILLECTOMY      Family History  Problem Relation Age of Onset   Cancer Father        colon    Dementia Mother    Diabetes Mother    Cancer Brother        lung    Social History   Social History Narrative   Lives in [place], [place]   Regular exercise--no, mowing grass   Diet: fruit and veggies    Past Medical History, Surgical History, Social History, Family History, Problem List, Medications, and Allergies have been reviewed and updated if relevant.  Review of Systems: Pertinent positives are listed above.  Otherwise, a full 14 point review of systems has been done in full and it is negative except where it is noted positive.  Objective:   BP 138/68 (BP Location: Right Arm, Patient Position: Sitting, Cuff Size: Large)   Pulse (!) 57   Temp 97.7 F (36.5 C) (Temporal)   Ht 5\' 9"  (1.753 m)   Wt 184 lb 4 oz (83.6 kg)   SpO2 98%   BMI 27.21 kg/m     05/19/2021    4:34 PM 08/10/2021    2:16 PM 08/17/2021    2:06 PM 11/19/2021   10:54 PM 04/25/2022    2:19 PM  Fall Risk  Falls in the past year?  1 0  0  Was there an injury with Fall?  0   0  Fall Risk Category Calculator  1   0  Fall Risk Category (Retired)  Low     (RETIRED) Patient Fall Risk Level High fall risk Low fall risk  High fall risk   Patient at Risk for Falls Due to  History of fall(s)   Impaired mobility  Fall risk Follow up  Falls evaluation completed;Falls prevention discussed   Falls evaluation completed   Ideal Body Weight: Weight in (lb) to have BMI = 25: 168.9 Hearing Screening - Comments:: Wears Bilateral Hearing Aides Vision Screening - Comments:: Sees Dr. Lelon Perla --Patient only has peripheral vision    04/25/2022    2:19 PM 08/17/2021    2:06 PM 08/10/2021    2:14 PM 05/18/2021    2:00 PM 04/13/2021   12:58 PM  Depression screen PHQ 2/9  Decreased Interest 0 0 0 0 0  Down, Depressed, Hopeless 0 1 0 0 1  PHQ - 2 Score 0 1 0 0 1  Altered sleeping     0  Tired, decreased energy     1  Change in appetite     0  Feeling bad or failure about yourself      0  Trouble concentrating     0   Moving slowly or fidgety/restless     0  Suicidal thoughts     0  PHQ-9 Score     2     GEN: well developed, well nourished, no acute distress Eyes: conjunctiva and lids normal, PERRLA, EOMI ENT: TM clear, nares clear, oral exam WNL Neck: supple, no lymphadenopathy, no thyromegaly, no JVD Pulm: clear to auscultation and percussion, respiratory effort normal CV: regular rate and rhythm, S1-S2, no murmur, rub or gallop, no bruits, peripheral pulses normal and symmetric, no cyanosis, clubbing, edema or varicosities GI: soft, non-tender; no hepatosplenomegaly, masses; active bowel sounds all quadrants GU: deferred Lymph: no cervical, axillary or inguinal adenopathy MSK: gait normal, muscle tone and strength WNL, no joint swelling, effusions, discoloration, crepitus  SKIN: clear, good turgor, color WNL, no rashes, lesions, or ulcerations Neuro: normal mental status, normal strength, sensation,  and motion Psych: alert; oriented to person, place and time, normally interactive and not anxious or depressed in appearance.  All labs reviewed with patient.  Results for orders placed or performed in visit on 09/22/22  Vitamin B12  Result Value Ref Range   Vitamin B-12 574 211 - 911 pg/mL  PSA, Total with Reflex to PSA, Free  Result Value Ref Range   PSA, Total <0.1 < OR = 4.0 ng/mL  Microalbumin / creatinine urine ratio  Result Value Ref Range   Microalb, Ur 21.0 (H) 0.0 - 1.9 mg/dL   Creatinine,U 409.8 mg/dL   Microalb Creat Ratio 6.0 0.0 - 30.0 mg/g  Lipid panel  Result Value Ref Range   Cholesterol 107 0 - 200 mg/dL   Triglycerides 119.1 (H) 0.0 - 149.0 mg/dL   HDL 47.82 (L) >95.62 mg/dL   VLDL 13.0 (H) 0.0 - 86.5 mg/dL   Total CHOL/HDL Ratio 3    NonHDL 74.21   Hemoglobin A1c  Result Value Ref Range   Hgb A1c MFr Bld 7.1 (H) 4.6 - 6.5 %  Hepatic function panel  Result Value Ref Range   Total Bilirubin 0.6 0.2 - 1.2 mg/dL   Bilirubin, Direct 0.2 0.0 - 0.3 mg/dL   Alkaline  Phosphatase 93 39 - 117 U/L   AST 15 0 - 37 U/L   ALT 16 0 - 53 U/L   Total Protein 7.1 6.0 - 8.3 g/dL   Albumin 4.2 3.5 - 5.2 g/dL  CBC with Differential/Platelet  Result Value Ref Range   WBC 8.1 4.0 - 10.5 K/uL   RBC 4.10 (L) 4.22 - 5.81 Mil/uL   Hemoglobin 13.5 13.0 - 17.0 g/dL   HCT 78.4 69.6 - 29.5 %   MCV 99.5 78.0 - 100.0 fl   MCHC 33.1 30.0 - 36.0 g/dL   RDW 28.4 13.2 - 44.0 %   Platelets 129.0 (L) 150.0 - 400.0 K/uL   Neutrophils Relative % 70.8 43.0 - 77.0 %   Lymphocytes Relative 7.7 Repeated and verified X2. (L) 12.0 - 46.0 %   Monocytes Relative 20.5 Repeated and verified X2. (H) 3.0 - 12.0 %   Eosinophils Relative 0.7 0.0 - 5.0 %   Basophils Relative 0.3 0.0 - 3.0 %   Neutro Abs 5.8 1.4 - 7.7 K/uL   Lymphs Abs 0.6 (L) 0.7 - 4.0 K/uL   Monocytes Absolute 1.7 (H) 0.1 - 1.0 K/uL   Eosinophils Absolute 0.1 0.0 - 0.7 K/uL   Basophils Absolute 0.0 0.0 - 0.1 K/uL  Basic metabolic panel  Result Value Ref Range   Sodium 139 135 - 145 mEq/L   Potassium 4.6 3.5 - 5.1 mEq/L   Chloride 104 96 - 112 mEq/L   CO2 26 19 - 32 mEq/L   Glucose, Bld 205 (H) 70 - 99 mg/dL   BUN 24 (H) 6 - 23 mg/dL   Creatinine, Ser 1.02 (H) 0.40 - 1.50 mg/dL   GFR 72.53 (L) >66.44 mL/min   Calcium 9.2 8.4 - 10.5 mg/dL  LDL cholesterol, direct  Result Value Ref Range   Direct LDL 55.0 mg/dL    Assessment and Plan:     ICD-10-CM   1. Healthcare maintenance  Z00.00      He is doing fairly well for age.  He has ultimately recovered fairly nicely from his prior major stroke, he does have some weakness still in the left lower extremity, he is able to walk okay without his AFO.  A big limiting factor  is his eyesight, and this continues to worsen.  Ongoing known squamous cell carcinoma of the face near the eyes.  They want to watch this without doing any kind of surgery.  Dermatology does follow routinely.  We will continue to watch his renal function. Blood sugar has been fairly  well-controlled  Health Maintenance Exam: The patient's preventative maintenance and recommended screening tests for an annual wellness exam were reviewed in full today. Brought up to date unless services declined.  Counselled on the importance of diet, exercise, and its role in overall health and mortality. The patient's FH and SH was reviewed, including their home life, tobacco status, and drug and alcohol status.  Follow-up in 1 year for physical exam or additional follow-up below.  I have personally reviewed the Medicare Annual Wellness questionnaire and have noted 1. The patient's medical and social history 2. Their use of alcohol, tobacco or illicit drugs 3. Their current medications and supplements 4. The patient's functional ability including ADL's, fall risks, home safety risks and hearing or visual             impairment. 5. Diet and physical activities 6. Evidence for depression or mood disorders 7. Reviewed Updated provider list, see scanned forms and [facility] Snapshot.  8. Reviewed whether or not the patient has HCPOA or living will, and discussed what this means with the patient.  Recommended he bring in a copy for his chart in [facility].  The patients weight, height, BMI and visual acuity have been recorded in the chart I have made referrals, counseling and provided education to the patient based review of the above and I have provided the pt with a written personalized care plan for preventive services.  I have provided the patient with a copy of your personalized plan for preventive services. Instructed to take the time to review along with their updated medication list.  Disposition: 6 months  Future Appointments  Date Time Provider Department Center  10/05/2022  2:45 PM Helane Gunther, DPM [facility]  01/24/2023  1:20 PM Shamleffer, Konrad Dolores, MD [facility] None    No orders of the defined types were placed in this encounter.  Medications Discontinued During  This Encounter  Medication Reason   cyanocobalamin ([employer] VITAMIN B12) 1000 MCG tablet Completed Course   No orders of the defined types were placed in this encounter.   Signed,  Elpidio Galea. Jovon Streetman, MD   Allergies as of 09/29/2022       Reactions   Sulfa Antibiotics Nausea Only        Medication List        Accurate as of September 29, 2022  2:17 PM. If you have any questions, ask your nurse or doctor.          STOP taking these medications    [employer] VITAMIN B12 1000 MCG tablet Generic drug: cyanocobalamin Stopped by: Hannah Beat, MD       TAKE these medications    atorvastatin 20 MG tablet Commonly known as: LIPITOR TAKE 1 TABLET BY MOUTH EVERY DAY   donepezil 10 MG tablet Commonly known as: ARICEPT TAKE 1 TABLET BY MOUTH EVERYDAY AT BEDTIME   ferrous sulfate 325 (65 FE) MG tablet TAKE 1 TABLET BY MOUTH EVERY DAY WITH BREAKFAST   FreeStyle Libre 2 Reader Devi Use to check blood sugar continous   FreeStyle Libre 2 Sensor Misc USE TO CHECK BLOOD SUGAR CONTINUOUS   glipiZIDE 10 MG tablet Commonly known as: GLUCOTROL Take 1 tablet (10  mg total) by mouth daily before breakfast.   ICaps Lutein & Zeaxanthin Tbec Take 1 tablet by mouth 2 (two) times daily.   Ozempic (0.25 or 0.5 MG/DOSE) 2 MG/3ML Sopn Generic drug: Semaglutide(0.25 or 0.5MG /DOS) 0.5 mg by Other route once a week.   pantoprazole 40 MG tablet Commonly known as: PROTONIX TAKE 1 TABLET BY MOUTH EVERY DAY   sertraline 100 MG tablet Commonly known as: ZOLOFT TAKE 1 TABLET BY MOUTH EVERY DAY   Systane Balance 0.6 % Soln Generic drug: Propylene Glycol Apply 1 drop to eye 2 (two) times daily as needed (dry eyes).   tamsulosin 0.4 MG Caps capsule Commonly known as: FLOMAX TAKE 1 CAPSULE BY MOUTH EVERY DAY AFTER SUPPER   triamcinolone cream 0.1 % Commonly known as: KENALOG Apply 1 application topically 2 (two) times daily.

## 2022-10-05 ENCOUNTER — Ambulatory Visit (INDEPENDENT_AMBULATORY_CARE_PROVIDER_SITE_OTHER): Payer: PPO | Admitting: Podiatry

## 2022-10-05 ENCOUNTER — Encounter: Payer: Self-pay | Admitting: Podiatry

## 2022-10-05 DIAGNOSIS — M79675 Pain in left toe(s): Secondary | ICD-10-CM | POA: Diagnosis not present

## 2022-10-05 DIAGNOSIS — M79674 Pain in right toe(s): Secondary | ICD-10-CM

## 2022-10-05 DIAGNOSIS — B351 Tinea unguium: Secondary | ICD-10-CM

## 2022-10-05 DIAGNOSIS — E1151 Type 2 diabetes mellitus with diabetic peripheral angiopathy without gangrene: Secondary | ICD-10-CM

## 2022-10-05 NOTE — Progress Notes (Signed)
This patient returns to my office for at risk foot care.  This patient requires this care by a professional since this patient will be at risk due to having diabetes with vascular disease.  Patient also has chronic kidney disease.       This patient is unable to cut nails himself since the patient cannot reach his nails.These nails are painful walking and wearing shoes.  This patient presents for at risk foot care today.  General Appearance  Alert, conversant and in no acute stress.  Vascular  Dorsalis pedis  pulses are weakly  palpable  bilaterally. Posterior tibial pulses are absent  B/L.   Capillary return is within normal limits  bilaterally. Cold feet   bilaterally.  Neurologic  Senn-Weinstein monofilament wire test within normal limits  bilaterally. Muscle power within normal limits bilaterally.  Nails Thick disfigured discolored nails with subungual debris  from hallux to fifth toes bilaterally. No evidence of bacterial infection or drainage bilaterally.  Orthopedic  No limitations of motion  feet .  No crepitus or effusions noted.  No bony pathology or digital deformities noted.  Skin  normotropic skin with no porokeratosis noted bilaterally.  No signs of infections or ulcers noted.     Onychomycosis  Pain in right toes  Pain in left toes  Consent was obtained for treatment procedures.   Mechanical debridement of nails 1-5  bilaterally performed with a nail nipper.  Filed with dremel without incident.    Return office visit  4 months                Told patient to return for periodic foot care and evaluation due to potential at risk complications.   Helane Gunther DPM

## 2022-10-07 ENCOUNTER — Telehealth: Payer: Self-pay

## 2022-10-07 NOTE — Telephone Encounter (Signed)
Ozempic sample picked up

## 2022-10-07 NOTE — Telephone Encounter (Signed)
Ozempic 0.25/0.5mg  EXP : 04/03/24 LOT: [id number] Quantity: 1 box

## 2022-10-14 ENCOUNTER — Telehealth: Payer: Self-pay

## 2022-10-14 NOTE — Telephone Encounter (Signed)
Patient spouse advised that [employer] patient assistance is ready for pick up (Ozempic).

## 2022-10-18 NOTE — Telephone Encounter (Signed)
Patient's wife, Miranda Garber, came in to office today and picked up 5 boxes of patient assistance Ozempic.

## 2022-11-21 ENCOUNTER — Other Ambulatory Visit (HOSPITAL_COMMUNITY): Payer: Self-pay

## 2022-12-09 ENCOUNTER — Other Ambulatory Visit: Payer: Self-pay | Admitting: Family Medicine

## 2022-12-27 DIAGNOSIS — H353231 Exudative age-related macular degeneration, bilateral, with active choroidal neovascularization: Secondary | ICD-10-CM | POA: Diagnosis not present

## 2022-12-27 DIAGNOSIS — H43813 Vitreous degeneration, bilateral: Secondary | ICD-10-CM | POA: Diagnosis not present

## 2023-01-12 ENCOUNTER — Telehealth: Payer: Self-pay

## 2023-01-12 NOTE — Telephone Encounter (Signed)
Patient spouse advised that Ozempic patient assistance is ready for pick up  5 boxes

## 2023-01-24 ENCOUNTER — Encounter: Payer: Self-pay | Admitting: Internal Medicine

## 2023-01-24 ENCOUNTER — Ambulatory Visit: Payer: PPO | Admitting: Internal Medicine

## 2023-01-24 VITALS — BP 110/76 | HR 88 | Ht 69.0 in | Wt 178.0 lb

## 2023-01-24 DIAGNOSIS — E1122 Type 2 diabetes mellitus with diabetic chronic kidney disease: Secondary | ICD-10-CM | POA: Diagnosis not present

## 2023-01-24 DIAGNOSIS — Z7984 Long term (current) use of oral hypoglycemic drugs: Secondary | ICD-10-CM | POA: Diagnosis not present

## 2023-01-24 DIAGNOSIS — N1831 Chronic kidney disease, stage 3a: Secondary | ICD-10-CM | POA: Diagnosis not present

## 2023-01-24 DIAGNOSIS — E1165 Type 2 diabetes mellitus with hyperglycemia: Secondary | ICD-10-CM | POA: Diagnosis not present

## 2023-01-24 DIAGNOSIS — E1159 Type 2 diabetes mellitus with other circulatory complications: Secondary | ICD-10-CM

## 2023-01-24 LAB — POCT GLYCOSYLATED HEMOGLOBIN (HGB A1C): Hemoglobin A1C: 8 % — AB (ref 4.0–5.6)

## 2023-01-24 MED ORDER — GLIPIZIDE 10 MG PO TABS: 10.0000 mg | ORAL_TABLET | Freq: Two times a day (BID) | ORAL | 3 refills | Status: DC

## 2023-01-24 NOTE — Patient Instructions (Addendum)
-   STOP Ozempic - Increase  Glipizide 10 mg  ONE tablet before Breakfast and ONE tablet before Supper     HOW TO TREAT LOW BLOOD SUGARS (Blood sugar LESS THAN 70 MG/DL) Please follow the RULE OF 15 for the treatment of hypoglycemia treatment (when your (blood sugars are less than 70 mg/dL)   STEP 1: Take 15 grams of carbohydrates when your blood sugar is low, which includes:  3-4 GLUCOSE TABS  OR 3-4 OZ OF JUICE OR REGULAR SODA OR ONE TUBE OF GLUCOSE GEL    STEP 2: RECHECK blood sugar in 15 MINUTES STEP 3: If your blood sugar is still low at the 15 minute recheck --> then, go back to STEP 1 and treat AGAIN with another 15 grams of carbohydrates.

## 2023-01-24 NOTE — Progress Notes (Signed)
Name: [name]  MRN/ DOB: [id number], 05-May-1930   Age/ Sex: 89 y.o., male    PCP: Hannah Beat, MD   Reason for Endocrinology Evaluation: Type 2 Diabetes Mellitus     Date of Initial Endocrinology Visit: 09/29/2021    PATIENT IDENTIFIER: [name] is a 89 y.o. male with a past medical history of DM, CKD III, CVA and dementia, Hx of prostate cancer. The patient presented for initial endocrinology clinic visit on 09/29/2021 for consultative assistance with his diabetes management.    HPI: [name]   Diagnosed with DM 2009 Prior Medications tried/Intolerance: Metformin- severe diarrhea. Ozempic started 09/2021 Hemoglobin A1c has ranged from 6.8% in 2019, peaking at 8.3% in 2022.    On his initial visit to our clinic his A1c was 7.2% he was on glipizide and was just recently started on Ozempic.  We continued the Ozempic but decrease the glipizide preemptively for hypoglycemia  SUBJECTIVE:   During the last visit (01/14/2022): A1c 6.4%  Today (01/24/23): [name] is here for a follow up on diabetes management.  He is accompanied by his son today.  He checks his blood sugars multiple times daily. The patient has not had hypoglycemic episodes since the last clinic visit.   She has noted abdominal pain and diarrhea over the past 2 weeks Has noted nausea  but no vomiting  Denies fever  He has a care taker that manages his pill box     HOME DIABETES REGIMEN: Glipizide 10 mg  Ozempic 0.5  mg weekly  (Thursdays)     CONTINUOUS GLUCOSE MONITORING RECORD INTERPRETATION    Dates of Recording: 7/25-10/22/2024  Sensor description:freestyle  Results statistics:   CGM use % of time 89  Average and SD 199/25.8  Time in range 37%  % Time Above 180 46  % Time above 250 17  % Time Below target 0       Glycemic patterns summary: BG's trend down overnight and increase during the day Hyperglycemic episodes  postprandial   Hypoglycemic episodes  occurred N/A  Overnight periods: Variable      DIABETIC COMPLICATIONS: Microvascular complications:  CKD III, macular degeneration  Denies: retinopathy, neuropathy  Last eye exam: Completed 2023  Macrovascular complications:  CVA, CAD Denies: CAD, PVD   PAST HISTORY: Past Medical History:  Past Medical History:  Diagnosis Date   Adenocarcinoma of left lung, stage 1 (HCC) 2001   T1N0 stage I  adenoca left lung resected 01/03/00    Alzheimer's disease (HCC)    CAD (coronary artery disease) 2014   a. 09/2012 Cath: LM nl, LAD 50-60p, D1 60-70 m, D1 50ost, LCX nl, RCA min irregs, EF 55-65%.   Generalized anxiety disorder 01/08/2007   Qualifier: Diagnosis of  By: Ermalene Searing MD, [name]     History of colonic polyps 07/25/2011   Macular degeneration of both eyes 01/21/2014   Nonmelanoma skin cancer 07/25/2011   Multiple lesions excised face/nose   Pericarditis    a. 09/2012 with effusion and tamponade, s/p window.  b.  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 SCC IN SITU TX CX3   SCC (squamous cell carcinoma) 04/12/2005   RIGHT NECK SUP. SCC IN SITU TX EXC   SCC (squamous cell carcinoma) 04/16/2013   LEFT TEMPLE SCC IN SITU TX CX3 5FU   SCC (squamous cell carcinoma) 04/16/2013   BELOW LEFT EYE SCC IN SITU TX CX3 5FU   SCC (squamous cell carcinoma) 04/16/2013   BELOW RIGHT  EYE SCC IN SITU TX CX3 5FU   SCC (squamous cell carcinoma) 08/04/2014   LEFT CHEEK SCC IN SITU TX CX3 5FU   SCC (squamous cell carcinoma) 11/27/2018   LEFT TEMPLE SCC IN SITU TX WITH BX   SCC (squamous cell carcinoma)    SCC (squamous cell carcinoma)    SCC (squamous cell carcinoma)    SCC (squamous cell carcinoma) Well Diff 09/13/2016   Tip of Nose SCC WELL DIFF TX (MOH's), and RIGHT INNER EYE SUP. SCC IN SITU TX TO WATCH   SCC (squamous cell carcinoma) Well Diff 05/30/2017   Right Cheekbone (Cx3,5FU) and Under Left Eye (Cx3,5FU)   Squamous cell carcinoma in situ (SCCIS) 04/12/2005   Right Neck Inf (Cx3), Right Neck Sup (Exc), and Bulb of Nose (Cx3,Exc)   Squamous cell carcinoma in situ (SCCIS) 09/27/2005   Nose (Cx3,Exc)   Squamous cell carcinoma in situ (SCCIS) 02/18/2013   Below Left Eye (tx p bx)   Squamous cell carcinoma in situ (SCCIS) 04/16/2013   Left Temple (Cx3,5FU), Below Left Eye (Cx3,5FU), Below Right Eye (Cx3,5FU)   Squamous cell carcinoma in situ (SCCIS) 08/04/2014   Left Cheek (Cx3,5FU)   Squamous cell carcinoma in situ (SCCIS) 09/13/2016   Right Inner Eye Sup. 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   Type 2 diabetes mellitus with vascular disease (HCC) 05/21/2007   Qualifier: Diagnosis of  By: Ermalene Searing MD, [name]     Type 2 DM with CKD stage 3 and hypertension (HCC) 07/16/2015   Unspecified essential hypertension    Past Surgical History:  Past Surgical History:  Procedure Laterality Date   HERNIA REPAIR     LEFT HEART CATHETERIZATION WITH CORONARY ANGIOGRAM N/A 09/13/2012   Procedure: LEFT HEART CATHETERIZATION WITH CORONARY ANGIOGRAM;  Surgeon: Tonny Bollman, MD;  Location: [facility]  CATH LAB;  Service: Cardiovascular;  Laterality: N/A;   LOBECTOMY  2001   upper left   PERICARDIAL TAP N/A 09/16/2012   Procedure: PERICARDIAL TAP;  Surgeon: Lesleigh Noe, MD;  Location: [facility] CATH LAB;  Service: Cardiovascular;  Laterality: N/A;   PROSTATECTOMY  2003   SUBXYPHOID PERICARDIAL WINDOW N/A 09/16/2012   Procedure: SUBXYPHOID PERICARDIAL WINDOW;  Surgeon: Purcell Nails, MD;  Location: [facility] OR;  Service: Thoracic;  Laterality: N/A;   TONSILLECTOMY      Social History:  reports that he quit smoking about 38 years ago.  His smoking use included cigarettes. He started smoking about 69 years ago. He has a 84 pack-year smoking history. He quit smokeless tobacco use about 36 years ago.  His smokeless tobacco use included chew. He reports that he does not drink alcohol and does not use drugs. Family History:  Family History  Problem Relation Age of Onset   Cancer Father        colon   Dementia Mother    Diabetes Mother    Cancer Brother        lung     HOME MEDICATIONS: Allergies as of 01/24/2023       Reactions   Sulfa Antibiotics Nausea Only        Medication List        Accurate as of January 24, 2023  1:31 PM. If you have any questions, ask your nurse or doctor.          atorvastatin 20 MG tablet Commonly known as: LIPITOR TAKE 1 TABLET BY MOUTH EVERY DAY   donepezil 10 MG tablet Commonly known as: ARICEPT TAKE 1 TABLET BY MOUTH EVERYDAY AT BEDTIME   ferrous sulfate 325 (65 FE) MG tablet TAKE 1 TABLET BY MOUTH EVERY DAY WITH BREAKFAST   FreeStyle Libre 2 Reader Devi Use to check blood sugar continous   FreeStyle Libre 2 Sensor Misc USE TO CHECK BLOOD SUGAR CONTINUOUS   glipiZIDE 10 MG tablet Commonly known as: GLUCOTROL Take 1 tablet (10 mg total) by mouth daily before breakfast.   ICaps Lutein & Zeaxanthin Tbec Take 1 tablet by mouth 2 (two) times daily.   Ozempic (0.25 or 0.5 MG/DOSE) 2 MG/3ML Sopn Generic drug: Semaglutide(0.25 or  0.5MG /DOS) 0.5 mg by Other route once a week.   pantoprazole 40 MG tablet Commonly known as: PROTONIX TAKE 1 TABLET BY MOUTH EVERY DAY   sertraline 100 MG tablet Commonly known as: ZOLOFT TAKE 1 TABLET BY MOUTH EVERY DAY   Systane Balance 0.6 % Soln Generic drug: Propylene Glycol Apply 1 drop to eye 2 (two) times daily as needed (dry eyes).   tamsulosin 0.4 MG Caps capsule Commonly known as: FLOMAX TAKE 1 CAPSULE BY MOUTH EVERY DAY AFTER SUPPER   triamcinolone cream 0.1 % Commonly known as: KENALOG Apply 1 application topically 2 (two) times daily.         ALLERGIES: Allergies  Allergen Reactions   Sulfa Antibiotics Nausea Only     REVIEW OF SYSTEMS: A comprehensive ROS was conducted with the patient and is negative except as per HPI    OBJECTIVE:   VITAL SIGNS: BP 110/76 (BP Location: Left Arm, Patient Position: Sitting, Cuff Size: Small)   Pulse 88   Ht 5\' 9"  (1.753 m)   Wt 178 lb (80.7 kg)   SpO2 96%   BMI 26.29 kg/m    PHYSICAL EXAM:  General: Pt appears well and is in NAD   Lungs: Clear with good BS bilat   Heart: RRR   Extremities:  No  pretibial edema  Neuro: MS is good with appropriate affect, pt is alert and Ox3    Dm Foot Exam 07/18/2022  The skin of the feet is without sores or ulcerations, nails are discolored and thickened The pedal pulses are 1+ on right and 1+ on left. The sensation is decreased  to a screening 5.07, 10 gram monofilament bilaterally  DATA REVIEWED:  Lab Results  Component Value Date   HGBA1C 8.0 (A) 01/24/2023   HGBA1C 7.1 (H) 09/22/2022  HGBA1C 6.6 (A) 07/18/2022    Latest Reference Range & Units 09/22/22 08:58  Sodium 135 - 145 mEq/L 139  Potassium 3.5 - 5.1 mEq/L 4.6  Chloride 96 - 112 mEq/L 104  CO2 19 - 32 mEq/L 26  Glucose 70 - 99 mg/dL 643 (H)  BUN 6 - 23 mg/dL 24 (H)  Creatinine 3.29 - 1.50 mg/dL 5.18 (H)  Calcium 8.4 - 10.5 mg/dL 9.2  Alkaline Phosphatase 39 - 117 U/L 93  Albumin 3.5 - 5.2 g/dL  4.2  AST 0 - 37 U/L 15  ALT 0 - 53 U/L 16  Total Protein 6.0 - 8.3 g/dL 7.1  Bilirubin, Direct 0.0 - 0.3 mg/dL 0.2  Total Bilirubin 0.2 - 1.2 mg/dL 0.6  GFR >84.16 mL/min 33.99 (L)    Latest Reference Range & Units 09/22/22 08:58  Total CHOL/HDL Ratio  3  Cholesterol 0 - 200 mg/dL 606  HDL Cholesterol >30.16 mg/dL 01.09 (L)  Direct LDL mg/dL 32.3  MICROALB/CREAT RATIO 0.0 - 30.0 mg/g 6.0  NonHDL  74.21  Triglycerides 0.0 - 149.0 mg/dL 557.3 (H)  VLDL 0.0 - 22.0 mg/dL 25.4 (H)  (L): Data is abnormally low (H): Data is abnormally high  ASSESSMENT / PLAN / RECOMMENDATIONS:   1) Type 2 Diabetes Mellitus, Poorly controlled, With CKD III and macrovascular  complications - Most recent A1c of 8.0 %. Goal A1c < 8.0 %.      -His A1c has trended up from 6.6% to 8.0% -I am concerned about forgetfulness with taking medications on a regular basis, son states that the patient has a caregiver who manages his medications, the patient did endorse forgetfulness and taking his medications -He has been noted with GI issues over the past 2 weeks that he attributes to Ozempic, he also has been noted with decreased appetite  -Patient is intolerant to metformin, as well as low GFR -I have recommended discontinuing Ozempic at this time -Will increase glipizide as below, emphasized the importance of taking this 15-20 minutes before the first and last meal of the day -Patient was encouraged to contact our office with hypoglycemia to make the appropriate adjustments   MEDICATIONS: Increase glipizide 10 mg BID  Stop Ozempic 0.5 mg weekly  EDUCATION / INSTRUCTIONS: BG monitoring instructions: Patient is instructed to check his blood sugars 3 times a day, before meals. Call [facility] if: BG persistently < 70  I reviewed the Rule of 15 for the treatment of hypoglycemia in detail with the patient. Literature supplied.   2) Diabetic complications:  Eye: Does not have known diabetic  retinopathy.  Neuro/ Feet: Does not have known diabetic peripheral neuropathy. Renal: Patient does  have known baseline CKD. He is not on an ACEI/ARB at present.      Follow-up in 3 months     Signed electronically by: Lyndle Herrlich, MD  [facility]  [facility] [address] [place], [place] [address] Phone: [phone] FAX: [phone]   CC: Hannah Beat, MD [address] [place] [address] Phone: [phone]  Fax: [phone]    Return to Endocrinology clinic as below: Future Appointments  Date Time Provider Department Center  04/03/2023  2:40 PM Copland, Karleen Hampshire, MD [id number]

## 2023-01-25 ENCOUNTER — Other Ambulatory Visit: Payer: Self-pay | Admitting: Family Medicine

## 2023-03-24 ENCOUNTER — Other Ambulatory Visit: Payer: Self-pay | Admitting: Family Medicine

## 2023-04-02 NOTE — Progress Notes (Unsigned)
Ethan Giovanetti T. Ilsa Bonello, MD, CAQ Sports Medicine [facility] at [facility] [address] [place], [address]  Phone: [phone]  FAX: [phone]  [name] - 89 y.o. male  MRN [id number]  Date of Birth: 05-22-1930  Date: 04/03/2023  PCP: Hannah Beat, MD  Referral: Hannah Beat, MD  Chief Complaint  Patient presents with   Medical Management of Chronic Issues    6 month follow up   Subjective:   [name] is a 89 y.o. very pleasant male patient with Body mass index is 26.62 kg/m. who presents with the following:  [name] is here to follow-up on chronic medical problems.  He has a history of right pontine stroke with some persistent weakness after stroke with incomplete recovery.  He also has coronary disease, diabetes, history of prostate cancer as well as a remote history of adenocarcinoma of the lung.  He also has some mild dementia. Hypertension, hyperlipidemia, history of chronic kidney disease stage III. He also has had many different skin cancers going for many years back.  Shingrix Flu - will get this morning Covid booster  F/u labs CMP CBC  Health Maintenance  Topic Date Due   Zoster Vaccines- Shingrix (1 of 2) Never done   OPHTHALMOLOGY EXAM  04/23/2020   INFLUENZA VACCINE  11/03/2022   COVID-19 Vaccine (4 - 2024-25 season) 12/04/2022   DTaP/Tdap/Td (3 - Td or Tdap) 01/26/2023   FOOT EXAM  07/18/2023   HEMOGLOBIN A1C  07/25/2023   Medicare Annual Wellness (AWV)  09/29/2023   Pneumonia Vaccine 88+ Years old  Completed   HPV VACCINES  Aged Out    Immunization History  Administered Date(s) Administered   Fluad Quad(high Dose 65+) 12/26/2019, 03/13/2021, 01/14/2022   Influenza Whole 01/03/2007   Influenza, High Dose Seasonal PF 01/19/2018, 03/06/2019   Influenza,inj,Quad PF,6+ Mos 02/06/2013, 01/20/2014, 01/15/2015, 01/29/2016   Influenza-Unspecified 01/02/2017   Moderna Sars-Covid-2 Vaccination  06/03/2019, 07/04/2019, 03/19/2020   Pneumococcal Conjugate-13 01/20/2014   Pneumococcal Polysaccharide-23 01/02/2001   Td 04/04/1994   Tdap 01/25/2013     HTN: Tolerating all medications without side effects Stable and at goal No CP, no sob. No HA.  BP Readings from Last 3 Encounters:  04/03/23 110/60  01/24/23 110/76  09/29/22 138/68    Basic Metabolic Panel:    Component Value Date/Time   NA 139 09/22/2022 0858   NA 143 07/30/2015 0957   K 4.6 09/22/2022 0858   K 5.3 No visable hemolysis (H) 07/30/2015 0957   CL 104 09/22/2022 0858   CL 103 07/20/2012 1046   CO2 26 09/22/2022 0858   CO2 28 07/30/2015 0957   BUN 24 (H) 09/22/2022 0858   BUN 14.4 07/30/2015 0957   CREATININE 1.75 (H) 09/22/2022 0858   CREATININE 1.5 (H) 07/30/2015 0957   GLUCOSE 205 (H) 09/22/2022 0858   GLUCOSE 231 (H) 07/30/2015 0957   GLUCOSE 215 (H) 07/20/2012 1046   CALCIUM 9.2 09/22/2022 0858   CALCIUM 9.5 07/30/2015 0957    Lipids: Doing well, stable. Tolerating meds fine with no SE. Panel reviewed with patient.  Lipids: Lab Results  Component Value Date   CHOL 107 09/22/2022   Lab Results  Component Value Date   HDL 32.30 (L) 09/22/2022   Lab Results  Component Value Date   LDLCALC 83 03/08/2021   Lab Results  Component Value Date   TRIG 208.0 (H) 09/22/2022   Lab Results  Component Value Date   CHOLHDL 3  09/22/2022    Lab Results  Component Value Date   ALT 16 09/22/2022   AST 15 09/22/2022   ALKPHOS 93 09/22/2022   BILITOT 0.6 09/22/2022    Dr. Lonzo Cloud d/c the Ozempic and increased Glipizide  Diabetes Mellitus: Tolerating Medications: yes Compliance with diet: fair, Body mass index is 26.62 kg/m. Exercise: minimal / intermittent Avg blood sugars at home: not checking Foot problems: none Hypoglycemia: none No nausea, vomitting, blurred vision, polyuria.  Lab Results  Component Value Date   HGBA1C 8.0 (A) 01/24/2023   HGBA1C 7.1 (H) 09/22/2022   HGBA1C  6.6 (A) 07/18/2022   Lab Results  Component Value Date   MICROALBUR 21.0 (H) 09/22/2022   LDLCALC 83 03/08/2021   CREATININE 1.75 (H) 09/22/2022    Wt Readings from Last 3 Encounters:  04/03/23 180 lb 4 oz (81.8 kg)  01/24/23 178 lb (80.7 kg)  09/29/22 184 lb 4 oz (83.6 kg)     Review of Systems is noted in the HPI, as appropriate  Objective:   BP 110/60 (BP Location: Left Arm, Patient Position: Sitting, Cuff Size: Large)   Pulse 78   Temp 97.9 F (36.6 C) (Temporal)   Ht 5\' 9"  (1.753 m)   Wt 180 lb 4 oz (81.8 kg)   SpO2 98%   BMI 26.62 kg/m   GEN: No acute distress; alert,appropriate. PULM: Breathing comfortably in no respiratory distress PSYCH: Normally interactive.   Laboratory and Imaging Data:  Assessment and Plan:   ***

## 2023-04-03 ENCOUNTER — Ambulatory Visit (INDEPENDENT_AMBULATORY_CARE_PROVIDER_SITE_OTHER): Payer: PPO | Admitting: Family Medicine

## 2023-04-03 ENCOUNTER — Encounter: Payer: Self-pay | Admitting: Family Medicine

## 2023-04-03 VITALS — BP 110/60 | HR 78 | Temp 97.9°F | Ht 69.0 in | Wt 180.2 lb

## 2023-04-03 DIAGNOSIS — N1831 Chronic kidney disease, stage 3a: Secondary | ICD-10-CM | POA: Diagnosis not present

## 2023-04-03 DIAGNOSIS — Z79899 Other long term (current) drug therapy: Secondary | ICD-10-CM

## 2023-04-03 DIAGNOSIS — E7800 Pure hypercholesterolemia, unspecified: Secondary | ICD-10-CM

## 2023-04-03 DIAGNOSIS — Z23 Encounter for immunization: Secondary | ICD-10-CM | POA: Diagnosis not present

## 2023-04-03 DIAGNOSIS — I6350 Cerebral infarction due to unspecified occlusion or stenosis of unspecified cerebral artery: Secondary | ICD-10-CM

## 2023-04-03 DIAGNOSIS — I10 Essential (primary) hypertension: Secondary | ICD-10-CM

## 2023-04-03 DIAGNOSIS — C61 Malignant neoplasm of prostate: Secondary | ICD-10-CM | POA: Diagnosis not present

## 2023-04-03 NOTE — Patient Instructions (Signed)
Get your Covid vaccine  RSV vaccine

## 2023-04-04 DIAGNOSIS — H43813 Vitreous degeneration, bilateral: Secondary | ICD-10-CM | POA: Diagnosis not present

## 2023-04-04 DIAGNOSIS — H353231 Exudative age-related macular degeneration, bilateral, with active choroidal neovascularization: Secondary | ICD-10-CM | POA: Diagnosis not present

## 2023-04-04 LAB — CBC WITH DIFFERENTIAL/PLATELET
Basophils Absolute: 0 10*3/uL (ref 0.0–0.1)
Basophils Relative: 0.2 % (ref 0.0–3.0)
Eosinophils Absolute: 0 10*3/uL (ref 0.0–0.7)
Eosinophils Relative: 0.5 % (ref 0.0–5.0)
HCT: 37.3 % — ABNORMAL LOW (ref 39.0–52.0)
Hemoglobin: 12.6 g/dL — ABNORMAL LOW (ref 13.0–17.0)
Lymphocytes Relative: 5.8 % — ABNORMAL LOW (ref 12.0–46.0)
Lymphs Abs: 0.4 10*3/uL — ABNORMAL LOW (ref 0.7–4.0)
MCHC: 33.9 g/dL (ref 30.0–36.0)
MCV: 98 fL (ref 78.0–100.0)
Monocytes Absolute: 1.8 10*3/uL — ABNORMAL HIGH (ref 0.1–1.0)
Monocytes Relative: 23.2 % — ABNORMAL HIGH (ref 3.0–12.0)
Neutro Abs: 5.3 10*3/uL (ref 1.4–7.7)
Neutrophils Relative %: 70.3 % (ref 43.0–77.0)
Platelets: 124 10*3/uL — ABNORMAL LOW (ref 150.0–400.0)
RBC: 3.81 Mil/uL — ABNORMAL LOW (ref 4.22–5.81)
RDW: 13.3 % (ref 11.5–15.5)
WBC: 7.6 10*3/uL (ref 4.0–10.5)

## 2023-04-04 LAB — HEPATIC FUNCTION PANEL
ALT: 12 U/L (ref 0–53)
AST: 13 U/L (ref 0–37)
Albumin: 4.1 g/dL (ref 3.5–5.2)
Alkaline Phosphatase: 103 U/L (ref 39–117)
Bilirubin, Direct: 0.1 mg/dL (ref 0.0–0.3)
Total Bilirubin: 0.4 mg/dL (ref 0.2–1.2)
Total Protein: 6.6 g/dL (ref 6.0–8.3)

## 2023-04-04 LAB — BASIC METABOLIC PANEL
BUN: 20 mg/dL (ref 6–23)
CO2: 27 meq/L (ref 19–32)
Calcium: 8.7 mg/dL (ref 8.4–10.5)
Chloride: 101 meq/L (ref 96–112)
Creatinine, Ser: 1.69 mg/dL — ABNORMAL HIGH (ref 0.40–1.50)
GFR: 35.31 mL/min — ABNORMAL LOW (ref >60.00)
Glucose, Bld: 233 mg/dL — ABNORMAL HIGH (ref 70–99)
Potassium: 4.8 meq/L (ref 3.5–5.1)
Sodium: 137 meq/L (ref 135–145)

## 2023-04-10 NOTE — Telephone Encounter (Signed)
 Patient picked up patient assistance - Log Noted  Ozempic

## 2023-04-20 ENCOUNTER — Telehealth: Payer: Self-pay

## 2023-04-20 NOTE — Telephone Encounter (Signed)
LTDVM that [employer] patient assistance is ready for pick up Ozempic 5 boxes

## 2023-05-19 ENCOUNTER — Encounter: Payer: Self-pay | Admitting: Internal Medicine

## 2023-05-19 ENCOUNTER — Ambulatory Visit: Payer: PPO | Admitting: Internal Medicine

## 2023-05-19 VITALS — BP 138/76 | HR 94 | Ht 69.0 in | Wt 177.0 lb

## 2023-05-19 DIAGNOSIS — E1122 Type 2 diabetes mellitus with diabetic chronic kidney disease: Secondary | ICD-10-CM

## 2023-05-19 DIAGNOSIS — R0989 Other specified symptoms and signs involving the circulatory and respiratory systems: Secondary | ICD-10-CM | POA: Insufficient documentation

## 2023-05-19 DIAGNOSIS — N1831 Chronic kidney disease, stage 3a: Secondary | ICD-10-CM | POA: Diagnosis not present

## 2023-05-19 DIAGNOSIS — E1159 Type 2 diabetes mellitus with other circulatory complications: Secondary | ICD-10-CM

## 2023-05-19 DIAGNOSIS — Z7984 Long term (current) use of oral hypoglycemic drugs: Secondary | ICD-10-CM

## 2023-05-19 DIAGNOSIS — Z7985 Long-term (current) use of injectable non-insulin antidiabetic drugs: Secondary | ICD-10-CM | POA: Diagnosis not present

## 2023-05-19 LAB — POCT GLYCOSYLATED HEMOGLOBIN (HGB A1C): Hemoglobin A1C: 6.9 % — AB (ref 4.0–5.6)

## 2023-05-19 NOTE — Patient Instructions (Signed)
-   Continue Glipizide 10 mg  ONE tablet before Breakfast and ONE tablet before Supper  -Continue Ozempic 0.25 mg weekly    HOW TO TREAT LOW BLOOD SUGARS (Blood sugar LESS THAN 70 MG/DL) Please follow the RULE OF 15 for the treatment of hypoglycemia treatment (when your (blood sugars are less than 70 mg/dL)   STEP 1: Take 15 grams of carbohydrates when your blood sugar is low, which includes:  3-4 GLUCOSE TABS  OR 3-4 OZ OF JUICE OR REGULAR SODA OR ONE TUBE OF GLUCOSE GEL    STEP 2: RECHECK blood sugar in 15 MINUTES STEP 3: If your blood sugar is still low at the 15 minute recheck --> then, go back to STEP 1 and treat AGAIN with another 15 grams of carbohydrates.

## 2023-05-19 NOTE — Progress Notes (Signed)
 Name: [name]  MRN/ DOB: [id number], 03-18-1931   Age/ Sex: 89 y.o., male    PCP: Hannah Beat, MD   Reason for Endocrinology Evaluation: Type 2 Diabetes Mellitus     Date of Initial Endocrinology Visit: 09/29/2021    PATIENT IDENTIFIER: Mr. TREVIAN HAYASHIDA is a 89 y.o. male with a past medical history of DM, CKD III, CVA and dementia, Hx of prostate cancer. The patient presented for initial endocrinology clinic visit on 09/29/2021 for consultative assistance with his diabetes management.    HPI: Mr. Goto   Diagnosed with DM 2009 Prior Medications tried/Intolerance: Metformin- severe diarrhea. Ozempic started 09/2021 Hemoglobin A1c has ranged from 6.8% in 2019, peaking at 8.3% in 2022.    On his initial visit to our clinic his A1c was 7.2% he was on glipizide and was just recently started on Ozempic.  We continued the Ozempic but decrease the glipizide preemptively for hypoglycemia   We discontinued Ozempic 0.5 mg weekly 01/2023 due to GI side effects and decreased appetite but the patient restarted at 0.25 mg without any side effects by 05/2023  SUBJECTIVE:   During the last visit (01/24/2023): A1c 8.0%    Today (05/19/23): Mr. Gilpatrick is here for a follow up on diabetes management. He checks his blood sugars multiple times daily. The patient has not had hypoglycemic episodes since the last clinic visit.   He is accompanied by his wife and caregiver Weight has been stable  Denies recent nausea or vomiting  Denies constipation or diarrhea      HOME DIABETES REGIMEN: Glipizide 10 mg BID  Ozempic 0.25 mg weekly      CONTINUOUS GLUCOSE MONITORING RECORD INTERPRETATION    Dates of Recording: 2/1-2/14/2025 Sensor description:freestyle  Results statistics:   CGM use % of time 82  Average and SD 186/32.3  Time in range 51%  % Time Above 180 34  % Time above 250 15  % Time Below target 0       Glycemic patterns summary: BGs are optimal  overnight and fluctuate during the day Hyperglycemic episodes  postprandial   Hypoglycemic episodes occurred N/A  Overnight periods: Optimal      DIABETIC COMPLICATIONS: Microvascular complications:  CKD III, macular degeneration  Denies: retinopathy, neuropathy  Last eye exam: Completed 2023  Macrovascular complications:  CVA, CAD Denies: CAD, PVD   PAST HISTORY: Past Medical History:  Past Medical History:  Diagnosis Date   Adenocarcinoma of left lung, stage 1 (HCC) 2001   T1N0 stage I  adenoca left lung resected 01/03/00    Alzheimer's disease (HCC)    CAD (coronary artery disease) 2014   a. 09/2012 Cath: LM nl, LAD 50-60p, D1 60-70 m, D1 50ost, LCX nl, RCA min irregs, EF 55-65%.   Generalized anxiety disorder 01/08/2007   Qualifier: Diagnosis of  By: Ermalene Searing MD, [name]     History of colonic polyps 07/25/2011   Macular degeneration of both eyes 01/21/2014   Nonmelanoma skin cancer 07/25/2011   Multiple lesions excised face/nose   Pericarditis    a. 09/2012 with effusion and tamponade, s/p window.  b.  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 SCC IN SITU TX CX3   SCC (squamous cell carcinoma) 04/12/2005   RIGHT NECK SUP. SCC IN SITU TX EXC   SCC (squamous cell carcinoma) 04/16/2013   LEFT TEMPLE SCC IN SITU TX CX3 5FU   SCC (squamous cell carcinoma)  04/16/2013   BELOW LEFT EYE SCC IN SITU TX CX3 5FU   SCC (squamous cell carcinoma) 04/16/2013   BELOW RIGHT EYE SCC IN SITU TX CX3 5FU   SCC (squamous cell carcinoma) 08/04/2014   LEFT CHEEK SCC IN SITU TX CX3 5FU   SCC (squamous cell carcinoma) 11/27/2018   LEFT TEMPLE SCC IN SITU TX WITH BX   SCC (squamous cell carcinoma)    SCC (squamous cell carcinoma)    SCC (squamous cell carcinoma)    SCC (squamous cell carcinoma) Well Diff 09/13/2016   Tip of Nose SCC WELL DIFF TX (MOH's), and RIGHT INNER EYE SUP. SCC IN SITU TX TO WATCH   SCC (squamous cell carcinoma) Well Diff 05/30/2017   Right Cheekbone (Cx3,5FU) and Under Left Eye (Cx3,5FU)   Squamous cell carcinoma in situ (SCCIS) 04/12/2005   Right Neck Inf (Cx3), Right Neck Sup (Exc), and Bulb of Nose (Cx3,Exc)   Squamous cell carcinoma in situ (SCCIS) 09/27/2005   Nose (Cx3,Exc)   Squamous cell carcinoma in situ (SCCIS) 02/18/2013   Below Left Eye (tx p bx)   Squamous cell carcinoma in situ (SCCIS) 04/16/2013   Left Temple (Cx3,5FU), Below Left Eye (Cx3,5FU), Below Right Eye (Cx3,5FU)   Squamous cell carcinoma in situ (SCCIS) 08/04/2014   Left Cheek (Cx3,5FU)   Squamous cell carcinoma in situ (SCCIS) 09/13/2016   Right Inner Eye Sup. 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   Type 2 diabetes mellitus with vascular disease (HCC) 05/21/2007   Qualifier: Diagnosis of  By: Ermalene Searing MD, [name]     Type 2 DM with CKD stage 3 and hypertension (HCC) 07/16/2015   Unspecified essential hypertension    Past Surgical History:  Past Surgical History:  Procedure Laterality Date   HERNIA REPAIR     LEFT HEART CATHETERIZATION WITH CORONARY ANGIOGRAM N/A  09/13/2012   Procedure: LEFT HEART CATHETERIZATION WITH CORONARY ANGIOGRAM;  Surgeon: Tonny Bollman, MD;  Location: [facility] CATH LAB;  Service: Cardiovascular;  Laterality: N/A;   LOBECTOMY  2001   upper left   PERICARDIAL TAP N/A 09/16/2012   Procedure: PERICARDIAL TAP;  Surgeon: Lesleigh Noe, MD;  Location: [facility] CATH LAB;  Service: Cardiovascular;  Laterality: N/A;   PROSTATECTOMY  2003   SUBXYPHOID PERICARDIAL WINDOW N/A 09/16/2012   Procedure: SUBXYPHOID PERICARDIAL WINDOW;  Surgeon: Purcell Nails, MD;  Location: [facility] OR;  Service: Thoracic;  Laterality: N/A;   TONSILLECTOMY  Social History:  reports that he quit smoking about 39 years ago. His smoking use included cigarettes. He started smoking about 70 years ago. He has a 84 pack-year smoking history. He quit smokeless tobacco use about 36 years ago.  His smokeless tobacco use included chew. He reports that he does not drink alcohol and does not use drugs. Family History:  Family History  Problem Relation Age of Onset   Cancer Father        colon   Dementia Mother    Diabetes Mother    Cancer Brother        lung     HOME MEDICATIONS: Allergies as of 05/19/2023       Reactions   Sulfa Antibiotics Nausea Only        Medication List        Accurate as of May 19, 2023  1:06 PM. If you have any questions, ask your nurse or doctor.          atorvastatin 20 MG tablet Commonly known as: LIPITOR TAKE 1 TABLET BY MOUTH EVERY DAY   donepezil 10 MG tablet Commonly known as: ARICEPT TAKE 1 TABLET BY MOUTH EVERYDAY AT BEDTIME   ferrous sulfate 325 (65 FE) MG tablet TAKE 1 TABLET BY MOUTH EVERY DAY WITH BREAKFAST   FreeStyle Libre 2 Reader Devi Use to check blood sugar continous   FreeStyle Libre 2 Sensor Misc USE TO CHECK BLOOD SUGAR CONTINUOUS   glipiZIDE 10 MG tablet Commonly known as: GLUCOTROL Take 1 tablet (10 mg total) by mouth 2 (two) times daily before a meal.   ICaps Lutein & Zeaxanthin Tbec Take  1 tablet by mouth 2 (two) times daily.   Ozempic (0.25 or 0.5 MG/DOSE) 2 MG/3ML Sopn Generic drug: Semaglutide(0.25 or 0.5MG /DOS) Inject 0.25 mg into the skin once a week.   pantoprazole 40 MG tablet Commonly known as: PROTONIX TAKE 1 TABLET BY MOUTH EVERY DAY   sertraline 100 MG tablet Commonly known as: ZOLOFT Take 1 tablet (100 mg total) by mouth daily.   Systane Balance 0.6 % Soln Generic drug: Propylene Glycol Apply 1 drop to eye 2 (two) times daily as needed (dry eyes).   tamsulosin 0.4 MG Caps capsule Commonly known as: FLOMAX TAKE 1 CAPSULE BY MOUTH EVERY DAY AFTER SUPPER   triamcinolone cream 0.1 % Commonly known as: KENALOG Apply 1 application topically 2 (two) times daily.         ALLERGIES: Allergies  Allergen Reactions   Sulfa Antibiotics Nausea Only     REVIEW OF SYSTEMS: A comprehensive ROS was conducted with the patient and is negative except as per HPI    OBJECTIVE:   VITAL SIGNS: BP 138/76 (BP Location: Right Arm, Patient Position: Sitting, Cuff Size: Normal)   Pulse 94   Ht 5\' 9"  (1.753 m)   Wt 177 lb (80.3 kg)   SpO2 97%   BMI 26.14 kg/m    PHYSICAL EXAM:  General: Pt appears well and is in NAD   Lungs: Scattered rhonchi  Heart: RRR   Extremities:  No  pretibial edema  Neuro: MS is good with appropriate affect, pt is alert and Ox3    Dm Foot Exam 05/19/2023  The skin of the feet is without sores or ulcerations, nails are discolored and thickened The pedal pulses are undetectable  The sensation is decreased  to a screening 5.07, 10 gram monofilament bilaterally  DATA REVIEWED:  Lab Results  Component Value Date   HGBA1C 8.0 (  A) 01/24/2023   HGBA1C 7.1 (H) 09/22/2022   HGBA1C 6.6 (A) 07/18/2022    Latest Reference Range & Units 04/03/23 15:25  Sodium 135 - 145 mEq/L 137  Potassium 3.5 - 5.1 mEq/L 4.8  Chloride 96 - 112 mEq/L 101  CO2 19 - 32 mEq/L 27  Glucose 70 - 99 mg/dL 784 (H)  BUN 6 - 23 mg/dL 20  Creatinine 6.96  - 1.50 mg/dL 2.95 (H)  Calcium 8.4 - 10.5 mg/dL 8.7  Alkaline Phosphatase 39 - 117 U/L 103  Albumin 3.5 - 5.2 g/dL 4.1  AST 0 - 37 U/L 13  ALT 0 - 53 U/L 12  Total Protein 6.0 - 8.3 g/dL 6.6  Bilirubin, Direct 0.0 - 0.3 mg/dL 0.1  Total Bilirubin 0.2 - 1.2 mg/dL 0.4  GFR >28.41 mL/min 35.31 (L)    Latest Reference Range & Units 09/22/22 08:58  Creatinine,U mg/dL 324.4  Microalb, Ur 0.0 - 1.9 mg/dL 01.0 (H)  MICROALB/CREAT RATIO 0.0 - 30.0 mg/g 6.0    ASSESSMENT / PLAN / RECOMMENDATIONS:   1) Type 2 Diabetes Mellitus, Poorly controlled, With CKD III and macrovascular  complications - Most recent A1c of 8.0 %. Goal A1c < 8.0 %.      -His A1c has trended down from 8.0% to 6.9% -He has been able to tolerate Ozempic 0.25 mg dose -Patient has been noted with postprandial hyperglycemia, will continue current regimen  MEDICATIONS: Continue Glipizide 10 mg BID  Continue  Ozempic 0.25 mg weekly  EDUCATION / INSTRUCTIONS: BG monitoring instructions: Patient is instructed to check his blood sugars 3 times a day, before meals. Call [facility] if: BG persistently < 70  I reviewed the Rule of 15 for the treatment of hypoglycemia in detail with the patient. Literature supplied.   2) Diabetic complications:  Eye: Does not have known diabetic retinopathy.  Neuro/ Feet: Does not have known diabetic peripheral neuropathy. Renal: Patient does  have known baseline CKD. He is not on an ACEI/ARB at present.   3) Absent dorsalis pedis pulses   -Unable to palpate DP pulses at this time, patient has been noted with purplish discoloration of bilateral feet -He is complaining of weakness of the feet -I will proceed with ABI    Follow-up in 6 months   I spent 25 minutes preparing to see the patient by review of recent labs, imaging and procedures, obtaining and reviewing separately obtained history, communicating with the patient, ordering medications, tests or procedures,  and documenting clinical information in the EHR including the differential Dx, treatment, and any further evaluation and other management    Signed electronically by: Lyndle Herrlich, MD  [facility]  [facility] [address] [place] [place], [place] [address] Phone: [phone] FAX: [phone]   CC: Hannah Beat, MD [address] [place] [address] Phone: [phone]  Fax: [phone]    Return to Endocrinology clinic as below: No future appointments.

## 2023-06-01 ENCOUNTER — Other Ambulatory Visit: Payer: Self-pay | Admitting: Family Medicine

## 2023-06-01 DIAGNOSIS — E1159 Type 2 diabetes mellitus with other circulatory complications: Secondary | ICD-10-CM

## 2023-06-12 ENCOUNTER — Encounter: Payer: Self-pay | Admitting: Internal Medicine

## 2023-06-12 ENCOUNTER — Ambulatory Visit (HOSPITAL_COMMUNITY)
Admission: RE | Admit: 2023-06-12 | Discharge: 2023-06-12 | Disposition: A | Discharge status: Home / Self Care | Payer: PPO | Source: Ambulatory Visit | Attending: Internal Medicine | Admitting: Internal Medicine

## 2023-06-12 DIAGNOSIS — I739 Peripheral vascular disease, unspecified: Secondary | ICD-10-CM

## 2023-06-12 DIAGNOSIS — E1159 Type 2 diabetes mellitus with other circulatory complications: Secondary | ICD-10-CM | POA: Diagnosis present

## 2023-06-12 LAB — VAS US ABI WITH/WO TBI
Left ABI: 1.08
Right ABI: 1.18

## 2023-06-15 ENCOUNTER — Telehealth: Payer: Self-pay | Admitting: Family Medicine

## 2023-06-15 NOTE — Telephone Encounter (Signed)
 Left message for [name] to return call to office.

## 2023-06-15 NOTE — Telephone Encounter (Signed)
 Can you call Mr. or Mrs. [name].  JC got an email alert about the microalbumin/urine kidney functioning computer miscalculation.  For him, I do not think that this really makes any difference at all.  We have known for an extended period of time that he has some mild kidney impairment, and we have been tracking his kidney function for many years.  I really do not think there is anything to worry about.  We will continue to track his kidney function over time.

## 2023-06-16 NOTE — Telephone Encounter (Signed)
 Mrs. Parrillo notified as instructed by telephone.  States understanding.

## 2023-07-07 NOTE — Telephone Encounter (Signed)
 Patient picked up patient assistance - Log Noted  Ozempic

## 2023-07-14 ENCOUNTER — Ambulatory Visit: Admitting: Podiatry

## 2023-07-14 ENCOUNTER — Encounter: Payer: Self-pay | Admitting: Podiatry

## 2023-07-14 DIAGNOSIS — E1151 Type 2 diabetes mellitus with diabetic peripheral angiopathy without gangrene: Secondary | ICD-10-CM | POA: Diagnosis not present

## 2023-07-14 DIAGNOSIS — M79675 Pain in left toe(s): Secondary | ICD-10-CM

## 2023-07-14 DIAGNOSIS — M79674 Pain in right toe(s): Secondary | ICD-10-CM | POA: Diagnosis not present

## 2023-07-14 DIAGNOSIS — B351 Tinea unguium: Secondary | ICD-10-CM

## 2023-07-14 NOTE — Progress Notes (Signed)
This patient returns to my office for at risk foot care.  This patient requires this care by a professional since this patient will be at risk due to having diabetes with vascular disease.  Patient also has chronic kidney disease.       This patient is unable to cut nails himself since the patient cannot reach his nails.These nails are painful walking and wearing shoes.  This patient presents for at risk foot care today.  General Appearance  Alert, conversant and in no acute stress.  Vascular  Dorsalis pedis  pulses are weakly  palpable  bilaterally. Posterior tibial pulses are absent  B/L.   Capillary return is within normal limits  bilaterally. Cold feet   bilaterally.  Neurologic  Senn-Weinstein monofilament wire test within normal limits  bilaterally. Muscle power within normal limits bilaterally.  Nails Thick disfigured discolored nails with subungual debris  from hallux to fifth toes bilaterally. No evidence of bacterial infection or drainage bilaterally.  Orthopedic  No limitations of motion  feet .  No crepitus or effusions noted.  No bony pathology or digital deformities noted.  Skin  normotropic skin with no porokeratosis noted bilaterally.  No signs of infections or ulcers noted.     Onychomycosis  Pain in right toes  Pain in left toes  Consent was obtained for treatment procedures.   Mechanical debridement of nails 1-5  bilaterally performed with a nail nipper.  Filed with dremel without incident.    Return office visit  4 months                Told patient to return for periodic foot care and evaluation due to potential at risk complications.   Helane Gunther DPM

## 2023-08-01 ENCOUNTER — Ambulatory Visit

## 2023-08-03 ENCOUNTER — Other Ambulatory Visit: Payer: Self-pay | Admitting: Family Medicine

## 2023-08-04 ENCOUNTER — Other Ambulatory Visit: Payer: Self-pay | Admitting: Family Medicine

## 2023-08-04 NOTE — Telephone Encounter (Signed)
 Copied from CRM [id number]. Topic: Clinical - Medication Refill >> Aug 04, 2023  2:16 PM Turkey A wrote: Most Recent Primary Care Visit:  Provider: Scherrie Curt  Department: [facility]  Visit Type: OFFICE VISIT  Date: 04/03/2023  Medication: sertraline  (ZOLOFT ) 100 MG tablet  Has the patient contacted their pharmacy? Yes (Agent: If no, request that the patient contact the pharmacy for the refill. If patient does not wish to contact the pharmacy document the reason why and proceed with request.) (Agent: If yes, when and what did the pharmacy advise?)  Is this the correct pharmacy for this prescription? Yes If no, delete pharmacy and type the correct one.  This is the patient's preferred pharmacy:  [id number] - [place], [place] - [address] AT [facility] [address] [place] [address] Phone: [phone] Fax: [phone]   Has the prescription been filled recently? No  Is the patient out of the medication? Yes  Has the patient been seen for an appointment in the last year OR does the patient have an upcoming appointment? Yes  Can we respond through MyChart? Yes  Agent: Please be advised that Rx refills may take up to 3 business days. We ask that you follow-up with your pharmacy.

## 2023-08-07 ENCOUNTER — Telehealth: Payer: Self-pay | Admitting: Family Medicine

## 2023-08-07 MED ORDER — SERTRALINE HCL 100 MG PO TABS: 100.0000 mg | ORAL_TABLET | Freq: Every day | ORAL | 1 refills | Status: DC

## 2023-08-07 NOTE — Telephone Encounter (Signed)
 Copied from CRM [id number]. Topic: Clinical - Prescription Issue >> Aug 07, 2023  3:01 PM Kita Perish H wrote: Reason for CRM: Patients wife wants to verify dosage of the sertraline  (ZOLOFT ) 100 MG tablet and check status of refill, refill request states that it was sent to pharmacy today and discontinued by provider today. Patients wife also states should be 180 pills since patient has been taking medication twice a day. States the bottle has both once a day and twice a day and states provider told them to take twice a day, please reach out to patient.  [place] [phone]

## 2023-08-07 NOTE — Telephone Encounter (Signed)
 Spoke with pt's wife. She is aware that his prescription has been sent in and I have relayed to her how the pt should be taking his medication.

## 2023-08-08 ENCOUNTER — Other Ambulatory Visit: Payer: Self-pay | Admitting: Family Medicine

## 2023-08-08 ENCOUNTER — Telehealth: Payer: Self-pay

## 2023-08-08 NOTE — Telephone Encounter (Signed)
 Copied from CRM [id number]. Topic: Clinical - Medication Question >> Aug 08, 2023 11:23 AM Bambi Bonine D wrote: Reason for CRM: Patient's wife is calling in regards to the medication for sertraline  (ZOLOFT ) 100 MG tablet. Wife stated that they went to pick up the medication today and the pharmacy stated they didn't have the medication for the patient. I informed the wife that the medication was approved and the pharmacy should have the medication. Wife stated that she would call back later to verify with the pharmacy.

## 2023-08-13 ENCOUNTER — Other Ambulatory Visit: Payer: Self-pay | Admitting: Internal Medicine

## 2023-08-13 DIAGNOSIS — E1159 Type 2 diabetes mellitus with other circulatory complications: Secondary | ICD-10-CM

## 2023-08-23 IMAGING — MR MR HEAD W/O CM
12 series · 48 of 48 positions shown · non-contrast
Comparison: 05/19/2021

CLINICAL DATA: Neuro deficit, acute, stroke suspected 2 weeks ago,
concern for additional subacute stroke

EXAM:
MRI HEAD WITHOUT CONTRAST
TECHNIQUE: Multiplanar, multiecho pulse sequences of the brain and surrounding
structures were obtained without intravenous contrast.

[Series 5: T1 · sagittal · 4.0mm · 0.75mm/px · 1 of 31 slices shown (1 of 2)]
[im 1/31]
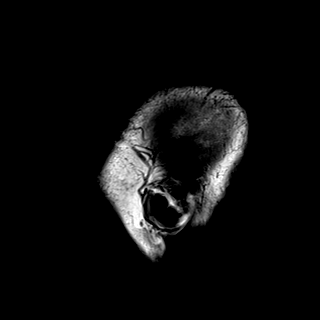

[Series 6: DWI · axial · 3.0mm · 0.94mm/px · z∈[-61,+92]mm · 9 of 180 slices shown (1 of 3)]
[im 1/180]
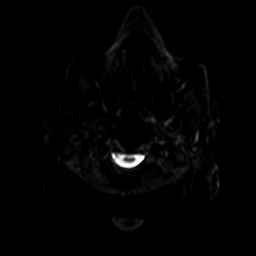
[im 23/180]
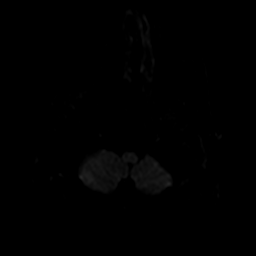
[im 45/180]
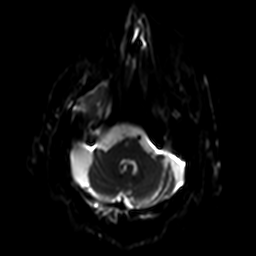
[im 68/180]
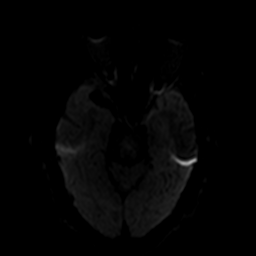
[im 90/180]
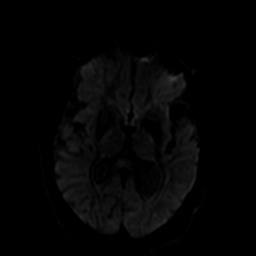
[im 112/180]
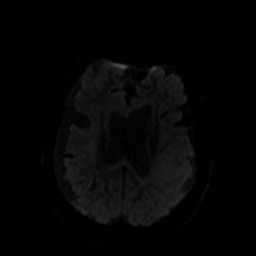
[im 135/180]
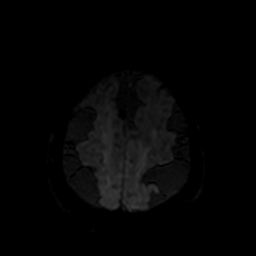
[im 157/180]
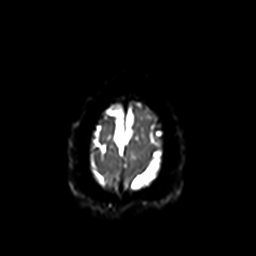
[im 180/180]
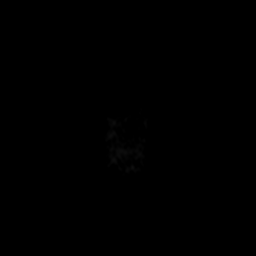

[Series 7: ax dwi_tracew · axial · 3.0mm · 0.94mm/px · z∈[-61,+92]mm · 5 of 90 slices shown]
[im 1/90]
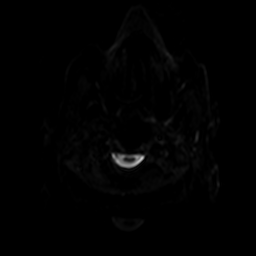
[im 23/90]
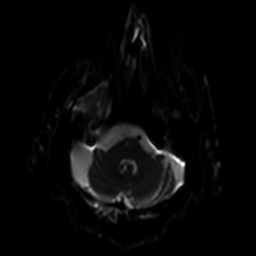
[im 45/90]
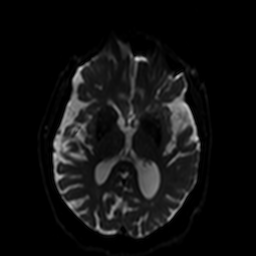
[im 67/90]
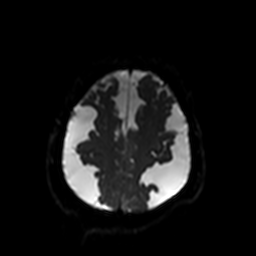
[im 90/90]
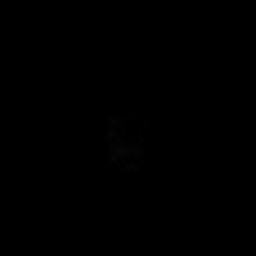

[Series 8: ax dwi_adc · axial · 3.0mm · 0.94mm/px · z∈[-61,+92]mm · 2 of 45 slices shown]
[im 1/45]
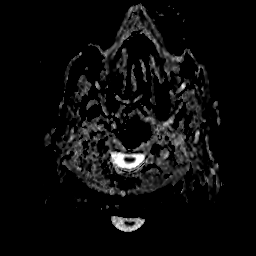
[im 45/45]
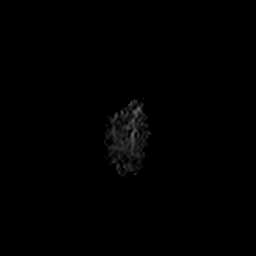

[Series 9: DWI · coronal · 5.0mm · 1.44mm/px · 4 of 70 slices shown (2 of 3)]
[im 1/70]
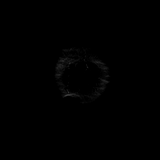
[im 24/70]
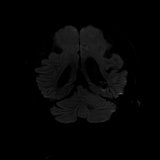
[im 47/70]
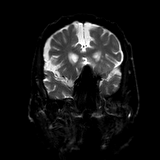
[im 70/70]
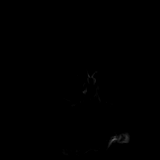

[Series 10: DWI · coronal · 5.0mm · 1.44mm/px · 2 of 35 slices shown (3 of 3)]
[im 1/35]
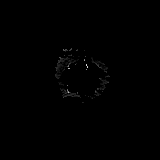
[im 35/35]
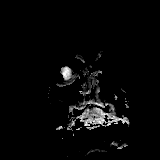

[Series 11: T2 · axial · 4.0mm · 0.36mm/px · z∈[-61,+90]mm · 2 of 31 slices shown (1 of 2)]
[im 1/31]
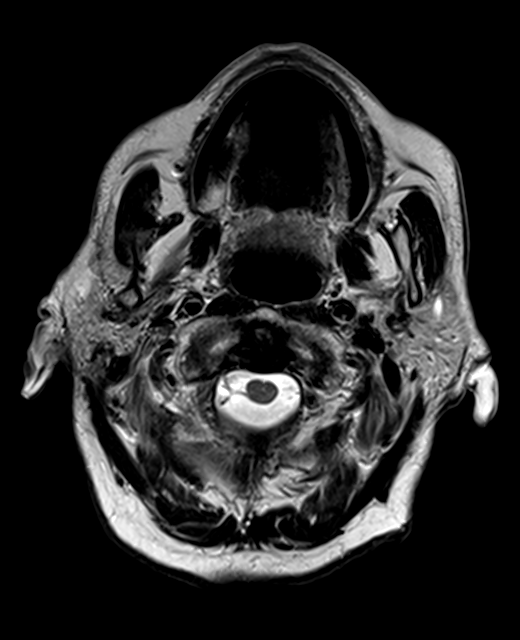
[im 31/31]
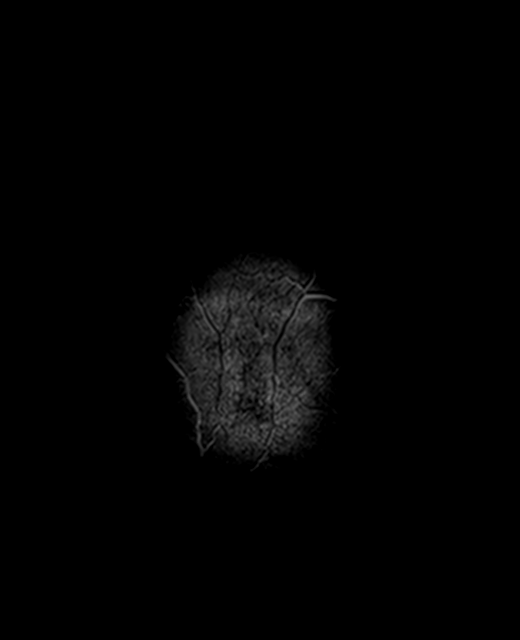

[Series 12: FLAIR · axial · 3.0mm · 0.72mm/px · z∈[-60,+96]mm · 2 of 28 slices shown]
[im 1/28]
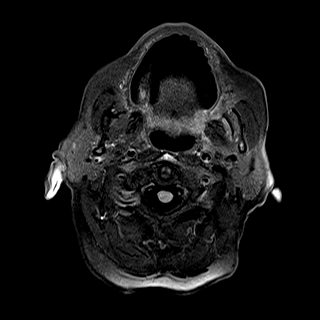
[im 28/28]
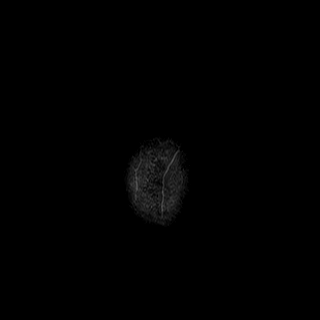

[Series 13: mip_images(sw) · axial · 12.0mm · 0.90mm/px · z∈[-49,+78]mm · 5 of 89 slices shown]
[im 1/89]
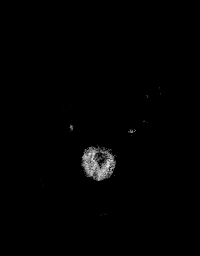
[im 23/89]
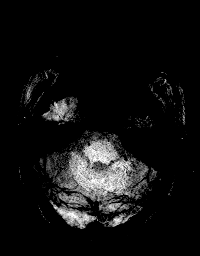
[im 45/89]
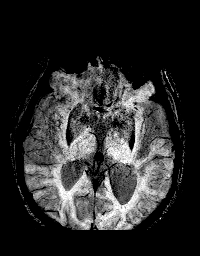
[im 67/89]
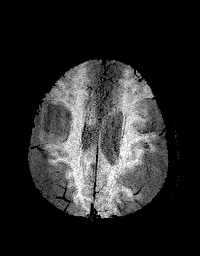
[im 89/89]
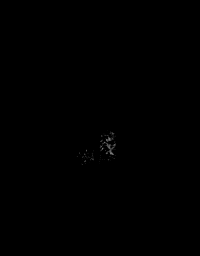

[Series 14: swi_images · axial · 1.5mm · 0.90mm/px · z∈[-54,+83]mm · 5 of 96 slices shown]
[im 1/96]
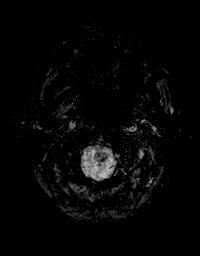
[im 24/96]
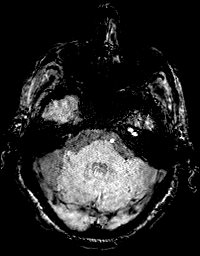
[im 48/96]
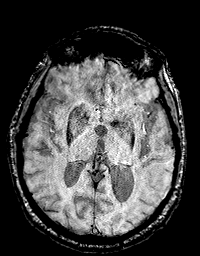
[im 72/96]
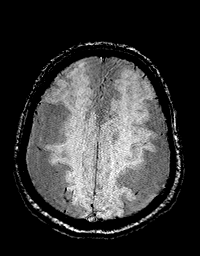
[im 96/96]
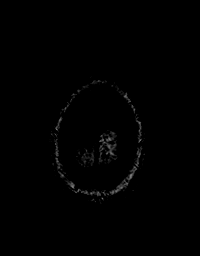

[Series 15: T1 · axial · 1.0mm · 0.94mm/px · z∈[-57,+96]mm · 9 of 160 slices shown (2 of 2)]
[im 1/160]
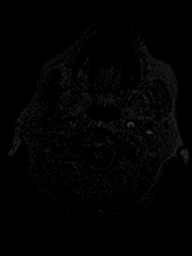
[im 20/160]
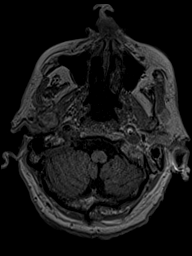
[im 40/160]
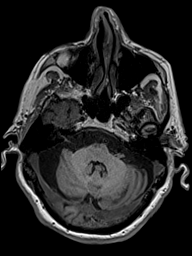
[im 60/160]
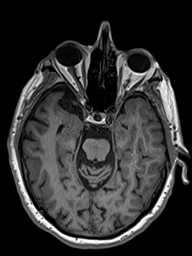
[im 80/160]
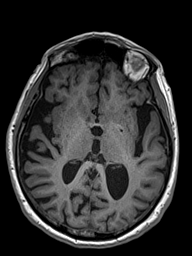
[im 100/160]
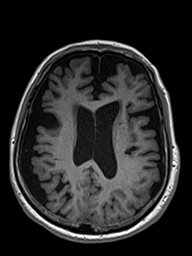
[im 120/160]
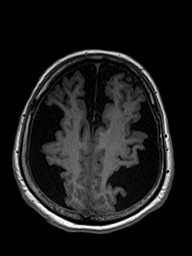
[im 140/160]
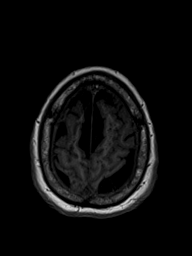
[im 160/160]
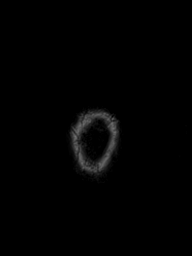

[Series 16: T2 · coronal · 4.0mm · 0.36mm/px · 2 of 35 slices shown (2 of 2)]
[im 1/35]
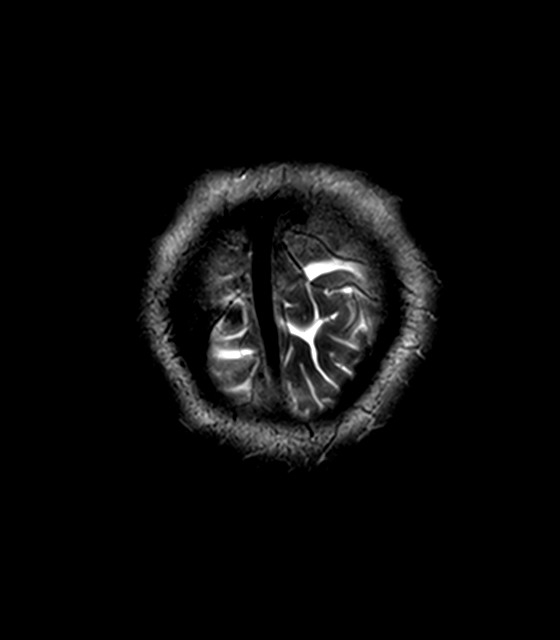
[im 35/35]
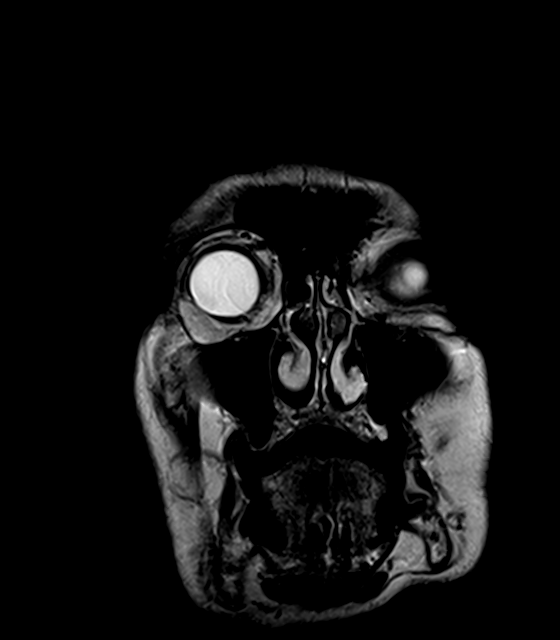

[48 of 48 positions shown; findings below may reference images not displayed]

FINDINGS: Brain: There is no acute infarction or intracranial hemorrhage.
There is no intracranial mass, mass effect, or edema. There is no
hydrocephalus. Prominence of the ventricles and sulci reflects
stable parenchymal volume loss. Significant prominence of
extra-axial spaces along the high frontoparietal convexities and
cerebellar hemispheres. As before, superimposed subdural hygromas
may be present. Patchy foci of T2 hyperintensity in the
supratentorial white matter is nonspecific but may reflect minor
chronic microvascular ischemic changes.

Vascular: Major vessel flow voids at the skull base are preserved.

Skull and upper cervical spine: Normal marrow signal is preserved.

Sinuses/Orbits: Paranasal sinuses are aerated. Bilateral lens
replacements.

Other: Sella is unremarkable.  Mastoid air cells are clear.
IMPRESSION: No evidence of recent infarction. No significant change since prior
study.

## 2023-09-21 ENCOUNTER — Other Ambulatory Visit: Payer: Self-pay | Admitting: Family Medicine

## 2023-10-09 ENCOUNTER — Ambulatory Visit: Payer: Self-pay

## 2023-10-09 NOTE — Telephone Encounter (Signed)
 No provider follow up needed - information only.  Blood in urine starting today 1x.   Pt feels a bit off. Has had chills - but no fever at this time. Ate well today but not much yesterday.  No interventions other than rest.  Appt for tomorrow morning.                 Copied from CRM [id number]. Topic: Clinical - Red Word Triage >> Oct 09, 2023  4:19 PM Chiquita SQUIBB wrote: Red Word that prompted transfer to Nurse Triage: Patients wife is on the phone stating that the patient is bleeding and has blood in his urine. Patient also feels very sick. Reason for Disposition  Blood in urine  (Exception: Could be normal menstrual bleeding.)  Answer Assessment - Initial Assessment Questions 1. COLOR of URINE: Describe the color of the urine.  (e.g., tea-colored, pink, red, bloody) Do you have blood clots in your urine? (e.g., none, pea, grape, small coin)     red 2. ONSET: When did the bleeding start?      today 3. EPISODES: How many times has there been blood in the urine? or How many times today?     1 times 4. PAIN with URINATION: Is there any pain with passing your urine? If Yes, ask: How bad is the pain?  (Scale 1-10; or mild, moderate, severe)    - MILD: Complains slightly about urination hurting.    - MODERATE: Interferes with normal activities.      - SEVERE: Excruciating, unwilling or unable to urinate because of the pain.      none 5. FEVER: Do you have a fever? If Yes, ask: What is your temperature, how was it measured, and when did it start?     Has had chills 6. ASSOCIATED SYMPTOMS: Are you passing urine more frequently than usual?     no 7. OTHER SYMPTOMS: Do you have any other symptoms? (e.g., back/flank pain, abdomen pain, vomiting)     Feels sick  Protocols used: Urine - Blood In-A-AH

## 2023-10-09 NOTE — Telephone Encounter (Signed)
 FYI; patient seeing Dr. Jimmy tomorrow at 9:45 am.

## 2023-10-10 ENCOUNTER — Observation Stay (HOSPITAL_COMMUNITY)
Admission: EM | Admit: 2023-10-10 | Discharge: 2023-10-14 | Disposition: A | Discharge status: Hospice | Attending: Internal Medicine | Admitting: Internal Medicine

## 2023-10-10 ENCOUNTER — Encounter: Payer: Self-pay | Admitting: Internal Medicine

## 2023-10-10 ENCOUNTER — Other Ambulatory Visit: Payer: Self-pay

## 2023-10-10 ENCOUNTER — Observation Stay (HOSPITAL_COMMUNITY)

## 2023-10-10 ENCOUNTER — Encounter (HOSPITAL_COMMUNITY): Payer: Self-pay

## 2023-10-10 ENCOUNTER — Ambulatory Visit (INDEPENDENT_AMBULATORY_CARE_PROVIDER_SITE_OTHER): Admitting: Internal Medicine

## 2023-10-10 VITALS — BP 124/74 | HR 78 | Temp 97.9°F | Ht 69.0 in | Wt 176.8 lb

## 2023-10-10 DIAGNOSIS — E875 Hyperkalemia: Secondary | ICD-10-CM | POA: Insufficient documentation

## 2023-10-10 DIAGNOSIS — Z85118 Personal history of other malignant neoplasm of bronchus and lung: Secondary | ICD-10-CM | POA: Insufficient documentation

## 2023-10-10 DIAGNOSIS — D62 Acute posthemorrhagic anemia: Secondary | ICD-10-CM | POA: Insufficient documentation

## 2023-10-10 DIAGNOSIS — E1122 Type 2 diabetes mellitus with diabetic chronic kidney disease: Secondary | ICD-10-CM | POA: Insufficient documentation

## 2023-10-10 DIAGNOSIS — I2510 Atherosclerotic heart disease of native coronary artery without angina pectoris: Secondary | ICD-10-CM | POA: Diagnosis not present

## 2023-10-10 DIAGNOSIS — E11641 Type 2 diabetes mellitus with hypoglycemia with coma: Secondary | ICD-10-CM | POA: Insufficient documentation

## 2023-10-10 DIAGNOSIS — Z87891 Personal history of nicotine dependence: Secondary | ICD-10-CM | POA: Diagnosis not present

## 2023-10-10 DIAGNOSIS — N183 Chronic kidney disease, stage 3 unspecified: Secondary | ICD-10-CM | POA: Diagnosis not present

## 2023-10-10 DIAGNOSIS — R918 Other nonspecific abnormal finding of lung field: Secondary | ICD-10-CM | POA: Diagnosis not present

## 2023-10-10 DIAGNOSIS — R2681 Unsteadiness on feet: Secondary | ICD-10-CM | POA: Diagnosis not present

## 2023-10-10 DIAGNOSIS — N179 Acute kidney failure, unspecified: Secondary | ICD-10-CM | POA: Diagnosis not present

## 2023-10-10 DIAGNOSIS — R319 Hematuria, unspecified: Secondary | ICD-10-CM

## 2023-10-10 DIAGNOSIS — I129 Hypertensive chronic kidney disease with stage 1 through stage 4 chronic kidney disease, or unspecified chronic kidney disease: Secondary | ICD-10-CM | POA: Diagnosis not present

## 2023-10-10 DIAGNOSIS — F0390 Unspecified dementia without behavioral disturbance: Secondary | ICD-10-CM | POA: Insufficient documentation

## 2023-10-10 DIAGNOSIS — R310 Gross hematuria: Secondary | ICD-10-CM | POA: Diagnosis present

## 2023-10-10 DIAGNOSIS — R339 Retention of urine, unspecified: Secondary | ICD-10-CM | POA: Diagnosis not present

## 2023-10-10 DIAGNOSIS — E785 Hyperlipidemia, unspecified: Secondary | ICD-10-CM | POA: Diagnosis not present

## 2023-10-10 DIAGNOSIS — Z8546 Personal history of malignant neoplasm of prostate: Secondary | ICD-10-CM | POA: Insufficient documentation

## 2023-10-10 DIAGNOSIS — D631 Anemia in chronic kidney disease: Secondary | ICD-10-CM | POA: Insufficient documentation

## 2023-10-10 LAB — COMPREHENSIVE METABOLIC PANEL WITH GFR
ALT: 10 U/L (ref 0–44)
AST: 12 U/L — ABNORMAL LOW (ref 15–41)
Albumin: 3.5 g/dL (ref 3.5–5.0)
Alkaline Phosphatase: 102 U/L (ref 38–126)
Anion gap: 11 (ref 5–15)
BUN: 40 mg/dL — ABNORMAL HIGH (ref 8–23)
CO2: 22 mmol/L (ref 22–32)
Calcium: 8.4 mg/dL — ABNORMAL LOW (ref 8.9–10.3)
Chloride: 102 mmol/L (ref 98–111)
Creatinine, Ser: 2.46 mg/dL — ABNORMAL HIGH (ref 0.61–1.24)
GFR, Estimated: 24 mL/min — ABNORMAL LOW (ref >60)
Glucose, Bld: 248 mg/dL — ABNORMAL HIGH (ref 70–99)
Potassium: 5 mmol/L (ref 3.5–5.1)
Sodium: 135 mmol/L (ref 135–145)
Total Bilirubin: 0.6 mg/dL (ref 0.0–1.2)
Total Protein: 6.6 g/dL (ref 6.5–8.1)

## 2023-10-10 LAB — URINALYSIS, ROUTINE W REFLEX MICROSCOPIC
Bacteria, UA: NONE SEEN
Bilirubin Urine: NEGATIVE
Glucose, UA: 150 mg/dL — AB
Ketones, ur: NEGATIVE mg/dL
Leukocytes,Ua: NEGATIVE
Nitrite: NEGATIVE
Protein, ur: 100 mg/dL — AB
RBC / HPF: >50 RBC/hpf (ref 0–5)
Specific Gravity, Urine: 1.016 (ref 1.005–1.030)
pH: 5 (ref 5.0–8.0)

## 2023-10-10 LAB — CBC WITH DIFFERENTIAL/PLATELET
Abs Immature Granulocytes: 0.55 K/uL — ABNORMAL HIGH (ref 0.00–0.07)
Basophils Absolute: 0 K/uL (ref 0.0–0.1)
Basophils Relative: 0 %
Eosinophils Absolute: 0 K/uL (ref 0.0–0.5)
Eosinophils Relative: 0 %
HCT: 28.8 % — ABNORMAL LOW (ref 39.0–52.0)
Hemoglobin: 9.3 g/dL — ABNORMAL LOW (ref 13.0–17.0)
Immature Granulocytes: 7 %
Lymphocytes Relative: 3 %
Lymphs Abs: 0.2 K/uL — ABNORMAL LOW (ref 0.7–4.0)
MCH: 31.3 pg (ref 26.0–34.0)
MCHC: 32.3 g/dL (ref 30.0–36.0)
MCV: 97 fL (ref 80.0–100.0)
Monocytes Absolute: 1 K/uL (ref 0.1–1.0)
Monocytes Relative: 13 %
Neutro Abs: 5.9 K/uL (ref 1.7–7.7)
Neutrophils Relative %: 77 %
Platelets: 112 K/uL — ABNORMAL LOW (ref 150–400)
RBC: 2.97 MIL/uL — ABNORMAL LOW (ref 4.22–5.81)
RDW: 12.3 % (ref 11.5–15.5)
WBC: 7.7 K/uL (ref 4.0–10.5)
nRBC: 0 % (ref 0.0–0.2)

## 2023-10-10 LAB — GLUCOSE, CAPILLARY
Glucose-Capillary: 166 mg/dL — ABNORMAL HIGH (ref 70–99)
Glucose-Capillary: 143 mg/dL — ABNORMAL HIGH (ref 70–99)

## 2023-10-10 MED ORDER — INSULIN ASPART 100 UNIT/ML IJ SOLN
0.0000 [IU] | Freq: Every day | INTRAMUSCULAR | Status: DC
  Administered 2023-10-11 – 2023-10-13 (×2): 2 [IU] via SUBCUTANEOUS
  Filled 2023-10-10: qty 0.05

## 2023-10-10 MED ORDER — ONDANSETRON HCL 4 MG/2ML IJ SOLN: 4.0000 mg | Freq: Four times a day (QID) | INTRAMUSCULAR | Status: DC | PRN

## 2023-10-10 MED ORDER — ALBUTEROL SULFATE (2.5 MG/3ML) 0.083% IN NEBU
2.5000 mg | INHALATION_SOLUTION | RESPIRATORY_TRACT | Status: DC | PRN
  Administered 2023-10-13: 2.5 mg via RESPIRATORY_TRACT
  Filled 2023-10-10: qty 3

## 2023-10-10 MED ORDER — ONDANSETRON HCL 4 MG PO TABS: 4.0000 mg | ORAL_TABLET | Freq: Four times a day (QID) | ORAL | Status: DC | PRN

## 2023-10-10 MED ORDER — INSULIN ASPART 100 UNIT/ML IJ SOLN
0.0000 [IU] | Freq: Three times a day (TID) | INTRAMUSCULAR | Status: DC
  Administered 2023-10-10: 3 [IU] via SUBCUTANEOUS
  Administered 2023-10-11: 2 [IU] via SUBCUTANEOUS
  Administered 2023-10-11: 3 [IU] via SUBCUTANEOUS
  Administered 2023-10-11: 5 [IU] via SUBCUTANEOUS
  Administered 2023-10-12: 2 [IU] via SUBCUTANEOUS
  Administered 2023-10-12: 8 [IU] via SUBCUTANEOUS
  Administered 2023-10-12 – 2023-10-13 (×2): 5 [IU] via SUBCUTANEOUS
  Administered 2023-10-13: 2 [IU] via SUBCUTANEOUS
  Administered 2023-10-14: 3 [IU] via SUBCUTANEOUS
  Filled 2023-10-10: qty 0.15

## 2023-10-10 MED ORDER — TRAZODONE HCL 50 MG PO TABS: 25.0000 mg | ORAL_TABLET | Freq: Every evening | ORAL | Status: DC | PRN

## 2023-10-10 MED ORDER — ACETAMINOPHEN 325 MG PO TABS
650.0000 mg | ORAL_TABLET | Freq: Four times a day (QID) | ORAL | Status: DC | PRN
  Administered 2023-10-11 – 2023-10-12 (×2): 650 mg via ORAL
  Filled 2023-10-10 (×2): qty 2

## 2023-10-10 MED ORDER — ACETAMINOPHEN 650 MG RE SUPP: 650.0000 mg | Freq: Four times a day (QID) | RECTAL | Status: DC | PRN

## 2023-10-10 MED ORDER — LACTATED RINGERS IV SOLN: INTRAVENOUS | Status: AC

## 2023-10-10 NOTE — ED Notes (Signed)
 Pts foley cath was inserted at 11:50 with urine return.  Urine is red in color.  Pt stated he felt much relief after insertion.

## 2023-10-10 NOTE — ED Notes (Signed)
 Pt coming POV due to hematuria and not able to urinate since yesterday.

## 2023-10-10 NOTE — Plan of Care (Signed)
  Problem: Metabolic: Goal: Ability to maintain appropriate glucose levels will improve Outcome: Progressing   Problem: Nutritional: Goal: Maintenance of adequate nutrition will improve Outcome: Progressing

## 2023-10-10 NOTE — ED Provider Notes (Signed)
 [place] [facility] AT [facility] Provider Note   CSN: [id number] Arrival date & time: 10/10/23  1110     Patient presents with: Urinary Retention   [name] is a 89 y.o. male.   89 y.o male with a PMH of T2DM, adenocarcinoma of the left lung presents to the ED accompanied by spouse with a chief complaint of urinary retention.  Patient's wife provides most of the history, reports that for the past 2 days he has been mostly in bed, fatigue, feeling not like himself.  He is having a couple episodes of gross hematuria, reports 2 days ago he voided and there was small amounts of blood noted in the toilet bowl.  Subsequently, yesterday reports he was only able to void a few drops, but the wife reports that blood was present on the toilet bowl with some small clots.  He has never had any kidney stones, has not taken any medication for improvement in symptoms.  Patient does feel that his abdomen is distended, and he does have some bladder spasms. Also endorse nausea. No fever, no vomiting or other complaints reported.   The history is provided by the patient.       Prior to Admission medications   Medication Sig Start Date End Date Taking? Authorizing Provider  atorvastatin  (LIPITOR) 20 MG tablet TAKE 1 TABLET BY MOUTH EVERY DAY 08/03/23   Copland, Jacques, MD  donepezil  (ARICEPT ) 10 MG tablet TAKE 1 TABLET BY MOUTH EVERYDAY AT BEDTIME Patient taking differently: Take 10 mg by mouth at bedtime. 12/09/22   Copland, Jacques, MD  ferrous sulfate  325 (65 FE) MG tablet TAKE 1 TABLET BY MOUTH EVERY DAY WITH BREAKFAST Patient taking differently: Take 325 mg by mouth daily with breakfast. 12/09/22   Copland, Jacques, MD  glipiZIDE  (GLUCOTROL ) 10 MG tablet Take 1 tablet (10 mg total) by mouth 2 (two) times daily before a meal. 08/14/23   Shamleffer, Donell Cardinal, MD  OZEMPIC , 0.25 OR 0.5 MG/DOSE, 2 MG/3ML SOPN Inject 0.25 mg into the skin once a week. 05/06/23   [provider]  pantoprazole  (PROTONIX ) 40 MG tablet TAKE 1 TABLET BY MOUTH EVERY DAY 12/09/22   Copland, Jacques, MD  Propylene Glycol (SYSTANE BALANCE) 0.6 % SOLN Apply 1 drop to eye 2 (two) times daily as needed (dry eyes).    [provider]  sertraline  (ZOLOFT ) 100 MG tablet Take 1 tablet (100 mg total) by mouth daily. 08/07/23   Copland, Jacques, MD  Specialty Vitamins Products (ICAPS LUTEIN  & ZEAXANTHIN) TBEC Take 1 tablet by mouth 2 (two) times daily. 04/25/22   Copland, Jacques, MD  tamsulosin  (FLOMAX ) 0.4 MG CAPS capsule TAKE 1 CAPSULE BY MOUTH EVERY DAY AFTER SUPPER Patient taking differently: Take 0.4 mg by mouth daily after supper. 12/09/22   Copland, Jacques, MD  triamcinolone  cream (KENALOG ) 0.1 % Apply 1 application topically 2 (two) times daily. 06/02/21   Copland, Jacques, MD    Allergies: Sulfa antibiotics    Review of Systems  Constitutional:  Negative for fever.  Respiratory:  Negative for shortness of breath.   Cardiovascular:  Negative for chest pain.  Gastrointestinal:  Positive for abdominal distention, abdominal pain and nausea. Negative for vomiting.  Genitourinary:  Positive for decreased urine volume and difficulty urinating.  Musculoskeletal:  Negative for back pain.    Updated Vital Signs BP (!) 150/75 (BP Location: Left Arm)   Pulse (!) 102   Temp 98.2 F (36.8 C) (Oral)  Resp 18   Wt 78.9 kg   SpO2 99%   BMI 25.70 kg/m   Physical Exam Vitals and nursing note reviewed.  Constitutional:      Appearance: Normal appearance.  HENT:     Head: Normocephalic and atraumatic.  Cardiovascular:     Rate and Rhythm: Normal rate.  Abdominal:     General: There is distension.     Palpations: Abdomen is soft.     Tenderness: There is no abdominal tenderness.  Musculoskeletal:     Cervical back: Normal range of motion and neck supple.  Skin:    General: Skin is warm and dry.  Neurological:     Mental Status: He is alert and oriented to person, place,  and time.     (all labs ordered are listed, but only abnormal results are displayed) Labs Reviewed  CBC WITH DIFFERENTIAL/PLATELET - Abnormal; Notable for the following components:      Result Value   RBC 2.97 (*)    Hemoglobin 9.3 (*)    HCT 28.8 (*)    Platelets 112 (*)    Lymphs Abs 0.2 (*)    Abs Immature Granulocytes 0.55 (*)    All other components within normal limits  COMPREHENSIVE METABOLIC PANEL WITH GFR - Abnormal; Notable for the following components:   Glucose, Bld 248 (*)    BUN 40 (*)    Creatinine, Ser 2.46 (*)    Calcium  8.4 (*)    AST 12 (*)    GFR, Estimated 24 (*)    All other components within normal limits  URINALYSIS, ROUTINE W REFLEX MICROSCOPIC - Abnormal; Notable for the following components:   Color, Urine RED (*)    APPearance CLOUDY (*)    Glucose, UA 150 (*)    Hgb urine dipstick MODERATE (*)    Protein, ur 100 (*)    All other components within normal limits  URINE CULTURE    EKG: None  Radiology: No results found.   Procedures   Medications Ordered in the ED - No data to display  Clinical Course as of 10/10/23 1431  Tue Oct 10, 2023  1339 Hgb urine dipstickROLLEN): MODERATE [JS]  1339 Appearance(!): CLOUDY [JS]    Clinical Course User Index [JS] Cinzia Devos, PA-C                                 Medical Decision Making Amount and/or Complexity of Data Reviewed Labs: ordered. Decision-making details documented in ED Course.  Risk Decision regarding hospitalization.   This patient presents to the ED for concern of hematuria, this involves a number of treatment options, and is a complaint that carries with it a high risk of complications and morbidity.  The differential diagnosis includes obstruction, infection, malignancy.   Co morbidities: Discussed in HPI   Brief History:  See HPI.   EMR reviewed including pt PMHx, past surgical history and past visits to ER.   See HPI for more details   Lab Tests:  I ordered  and independently interpreted labs.  The pertinent results include:    CBC with no leukocytosis, hemoglobin is 9.3, change from 12 6 months ago.  CMP with no electrolyte derangement, creatinine level has worsened since 6 months ago at 2.46 from 1.69.  BUN is elevated at 40, GFR 24.  UA with some moderate hemoglobin, no nitrates, no leukocytes 0-5 white blood cell count.  Urine culture has  been sent off.   Imaging Studies:  No imaging studies ordered for this patient   Medicines ordered:  N/A  Critical Interventions:   Foley catheter was placed after bladder scan showed greater than 300 cc of urine.  Consults:  I requested consultation with Urology,  and discussed lab and imaging findings as well as pertinent plan - they recommend:   Reevaluation:  After the interventions noted above I re-evaluated patient and found that they have :improved   Social Determinants of Health:  The patient's social determinants of health were a factor in the care of this patient    Problem List / ED course:  Patient presenting with 2 days of hematuria, worsening urinary retention over the last 2 days.  According to records saw family medicine this morning and sent here for placement of catheter.  He did have a prostatectomy about a year ago, but never received radiation.  He does not have a urologist as he currently follows with therefore urologist on-call was contacted. Labs here are significant for no infection in his urinalysis aside from some moderate hemoglobin present.  His CBC does look concerning as hemoglobin has dropped 3 points in 6 months.  CMP with no electrolyte normality, creatinine has also worsened at 2.46, BUN is elevated at 40, and GFR is 24.  I do not feel that patient is safe for disposition home at this time. I did speak to urology at this time, because of patient's AKI, along with decreasing hemoglobin, I do feel that patient warrants admission to the hospital.  I did speak to  Dr. Roxane hospital service, appreciate his assistance.  Patient will be admitted for further management.  Dispostion:  After consideration of the diagnostic results and the patients response to treatment, I feel that the patent would benefit from further management of AKI, and hematuria.     Portions of this note were generated with Scientist, clinical (histocompatibility and immunogenetics). Dictation errors may occur despite best attempts at proofreading.      Final diagnoses:  Urinary retention    ED Discharge Orders     None          Letticia Bhattacharyya, PA-C 10/10/23 1432    [name], [name], DO 10/11/23 [phone]

## 2023-10-10 NOTE — Progress Notes (Signed)
 Subjective:    Patient ID: [name], male    DOB: December 13, 1930, 89 y.o.   MRN: [id number]  HPI Here due to urinary problems With wife and daughter  Has not been able to pass urine since 2AM Has burning and saw blood as well Wife saw lots of blood in commode Has burning and urgency---unable to pass it at home  No fever No chills or sweats---but gets spasm and shaky   Prostatectomy long ago--at least 10 years No radiation No current urologist Last PSA <0.1 a year ago  Current Outpatient Medications on File Prior to Visit  Medication Sig Dispense Refill   atorvastatin  (LIPITOR) 20 MG tablet TAKE 1 TABLET BY MOUTH EVERY DAY 90 tablet 0   Continuous Blood Gluc Receiver (FREESTYLE LIBRE 2 READER) DEVI Use to check blood sugar continous 1 each 0   Continuous Glucose Sensor (FREESTYLE LIBRE 2 SENSOR) MISC USE TO CHECK BLOOD SUGAR CONTINUOUS 2 each 11   donepezil  (ARICEPT ) 10 MG tablet TAKE 1 TABLET BY MOUTH EVERYDAY AT BEDTIME 90 tablet 3   ferrous sulfate  325 (65 FE) MG tablet TAKE 1 TABLET BY MOUTH EVERY DAY WITH BREAKFAST 90 tablet 3   glipiZIDE  (GLUCOTROL ) 10 MG tablet Take 1 tablet (10 mg total) by mouth 2 (two) times daily before a meal. 180 tablet 0   OZEMPIC , 0.25 OR 0.5 MG/DOSE, 2 MG/3ML SOPN Inject 0.25 mg into the skin once a week.     pantoprazole  (PROTONIX ) 40 MG tablet TAKE 1 TABLET BY MOUTH EVERY DAY 90 tablet 3   Propylene Glycol (SYSTANE BALANCE) 0.6 % SOLN Apply 1 drop to eye 2 (two) times daily as needed (dry eyes).     sertraline  (ZOLOFT ) 100 MG tablet Take 1 tablet (100 mg total) by mouth daily. 90 tablet 1   Specialty Vitamins Products (ICAPS LUTEIN  & ZEAXANTHIN) TBEC Take 1 tablet by mouth 2 (two) times daily. 120 tablet 6   tamsulosin  (FLOMAX ) 0.4 MG CAPS capsule TAKE 1 CAPSULE BY MOUTH EVERY DAY AFTER SUPPER 90 capsule 3   triamcinolone  cream (KENALOG ) 0.1 % Apply 1 application topically 2 (two) times daily. 454 g 0   No current facility-administered  medications on file prior to visit.    Allergies  Allergen Reactions   Sulfa Antibiotics Nausea Only    Past Medical History:  Diagnosis Date   Adenocarcinoma of left lung, stage 1 (HCC) 2001   T1N0 stage I  adenoca left lung resected 01/03/00    Alzheimer's disease (HCC)    CAD (coronary artery disease) 2014   a. 09/2012 Cath: LM nl, LAD 50-60p, D1 60-70 m, D1 50ost, LCX nl, RCA min irregs, EF 55-65%.   Generalized anxiety disorder 01/08/2007   Qualifier: Diagnosis of  By: [name], [name]     History of colonic polyps 07/25/2011   Macular degeneration of both eyes 01/21/2014   Nonmelanoma skin cancer 07/25/2011   Multiple lesions excised face/nose   Pericarditis    a. 09/2012 with effusion and tamponade, s/p window.  b.  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 SCC IN SITU TX CX3   SCC (squamous cell carcinoma) 04/12/2005   RIGHT NECK SUP. SCC IN SITU TX EXC   SCC (squamous cell carcinoma) 04/16/2013   LEFT TEMPLE SCC IN SITU TX CX3 5FU   SCC (squamous cell carcinoma) 04/16/2013   BELOW LEFT EYE SCC IN SITU TX CX3 5FU   SCC (squamous cell carcinoma) 04/16/2013   BELOW RIGHT EYE SCC IN SITU TX CX3 5FU   SCC (squamous cell carcinoma) 08/04/2014   LEFT CHEEK SCC IN SITU TX CX3 5FU   SCC (squamous cell carcinoma) 11/27/2018   LEFT TEMPLE SCC IN SITU TX WITH BX   SCC  (squamous cell carcinoma)    SCC (squamous cell carcinoma)    SCC (squamous cell carcinoma)    SCC (squamous cell carcinoma) Well Diff 09/13/2016   Tip of Nose SCC WELL DIFF TX (MOH's), and RIGHT INNER EYE SUP. SCC IN SITU TX TO WATCH   SCC (squamous cell carcinoma) Well Diff 05/30/2017   Right Cheekbone (Cx3,5FU) and Under Left Eye (Cx3,5FU)   Squamous cell carcinoma in situ (SCCIS) 04/12/2005   Right Neck Inf (Cx3), Right Neck Sup (Exc), and Bulb of Nose (Cx3,Exc)   Squamous cell carcinoma in situ (SCCIS) 09/27/2005   Nose (Cx3,Exc)   Squamous cell carcinoma in situ (SCCIS) 02/18/2013   Below Left Eye (tx p bx)   Squamous cell carcinoma in situ (SCCIS) 04/16/2013   Left Temple (Cx3,5FU), Below Left Eye (Cx3,5FU), Below Right Eye (Cx3,5FU)   Squamous cell carcinoma in situ (SCCIS) 08/04/2014   Left Cheek (Cx3,5FU)   Squamous cell carcinoma in situ (SCCIS) 09/13/2016   Right Inner Eye Sup. 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   Type 2 diabetes mellitus with vascular disease (HCC) 05/21/2007   Qualifier: Diagnosis of  By: [name], [name]     Type 2 DM with CKD stage 3 and hypertension (HCC) 07/16/2015   Unspecified essential hypertension     Past Surgical History:  Procedure Laterality Date   HERNIA REPAIR     LEFT HEART CATHETERIZATION WITH CORONARY ANGIOGRAM N/A 09/13/2012   Procedure: LEFT HEART CATHETERIZATION WITH CORONARY ANGIOGRAM;  Surgeon: Ozell Fell, MD;  Location: [facility] CATH LAB;  Service: Cardiovascular;  Laterality: N/A;   LOBECTOMY  2001   upper left   PERICARDIAL TAP N/A 09/16/2012   Procedure: PERICARDIAL TAP;  Surgeon: Victory LELON Claudene DOUGLAS, MD;  Location: [facility] CATH LAB;  Service:  Cardiovascular;  Laterality: N/A;   PROSTATECTOMY  2003   SUBXYPHOID PERICARDIAL WINDOW N/A 09/16/2012   Procedure: SUBXYPHOID PERICARDIAL WINDOW;  Surgeon: Sudie VEAR Laine, MD;  Location: [facility] OR;  Service: Thoracic;  Laterality: N/A;   TONSILLECTOMY      Family History  Problem Relation Age of Onset   Cancer Father  colon   Dementia Mother    Diabetes Mother    Cancer Brother        lung    Social History   Socioeconomic History   Marital status: Married    Spouse name: Not on file   Number of children: Not on file   Years of education: Not on file   Highest education level: Not on file  Occupational History   Occupation: retired  Tobacco Use   Smoking status: Former    Current packs/day: 0.00    Average packs/day: 2.0 packs/day for 42.0 years (84.0 ttl pk-yrs)    Types: Cigarettes    Start date: 02/25/1953    Quit date: 04/04/1984    Years since quitting: 39.5   Smokeless tobacco: Former    Types: Chew    Quit date: 07/27/1986   Tobacco comments:    quit tobacco 26 years ago  Vaping Use   Vaping status: Never Used  Substance and Sexual Activity   Alcohol use: No    Alcohol/week: 0.0 standard drinks of alcohol   Drug use: No   Sexual activity: Not Currently  Other Topics Concern   Not on file  Social History Narrative   Lives in [place], [place]   Regular exercise--no, mowing grass   Diet: fruit and veggies   Social Drivers of Health   Financial Resource Strain: Low Risk  (08/10/2021)   Overall Financial Resource Strain (CARDIA)    Difficulty of Paying Living Expenses: Not hard at all  Food Insecurity: No Food Insecurity (08/10/2021)   Hunger Vital Sign    Worried About Running Out of Food in the Last Year: Never true    Ran Out of Food in the Last Year: Never true  Transportation Needs: No Transportation Needs (08/10/2021)   PRAPARE - Administrator, Civil Service (Medical): No    Lack of Transportation (Non-Medical): No  Physical Activity:  Insufficiently Active (08/10/2021)   Exercise Vital Sign    Days of Exercise per Week: 3 days    Minutes of Exercise per Session: 30 min  Stress: No Stress Concern Present (08/10/2021)   [employer] of Occupational Health - Occupational Stress Questionnaire    Feeling of Stress : Only a little  Social Connections: Moderately Isolated (08/10/2021)   Social Connection and Isolation Panel    Frequency of Communication with Friends and Family: Once a week    Frequency of Social Gatherings with Friends and Family: Twice a week    Attends Religious Services: Never    Database administrator or Organizations: No    Attends Banker Meetings: Never    Marital Status: Married  Catering manager Violence: Not At Risk (08/10/2021)   Humiliation, Afraid, Rape, and Kick questionnaire    Fear of Current or Ex-Partner: No    Emotionally Abused: No    Physically Abused: No    Sexually Abused: No   Review of Systems Some nausea when trying to void--no vomiting Decreased appetite with ozempic --but last dose over a week ago     Objective:   Physical Exam Constitutional:      Appearance: Normal appearance.  Abdominal:     Palpations: Abdomen is soft.     Comments: Bladder percussed to umbilicus No sig tenderness  Neurological:     Mental Status: He is alert.            Assessment & Plan:

## 2023-10-10 NOTE — Consult Note (Addendum)
 Attestation: I independently reviewed and examined the patient myself.  Per family patient endorsed hematuria as well as flank pain prior to coming into the hospital.  Upon presenting catheter was placed with dark red urine was drained.  Per the patient he has had a history of prostatectomy for prostate cancer but has never had radiation.  Due to age and sex as well as presence of gross hematuria feel that gross hematuria workup should be necessary as outpatient.  Imaging recommendations: Recommend renal ultrasound while inpatient as creatinine will not permit a CT with contrast.  Gross hematuria: Continue the catheter no intervention recommended at this time.  Continue fluid intake to washout gross materia urine light pink to Kool-Aid colored no significant clots in the urine.  AKI: Continue catheter continue fluid resuscitation.  Outpatient plans: Will plan for cystoscopy as outpatient.     Urology Consult Note   Requesting Attending Physician:  [name] Service Providing Consult: Urology  Consulting Attending: Dr. Shane   Reason for Consult:  Urinary retention  HPI: [name] is seen in consultation for reasons noted above at the request of [name], Megan Castillo, Castillo. Patient is a 89 year old male presenting to [facility] emergency department with complaints of urinary retention and hematuria.  Patient was remotely seen by our practice and was followed by Dr. Janit, last seen in 2009.  PMH significant for T2DM, lung cancer, squamous cell carcinoma, prostate cancer-s/p retropubic prostatectomy w/ BLND, spermatic cord incision of left hydrocele, urinary frequency, bladder neck contracture, and scrotal abscess.   Patient reports he was unable to urinate since yesterday.  Distention and discomfort were appreciated in the ED. urinalysis is not consistent with infection but confirms the presence of blood.  Bladder scan only showed 300 mL, but Foley catheter was placed  with immediate relief per report.  No intake and output has been recorded at this time.    Patient was alert, oriented, no distress on arrival.  He reported that all of his abdominal discomfort and nausea had subsided after placement of catheter.  Foley was draining medium red clear urine without clot material in tubing.  Reviewed case and plan with patient, his wife, and their caretaker.  All questions were answered to their satisfaction. ------------------  Assessment:   89 y.o. male with urinary retention   Recommendations: #urinary retention #gross hematuria #AoCKD # Hx of bladder neck contracture # S/p radical prostatectomy  Unsure how much urine was retained in bladder on arrival though he was reported to have been in notable discomfort and quite distended.  Foley catheter draining well.  Will remain in place until tomorrow morning.  If hematuria has not improved or has worsened, will exchange for three-way hematuria catheter and initiate CBI. No evidence of infection on urinalysis.  Trend labs, acute on chronic kidney disease.  Likely secondary to obstructive uropathy.  Case and plan discussed with Dr. Shane  Past Medical History: Past Medical History:  Diagnosis Date   Adenocarcinoma of left lung, stage 1 (HCC) 2001   T1N0 stage I  adenoca left lung resected 01/03/00    Alzheimer's disease (HCC)    CAD (coronary artery disease) 2014   a. 09/2012 Cath: LM nl, LAD 50-60p, D1 60-70 m, D1 50ost, LCX nl, RCA min irregs, EF 55-65%.   Generalized anxiety disorder 01/08/2007   Qualifier: Diagnosis of  By: [name], [name]     History of colonic polyps 07/25/2011   Macular degeneration of both eyes 01/21/2014  Nonmelanoma skin cancer 07/25/2011   Multiple lesions excised face/nose   Pericarditis    a. 09/2012 with effusion and tamponade, s/p window.  b.  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 SCC IN SITU TX CX3   SCC (squamous cell carcinoma) 04/12/2005   RIGHT NECK SUP. SCC IN SITU TX EXC   SCC (squamous cell carcinoma) 04/16/2013   LEFT TEMPLE SCC IN SITU TX CX3 5FU   SCC (squamous cell carcinoma) 04/16/2013   BELOW LEFT EYE SCC IN SITU TX CX3 5FU   SCC (squamous cell carcinoma) 04/16/2013   BELOW RIGHT EYE SCC IN SITU TX CX3 5FU   SCC (squamous cell carcinoma) 08/04/2014   LEFT CHEEK SCC IN SITU TX CX3 5FU   SCC (squamous cell carcinoma) 11/27/2018   LEFT TEMPLE SCC IN SITU TX WITH BX   SCC (squamous cell carcinoma)    SCC (squamous cell carcinoma)    SCC (squamous cell carcinoma)    SCC (squamous cell carcinoma) Well Diff 09/13/2016   Tip of Nose SCC WELL DIFF TX (MOH's), and RIGHT INNER EYE SUP. SCC IN SITU TX TO WATCH   SCC (squamous cell carcinoma) Well Diff 05/30/2017   Right Cheekbone (Cx3,5FU) and Under Left Eye (Cx3,5FU)   Squamous cell carcinoma in situ (SCCIS) 04/12/2005   Right Neck Inf (Cx3), Right Neck Sup (Exc), and Bulb of Nose (Cx3,Exc)   Squamous cell carcinoma in situ (SCCIS) 09/27/2005   Nose (Cx3,Exc)   Squamous cell carcinoma in situ (SCCIS) 02/18/2013   Below Left Eye (tx p bx)   Squamous cell carcinoma in situ (SCCIS) 04/16/2013   Left Temple (Cx3,5FU), Below Left Eye (Cx3,5FU), Below Right Eye (Cx3,5FU)   Squamous cell carcinoma in situ (SCCIS) 08/04/2014   Left Cheek (Cx3,5FU)   Squamous cell carcinoma in situ  (SCCIS) 09/13/2016   Right Inner Eye Sup. 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   Type 2 diabetes mellitus with vascular disease (HCC) 05/21/2007   Qualifier: Diagnosis of  By: [name], [name]     Type 2 DM with CKD stage 3 and hypertension (HCC) 07/16/2015   Unspecified essential hypertension     Past Surgical History:  Past Surgical History:  Procedure Laterality Date   HERNIA REPAIR     LEFT HEART CATHETERIZATION WITH CORONARY ANGIOGRAM N/A 09/13/2012   Procedure: LEFT HEART CATHETERIZATION WITH CORONARY ANGIOGRAM;  Surgeon: Ozell Fell,  Castillo;  Location: [facility] CATH LAB;  Service: Cardiovascular;  Laterality: N/A;   LOBECTOMY  2001   upper left   PERICARDIAL TAP N/A 09/16/2012   Procedure: PERICARDIAL TAP;  Surgeon: Victory LELON Claudene DOUGLAS, Castillo;  Location: [facility] CATH LAB;  Service: Cardiovascular;  Laterality: N/A;   PROSTATECTOMY  2003   SUBXYPHOID PERICARDIAL WINDOW N/A 09/16/2012   Procedure: SUBXYPHOID PERICARDIAL WINDOW;  Surgeon: Sudie VEAR Laine, Castillo;  Location: [facility] OR;  Service: Thoracic;  Laterality: N/A;   TONSILLECTOMY      Medication: No current facility-administered medications for this encounter.   Current Outpatient Medications  Medication Sig Dispense Refill   atorvastatin  (LIPITOR) 20 MG tablet TAKE 1 TABLET BY MOUTH EVERY DAY 90 tablet 0   Continuous Blood Gluc Receiver (FREESTYLE LIBRE 2 READER) DEVI Use to check blood sugar continous 1 each 0   Continuous Glucose Sensor (FREESTYLE LIBRE 2 SENSOR) MISC USE TO CHECK BLOOD SUGAR CONTINUOUS 2 each 11   donepezil  (ARICEPT ) 10 MG tablet TAKE 1 TABLET BY MOUTH EVERYDAY AT BEDTIME 90 tablet 3   ferrous sulfate  325 (65 FE) MG tablet TAKE 1 TABLET  BY MOUTH EVERY DAY WITH BREAKFAST 90 tablet 3   glipiZIDE  (GLUCOTROL ) 10 MG tablet Take 1 tablet (10 mg total) by mouth 2 (two) times daily before a meal. 180 tablet 0   OZEMPIC , 0.25 OR 0.5 MG/DOSE, 2 MG/3ML SOPN Inject 0.25 mg into the skin once a week.     pantoprazole  (PROTONIX ) 40 MG tablet TAKE 1 TABLET BY MOUTH EVERY DAY 90 tablet 3   Propylene Glycol (SYSTANE BALANCE) 0.6 % SOLN Apply 1 drop to eye 2 (two) times daily as needed (dry eyes).     sertraline  (ZOLOFT ) 100 MG tablet Take 1 tablet (100 mg total) by mouth daily. 90 tablet 1   Specialty Vitamins Products (ICAPS LUTEIN  & ZEAXANTHIN) TBEC Take 1 tablet by mouth 2 (two) times daily. 120 tablet 6   tamsulosin  (FLOMAX ) 0.4 MG CAPS capsule TAKE 1 CAPSULE BY MOUTH EVERY DAY AFTER SUPPER 90 capsule 3   triamcinolone  cream (KENALOG ) 0.1 % Apply 1 application topically 2 (two) times daily. 454 g 0    Allergies: Allergies  Allergen Reactions   Sulfa Antibiotics Nausea Only    Social History: Social History   Tobacco Use   Smoking status: Former    Current packs/day: 0.00    Average packs/day: 2.0 packs/day for 42.0 years (84.0 ttl pk-yrs)    Types: Cigarettes    Start date: 02/25/1953    Quit date: 04/04/1984    Years since quitting: 39.5   Smokeless tobacco: Former    Types: Chew    Quit date: 07/27/1986   Tobacco comments:    quit tobacco 26 years ago  Vaping Use   Vaping status: Never Used  Substance Use Topics   Alcohol use: No    Alcohol/week: 0.0 standard drinks of alcohol   Drug use: No    Family History Family History  Problem Relation Age of Onset   Cancer Father        colon   Dementia Mother    Diabetes Mother    Cancer Brother        lung    Review of Systems  Genitourinary:  Positive for hematuria. Negative for dysuria, flank pain, frequency and urgency.     Objective   Vital signs in last 24 hours: BP (!) 150/75 (BP Location: Left Arm)   Pulse (!) 102   Temp  98.2 F (36.8 C) (Oral)   Resp  18   Wt 78.9 kg   SpO2 99%   BMI 25.70 kg/m   Physical Exam General: A&O, resting, appropriate HEENT: [place]/AT Pulmonary: Normal work of breathing Cardiovascular: no cyanosis Abdomen: Soft, NTTP, nondistended GU: 37f Foley catheter in place draining clear red urine Neuro: Appropriate, no focal neurological deficits  Most Recent Labs: Lab Results  Component Value Date   WBC 7.7 10/10/2023   HGB 9.3 (Castillo) 10/10/2023   HCT 28.8 (Castillo) 10/10/2023   PLT 112 (Castillo) 10/10/2023    Lab Results  Component Value Date   NA 135 10/10/2023   K 5.0 10/10/2023   CL 102 10/10/2023   CO2 22 10/10/2023   BUN 40 (H) 10/10/2023   CREATININE 2.46 (H) 10/10/2023   CALCIUM  8.4 (Castillo) 10/10/2023   MG 2.0 03/11/2021    Lab Results  Component Value Date   INR 1.2 05/19/2021   APTT 37 (H) 05/19/2021     Urine Culture: @[id number] (laburin,org,[id number],[id number])@   IMAGING: No results found.  ------  Ole Bourdon, NP Pager: [phone]   Please contact the urology consult pager with any further questions/concerns.

## 2023-10-10 NOTE — ED Triage Notes (Signed)
 POV/ brought in by family/ last void was around 0200 this/ bright red blood present in toilet per wife/ sent by PCP/ pt appears very uncomfortable in triage/ A&Ox3

## 2023-10-10 NOTE — H&P (Signed)
 History and Physical  [name] FMW:[id number] DOB: June 11, 1930 DOA: 10/10/2023  PCP: Watt Mirza, MD   Chief Complaint: Gross hematuria  HPI: [name] is a 89 y.o. male with medical history significant for coronary artery disease, type 2 diabetes on Ozempic  and prostate cancer status post prostatectomy over a decade ago being admitted to the hospital with gross hematuria, urinary retention and associated acute kidney injury.  History is provided by the patient as well as his wife who is at the bedside, they state that he was in his usual state of health until he started having some vague nausea and lack of appetite about 3 days ago.  In the early morning hours yesterday, he noticed gross blood without clots in his urine, after which he was no longer able to urinate despite feeling that he had a full bladder.  He had some pain in the low pelvis radiating down into his penis.  He denies any dysuria, urgency or frequency prior to this, denies any chest pain, fevers, or vomiting.  Workup in the emergency department as detailed below shows evidence of urinary retention, acute kidney injury.  Foley catheter was placed and he has already been seen by urology consultant in the ER.  Review of Systems: Please see HPI for pertinent positives and negatives. A complete 10 system review of systems are otherwise negative.  Past Medical History:  Diagnosis Date   Adenocarcinoma of left lung, stage 1 (HCC) 2001   T1N0 stage I  adenoca left lung resected 01/03/00    Alzheimer's disease (HCC)    CAD (coronary artery disease) 2014   a. 09/2012 Cath: LM nl, LAD 50-60p, D1 60-70 m, D1 50ost, LCX nl, RCA min irregs, EF 55-65%.   Generalized anxiety disorder 01/08/2007   Qualifier: Diagnosis of  By: [name], [name]     History of colonic polyps 07/25/2011   Macular degeneration of both eyes 01/21/2014   Nonmelanoma skin cancer 07/25/2011   Multiple lesions excised face/nose   Pericarditis    a.  09/2012 with effusion and tamponade, s/p window.  b.  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 SCC IN SITU TX CX3   SCC (squamous cell carcinoma) 04/12/2005   RIGHT NECK SUP. SCC IN SITU TX EXC   SCC (squamous cell carcinoma) 04/16/2013   LEFT TEMPLE SCC IN SITU TX CX3 5FU   SCC (squamous cell carcinoma) 04/16/2013   BELOW LEFT EYE SCC IN SITU TX CX3 5FU   SCC (squamous cell carcinoma) 04/16/2013   BELOW RIGHT EYE SCC IN SITU TX CX3 5FU   SCC (squamous cell carcinoma) 08/04/2014   LEFT CHEEK SCC IN SITU TX CX3 5FU   SCC (squamous cell carcinoma) 11/27/2018  LEFT TEMPLE SCC IN SITU TX WITH BX   SCC (squamous cell carcinoma)    SCC (squamous cell carcinoma)    SCC (squamous cell carcinoma)    SCC (squamous cell carcinoma) Well Diff 09/13/2016   Tip of Nose SCC WELL DIFF TX (MOH's), and RIGHT INNER EYE SUP. SCC IN SITU TX TO WATCH   SCC (squamous cell carcinoma) Well Diff 05/30/2017   Right Cheekbone (Cx3,5FU) and Under Left Eye (Cx3,5FU)   Squamous cell carcinoma in situ (SCCIS) 04/12/2005   Right Neck Inf (Cx3), Right Neck Sup (Exc), and Bulb of Nose (Cx3,Exc)   Squamous cell carcinoma in situ (SCCIS) 09/27/2005   Nose (Cx3,Exc)   Squamous cell carcinoma in situ (SCCIS) 02/18/2013   Below Left Eye (tx p bx)   Squamous cell carcinoma in situ (SCCIS) 04/16/2013   Left  Temple (Cx3,5FU), Below Left Eye (Cx3,5FU), Below Right Eye (Cx3,5FU)   Squamous cell carcinoma in situ (SCCIS) 08/04/2014   Left Cheek (Cx3,5FU)   Squamous cell carcinoma in situ (SCCIS) 09/13/2016   Right Inner Eye Sup. 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   Type 2 diabetes mellitus with vascular disease (HCC) 05/21/2007   Qualifier: Diagnosis of  By: [name], [name]     Type 2 DM with CKD stage 3 and hypertension (HCC) 07/16/2015   Unspecified essential hypertension    Past Surgical History:  Procedure Laterality Date   HERNIA REPAIR     LEFT HEART CATHETERIZATION WITH CORONARY ANGIOGRAM N/A 09/13/2012   Procedure: LEFT HEART CATHETERIZATION WITH CORONARY ANGIOGRAM;  Surgeon: Ozell Fell, MD;  Location: [facility] CATH LAB;  Service: Cardiovascular;  Laterality: N/A;   LOBECTOMY  2001   upper left   PERICARDIAL TAP N/A 09/16/2012   Procedure: PERICARDIAL TAP;  Surgeon: Victory LELON Claudene DOUGLAS, MD;  Location: [facility] CATH LAB;  Service: Cardiovascular;  Laterality: N/A;   PROSTATECTOMY  2003   SUBXYPHOID PERICARDIAL WINDOW N/A 09/16/2012   Procedure: SUBXYPHOID PERICARDIAL WINDOW;  Surgeon: Sudie VEAR Laine, MD;  Location: [facility] OR;  Service: Thoracic;  Laterality: N/A;   TONSILLECTOMY     Social History:  reports that he quit smoking about 39 years ago. His smoking use included cigarettes. He started smoking about 70 years ago. He has a 84 pack-year smoking history. He quit smokeless tobacco use about 37 years ago.  His smokeless tobacco use included chew. He reports that he does not drink alcohol and does not use drugs.  Allergies  Allergen Reactions   Sulfa Antibiotics Nausea Only    Family History  Problem  Relation Age of Onset   Cancer Father        colon   Dementia Mother    Diabetes Mother    Cancer Brother        lung     Prior to Admission medications   Medication Sig Start Date End Date Taking? Authorizing Provider  atorvastatin  (LIPITOR) 20 MG tablet TAKE 1 TABLET BY MOUTH EVERY DAY 08/03/23   Copland, Jacques, MD  donepezil  (ARICEPT ) 10 MG tablet TAKE 1 TABLET BY MOUTH EVERYDAY AT BEDTIME Patient taking differently: Take 10 mg by mouth at bedtime. 12/09/22  Copland, Spencer, MD  ferrous sulfate  325 (65 FE) MG tablet TAKE 1 TABLET BY MOUTH EVERY DAY WITH BREAKFAST Patient taking differently: Take 325 mg by mouth daily with breakfast. 12/09/22   Copland, Jacques, MD  glipiZIDE  (GLUCOTROL ) 10 MG tablet Take 1 tablet (10 mg total) by mouth 2 (two) times daily before a meal. 08/14/23   Shamleffer, Donell Cardinal, MD  OZEMPIC , 0.25 OR 0.5 MG/DOSE, 2 MG/3ML SOPN Inject 0.25 mg into the skin once a week. 05/06/23   [provider]  pantoprazole  (PROTONIX ) 40 MG tablet TAKE 1 TABLET BY MOUTH EVERY DAY 12/09/22   Copland, Jacques, MD  Propylene Glycol (SYSTANE BALANCE) 0.6 % SOLN Apply 1 drop to eye 2 (two) times daily as needed (dry eyes).    [provider]  sertraline  (ZOLOFT ) 100 MG tablet Take 1 tablet (100 mg total) by mouth daily. 08/07/23   Copland, Jacques, MD  Specialty Vitamins Products (ICAPS LUTEIN  & ZEAXANTHIN) TBEC Take 1 tablet by mouth 2 (two) times daily. 04/25/22   Copland, Jacques, MD  tamsulosin  (FLOMAX ) 0.4 MG CAPS capsule TAKE 1 CAPSULE BY MOUTH EVERY DAY AFTER SUPPER Patient taking differently: Take 0.4 mg by mouth daily after supper. 12/09/22   Copland, Jacques, MD  triamcinolone  cream (KENALOG ) 0.1 % Apply 1 application topically 2 (two) times daily. 06/02/21   Watt Jacques, MD    Physical Exam: BP (!) 160/73   Pulse 76   Temp 98.2 F (36.8 C) (Oral)   Resp 18   Wt 78.9 kg   SpO2 97%   BMI 25.70 kg/m  General:  Alert, oriented, calm, in no acute  distress, his wife and a close family friend are at the bedside in the ER this afternoon Cardiovascular: RRR, no murmurs or rubs, no peripheral edema  Respiratory: clear to auscultation bilaterally, no wheezes, no crackles  Abdomen: soft, nontender, nondistended, normal bowel tones heard  Skin: dry, no rashes  GU: Foley catheter in place with clear light red urine without clots visible Musculoskeletal: no joint effusions, normal range of motion  Psychiatric: appropriate affect, normal speech  Neurologic: extraocular muscles intact, clear speech, moving all extremities with intact sensorium         Labs on Admission:  Basic Metabolic Panel: Recent Labs  Lab 10/10/23 1243  NA 135  K 5.0  CL 102  CO2 22  GLUCOSE 248*  BUN 40*  CREATININE 2.46*  CALCIUM  8.4*   Liver Function Tests: Recent Labs  Lab 10/10/23 1243  AST 12*  ALT 10  ALKPHOS 102  BILITOT 0.6  PROT 6.6  ALBUMIN 3.5   No results for input(s): LIPASE, AMYLASE in the last 168 hours. No results for input(s): AMMONIA in the last 168 hours. CBC: Recent Labs  Lab 10/10/23 1243  WBC 7.7  NEUTROABS 5.9  HGB 9.3*  HCT 28.8*  MCV 97.0  PLT 112*   Cardiac Enzymes: No results for input(s): CKTOTAL, CKMB, CKMBINDEX, TROPONINI in the last 168 hours. BNP (last 3 results) No results for input(s): BNP in the last 8760 hours.  ProBNP (last 3 results) No results for input(s): PROBNP in the last 8760 hours.  CBG: No results for input(s): GLUCAP in the last 168 hours.  Radiological Exams on Admission: No results found.  Assessment/Plan [name] is a 89 y.o. male with medical history significant for coronary artery disease, type 2 diabetes on Ozempic  and prostate cancer status post prostatectomy over a decade ago being admitted to the hospital with gross  hematuria, urinary retention and associated acute kidney injury.  Gross hematuria with urinary retention-with history of prior bladder  neck contracture, and radical prostatectomy.  No evidence of UTI. -Observation admission -Avoid all blood thinners -Continue Foley catheter, urology consultation appreciated  AKI on CKD-likely due to obstructive uropathy from urine retention -Avoid nephrotoxins and renally dose medications -Continuous IV fluids -Monitor renal function with daily labs  Type 2 diabetes-carb modified diet, with moderate dose sliding scale  DVT prophylaxis: SCDs only due to hematuria    Code Status: Full Code  Consults called: Urology  Admission status: Observation  Time spent: 48 minutes  Rogina Schiano CHRISTELLA Gail MD [facility] Pager [phone]  If 7PM-7AM, please contact night-coverage [email or web address] Password [email or web address]  10/10/2023, 3:20 PM

## 2023-10-10 NOTE — Assessment & Plan Note (Addendum)
 Had prostatectomy but no radiation Will try again to see if he can void Lots of blood Will need to go to ER--as will need catheterization  Recommended Darryle Law to be closer to the urologists Report given to [facility] triage nurse

## 2023-10-11 ENCOUNTER — Observation Stay (HOSPITAL_COMMUNITY)

## 2023-10-11 DIAGNOSIS — D649 Anemia, unspecified: Secondary | ICD-10-CM | POA: Diagnosis not present

## 2023-10-11 DIAGNOSIS — N179 Acute kidney failure, unspecified: Secondary | ICD-10-CM | POA: Diagnosis not present

## 2023-10-11 DIAGNOSIS — R339 Retention of urine, unspecified: Secondary | ICD-10-CM | POA: Diagnosis not present

## 2023-10-11 DIAGNOSIS — R310 Gross hematuria: Secondary | ICD-10-CM

## 2023-10-11 LAB — BASIC METABOLIC PANEL WITH GFR
Anion gap: 10 (ref 5–15)
BUN: 33 mg/dL — ABNORMAL HIGH (ref 8–23)
CO2: 23 mmol/L (ref 22–32)
Calcium: 8.4 mg/dL — ABNORMAL LOW (ref 8.9–10.3)
Chloride: 105 mmol/L (ref 98–111)
Creatinine, Ser: 1.68 mg/dL — ABNORMAL HIGH (ref 0.61–1.24)
GFR, Estimated: 38 mL/min — ABNORMAL LOW (ref >60)
Glucose, Bld: 117 mg/dL — ABNORMAL HIGH (ref 70–99)
Potassium: 4.6 mmol/L (ref 3.5–5.1)
Sodium: 138 mmol/L (ref 135–145)

## 2023-10-11 LAB — CBC
HCT: 26.9 % — ABNORMAL LOW (ref 39.0–52.0)
Hemoglobin: 8.7 g/dL — ABNORMAL LOW (ref 13.0–17.0)
MCH: 31.8 pg (ref 26.0–34.0)
MCHC: 32.3 g/dL (ref 30.0–36.0)
MCV: 98.2 fL (ref 80.0–100.0)
Platelets: 104 K/uL — ABNORMAL LOW (ref 150–400)
RBC: 2.74 MIL/uL — ABNORMAL LOW (ref 4.22–5.81)
RDW: 12.3 % (ref 11.5–15.5)
WBC: 6.5 K/uL (ref 4.0–10.5)
nRBC: 0 % (ref 0.0–0.2)

## 2023-10-11 LAB — GLUCOSE, CAPILLARY
Glucose-Capillary: 139 mg/dL — ABNORMAL HIGH (ref 70–99)
Glucose-Capillary: 218 mg/dL — ABNORMAL HIGH (ref 70–99)
Glucose-Capillary: 170 mg/dL — ABNORMAL HIGH (ref 70–99)
Glucose-Capillary: 246 mg/dL — ABNORMAL HIGH (ref 70–99)

## 2023-10-11 LAB — URINE CULTURE: Culture: NO GROWTH

## 2023-10-11 MED ORDER — LACTATED RINGERS IV SOLN: INTRAVENOUS | Status: AC

## 2023-10-11 NOTE — Progress Notes (Signed)
 [facility]                                                                               Kyvon Hu, is a 89 y.o. male, DOB - July 24, 1930, FMW:[id number] Admit date - 10/10/2023    Outpatient Primary MD for the patient is Copland, Jacques, MD  LOS - 0  days    Brief summary   [name] is a 89 y.o. male with medical history significant for coronary artery disease, type 2 diabetes on Ozempic  and prostate cancer status post prostatectomy over a decade ago being admitted to the hospital with gross hematuria, urinary retention and associated acute kidney injury.  History is provided by the patient as well as his wife who is at the bedside, they state that he was in his usual state of health until he started having some vague nausea and lack of appetite about 3 days ago.  In the early morning hours yesterday, he noticed gross blood without clots in his urine, after which he was no longer able to urinate despite feeling that he had a full bladder.  He had some pain in the low pelvis radiating down into his penis.  He denies any dysuria, urgency or frequency prior to this, denies any chest pain, fevers, or vomiting.  Workup in the emergency department as detailed below shows evidence of urinary retention, acute kidney injury.  Foley catheter was placed and he has already been seen by urology consultant in the ER.    Assessment & Plan    Assessment and Plan:  Gross hematuria and urinary retention History of prostate cancer with radical prostatectomy Urology consulted and patient is on CBI.  S/p Foley catheter with good urine output Ultrasound of the kidneys showed  5.8 x 5.6 x 6.4 cm complex, mostly solid partially cystic mass with patchy color flow in the superior pole of the right kidney. This is considered a renal cell carcinoma until proven otherwise No signs of infection so far    Type 2 diabetes CBG (last 3)  Recent Labs    10/10/23 2116 10/11/23 0731  10/11/23 1124  GLUCAP 143* 139* 218*   Resume SSI.    AKI Creatinine improving with IV fluids continue to monitor. Creatinine improved from 2.4-1.68   Anemia of chronic disease Superimposed anemia of blood loss from hematuria Transfuse  to keep it greater than 7.    Estimated body mass index is 25.7 kg/m as calculated from the following:   Height as of this encounter: 5' 9 (1.753 m).   Weight as of this encounter: 78.9 kg.  Code Status: Full code DVT Prophylaxis:  SCDs Start: 10/10/23 1519   Level of Care: Level of care: Med-Surg Family Communication: Updated patient's family at bedside  Disposition Plan:     Remains inpatient appropriate:  further evaluation for hematuria/ persistent hematuria  Procedures:  MRI abdomen  Consultants:   Urology.   Antimicrobials:   Anti-infectives (From admission, onward)    None        Medications  Scheduled Meds:  insulin  aspart  0-15 Units Subcutaneous TID WC   insulin  aspart  0-5 Units Subcutaneous QHS  Continuous Infusions:  lactated ringers  100 mL/hr at 10/11/23 1043   PRN Meds:.acetaminophen  **OR** acetaminophen , albuterol , ondansetron  **OR** ondansetron  (ZOFRAN ) IV, traZODone     Subjective:   [name] was seen and examined today.  No new complaints.   Objective:   Vitals:   10/11/23 0105 10/11/23 0639 10/11/23 0921 10/11/23 1411  BP: 129/62 132/62 130/66 (!) 147/77  Pulse: 68 64 73 85  Resp: 20 18 18 16   Temp: 98.1 F (36.7 C) 97.7 F (36.5 C) 98.1 F (36.7 C) 98.4 F (36.9 C)  TempSrc: Oral Oral Oral Oral  SpO2: 97% 99% 98% 97%  Weight:      Height:        Intake/Output Summary (Last 24 hours) at 10/11/2023 1436 Last data filed at 10/11/2023 1000 Gross per 24 hour  Intake 2423.07 ml  Output 2200 ml  Net 223.07 ml   Filed Weights   10/10/23 1118 10/10/23 1700  Weight: 78.9 kg 78.9 kg     Exam General: Alert and oriented x 3, NAD Cardiovascular: S1 S2 auscultated, no murmurs,  RRR Respiratory: Clear to auscultation bilaterally, no wheezing, rales or rhonchi Gastrointestinal: Soft, nontender, nondistended, + bowel sounds Ext: no pedal edema bilaterally Neuro: AAOx3, Cr N's II- XII Skin: No rashes Psych: Normal affect and demeanor,    Data Reviewed:  I have personally reviewed following labs and imaging studies   CBC Lab Results  Component Value Date   WBC 6.5 10/11/2023   RBC 2.74 (L) 10/11/2023   HGB 8.7 (L) 10/11/2023   HCT 26.9 (L) 10/11/2023   MCV 98.2 10/11/2023   MCH 31.8 10/11/2023   PLT 104 (L) 10/11/2023   MCHC 32.3 10/11/2023   RDW 12.3 10/11/2023   LYMPHSABS 0.2 (L) 10/10/2023   MONOABS 1.0 10/10/2023   EOSABS 0.0 10/10/2023   BASOSABS 0.0 10/10/2023     Last metabolic panel Lab Results  Component Value Date   NA 138 10/11/2023   K 4.6 10/11/2023   CL 105 10/11/2023   CO2 23 10/11/2023   BUN 33 (H) 10/11/2023   CREATININE 1.68 (H) 10/11/2023   GLUCOSE 117 (H) 10/11/2023   GFRNONAA 38 (L) 10/11/2023   GFRAA 58 (L) 06/25/2018   CALCIUM  8.4 (L) 10/11/2023   PROT 6.6 10/10/2023   ALBUMIN 3.5 10/10/2023   BILITOT 0.6 10/10/2023   ALKPHOS 102 10/10/2023   AST 12 (L) 10/10/2023   ALT 10 10/10/2023   ANIONGAP 10 10/11/2023    CBG (last 3)  Recent Labs    10/10/23 2116 10/11/23 0731 10/11/23 1124  GLUCAP 143* 139* 218*      Coagulation Profile: No results for input(s): INR, PROTIME in the last 168 hours.   Radiology Studies: US  RENAL Result Date: 10/10/2023 CLINICAL DATA:  230144.  Evaluation for gross hematuria. EXAM: RENAL / URINARY TRACT ULTRASOUND COMPLETE COMPARISON:  No prior ultrasound. The last CT that included the area was with contrast dated 05/18/2020. FINDINGS: Right Kidney: Renal measurements: 12.8 x 6.9 x 7.7 cm = volume: 354.5 mL. There is a complex, mostly solid partially cystic mass with patchy color flow in the superior pole measuring 5.8 x 5.6 x 6.4 cm. This is considered a renal cell carcinoma  until proven otherwise. In 2022 there was an indeterminate 2.3 cm solid lesion in this location which was recommended for MRI follow-up, which if performed was not done in our system. Rest of the cortical echogenicity appears normal. There are several other simple cysts in this kidney in the  mid to lower pole. Largest measured is 3 cm exophytic from the lower pole. No intrarenal stones or hydronephrosis are seen. Left Kidney: Renal measurements: 10.8 x 5.7 x 5.8 cm = volume: 185.9 1 mL. The cortical echogenicity of this kidney is increased. Again noted is a 3.2 cm simple cyst arising from the inferior pole, and an adjacent 1.5 cm parapelvic simple cyst. No solid lesion is suspected. No hydronephrosis is seen. In the mid pole of this kidney there is either a 3 mm nonobstructive caliceal stone or renal sinus fat mimicking a stone. There are no other visible stones. Bladder: Catheterized, contracted and obscured by bowel gas. Other: None. IMPRESSION: 1. 5.8 x 5.6 x 6.4 cm complex, mostly solid partially cystic mass with patchy color flow in the superior pole of the right kidney. This is considered a renal cell carcinoma until proven otherwise. MRI or CT with contrast follow-up is recommended. 2. Bilateral simple renal cysts. 3. 3 mm nonobstructive caliceal stone versus renal sinus fat mimicking a stone in the mid pole of the left kidney. 4. Increased cortical echogenicity of the left kidney which can be seen with medical renal disease. 5. Bladder catheterized, contracted and obscured by bowel gas. Electronically Signed   By: Francis Quam M.D.   On: 10/10/2023 21:21       Elgie Butter M.D. [facility] 10/11/2023, 2:36 PM  Available via Epic secure chat 7am-7pm After 7 pm, please refer to night coverage provider listed on amion.

## 2023-10-11 NOTE — Care Management Obs Status (Signed)
 MEDICARE OBSERVATION STATUS NOTIFICATION   Patient Details  Name: [name] MRN: [id number] Date of Birth: 1931-01-09   Medicare Observation Status Notification Given:  Yes    NORMAN ASPEN, LCSW 10/11/2023, 1:51 PM

## 2023-10-11 NOTE — Progress Notes (Addendum)
 Subjective: Pt resting comfortably in bed. Wife and caretaker were present. Wife was emotional and worried. Oldest son is undergoing a lymph node dissection for lymphoma  Objective: Vital signs in last 24 hours: Temp:  [97.7 F (36.5 C)-98.5 F (36.9 C)] 98.1 F (36.7 C) (07/09 0921) Pulse Rate:  [64-91] 73 (07/09 0921) Resp:  [18-20] 18 (07/09 0921) BP: (128-170)/(62-76) 130/66 (07/09 0921) SpO2:  [95 %-100 %] 98 % (07/09 0921) Weight:  [78.9 kg] 78.9 kg (07/08 1700)  Assessment/Plan: #urinary retention #gross hematuria #AoCKD # Hx of bladder neck contracture # S/p radical prostatectomy   Foley in place and draining well. Good UOP.   No evidence of infection on urinalysis.  Hand irrigation as needed.    Trend labs, SCr improving. 2.46-->1.68  Renal US  shows 5.8 x 5.6 x 6.4 right superior pole mass assumed to be RCC. MRI pending    Intake/Output from previous day: 07/08 0701 - 07/09 0700 In: 2183.1 [P.O.:600; I.V.:1583.1] Out: 2450 [Urine:2450]  Intake/Output this shift: Total I/O In: 240 [P.O.:240] Out: 300 [Urine:300]  Physical Exam:  General: Alert and oriented CV: No cyanosis Lungs: equal chest rise Gu: foley in place draining clear pink urine.   Lab Results: Recent Labs    10/10/23 1243 10/11/23 0442  HGB 9.3* 8.7*  HCT 28.8* 26.9*   BMET Recent Labs    10/10/23 1243 10/11/23 0442  NA 135 138  K 5.0 4.6  CL 102 105  CO2 22 23  GLUCOSE 248* 117*  BUN 40* 33*  CREATININE 2.46* 1.68*  CALCIUM  8.4* 8.4*  HGB 9.3* 8.7*  WBC 7.7 6.5     Studies/Results: US  RENAL Result Date: 10/10/2023 CLINICAL DATA:  [id number].  Evaluation for gross hematuria. EXAM: RENAL / URINARY TRACT ULTRASOUND COMPLETE COMPARISON:  No prior ultrasound. The last CT that included the area was with contrast dated 05/18/2020. FINDINGS: Right Kidney: Renal measurements: 12.8 x 6.9 x 7.7 cm = volume: 354.5 mL. There is a complex, mostly solid partially cystic mass with  patchy color flow in the superior pole measuring 5.8 x 5.6 x 6.4 cm. This is considered a renal cell carcinoma until proven otherwise. In 2022 there was an indeterminate 2.3 cm solid lesion in this location which was recommended for MRI follow-up, which if performed was not done in our system. Rest of the cortical echogenicity appears normal. There are several other simple cysts in this kidney in the mid to lower pole. Largest measured is 3 cm exophytic from the lower pole. No intrarenal stones or hydronephrosis are seen. Left Kidney: Renal measurements: 10.8 x 5.7 x 5.8 cm = volume: 185.9 1 mL. The cortical echogenicity of this kidney is increased. Again noted is a 3.2 cm simple cyst arising from the inferior pole, and an adjacent 1.5 cm parapelvic simple cyst. No solid lesion is suspected. No hydronephrosis is seen. In the mid pole of this kidney there is either a 3 mm nonobstructive caliceal stone or renal sinus fat mimicking a stone. There are no other visible stones. Bladder: Catheterized, contracted and obscured by bowel gas. Other: None. IMPRESSION: 1. 5.8 x 5.6 x 6.4 cm complex, mostly solid partially cystic mass with patchy color flow in the superior pole of the right kidney. This is considered a renal cell carcinoma until proven otherwise. MRI or CT with contrast follow-up is recommended. 2. Bilateral simple renal cysts. 3. 3 mm nonobstructive caliceal stone versus renal sinus fat mimicking a stone in the mid pole of  the left kidney. 4. Increased cortical echogenicity of the left kidney which can be seen with medical renal disease. 5. Bladder catheterized, contracted and obscured by bowel gas. Electronically Signed   By: Francis Quam M.D.   On: 10/10/2023 21:21      LOS: 0 days   Ole Bourdon, NP [facility] Pager: [phone]  10/11/2023, 11:20 AM

## 2023-10-11 NOTE — Evaluation (Signed)
 Occupational Therapy Evaluation Patient Details Name: [name] MRN: [id number] DOB: Aug 05, 1930 Today's Date: 10/11/2023   History of Present Illness   [name] is a 89 yr old male brought to the hospital with hematuria and urinary retention. He was found to have AKI.  PMH: CAD, DM II, prostate CA s/p prostatectomy, lung CA s/p recection in 2001, Alzheimer's disease, macular degeneration, skin CA, CVA     Clinical Impressions The pt is currently presenting with the below listed deficits (see OT problem list). During the session, he required min assist for lower body dressing, CGA to stand using a RW, and steadying assist for short distance ambulation using a RW. He reported a history of CVA, causing LLE weakness. He also reported having a fall a couple weeks ago, with resultant and ongoing L flank discomfort. OT anticipates he will do well from receiving OT services in the hospital setting. Home health OT is recommended.      If plan is discharge home, recommend the following:   Assistance with cooking/housework;Assist for transportation;Help with stairs or ramp for entrance     Functional Status Assessment   Patient has had a recent decline in their functional status and demonstrates the ability to make significant improvements in function in a reasonable and predictable amount of time.     Equipment Recommendations   None recommended by OT     Recommendations for Other Services         Precautions/Restrictions   Precautions Precautions: Fall Restrictions Weight Bearing Restrictions Per Provider Order: No Other Position/Activity Restrictions: pt reports a history of CVA with resultant LLE weakness     Mobility Bed Mobility Overal bed mobility: Needs Assistance Bed Mobility: Supine to Sit, Sit to Supine     Supine to sit: Supervision Sit to supine: Supervision        Transfers Overall transfer level: Needs assistance Equipment used: Rolling  walker (2 wheels) Transfers: Sit to/from Stand Sit to Stand: Contact guard assist                  Balance     Sitting balance-Leahy Scale: Good         Standing balance comment: CGA with rolling walker             ADL either performed or assessed with clinical judgement   ADL Overall ADL's : Needs assistance/impaired Eating/Feeding: Set up;Sitting   Grooming: Set up;Sitting           Upper Body Dressing : Set up;Sitting   Lower Body Dressing: Minimal assistance;Sitting/lateral leans   Toilet Transfer: Contact guard assist;Rolling walker (2 wheels);Ambulation Toilet Transfer Details (indicate cue type and reason): at bathroom level, based on clinical judgement                 Vision Baseline Vision/History: 6 Macular Degeneration              Pertinent Vitals/Pain Pain Assessment Pain Assessment: No/denies pain     Extremity/Trunk Assessment Upper Extremity Assessment Upper Extremity Assessment: RUE deficits/detail;Right hand dominant;LUE deficits/detail RUE Deficits / Details: AROM WFL. Functional grip strength LUE Deficits / Details: AROM WFL. Functional grip strength   Lower Extremity Assessment Lower Extremity Assessment: LLE deficits/detail;RLE deficits/detail RLE Deficits / Details: AROM WFL LLE Deficits / Details: AROM WFL. He reported a history of CVA causing LLE weakness       Communication Communication Factors Affecting Communication: Hearing impaired   Cognition Arousal: Alert   Cognition: History of  cognitive impairments             OT - Cognition Comments: Oriented x4, able to follow 1 step commands consistently, occasional difficulty with memory/recall, his medical chart indicates a history of Alzheimer's disease                 Following commands: Intact                  Home Living Family/patient expects to be discharged to:: Private residence Living Arrangements: Spouse/significant  other Available Help at Discharge: Family Type of Home: House Home Access: Ramped entrance     Home Layout: One level     Bathroom Shower/Tub: Walk-in shower         Home Equipment: Agricultural consultant (2 wheels);Hospital bed;Shower seat;Rollator (4 wheels)   Additional Comments: They have a live-in caregiver who assists them with household chores, including cleaning and cooking.      Prior Functioning/Environment Prior Level of Function : Independent/Modified Independent             Mobility Comments:  (He used a rollator for household ambulation.) ADLs Comments:  (The pt was modified independent to independent with ADLs.)    OT Problem List: Decreased strength;Impaired balance (sitting and/or standing);Impaired vision/perception   OT Treatment/Interventions: Self-care/ADL training;Therapeutic exercise;Therapeutic activities;Energy conservation;Patient/family education;DME and/or AE instruction;Balance training      OT Goals(Current goals can be found in the care plan section)   Acute Rehab OT Goals OT Goal Formulation: With patient Time For Goal Achievement: 10/25/23 Potential to Achieve Goals: Good ADL Goals Pt Will Perform Grooming: with supervision;standing Pt Will Perform Lower Body Dressing: with supervision;sit to/from stand;sitting/lateral leans Pt Will Transfer to Toilet: with supervision;ambulating Pt Will Perform Toileting - Clothing Manipulation and hygiene: with supervision;sit to/from stand   OT Frequency:  Min 2X/week       AM-PAC OT 6 Clicks Daily Activity     Outcome Measure Help from another person eating meals?: A Little Help from another person taking care of personal grooming?: A Little Help from another person toileting, which includes using toliet, bedpan, or urinal?: A Little Help from another person bathing (including washing, rinsing, drying)?: A Little Help from another person to put on and taking off regular upper body clothing?: A  Little Help from another person to put on and taking off regular lower body clothing?: A Little 6 Click Score: 18   End of Session Equipment Utilized During Treatment: Gait belt;Rolling walker (2 wheels) Nurse Communication: Mobility status  Activity Tolerance: Patient tolerated treatment well Patient left: in bed;with call bell/phone within reach;with bed alarm set;with family/visitor present  OT Visit Diagnosis: Unsteadiness on feet (R26.81);Muscle weakness (generalized) (M62.81)                Time: 8494-8477 OT Time Calculation (min): 17 min Charges:  OT General Charges $OT Visit: 1 Visit OT Evaluation $OT Eval Moderate Complexity: 1 Mod    Jaycob Mcclenton L Laylaa Guevarra, OTR/L 10/11/2023, 5:34 PM

## 2023-10-12 DIAGNOSIS — R339 Retention of urine, unspecified: Secondary | ICD-10-CM | POA: Diagnosis not present

## 2023-10-12 DIAGNOSIS — Z515 Encounter for palliative care: Secondary | ICD-10-CM | POA: Diagnosis not present

## 2023-10-12 DIAGNOSIS — D649 Anemia, unspecified: Secondary | ICD-10-CM | POA: Diagnosis not present

## 2023-10-12 DIAGNOSIS — R531 Weakness: Secondary | ICD-10-CM

## 2023-10-12 DIAGNOSIS — N179 Acute kidney failure, unspecified: Secondary | ICD-10-CM | POA: Diagnosis not present

## 2023-10-12 DIAGNOSIS — Z7189 Other specified counseling: Secondary | ICD-10-CM | POA: Diagnosis not present

## 2023-10-12 DIAGNOSIS — R310 Gross hematuria: Secondary | ICD-10-CM | POA: Diagnosis not present

## 2023-10-12 LAB — CBC WITH DIFFERENTIAL/PLATELET
Abs Immature Granulocytes: 0.38 K/uL — ABNORMAL HIGH (ref 0.00–0.07)
Basophils Absolute: 0 K/uL (ref 0.0–0.1)
Basophils Relative: 0 %
Eosinophils Absolute: 0 K/uL (ref 0.0–0.5)
Eosinophils Relative: 1 %
HCT: 27.9 % — ABNORMAL LOW (ref 39.0–52.0)
Hemoglobin: 8.6 g/dL — ABNORMAL LOW (ref 13.0–17.0)
Immature Granulocytes: 6 %
Lymphocytes Relative: 6 %
Lymphs Abs: 0.4 K/uL — ABNORMAL LOW (ref 0.7–4.0)
MCH: 30.6 pg (ref 26.0–34.0)
MCHC: 30.8 g/dL (ref 30.0–36.0)
MCV: 99.3 fL (ref 80.0–100.0)
Monocytes Absolute: 1.5 K/uL — ABNORMAL HIGH (ref 0.1–1.0)
Monocytes Relative: 23 %
Neutro Abs: 4.1 K/uL (ref 1.7–7.7)
Neutrophils Relative %: 64 %
Platelets: 115 K/uL — ABNORMAL LOW (ref 150–400)
RBC: 2.81 MIL/uL — ABNORMAL LOW (ref 4.22–5.81)
RDW: 12.2 % (ref 11.5–15.5)
WBC: 6.4 K/uL (ref 4.0–10.5)
nRBC: 0 % (ref 0.0–0.2)

## 2023-10-12 LAB — GLUCOSE, CAPILLARY
Glucose-Capillary: 141 mg/dL — ABNORMAL HIGH (ref 70–99)
Glucose-Capillary: 207 mg/dL — ABNORMAL HIGH (ref 70–99)
Glucose-Capillary: 292 mg/dL — ABNORMAL HIGH (ref 70–99)
Glucose-Capillary: 61 mg/dL — ABNORMAL LOW (ref 70–99)
Glucose-Capillary: 149 mg/dL — ABNORMAL HIGH (ref 70–99)

## 2023-10-12 LAB — BASIC METABOLIC PANEL WITH GFR
Anion gap: 8 (ref 5–15)
BUN: 31 mg/dL — ABNORMAL HIGH (ref 8–23)
CO2: 23 mmol/L (ref 22–32)
Calcium: 8.3 mg/dL — ABNORMAL LOW (ref 8.9–10.3)
Chloride: 106 mmol/L (ref 98–111)
Creatinine, Ser: 2.14 mg/dL — ABNORMAL HIGH (ref 0.61–1.24)
GFR, Estimated: 29 mL/min — ABNORMAL LOW (ref >60)
Glucose, Bld: 269 mg/dL — ABNORMAL HIGH (ref 70–99)
Potassium: 5.2 mmol/L — ABNORMAL HIGH (ref 3.5–5.1)
Sodium: 137 mmol/L (ref 135–145)

## 2023-10-12 MED ORDER — SERTRALINE HCL 100 MG PO TABS
100.0000 mg | ORAL_TABLET | Freq: Every day | ORAL | Status: DC
  Administered 2023-10-12 – 2023-10-14 (×3): 100 mg via ORAL
  Filled 2023-10-12 (×3): qty 1

## 2023-10-12 MED ORDER — DONEPEZIL HCL 10 MG PO TABS
10.0000 mg | ORAL_TABLET | Freq: Every day | ORAL | Status: DC
  Administered 2023-10-12 – 2023-10-13 (×2): 10 mg via ORAL
  Filled 2023-10-12 (×2): qty 1

## 2023-10-12 MED ORDER — PANTOPRAZOLE SODIUM 40 MG PO TBEC
40.0000 mg | DELAYED_RELEASE_TABLET | Freq: Every day | ORAL | Status: DC
  Administered 2023-10-12 – 2023-10-14 (×3): 40 mg via ORAL
  Filled 2023-10-12 (×3): qty 1

## 2023-10-12 MED ORDER — OXYCODONE HCL 5 MG PO TABS
5.0000 mg | ORAL_TABLET | ORAL | Status: DC | PRN
  Filled 2023-10-12: qty 1

## 2023-10-12 MED ORDER — ATORVASTATIN CALCIUM 20 MG PO TABS
20.0000 mg | ORAL_TABLET | Freq: Every day | ORAL | Status: DC
  Administered 2023-10-12 – 2023-10-14 (×3): 20 mg via ORAL
  Filled 2023-10-12 (×3): qty 1

## 2023-10-12 MED ORDER — CHLORHEXIDINE GLUCONATE CLOTH 2 % EX PADS
6.0000 | MEDICATED_PAD | Freq: Every day | CUTANEOUS | Status: DC
  Administered 2023-10-12 – 2023-10-13 (×2): 6 via TOPICAL

## 2023-10-12 MED ORDER — GLIPIZIDE 10 MG PO TABS
10.0000 mg | ORAL_TABLET | Freq: Two times a day (BID) | ORAL | Status: DC
  Administered 2023-10-12 – 2023-10-13 (×2): 10 mg via ORAL
  Filled 2023-10-12 (×2): qty 1

## 2023-10-12 MED ORDER — OXYBUTYNIN CHLORIDE 5 MG PO TABS
2.5000 mg | ORAL_TABLET | Freq: Two times a day (BID) | ORAL | Status: DC
  Administered 2023-10-12 – 2023-10-14 (×5): 2.5 mg via ORAL
  Filled 2023-10-12 (×5): qty 1

## 2023-10-12 NOTE — Progress Notes (Signed)
 Subjective: Doing well laying in bed, catheter still in place some clots urine cool aid colored   Objective: Vital signs in last 24 hours: Temp:  [97.5 F (36.4 C)-98.3 F (36.8 C)] 97.8 F (36.6 C) (07/10 1309) Pulse Rate:  [70-91] 91 (07/10 1309) Resp:  [18-20] 20 (07/10 1309) BP: (152-164)/(68-72) 159/72 (07/10 1309) SpO2:  [96 %-99 %] 99 % (07/10 1309)  Intake/Output from previous day: 07/09 0701 - 07/10 0700 In: 2711.6 [P.O.:1200; I.V.:1511.6] Out: 1700 [Urine:1700] Intake/Output this shift: Total I/O In: 1080 [P.O.:1080] Out: 900 [Urine:900]  Physical Exam:  General: Alert and oriented CV: RRR Lungs: Clear GU: urine cool aid colored some somall clots   Lab Results: Recent Labs    10/10/23 1243 10/11/23 0442 10/12/23 1534  HGB 9.3* 8.7* 8.6*  HCT 28.8* 26.9* 27.9*   BMET Recent Labs    10/11/23 0442 10/12/23 1534  NA 138 137  K 4.6 5.2*  CL 105 106  CO2 23 23  GLUCOSE 117* 269*  BUN 33* 31*  CREATININE 1.68* 2.14*  CALCIUM  8.4* 8.3*     Studies/Results: MR ABDOMEN WO CONTRAST Result Date: 10/11/2023 CLINICAL DATA:  Kidney cancer staging, metastatic disease evaluation EXAM: MRI ABDOMEN WITHOUT CONTRAST TECHNIQUE: Multiplanar multisequence MR imaging was performed without the administration of intravenous contrast. COMPARISON:  Renal ultrasound, 10/10/2023 FINDINGS: Lower chest: No acute abnormality. Numerous small pulmonary nodules throughout the lung bases. Hepatobiliary: No obvious solid liver abnormality is seen. Fluid signal cysts and/or hemangiomata of the liver. Gallstones. Gallbladder wall thickening, or biliary dilatation. Pancreas: Unremarkable. No pancreatic ductal dilatation or surrounding inflammatory changes. Spleen: Splenomegaly, maximum span 14.9 cm. Adrenals/Urinary Tract: Adrenal glands are unremarkable. Large mass of the midportion of the right kidney with infiltration into the renal pelvis and renal vein, measuring at least 8.3 x 7.9 x  6.9 cm (series 4, image 29, series 3, image 16). Simple, benign left renal cortical cysts, for which no specific follow-up or characterization is required. No obvious left renal mass. No obvious calculi or hydronephrosis Stomach/Bowel: Stomach is within normal limits. No evidence of bowel wall thickening, distention, or inflammatory changes. Pancolonic diverticulosis Vascular/Lymphatic: Right renal vein invasion (series 4, image 23). Enlarged right retrocaval lymph node measuring 1.9 x 1.8 cm (series 4, image 21). Other: No abdominal wall hernia or abnormality. No ascites. Musculoskeletal: No acute osseous findings. T2 hyperintense, diffusion restricting body lesions, not clearly characterized by noncontrast MR, for example of T10 (series 4, image 8) IMPRESSION: 1. Large mass of the midportion of the right kidney with infiltration into the renal pelvis and renal vein, measuring at least 8.3 x 7.9 x 6.9 cm. 2. Enlarged right retrocaval lymph node. 3. T2 hyperintense, diffusion restricting body lesions, not clearly characterized by noncontrast MR, but suspicious for vertebral body metastases. Contrast enhanced MRI of the spine would be helpful for more clear assessment. 4. Numerous small nodules throughout the lung bases, presumed pulmonary metastases. 5. Cholelithiasis. Electronically Signed   By: Marolyn JONETTA Jaksch M.D.   On: 10/11/2023 20:11   US  RENAL Result Date: 10/10/2023 CLINICAL DATA:  [id number].  Evaluation for gross hematuria. EXAM: RENAL / URINARY TRACT ULTRASOUND COMPLETE COMPARISON:  No prior ultrasound. The last CT that included the area was with contrast dated 05/18/2020. FINDINGS: Right Kidney: Renal measurements: 12.8 x 6.9 x 7.7 cm = volume: 354.5 mL. There is a complex, mostly solid partially cystic mass with patchy color flow in the superior pole measuring 5.8 x 5.6 x 6.4 cm. This is considered a  renal cell carcinoma until proven otherwise. In 2022 there was an indeterminate 2.3 cm solid lesion in this  location which was recommended for MRI follow-up, which if performed was not done in our system. Rest of the cortical echogenicity appears normal. There are several other simple cysts in this kidney in the mid to lower pole. Largest measured is 3 cm exophytic from the lower pole. No intrarenal stones or hydronephrosis are seen. Left Kidney: Renal measurements: 10.8 x 5.7 x 5.8 cm = volume: 185.9 1 mL. The cortical echogenicity of this kidney is increased. Again noted is a 3.2 cm simple cyst arising from the inferior pole, and an adjacent 1.5 cm parapelvic simple cyst. No solid lesion is suspected. No hydronephrosis is seen. In the mid pole of this kidney there is either a 3 mm nonobstructive caliceal stone or renal sinus fat mimicking a stone. There are no other visible stones. Bladder: Catheterized, contracted and obscured by bowel gas. Other: None. IMPRESSION: 1. 5.8 x 5.6 x 6.4 cm complex, mostly solid partially cystic mass with patchy color flow in the superior pole of the right kidney. This is considered a renal cell carcinoma until proven otherwise. MRI or CT with contrast follow-up is recommended. 2. Bilateral simple renal cysts. 3. 3 mm nonobstructive caliceal stone versus renal sinus fat mimicking a stone in the mid pole of the left kidney. 4. Increased cortical echogenicity of the left kidney which can be seen with medical renal disease. 5. Bladder catheterized, contracted and obscured by bowel gas. Electronically Signed   By: Francis Quam M.D.   On: 10/10/2023 21:21    Assessment/Plan: 26 M w/ R renal mass concerning for metastasis and gross hematuria.   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 Patient has  already chose to pursue hospice and dies not want the risks or the side effects of immunotherapy or surgery. He does not need f/u with urology unless he is unable to urinate.    LOS: 0 days   Jackey Pea MD 10/12/2023, 6:24 PM [facility]

## 2023-10-12 NOTE — Progress Notes (Signed)
 [facility]                                                                               Nitin Mckowen, is a 89 y.o. male, DOB - Jan 08, 1931, FMW:[id number] Admit date - 10/10/2023    Outpatient Primary MD for the patient is Copland, Jacques, MD  LOS - 0  days    Brief summary   [name] is a 89 y.o. male with medical history significant for coronary artery disease, type 2 diabetes on Ozempic  and prostate cancer status post prostatectomy over a decade ago being admitted to the hospital with gross hematuria, urinary retention and associated acute kidney injury.  History is provided by the patient as well as his wife who is at the bedside, they state that he was in his usual state of health until he started having some vague nausea and lack of appetite about 3 days ago.  In the early morning hours yesterday, he noticed gross blood without clots in his urine, after which he was no longer able to urinate despite feeling that he had a full bladder.  He had some pain in the low pelvis radiating down into his penis.  He denies any dysuria, urgency or frequency prior to this, denies any chest pain, fevers, or vomiting.  Workup in the emergency department as detailed below shows evidence of urinary retention, acute kidney injury.  Foley catheter was placed and he has already been seen by urology consultant in the ER.    Assessment & Plan    Assessment and Plan:  Gross hematuria and urinary retention History of prostate cancer with radical prostatectomy Urology consulted and patient is on CBI.  S/p Foley catheter with good urine output.  Ultrasound of the kidneys showed  5.8 x 5.6 x 6.4 cm complex, mostly solid partially cystic mass with patchy color flow in the superior pole of the right kidney. This is considered a renal cell carcinoma until proven otherwise No signs of infection so far. MRI abd done showing Large mass of the midportion of the right kidney with infiltration  into the renal pelvis and renal vein, measuring at least 8.3 x 7.9 x 6.9 cm.  Discussed the results with the patient and his family at bedside.  Awaiting urology recommendations.      Type 2 diabetes Mellitus.  CBG (last 3)  Recent Labs    10/11/23 2152 10/12/23 0719 10/12/23 1117  GLUCAP 246* 141* 207*   Resume SSI. Get A1c . Added on Glipizide  10 mg BID.    AKI Creatinine improving with IV fluids continue to monitor. Creatinine improved from 2.4-1.68 Repeat BMP today.    Anemia of chronic disease Superimposed anemia of blood loss from hematuria Transfuse  to keep it greater than 7. Check cbc today.   Hyperlipidemia Continue with lipitor.    Dementia Continue with aricept .      Estimated body mass index is 25.7 kg/m as calculated from the following:   Height as of this encounter: 5' 9 (1.753 m).   Weight as of this encounter: 78.9 kg.  Code Status: DNR  DVT Prophylaxis:  SCDs Start: 10/10/23 1519   Level of  Care: Level of care: Med-Surg Family Communication: Updated patient's family at bedside  Disposition Plan:     Remains inpatient appropriate:  further evaluation for hematuria/ persistent hematuria  Procedures:  MRI abdomen  Consultants:   Urology.   Antimicrobials:   Anti-infectives (From admission, onward)    None        Medications  Scheduled Meds:  Chlorhexidine  Gluconate Cloth  6 each Topical Daily   insulin  aspart  0-15 Units Subcutaneous TID WC   insulin  aspart  0-5 Units Subcutaneous QHS   oxybutynin   2.5 mg Oral BID   Continuous Infusions:   PRN Meds:.acetaminophen  **OR** acetaminophen , albuterol , ondansetron  **OR** ondansetron  (ZOFRAN ) IV, oxyCODONE , traZODone     Subjective:   [name] was seen and examined today.  Some spasms of the bladder . Persistent hematuria.   Objective:   Vitals:   10/11/23 1857 10/11/23 2053 10/12/23 0624 10/12/23 1309  BP: (!) 152/68 (!) 164/72 (!) 152/68 (!) 159/72  Pulse:  75 74 70 91  Resp: 18 18 19 20   Temp: 98.2 F (36.8 C) 98.3 F (36.8 C) (!) 97.5 F (36.4 C) 97.8 F (36.6 C)  TempSrc: Oral Oral Oral Oral  SpO2: 96% 98% 98% 99%  Weight:      Height:        Intake/Output Summary (Last 24 hours) at 10/12/2023 1425 Last data filed at 10/12/2023 1315 Gross per 24 hour  Intake 3311.6 ml  Output 1650 ml  Net 1661.6 ml   Filed Weights   10/10/23 1118 10/10/23 1700  Weight: 78.9 kg 78.9 kg     Exam General exam: Appears calm and comfortable  Respiratory system: Clear to auscultation. Respiratory effort normal. Cardiovascular system: S1 & S2 heard, RRR. No JVD,  Gastrointestinal system: Abdomen is nondistended, soft and nontender. Central nervous system: Alert and oriented to person and place,  Extremities: Symmetric 5 x 5 power. Skin: No rashes,  Psychiatry: flat affect, mood is appropriate    Data Reviewed:  I have personally reviewed following labs and imaging studies   CBC Lab Results  Component Value Date   WBC 6.5 10/11/2023   RBC 2.74 (L) 10/11/2023   HGB 8.7 (L) 10/11/2023   HCT 26.9 (L) 10/11/2023   MCV 98.2 10/11/2023   MCH 31.8 10/11/2023   PLT 104 (L) 10/11/2023   MCHC 32.3 10/11/2023   RDW 12.3 10/11/2023   LYMPHSABS 0.2 (L) 10/10/2023   MONOABS 1.0 10/10/2023   EOSABS 0.0 10/10/2023   BASOSABS 0.0 10/10/2023     Last metabolic panel Lab Results  Component Value Date   NA 138 10/11/2023   K 4.6 10/11/2023   CL 105 10/11/2023   CO2 23 10/11/2023   BUN 33 (H) 10/11/2023   CREATININE 1.68 (H) 10/11/2023   GLUCOSE 117 (H) 10/11/2023   GFRNONAA 38 (L) 10/11/2023   GFRAA 58 (L) 06/25/2018   CALCIUM  8.4 (L) 10/11/2023   PROT 6.6 10/10/2023   ALBUMIN 3.5 10/10/2023   BILITOT 0.6 10/10/2023   ALKPHOS 102 10/10/2023   AST 12 (L) 10/10/2023   ALT 10 10/10/2023   ANIONGAP 10 10/11/2023    CBG (last 3)  Recent Labs    10/11/23 2152 10/12/23 0719 10/12/23 1117  GLUCAP 246* 141* 207*      Coagulation  Profile: No results for input(s): INR, PROTIME in the last 168 hours.   Radiology Studies: MR ABDOMEN WO CONTRAST Result Date: 10/11/2023 CLINICAL DATA:  Kidney cancer staging, metastatic disease evaluation EXAM: MRI ABDOMEN WITHOUT  CONTRAST TECHNIQUE: Multiplanar multisequence MR imaging was performed without the administration of intravenous contrast. COMPARISON:  Renal ultrasound, 10/10/2023 FINDINGS: Lower chest: No acute abnormality. Numerous small pulmonary nodules throughout the lung bases. Hepatobiliary: No obvious solid liver abnormality is seen. Fluid signal cysts and/or hemangiomata of the liver. Gallstones. Gallbladder wall thickening, or biliary dilatation. Pancreas: Unremarkable. No pancreatic ductal dilatation or surrounding inflammatory changes. Spleen: Splenomegaly, maximum span 14.9 cm. Adrenals/Urinary Tract: Adrenal glands are unremarkable. Large mass of the midportion of the right kidney with infiltration into the renal pelvis and renal vein, measuring at least 8.3 x 7.9 x 6.9 cm (series 4, image 29, series 3, image 16). Simple, benign left renal cortical cysts, for which no specific follow-up or characterization is required. No obvious left renal mass. No obvious calculi or hydronephrosis Stomach/Bowel: Stomach is within normal limits. No evidence of bowel wall thickening, distention, or inflammatory changes. Pancolonic diverticulosis Vascular/Lymphatic: Right renal vein invasion (series 4, image 23). Enlarged right retrocaval lymph node measuring 1.9 x 1.8 cm (series 4, image 21). Other: No abdominal wall hernia or abnormality. No ascites. Musculoskeletal: No acute osseous findings. T2 hyperintense, diffusion restricting body lesions, not clearly characterized by noncontrast MR, for example of T10 (series 4, image 8) IMPRESSION: 1. Large mass of the midportion of the right kidney with infiltration into the renal pelvis and renal vein, measuring at least 8.3 x 7.9 x 6.9 cm. 2. Enlarged  right retrocaval lymph node. 3. T2 hyperintense, diffusion restricting body lesions, not clearly characterized by noncontrast MR, but suspicious for vertebral body metastases. Contrast enhanced MRI of the spine would be helpful for more clear assessment. 4. Numerous small nodules throughout the lung bases, presumed pulmonary metastases. 5. Cholelithiasis. Electronically Signed   By: Marolyn JONETTA Jaksch M.D.   On: 10/11/2023 20:11   US  RENAL Result Date: 10/10/2023 CLINICAL DATA:  [id number].  Evaluation for gross hematuria. EXAM: RENAL / URINARY TRACT ULTRASOUND COMPLETE COMPARISON:  No prior ultrasound. The last CT that included the area was with contrast dated 05/18/2020. FINDINGS: Right Kidney: Renal measurements: 12.8 x 6.9 x 7.7 cm = volume: 354.5 mL. There is a complex, mostly solid partially cystic mass with patchy color flow in the superior pole measuring 5.8 x 5.6 x 6.4 cm. This is considered a renal cell carcinoma until proven otherwise. In 2022 there was an indeterminate 2.3 cm solid lesion in this location which was recommended for MRI follow-up, which if performed was not done in our system. Rest of the cortical echogenicity appears normal. There are several other simple cysts in this kidney in the mid to lower pole. Largest measured is 3 cm exophytic from the lower pole. No intrarenal stones or hydronephrosis are seen. Left Kidney: Renal measurements: 10.8 x 5.7 x 5.8 cm = volume: 185.9 1 mL. The cortical echogenicity of this kidney is increased. Again noted is a 3.2 cm simple cyst arising from the inferior pole, and an adjacent 1.5 cm parapelvic simple cyst. No solid lesion is suspected. No hydronephrosis is seen. In the mid pole of this kidney there is either a 3 mm nonobstructive caliceal stone or renal sinus fat mimicking a stone. There are no other visible stones. Bladder: Catheterized, contracted and obscured by bowel gas. Other: None. IMPRESSION: 1. 5.8 x 5.6 x 6.4 cm complex, mostly solid partially  cystic mass with patchy color flow in the superior pole of the right kidney. This is considered a renal cell carcinoma until proven otherwise. MRI or CT with contrast follow-up is  recommended. 2. Bilateral simple renal cysts. 3. 3 mm nonobstructive caliceal stone versus renal sinus fat mimicking a stone in the mid pole of the left kidney. 4. Increased cortical echogenicity of the left kidney which can be seen with medical renal disease. 5. Bladder catheterized, contracted and obscured by bowel gas. Electronically Signed   By: Francis Quam M.D.   On: 10/10/2023 21:21       Elgie Butter M.D. [facility] 10/12/2023, 2:25 PM  Available via Epic secure chat 7am-7pm After 7 pm, please refer to night coverage provider listed on amion.

## 2023-10-12 NOTE — Plan of Care (Signed)
   Problem: Coping: Goal: Ability to adjust to condition or change in health will improve Outcome: Progressing

## 2023-10-12 NOTE — Progress Notes (Signed)
 Patient complains of bladder spasms, several clots noted, foley catheter manually irrigated with 60cc NS,patient reported relief right after urine light pink in color.

## 2023-10-12 NOTE — Progress Notes (Signed)
 Hypoglycemic Event  CBG: 61  Treatment: 4 oz juice/soda  Symptoms: None  Follow-up CBG: Time: 2247 CBG Result:149  Possible Reasons for Event: Inadequate meal intake  Comments/MD notified: A. Chavez,NP    [name]

## 2023-10-12 NOTE — Consult Note (Signed)
 Consultation Note Date: 10/12/2023   Patient Name: [name]  DOB: 12-31-1930  MRN: [id number]  Age / Sex: 89 y.o., male  PCP: Watt Mirza, MD Referring Physician: Cherlyn Labella, MD  Reason for Consultation: Establishing goals of care  HPI/Patient Profile: 89 y.o. male  admitted on 10/10/2023    Clinical Assessment and Goals of Care: 89 year old gentleman with gross hematuria urinary retention and associated kidney injury. Has underlying history of coronary artery disease diabetes prostate cancer status post prostatectomy history of lung cancer status postresection in 2001, history of Alzheimer's disease macular degeneration skin cancer and stroke. On 7 - 9 - 25 patient underwent MRI of the abdomen that showed a large mass midportion of her right kidney that is infiltrating into the renal pelvis and renal vein.  Patient has enlarged right retrocaval lymph node, T2 hyperintense diffusion restricting body lesions suspicious for vertebral body metastases.  Also has numerous small nodules throughout the lung bases which are presumed pulmonary metastases. In light of recent diagnosis of serious illness as well as ongoing decline in functional status from underlying chronic conditions, palliative consult has been requested for CODE STATUS and for goals of care discussions. Chart reviewed Patient seen and examined Met with the patient, his wife is at bedside, they are 24/7 caregiver who lives with them is also at the bedside Palliative medicine is specialized medical care for people living with serious illness. It focuses on providing relief from the symptoms and stress of a serious illness. The goal is to improve quality of life for both the patient and the family. Goals of care: Broad aims of medical therapy in relation to the patient's values and preferences. Our aim is to provide medical care aimed at  enabling patients to achieve the goals that matter most to them, given the circumstances of their particular medical situation and their constraints.   We reviewed about current hospitalization.  Goals of care discussions were undertaken.  See below.  NEXT OF KIN Wife present at bedside Caregiver present at bedside Reportedly has 3 adult children 1 son and 2 daughters who live locally.  SUMMARY OF RECOMMENDATIONS   Issues pertaining to this hospitalization discussed.  Explained about high likelihood of renal cell cancer which appears to be metastatic to bone and lung at this point based on imaging.  Discussed about her ongoing decline in functional status and underlying chronic conditions.  Goals wishes and values attempted to be explored.  Patient and wife states that at some point in the past they had completed advance care planning documents however they are not sure whether they were notarized or not.  They have requested if any paperwork can be done in the hospital.  Will consult spiritual care for [facility] completion. CODE STATUS discussions undertaken.  Full explanation with regards to differences between full code versus DNR/DNI was provided.  Patient elects for DNR/DNI.  Patient does not want artificial measures at end-of-life.  This was discussed repeatedly and in detail and agreed upon. Introduced hospice philosophy of care.  Discussed with patient and family at bedside about what home with hospice would look like.  They are in agreement to proceed.  Will request TOC assistance. Patient is having a lot of pain from bladder spasms.  We discussed about low-dose opioids oxycodone  immediate release as well as adding low-dose oxybutynin  anticholinergic to help with the bladder spasms.  Will see how the patient does with these 2 measures.  Further consideration could be given to adding rectal opioids and/or considering muscle relaxer such as baclofen/diazepam if the patient does not have any  improvement with low-dose oxycodone  immediate release plus the oxybutynin . Thank you for the consult.  Palliative services will continue to follow along.  Code Status/Advance Care Planning: DNR   Symptom Management:     Palliative Prophylaxis:  Delirium Protocol   Psycho-social/Spiritual:  Desire for further Chaplaincy support:yes Additional Recommendations: Education on Hospice  Prognosis:  < 6 months  Discharge Planning: Home with Hospice      Primary Diagnoses: Present on Admission:  Urinary retention   I have reviewed the medical record, interviewed the patient and family, and examined the patient. The following aspects are pertinent.  Past Medical History:  Diagnosis Date   Adenocarcinoma of left lung, stage 1 (HCC) 2001   T1N0 stage I  adenoca left lung resected 01/03/00    Alzheimer's disease (HCC)    CAD (coronary artery disease) 2014   a. 09/2012 Cath: LM nl, LAD 50-60p, D1 60-70 m, D1 50ost, LCX nl, RCA min irregs, EF 55-65%.   Generalized anxiety disorder 01/08/2007   Qualifier: Diagnosis of  By: [name], [name]     History of colonic polyps 07/25/2011   Macular degeneration of both eyes 01/21/2014   Nonmelanoma skin cancer 07/25/2011   Multiple lesions excised face/nose   Pericarditis    a. 09/2012 with effusion and tamponade, s/p window.  b.  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 SCC IN SITU TX CX3   SCC (squamous cell carcinoma) 04/12/2005   RIGHT NECK SUP. SCC IN SITU TX EXC   SCC (squamous cell carcinoma) 04/16/2013   LEFT TEMPLE SCC IN SITU TX CX3 5FU   SCC (squamous cell carcinoma) 04/16/2013   BELOW LEFT EYE SCC IN SITU TX CX3 5FU   SCC (squamous cell carcinoma) 04/16/2013   BELOW RIGHT EYE SCC IN SITU TX CX3 5FU   SCC (squamous cell carcinoma) 08/04/2014   LEFT CHEEK SCC IN SITU TX CX3 5FU   SCC (squamous cell carcinoma) 11/27/2018   LEFT TEMPLE SCC IN SITU TX WITH BX   SCC (squamous cell carcinoma)    SCC (squamous cell carcinoma)    SCC (squamous cell carcinoma)    SCC (squamous cell carcinoma) Well Diff  09/13/2016   Tip of Nose SCC WELL DIFF TX (MOH's), and RIGHT INNER EYE SUP. SCC IN SITU TX TO WATCH   SCC (squamous cell carcinoma) Well Diff 05/30/2017   Right Cheekbone (Cx3,5FU) and Under Left Eye (Cx3,5FU)   Squamous cell carcinoma in situ (SCCIS) 04/12/2005   Right Neck Inf (Cx3), Right Neck Sup (Exc), and Bulb of Nose (Cx3,Exc)   Squamous cell carcinoma in situ (SCCIS) 09/27/2005   Nose (Cx3,Exc)   Squamous cell carcinoma in situ (SCCIS) 02/18/2013   Below Left Eye (tx p bx)   Squamous cell carcinoma in situ (SCCIS) 04/16/2013   Left Temple (Cx3,5FU), Below Left Eye (Cx3,5FU), Below Right Eye (Cx3,5FU)   Squamous cell carcinoma in situ (SCCIS) 08/04/2014   Left Cheek (Cx3,5FU)   Squamous cell carcinoma in situ (SCCIS) 09/13/2016   Right Inner Eye Sup. 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   Type 2 diabetes mellitus with vascular disease (HCC) 05/21/2007   Qualifier: Diagnosis of  By: [name], [name]     Type 2  DM with CKD stage 3 and hypertension (HCC) 07/16/2015   Unspecified essential hypertension    Social History   Socioeconomic History   Marital status: Married    Spouse name: Not on file   Number of children: Not on file   Years of education: Not on file   Highest education level: Not on file  Occupational History   Occupation: retired  Tobacco Use   Smoking status: Former    Current packs/day: 0.00    Average packs/day: 2.0 packs/day for 42.0 years (84.0 ttl pk-yrs)    Types: Cigarettes    Start date: 02/25/1953    Quit date: 04/04/1984    Years since quitting: 39.5   Smokeless tobacco: Former    Types: Chew    Quit date: 07/27/1986   Tobacco comments:    quit tobacco 26 years ago  Vaping Use   Vaping status: Never Used  Substance and Sexual Activity   Alcohol use: No    Alcohol/week: 0.0 standard drinks of alcohol   Drug use: No   Sexual activity: Not Currently  Other Topics Concern   Not on file  Social History Narrative   Lives in [place], [place]   Regular exercise--no, mowing grass   Diet: fruit and veggies   Social Drivers of Health   Financial Resource Strain: Low Risk  (08/10/2021)   Overall Financial Resource Strain (CARDIA)    Difficulty of Paying Living Expenses: Not hard at all  Food Insecurity: No Food Insecurity (10/10/2023)   Hunger Vital Sign    Worried About Running Out of Food in the Last Year: Never true    Ran Out of Food in the Last Year: Never true  Transportation Needs: No Transportation Needs (10/10/2023)   PRAPARE - Administrator, Civil Service (Medical): No    Lack of Transportation (Non-Medical): No  Physical Activity: Insufficiently Active (08/10/2021)   Exercise Vital Sign    Days of Exercise per Week: 3 days    Minutes of Exercise per Session: 30 min  Stress: No Stress Concern Present (08/10/2021)   [employer] of Occupational Health -  Occupational Stress Questionnaire    Feeling of Stress : Only a little  Social  Connections: Moderately Isolated (10/10/2023)   Social Connection and Isolation Panel    Frequency of Communication with Friends and Family: Once a week    Frequency of Social Gatherings with Friends and Family: Twice a week    Attends Religious Services: Never    Database administrator or Organizations: No    Attends Engineer, structural: Never    Marital Status: Married   Family History  Problem Relation Age of Onset   Cancer Father        colon   Dementia Mother    Diabetes Mother    Cancer Brother        lung   Scheduled Meds:  Chlorhexidine  Gluconate Cloth  6 each Topical Daily   insulin  aspart  0-15 Units Subcutaneous TID WC   insulin  aspart  0-5 Units Subcutaneous QHS   oxybutynin   2.5 mg Oral BID   Continuous Infusions: PRN Meds:.acetaminophen  **OR** acetaminophen , albuterol , ondansetron  **OR** ondansetron  (ZOFRAN ) IV, oxyCODONE , traZODone  Medications Prior to Admission:  Prior to Admission medications   Medication Sig Start Date End Date Taking? Authorizing Provider  atorvastatin  (LIPITOR) 20 MG tablet TAKE 1 TABLET BY MOUTH EVERY DAY Patient taking differently: Take 20 mg by mouth in the morning. 08/03/23  Yes Copland, Jacques, MD  donepezil  (ARICEPT ) 10 MG tablet TAKE 1 TABLET BY MOUTH EVERYDAY AT BEDTIME Patient taking differently: Take 10 mg by mouth at bedtime. 12/09/22  Yes Copland, Jacques, MD  ferrous sulfate  325 (65 FE) MG tablet TAKE 1 TABLET BY MOUTH EVERY DAY WITH BREAKFAST Patient taking differently: Take 325 mg by mouth daily with breakfast. 12/09/22  Yes Copland, Jacques, MD  glipiZIDE  (GLUCOTROL ) 10 MG tablet Take 1 tablet (10 mg total) by mouth 2 (two) times daily before a meal. 08/14/23  Yes Shamleffer, Donell Cardinal, MD  pantoprazole  (PROTONIX ) 40 MG tablet TAKE 1 TABLET BY MOUTH EVERY DAY Patient taking differently: Take 40 mg by mouth in the morning and at bedtime. 12/09/22  Yes Copland, Jacques, MD  Propylene Glycol (SYSTANE BALANCE) 0.6 % SOLN  Apply 1 drop to eye 2 (two) times daily as needed (dry eyes).   Yes [provider]  sertraline  (ZOLOFT ) 100 MG tablet Take 1 tablet (100 mg total) by mouth daily. Patient taking differently: Take 100 mg by mouth in the morning. 08/07/23  Yes Copland, Jacques, MD  tamsulosin  (FLOMAX ) 0.4 MG CAPS capsule TAKE 1 CAPSULE BY MOUTH EVERY DAY AFTER SUPPER Patient taking differently: Take 0.4 mg by mouth daily after supper. 12/09/22  Yes Copland, Jacques, MD  triamcinolone  cream (KENALOG ) 0.1 % Apply 1 application topically 2 (two) times daily. 06/02/21  Yes Copland, Jacques, MD  OZEMPIC , 0.25 OR 0.5 MG/DOSE, 2 MG/3ML SOPN Inject 0.25 mg into the skin once a week. Patient not taking: Reported on 10/10/2023 05/06/23   [provider]   Allergies  Allergen Reactions   Sulfa Antibiotics Nausea Only   Review of Systems Abdominal discomfort Physical Exam Elderly gentleman resting in bed Complains of abdominal discomfort Regular work of breathing Appears with generalized weakness Trace edema Has residual left sided weakness from previous stroke  Vital Signs: BP (!) 159/72 (BP Location: Left Arm)   Pulse 91   Temp 97.8 F (36.6 C) (Oral)   Resp 20   Ht 5' 9 (1.753 m)   Wt 78.9 kg   SpO2 99%   BMI 25.70 kg/m  Pain Scale: 0-10   Pain Score: 0-No pain   SpO2: SpO2: 99 % O2 Device:SpO2: 99 % O2 Flow Rate: .   IO: Intake/output summary:  Intake/Output Summary (Last 24 hours) at 10/12/2023 1417 Last data filed at 10/12/2023 1315 Gross per 24 hour  Intake 3311.6 ml  Output 1650 ml  Net 1661.6 ml    LBM: Last BM Date : 10/11/23 Baseline Weight: Weight: 78.9 kg Most recent weight: Weight: 78.9 kg     Palliative Assessment/Data:   Palliative performance scale 50%  Time In: 1300 Time Out: 1400 Time Total: 60 Greater than 50%  of this time was spent counseling and coordinating care related to the above assessment and plan.  Signed by: Lonia Serve, MD   Please contact  Palliative Medicine Team phone at [phone] for questions and concerns.  For individual provider: See Tracey

## 2023-10-12 NOTE — Evaluation (Signed)
 Physical Therapy Evaluation Patient Details Name: [name] MRN: [id number] DOB: Aug 02, 1930 Today's Date: 10/12/2023  History of Present Illness  Mr. Lalani is a 89 yr old admitted on 10/10/23 for gross hematuria, urinary retention and associated acute kidney injury. PMH: CAD, DM II, prostate CA s/p prostatectomy, lung CA s/p recection in 2001, Alzheimer's disease, macular degeneration, skin CA, CVA  Clinical Impression  Pt admitted with above diagnosis.  Pt currently with functional limitations due to the deficits listed below (see PT Problem List). Pt will benefit from acute skilled PT to increase their independence and safety with mobility to allow discharge.  Spouse present and reports pt just received news about his cancer but encouraging pt to mobilize so he can be able to return home.  Pt and spouse would like to continue to keep pt's mobility and strength during this admission with goal to return home.  Pt' spouse has a caregiver who was also present today and states she is able to assist both as needed upon return home however pt has mostly been modified independent with rollator at baseline.  Pt has residual L LE weakness from previous CVA,  and spouse reports pt's movement appears close to his baseline at this time.  Pt encouraged to mobilize this admission as tolerated with staff.         If plan is discharge home, recommend the following: Assistance with cooking/housework;Help with stairs or ramp for entrance;Assist for transportation   Can travel by private vehicle        Equipment Recommendations None recommended by PT  Recommendations for Other Services       Functional Status Assessment Patient has had a recent decline in their functional status and demonstrates the ability to make significant improvements in function in a reasonable and predictable amount of time.     Precautions / Restrictions Precautions Precautions: Fall Restrictions Weight Bearing  Restrictions Per Provider Order: No      Mobility  Bed Mobility Overal bed mobility: Needs Assistance Bed Mobility: Supine to Sit, Sit to Supine     Supine to sit: Supervision, HOB elevated Sit to supine: Supervision, HOB elevated        Transfers Overall transfer level: Needs assistance Equipment used: Rolling walker (2 wheels) Transfers: Sit to/from Stand Sit to Stand: Contact guard assist           General transfer comment: increased time and effort, CGA for safety    Ambulation/Gait Ambulation/Gait assistance: Contact guard assist Gait Distance (Feet): 80 Feet Assistive device: Rolling walker (2 wheels) Gait Pattern/deviations: Step-through pattern, Decreased stride length Gait velocity: decr     General Gait Details: observed L LE weakness (however baseline per spouse from previous CVA) compensations however no overt LOB or unsteadiness observed, fatigued quickly  Stairs            Wheelchair Mobility     Tilt Bed    Modified Rankin (Stroke Patients Only)       Balance           Standing balance support: Bilateral upper extremity supported, Reliant on assistive device for balance Standing balance-Leahy Scale: Poor Standing balance comment: utilizes UE support                             Pertinent Vitals/Pain Pain Assessment Pain Assessment: No/denies pain    Home Living Family/patient expects to be discharged to:: Private residence Living Arrangements: Spouse/significant other Available Help  at Discharge: Family Type of Home: House Home Access: Ramped entrance       Home Layout: One level Home Equipment: Agricultural consultant (2 wheels);Hospital bed;Shower seat;Rollator (4 wheels) Additional Comments: They have a live-in caregiver who assists them with household chores, including cleaning and cooking.    Prior Function Prior Level of Function : Independent/Modified Independent             Mobility Comments: pt uses  rollator ADLs Comments:  (The pt was modified independent to independent with ADLs.)     Extremity/Trunk Assessment        Lower Extremity Assessment Lower Extremity Assessment: LLE deficits/detail LLE Deficits / Details: residual weaknesss from previous CVA       Communication   Communication Communication: Impaired Factors Affecting Communication: Hearing impaired    Cognition Arousal: Alert Behavior During Therapy: WFL for tasks assessed/performed   PT - Cognitive impairments: No apparent impairments                         Following commands: Intact       Cueing       General Comments      Exercises     Assessment/Plan    PT Assessment Patient needs continued PT services  PT Problem List Decreased strength;Decreased activity tolerance;Decreased mobility       PT Treatment Interventions DME instruction;Gait training;Balance training;Functional mobility training;Therapeutic activities;Therapeutic exercise;Patient/family education    PT Goals (Current goals can be found in the Care Plan section)  Acute Rehab PT Goals PT Goal Formulation: With patient/family Time For Goal Achievement: 10/26/23 Potential to Achieve Goals: Good    Frequency Min 2X/week     Co-evaluation               AM-PAC PT 6 Clicks Mobility  Outcome Measure Help needed turning from your back to your side while in a flat bed without using bedrails?: A Little Help needed moving from lying on your back to sitting on the side of a flat bed without using bedrails?: A Little Help needed moving to and from a bed to a chair (including a wheelchair)?: A Little Help needed standing up from a chair using your arms (e.g., wheelchair or bedside chair)?: A Little Help needed to walk in hospital room?: A Little Help needed climbing 3-5 steps with a railing? : A Little 6 Click Score: 18    End of Session Equipment Utilized During Treatment: Gait belt Activity Tolerance:  Patient tolerated treatment well Patient left: in bed;with call bell/phone within reach;with bed alarm set;with family/visitor present   PT Visit Diagnosis: Difficulty in walking, not elsewhere classified (R26.2)    Time: 8880-8867 PT Time Calculation (min) (ACUTE ONLY): 13 min   Charges:   PT Evaluation $PT Eval Low Complexity: 1 Low   PT General Charges $$ ACUTE PT VISIT: 1 Visit        Tari PT, DPT Physical Therapist Acute Rehabilitation Services Office: [phone]   Tari CROME [place] 10/12/2023, 1:38 PM

## 2023-10-13 DIAGNOSIS — N179 Acute kidney failure, unspecified: Secondary | ICD-10-CM | POA: Diagnosis not present

## 2023-10-13 DIAGNOSIS — R339 Retention of urine, unspecified: Secondary | ICD-10-CM | POA: Diagnosis not present

## 2023-10-13 DIAGNOSIS — D649 Anemia, unspecified: Secondary | ICD-10-CM | POA: Diagnosis not present

## 2023-10-13 DIAGNOSIS — R310 Gross hematuria: Secondary | ICD-10-CM | POA: Diagnosis not present

## 2023-10-13 LAB — BASIC METABOLIC PANEL WITH GFR
Anion gap: 11 (ref 5–15)
BUN: 29 mg/dL — ABNORMAL HIGH (ref 8–23)
CO2: 20 mmol/L — ABNORMAL LOW (ref 22–32)
Calcium: 8.4 mg/dL — ABNORMAL LOW (ref 8.9–10.3)
Chloride: 107 mmol/L (ref 98–111)
Creatinine, Ser: 2.22 mg/dL — ABNORMAL HIGH (ref 0.61–1.24)
GFR, Estimated: 27 mL/min — ABNORMAL LOW (ref >60)
Glucose, Bld: 252 mg/dL — ABNORMAL HIGH (ref 70–99)
Potassium: 5.4 mmol/L — ABNORMAL HIGH (ref 3.5–5.1)
Sodium: 138 mmol/L (ref 135–145)

## 2023-10-13 LAB — GLUCOSE, CAPILLARY
Glucose-Capillary: 155 mg/dL — ABNORMAL HIGH (ref 70–99)
Glucose-Capillary: 287 mg/dL — ABNORMAL HIGH (ref 70–99)
Glucose-Capillary: 139 mg/dL — ABNORMAL HIGH (ref 70–99)
Glucose-Capillary: 214 mg/dL — ABNORMAL HIGH (ref 70–99)

## 2023-10-13 MED ORDER — SODIUM CHLORIDE 0.9 % IV SOLN: INTRAVENOUS | Status: DC

## 2023-10-13 NOTE — Progress Notes (Signed)
 [facility]                                                                               [name], is a 89 y.o. male, DOB - 09-19-1930, FMW:[id number] Admit date - 10/10/2023    Outpatient Primary MD for the patient is Copland, Jacques, MD  LOS - 0  days    Brief summary   [name] is a 89 y.o. male with medical history significant for coronary artery disease, type 2 diabetes on Ozempic  and prostate cancer status post prostatectomy over a decade ago being admitted to the hospital with gross hematuria, urinary retention and associated acute kidney injury.  History is provided by the patient as well as his wife who is at the bedside, they state that he was in his usual state of health until he started having some vague nausea and lack of appetite about 3 days ago.  In the early morning hours yesterday, he noticed gross blood without clots in his urine, after which he was no longer able to urinate despite feeling that he had a full bladder.  He had some pain in the low pelvis radiating down into his penis.  He denies any dysuria, urgency or frequency prior to this, denies any chest pain, fevers, or vomiting.  Workup in the emergency department as detailed below shows evidence of urinary retention, acute kidney injury.  Foley catheter was placed and he has already been seen by urology consultant in the ER.    Assessment & Plan    Assessment and Plan:  Gross hematuria and urinary retention History of prostate cancer with radical prostatectomy Urology consulted and patient is on CBI.  S/p Foley catheter with good urine output.  Ultrasound of the kidneys showed  5.8 x 5.6 x 6.4 cm complex, mostly solid partially cystic mass with patchy color flow in the superior pole of the right kidney. This is considered a renal cell carcinoma until proven otherwise No signs of infection so far. MRI abd done showing Large mass of the midportion of the right kidney with infiltration  into the renal pelvis and renal vein, measuring at least 8.3 x 7.9 x 6.9 cm.  Discussed the results with the patient and his family at bedside.  Family did not want any surgery at this time. Wanted to go home with hospice once the urine clears up.       Type 2 diabetes Mellitus.  CBG (last 3)  Recent Labs    10/12/23 2247 10/13/23 0750 10/13/23 1116  GLUCAP 149* 155* 287*   Resume SSI. An episode of hypoglycemia earlier today.  D.c oral meds at this time.     AKI Creatinine improving with IV fluids continue to monitor. Creatinine improved from 2.4-1.68 to 2 today. Continue with IV fluids.  Repeat BMP today.    Anemia of chronic disease Superimposed anemia of blood loss from hematuria Transfuse  to keep it greater than 7. Hemoglobin stable around 8.6.  Hyperlipidemia Continue with lipitor.    Dementia Continue with aricept .    Palliative care consulted and family opted for home with hospice.      Estimated body mass index  is 25.7 kg/m as calculated from the following:   Height as of this encounter: 5' 9 (1.753 m).   Weight as of this encounter: 78.9 kg.  Code Status: DNR  DVT Prophylaxis:  SCDs Start: 10/10/23 1519   Level of Care: Level of care: Med-Surg Family Communication: Updated patient's family at bedside  Disposition Plan:     Remains inpatient appropriate: home hospice.   Procedures:  MRI abdomen  Consultants:   Urology.  Palliative care.   Antimicrobials:   Anti-infectives (From admission, onward)    None        Medications  Scheduled Meds:  atorvastatin   20 mg Oral Daily   Chlorhexidine  Gluconate Cloth  6 each Topical Daily   donepezil   10 mg Oral QHS   insulin  aspart  0-15 Units Subcutaneous TID WC   insulin  aspart  0-5 Units Subcutaneous QHS   oxybutynin   2.5 mg Oral BID   pantoprazole   40 mg Oral Daily   sertraline   100 mg Oral Daily   Continuous Infusions:   PRN Meds:.acetaminophen  **OR** acetaminophen ,  albuterol , ondansetron  **OR** ondansetron  (ZOFRAN ) IV, oxyCODONE , traZODone     Subjective:   [name] was seen and examined today. No new complaints.   Objective:   Vitals:   10/12/23 0624 10/12/23 1309 10/12/23 2148 10/13/23 0601  BP: (!) 152/68 (!) 159/72 (!) 151/66 (!) 152/71  Pulse: 70 91 74 68  Resp: 19 20 18 17   Temp: (!) 97.5 F (36.4 C) 97.8 F (36.6 C) 98.1 F (36.7 C) 97.8 F (36.6 C)  TempSrc: Oral Oral Oral Oral  SpO2: 98% 99% 97% 97%  Weight:      Height:        Intake/Output Summary (Last 24 hours) at 10/13/2023 1313 Last data filed at 10/13/2023 1216 Gross per 24 hour  Intake 840 ml  Output 1850 ml  Net -1010 ml   Filed Weights   10/10/23 1118 10/10/23 1700  Weight: 78.9 kg 78.9 kg     Exam General exam: Appears calm and comfortable  Respiratory system: Clear to auscultation. Respiratory effort normal. Cardiovascular system: S1 & S2 heard, RRR. No JVD, Gastrointestinal system: Abdomen is nondistended, soft and non tender Central nervous system: Alert and oriented.  Extremities: Symmetric 5 x 5 power. Skin: No rashes,  Psychiatry: Mood & affect appropriate.      Data Reviewed:  I have personally reviewed following labs and imaging studies   CBC Lab Results  Component Value Date   WBC 6.4 10/12/2023   RBC 2.81 (L) 10/12/2023   HGB 8.6 (L) 10/12/2023   HCT 27.9 (L) 10/12/2023   MCV 99.3 10/12/2023   MCH 30.6 10/12/2023   PLT 115 (L) 10/12/2023   MCHC 30.8 10/12/2023   RDW 12.2 10/12/2023   LYMPHSABS 0.4 (L) 10/12/2023   MONOABS 1.5 (H) 10/12/2023   EOSABS 0.0 10/12/2023   BASOSABS 0.0 10/12/2023     Last metabolic panel Lab Results  Component Value Date   NA 137 10/12/2023   K 5.2 (H) 10/12/2023   CL 106 10/12/2023   CO2 23 10/12/2023   BUN 31 (H) 10/12/2023   CREATININE 2.14 (H) 10/12/2023   GLUCOSE 269 (H) 10/12/2023   GFRNONAA 29 (L) 10/12/2023   GFRAA 58 (L) 06/25/2018   CALCIUM  8.3 (L) 10/12/2023   PROT 6.6  10/10/2023   ALBUMIN 3.5 10/10/2023   BILITOT 0.6 10/10/2023   ALKPHOS 102 10/10/2023   AST 12 (L) 10/10/2023   ALT 10 10/10/2023  ANIONGAP 8 10/12/2023    CBG (last 3)  Recent Labs    10/12/23 2247 10/13/23 0750 10/13/23 1116  GLUCAP 149* 155* 287*      Coagulation Profile: No results for input(s): INR, PROTIME in the last 168 hours.   Radiology Studies: MR ABDOMEN WO CONTRAST Result Date: 10/11/2023 CLINICAL DATA:  Kidney cancer staging, metastatic disease evaluation EXAM: MRI ABDOMEN WITHOUT CONTRAST TECHNIQUE: Multiplanar multisequence MR imaging was performed without the administration of intravenous contrast. COMPARISON:  Renal ultrasound, 10/10/2023 FINDINGS: Lower chest: No acute abnormality. Numerous small pulmonary nodules throughout the lung bases. Hepatobiliary: No obvious solid liver abnormality is seen. Fluid signal cysts and/or hemangiomata of the liver. Gallstones. Gallbladder wall thickening, or biliary dilatation. Pancreas: Unremarkable. No pancreatic ductal dilatation or surrounding inflammatory changes. Spleen: Splenomegaly, maximum span 14.9 cm. Adrenals/Urinary Tract: Adrenal glands are unremarkable. Large mass of the midportion of the right kidney with infiltration into the renal pelvis and renal vein, measuring at least 8.3 x 7.9 x 6.9 cm (series 4, image 29, series 3, image 16). Simple, benign left renal cortical cysts, for which no specific follow-up or characterization is required. No obvious left renal mass. No obvious calculi or hydronephrosis Stomach/Bowel: Stomach is within normal limits. No evidence of bowel wall thickening, distention, or inflammatory changes. Pancolonic diverticulosis Vascular/Lymphatic: Right renal vein invasion (series 4, image 23). Enlarged right retrocaval lymph node measuring 1.9 x 1.8 cm (series 4, image 21). Other: No abdominal wall hernia or abnormality. No ascites. Musculoskeletal: No acute osseous findings. T2 hyperintense,  diffusion restricting body lesions, not clearly characterized by noncontrast MR, for example of T10 (series 4, image 8) IMPRESSION: 1. Large mass of the midportion of the right kidney with infiltration into the renal pelvis and renal vein, measuring at least 8.3 x 7.9 x 6.9 cm. 2. Enlarged right retrocaval lymph node. 3. T2 hyperintense, diffusion restricting body lesions, not clearly characterized by noncontrast MR, but suspicious for vertebral body metastases. Contrast enhanced MRI of the spine would be helpful for more clear assessment. 4. Numerous small nodules throughout the lung bases, presumed pulmonary metastases. 5. Cholelithiasis. Electronically Signed   By: Marolyn JONETTA Jaksch M.D.   On: 10/11/2023 20:11       Elgie Butter M.D. [facility] 10/13/2023, 1:13 PM  Available via Epic secure chat 7am-7pm After 7 pm, please refer to night coverage provider listed on amion.

## 2023-10-13 NOTE — Progress Notes (Signed)
 Daily Progress Note   Patient Name: [name]       Date: 10/13/2023 DOB: 1931/03/16  Age: 89 y.o. MRN#: [id number] Attending Physician: Cherlyn Labella, MD Primary Care Physician: Watt Mirza, MD Admit Date: 10/10/2023  Reason for Consultation/Follow-up: Establishing goals of care  Subjective: Awake alert Ate breakfast Daughter at bedside  Length of Stay: 0  Current Medications: Scheduled Meds:   atorvastatin   20 mg Oral Daily   Chlorhexidine  Gluconate Cloth  6 each Topical Daily   donepezil   10 mg Oral QHS   insulin  aspart  0-15 Units Subcutaneous TID WC   insulin  aspart  0-5 Units Subcutaneous QHS   oxybutynin   2.5 mg Oral BID   pantoprazole   40 mg Oral Daily   sertraline   100 mg Oral Daily    Continuous Infusions:   PRN Meds: acetaminophen  **OR** acetaminophen , albuterol , ondansetron  **OR** ondansetron  (ZOFRAN ) IV, oxyCODONE , traZODone   Physical Exam         Awake alert Has catheter Regular work of breathing Generalized weakness  Vital Signs: BP (!) 152/71 (BP Location: Left Arm)   Pulse 68   Temp 97.8 F (36.6 C) (Oral)   Resp 17   Ht 5' 9 (1.753 m)   Wt 78.9 kg   SpO2 97%   BMI 25.70 kg/m  SpO2: SpO2: 97 % O2 Device: O2 Device: Room Air O2 Flow Rate:    Intake/output summary:  Intake/Output Summary (Last 24 hours) at 10/13/2023 1013 Last data filed at 10/13/2023 0900 Gross per 24 hour  Intake 840 ml  Output 2000 ml  Net -1160 ml   LBM: Last BM Date : 10/11/23 Baseline Weight: Weight: 78.9 kg Most recent weight: Weight: 78.9 kg       Palliative Assessment/Data:      Patient Active Problem List   Diagnosis Date Noted   Urinary retention 10/10/2023   Decreased dorsalis pedis pulse 05/19/2023   Stage 3a chronic kidney disease (HCC)  04/26/2022   Type 2 diabetes mellitus with stage 3a chronic kidney disease, without long-term current use of insulin  (HCC) 09/29/2021   Right pontine cerebrovascular accident (HCC) 03/12/2021   GERD (gastroesophageal reflux disease) 03/08/2021   Sensorineural hearing loss (SNHL) of both ears 05/15/2017   Macular degeneration of both eyes 01/21/2014   Tobacco abuse, 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/08/2013  CAD (coronary artery disease)    Pericardial tamponade 09/17/2012   Acute pericarditis, unspecified 09/13/2012   Adenocarcinoma of left lung, stage 1 (HCC) 07/25/2011   Cancer of prostate w/med recur risk (T2b-c or Gleason 7 or PSA 10-20) (HCC) 07/25/2011   Nonmelanoma skin cancer 07/25/2011   History of colonic polyps 07/25/2011   HYPERCHOLESTEROLEMIA 05/21/2007   CARCINOMA, SKIN, SQUAMOUS CELL 01/08/2007   Generalized anxiety disorder 01/08/2007   ALZHEIMER'S DISEASE, MILD 01/08/2007   Essential hypertension 01/08/2007    Palliative Care Assessment & Plan   Patient Profile:    Assessment:  89 year old gentleman with gross hematuria urinary retention and associated kidney injury. Has underlying history of coronary artery disease diabetes prostate cancer status post prostatectomy history of lung cancer status postresection in 2001, history of Alzheimer's disease macular degeneration skin cancer and stroke. On 7 - 9 - 25 patient underwent MRI of the abdomen that showed a large mass midportion of her right kidney that is infiltrating into the renal pelvis and renal vein.  Patient has enlarged right retrocaval lymph node, T2 hyperintense diffusion restricting body lesions suspicious for vertebral body metastases.  Also has numerous small nodules throughout the lung bases which are presumed pulmonary metastases. In light of recent diagnosis of serious illness as well as ongoing decline in functional status from underlying chronic conditions, palliative consult has been requested for CODE STATUS and for  goals of care discussions.  Recommendations/Plan:  DNR DNI Voiding trial today Continue current pain and non-pain symptom management regimen Home with hospice, TOC consulted.     Code Status:    Code Status Orders  (From admission, onward)           Start     Ordered   10/12/23 1405  Do not attempt resuscitation (DNR)- Limited -Do Not Intubate (DNI)  (Code Status)  Continuous       Question Answer Comment  If pulseless and not breathing No CPR or chest compressions.   In Pre-Arrest Conditions (Patient Is Breathing and Has A Pulse) Do not intubate. Provide all appropriate non-invasive medical interventions. Avoid ICU transfer unless indicated or required.   Consent: Discussion documented in EHR or advanced directives reviewed      10/12/23 1417           Code Status History     Date Active Date Inactive Code Status Order ID Comments User Context   10/10/2023 1520 10/12/2023 1417 Full Code [id number]  Zella Katha HERO, MD ED   03/12/2021 1556 04/02/2021 1654 Full Code [id number]  Pegge Toribio PARAS, PA-C Inpatient   03/08/2021 0118 03/12/2021 1540 Full Code [id number]  Marcene Eva NOVAK, DO ED   09/16/2012 1821 09/20/2012 1851 Full Code [id number]  Dusty Sudie DEL, MD Inpatient       Prognosis:  < 6 months  Discharge Planning: Home with Hospice  Care plan was discussed with patient and daughter at bedside, also discussed with TOC.   Thank you for allowing the Palliative Medicine Team to assist in the care of this patient. Mod MDM.      Greater than 50%  of this time was spent counseling and coordinating care related to the above assessment and plan.  Lonia Serve, MD  Please contact Palliative Medicine Team phone at [phone] for questions and concerns.

## 2023-10-13 NOTE — Progress Notes (Signed)
 Chaplains received a consult to assist [name] with advance directives and HCPOA paperwork.  I brought the paperwork and provided some education about it for [name] and his granddaughter, [name].  Because he would want his wife to make decisions, I explained that it is not strictly necessary for him to fill out the paperwork because she is legally his next of kin and would be the one consulted.  They expressed no additional needs at this time.

## 2023-10-13 NOTE — Progress Notes (Signed)
 Subjective: Sleeping   Objective: Vital signs in last 24 hours: Temp:  [97.8 F (36.6 C)-98.1 F (36.7 C)] 97.8 F (36.6 C) (07/11 0601) Pulse Rate:  [68-91] 68 (07/11 0601) Resp:  [17-20] 17 (07/11 0601) BP: (151-159)/(66-72) 152/71 (07/11 0601) SpO2:  [97 %-99 %] 97 % (07/11 0601)  Intake/Output from previous day: 07/10 0701 - 07/11 0700 In: 1080 [P.O.:1080] Out: 2000 [Urine:2000] Intake/Output this shift: No intake/output data recorded.  Physical Exam:  General: Alert and oriented CV: RRR Lungs: Clear GU: urine light pink   Lab Results: Recent Labs    10/10/23 1243 10/11/23 0442 10/12/23 1534  HGB 9.3* 8.7* 8.6*  HCT 28.8* 26.9* 27.9*   BMET Recent Labs    10/11/23 0442 10/12/23 1534  NA 138 137  K 4.6 5.2*  CL 105 106  CO2 23 23  GLUCOSE 117* 269*  BUN 33* 31*  CREATININE 1.68* 2.14*  CALCIUM  8.4* 8.3*     Studies/Results: MR ABDOMEN WO CONTRAST Result Date: 10/11/2023 CLINICAL DATA:  Kidney cancer staging, metastatic disease evaluation EXAM: MRI ABDOMEN WITHOUT CONTRAST TECHNIQUE: Multiplanar multisequence MR imaging was performed without the administration of intravenous contrast. COMPARISON:  Renal ultrasound, 10/10/2023 FINDINGS: Lower chest: No acute abnormality. Numerous small pulmonary nodules throughout the lung bases. Hepatobiliary: No obvious solid liver abnormality is seen. Fluid signal cysts and/or hemangiomata of the liver. Gallstones. Gallbladder wall thickening, or biliary dilatation. Pancreas: Unremarkable. No pancreatic ductal dilatation or surrounding inflammatory changes. Spleen: Splenomegaly, maximum span 14.9 cm. Adrenals/Urinary Tract: Adrenal glands are unremarkable. Large mass of the midportion of the right kidney with infiltration into the renal pelvis and renal vein, measuring at least 8.3 x 7.9 x 6.9 cm (series 4, image 29, series 3, image 16). Simple, benign left renal cortical cysts, for which no specific follow-up or  characterization is required. No obvious left renal mass. No obvious calculi or hydronephrosis Stomach/Bowel: Stomach is within normal limits. No evidence of bowel wall thickening, distention, or inflammatory changes. Pancolonic diverticulosis Vascular/Lymphatic: Right renal vein invasion (series 4, image 23). Enlarged right retrocaval lymph node measuring 1.9 x 1.8 cm (series 4, image 21). Other: No abdominal wall hernia or abnormality. No ascites. Musculoskeletal: No acute osseous findings. T2 hyperintense, diffusion restricting body lesions, not clearly characterized by noncontrast MR, for example of T10 (series 4, image 8) IMPRESSION: 1. Large mass of the midportion of the right kidney with infiltration into the renal pelvis and renal vein, measuring at least 8.3 x 7.9 x 6.9 cm. 2. Enlarged right retrocaval lymph node. 3. T2 hyperintense, diffusion restricting body lesions, not clearly characterized by noncontrast MR, but suspicious for vertebral body metastases. Contrast enhanced MRI of the spine would be helpful for more clear assessment. 4. Numerous small nodules throughout the lung bases, presumed pulmonary metastases. 5. Cholelithiasis. Electronically Signed   By: Marolyn JONETTA Jaksch M.D.   On: 10/11/2023 20:11    Assessment/Plan: 74 M w/ R renal mass concerning for metastasis and gross hematuria.    # Hematuria - hematuria very light pink today  - Pt pursing palliative care no indication to work up    # Urinary retention  PT can have void trial today    # Right renal mass w/ lung and bone metastasis  - had long discussion with family about therapy we discussed surgery and immunotherapy. Patient has already chose to pursue hospice and dies not want the risks or the side effects of immunotherapy or surgery. He does not need f/u with urology unless  he is unable to urinate.  - Since pursing palliative/ hopsice no f/u needed    LOS: 0 days   Jackey Pea MD 10/13/2023, 7:08 AM [facility]

## 2023-10-13 NOTE — Progress Notes (Signed)
 [facility] Liaison Note  Received request from [facility] for hospice services at home after discharge. Spoke with patient and daughter Marval to initiate education related to hospice philosophy, services and team approach to care.  Both verbalized understanding of information given. Per discussion, the plan is for discharge home possibly tomorrow or Sunday.   DME needs discussed. Patient has the following equipment in the home: hospital bed, rollator, lift chair. Family requests the following equipment for delivery: none at this time, AV nurse to assess for needs.  Please send signed and completed DNR home with patient/family. Please provide prescriptions at discharge as needed to ensure ongoing symptom management.  [facility] information and contact numbers given to Debbie. Please call with any concerns.  Thank you for the opportunity to participate in this patient's care.   Eleanor Nail, LPN [facility] Liaison [phone]

## 2023-10-13 NOTE — Inpatient Diabetes Management (Signed)
 Inpatient Diabetes Program Recommendations  AACE/ADA: New Consensus Statement on Inpatient Glycemic Control (2015)  Target Ranges:  Prepandial:   less than 140 mg/dL      Peak postprandial:   less than 180 mg/dL (1-2 hours)      Critically ill patients:  140 - 180 mg/dL   Lab Results  Component Value Date   GLUCAP 155 (H) 10/13/2023   HGBA1C 6.9 (A) 05/19/2023    Latest Reference Range & Units 10/12/23 07:19 10/12/23 11:17 10/12/23 16:43 10/12/23 21:45 10/12/23 22:47 10/13/23 07:50  Glucose-Capillary 70 - 99 mg/dL 858 (H) 792 (H) 707 (H) Novolog  8 units 61 (L) 149 (H) 155 (H)  (H): Data is abnormally high (L): Data is abnormally low  Diabetes history: DM2 Outpatient Diabetes medications: Glucotrol  10 mg bid Current orders for Inpatient glycemic control: Glucotrol  10 mg bid, Novolog  0-15 units tid, 0-5 units hs correction  Inpatient Diabetes Program Recommendations:   Patient had hypoglycemia post Novolog  correction last hs. Please consider while in the hospital: -D/C Glucotrol  -Decrease Novolog  correction to 0-9 units tid, 0-5 units hs  Thank you, Angela Vazguez E. Armstrong Creasy, RN, MSN, CDCES  Diabetes Coordinator Inpatient Glycemic Control Team Team Pager [phone] (8am-5pm) 10/13/2023 10:10 AM

## 2023-10-13 NOTE — Progress Notes (Signed)
 Occupational Therapy Treatment Patient Details Name: [name] MRN: [id number] DOB: 1930/10/13 Today's Date: 10/13/2023   History of present illness [name] is a 89 yr old admitted on 10/10/23 for gross hematuria, urinary retention and associated acute kidney injury. PMH: CAD, DM II, prostate CA s/p prostatectomy, lung CA s/p recection in 2001, Alzheimer's disease, macular degeneration, skin CA, CVA   OT comments  The pt presented with good participation in the session. He progressed to performing toileting at bathroom level and grooming in standing at the sink. He denied having pain. He required CGA for most tasks. He is making gradual functional progress. Continue OT plan of care. No post-hospital therapy needs anticipated.       If plan is discharge home, recommend the following:  Assistance with cooking/housework;Assist for transportation;Help with stairs or ramp for entrance   Equipment Recommendations  None recommended by OT    Recommendations for Other Services      Precautions / Restrictions Restrictions Weight Bearing Restrictions Per Provider Order: No Other Position/Activity Restrictions: pt reports a history of CVA with resultant LLE weakness       Mobility Bed Mobility   Bed Mobility: Supine to Sit, Sit to Supine     Supine to sit: Supervision, HOB elevated Sit to supine: Supervision, HOB elevated        Transfers Overall transfer level: Needs assistance Equipment used: Rolling walker (2 wheels) Transfers: Sit to/from Stand Sit to Stand: Contact guard assist                 Balance     Sitting balance-Leahy Scale: Good         Standing balance comment: CGA with RW             ADL either performed or assessed with clinical judgement   ADL Overall ADL's : Needs assistance/impaired     Grooming: Contact guard assist;Standing Grooming Details (indicate cue type and reason): He required light steadying assist to perform hand  washing in standing at the sink.                 Toilet Transfer: Contact guard assist;Rolling walker (2 wheels);Ambulation Toilet Transfer Details (indicate cue type and reason): He performed a toilet transfer at bathroom level. 1 verbal cue provided for best walker placement. Toileting- Clothing Manipulation and Hygiene: Sit to/from stand;Minimal assistance Toileting - Clothing Manipulation Details (indicate cue type and reason): The pt performed toileting management at bathroom level. He required min assist for clothing management and intermittent steadying assist in standing. He performed posterior hygiene in sitting with SBA after having a bowel movement.              Cognition Arousal: Alert Behavior During Therapy: WFL for tasks assessed/performed Cognition: History of cognitive impairments             OT - Cognition Comments: Oriented x4, able to follow 1 step commands consistently, occasional difficulty with memory/recall, his medical chart indicates a history of Alzheimer's disease                 Following commands: Intact                      Pertinent Vitals/ Pain       Pain Assessment Pain Assessment: No/denies pain     Prior Functioning/Environment    Frequency  Min 2X/week        Progress Toward Goals  OT Goals(current goals can  now be found in the care plan section)  Progress towards OT goals: Progressing toward goals  Acute Rehab OT Goals OT Goal Formulation: With patient Time For Goal Achievement: 10/25/23 Potential to Achieve Goals: Good  Plan         AM-PAC OT 6 Clicks Daily Activity     Outcome Measure   Help from another person eating meals?: None Help from another person taking care of personal grooming?: A Little Help from another person toileting, which includes using toliet, bedpan, or urinal?: A Little Help from another person bathing (including washing, rinsing, drying)?: A Little Help from another person  to put on and taking off regular upper body clothing?: A Little Help from another person to put on and taking off regular lower body clothing?: A Little 6 Click Score: 19    End of Session Equipment Utilized During Treatment: Gait belt;Rolling walker (2 wheels)  OT Visit Diagnosis: Unsteadiness on feet (R26.81);Muscle weakness (generalized) (M62.81)   Activity Tolerance Patient tolerated treatment well   Patient Left in bed;with call bell/phone within reach;with bed alarm set;with family/visitor present   Nurse Communication Mobility status        Time: 8484-8465 OT Time Calculation (min): 19 min  Charges: OT General Charges $OT Visit: 1 Visit OT Treatments $Self Care/Home Management : 8-22 mins     [name], OTR/L 10/13/2023, 5:18 PM

## 2023-10-13 NOTE — TOC Initial Note (Signed)
 Transition of Care [facility]) - Initial/Assessment Note    Patient Details  Name: [name] MRN: [id number] Date of Birth: 21-Dec-1930  [facility]) CM/SW Contact:    Sheri ONEIDA Sharps, LCSW Phone Number: 10/13/2023, 11:21 AM  Clinical Narrative:                 Pt from w/ family. Pt continues medical workup. Pt recommended for home hospice. CSW provided choice and family chose [facility]. [facility] referral sent. TOC following for dc needs.  Expected Discharge Plan: Home w Hospice Care Barriers to Discharge: Continued Medical Work up   Patient Goals and CMS Choice Patient states their goals for this hospitalization and ongoing recovery are:: return home w/ hospice CMS Medicare.gov Compare Post Acute Care list provided to::  (NA) Choice offered to / list presented to : NA [place] ownership interest in [facility]provided to::  (NA)    Expected Discharge Plan and Services In-house Referral: NA Discharge Planning Services: NA Post Acute Care Choice: Hospice Living arrangements for the past 2 months: Single Family Home                 DME Arranged: N/A DME Agency: NA       HH Arranged: NA HH Agency: NA        Prior Living Arrangements/Services Living arrangements for the past 2 months: Single Family Home Lives with:: Spouse Patient language and need for interpreter reviewed:: Yes Do you feel safe going back to the place where you live?: Yes        Care giver support system in place?: Yes (comment)   Criminal Activity/Legal Involvement Pertinent to Current Situation/Hospitalization: No - Comment as needed  Activities of Daily Living   ADL Screening (condition at time of admission) Independently performs ADLs?: No Does the patient have a NEW difficulty with bathing/dressing/toileting/self-feeding that is expected to last >3 days?: No Does the patient have a NEW difficulty with getting in/out of bed, walking, or climbing stairs that is expected to last >3  days?: No Does the patient have a NEW difficulty with communication that is expected to last >3 days?: No Is the patient deaf or have difficulty hearing?: Yes Does the patient have difficulty seeing, even when wearing glasses/contacts?: Yes Does the patient have difficulty concentrating, remembering, or making decisions?: No  Permission Sought/Granted                  Emotional Assessment Appearance:: Appears stated age Attitude/Demeanor/Rapport: Engaged Affect (typically observed): Accepting Orientation: : Oriented to Self, Oriented to Place, Oriented to  Time, Oriented to Situation Alcohol / Substance Use: Not Applicable Psych Involvement: No (comment)  Admission diagnosis:  Urinary retention [R33.9] Patient Active Problem List   Diagnosis Date Noted   Urinary retention 10/10/2023   Decreased dorsalis pedis pulse 05/19/2023   Stage 3a chronic kidney disease (HCC) 04/26/2022   Type 2 diabetes mellitus with stage 3a chronic kidney disease, without long-term current use of insulin  (HCC) 09/29/2021   Right pontine cerebrovascular accident (HCC) 03/12/2021   GERD (gastroesophageal reflux disease) 03/08/2021   Sensorineural hearing loss (SNHL) of both ears 05/15/2017   Macular degeneration of both eyes 01/21/2014   Tobacco abuse, 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/08/2013   CAD (coronary artery disease)    Pericardial tamponade 09/17/2012   Acute pericarditis, unspecified 09/13/2012   Adenocarcinoma of left lung, stage 1 (HCC) 07/25/2011   Cancer of prostate w/med recur risk (T2b-c or Gleason 7 or PSA 10-20) (HCC)  07/25/2011   Nonmelanoma skin cancer 07/25/2011   History of colonic polyps 07/25/2011   HYPERCHOLESTEROLEMIA 05/21/2007   CARCINOMA, SKIN, SQUAMOUS CELL 01/08/2007   Generalized anxiety disorder 01/08/2007   ALZHEIMER'S DISEASE, MILD 01/08/2007   Essential hypertension 01/08/2007   PCP:  Watt Mirza, MD Pharmacy:   [facility] [phone] - [place], [place] - [address] AT [facility] [address] [place] [address] Phone: [phone] Fax: [phone]     Social Drivers of Health (SDOH) Social History: SDOH Screenings   Food Insecurity: No Food Insecurity (10/10/2023)  Housing: Low Risk  (10/10/2023)  Transportation Needs: No Transportation Needs (10/10/2023)  Utilities: Not At Risk (10/10/2023)  Alcohol Screen: Low Risk  (08/10/2021)  Depression (PHQ2-9): High Risk (10/10/2023)  Financial Resource Strain: Low Risk  (08/10/2021)  Physical Activity: Insufficiently Active (08/10/2021)  Social Connections: Moderately Isolated (10/10/2023)  Stress: No Stress Concern Present (08/10/2021)  Tobacco Use: Medium Risk (10/10/2023)   SDOH Interventions:     Readmission Risk Interventions     No data to display

## 2023-10-14 DIAGNOSIS — E1169 Type 2 diabetes mellitus with other specified complication: Secondary | ICD-10-CM | POA: Diagnosis not present

## 2023-10-14 DIAGNOSIS — E875 Hyperkalemia: Secondary | ICD-10-CM | POA: Diagnosis not present

## 2023-10-14 DIAGNOSIS — C649 Malignant neoplasm of unspecified kidney, except renal pelvis: Secondary | ICD-10-CM | POA: Diagnosis not present

## 2023-10-14 DIAGNOSIS — R339 Retention of urine, unspecified: Secondary | ICD-10-CM | POA: Diagnosis not present

## 2023-10-14 LAB — GLUCOSE, CAPILLARY: Glucose-Capillary: 189 mg/dL — ABNORMAL HIGH (ref 70–99)

## 2023-10-14 MED ORDER — SODIUM ZIRCONIUM CYCLOSILICATE 10 G PO PACK: 10.0000 g | PACK | Freq: Two times a day (BID) | ORAL | 0 refills | Status: AC

## 2023-10-14 MED ORDER — OXYCODONE HCL 5 MG PO TABS: 5.0000 mg | ORAL_TABLET | Freq: Three times a day (TID) | ORAL | 0 refills | Status: AC | PRN

## 2023-10-14 MED ORDER — SODIUM ZIRCONIUM CYCLOSILICATE 10 G PO PACK
10.0000 g | PACK | Freq: Two times a day (BID) | ORAL | Status: DC
  Filled 2023-10-14: qty 1

## 2023-10-14 MED ORDER — OXYBUTYNIN CHLORIDE 2.5 MG PO TABS: 2.5000 mg | ORAL_TABLET | Freq: Two times a day (BID) | ORAL | 0 refills | Status: DC

## 2023-10-14 NOTE — Discharge Summary (Signed)
 Physician Discharge Summary   Patient: [name] MRN: [id number] DOB: 04-27-1930  Admit date:     10/10/2023  Discharge date: 10/14/23  Discharge Physician: Elgie Butter   PCP: Watt Mirza, MD   Recommendations at discharge:  Please follow up with home hospice  Please   Discharge Diagnoses: Principal Problem:   Urinary retention  Resolved Problems:   * No resolved hospital problems. *  Hospital Course: [name] is a 89 y.o. male with medical history significant for coronary artery disease, type 2 diabetes on Ozempic  and prostate cancer status post prostatectomy over a decade ago being admitted to the hospital with gross hematuria, urinary retention and associated acute kidney injury.  History is provided by the patient as well as his wife who is at the bedside, they state that he was in his usual state of health until he started having some vague nausea and lack of appetite about 3 days ago.  In the early morning hours yesterday, he noticed gross blood without clots in his urine, after which he was no longer able to urinate despite feeling that he had a full bladder.  He had some pain in the low pelvis radiating down into his penis.  He denies any dysuria, urgency or frequency prior to this, denies any chest pain, fevers, or vomiting.  Workup in the emergency department as detailed below shows evidence of urinary retention, acute kidney injury.  Foley catheter was placed and he has already been seen by urology consultant in the ER.   Assessment and Plan:    Gross hematuria and urinary retention History of prostate cancer with radical prostatectomy Urology consulted and patient is on CBI.  S/p Foley catheter with good urine output.  Ultrasound of the kidneys showed  5.8 x 5.6 x 6.4 cm complex, mostly solid partially cystic mass with patchy color flow in the superior pole of the right kidney. This is considered a renal cell carcinoma until proven otherwise No signs of  infection so far. MRI abd done showing Large mass of the midportion of the right kidney with infiltration into the renal pelvis and renal vein, measuring at least 8.3 x 7.9 x 6.9 cm.  Discussed the results with the patient and his family at bedside.  Family did not want any surgery at this time. Wanted to go home with hospice.           Type 2 diabetes Mellitus.  Hypoglycemic on glipizide .  D.c oral meds at this time.        AKI Creatinine slightly improved with IV lfuids and back at 2.2.    Hyperkalemia Lokelma  ordered.       Anemia of chronic disease Superimposed anemia of blood loss from hematuria Transfuse  to keep it greater than 7. Hemoglobin stable around 8.6.   Hyperlipidemia Continue with lipitor.      Dementia Continue with aricept .      Palliative care consulted and family opted for home with hospice.        Consultants: urology, palliative care.  Procedures performed: none.   Disposition: Hospice care Diet recommendation:  Discharge Diet Orders (From admission, onward)     Start     Ordered   10/14/23 0000  Diet - low sodium heart healthy        10/14/23 0951           Regular diet DISCHARGE MEDICATION: Allergies as of 10/14/2023       Reactions   Sulfa Antibiotics  Nausea Only        Medication List     STOP taking these medications    glipiZIDE  10 MG tablet Commonly known as: GLUCOTROL    Ozempic  (0.25 or 0.5 MG/DOSE) 2 MG/3ML Sopn Generic drug: Semaglutide (0.25 or 0.5MG /DOS)   tamsulosin  0.4 MG Caps capsule Commonly known as: FLOMAX        TAKE these medications    atorvastatin  20 MG tablet Commonly known as: LIPITOR TAKE 1 TABLET BY MOUTH EVERY DAY What changed: when to take this   donepezil  10 MG tablet Commonly known as: ARICEPT  TAKE 1 TABLET BY MOUTH EVERYDAY AT BEDTIME What changed: See the new instructions.   ferrous sulfate  325 (65 FE) MG tablet TAKE 1 TABLET BY MOUTH EVERY DAY WITH BREAKFAST What  changed: See the new instructions.   oxyBUTYnin  Chloride 2.5 MG Tabs Take 2.5 mg by mouth 2 (two) times daily.   oxyCODONE  5 MG immediate release tablet Commonly known as: Oxy IR/ROXICODONE  Take 1 tablet (5 mg total) by mouth every 8 (eight) hours as needed for up to 5 days for severe pain (pain score 7-10).   pantoprazole  40 MG tablet Commonly known as: PROTONIX  TAKE 1 TABLET BY MOUTH EVERY DAY What changed: when to take this   sertraline  100 MG tablet Commonly known as: ZOLOFT  Take 1 tablet (100 mg total) by mouth daily. What changed: when to take this   Systane Balance 0.6 % Soln Generic drug: Propylene Glycol Apply 1 drop to eye 2 (two) times daily as needed (dry eyes).   triamcinolone  cream 0.1 % Commonly known as: KENALOG  Apply 1 application topically 2 (two) times daily.        Follow-up Information     Copland, Spencer, MD. Schedule an appointment as soon as possible for a visit in 1 week(s).   Specialties: Family Medicine, Sports Medicine Contact information: [address] [place] [address] [phone]                Discharge Exam: Fredricka Weights   10/10/23 1118 10/10/23 1700  Weight: 78.9 kg 78.9 kg   General exam: Appears calm and comfortable  Respiratory system: Clear to auscultation. Respiratory effort normal. Cardiovascular system: S1 & S2 heard, RRR. Gastrointestinal system: Abdomen is nondistended, soft and nontender.  Central nervous system: Alert and oriented. Extremities: Symmetric 5 x 5 power. Skin: No rashes,  Psychiatry: Mood & affect appropriate.    Condition at discharge: fair  The results of significant diagnostics from this hospitalization (including imaging, microbiology, ancillary and laboratory) are listed below for reference.   Imaging Studies: MR ABDOMEN WO CONTRAST Result Date: 10/11/2023 CLINICAL DATA:  Kidney cancer staging, metastatic disease evaluation EXAM: MRI ABDOMEN WITHOUT CONTRAST TECHNIQUE:  Multiplanar multisequence MR imaging was performed without the administration of intravenous contrast. COMPARISON:  Renal ultrasound, 10/10/2023 FINDINGS: Lower chest: No acute abnormality. Numerous small pulmonary nodules throughout the lung bases. Hepatobiliary: No obvious solid liver abnormality is seen. Fluid signal cysts and/or hemangiomata of the liver. Gallstones. Gallbladder wall thickening, or biliary dilatation. Pancreas: Unremarkable. No pancreatic ductal dilatation or surrounding inflammatory changes. Spleen: Splenomegaly, maximum span 14.9 cm. Adrenals/Urinary Tract: Adrenal glands are unremarkable. Large mass of the midportion of the right kidney with infiltration into the renal pelvis and renal vein, measuring at least 8.3 x 7.9 x 6.9 cm (series 4, image 29, series 3, image 16). Simple, benign left renal cortical cysts, for which no specific follow-up or characterization is required. No obvious left renal mass. No  obvious calculi or hydronephrosis Stomach/Bowel: Stomach is within normal limits. No evidence of bowel wall thickening, distention, or inflammatory changes. Pancolonic diverticulosis Vascular/Lymphatic: Right renal vein invasion (series 4, image 23). Enlarged right retrocaval lymph node measuring 1.9 x 1.8 cm (series 4, image 21). Other: No abdominal wall hernia or abnormality. No ascites. Musculoskeletal: No acute osseous findings. T2 hyperintense, diffusion restricting body lesions, not clearly characterized by noncontrast MR, for example of T10 (series 4, image 8) IMPRESSION: 1. Large mass of the midportion of the right kidney with infiltration into the renal pelvis and renal vein, measuring at least 8.3 x 7.9 x 6.9 cm. 2. Enlarged right retrocaval lymph node. 3. T2 hyperintense, diffusion restricting body lesions, not clearly characterized by noncontrast MR, but suspicious for vertebral body metastases. Contrast enhanced MRI of the spine would be helpful for more clear assessment. 4.  Numerous small nodules throughout the lung bases, presumed pulmonary metastases. 5. Cholelithiasis. Electronically Signed   By: Marolyn JONETTA Jaksch M.D.   On: 10/11/2023 20:11   US  RENAL Result Date: 10/10/2023 CLINICAL DATA:  [id number].  Evaluation for gross hematuria. EXAM: RENAL / URINARY TRACT ULTRASOUND COMPLETE COMPARISON:  No prior ultrasound. The last CT that included the area was with contrast dated 05/18/2020. FINDINGS: Right Kidney: Renal measurements: 12.8 x 6.9 x 7.7 cm = volume: 354.5 mL. There is a complex, mostly solid partially cystic mass with patchy color flow in the superior pole measuring 5.8 x 5.6 x 6.4 cm. This is considered a renal cell carcinoma until proven otherwise. In 2022 there was an indeterminate 2.3 cm solid lesion in this location which was recommended for MRI follow-up, which if performed was not done in our system. Rest of the cortical echogenicity appears normal. There are several other simple cysts in this kidney in the mid to lower pole. Largest measured is 3 cm exophytic from the lower pole. No intrarenal stones or hydronephrosis are seen. Left Kidney: Renal measurements: 10.8 x 5.7 x 5.8 cm = volume: 185.9 1 mL. The cortical echogenicity of this kidney is increased. Again noted is a 3.2 cm simple cyst arising from the inferior pole, and an adjacent 1.5 cm parapelvic simple cyst. No solid lesion is suspected. No hydronephrosis is seen. In the mid pole of this kidney there is either a 3 mm nonobstructive caliceal stone or renal sinus fat mimicking a stone. There are no other visible stones. Bladder: Catheterized, contracted and obscured by bowel gas. Other: None. IMPRESSION: 1. 5.8 x 5.6 x 6.4 cm complex, mostly solid partially cystic mass with patchy color flow in the superior pole of the right kidney. This is considered a renal cell carcinoma until proven otherwise. MRI or CT with contrast follow-up is recommended. 2. Bilateral simple renal cysts. 3. 3 mm nonobstructive caliceal  stone versus renal sinus fat mimicking a stone in the mid pole of the left kidney. 4. Increased cortical echogenicity of the left kidney which can be seen with medical renal disease. 5. Bladder catheterized, contracted and obscured by bowel gas. Electronically Signed   By: Francis Quam M.D.   On: 10/10/2023 21:21    Microbiology: Results for orders placed or performed during the hospital encounter of 10/10/23  Urine Culture     Status: None   Collection Time: 10/10/23 11:50 AM   Specimen: Urine, Clean Catch  Result Value Ref Range Status   Specimen Description   Final    URINE, CLEAN CATCH Performed at [facility], [address] [place],  [place] [address]    Special Requests   Final    NONE Performed at [facility], [address] [place], [place] [address]    Culture   Final    NO GROWTH Performed at [facility], [address], [place], [place] [address]    Report Status 10/11/2023 FINAL  Final    Labs: CBC: Recent Labs  Lab 10/10/23 1243 10/11/23 0442 10/12/23 1534  WBC 7.7 6.5 6.4  NEUTROABS 5.9  --  4.1  HGB 9.3* 8.7* 8.6*  HCT 28.8* 26.9* 27.9*  MCV 97.0 98.2 99.3  PLT 112* 104* 115*   Basic Metabolic Panel: Recent Labs  Lab 10/10/23 1243 10/11/23 0442 10/12/23 1534 10/13/23 1339  NA 135 138 137 138  K 5.0 4.6 5.2* 5.4*  CL 102 105 106 107  CO2 22 23 23  20*  GLUCOSE 248* 117* 269* 252*  BUN 40* 33* 31* 29*  CREATININE 2.46* 1.68* 2.14* 2.22*  CALCIUM  8.4* 8.4* 8.3* 8.4*   Liver Function Tests: Recent Labs  Lab 10/10/23 1243  AST 12*  ALT 10  ALKPHOS 102  BILITOT 0.6  PROT 6.6  ALBUMIN 3.5   CBG: Recent Labs  Lab 10/13/23 0750 10/13/23 1116 10/13/23 1735 10/13/23 2104 10/14/23 0717  GLUCAP 155* 287* 139* 214* 189*    Discharge time spent: 36 minutes.   Signed: Elgie Butter, MD [facility] 10/14/2023

## 2023-10-24 ENCOUNTER — Ambulatory Visit

## 2023-10-30 ENCOUNTER — Telehealth: Payer: Self-pay | Admitting: Family Medicine

## 2023-10-30 NOTE — Telephone Encounter (Signed)
 Spoke with the [facility].  The hospice doctor has already signed the death certificate.

## 2023-10-30 NOTE — Telephone Encounter (Signed)
 Arland,   I just logged into [employer] to do the death certificate, but it is not there.   Could you call the funeral home and see if they have started that process?  Usually we get an email from the state and the forms are on the computer system for us  to sign.

## 2023-10-30 NOTE — Telephone Encounter (Signed)
 Copied from CRM [id number]. Topic: General - Deceased Patient >> 11/14/2023  8:58 AM Precious C wrote: Name of caller: Quadir Muns (Wife)  Date of death: 09-Nov-2023   Name of funeral home: [employer]  Phone number of funeral home: [phone]  Provider that needs to sign form: Dr. Watt Mirza  Timeline for signing: Unknown

## 2023-11-03 DEATH — deceased

## 2023-11-10 ENCOUNTER — Ambulatory Visit: Admitting: Podiatry

## 2023-11-17 ENCOUNTER — Ambulatory Visit: Payer: PPO | Admitting: Internal Medicine
